# Patient Record
Sex: Male | Born: 1955 | Race: White | Hispanic: No | Marital: Single | Smoking: Former smoker
Health system: Southern US, Community
[De-identification: ages and names within clinical notes are randomized; demographics above are authoritative.]

## PROBLEM LIST (undated history)

## (undated) DIAGNOSIS — I2510 Atherosclerotic heart disease of native coronary artery without angina pectoris: Secondary | ICD-10-CM

## (undated) DIAGNOSIS — F32A Depression, unspecified: Secondary | ICD-10-CM

## (undated) DIAGNOSIS — I208 Other forms of angina pectoris: Secondary | ICD-10-CM

## (undated) DIAGNOSIS — I219 Acute myocardial infarction, unspecified: Secondary | ICD-10-CM

## (undated) DIAGNOSIS — R519 Headache, unspecified: Secondary | ICD-10-CM

## (undated) DIAGNOSIS — J189 Pneumonia, unspecified organism: Secondary | ICD-10-CM

## (undated) DIAGNOSIS — IMO0002 Reserved for concepts with insufficient information to code with codable children: Secondary | ICD-10-CM

## (undated) DIAGNOSIS — J439 Emphysema, unspecified: Secondary | ICD-10-CM

## (undated) DIAGNOSIS — H269 Unspecified cataract: Secondary | ICD-10-CM

## (undated) DIAGNOSIS — M1990 Unspecified osteoarthritis, unspecified site: Secondary | ICD-10-CM

## (undated) DIAGNOSIS — E119 Type 2 diabetes mellitus without complications: Secondary | ICD-10-CM

## (undated) DIAGNOSIS — K219 Gastro-esophageal reflux disease without esophagitis: Secondary | ICD-10-CM

## (undated) DIAGNOSIS — I10 Essential (primary) hypertension: Secondary | ICD-10-CM

## (undated) DIAGNOSIS — T7840XA Allergy, unspecified, initial encounter: Secondary | ICD-10-CM

## (undated) DIAGNOSIS — F329 Major depressive disorder, single episode, unspecified: Secondary | ICD-10-CM

## (undated) DIAGNOSIS — R51 Headache: Secondary | ICD-10-CM

## (undated) DIAGNOSIS — G629 Polyneuropathy, unspecified: Secondary | ICD-10-CM

## (undated) DIAGNOSIS — Z9289 Personal history of other medical treatment: Secondary | ICD-10-CM

## (undated) DIAGNOSIS — N200 Calculus of kidney: Secondary | ICD-10-CM

## (undated) DIAGNOSIS — Z8719 Personal history of other diseases of the digestive system: Secondary | ICD-10-CM

## (undated) DIAGNOSIS — M79673 Pain in unspecified foot: Secondary | ICD-10-CM

## (undated) DIAGNOSIS — J45909 Unspecified asthma, uncomplicated: Secondary | ICD-10-CM

## (undated) DIAGNOSIS — M069 Rheumatoid arthritis, unspecified: Secondary | ICD-10-CM

## (undated) DIAGNOSIS — I2089 Other forms of angina pectoris: Secondary | ICD-10-CM

## (undated) DIAGNOSIS — B20 Human immunodeficiency virus [HIV] disease: Secondary | ICD-10-CM

## (undated) DIAGNOSIS — F419 Anxiety disorder, unspecified: Secondary | ICD-10-CM

## (undated) DIAGNOSIS — E785 Hyperlipidemia, unspecified: Secondary | ICD-10-CM

## (undated) DIAGNOSIS — Z8739 Personal history of other diseases of the musculoskeletal system and connective tissue: Secondary | ICD-10-CM

## (undated) DIAGNOSIS — J449 Chronic obstructive pulmonary disease, unspecified: Secondary | ICD-10-CM

## (undated) DIAGNOSIS — G4730 Sleep apnea, unspecified: Secondary | ICD-10-CM

## (undated) HISTORY — DX: Major depressive disorder, single episode, unspecified: F32.9

## (undated) HISTORY — DX: Rheumatoid arthritis, unspecified: M06.9

## (undated) HISTORY — PX: HERNIA REPAIR: SHX51

## (undated) HISTORY — DX: Unspecified cataract: H26.9

## (undated) HISTORY — DX: Type 2 diabetes mellitus without complications: E11.9

## (undated) HISTORY — DX: Pain in unspecified foot: M79.673

## (undated) HISTORY — DX: Other forms of angina pectoris: I20.89

## (undated) HISTORY — DX: Emphysema, unspecified: J43.9

## (undated) HISTORY — DX: Human immunodeficiency virus (HIV) disease: B20

## (undated) HISTORY — DX: Allergy, unspecified, initial encounter: T78.40XA

## (undated) HISTORY — DX: Polyneuropathy, unspecified: G62.9

## (undated) HISTORY — PX: APPENDECTOMY: SHX54

## (undated) HISTORY — DX: Essential (primary) hypertension: I10

## (undated) HISTORY — DX: Depression, unspecified: F32.A

## (undated) HISTORY — DX: Other forms of angina pectoris: I20.8

## (undated) HISTORY — DX: Hyperlipidemia, unspecified: E78.5

## (undated) HISTORY — DX: Reserved for concepts with insufficient information to code with codable children: IMO0002

## (undated) HISTORY — DX: Personal history of other medical treatment: Z92.89

## (undated) HISTORY — DX: Acute myocardial infarction, unspecified: I21.9

---

## 1979-05-30 HISTORY — PX: UMBILICAL HERNIA REPAIR: SHX196

## 1989-05-29 HISTORY — PX: CARPAL TUNNEL RELEASE: SHX101

## 1989-05-29 HISTORY — PX: TONSILLECTOMY AND ADENOIDECTOMY: SUR1326

## 1989-05-29 HISTORY — PX: TRIGGER FINGER RELEASE: SHX641

## 1989-05-29 HISTORY — PX: LAPAROSCOPIC INCISIONAL / UMBILICAL / VENTRAL HERNIA REPAIR: SUR789

## 1989-05-29 HISTORY — PX: UVULOPALATOPHARYNGOPLASTY (UPPP)/TONSILLECTOMY/SEPTOPLASTY: SHX6164

## 1993-09-28 DIAGNOSIS — Z21 Asymptomatic human immunodeficiency virus [HIV] infection status: Secondary | ICD-10-CM

## 1993-09-28 DIAGNOSIS — B20 Human immunodeficiency virus [HIV] disease: Secondary | ICD-10-CM

## 1993-09-28 HISTORY — DX: Human immunodeficiency virus (HIV) disease: B20

## 1993-09-28 HISTORY — DX: Asymptomatic human immunodeficiency virus (hiv) infection status: Z21

## 1994-10-23 ENCOUNTER — Encounter (INDEPENDENT_AMBULATORY_CARE_PROVIDER_SITE_OTHER): Payer: Self-pay | Admitting: *Deleted

## 1994-10-23 ENCOUNTER — Encounter: Payer: Self-pay | Admitting: Infectious Diseases

## 1994-10-23 LAB — CONVERTED CEMR LAB
CD4 Count: 21 microliters
CD4 T Cell Abs: 21

## 1998-11-07 ENCOUNTER — Encounter: Admission: RE | Admit: 1998-11-07 | Discharge: 1998-11-07 | Payer: Self-pay | Admitting: Internal Medicine

## 1998-11-07 ENCOUNTER — Ambulatory Visit (HOSPITAL_COMMUNITY): Admission: RE | Admit: 1998-11-07 | Discharge: 1998-11-07 | Payer: Self-pay | Admitting: Internal Medicine

## 1998-12-16 ENCOUNTER — Encounter: Admission: RE | Admit: 1998-12-16 | Discharge: 1998-12-16 | Payer: Self-pay | Admitting: Internal Medicine

## 1999-04-03 ENCOUNTER — Ambulatory Visit (HOSPITAL_COMMUNITY): Admission: RE | Admit: 1999-04-03 | Discharge: 1999-04-03 | Payer: Self-pay | Admitting: Internal Medicine

## 1999-04-03 ENCOUNTER — Encounter: Admission: RE | Admit: 1999-04-03 | Discharge: 1999-04-03 | Payer: Self-pay | Admitting: Internal Medicine

## 1999-07-10 ENCOUNTER — Encounter: Admission: RE | Admit: 1999-07-10 | Discharge: 1999-07-10 | Payer: Self-pay | Admitting: Hematology and Oncology

## 1999-07-10 ENCOUNTER — Ambulatory Visit (HOSPITAL_COMMUNITY): Admission: RE | Admit: 1999-07-10 | Discharge: 1999-07-10 | Payer: Self-pay | Admitting: Internal Medicine

## 1999-08-07 ENCOUNTER — Encounter: Admission: RE | Admit: 1999-08-07 | Discharge: 1999-08-07 | Payer: Self-pay | Admitting: Internal Medicine

## 1999-08-11 ENCOUNTER — Ambulatory Visit (HOSPITAL_COMMUNITY): Admission: RE | Admit: 1999-08-11 | Discharge: 1999-08-11 | Payer: Self-pay | Admitting: *Deleted

## 1999-08-11 ENCOUNTER — Encounter: Payer: Self-pay | Admitting: *Deleted

## 1999-10-02 ENCOUNTER — Encounter: Admission: RE | Admit: 1999-10-02 | Discharge: 1999-10-02 | Payer: Self-pay | Admitting: Hematology and Oncology

## 1999-10-02 ENCOUNTER — Ambulatory Visit (HOSPITAL_COMMUNITY): Admission: RE | Admit: 1999-10-02 | Discharge: 1999-10-02 | Payer: Self-pay | Admitting: Hematology and Oncology

## 1999-11-13 ENCOUNTER — Encounter: Admission: RE | Admit: 1999-11-13 | Discharge: 1999-11-13 | Payer: Self-pay | Admitting: Internal Medicine

## 2000-01-23 ENCOUNTER — Encounter: Admission: RE | Admit: 2000-01-23 | Discharge: 2000-01-23 | Payer: Self-pay | Admitting: Internal Medicine

## 2000-01-23 ENCOUNTER — Ambulatory Visit (HOSPITAL_COMMUNITY): Admission: RE | Admit: 2000-01-23 | Discharge: 2000-01-23 | Payer: Self-pay | Admitting: Internal Medicine

## 2000-03-29 ENCOUNTER — Encounter: Admission: RE | Admit: 2000-03-29 | Discharge: 2000-03-29 | Payer: Self-pay

## 2000-05-10 ENCOUNTER — Ambulatory Visit (HOSPITAL_COMMUNITY): Admission: RE | Admit: 2000-05-10 | Discharge: 2000-05-10 | Payer: Self-pay | Admitting: Internal Medicine

## 2000-05-10 ENCOUNTER — Encounter: Admission: RE | Admit: 2000-05-10 | Discharge: 2000-05-10 | Payer: Self-pay | Admitting: Internal Medicine

## 2000-09-02 ENCOUNTER — Ambulatory Visit (HOSPITAL_COMMUNITY): Admission: RE | Admit: 2000-09-02 | Discharge: 2000-09-02 | Payer: Self-pay | Admitting: Hematology and Oncology

## 2000-09-02 ENCOUNTER — Encounter: Admission: RE | Admit: 2000-09-02 | Discharge: 2000-09-02 | Payer: Self-pay | Admitting: Hematology and Oncology

## 2001-01-03 ENCOUNTER — Encounter: Admission: RE | Admit: 2001-01-03 | Discharge: 2001-01-03 | Payer: Self-pay | Admitting: Internal Medicine

## 2001-01-03 ENCOUNTER — Ambulatory Visit (HOSPITAL_COMMUNITY): Admission: RE | Admit: 2001-01-03 | Discharge: 2001-01-03 | Payer: Self-pay | Admitting: Internal Medicine

## 2001-05-11 ENCOUNTER — Encounter: Admission: RE | Admit: 2001-05-11 | Discharge: 2001-05-11 | Payer: Self-pay | Admitting: Internal Medicine

## 2001-05-11 ENCOUNTER — Ambulatory Visit (HOSPITAL_COMMUNITY): Admission: RE | Admit: 2001-05-11 | Discharge: 2001-05-11 | Payer: Self-pay | Admitting: Internal Medicine

## 2001-06-10 ENCOUNTER — Encounter: Admission: RE | Admit: 2001-06-10 | Discharge: 2001-06-10 | Payer: Self-pay | Admitting: Internal Medicine

## 2001-09-05 ENCOUNTER — Encounter: Admission: RE | Admit: 2001-09-05 | Discharge: 2001-09-05 | Payer: Self-pay | Admitting: Internal Medicine

## 2001-11-03 ENCOUNTER — Ambulatory Visit (HOSPITAL_COMMUNITY): Admission: RE | Admit: 2001-11-03 | Discharge: 2001-11-03 | Payer: Self-pay | Admitting: Internal Medicine

## 2001-11-03 ENCOUNTER — Encounter: Admission: RE | Admit: 2001-11-03 | Discharge: 2001-11-03 | Payer: Self-pay | Admitting: Internal Medicine

## 2001-11-10 ENCOUNTER — Ambulatory Visit (HOSPITAL_COMMUNITY): Admission: RE | Admit: 2001-11-10 | Discharge: 2001-11-10 | Payer: Self-pay | Admitting: Cardiovascular Disease

## 2001-11-10 ENCOUNTER — Encounter: Payer: Self-pay | Admitting: Cardiovascular Disease

## 2001-12-23 ENCOUNTER — Encounter: Payer: Self-pay | Admitting: Surgery

## 2001-12-23 ENCOUNTER — Ambulatory Visit (HOSPITAL_COMMUNITY): Admission: RE | Admit: 2001-12-23 | Discharge: 2001-12-23 | Payer: Self-pay | Admitting: Surgery

## 2001-12-29 ENCOUNTER — Encounter: Admission: RE | Admit: 2001-12-29 | Discharge: 2001-12-29 | Payer: Self-pay

## 2002-01-25 ENCOUNTER — Encounter: Payer: Self-pay | Admitting: Surgery

## 2002-01-30 ENCOUNTER — Ambulatory Visit (HOSPITAL_COMMUNITY): Admission: RE | Admit: 2002-01-30 | Discharge: 2002-01-30 | Payer: Self-pay | Admitting: Surgery

## 2002-05-16 ENCOUNTER — Encounter: Payer: Self-pay | Admitting: Internal Medicine

## 2002-05-16 ENCOUNTER — Encounter: Admission: RE | Admit: 2002-05-16 | Discharge: 2002-05-16 | Payer: Self-pay | Admitting: Internal Medicine

## 2002-05-16 ENCOUNTER — Ambulatory Visit (HOSPITAL_COMMUNITY): Admission: RE | Admit: 2002-05-16 | Discharge: 2002-05-16 | Payer: Self-pay | Admitting: Internal Medicine

## 2002-09-12 ENCOUNTER — Encounter: Admission: RE | Admit: 2002-09-12 | Discharge: 2002-09-12 | Payer: Self-pay | Admitting: Internal Medicine

## 2002-09-12 ENCOUNTER — Ambulatory Visit (HOSPITAL_COMMUNITY): Admission: RE | Admit: 2002-09-12 | Discharge: 2002-09-12 | Payer: Self-pay | Admitting: Internal Medicine

## 2002-12-11 ENCOUNTER — Encounter: Admission: RE | Admit: 2002-12-11 | Discharge: 2002-12-11 | Payer: Self-pay | Admitting: Internal Medicine

## 2002-12-25 ENCOUNTER — Encounter: Admission: RE | Admit: 2002-12-25 | Discharge: 2002-12-25 | Payer: Self-pay | Admitting: Infectious Diseases

## 2003-02-12 ENCOUNTER — Encounter: Admission: RE | Admit: 2003-02-12 | Discharge: 2003-02-12 | Payer: Self-pay | Admitting: Infectious Diseases

## 2003-06-06 ENCOUNTER — Encounter (INDEPENDENT_AMBULATORY_CARE_PROVIDER_SITE_OTHER): Payer: Self-pay | Admitting: Infectious Diseases

## 2003-06-06 ENCOUNTER — Ambulatory Visit (HOSPITAL_COMMUNITY): Admission: RE | Admit: 2003-06-06 | Discharge: 2003-06-06 | Payer: Self-pay | Admitting: Infectious Diseases

## 2003-06-06 ENCOUNTER — Encounter: Admission: RE | Admit: 2003-06-06 | Discharge: 2003-06-06 | Payer: Self-pay | Admitting: Infectious Diseases

## 2003-06-27 ENCOUNTER — Encounter (INDEPENDENT_AMBULATORY_CARE_PROVIDER_SITE_OTHER): Payer: Self-pay | Admitting: Infectious Diseases

## 2003-06-27 ENCOUNTER — Encounter: Admission: RE | Admit: 2003-06-27 | Discharge: 2003-06-27 | Payer: Self-pay | Admitting: Infectious Diseases

## 2003-06-27 ENCOUNTER — Ambulatory Visit (HOSPITAL_COMMUNITY): Admission: RE | Admit: 2003-06-27 | Discharge: 2003-06-27 | Payer: Self-pay | Admitting: Infectious Diseases

## 2003-07-17 ENCOUNTER — Encounter: Admission: RE | Admit: 2003-07-17 | Discharge: 2003-07-17 | Payer: Self-pay | Admitting: Infectious Diseases

## 2003-08-15 ENCOUNTER — Encounter: Admission: RE | Admit: 2003-08-15 | Discharge: 2003-08-15 | Payer: Self-pay | Admitting: Internal Medicine

## 2003-08-15 ENCOUNTER — Ambulatory Visit (HOSPITAL_COMMUNITY): Admission: RE | Admit: 2003-08-15 | Discharge: 2003-08-15 | Payer: Self-pay | Admitting: Internal Medicine

## 2003-09-07 ENCOUNTER — Encounter (INDEPENDENT_AMBULATORY_CARE_PROVIDER_SITE_OTHER): Payer: Self-pay | Admitting: Infectious Diseases

## 2003-09-07 ENCOUNTER — Ambulatory Visit (HOSPITAL_COMMUNITY): Admission: RE | Admit: 2003-09-07 | Discharge: 2003-09-07 | Payer: Self-pay | Admitting: Infectious Diseases

## 2003-09-07 ENCOUNTER — Encounter: Admission: RE | Admit: 2003-09-07 | Discharge: 2003-09-07 | Payer: Self-pay | Admitting: Infectious Diseases

## 2003-09-19 ENCOUNTER — Encounter: Admission: RE | Admit: 2003-09-19 | Discharge: 2003-09-19 | Payer: Self-pay | Admitting: Infectious Diseases

## 2003-11-27 ENCOUNTER — Ambulatory Visit (HOSPITAL_COMMUNITY): Admission: RE | Admit: 2003-11-27 | Discharge: 2003-11-27 | Payer: Self-pay | Admitting: Infectious Diseases

## 2003-11-27 ENCOUNTER — Encounter: Admission: RE | Admit: 2003-11-27 | Discharge: 2003-11-27 | Payer: Self-pay | Admitting: Infectious Diseases

## 2003-12-11 ENCOUNTER — Encounter: Admission: RE | Admit: 2003-12-11 | Discharge: 2003-12-11 | Payer: Self-pay | Admitting: Infectious Diseases

## 2004-03-10 ENCOUNTER — Encounter: Admission: RE | Admit: 2004-03-10 | Discharge: 2004-03-10 | Payer: Self-pay | Admitting: Infectious Diseases

## 2004-03-10 ENCOUNTER — Ambulatory Visit (HOSPITAL_COMMUNITY): Admission: RE | Admit: 2004-03-10 | Discharge: 2004-03-10 | Payer: Self-pay | Admitting: Infectious Diseases

## 2004-03-18 ENCOUNTER — Encounter: Admission: RE | Admit: 2004-03-18 | Discharge: 2004-06-16 | Payer: Self-pay | Admitting: Infectious Diseases

## 2004-04-02 ENCOUNTER — Encounter: Admission: RE | Admit: 2004-04-02 | Discharge: 2004-04-02 | Payer: Self-pay | Admitting: Infectious Diseases

## 2004-04-23 ENCOUNTER — Encounter: Admission: RE | Admit: 2004-04-23 | Discharge: 2004-04-23 | Payer: Self-pay | Admitting: Infectious Diseases

## 2004-04-23 ENCOUNTER — Ambulatory Visit (HOSPITAL_COMMUNITY): Admission: RE | Admit: 2004-04-23 | Discharge: 2004-04-23 | Payer: Self-pay | Admitting: Infectious Diseases

## 2004-05-05 ENCOUNTER — Encounter: Admission: RE | Admit: 2004-05-05 | Discharge: 2004-05-05 | Payer: Self-pay | Admitting: Internal Medicine

## 2004-05-12 ENCOUNTER — Encounter: Admission: RE | Admit: 2004-05-12 | Discharge: 2004-05-12 | Payer: Self-pay | Admitting: Internal Medicine

## 2004-05-13 ENCOUNTER — Encounter: Admission: RE | Admit: 2004-05-13 | Discharge: 2004-05-13 | Payer: Self-pay | Admitting: Internal Medicine

## 2004-06-23 ENCOUNTER — Ambulatory Visit (HOSPITAL_COMMUNITY): Admission: RE | Admit: 2004-06-23 | Discharge: 2004-06-23 | Payer: Self-pay | Admitting: Infectious Diseases

## 2004-06-23 ENCOUNTER — Ambulatory Visit: Payer: Self-pay | Admitting: Infectious Diseases

## 2004-07-21 ENCOUNTER — Ambulatory Visit: Payer: Self-pay | Admitting: Infectious Diseases

## 2004-09-26 ENCOUNTER — Emergency Department (HOSPITAL_COMMUNITY): Admission: EM | Admit: 2004-09-26 | Discharge: 2004-09-26 | Payer: Self-pay | Admitting: Family Medicine

## 2004-09-30 ENCOUNTER — Ambulatory Visit: Payer: Self-pay | Admitting: Infectious Diseases

## 2004-09-30 ENCOUNTER — Ambulatory Visit (HOSPITAL_COMMUNITY): Admission: RE | Admit: 2004-09-30 | Discharge: 2004-09-30 | Payer: Self-pay | Admitting: Infectious Diseases

## 2004-10-13 ENCOUNTER — Ambulatory Visit: Payer: Self-pay | Admitting: Infectious Diseases

## 2005-01-05 ENCOUNTER — Ambulatory Visit (HOSPITAL_COMMUNITY): Admission: RE | Admit: 2005-01-05 | Discharge: 2005-01-05 | Payer: Self-pay | Admitting: Infectious Diseases

## 2005-01-05 ENCOUNTER — Ambulatory Visit: Payer: Self-pay | Admitting: Infectious Diseases

## 2005-02-09 ENCOUNTER — Ambulatory Visit: Payer: Self-pay | Admitting: Infectious Diseases

## 2005-03-11 ENCOUNTER — Ambulatory Visit: Payer: Self-pay | Admitting: Internal Medicine

## 2005-04-13 ENCOUNTER — Ambulatory Visit: Payer: Self-pay | Admitting: Internal Medicine

## 2005-04-13 ENCOUNTER — Ambulatory Visit (HOSPITAL_COMMUNITY): Admission: RE | Admit: 2005-04-13 | Discharge: 2005-04-13 | Payer: Self-pay | Admitting: Internal Medicine

## 2005-04-20 ENCOUNTER — Ambulatory Visit: Payer: Self-pay | Admitting: Internal Medicine

## 2005-05-25 ENCOUNTER — Ambulatory Visit: Payer: Self-pay | Admitting: Infectious Diseases

## 2005-05-25 ENCOUNTER — Ambulatory Visit (HOSPITAL_COMMUNITY): Admission: RE | Admit: 2005-05-25 | Discharge: 2005-05-25 | Payer: Self-pay | Admitting: Infectious Diseases

## 2005-06-15 ENCOUNTER — Ambulatory Visit: Payer: Self-pay | Admitting: Infectious Diseases

## 2005-08-06 ENCOUNTER — Ambulatory Visit: Payer: Self-pay | Admitting: Internal Medicine

## 2005-08-06 ENCOUNTER — Ambulatory Visit (HOSPITAL_COMMUNITY): Admission: RE | Admit: 2005-08-06 | Discharge: 2005-08-06 | Payer: Self-pay | Admitting: Internal Medicine

## 2005-08-26 ENCOUNTER — Ambulatory Visit: Payer: Self-pay | Admitting: Internal Medicine

## 2005-09-09 ENCOUNTER — Ambulatory Visit (HOSPITAL_COMMUNITY): Admission: RE | Admit: 2005-09-09 | Discharge: 2005-09-09 | Payer: Self-pay | Admitting: Internal Medicine

## 2005-09-09 ENCOUNTER — Ambulatory Visit: Payer: Self-pay | Admitting: Internal Medicine

## 2005-10-01 ENCOUNTER — Ambulatory Visit: Payer: Self-pay | Admitting: Infectious Diseases

## 2005-10-19 ENCOUNTER — Ambulatory Visit: Payer: Self-pay | Admitting: Infectious Diseases

## 2005-11-24 ENCOUNTER — Encounter (INDEPENDENT_AMBULATORY_CARE_PROVIDER_SITE_OTHER): Payer: Self-pay | Admitting: *Deleted

## 2005-11-24 ENCOUNTER — Ambulatory Visit (HOSPITAL_BASED_OUTPATIENT_CLINIC_OR_DEPARTMENT_OTHER): Admission: RE | Admit: 2005-11-24 | Discharge: 2005-11-24 | Payer: Self-pay | Admitting: Orthopedic Surgery

## 2005-12-11 ENCOUNTER — Ambulatory Visit: Payer: Self-pay | Admitting: Internal Medicine

## 2005-12-18 ENCOUNTER — Ambulatory Visit: Payer: Self-pay | Admitting: Internal Medicine

## 2005-12-25 ENCOUNTER — Ambulatory Visit: Payer: Self-pay | Admitting: Internal Medicine

## 2006-01-05 ENCOUNTER — Ambulatory Visit: Payer: Self-pay | Admitting: Infectious Diseases

## 2006-01-05 ENCOUNTER — Encounter: Admission: RE | Admit: 2006-01-05 | Discharge: 2006-01-05 | Payer: Self-pay | Admitting: Infectious Diseases

## 2006-01-05 ENCOUNTER — Encounter (INDEPENDENT_AMBULATORY_CARE_PROVIDER_SITE_OTHER): Payer: Self-pay | Admitting: *Deleted

## 2006-01-05 LAB — CONVERTED CEMR LAB: CD4 Count: 450 microliters

## 2006-01-18 ENCOUNTER — Ambulatory Visit: Payer: Self-pay | Admitting: Infectious Diseases

## 2006-03-02 ENCOUNTER — Ambulatory Visit: Payer: Self-pay | Admitting: Internal Medicine

## 2006-03-30 ENCOUNTER — Ambulatory Visit: Payer: Self-pay | Admitting: Internal Medicine

## 2006-05-18 ENCOUNTER — Ambulatory Visit: Payer: Self-pay | Admitting: Infectious Diseases

## 2006-05-18 ENCOUNTER — Encounter (INDEPENDENT_AMBULATORY_CARE_PROVIDER_SITE_OTHER): Payer: Self-pay | Admitting: *Deleted

## 2006-05-18 ENCOUNTER — Encounter: Admission: RE | Admit: 2006-05-18 | Discharge: 2006-05-18 | Payer: Self-pay | Admitting: Infectious Diseases

## 2006-05-18 LAB — CONVERTED CEMR LAB
CD4 Count: 590 microliters
HIV 1 RNA Quant: 681 copies/mL

## 2006-06-07 ENCOUNTER — Ambulatory Visit: Payer: Self-pay | Admitting: Infectious Diseases

## 2006-06-07 ENCOUNTER — Encounter (INDEPENDENT_AMBULATORY_CARE_PROVIDER_SITE_OTHER): Payer: Self-pay | Admitting: *Deleted

## 2006-06-07 LAB — CONVERTED CEMR LAB: HIV 1 RNA Quant: 129 copies/mL

## 2006-06-24 ENCOUNTER — Ambulatory Visit: Payer: Self-pay | Admitting: Infectious Diseases

## 2006-07-08 DIAGNOSIS — I10 Essential (primary) hypertension: Secondary | ICD-10-CM | POA: Insufficient documentation

## 2006-07-08 DIAGNOSIS — F329 Major depressive disorder, single episode, unspecified: Secondary | ICD-10-CM

## 2006-07-08 DIAGNOSIS — F32A Depression, unspecified: Secondary | ICD-10-CM | POA: Insufficient documentation

## 2006-07-08 DIAGNOSIS — B20 Human immunodeficiency virus [HIV] disease: Secondary | ICD-10-CM | POA: Insufficient documentation

## 2006-07-08 DIAGNOSIS — E1169 Type 2 diabetes mellitus with other specified complication: Secondary | ICD-10-CM

## 2006-07-08 DIAGNOSIS — G5600 Carpal tunnel syndrome, unspecified upper limb: Secondary | ICD-10-CM | POA: Insufficient documentation

## 2006-07-08 DIAGNOSIS — E119 Type 2 diabetes mellitus without complications: Secondary | ICD-10-CM | POA: Insufficient documentation

## 2006-07-08 DIAGNOSIS — E1142 Type 2 diabetes mellitus with diabetic polyneuropathy: Secondary | ICD-10-CM | POA: Insufficient documentation

## 2006-07-12 ENCOUNTER — Ambulatory Visit: Payer: Self-pay | Admitting: Infectious Diseases

## 2006-08-05 ENCOUNTER — Ambulatory Visit: Payer: Self-pay | Admitting: Infectious Diseases

## 2006-08-05 LAB — CONVERTED CEMR LAB
ALT: 85 units/L — ABNORMAL HIGH (ref 0–53)
AST: 37 units/L (ref 0–37)
Albumin: 4.4 g/dL (ref 3.5–5.2)
Alkaline Phosphatase: 95 units/L (ref 39–117)
BUN: 15 mg/dL (ref 6–23)
CO2: 28 meq/L (ref 19–32)
Calcium: 9.6 mg/dL (ref 8.4–10.5)
Chloride: 99 meq/L (ref 96–112)
Creatinine, Ser: 0.9 mg/dL (ref 0.40–1.50)
Glucose, Bld: 136 mg/dL — ABNORMAL HIGH (ref 70–99)
HCT: 43.1 % (ref 41.0–49.0)
Hemoglobin: 14.8 g/dL (ref 13.9–16.8)
Leukocyte count, blood: 6.8 10*9/L (ref 3.7–10.0)
MCHC: 34.3 g/dL (ref 33.1–35.4)
MCV: 96.2 fL (ref 78.8–100.0)
Platelets: 302 10*3/uL (ref 152–374)
Potassium: 4.3 meq/L (ref 3.5–5.3)
RBC: 4.48 M/uL (ref 4.20–5.50)
RDW: 13.9 % (ref 11.5–15.3)
Sodium: 139 meq/L (ref 135–145)
Total Bilirubin: 0.5 mg/dL (ref 0.3–1.2)
Total Protein: 6.9 g/dL (ref 6.0–8.3)

## 2006-08-11 ENCOUNTER — Ambulatory Visit: Payer: Self-pay | Admitting: Hospitalist

## 2006-09-24 ENCOUNTER — Ambulatory Visit: Payer: Self-pay | Admitting: Internal Medicine

## 2006-09-30 ENCOUNTER — Encounter (INDEPENDENT_AMBULATORY_CARE_PROVIDER_SITE_OTHER): Payer: Self-pay | Admitting: Infectious Diseases

## 2006-10-11 DIAGNOSIS — E7800 Pure hypercholesterolemia, unspecified: Secondary | ICD-10-CM | POA: Insufficient documentation

## 2006-10-11 DIAGNOSIS — M069 Rheumatoid arthritis, unspecified: Secondary | ICD-10-CM | POA: Insufficient documentation

## 2006-10-12 ENCOUNTER — Encounter: Admission: RE | Admit: 2006-10-12 | Discharge: 2006-10-12 | Payer: Self-pay | Admitting: Infectious Diseases

## 2006-10-12 ENCOUNTER — Encounter (INDEPENDENT_AMBULATORY_CARE_PROVIDER_SITE_OTHER): Payer: Self-pay | Admitting: *Deleted

## 2006-10-12 ENCOUNTER — Ambulatory Visit: Payer: Self-pay | Admitting: Infectious Diseases

## 2006-10-12 ENCOUNTER — Ambulatory Visit: Payer: Self-pay | Admitting: Internal Medicine

## 2006-10-12 DIAGNOSIS — M546 Pain in thoracic spine: Secondary | ICD-10-CM | POA: Insufficient documentation

## 2006-10-12 DIAGNOSIS — F17200 Nicotine dependence, unspecified, uncomplicated: Secondary | ICD-10-CM | POA: Insufficient documentation

## 2006-10-12 LAB — CONVERTED CEMR LAB
ALT: 54 units/L — ABNORMAL HIGH (ref 0–53)
AST: 30 units/L (ref 0–37)
Albumin: 4.3 g/dL (ref 3.5–5.2)
Alkaline Phosphatase: 72 units/L (ref 39–117)
BUN: 13 mg/dL (ref 6–23)
Basophils Absolute: 0 10*3/uL (ref 0.0–0.1)
Basophils Relative: 1 % (ref 0–1)
CD4 Count: 550 microliters
CO2: 24 meq/L (ref 19–32)
Calcium: 9.3 mg/dL (ref 8.4–10.5)
Chloride: 107 meq/L (ref 96–112)
Cholesterol: 180 mg/dL (ref 0–200)
Creatinine, Ser: 0.79 mg/dL (ref 0.40–1.50)
Creatinine, Urine: 193.2 mg/dL
Eosinophils Relative: 3 % (ref 0–5)
Glucose, Bld: 89 mg/dL
Glucose, Bld: 89 mg/dL (ref 70–99)
HCT: 44.3 % (ref 39.0–52.0)
HDL: 46 mg/dL (ref >39)
HIV 1 RNA Quant: 273 copies/mL
HIV 1 RNA Quant: 273 copies/mL — ABNORMAL HIGH (ref <50)
HIV-1 RNA Quant, Log: 2.44 — ABNORMAL HIGH (ref <1.70)
Hemoglobin, Urine: NEGATIVE
Hemoglobin: 14.6 g/dL (ref 13.0–17.0)
Hgb A1c MFr Bld: 5.7 %
LDL Cholesterol: 111 mg/dL — ABNORMAL HIGH (ref 0–99)
Leukocytes, UA: NEGATIVE
Lymphocytes Relative: 25 % (ref 12–46)
Lymphs Abs: 1.6 10*3/uL (ref 0.7–3.3)
MCHC: 33 g/dL (ref 30.0–36.0)
MCV: 98.2 fL (ref 78.0–100.0)
Microalb Creat Ratio: 4.4 mg/g (ref 0.0–30.0)
Microalb, Ur: 0.85 mg/dL (ref 0.00–1.89)
Monocytes Absolute: 0.5 10*3/uL (ref 0.2–0.7)
Monocytes Relative: 8 % (ref 3–11)
Neutro Abs: 4.1 10*3/uL (ref 1.7–7.7)
Neutrophils Relative %: 64 % (ref 43–77)
Nitrite: NEGATIVE
PSA: 0.5 ng/mL (ref 0.10–4.00)
Platelets: 298 10*3/uL (ref 150–400)
Potassium: 4 meq/L (ref 3.5–5.3)
Protein, ur: NEGATIVE mg/dL
RBC: 4.51 M/uL (ref 4.22–5.81)
RDW: 13.7 % (ref 11.5–14.0)
Sodium: 143 meq/L (ref 135–145)
Specific Gravity, Urine: 1.027 (ref 1.005–1.03)
TSH: 0.564 microintl units/mL (ref 0.350–5.50)
Testosterone: 344.85 ng/dL — ABNORMAL LOW (ref 350–890)
Total Bilirubin: 0.6 mg/dL (ref 0.3–1.2)
Total CHOL/HDL Ratio: 3.9
Total Protein: 6.6 g/dL (ref 6.0–8.3)
Triglycerides: 115 mg/dL (ref <150)
Urine Glucose: NEGATIVE mg/dL
Urobilinogen, UA: 1 (ref 0.0–1.0)
VLDL: 23 mg/dL (ref 0–40)
WBC: 6.4 10*3/uL (ref 4.0–10.5)
pH: 6.5 (ref 5.0–8.0)

## 2006-10-25 ENCOUNTER — Ambulatory Visit: Payer: Self-pay | Admitting: Infectious Diseases

## 2006-11-22 ENCOUNTER — Encounter (INDEPENDENT_AMBULATORY_CARE_PROVIDER_SITE_OTHER): Payer: Self-pay | Admitting: *Deleted

## 2006-11-22 LAB — CONVERTED CEMR LAB

## 2006-11-30 ENCOUNTER — Encounter (INDEPENDENT_AMBULATORY_CARE_PROVIDER_SITE_OTHER): Payer: Self-pay | Admitting: *Deleted

## 2006-12-05 ENCOUNTER — Encounter (INDEPENDENT_AMBULATORY_CARE_PROVIDER_SITE_OTHER): Payer: Self-pay | Admitting: *Deleted

## 2006-12-28 ENCOUNTER — Telehealth (INDEPENDENT_AMBULATORY_CARE_PROVIDER_SITE_OTHER): Payer: Self-pay | Admitting: Infectious Diseases

## 2007-01-04 ENCOUNTER — Telehealth (INDEPENDENT_AMBULATORY_CARE_PROVIDER_SITE_OTHER): Payer: Self-pay | Admitting: *Deleted

## 2007-01-19 ENCOUNTER — Ambulatory Visit: Payer: Self-pay | Admitting: Hospitalist

## 2007-01-19 DIAGNOSIS — T148 Other injury of unspecified body region: Secondary | ICD-10-CM | POA: Insufficient documentation

## 2007-01-19 DIAGNOSIS — W57XXXA Bitten or stung by nonvenomous insect and other nonvenomous arthropods, initial encounter: Secondary | ICD-10-CM

## 2007-01-19 LAB — CONVERTED CEMR LAB
Blood Glucose, Fingerstick: 100
Hgb A1c MFr Bld: 5.7 %

## 2007-01-27 ENCOUNTER — Telehealth (INDEPENDENT_AMBULATORY_CARE_PROVIDER_SITE_OTHER): Payer: Self-pay | Admitting: Infectious Diseases

## 2007-02-11 ENCOUNTER — Telehealth (INDEPENDENT_AMBULATORY_CARE_PROVIDER_SITE_OTHER): Payer: Self-pay | Admitting: Infectious Diseases

## 2007-02-16 ENCOUNTER — Encounter: Admission: RE | Admit: 2007-02-16 | Discharge: 2007-02-16 | Payer: Self-pay | Admitting: Infectious Diseases

## 2007-02-16 ENCOUNTER — Ambulatory Visit: Payer: Self-pay | Admitting: Infectious Diseases

## 2007-02-28 ENCOUNTER — Telehealth (INDEPENDENT_AMBULATORY_CARE_PROVIDER_SITE_OTHER): Payer: Self-pay | Admitting: Infectious Diseases

## 2007-03-02 ENCOUNTER — Ambulatory Visit: Payer: Self-pay | Admitting: Infectious Diseases

## 2007-03-08 ENCOUNTER — Telehealth (INDEPENDENT_AMBULATORY_CARE_PROVIDER_SITE_OTHER): Payer: Self-pay | Admitting: Infectious Diseases

## 2007-03-24 ENCOUNTER — Telehealth (INDEPENDENT_AMBULATORY_CARE_PROVIDER_SITE_OTHER): Payer: Self-pay | Admitting: *Deleted

## 2007-04-11 ENCOUNTER — Telehealth: Payer: Self-pay | Admitting: Internal Medicine

## 2007-04-22 ENCOUNTER — Telehealth: Payer: Self-pay | Admitting: Internal Medicine

## 2007-05-10 ENCOUNTER — Telehealth: Payer: Self-pay | Admitting: Internal Medicine

## 2007-06-10 ENCOUNTER — Telehealth: Payer: Self-pay | Admitting: Internal Medicine

## 2007-06-24 ENCOUNTER — Telehealth: Payer: Self-pay | Admitting: Internal Medicine

## 2007-07-05 ENCOUNTER — Encounter (INDEPENDENT_AMBULATORY_CARE_PROVIDER_SITE_OTHER): Payer: Self-pay | Admitting: *Deleted

## 2007-07-05 ENCOUNTER — Encounter: Admission: RE | Admit: 2007-07-05 | Discharge: 2007-07-05 | Payer: Self-pay | Admitting: Infectious Diseases

## 2007-07-05 ENCOUNTER — Ambulatory Visit: Payer: Self-pay | Admitting: Infectious Diseases

## 2007-07-05 LAB — CONVERTED CEMR LAB
BUN: 14 mg/dL (ref 6–23)
CO2: 22 meq/L (ref 19–32)
Creatinine, Ser: 0.83 mg/dL (ref 0.40–1.50)
Eosinophils Absolute: 0.3 10*3/uL (ref 0.0–0.7)
Eosinophils Relative: 4 % (ref 0–5)
Glucose, Bld: 134 mg/dL — ABNORMAL HIGH (ref 70–99)
HCT: 45.9 % (ref 39.0–52.0)
HIV 1 RNA Quant: 280 copies/mL — ABNORMAL HIGH (ref <50)
HIV-1 RNA Quant, Log: 2.45 — ABNORMAL HIGH (ref <1.70)
Hemoglobin: 15.3 g/dL (ref 13.0–17.0)
Hgb A1c MFr Bld: 5.4 %
Lymphocytes Relative: 23 % (ref 12–46)
Lymphs Abs: 1.5 10*3/uL (ref 0.7–3.3)
MCV: 95.6 fL (ref 78.0–100.0)
Monocytes Absolute: 0.4 10*3/uL (ref 0.2–0.7)
Monocytes Relative: 7 % (ref 3–11)
Total Bilirubin: 0.6 mg/dL (ref 0.3–1.2)
Total Protein: 6.5 g/dL (ref 6.0–8.3)
WBC: 6.7 10*3/uL (ref 4.0–10.5)

## 2007-07-07 ENCOUNTER — Telehealth: Payer: Self-pay | Admitting: Internal Medicine

## 2007-07-13 ENCOUNTER — Encounter (INDEPENDENT_AMBULATORY_CARE_PROVIDER_SITE_OTHER): Payer: Self-pay | Admitting: *Deleted

## 2007-07-19 ENCOUNTER — Encounter (INDEPENDENT_AMBULATORY_CARE_PROVIDER_SITE_OTHER): Payer: Self-pay | Admitting: *Deleted

## 2007-07-19 ENCOUNTER — Ambulatory Visit: Payer: Self-pay | Admitting: Infectious Diseases

## 2007-07-19 DIAGNOSIS — E291 Testicular hypofunction: Secondary | ICD-10-CM | POA: Insufficient documentation

## 2007-07-19 DIAGNOSIS — L509 Urticaria, unspecified: Secondary | ICD-10-CM | POA: Insufficient documentation

## 2007-07-19 DIAGNOSIS — M19019 Primary osteoarthritis, unspecified shoulder: Secondary | ICD-10-CM | POA: Insufficient documentation

## 2007-08-08 ENCOUNTER — Telehealth: Payer: Self-pay | Admitting: Infectious Diseases

## 2007-08-18 ENCOUNTER — Telehealth: Payer: Self-pay | Admitting: Infectious Diseases

## 2007-09-07 ENCOUNTER — Ambulatory Visit: Payer: Self-pay | Admitting: Internal Medicine

## 2007-09-09 ENCOUNTER — Telehealth: Payer: Self-pay | Admitting: Infectious Diseases

## 2007-09-26 ENCOUNTER — Encounter (INDEPENDENT_AMBULATORY_CARE_PROVIDER_SITE_OTHER): Payer: Self-pay | Admitting: *Deleted

## 2007-09-26 ENCOUNTER — Telehealth (INDEPENDENT_AMBULATORY_CARE_PROVIDER_SITE_OTHER): Payer: Self-pay | Admitting: *Deleted

## 2007-09-27 ENCOUNTER — Encounter (INDEPENDENT_AMBULATORY_CARE_PROVIDER_SITE_OTHER): Payer: Self-pay | Admitting: *Deleted

## 2007-10-05 ENCOUNTER — Telehealth: Payer: Self-pay | Admitting: Infectious Diseases

## 2007-10-14 ENCOUNTER — Telehealth: Payer: Self-pay | Admitting: Infectious Diseases

## 2007-10-17 ENCOUNTER — Telehealth: Payer: Self-pay | Admitting: Infectious Diseases

## 2007-11-03 ENCOUNTER — Encounter (INDEPENDENT_AMBULATORY_CARE_PROVIDER_SITE_OTHER): Payer: Self-pay | Admitting: *Deleted

## 2007-11-03 ENCOUNTER — Ambulatory Visit: Payer: Self-pay | Admitting: Hospitalist

## 2007-11-03 LAB — CONVERTED CEMR LAB
AST: 26 units/L (ref 0–37)
Albumin: 4.5 g/dL (ref 3.5–5.2)
Alkaline Phosphatase: 102 units/L (ref 39–117)
Blood Glucose, Fingerstick: 121
Eosinophils Absolute: 0.2 10*3/uL (ref 0.0–0.7)
Glucose, Bld: 120 mg/dL — ABNORMAL HIGH (ref 70–99)
Hgb A1c MFr Bld: 6.2 %
LDL Cholesterol: 142 mg/dL — ABNORMAL HIGH (ref 0–99)
Lymphs Abs: 1.7 10*3/uL (ref 0.7–4.0)
MCV: 94.8 fL (ref 78.0–100.0)
Neutro Abs: 5.4 10*3/uL (ref 1.7–7.7)
Neutrophils Relative %: 67 % (ref 43–77)
Platelets: 189 10*3/uL (ref 150–400)
Potassium: 4.7 meq/L (ref 3.5–5.3)
Sodium: 138 meq/L (ref 135–145)
TSH: 1.319 microintl units/mL (ref 0.350–5.50)
Total Protein: 7.2 g/dL (ref 6.0–8.3)
Triglycerides: 182 mg/dL — ABNORMAL HIGH (ref <150)
WBC: 8 10*3/uL (ref 4.0–10.5)

## 2007-11-15 ENCOUNTER — Telehealth: Payer: Self-pay | Admitting: Infectious Diseases

## 2007-11-16 ENCOUNTER — Ambulatory Visit: Payer: Self-pay | Admitting: Internal Medicine

## 2007-11-16 ENCOUNTER — Telehealth: Payer: Self-pay | Admitting: Internal Medicine

## 2007-11-16 DIAGNOSIS — J209 Acute bronchitis, unspecified: Secondary | ICD-10-CM | POA: Insufficient documentation

## 2007-12-05 ENCOUNTER — Encounter: Admission: RE | Admit: 2007-12-05 | Discharge: 2007-12-05 | Payer: Self-pay | Admitting: Infectious Diseases

## 2007-12-05 ENCOUNTER — Ambulatory Visit: Payer: Self-pay | Admitting: Infectious Diseases

## 2007-12-05 LAB — CONVERTED CEMR LAB
AST: 23 units/L (ref 0–37)
Albumin: 4.6 g/dL (ref 3.5–5.2)
BUN: 11 mg/dL (ref 6–23)
Calcium: 9.4 mg/dL (ref 8.4–10.5)
Chloride: 108 meq/L (ref 96–112)
Glucose, Bld: 97 mg/dL (ref 70–99)
HDL: 41 mg/dL (ref >39)
Lymphocytes Relative: 23 % (ref 12–46)
Lymphs Abs: 2.6 10*3/uL (ref 0.7–4.0)
Monocytes Relative: 8 % (ref 3–12)
Neutro Abs: 7.6 10*3/uL (ref 1.7–7.7)
Neutrophils Relative %: 66 % (ref 43–77)
Potassium: 4.6 meq/L (ref 3.5–5.3)
RBC: 4.97 M/uL (ref 4.22–5.81)
Triglycerides: 168 mg/dL — ABNORMAL HIGH (ref <150)
WBC: 11.5 10*3/uL — ABNORMAL HIGH (ref 4.0–10.5)

## 2007-12-12 ENCOUNTER — Ambulatory Visit: Payer: Self-pay | Admitting: Infectious Diseases

## 2007-12-22 ENCOUNTER — Encounter (INDEPENDENT_AMBULATORY_CARE_PROVIDER_SITE_OTHER): Payer: Self-pay | Admitting: *Deleted

## 2008-02-16 ENCOUNTER — Ambulatory Visit: Payer: Self-pay | Admitting: Infectious Diseases

## 2008-02-16 DIAGNOSIS — R209 Unspecified disturbances of skin sensation: Secondary | ICD-10-CM | POA: Insufficient documentation

## 2008-02-21 ENCOUNTER — Telehealth: Payer: Self-pay | Admitting: Infectious Diseases

## 2008-02-24 ENCOUNTER — Encounter (INDEPENDENT_AMBULATORY_CARE_PROVIDER_SITE_OTHER): Payer: Self-pay | Admitting: *Deleted

## 2008-02-24 ENCOUNTER — Ambulatory Visit: Payer: Self-pay | Admitting: *Deleted

## 2008-02-24 DIAGNOSIS — R1933 Right lower quadrant abdominal rigidity: Secondary | ICD-10-CM | POA: Insufficient documentation

## 2008-02-24 LAB — CONVERTED CEMR LAB
Blood Glucose, Fingerstick: 197
Hgb A1c MFr Bld: 6.3 %

## 2008-03-23 ENCOUNTER — Encounter
Admission: RE | Admit: 2008-03-23 | Discharge: 2008-03-23 | Payer: Self-pay | Admitting: Physical Medicine & Rehabilitation

## 2008-05-15 ENCOUNTER — Ambulatory Visit: Payer: Self-pay | Admitting: Infectious Diseases

## 2008-05-15 ENCOUNTER — Encounter: Admission: RE | Admit: 2008-05-15 | Discharge: 2008-05-15 | Payer: Self-pay | Admitting: Infectious Diseases

## 2008-05-15 LAB — CONVERTED CEMR LAB
ALT: 81 units/L — ABNORMAL HIGH (ref 0–53)
AST: 45 units/L — ABNORMAL HIGH (ref 0–37)
Albumin: 4.2 g/dL (ref 3.5–5.2)
BUN: 15 mg/dL (ref 6–23)
Basophils Absolute: 0.1 10*3/uL (ref 0.0–0.1)
Calcium: 9.5 mg/dL (ref 8.4–10.5)
Chloride: 101 meq/L (ref 96–112)
Hemoglobin: 15.5 g/dL (ref 13.0–17.0)
Lymphocytes Relative: 23 % (ref 12–46)
Monocytes Absolute: 0.6 10*3/uL (ref 0.1–1.0)
Neutro Abs: 4.2 10*3/uL (ref 1.7–7.7)
Neutrophils Relative %: 62 % (ref 43–77)
Potassium: 4.5 meq/L (ref 3.5–5.3)
RDW: 13.2 % (ref 11.5–15.5)
Sodium: 137 meq/L (ref 135–145)
Total Protein: 6.9 g/dL (ref 6.0–8.3)

## 2008-06-05 ENCOUNTER — Encounter (INDEPENDENT_AMBULATORY_CARE_PROVIDER_SITE_OTHER): Payer: Self-pay | Admitting: *Deleted

## 2008-06-05 ENCOUNTER — Ambulatory Visit: Payer: Self-pay | Admitting: Infectious Diseases

## 2008-06-25 ENCOUNTER — Telehealth (INDEPENDENT_AMBULATORY_CARE_PROVIDER_SITE_OTHER): Payer: Self-pay | Admitting: *Deleted

## 2008-06-27 ENCOUNTER — Ambulatory Visit: Payer: Self-pay | Admitting: Internal Medicine

## 2008-07-02 ENCOUNTER — Telehealth (INDEPENDENT_AMBULATORY_CARE_PROVIDER_SITE_OTHER): Payer: Self-pay | Admitting: *Deleted

## 2008-08-13 ENCOUNTER — Ambulatory Visit: Payer: Self-pay | Admitting: *Deleted

## 2008-08-13 ENCOUNTER — Ambulatory Visit (HOSPITAL_COMMUNITY): Admission: RE | Admit: 2008-08-13 | Discharge: 2008-08-13 | Payer: Self-pay | Admitting: Infectious Diseases

## 2008-08-13 LAB — CONVERTED CEMR LAB
Bilirubin Urine: NEGATIVE
Blood in Urine, dipstick: NEGATIVE
Ketones, urine, test strip: NEGATIVE
Nitrite: NEGATIVE
Protein, U semiquant: NEGATIVE

## 2008-08-21 ENCOUNTER — Telehealth (INDEPENDENT_AMBULATORY_CARE_PROVIDER_SITE_OTHER): Payer: Self-pay | Admitting: Internal Medicine

## 2008-09-24 ENCOUNTER — Emergency Department (HOSPITAL_COMMUNITY): Admission: EM | Admit: 2008-09-24 | Discharge: 2008-09-24 | Payer: Self-pay | Admitting: Family Medicine

## 2008-09-29 ENCOUNTER — Encounter: Payer: Self-pay | Admitting: Internal Medicine

## 2008-10-01 ENCOUNTER — Ambulatory Visit: Payer: Self-pay | Admitting: Internal Medicine

## 2008-10-01 LAB — CONVERTED CEMR LAB: Blood Glucose, Fingerstick: 220

## 2008-10-08 ENCOUNTER — Ambulatory Visit: Payer: Self-pay | Admitting: Internal Medicine

## 2008-10-08 ENCOUNTER — Encounter: Payer: Self-pay | Admitting: Internal Medicine

## 2008-10-08 LAB — CONVERTED CEMR LAB
Blood Glucose, Fingerstick: 188
Microalb, Ur: 1.09 mg/dL (ref 0.00–1.89)

## 2008-10-09 LAB — CONVERTED CEMR LAB
CO2: 21 meq/L (ref 19–32)
Calcium: 9.3 mg/dL (ref 8.4–10.5)
Chloride: 103 meq/L (ref 96–112)
HDL: 40 mg/dL (ref >39)
LDL Cholesterol: 124 mg/dL — ABNORMAL HIGH (ref 0–99)
Sodium: 138 meq/L (ref 135–145)
Total CHOL/HDL Ratio: 6
Triglycerides: 385 mg/dL — ABNORMAL HIGH (ref <150)

## 2008-10-22 ENCOUNTER — Encounter: Payer: Self-pay | Admitting: Internal Medicine

## 2008-10-22 ENCOUNTER — Ambulatory Visit: Payer: Self-pay | Admitting: Internal Medicine

## 2008-10-27 LAB — CONVERTED CEMR LAB
Alkaline Phosphatase: 98 units/L (ref 39–117)
CO2: 22 meq/L (ref 19–32)
Creatinine, Ser: 0.82 mg/dL (ref 0.40–1.50)
Glucose, Bld: 170 mg/dL — ABNORMAL HIGH (ref 70–99)
HCT: 46.6 % (ref 39.0–52.0)
MCHC: 34.3 g/dL (ref 30.0–36.0)
MCV: 93 fL (ref 78.0–100.0)
RBC: 5.01 M/uL (ref 4.22–5.81)
Sodium: 138 meq/L (ref 135–145)
TSH: 1.499 microintl units/mL (ref 0.350–4.50)
Total Bilirubin: 0.5 mg/dL (ref 0.3–1.2)
Total Protein: 7 g/dL (ref 6.0–8.3)
Vitamin B-12: 656 pg/mL (ref 211–911)
WBC: 8.5 10*3/uL (ref 4.0–10.5)

## 2008-10-30 ENCOUNTER — Encounter: Payer: Self-pay | Admitting: Internal Medicine

## 2008-11-26 ENCOUNTER — Ambulatory Visit: Payer: Self-pay | Admitting: *Deleted

## 2008-11-26 ENCOUNTER — Encounter: Payer: Self-pay | Admitting: Infectious Diseases

## 2008-11-26 LAB — CONVERTED CEMR LAB
ALT: 77 units/L — ABNORMAL HIGH (ref 0–53)
Albumin: 4.5 g/dL (ref 3.5–5.2)
Basophils Relative: 1 % (ref 0–1)
CO2: 24 meq/L (ref 19–32)
Chloride: 103 meq/L (ref 96–112)
Glucose, Bld: 150 mg/dL — ABNORMAL HIGH (ref 70–99)
HCT: 46 % (ref 39.0–52.0)
HIV 1 RNA Quant: 60 copies/mL — ABNORMAL HIGH (ref <48)
Hemoglobin: 16.2 g/dL (ref 13.0–17.0)
Lymphocytes Relative: 23 % (ref 12–46)
Lymphs Abs: 2 10*3/uL (ref 0.7–4.0)
Monocytes Absolute: 0.6 10*3/uL (ref 0.1–1.0)
Monocytes Relative: 8 % (ref 3–12)
Neutro Abs: 5.6 10*3/uL (ref 1.7–7.7)
Neutrophils Relative %: 66 % (ref 43–77)
Potassium: 5.1 meq/L (ref 3.5–5.3)
RBC: 4.8 M/uL (ref 4.22–5.81)
Sodium: 138 meq/L (ref 135–145)
Total Bilirubin: 0.5 mg/dL (ref 0.3–1.2)
Total Protein: 7.1 g/dL (ref 6.0–8.3)
WBC: 8.4 10*3/uL (ref 4.0–10.5)

## 2008-12-03 ENCOUNTER — Encounter: Payer: Self-pay | Admitting: Internal Medicine

## 2008-12-17 ENCOUNTER — Ambulatory Visit: Payer: Self-pay | Admitting: Internal Medicine

## 2008-12-18 ENCOUNTER — Ambulatory Visit: Payer: Self-pay | Admitting: Infectious Diseases

## 2008-12-18 DIAGNOSIS — I209 Angina pectoris, unspecified: Secondary | ICD-10-CM | POA: Diagnosis present

## 2008-12-18 HISTORY — DX: Angina pectoris, unspecified: I20.9

## 2008-12-19 ENCOUNTER — Telehealth: Payer: Self-pay | Admitting: Internal Medicine

## 2008-12-19 ENCOUNTER — Encounter: Payer: Self-pay | Admitting: Internal Medicine

## 2008-12-19 ENCOUNTER — Encounter (INDEPENDENT_AMBULATORY_CARE_PROVIDER_SITE_OTHER): Payer: Self-pay | Admitting: *Deleted

## 2009-01-10 ENCOUNTER — Ambulatory Visit: Payer: Self-pay | Admitting: Internal Medicine

## 2009-01-10 DIAGNOSIS — R0609 Other forms of dyspnea: Secondary | ICD-10-CM | POA: Insufficient documentation

## 2009-01-10 DIAGNOSIS — R0989 Other specified symptoms and signs involving the circulatory and respiratory systems: Secondary | ICD-10-CM

## 2009-01-11 ENCOUNTER — Ambulatory Visit (HOSPITAL_COMMUNITY): Admission: RE | Admit: 2009-01-11 | Discharge: 2009-01-11 | Payer: Self-pay | Admitting: Internal Medicine

## 2009-01-14 ENCOUNTER — Encounter: Payer: Self-pay | Admitting: Internal Medicine

## 2009-01-23 ENCOUNTER — Telehealth: Payer: Self-pay | Admitting: Internal Medicine

## 2009-02-04 ENCOUNTER — Encounter: Payer: Self-pay | Admitting: Internal Medicine

## 2009-02-07 ENCOUNTER — Ambulatory Visit: Payer: Self-pay | Admitting: *Deleted

## 2009-02-07 LAB — CONVERTED CEMR LAB: Blood Glucose, Fingerstick: 227

## 2009-02-11 ENCOUNTER — Encounter (INDEPENDENT_AMBULATORY_CARE_PROVIDER_SITE_OTHER): Payer: Self-pay | Admitting: Internal Medicine

## 2009-02-11 ENCOUNTER — Ambulatory Visit: Payer: Self-pay | Admitting: Internal Medicine

## 2009-02-11 LAB — CONVERTED CEMR LAB
ALT: 96 units/L — ABNORMAL HIGH (ref 0–53)
AST: 64 units/L — ABNORMAL HIGH (ref 0–37)
Albumin: 4 g/dL (ref 3.5–5.2)
BUN: 13 mg/dL (ref 6–23)
CO2: 26 meq/L (ref 19–32)
Calcium: 9.6 mg/dL (ref 8.4–10.5)
Chloride: 102 meq/L (ref 96–112)
GFR calc Af Amer: >60 mL/min (ref >60)
Potassium: 4.4 meq/L (ref 3.5–5.3)

## 2009-02-12 ENCOUNTER — Encounter: Payer: Self-pay | Admitting: Internal Medicine

## 2009-02-19 ENCOUNTER — Encounter: Payer: Self-pay | Admitting: Internal Medicine

## 2009-02-19 ENCOUNTER — Ambulatory Visit (HOSPITAL_COMMUNITY): Admission: RE | Admit: 2009-02-19 | Discharge: 2009-02-19 | Payer: Self-pay | Admitting: Internal Medicine

## 2009-03-18 ENCOUNTER — Encounter: Payer: Self-pay | Admitting: Internal Medicine

## 2009-04-02 ENCOUNTER — Encounter: Payer: Self-pay | Admitting: Internal Medicine

## 2009-04-09 ENCOUNTER — Telehealth (INDEPENDENT_AMBULATORY_CARE_PROVIDER_SITE_OTHER): Payer: Self-pay | Admitting: *Deleted

## 2009-04-23 ENCOUNTER — Telehealth (INDEPENDENT_AMBULATORY_CARE_PROVIDER_SITE_OTHER): Payer: Self-pay | Admitting: *Deleted

## 2009-04-23 ENCOUNTER — Ambulatory Visit: Payer: Self-pay | Admitting: Internal Medicine

## 2009-04-23 ENCOUNTER — Encounter: Payer: Self-pay | Admitting: Internal Medicine

## 2009-04-23 LAB — CONVERTED CEMR LAB
Absolute CD4: 862 #/uL (ref 381–1469)
HIV 1 RNA Quant: 1020 copies/mL — ABNORMAL HIGH (ref <48)

## 2009-04-29 ENCOUNTER — Ambulatory Visit: Payer: Self-pay | Admitting: Internal Medicine

## 2009-04-29 ENCOUNTER — Encounter: Payer: Self-pay | Admitting: Internal Medicine

## 2009-04-29 LAB — FECAL OCCULT BLOOD, GUAIAC: Fecal Occult Blood: NEGATIVE

## 2009-04-29 LAB — CONVERTED CEMR LAB
OCCULT 1: NEGATIVE
OCCULT 2: NEGATIVE
OCCULT 3: NEGATIVE

## 2009-05-07 ENCOUNTER — Ambulatory Visit: Payer: Self-pay | Admitting: Infectious Diseases

## 2009-05-07 DIAGNOSIS — R350 Frequency of micturition: Secondary | ICD-10-CM | POA: Insufficient documentation

## 2009-05-07 LAB — CONVERTED CEMR LAB
Bilirubin Urine: NEGATIVE
HIV-1 RNA Quant, Log: 2.72 — ABNORMAL HIGH (ref <1.68)
Ketones, ur: NEGATIVE mg/dL
Specific Gravity, Urine: 1.014 (ref 1.005–1.0)
Urine Glucose: NEGATIVE mg/dL
pH: 6 (ref 5.0–8.0)

## 2009-06-26 ENCOUNTER — Encounter: Payer: Self-pay | Admitting: Internal Medicine

## 2009-06-26 LAB — HM DIABETES EYE EXAM

## 2009-07-23 ENCOUNTER — Telehealth: Payer: Self-pay | Admitting: *Deleted

## 2009-07-24 ENCOUNTER — Encounter: Payer: Self-pay | Admitting: Infectious Diseases

## 2009-07-24 ENCOUNTER — Encounter (INDEPENDENT_AMBULATORY_CARE_PROVIDER_SITE_OTHER): Payer: Self-pay | Admitting: *Deleted

## 2009-07-24 ENCOUNTER — Encounter: Payer: Self-pay | Admitting: Internal Medicine

## 2009-07-24 ENCOUNTER — Ambulatory Visit: Payer: Self-pay | Admitting: Internal Medicine

## 2009-07-24 DIAGNOSIS — Q5569 Other congenital malformation of penis: Secondary | ICD-10-CM | POA: Insufficient documentation

## 2009-07-24 LAB — CONVERTED CEMR LAB
ALT: 83 units/L — ABNORMAL HIGH (ref 0–53)
AST: 64 units/L — ABNORMAL HIGH (ref 0–37)
Albumin: 4.4 g/dL (ref 3.5–5.2)
Alkaline Phosphatase: 98 units/L (ref 39–117)
BUN: 13 mg/dL (ref 6–23)
Basophils Absolute: 0.1 10*3/uL (ref 0.0–0.1)
Basophils Relative: 1 % (ref 0–1)
Blood Glucose, Fingerstick: 227
Hemoglobin: 15.7 g/dL (ref 13.0–17.0)
Hgb A1c MFr Bld: 8.3 %
MCHC: 34.1 g/dL (ref 30.0–36.0)
Monocytes Absolute: 0.5 10*3/uL (ref 0.1–1.0)
Neutro Abs: 4.6 10*3/uL (ref 1.7–7.7)
Potassium: 4.4 meq/L (ref 3.5–5.3)
RDW: 12.9 % (ref 11.5–15.5)
Sodium: 137 meq/L (ref 135–145)
Total Protein: 7.1 g/dL (ref 6.0–8.3)

## 2009-08-13 ENCOUNTER — Ambulatory Visit: Payer: Self-pay | Admitting: Infectious Diseases

## 2009-08-30 ENCOUNTER — Telehealth: Payer: Self-pay

## 2009-09-13 ENCOUNTER — Encounter: Payer: Self-pay | Admitting: Internal Medicine

## 2009-09-13 ENCOUNTER — Encounter: Payer: Self-pay | Admitting: Infectious Diseases

## 2009-10-08 ENCOUNTER — Encounter: Payer: Self-pay | Admitting: Internal Medicine

## 2009-10-25 ENCOUNTER — Telehealth: Payer: Self-pay | Admitting: Internal Medicine

## 2009-11-04 ENCOUNTER — Ambulatory Visit: Payer: Self-pay | Admitting: Internal Medicine

## 2009-11-04 ENCOUNTER — Telehealth (INDEPENDENT_AMBULATORY_CARE_PROVIDER_SITE_OTHER): Payer: Self-pay | Admitting: *Deleted

## 2009-11-04 DIAGNOSIS — R519 Headache, unspecified: Secondary | ICD-10-CM | POA: Insufficient documentation

## 2009-11-04 DIAGNOSIS — R51 Headache: Secondary | ICD-10-CM | POA: Insufficient documentation

## 2009-11-04 LAB — CONVERTED CEMR LAB: Blood Glucose, Fingerstick: 243

## 2009-11-05 ENCOUNTER — Ambulatory Visit: Payer: Self-pay | Admitting: Internal Medicine

## 2009-11-05 LAB — CONVERTED CEMR LAB
Albumin: 4.1 g/dL (ref 3.5–5.2)
BUN: 16 mg/dL (ref 6–23)
Calcium: 9.9 mg/dL (ref 8.4–10.5)
Chloride: 101 meq/L (ref 96–112)
Eosinophils Relative: 3 % (ref 0–5)
Glucose, Bld: 250 mg/dL — ABNORMAL HIGH (ref 70–99)
HCT: 47.2 % (ref 39.0–52.0)
HDL: 37 mg/dL — ABNORMAL LOW (ref >39)
Hemoglobin: 15.8 g/dL (ref 13.0–17.0)
Lymphocytes Relative: 27 % (ref 12–46)
Lymphs Abs: 2.2 10*3/uL (ref 0.7–4.0)
Monocytes Absolute: 0.7 10*3/uL (ref 0.1–1.0)
Monocytes Relative: 9 % (ref 3–12)
Potassium: 4.5 meq/L (ref 3.5–5.3)
Triglycerides: 316 mg/dL — ABNORMAL HIGH (ref <150)
WBC: 8 10*3/uL (ref 4.0–10.5)

## 2009-12-04 ENCOUNTER — Ambulatory Visit (HOSPITAL_COMMUNITY): Admission: RE | Admit: 2009-12-04 | Discharge: 2009-12-04 | Payer: Self-pay | Admitting: Internal Medicine

## 2009-12-04 ENCOUNTER — Encounter: Payer: Self-pay | Admitting: Internal Medicine

## 2009-12-04 ENCOUNTER — Ambulatory Visit: Payer: Self-pay | Admitting: Internal Medicine

## 2009-12-04 DIAGNOSIS — M79609 Pain in unspecified limb: Secondary | ICD-10-CM | POA: Insufficient documentation

## 2009-12-06 ENCOUNTER — Telehealth: Payer: Self-pay | Admitting: Internal Medicine

## 2009-12-18 ENCOUNTER — Ambulatory Visit: Payer: Self-pay | Admitting: Internal Medicine

## 2010-01-24 ENCOUNTER — Encounter (INDEPENDENT_AMBULATORY_CARE_PROVIDER_SITE_OTHER): Payer: Self-pay | Admitting: *Deleted

## 2010-02-10 ENCOUNTER — Encounter: Payer: Self-pay | Admitting: Internal Medicine

## 2010-02-10 ENCOUNTER — Ambulatory Visit: Payer: Self-pay | Admitting: Infectious Diseases

## 2010-02-10 LAB — CONVERTED CEMR LAB
AST: 34 units/L (ref 0–37)
Albumin: 4.4 g/dL (ref 3.5–5.2)
BUN: 15 mg/dL (ref 6–23)
Calcium: 9.8 mg/dL (ref 8.4–10.5)
Chloride: 104 meq/L (ref 96–112)
Creatinine, Ser: 0.91 mg/dL (ref 0.40–1.50)
Eosinophils Absolute: 0.2 10*3/uL (ref 0.0–0.7)
Eosinophils Relative: 3 % (ref 0–5)
Glucose, Bld: 165 mg/dL — ABNORMAL HIGH (ref 70–99)
HCT: 45.6 % (ref 39.0–52.0)
HIV 1 RNA Quant: 48 copies/mL — ABNORMAL HIGH (ref <48)
Hemoglobin: 15.5 g/dL (ref 13.0–17.0)
Lymphs Abs: 2.2 10*3/uL (ref 0.7–4.0)
MCV: 92.9 fL (ref 78.0–100.0)
Monocytes Absolute: 0.6 10*3/uL (ref 0.1–1.0)
Platelets: 197 10*3/uL (ref 150–400)
RDW: 13.5 % (ref 11.5–15.5)
WBC: 7.5 10*3/uL (ref 4.0–10.5)

## 2010-02-20 ENCOUNTER — Telehealth (INDEPENDENT_AMBULATORY_CARE_PROVIDER_SITE_OTHER): Payer: Self-pay | Admitting: *Deleted

## 2010-02-21 ENCOUNTER — Telehealth: Payer: Self-pay | Admitting: Internal Medicine

## 2010-02-25 ENCOUNTER — Ambulatory Visit: Payer: Self-pay | Admitting: Infectious Diseases

## 2010-02-25 ENCOUNTER — Telehealth: Payer: Self-pay | Admitting: Internal Medicine

## 2010-02-25 ENCOUNTER — Encounter: Payer: Self-pay | Admitting: Internal Medicine

## 2010-04-09 ENCOUNTER — Ambulatory Visit: Payer: Self-pay | Admitting: Internal Medicine

## 2010-04-09 DIAGNOSIS — R21 Rash and other nonspecific skin eruption: Secondary | ICD-10-CM | POA: Insufficient documentation

## 2010-04-21 ENCOUNTER — Telehealth (INDEPENDENT_AMBULATORY_CARE_PROVIDER_SITE_OTHER): Payer: Self-pay | Admitting: *Deleted

## 2010-06-25 ENCOUNTER — Telehealth: Payer: Self-pay | Admitting: Internal Medicine

## 2010-06-25 ENCOUNTER — Encounter: Payer: Self-pay | Admitting: Internal Medicine

## 2010-06-26 ENCOUNTER — Ambulatory Visit: Payer: Self-pay | Admitting: Internal Medicine

## 2010-06-26 LAB — CONVERTED CEMR LAB
Blood Glucose, Fingerstick: 142
Hgb A1c MFr Bld: 7.5 %
Hgb A1c MFr Bld: 7.5 %

## 2010-06-26 LAB — HM DIABETES FOOT EXAM

## 2010-07-14 ENCOUNTER — Encounter: Payer: Self-pay | Admitting: *Deleted

## 2010-08-18 ENCOUNTER — Encounter (INDEPENDENT_AMBULATORY_CARE_PROVIDER_SITE_OTHER): Payer: Self-pay | Admitting: *Deleted

## 2010-08-25 ENCOUNTER — Ambulatory Visit: Payer: Self-pay | Admitting: Internal Medicine

## 2010-08-25 ENCOUNTER — Encounter: Payer: Self-pay | Admitting: Internal Medicine

## 2010-08-25 LAB — CONVERTED CEMR LAB
ALT: 40 units/L (ref 0–53)
AST: 26 units/L (ref 0–37)
Albumin: 4.5 g/dL (ref 3.5–5.2)
Basophils Absolute: 0 10*3/uL (ref 0.0–0.1)
Basophils Relative: 1 % (ref 0–1)
CO2: 25 meq/L (ref 19–32)
Calcium: 9.9 mg/dL (ref 8.4–10.5)
Chloride: 105 meq/L (ref 96–112)
Hemoglobin: 15.3 g/dL (ref 13.0–17.0)
Lymphocytes Relative: 27 % (ref 12–46)
MCHC: 34.3 g/dL (ref 30.0–36.0)
Monocytes Absolute: 0.6 10*3/uL (ref 0.1–1.0)
Neutro Abs: 5.2 10*3/uL (ref 1.7–7.7)
Neutrophils Relative %: 63 % (ref 43–77)
Potassium: 4.6 meq/L (ref 3.5–5.3)
RBC: 4.76 M/uL (ref 4.22–5.81)
RDW: 13.3 % (ref 11.5–15.5)
Total Protein: 6.9 g/dL (ref 6.0–8.3)

## 2010-09-08 ENCOUNTER — Ambulatory Visit: Payer: Self-pay | Admitting: Internal Medicine

## 2010-09-08 ENCOUNTER — Encounter (INDEPENDENT_AMBULATORY_CARE_PROVIDER_SITE_OTHER): Payer: Self-pay | Admitting: *Deleted

## 2010-09-15 ENCOUNTER — Encounter: Payer: Self-pay | Admitting: Internal Medicine

## 2010-09-25 ENCOUNTER — Encounter: Payer: Self-pay | Admitting: Internal Medicine

## 2010-09-29 ENCOUNTER — Ambulatory Visit: Admission: RE | Admit: 2010-09-29 | Discharge: 2010-09-29 | Payer: Self-pay | Source: Home / Self Care

## 2010-09-29 ENCOUNTER — Encounter: Payer: Self-pay | Admitting: Internal Medicine

## 2010-10-21 ENCOUNTER — Telehealth: Payer: Self-pay | Admitting: Internal Medicine

## 2010-10-26 LAB — CONVERTED CEMR LAB
ALT: 27 units/L (ref 0–53)
AST: 24 units/L (ref 0–37)
Albumin: 4.2 g/dL (ref 3.5–5.2)
Alkaline Phosphatase: 93 units/L (ref 39–117)
BUN: 15 mg/dL (ref 6–23)
Basophils Absolute: 0 10*3/uL (ref 0.0–0.1)
Basophils Relative: 1 % (ref 0–1)
CD4 Count: 670 microliters
CO2: 24 meq/L (ref 19–32)
Calcium: 9.4 mg/dL (ref 8.4–10.5)
Chloride: 101 meq/L (ref 96–112)
Creatinine, Ser: 0.76 mg/dL (ref 0.40–1.50)
Eosinophils Absolute: 0.2 10*3/uL (ref 0.0–0.7)
Eosinophils Relative: 2 % (ref 0–5)
Glucose, Bld: 117 mg/dL — ABNORMAL HIGH (ref 70–99)
HCT: 45.1 % (ref 39.0–52.0)
HIV 1 RNA Quant: 263 copies/mL — ABNORMAL HIGH (ref <50)
HIV-1 RNA Quant, Log: 2.42 — ABNORMAL HIGH (ref <1.70)
Hemoglobin: 15.7 g/dL (ref 13.0–17.0)
Lymphocytes Relative: 24 % (ref 12–46)
Lymphs Abs: 1.6 10*3/uL (ref 0.7–3.3)
MCHC: 34.8 g/dL (ref 30.0–36.0)
MCV: 92.8 fL (ref 78.0–100.0)
Monocytes Absolute: 0.5 10*3/uL (ref 0.2–0.7)
Monocytes Relative: 8 % (ref 3–11)
Neutro Abs: 4.3 10*3/uL (ref 1.7–7.7)
Neutrophils Relative %: 65 % (ref 43–77)
Platelets: 229 10*3/uL (ref 150–400)
Potassium: 3.9 meq/L (ref 3.5–5.3)
RBC: 4.86 M/uL (ref 4.22–5.81)
RDW: 13.9 % (ref 11.5–14.0)
Sodium: 138 meq/L (ref 135–145)
Total Bilirubin: 0.7 mg/dL (ref 0.3–1.2)
Total Protein: 6.7 g/dL (ref 6.0–8.3)
WBC: 6.6 10*3/uL (ref 4.0–10.5)

## 2010-10-28 NOTE — Progress Notes (Signed)
Summary: Refill/gh  Phone Note Refill Request Message from:  Fax from Pharmacy on Feb 25, 2010 4:01 PM  Refills Requested: Medication #1:  GLUCOTROL 10 MG TABS Take 2 tablets by mouth in the morning and 1 tablet by mouth in the evening.   Last Refilled: 02/21/2010 Prescription was written for #60 pt takes 3 per day needs a 30 day supply of 90.   Method Requested: Electronic Initial call taken by: Angelina Ok RN,  Feb 25, 2010 4:02 PM  Follow-up for Phone Call       Follow-up by: Blondell Reveal MD,  February 26, 2010 12:15 PM    Prescriptions: GLUCOTROL 10 MG TABS (GLIPIZIDE) Take 2 tablets by mouth in the morning and 1 tablet by mouth in the evening.  #90 x 6   Entered and Authorized by:   Blondell Reveal MD   Signed by:   Blondell Reveal MD on 02/26/2010   Method used:   Electronically to        [facility] (retail)       [address]       [place], [place]  [id number]       Ph: [phone]       Fax: [phone]   RxID:   [id number]

## 2010-10-28 NOTE — Miscellaneous (Signed)
Summary: ALLSUP/MEDICAL RECORDS RELEASE  ALLSUP/MEDICAL RECORDS RELEASE   Imported By: Margie Billet 03/11/2010 14:07:29  _____________________________________________________________________  External Attachment:    Type:   Image     Comment:   External Document

## 2010-10-28 NOTE — Progress Notes (Signed)
Summary: med refill/gp  Phone Note Refill Request Message from:  Fax from Pharmacy on June 25, 2010 9:59 AM  Refills Requested: Medication #1:  TERAZOSIN HCL 2 MG CAPS Take 2 capsule by mouth once a day at bedtime   Last Refilled: 04/22/2010 Next appt. 06/26/10.   Method Requested: Electronic Initial call taken by: Chinita Pester RN,  June 25, 2010 9:59 AM  Follow-up for Phone Call       Follow-up by: Blondell Reveal MD,  June 26, 2010 9:53 AM    Prescriptions: TERAZOSIN HCL 2 MG CAPS (TERAZOSIN HCL) Take 2 capsule by mouth once a day at bedtime  #60 x 2   Entered and Authorized by:   Blondell Reveal MD   Signed by:   Blondell Reveal MD on 06/26/2010   Method used:   Electronically to        [facility]  [phone]* (retail)       [address]       [place], [place]  [address]       Ph: [phone] or [phone]       Fax: [phone]   RxID:   [id number]

## 2010-10-28 NOTE — Progress Notes (Signed)
  Phone Note Call from Patient   Caller: Patient Reason for Call: Acute Illness Summary of Call: pt. called c/o worsening rash since prescribed Acyclovir and Lamisil.  Spoke with Dr. Philipp Deputy and suggested pt. be referred to dermatology.  Initial call taken by: Wendall Mola CMA Duncan Dull),  April 21, 2010 2:08 PM

## 2010-10-28 NOTE — Assessment & Plan Note (Signed)
Summary: 2WK F/U/BOGGALA/VS   Vital Signs:  Patient profile:   55 year old male Height:      69 inches (175.26 cm) Weight:      211.7 pounds (96.23 kg) BMI:     31.38 Pulse rate:   66 / minute BP sitting:   127 / 77  (right arm) Cuff size:   large  Vitals Entered By: Krystal Eaton Duncan Dull) (December 18, 2009 8:36 AM) CC: f/u diabetes after starting insulin, trouble sleeping Is Patient Diabetic? Yes Did you bring your meter with you today? Yes Nutritional Status BMI of > 30 = obese  Have you ever been in a relationship where you felt threatened, hurt or afraid?No   Does patient need assistance? Functional Status Self care Ambulation Normal   Primary Care Provider:  Blondell Reveal MD  CC:  f/u diabetes after starting insulin and trouble sleeping.  History of Present Illness: [name] is a 55 year old man who has well controlled HIV, DM II and other med probelms as described in emr.  He comes in today for a follow up appointment for his CBG checks and has brought his meter with him. His BG readings have a trend of being high in the mronings. he also says that he has had couple of episodes of his BG being in 50's when he was working outside too hard and had to take candy.  His foot pain is better.  Still smoking 1/2 a pack and not ready to quit.  No other complaints.  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) 4)  Urinary Frequency  (ICD-788.41) 5)  Preventive Health Care  (ICD-V70.0) 6)  Dyspnea On Exertion  (ICD-786.09) 7)  Angina, Atypical  (ICD-413.9) 8)  Abdominal Rigidity Right Lower Quadrant  (ICD-789.43) 9)  Disability Examination  (ICD-V68.01) 10)  Paresthesia, Hands  (ICD-782.0) 11)  Acute Bronchitis  (ICD-466.0) 12)  Preventive Health Care  (ICD-V70.0) 13)  Arthropathy  Nos, Shoulder  (ICD-716.91) 14)  Urticaria Nos  (ICD-708.9) 15)  Testosterone Deficiency  (ICD-257.2) 16)  Insect Bite  (ICD-919.4) 17)  Tobacco User  (ICD-305.1) 18)  Back Pain, Thoracic Region  (ICD-724.1) 19)  Rheumatoid Arthritis  (ICD-714.0) 20)  Hyperlipidemia  (ICD-272.4) 21)  HIV Disease  (ICD-042) 22)  HIV Infection  (ICD-042) 23)  Carpal Tunnel Syndrome  (ICD-354.0) 24)  Hypertension  (ICD-401.9) 25)  Diabetes Mellitus, Type II  (ICD-250.00) 26)  Depression  (ICD-311)  Medications Prior to Update: 1)  Atripla 600-200-300 Mg Tabs (Efavirenz-Emtricitab-Tenofovir) .... Take 1 Tablet By Mouth Once A Day 2)  Omeprazole 20 Mg Cpdr (Omeprazole) .... Take 1 Tablet By Mouth Once A Day 3)  Metformin Hcl 1000 Mg Tabs (Metformin Hcl) .Marland Kitchen.. 1 Tablet Twice Daily With Food 4)  Ascensia Contour Test   Strp (Glucose Blood) .... Take As Directed 5)  Adult Aspirin Low Strength 81 Mg  Tbdp (Aspirin) 6)  Zoloft 50 Mg  Tabs (Sertraline Hcl) .... Take 1 Tablet By Mouth Once A Day 7)  Ibu 800 Mg  Tabs (Ibuprofen) .... Take 1 Tablet By Mouth Three Times A Day 8)  Nasonex 50 Mcg/act Susp (Mometasone Furoate) .... 2 Sprays Each Nostril Once A Day 9)  Lisinopril 20 Mg Tabs (Lisinopril) .... Take 1 Pill Once Daily. 10)  Pravachol 40 Mg Tabs (Pravastatin Sodium) .Marland KitchenMarland KitchenMarland Kitchen  Take 1 Tablet By Mouth Once A Day 11)  Ventolin Hfa 108 (90 Base) Mcg/act Aers (Albuterol Sulfate) .... Use 1-2 Puffs Every 4-6 Hours As Needed For Shortness of Breath 12)  Amlodipine Besylate 10 Mg Tabs (Amlodipine Besylate) .... Take 1 Tablet By Mouth Once A Day 13)  Terazosin Hcl 2 Mg Caps (Terazosin Hcl) .... Take 2 Capsule By Mouth Once A Day At Bedtime 14)  Glucotrol 10 Mg Tabs (Glipizide) .... Take 2 Tablets By Mouth in The Morning and 1 Tablet By Mouth in The Evening. 15)  Voltaren 1 % Gel (Diclofenac Sodium) .... Apply To Affected Area 3-4 Times A Day. 16)  Omeprazole 20 Mg Cpdr (Omeprazole) .... Take 1 Tablet By Mouth Once A Day 17)   Humulin N 100 Unit/ml Susp (Insulin Isophane Human) .... Please Dispense Relion Humulin N Insulin - Inject 10 Units At Night  Current Medications (verified): 1)  Atripla 600-200-300 Mg Tabs (Efavirenz-Emtricitab-Tenofovir) .... Take 1 Tablet By Mouth Once A Day 2)  Omeprazole 20 Mg Cpdr (Omeprazole) .... Take 1 Tablet By Mouth Once A Day 3)  Metformin Hcl 1000 Mg Tabs (Metformin Hcl) .Marland Kitchen.. 1 Tablet Twice Daily With Food 4)  Ascensia Contour Test   Strp (Glucose Blood) .... Take As Directed 5)  Adult Aspirin Low Strength 81 Mg  Tbdp (Aspirin) 6)  Zoloft 50 Mg  Tabs (Sertraline Hcl) .... Take 1 Tablet By Mouth Once A Day 7)  Nasonex 50 Mcg/act Susp (Mometasone Furoate) .... 2 Sprays Each Nostril Once A Day 8)  Lisinopril 20 Mg Tabs (Lisinopril) .... Take 1 Pill Once Daily. 9)  Pravachol 40 Mg Tabs (Pravastatin Sodium) .... Take 1 Tablet By Mouth Once A Day 10)  Ventolin Hfa 108 (90 Base) Mcg/act Aers (Albuterol Sulfate) .... Use 1-2 Puffs Every 4-6 Hours As Needed For Shortness of Breath 11)  Amlodipine Besylate 10 Mg Tabs (Amlodipine Besylate) .... Take 1 Tablet By Mouth Once A Day 12)  Terazosin Hcl 2 Mg Caps (Terazosin Hcl) .... Take 2 Capsule By Mouth Once A Day At Bedtime 13)  Glucotrol 10 Mg Tabs (Glipizide) .... Take 2 Tablets By Mouth in The Morning and 1 Tablet By Mouth in The Evening. 14)  Humulin N 100 Unit/ml Susp (Insulin Isophane Human) .... Please Dispense Relion Humulin N Insulin - Inject 12 Units At Night  Allergies (verified): No Known Drug Allergies  Past History:  Past Medical History: Last updated: 12/12/2007 Depression stable off meds Diabetes mellitus, type II - well controlled Hypertension - well controlled off meds HIV disease - dxed 1995 after shingles followed by ID Dr. Sampson Goon Hyperlipidemia Rheumatoid arthritis:  followed by Dr. Kellie Simmering, off mtx and prednisione since 2007  Family History: Last updated: 07/19/2007 [place]  Social History: Last updated:  10/22/2008 Occupation: retired [employer] architect Current Smoker cut down to >1/2 pack per day. Alcohol use-yes occassional Drug use-no Regular exercise-yes walks (2-3 times per week) Not sexually active  -  On disability - approved  Risk Factors: Alcohol Use: 0 (12/04/2009) Caffeine Use: coffee,tea (11/04/2009) Exercise: yes (11/04/2009)  Risk Factors: Smoking Status: current (12/04/2009) Packs/Day: 0.5 (12/04/2009) Passive Smoke Exposure: no (12/04/2009)  Review of Systems      See HPI  Physical Exam  Additional Exam:  Gen: AOx3, in no acute distress Eyes: PERRL, EOMI ENT:MMM, No erythema noted in posterior pharynx Neck: No JVD, No LAP Chest: CTAB with  good respiratory effort CVS: regular rhythmic rate, NO M/R/G, S1 S2 normal Abdo: soft,ND, BS+x4, Non tender and  No hepatosplenomegaly EXT: No odema noted Neuro: Non focal, gait is normal Skin: no rashes noted.    Impression & Recommendations:  Problem # 1:  DIABETES MELLITUS, TYPE II (ICD-250.00) Assessment Comment Only We will increase his insulin from 10 units to 12 units at night based on his morning BG readings on meter. Eye exam is due but he is waiting for his medicare/medcaid to kick in. His updated medication list for this problem includes:    Metformin Hcl 1000 Mg Tabs (Metformin hcl) .Marland Kitchen... 1 tablet twice daily with food    Adult Aspirin Low Strength 81 Mg Tbdp (Aspirin)    Lisinopril 20 Mg Tabs (Lisinopril) .Marland Kitchen... Take 1 pill once daily.    Glucotrol 10 Mg Tabs (Glipizide) .Marland Kitchen... Take 2 tablets by mouth in the morning and 1 tablet by mouth in the evening.    Humulin N 100 Unit/ml Susp (Insulin isophane human) .Marland Kitchen... Please dispense relion humulin n insulin - inject 12 units at night  Problem # 2:  FOOT PAIN, RIGHT (ICD-729.5) Assessment: Improved He never refilled the priscription of volterin gel but feels much better today.  Problem # 3:  URINARY FREQUENCY (ICD-788.41) Assessment: Improved Improved  significantly with Terazocin. His updated medication list for this problem includes:    Terazosin Hcl 2 Mg Caps (Terazosin hcl) .Marland Kitchen... Take 2 capsule by mouth once a day at bedtime  Problem # 4:  TOBACCO USER (ICD-305.1) Assessment: Comment Only Not interested in smoking cessation at this time. Reviewed the benefits of smoking cessation with him.  Problem # 5:  HIV DISEASE (ICD-042) Assessment: Comment Only Well maintained with CD4 740 and VL>100 on atripla. Next appointment with Dr Sampson Goon in May.  Problem # 6:  Preventive Health Care (ICD-V70.0) Assessment: Comment Only Due for colonoscopy but will wait till medicare or medicaid kick in.  Complete Medication List: 1)  Atripla 600-200-300 Mg Tabs (Efavirenz-emtricitab-tenofovir) .... Take 1 tablet by mouth once a day 2)  Omeprazole 20 Mg Cpdr (Omeprazole) .... Take 1 tablet by mouth once a day 3)  Metformin Hcl 1000 Mg Tabs (Metformin hcl) .Marland Kitchen.. 1 tablet twice daily with food 4)  Ascensia Contour Test Strp (Glucose blood) .... Take as directed 5)  Adult Aspirin Low Strength 81 Mg Tbdp (Aspirin) 6)  Zoloft 50 Mg Tabs (Sertraline hcl) .... Take 1 tablet by mouth once a day 7)  Nasonex 50 Mcg/act Susp (Mometasone furoate) .... 2 sprays each nostril once a day 8)  Lisinopril 20 Mg Tabs (Lisinopril) .... Take 1 pill once daily. 9)  Pravachol 40 Mg Tabs (Pravastatin sodium) .... Take 1 tablet by mouth once a day 10)  Ventolin Hfa 108 (90 Base) Mcg/act Aers (Albuterol sulfate) .... Use 1-2 puffs every 4-6 hours as needed for shortness of breath 11)  Amlodipine Besylate 10 Mg Tabs (Amlodipine besylate) .... Take 1 tablet by mouth once a day 12)  Terazosin Hcl 2 Mg Caps (Terazosin hcl) .... Take 2 capsule by mouth once a day at bedtime 13)  Glucotrol 10 Mg Tabs (Glipizide) .... Take 2 tablets by mouth in the morning and 1 tablet by mouth in the evening. 14)  Humulin N 100 Unit/ml Susp (Insulin isophane human) .... Please dispense relion  humulin n insulin - inject 12 units at night  Patient Instructions: 1)  Please schedule a follow-up appointment in 3 months. 2)  Please schedule an appointment with your primary docto.: 3)  Tobacco is very bad for your health and your loved ones! You Should  stop smoking!. 4)  Stop Smoking Tips: Choose a Quit date. Cut down before the Quit date. decide what you will do as a substitute when you feel the urge to smoke(gum,toothpick,exercise). 5)  It is important that you exercise regularly at least 20 minutes 5 times a week. If you develop chest pain, have severe difficulty breathing, or feel very tired , stop exercising immediately and seek medical attention. 6)  You need to lose weight. Consider a lower calorie diet and regular exercise.  7)  Take an Aspirin every day. 8)  Check your blood sugars regularly. If your readings are usually above : or below 70 you should contact our office. 9)  Check your Blood Pressure regularly. If it is above: you should make an appointment.  Prevention & Chronic Care Immunizations   Influenza vaccine: Fluvax 3+  (07/24/2009)   Influenza vaccine due: 05/30/2011    Tetanus booster: 12/04/2009: Tdap   Tetanus booster due: 10/29/2017    Pneumococcal vaccine: Historical  (06/07/2006)   Pneumococcal vaccine due: 10/29/2010  Colorectal Screening   Hemoccult: Not documented   Hemoccult action/deferral: Refused  (12/18/2009)   Hemoccult due: 10/29/2010    Colonoscopy: Not documented   Colonoscopy action/deferral: Deferred  (12/18/2009)  Other Screening   PSA: 1.09  (05/07/2009)   Smoking status: current  (12/04/2009)   Smoking cessation counseling: yes  (12/18/2009)  Diabetes Mellitus   HgbA1C: 10.0  (11/04/2009)    Eye exam: No diabetic retinopathy.     (06/26/2009)   Eye exam due: 06/2010    Foot exam: Not documented   High risk foot: Not documented   Foot care education: Not documented    Urine microalbumin/creatinine ratio: 7.6   (10/08/2008)    Diabetes flowsheet reviewed?: Yes   Progress toward A1C goal: Improved  Lipids   Total Cholesterol: 206  (11/05/2009)   Lipid panel action/deferral: Deferred   LDL: 106  (11/05/2009)   LDL Direct: Not documented   HDL: 37  (11/05/2009)   Triglycerides: 316  (11/05/2009)    SGOT (AST): 34  (11/05/2009)   SGPT (ALT): 54  (11/05/2009)   Alkaline phosphatase: 106  (11/05/2009)   Total bilirubin: 0.5  (11/05/2009)    Lipid flowsheet reviewed?: Yes   Progress toward LDL goal: At goal  Hypertension   Last Blood Pressure: 127 / 77  (12/18/2009)   Serum creatinine: 0.87  (11/05/2009)   Serum potassium 4.5  (11/05/2009)    Hypertension flowsheet reviewed?: Yes   Progress toward BP goal: At goal  Self-Management Support :   Personal Goals (by the next clinic visit) :     Personal A1C goal: 7  (11/04/2009)     Personal blood pressure goal: 130/80  (11/04/2009)     Personal LDL goal: 100  (11/04/2009)    Diabetes self-management support: CBG self-monitoring log, Education handout, Pre-printed educational material, Written self-care plan  (12/18/2009)   Diabetes care plan printed   Diabetes education handout printed   Last diabetes self-management training by diabetes educator: 02/07/2009    Hypertension self-management support: Written self-care plan  (12/18/2009)   Hypertension self-care plan printed.    Lipid self-management support: Written self-care plan  (12/18/2009)   Lipid self-care plan printed.

## 2010-10-28 NOTE — Progress Notes (Signed)
Summary: med refill/gp  Phone Note Refill Request Message from:  Fax from Pharmacy on Feb 20, 2010 4:34 PM  Refills Requested: Medication #1:  PRAVACHOL 40 MG TABS Take 1 tablet by mouth once a day   Last Refilled: 12/04/2009 Last appt. 12/18/09.Labs 02/10/10.   Method Requested: Electronic Initial call taken by: Chinita Pester RN,  Feb 20, 2010 4:34 PM  Follow-up for Phone Call        Rx written Follow-up by: Acey Lav MD,  Feb 21, 2010 10:11 AM    Prescriptions: PRAVACHOL 40 MG TABS (PRAVASTATIN SODIUM) Take 1 tablet by mouth once a day  #31 x 11   Entered and Authorized by:   Acey Lav MD   Signed by:   Paulette Blanch Dam MD on 02/21/2010   Method used:   Electronically to        [facility] (retail)       [address]       [place], [place]  [address]       Ph: [phone]       Fax: [phone]   RxID:   [id number]

## 2010-10-28 NOTE — Assessment & Plan Note (Signed)
Summary: rash in groin area/TY   Primary Provider:  Blondell Reveal Jones  CC:  pt. c/o rash groin area and buttocks.  History of Present Illness: Pt c/o rash in groin and in buttock area that is somewhat itchy.  He has tried multiple antifungal creams and fluconazole without relief.  Depression History:      The patient is having a depressed mood most of the day and has a diminished interest in his usual daily activities.        The patient denies that he feels like life is not worth living, denies that he wishes that he were dead, and denies that he has thought about ending his life.        Preventive Screening-Counseling & Management  Alcohol-Tobacco     Alcohol drinks/day: 0     Alcohol type: rarely     Smoking Status: current     Smoking Cessation Counseling: yes     Smoke Cessation Stage: relapse     Packs/Day: 1.0     Year Started: ABOUT THE AGE OF 7     Passive Smoke Exposure: no  Caffeine-Diet-Exercise     Caffeine use/day: coffee,tea     Caffeine Counseling: decrease use of caffeine     Does Patient Exercise: yes     Type of exercise: walking     Exercise (avg: min/session): <30     Times/week: 5  Hep-HIV-STD-Contraception     HIV Risk: no  Safety-Violence-Falls     Seat Belt Use: yes     Seat Belt Counseling: not applicable     Firearms in the Home: firearms in the home     Smoke Detectors: yes      Drug Use:  never.        Blood Transfusions:  no.        Travel History:  no.     Updated Prior Medication List: ATRIPLA 600-200-300 MG TABS (EFAVIRENZ-EMTRICITAB-TENOFOVIR) Take 1 tablet by mouth once a day OMEPRAZOLE 20 MG CPDR (OMEPRAZOLE) Take 1 tablet by mouth once a day METFORMIN HCL 1000 MG TABS (METFORMIN HCL) 1 tablet twice daily with food ASCENSIA CONTOUR TEST   STRP (GLUCOSE BLOOD) Take as directed ADULT ASPIRIN LOW STRENGTH 81 MG  TBDP (ASPIRIN)  ZOLOFT 50 MG  TABS (SERTRALINE HCL) Take 1 tablet by mouth once a day NASONEX 50 MCG/ACT SUSP  (MOMETASONE FUROATE) 2 sprays each nostril once a day LISINOPRIL 20 MG TABS (LISINOPRIL) Take 1 pill once daily. PRAVACHOL 40 MG TABS (PRAVASTATIN SODIUM) Take 1 tablet by mouth once a day VENTOLIN HFA 108 (90 BASE) MCG/ACT AERS (ALBUTEROL SULFATE) Use 1-2 puffs every 4-6 hours as needed for shortness of breath AMLODIPINE BESYLATE 10 MG TABS (AMLODIPINE BESYLATE) Take 1 tablet by mouth once a day TERAZOSIN HCL 2 MG CAPS (TERAZOSIN HCL) Take 2 capsule by mouth once a day at bedtime GLUCOTROL 10 MG TABS (GLIPIZIDE) Take 2 tablets by mouth in the morning and 1 tablet by mouth in the evening. HUMULIN N 100 UNIT/ML SUSP (INSULIN ISOPHANE HUMAN) please dispense ReliOn Humulin N insulin - inject 12 units at night GLUCERNA  LIQD (NUTRITIONAL SUPPLEMENTS) one by mouth two times a day ACYCLOVIR 400 MG TABS (ACYCLOVIR) Take 1 tablet by mouth three times a day LAMISIL 250 MG TABS (TERBINAFINE HCL) Take 1 tablet by mouth once a day  Current Allergies: No known allergies  Past History:  Past Medical History: Last updated: 12/12/2007 Depression stable off meds Diabetes mellitus, type II -  well controlled Hypertension - well controlled off meds HIV disease - dxed 1995 after shingles followed by ID Dr. Sampson Goon Hyperlipidemia Rheumatoid arthritis:  followed by Dr. Kellie Simmering, off mtx and prednisione since 2007  Review of Systems  The patient denies fever, weight loss, abdominal pain, melena, and hematochezia.    Vital Signs:  Patient profile:   55 year old male Height:      69 inches (175.26 cm) Weight:      213.0 pounds (96.82 kg) BMI:     31.57 Temp:     98.2 degrees F (36.78 degrees C) oral Pulse rate:   86 / minute BP sitting:   141 / 74  (right arm)  Vitals Entered By: Wendall Mola CMA ( AAMA) (April 09, 2010 9:22 AM) CC: pt. c/o rash groin area and buttocks Is Patient Diabetic? Yes Did you bring your meter with you today? No Pain Assessment Patient in pain? no       Nutritional Status BMI of > 30 = obese Nutritional Status Detail appetite "not good"  Does patient need assistance? Functional Status Self care Ambulation Normal Comments no missed doses of meds per patient   Physical Exam  General:  alert, well-developed, well-nourished, and well-hydrated.   Head:  normocephalic and atraumatic.   Skin:  erythema in left groin area and in buttock region with small ulcerated lesion in butock area.   Impression & Recommendations:  Problem # 1:  SKIN RASH (ICD-782.1) does look fungal in etiology with possible herpes lesion will treat with lamisil and acyclovir Orders: Est. Patient Level III (10626)  Medications Added to Medication List This Visit: 1)  Acyclovir 400 Mg Tabs (Acyclovir) .... Take 1 tablet by mouth three times a day 2)  Lamisil 250 Mg Tabs (Terbinafine hcl) .... Take 1 tablet by mouth once a day Prescriptions: LAMISIL 250 MG TABS (TERBINAFINE HCL) Take 1 tablet by mouth once a day  #30 x 0   Entered and Authorized by:   Yisroel Ramming Jones   Signed by:   Yisroel Ramming Jones on 04/09/2010   Method used:   Print then Give to Patient   RxID:   [id number] ACYCLOVIR 400 MG TABS (ACYCLOVIR) Take 1 tablet by mouth three times a day  #21 x 0   Entered and Authorized by:   Yisroel Ramming Jones   Signed by:   Yisroel Ramming Jones on 04/09/2010   Method used:   Print then Give to Patient   RxID:   [id number]

## 2010-10-28 NOTE — Assessment & Plan Note (Signed)
Summary: chest pain/gg   see last note   Vital Signs:  Patient profile:   55 year old male Height:      69 inches Weight:      212.6 pounds BMI:     31.51 Temp:     98.5 degrees F oral Pulse rate:   80 / minute BP sitting:   115 / 69  Vitals Entered By: Filomena Jungling NT II (November 04, 2009 1:38 PM) CC: headaches, and neck and shoulder pain, eye fuzzy and blury Is Patient Diabetic? Yes Did you bring your meter with you today? Yes Pain Assessment Patient in pain? yes     Location: nec and head,  Intensity: 4 Type: aching CBG Result 243  Does patient need assistance? Functional Status Self care Ambulation Normal   Primary Care Provider:  Blondell Reveal MD  CC:  headaches, and neck and shoulder pain, and eye fuzzy and blury.  History of Present Illness: 55 y/o man with PMH as listed in EMR comes to the clinic complaining if headaches.   They have been going on since more than a year as per patient. Worse in last 2 months, Episodic. Relieved by themselves with few hours. occasionally takes tylenol. No aura like symptoms preceding the episodes. associated complaints of shoulder pain, chest discomfort, leg pain, nausea, palpations. dizziness, weakness and other non specific complaints  Has been taking his medications regularly.   Depression History:      The patient denies a depressed mood most of the day and a diminished interest in his usual daily activities.         Preventive Screening-Counseling & Management  Alcohol-Tobacco     Alcohol drinks/day: 0     Alcohol type: rarely     Smoking Status: current     Smoking Cessation Counseling: yes     Smoke Cessation Stage: relapse     Packs/Day: 1.0     Year Started: ABOUT THE AGE OF 7     Passive Smoke Exposure: no  Caffeine-Diet-Exercise     Caffeine use/day: coffee,tea     Caffeine Counseling: decrease use of caffeine     Does Patient Exercise: yes     Type of exercise: walking     Exercise (avg: min/session):  <30     Times/week: 5  Current Medications (verified): 1)  Atripla 600-200-300 Mg Tabs (Efavirenz-Emtricitab-Tenofovir) .... Take 1 Tablet By Mouth Once A Day 2)  Omeprazole 20 Mg Cpdr (Omeprazole) .... Take 1 Tablet By Mouth Once A Day 3)  Metformin Hcl 1000 Mg Tabs (Metformin Hcl) .Marland Kitchen.. 1 Tablet Twice Daily With Food 4)  Ascensia Contour Test   Strp (Glucose Blood) .... Take As Directed 5)  Adult Aspirin Low Strength 81 Mg  Tbdp (Aspirin) 6)  Zoloft 50 Mg  Tabs (Sertraline Hcl) .... Take 1/2 Tablet Daily For A Week Then Take One Tablet Daily 7)  Ibu 800 Mg  Tabs (Ibuprofen) .... Take 1 Tablet By Mouth Three Times A Day 8)  Nasonex 50 Mcg/act Susp (Mometasone Furoate) .... 2 Sprays Each Nostril Once A Day 9)  Lisinopril 20 Mg Tabs (Lisinopril) .... Take 1 Pill Once Daily. 10)  Pravachol 40 Mg Tabs (Pravastatin Sodium) .... Take 1 Tablet By Mouth Once A Day 11)  Ventolin Hfa 108 (90 Base) Mcg/act Aers (Albuterol Sulfate) .... Use 1-2 Puffs Every 4-6 Hours As Needed For Shortness of Breath 12)  Amlodipine Besylate 10 Mg Tabs (Amlodipine Besylate) .... Take 1 Tablet By Mouth Once  A Day 13)  Terazosin Hcl 2 Mg Caps (Terazosin Hcl) .... Take 2 Capsule By Mouth Once A Day At Bedtime 14)  Glucotrol 10 Mg Tabs (Glipizide) .... Take 2 Tablets By Mouth in The Morning and 1 Tablet By Mouth in The Evening.  Allergies (verified): No Known Drug Allergies  Physical Exam  General:  Well-developed,well-nourished,in no acute distress; alert,appropriate and cooperative throughout examination Head:  Normocephalic and atraumatic without obvious abnormalities. No apparent alopecia or balding. Eyes:  No corneal or conjunctival inflammation noted. EOMI. Perrla.  Mouth:  Oral mucosa and oropharynx without lesions or exudates.  Neck:  No deformities, masses, or tenderness noted. Lungs:  Normal respiratory effort, chest expands symmetrically. Lungs are clear to auscultation, no crackles or wheezes. Heart:  Normal  rate and regular rhythm. S1 and S2 normal without gallop, murmur, click, rub or other extra sounds. Abdomen:  Bowel sounds positive,abdomen soft and non-tender without masses, organomegaly or hernias noted. Msk:  No deformity or scoliosis noted of thoracic or lumbar spine.   Pulses:  R radial normal.  dorsalis pedis pulses normal bilaterally  Extremities:  No clubbing, cyanosis, edema, or deformity noted with normal full range of motion of all joints.   Neurologic:  alert & oriented X3, strength normal in all extremities, and gait normal.   Psych:  labile affect, tearful, easily distracted, and poor concentration.     Impression & Recommendations:  Problem # 1:  DEPRESSION (ICD-311) PAtient has not been taking zoloft since a year as he thinks he does not need to be on pschoactive medication and that he is afraid he will get addicted to it and also his friends/family will think he is crazy. Explained to him that these meds are not addictive and lot of his problems can be controlled if we try to control this one problem. Agrees to comply with taking celebrex daily for few months. If its not helping can increase the dose. Will also refer to mental health department if he is willing to it. He does not want to go this time and want to try zoloft 1st. Will check compliance to this meds and see response in a month,   His updated medication list for this problem includes:    Zoloft 50 Mg Tabs (Sertraline hcl) .Marland Kitchen... Take 1 tablet by mouth once a day  Future Orders: T-CBC w/Diff ([id number]) ... 11/05/2009  Discussed treatment options, including trial of antidpressant medication. Patient agrees to call if any worsening of symptoms or thoughts of doing harm arise. Verified that the patient has no suicidal ideation at this time.   Problem # 2:  HEADACHE (ICD-784.0) At this time, although his main complaint does not seem to be the primary issue. Its seems like a menifestation of depression. Will not  evaluate furhter at this time. If continues to have headaches will need to look for opportunisitc inf or other organic causes. Tylenol as needed   His updated medication list for this problem includes:    Adult Aspirin Low Strength 81 Mg Tbdp (Aspirin)    Ibu 800 Mg Tabs (Ibuprofen) .Marland Kitchen... Take 1 tablet by mouth three times a day  Problem # 3:  DIABETES MELLITUS, TYPE II (ICD-250.00) Poor control. Seems to be deteriorating in last six months, As per the patient he has been compiant with medication and trying to eat a healthy diet. Looking at his refill, last one was in 2009. He should be out of metformin on 05/2009. He still has almost 1 bottle of  metformin left. When asked further , he says he does not take his pills sometimes as he does not want them to get over soon. He has financial difficulties and has trouble getting meds. Explained to him the importnace of controlling DM. Am refilling his meds today. Will schedule an appointment with Lupita Leash as well. Will chech CMET. Will need better compliance to  control DM.   His updated medication list for this problem includes:    Metformin Hcl 1000 Mg Tabs (Metformin hcl) .Marland Kitchen... 1 tablet twice daily with food    Adult Aspirin Low Strength 81 Mg Tbdp (Aspirin)    Lisinopril 20 Mg Tabs (Lisinopril) .Marland Kitchen... Take 1 pill once daily.    Glucotrol 10 Mg Tabs (Glipizide) .Marland Kitchen... Take 2 tablets by mouth in the morning and 1 tablet by mouth in the evening.  Orders: T- Capillary Blood Glucose (16109) T-Hgb A1C (in-house) ([id number]) Diabetic Clinic Referral (Diabetic)Future Orders: T-Comprehensive Metabolic Panel ([id number]) ... 11/05/2009  Labs Reviewed: Creat: 0.87 (07/24/2009)     Last Eye Exam: No diabetic retinopathy.    (06/26/2009) Reviewed HgBA1c results: 10.0 (11/04/2009)  8.3 (07/24/2009)  Problem # 4:  HYPERTENSION (ICD-401.9) Controlled. Continue current plan.   His updated medication list for this problem includes:    Lisinopril 20 Mg Tabs  (Lisinopril) .Marland Kitchen... Take 1 pill once daily.    Amlodipine Besylate 10 Mg Tabs (Amlodipine besylate) .Marland Kitchen... Take 1 tablet by mouth once a day    Terazosin Hcl 2 Mg Caps (Terazosin hcl) .Marland Kitchen... Take 2 capsule by mouth once a day at bedtime  BP today: 115/69 Prior BP: 133/85 (08/13/2009)  Labs Reviewed: K+: 4.4 (07/24/2009) Creat: : 0.87 (07/24/2009)   Chol: 241 (10/08/2008)   HDL: 40 (10/08/2008)   LDL: 124 (10/08/2008)   TG: 385 (10/08/2008)  Problem # 5:  RHEUMATOID ARTHRITIS (ICD-714.0) Needt rheumatology consult for deciding if he needs to be on DMARD , awaiting Medicaid approval for him. Has morning pain in both wrists. Ibuprofen untill then.   Problem # 6:  HIV DISEASE (ICD-042) Well controlled. Followed by Dr. Sampson Goon. CD4 630 in 11/10. No signs of opportunistic inf.  On Atripla.   Complete Medication List: 1)  Atripla 600-200-300 Mg Tabs (Efavirenz-emtricitab-tenofovir) .... Take 1 tablet by mouth once a day 2)  Omeprazole 20 Mg Cpdr (Omeprazole) .... Take 1 tablet by mouth once a day 3)  Metformin Hcl 1000 Mg Tabs (Metformin hcl) .Marland Kitchen.. 1 tablet twice daily with food 4)  Ascensia Contour Test Strp (Glucose blood) .... Take as directed 5)  Adult Aspirin Low Strength 81 Mg Tbdp (Aspirin) 6)  Zoloft 50 Mg Tabs (Sertraline hcl) .... Take 1 tablet by mouth once a day 7)  Ibu 800 Mg Tabs (Ibuprofen) .... Take 1 tablet by mouth three times a day 8)  Nasonex 50 Mcg/act Susp (Mometasone furoate) .... 2 sprays each nostril once a day 9)  Lisinopril 20 Mg Tabs (Lisinopril) .... Take 1 pill once daily. 10)  Pravachol 40 Mg Tabs (Pravastatin sodium) .... Take 1 tablet by mouth once a day 11)  Ventolin Hfa 108 (90 Base) Mcg/act Aers (Albuterol sulfate) .... Use 1-2 puffs every 4-6 hours as needed for shortness of breath 12)  Amlodipine Besylate 10 Mg Tabs (Amlodipine besylate) .... Take 1 tablet by mouth once a day 13)  Terazosin Hcl 2 Mg Caps (Terazosin hcl) .... Take 2 capsule by mouth once a  day at bedtime 14)  Glucotrol 10 Mg Tabs (Glipizide) .... Take 2 tablets by mouth  in the morning and 1 tablet by mouth in the evening.  Other Orders: Future Orders: T-Lipid Profile ([id number]) ... 11/05/2009  Patient Instructions: 1)  Please schedule a follow-up appointment in 1 month. Schedule an appointment with Jamison Neighbor as well.  2)  Come early within  a week for labs. Come fasting at that time.  3)  Tobacco is very bad for your health and your loved ones! You Should stop smoking!. 4)  Stop Smoking Tips: Choose a Quit date. Cut down before the Quit date. decide what you will do as a substitute when you feel the urge to smoke(gum,toothpick,exercise). 5)  It is important that you exercise regularly at least 20 minutes 5 times a week. If you develop chest pain, have severe difficulty breathing, or feel very tired , stop exercising immediately and seek medical attention. 6)  You need to lose weight. Consider a lower calorie diet and regular exercise.  7)  Check your blood sugars regularly. If your readings are usually above : or below 70 you should contact our office. 8)  Check your feet each night for sore areas, calluses or signs of infection. Prescriptions: METFORMIN HCL 1000 MG TABS (METFORMIN HCL) 1 tablet twice daily with food  #60 x 3   Entered and Authorized by:   Bethel Born MD   Signed by:   Bethel Born MD on 11/04/2009   Method used:   Electronically to        [facility]* (retail)       [address]       [place], [place]  [address]       Ph: [phone]       Fax: [phone]   RxID:   [id number] ZOLOFT 50 MG  TABS (SERTRALINE HCL) take 1/2 tablet daily for a week then take one tablet daily  #30 x 3   Entered and Authorized by:   Bethel Born MD   Signed by:   Bethel Born MD on 11/04/2009   Method used:   Print then Give to Patient   RxID:   [id number] LISINOPRIL 20 MG TABS (LISINOPRIL) Take 1 pill once daily.  #30 x 6    Entered and Authorized by:   Bethel Born MD   Signed by:   Bethel Born MD on 11/04/2009   Method used:   Electronically to        [facility]* (retail)       [address]       [place], [place]  [id number]       Ph: [phone]       Fax: [phone]   RxID:   [id number] AMLODIPINE BESYLATE 10 MG TABS (AMLODIPINE BESYLATE) Take 1 tablet by mouth once a day  #31 x 5   Entered and Authorized by:   Bethel Born MD   Signed by:   Bethel Born MD on 11/04/2009   Method used:   Electronically to        [facility]* (retail)       [address]       [place], [place]  [id number]       Ph: [phone]       Fax: [phone]   RxID:   [id number] GLUCOTROL 10 MG TABS (GLIPIZIDE) Take 2 tablets by mouth in the morning and 1 tablet by mouth in the evening.  #60 x 3   Entered and Authorized by:   Bethel Born MD  Signed by:   Bethel Born MD on 11/04/2009   Method used:   Electronically to        [facility]* (retail)       [address]       [place], [place]  [address]       Ph: [phone]       Fax: [phone]   RxID:   [id number] GLUCOTROL 10 MG TABS (GLIPIZIDE) Take 2 tablets by mouth in the morning and 1 tablet by mouth in the evening.  #60 x 3   Entered and Authorized by:   Bethel Born MD   Signed by:   Bethel Born MD on 11/04/2009   Method used:   Electronically to        [facility]  [phone]* (retail)       [address]       [place], [place]  [address]       Ph: [phone] or [phone]       Fax: [phone]   RxID:   [id number] AMLODIPINE BESYLATE 10 MG TABS (AMLODIPINE BESYLATE) Take 1 tablet by mouth once a day  #31 x 5   Entered and Authorized by:   Bethel Born MD   Signed by:   Bethel Born MD on 11/04/2009   Method used:   Electronically to        [facility]  [phone]* (retail)       [address]       [place], [place]  [address]       Ph: [phone] or [phone]       Fax: [phone]   RxID:   [id number] LISINOPRIL 20 MG TABS (LISINOPRIL) Take 1 pill once daily.  #30 x 6   Entered and Authorized by:   Bethel Born MD   Signed by:   Bethel Born MD on 11/04/2009   Method used:   Electronically to        [facility]  [phone]* (retail)       [address]       [place], [place]  [address]       Ph: [phone] or [phone]       Fax: [phone]   RxID:   [id number]   Process Orders Check Orders Results:     [facility]: ABN not required for this insurance Order queued for requisitioning for [facility]: November 04, 2009 9:16 PM  Tests Sent for requisitioning (November 04, 2009 9:16 PM):     11/05/2009: [facility] -- T-Comprehensive Metabolic Panel [[id number]] (signed)     11/05/2009: [facility] -- T-Lipid Profile [id number] (signed)     11/05/2009: [facility] -- [facility] w/Diff [[id number]] (signed)    Prevention & Chronic Care Immunizations   Influenza vaccine: Fluvax 3+  (07/24/2009)   Influenza vaccine due: 05/30/2011    Tetanus booster: Not documented   Tetanus booster due: 10/29/2017    Pneumococcal vaccine: Historical  (06/07/2006)   Pneumococcal vaccine due: 10/29/2010  Colorectal Screening   Hemoccult: Not documented   Hemoccult due: 10/29/2010    Colonoscopy: Not documented   Colonoscopy action/deferral: Refused  (11/04/2009)  Other Screening   PSA: 1.09  (05/07/2009)   Smoking status: current  (11/04/2009)   Smoking cessation counseling: yes  (11/04/2009)  Diabetes Mellitus  HgbA1C: 10.0  (11/04/2009)    Eye exam: No diabetic retinopathy.     (06/26/2009)   Eye exam due: 06/2010    Foot exam: Not documented   High risk foot: Not documented   Foot care education: Not documented    Urine  microalbumin/creatinine ratio: 7.6  (10/08/2008)    Diabetes flowsheet reviewed?: Yes   Progress toward A1C goal: Deteriorated  Lipids   Total Cholesterol: 241  (10/08/2008)   Lipid panel action/deferral: Deferred   LDL: 124  (10/08/2008)   LDL Direct: Not documented   HDL: 40  (10/08/2008)   Triglycerides: 385  (10/08/2008)    SGOT (AST): 64  (07/24/2009)   SGPT (ALT): 83  (07/24/2009) CMP ordered    Alkaline phosphatase: 98  (07/24/2009)   Total bilirubin: 0.6  (07/24/2009)    Lipid flowsheet reviewed?: Yes   Progress toward LDL goal: Unchanged  Hypertension   Last Blood Pressure: 115 / 69  (11/04/2009)   Serum creatinine: 0.87  (07/24/2009)   Serum potassium 4.4  (07/24/2009) CMP ordered     Hypertension flowsheet reviewed?: Yes   Progress toward BP goal: Improved  Self-Management Support :   Personal Goals (by the next clinic visit) :     Personal A1C goal: 7  (11/04/2009)     Personal blood pressure goal: 130/80  (11/04/2009)     Personal LDL goal: 100  (11/04/2009)    Patient will work on the following items until the next clinic visit to reach self-care goals:     Medications and monitoring: take my medicines every day, check my blood sugar  (11/04/2009)     Eating: use fresh or frozen vegetables, eat foods that are low in salt, eat baked foods instead of fried foods, eat fruit for snacks and desserts  (11/04/2009)    Diabetes self-management support: Copy of home glucose meter record, Written self-care plan  (11/04/2009)   Diabetes care plan printed   Last diabetes self-management training by diabetes educator: 02/07/2009   Referred for diabetes self-mgmt training.    Hypertension self-management support: Written self-care plan  (11/04/2009)   Hypertension self-care plan printed.    Lipid self-management support: Written self-care plan  (11/04/2009)   Lipid self-care plan printed.   Laboratory Results   Blood Tests   Date/Time Received: November 04, 2009 2:14 PM Date/Time Reported: Alric Quan  November 04, 2009 2:14 PM   HGBA1C: 10.0%   (Normal Range: Non-Diabetic - 3-6%   Control Diabetic - 6-8%) CBG Random:: 243mg /dL

## 2010-10-28 NOTE — Progress Notes (Signed)
Summary: refill/ hla  Phone Note Refill Request Message from:  Fax from Pharmacy on Feb 21, 2010 5:28 PM  Refills Requested: Medication #1:  GLUCOTROL 10 MG TABS Take 2 tablets by mouth in the morning and 1 tablet by mouth in the evening.   Last Refilled: 3/28  Medication #2:  METFORMIN HCL 1000 MG TABS 1 tablet twice daily with food   Last Refilled: 3/28 Initial call taken by: Marin Roberts RN,  Feb 21, 2010 5:28 PM  Follow-up for Phone Call       Follow-up by: Blondell Reveal MD,  Feb 24, 2010 3:12 PM    Prescriptions: GLUCOTROL 10 MG TABS (GLIPIZIDE) Take 2 tablets by mouth in the morning and 1 tablet by mouth in the evening.  #60 x 6   Entered and Authorized by:   Blondell Reveal MD   Signed by:   Blondell Reveal MD on 02/24/2010   Method used:   Electronically to        [facility]* (retail)       [address]       [place], [place]  [address]       Ph: [phone]       Fax: [phone]   RxID:   [id number] METFORMIN HCL 1000 MG TABS (METFORMIN HCL) 1 tablet twice daily with food  #60 x 6   Entered and Authorized by:   Blondell Reveal MD   Signed by:   Blondell Reveal MD on 02/24/2010   Method used:   Electronically to        [facility]* (retail)       [address]       [place], [place]  [id number]       Ph: [phone]       Fax: [phone]   RxID:   [id number]

## 2010-10-28 NOTE — Miscellaneous (Signed)
Summary: clinical update/ryan white NCADAP apprv til 12/27/10  Clinical Lists Changes  Observations: Added new observation of AIDSDAP: Yes 2011 (01/24/2010 12:04)

## 2010-10-28 NOTE — Progress Notes (Signed)
Summary: Medication change  Phone Note Refill Request   Refills Requested: Medication #1:  TERAZOSIN HCL 2 MG CAPS 1/2 tablet a day for 3 days then one by mouth once daily for 2 weeks then  2 by mouth once daily until seen in followup Medication does not come in tablet form.  Please readjust the capsules cannot be halfed.   Method Requested: Electronic Initial call taken by: Angelina Ok RN,  October 25, 2009 2:37 PM  Follow-up for Phone Call        Recommend taking one full capsule at bed time. Follow-up by: Blondell Reveal MD,  October 27, 2009 2:47 AM    New/Updated Medications: TERAZOSIN HCL 2 MG CAPS (TERAZOSIN HCL) Take 1 capsule by mouth once a day at bedtime Prescriptions: TERAZOSIN HCL 2 MG CAPS (TERAZOSIN HCL) Take 1 capsule by mouth once a day at bedtime  #30 x 3   Entered and Authorized by:   Blondell Reveal MD   Signed by:   Blondell Reveal MD on 10/27/2009   Method used:   Electronically to        [facility]  [phone]* (retail)       [address]       [place], [place]  [address]       Ph: [phone] or [phone]       Fax: [phone]   RxID:   [id number]

## 2010-10-28 NOTE — Miscellaneous (Signed)
  Clinical Lists Changes  Observations: Added new observation of YEARAIDSPOS: 2004  (08/18/2010 11:12)

## 2010-10-28 NOTE — Assessment & Plan Note (Signed)
Summary: 2WK F/U/VS   Primary Provider:  Blondell Reveal MD  CC:  2 week follow up.  History of Present Illness: Riley Jones is a 55 year old man who has well controlled HIV, DM II and other med probelms as described in emr.  Here for routine HIV follow up.  Doing well and is following with [facility] for his DM and other medical problems.  Preventive Screening-Counseling & Management  Alcohol-Tobacco     Alcohol drinks/day: 0     Smoking Status: current     Smoking Cessation Counseling: yes     Smoke Cessation Stage: relapse     Packs/Day: 1.0     Year Started: ABOUT THE AGE OF 7     Passive Smoke Exposure: no  Caffeine-Diet-Exercise     Caffeine use/day: coffee,tea     Caffeine Counseling: decrease use of caffeine     Does Patient Exercise: yes     Type of exercise: walking     Exercise (avg: min/session): <30     Times/week: 5  Safety-Violence-Falls     Seat Belt Use: yes   Updated Prior Medication List: ATRIPLA 600-200-300 MG TABS (EFAVIRENZ-EMTRICITAB-TENOFOVIR) Take 1 tablet by mouth once a day OMEPRAZOLE 20 MG CPDR (OMEPRAZOLE) Take 1 tablet by mouth once a day METFORMIN HCL 1000 MG TABS (METFORMIN HCL) 1 tablet twice daily with food ASCENSIA CONTOUR TEST   STRP (GLUCOSE BLOOD) Take as directed ADULT ASPIRIN LOW STRENGTH 81 MG  TBDP (ASPIRIN)  ZOLOFT 50 MG  TABS (SERTRALINE HCL) Take 1 tablet by mouth once a day NASONEX 50 MCG/ACT SUSP (MOMETASONE FUROATE) 2 sprays each nostril once a day LISINOPRIL 20 MG TABS (LISINOPRIL) Take 1 pill once daily. PRAVACHOL 40 MG TABS (PRAVASTATIN SODIUM) Take 1 tablet by mouth once a day VENTOLIN HFA 108 (90 BASE) MCG/ACT AERS (ALBUTEROL SULFATE) Use 1-2 puffs every 4-6 hours as needed for shortness of breath AMLODIPINE BESYLATE 10 MG TABS (AMLODIPINE BESYLATE) Take 1 tablet by mouth once a day TERAZOSIN HCL 2 MG CAPS (TERAZOSIN HCL) Take 2 capsule by mouth once a day at bedtime GLUCOTROL 10 MG TABS (GLIPIZIDE) Take 2 tablets by mouth in  the morning and 1 tablet by mouth in the evening. HUMULIN N 100 UNIT/ML SUSP (INSULIN ISOPHANE HUMAN) please dispense ReliOn Humulin N insulin - inject 12 units at night  Current Allergies (reviewed today): No known allergies  Past History:  Past Medical History: Last updated: 12/12/2007 Depression stable off meds Diabetes mellitus, type II - well controlled Hypertension - well controlled off meds HIV disease - dxed 1995 after shingles followed by ID Dr. Sampson Goon Hyperlipidemia Rheumatoid arthritis:  followed by Dr. Kellie Simmering, off mtx and prednisione since 2007  Family History: Last updated: 07/19/2007   Social History: Last updated: 10/22/2008 Occupation: retired [employer] architect Current Smoker cut down to >1/2 pack per day. Alcohol use-yes occassional Drug use-no Regular exercise-yes walks (2-3 times per week) Not sexually active  -  On disability - approved  Risk Factors: Alcohol Use: 0 (02/25/2010) Caffeine Use: coffee,tea (02/25/2010) Exercise: yes (02/25/2010)  Risk Factors: Smoking Status: current (02/25/2010) Packs/Day: 1.0 (02/25/2010) Passive Smoke Exposure: no (02/25/2010)  Review of Systems       11 systems reviewed and negative except per HPI   Vital Signs:  Patient profile:   55 year old male Height:      69 inches (175.26 cm) Weight:      214.12 pounds (97.33 kg) BMI:     31.73 Temp:  97.4 degrees F (36.33 degrees C) oral Pulse rate:   74 / minute BP sitting:   132 / 75  (left arm)  Vitals Entered By: Baxter Hire) (Feb 25, 2010 2:35 PM) CC: 2 week follow up Pain Assessment Patient in pain? no      Nutritional Status BMI of > 30 = obese Nutritional Status Detail appetite is not good per patient  Have you ever been in a relationship where you felt threatened, hurt or afraid?No   Does patient need assistance? Functional Status Self care Ambulation Normal   Physical Exam  General:  alert, well-developed, and well-nourished.    Head:  normocephalic and atraumatic.   Eyes:  vision grossly intact, pupils equal, and pupils round.   Ears:  R ear normal and L ear normal.   Mouth:  fair dentition.   Neck:  supple.   Lungs:  normal respiratory effort, no intercostal retractions, and no accessory muscle use.   Heart:  normal rate, regular rhythm, and no murmur.   Abdomen:  soft and non-tender.   Msk:  normal ROM and no joint tenderness.   Extremities:  no cce Neurologic:  alert & oriented X3 and cranial nerves II-XII intact.          Medication Adherence: 02/25/2010   Adherence to medications reviewed with patient. Counseling to provide adequate adherence provided   Prevention For Positives: 02/25/2010   Safe sex practices discussed with patient. Condoms offered.                             Impression & Recommendations:  Problem # 1:  HIV INFECTION (ICD-042) Donig great.  He says he mises a few doses a month and I have encouraged him to try to be more regular. Follow up 6 weeks.   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 Patient Level IV (99214)Future Orders: T-CD4SP ([facility]) (CD4SP) ... 08/24/2010 T-HIV Viral Load [id number]) ... 08/24/2010 T-CBC w/Diff ([id number]) ... 08/24/2010 T-Comprehensive Metabolic Panel [id number]) ... 08/24/2010 T-RPR (Syphilis) [id number]) ... 08/24/2010  Problem # 2:  PREVENTIVE HEALTH CARE (ICD-V70.0)  Reviewed preventive care protocols, scheduled due services, and updated immunizations.  Problem # 3:  DIABETES MELLITUS, TYPE II (ICD-250.00) Following with [facility] His updated medication list for this problem includes:    Metformin Hcl 1000 Mg Tabs (Metformin hcl) .Marland Kitchen... 1 tablet twice daily with food    Adult Aspirin Low  Strength 81 Mg Tbdp (Aspirin)    Lisinopril 20 Mg Tabs (Lisinopril) .Marland Kitchen... Take 1 pill once daily.    Glucotrol 10 Mg Tabs (Glipizide) .Marland Kitchen... Take 2 tablets by mouth in the morning and 1 tablet by mouth in the evening.    Humulin N 100 Unit/ml Susp (Insulin isophane human) .Marland Kitchen... Please dispense relion humulin n insulin - inject 12 units at night  Labs Reviewed: Creat: 0.91 (02/10/2010)     Last Eye Exam: No diabetic retinopathy.    (06/26/2009) Reviewed HgBA1c results: 10.0 (11/04/2009)  8.3 (07/24/2009)  Patient Instructions: 1)  Please schedule a follow-up appointment in 6 months.  Appended Document: 2WK F/U/VS Also c/o L groin fungal infeciton on exam in his l inguinal crease he has fungal dermatitis. WIll give  fluconazole 2 14 days   Clinical Lists Changes  Medications: Added new medication of DIFLUCAN 100 MG TABS (FLUCONAZOLE) one by mouth once daily for 14 days - Signed Rx of DIFLUCAN 100 MG TABS (FLUCONAZOLE) one by mouth once daily for 14 days;  #14 x 0;  Signed;  Entered by: Clydie Braun MD;  Authorized by: Clydie Braun MD;  Method used: Electronically to [facility]  [phone]*, [address], [place], [place], [place]  [address], Ph: [phone] or [phone], Fax: [phone]    Prescriptions: DIFLUCAN 100 MG TABS (FLUCONAZOLE) one by mouth once daily for 14 days  #14 x 0   Entered and Authorized by:   Clydie Braun MD   Signed by:   Clydie Braun MD on 02/25/2010   Method used:   Electronically to        [facility]  [phone]* (retail)       [address]       [place], [place]  [address]       Ph: [phone] or [phone]       Fax: [phone]   RxID:   [id number]    Appended Document: 2WK F/U/VS    Clinical Lists Changes  Medications: Added new medication of GLUCERNA  LIQD (NUTRITIONAL SUPPLEMENTS) one by mouth two times a day - Signed Rx of GLUCERNA  LIQD (NUTRITIONAL  SUPPLEMENTS) one by mouth two times a day;  #60 x 1;  Signed;  Entered by: Clydie Braun MD;  Authorized by: Clydie Braun MD;  Method used: Print then Give to Patient    Prescriptions: GLUCERNA  LIQD (NUTRITIONAL SUPPLEMENTS) one by mouth two times a day  #60 x 1   Entered and Authorized by:   Clydie Braun MD   Signed by:   Clydie Braun MD on 02/25/2010   Method used:   Print then Give to Patient   RxID:   [id number]

## 2010-10-28 NOTE — Progress Notes (Signed)
Summary: phone/gg  Phone Note Call from Patient   Caller: Patient Summary of Call: Pt c/o hearaches, left eye goes blurry, on and off.  Pain to left arm up and down,   also c/o SOB and feels like someone is poking chest with needle.  THis is not new, has been going on for months. This was discussed at last Office Visit. Will see pt today.  Pt is pain free at this time. Initial call taken by: Merrie Roof RN,  November 04, 2009 10:17 AM  Follow-up for Phone Call        Discussed with RN. Agree. Follow-up by: Zoila Shutter MD,  November 04, 2009 10:37 AM

## 2010-10-28 NOTE — Progress Notes (Signed)
Summary: cannot afford insulin/dmr  Phone Note Call from Patient Call back at Home Phone [phone]   Caller: Patient Summary of Call: cannot afford 68 $ that the insulin doctor prescribed, got Novolog flexpen from niece- can he take this instead. Told pateint that he needs ot be tkaing NPH 10 units at bedtiemn tand [facility] has a relipn Humulin NPH that shoul dabe  ~ 25-30 dollars. he said he can scrape this up. He said we can sent this Rx to [facility] on [address]- will ask triage nurse to obtain corrected insulin order 10 units at beditme- reliOn Humulin N insulin and call patient. He has syringes and understand ds ot chekc mornign blood sugar to know how insulin is working. Initial call taken by: Jamison Neighbor RD,CDE,  December 06, 2009 3:19 PM  Follow-up for Phone Call        I will put in the refill for 10 units relion n humulin as suggested by Lupita Leash. Follow-up by: Lars Mage MD,  December 06, 2009 3:53 PM    New/Updated Medications: HUMULIN N 100 UNIT/ML SUSP (INSULIN ISOPHANE HUMAN) please dispense ReliOn Humulin N insulin - inject 10 units at night Prescriptions: HUMULIN N 100 UNIT/ML SUSP (INSULIN ISOPHANE HUMAN) please dispense ReliOn Humulin N insulin - inject 10 units at night  #1 mth supply x 2   Entered and Authorized by:   Lars Mage MD   Signed by:   Lars Mage MD on 12/06/2009   Method used:   Electronically to        [facility]  [phone]* (retail)       [address]       [place], [place]  [address]       Ph: [phone] or [phone]       Fax: [phone]   RxID:   [id number]

## 2010-10-28 NOTE — Assessment & Plan Note (Signed)
Summary: 52month f/u/boggalal/vs   Vital Signs:  Patient profile:   55 year old male Height:      69 inches (175.26 cm) Weight:      213.5 pounds (97.05 kg) BMI:     31.64 Temp:     97.2 degrees F (36.22 degrees C) oral Pulse rate:   64 / minute BP sitting:   124 / 70  (right arm) Cuff size:   large  Vitals Entered By: Krystal Eaton Duncan Dull) (December 04, 2009 8:34 AM) CC: f/u diabetes, c/o back pain and right , "sore on belly" x , left side abd pain, f/u depression after restarting zoloft Is Patient Diabetic? Yes Did you bring your meter with you today? Yes Pain Assessment Patient in pain? yes     Location: right foot/back Intensity: 3 Type: sharp Onset of pain  Intermittent for a while, but now pain worse Nutritional Status BMI of > 30 = obese  Have you ever been in a relationship where you felt threatened, hurt or afraid?No   Does patient need assistance? Functional Status Self care Ambulation Normal   Primary Care Provider:  Blondell Reveal MD  CC:  f/u diabetes, c/o back pain and right , "sore on belly" x , left side abd pain, and f/u depression after restarting zoloft.  History of Present Illness: 55 yo man with pmh as described in EMR is here today for a routine check up.   Patient thinks that his mood is definitely improving after starting with zoloft. He complains of abdominal pain which is described more like a soreness in the left upper quadrant, no radiation and present all the time for last 2 months with no aggravating or relieving factors, comes by it self. Not a/w food.  Also complains of right fot pain which is described as sharp aching pain aggravated by movement and relieved with rest. Still smoking half a pack a day and not interested in quitting. No other complaints.     Depression History:      The patient denies a depressed mood most of the day and a diminished interest in his usual daily activities.         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) 3)  Urinary Frequency  (ICD-788.41) 4)  Preventive Health Care  (ICD-V70.0) 5)  Dyspnea On Exertion  (ICD-786.09) 6)  Angina, Atypical  (ICD-413.9) 7)  Acute Sinusitis, Unspecified  (ICD-461.9) 8)  Abdominal Rigidity Right Lower Quadrant  (ICD-789.43) 9)  Disability Examination  (ICD-V68.01) 10)  Paresthesia, Hands  (ICD-782.0) 11)  Diarrhea  (ICD-787.91) 12)  Acute Bronchitis  (ICD-466.0) 13)  Preventive Health Care  (ICD-V70.0) 14)  Arthropathy Nos, Shoulder  (ICD-716.91) 15)  Urticaria Nos  (ICD-708.9) 16)  Testosterone Deficiency  (ICD-257.2) 17)  Insect Bite  (ICD-919.4) 18)  Tobacco User  (ICD-305.1) 19)  Back Pain, Thoracic Region  (ICD-724.1) 20)  Rheumatoid Arthritis  (ICD-714.0) 21)  Hyperlipidemia  (ICD-272.4) 22)  HIV Disease  (ICD-042) 23)  HIV Infection  (ICD-042) 24)  Carpal Tunnel Syndrome  (  ICD-354.0) 25)  Arthritis, Rheumatoid  (ICD-714.0) 26)  Hypertension  (ICD-401.9) 27)  Diabetes Mellitus, Type II  (ICD-250.00) 28)  Depression  (ICD-311)  Medications Prior to Update: 1)  Atripla 600-200-300 Mg Tabs (Efavirenz-Emtricitab-Tenofovir) .... Take 1 Tablet By Mouth Once A Day 2)  Omeprazole 20 Mg Cpdr (Omeprazole) .... Take 1 Tablet By Mouth Once A Day 3)  Metformin Hcl 1000 Mg Tabs (Metformin Hcl) .Marland Kitchen.. 1 Tablet Twice Daily With Food 4)  Ascensia Contour Test   Strp (Glucose Blood) .... Take As Directed 5)  Adult Aspirin Low Strength 81 Mg  Tbdp (Aspirin) 6)  Zoloft 50 Mg  Tabs (Sertraline Hcl) .... Take 1 Tablet By Mouth Once A Day 7)  Ibu 800 Mg  Tabs (Ibuprofen) .... Take 1  Tablet By Mouth Three Times A Day 8)  Nasonex 50 Mcg/act Susp (Mometasone Furoate) .... 2 Sprays Each Nostril Once A Day 9)  Lisinopril 20 Mg Tabs (Lisinopril) .... Take 1 Pill Once Daily. 10)  Pravachol 40 Mg Tabs (Pravastatin Sodium) .... Take 1 Tablet By Mouth Once A Day 11)  Ventolin Hfa 108 (90 Base) Mcg/act Aers (Albuterol Sulfate) .... Use 1-2 Puffs Every 4-6 Hours As Needed For Shortness of Breath 12)  Amlodipine Besylate 10 Mg Tabs (Amlodipine Besylate) .... Take 1 Tablet By Mouth Once A Day 13)  Terazosin Hcl 2 Mg Caps (Terazosin Hcl) .... Take 2 Capsule By Mouth Once A Day At Bedtime 14)  Glucotrol 10 Mg Tabs (Glipizide) .... Take 2 Tablets By Mouth in The Morning and 1 Tablet By Mouth in The Evening.  Current Medications (verified): 1)  Atripla 600-200-300 Mg Tabs (Efavirenz-Emtricitab-Tenofovir) .... Take 1 Tablet By Mouth Once A Day 2)  Omeprazole 20 Mg Cpdr (Omeprazole) .... Take 1 Tablet By Mouth Once A Day 3)  Metformin Hcl 1000 Mg Tabs (Metformin Hcl) .Marland Kitchen.. 1 Tablet Twice Daily With Food 4)  Ascensia Contour Test   Strp (Glucose Blood) .... Take As Directed 5)  Adult Aspirin Low Strength 81 Mg  Tbdp (Aspirin) 6)  Zoloft 50 Mg  Tabs (Sertraline Hcl) .... Take 1 Tablet By Mouth Once A Day 7)  Ibu 800 Mg  Tabs (Ibuprofen) .... Take 1 Tablet By Mouth Three Times A Day 8)  Nasonex 50 Mcg/act Susp (Mometasone Furoate) .... 2 Sprays Each Nostril Once A Day 9)  Lisinopril 20 Mg Tabs (Lisinopril) .... Take 1 Pill Once Daily. 10)  Pravachol 40 Mg Tabs (Pravastatin Sodium) .... Take 1 Tablet By Mouth Once A Day 11)  Ventolin Hfa 108 (90 Base) Mcg/act Aers (Albuterol Sulfate) .... Use 1-2 Puffs Every 4-6 Hours As Needed For Shortness of Breath 12)  Amlodipine Besylate 10 Mg Tabs (Amlodipine Besylate) .... Take 1 Tablet By Mouth Once A Day 13)  Terazosin Hcl 2 Mg Caps (Terazosin Hcl) .... Take 2 Capsule By Mouth Once A Day At Bedtime 14)  Glucotrol 10 Mg Tabs (Glipizide) .... Take 2  Tablets By Mouth in The Morning and 1 Tablet By Mouth in The Evening. 15)  Novolin 70/30 70-30 % Susp (Insulin Isophane & Regular) .... Inject 10 Units At Night 16)  Voltaren 1 % Gel (Diclofenac Sodium) .... Apply To Affected Area 3-4 Times A Day. 17)  Omeprazole 20 Mg Cpdr (Omeprazole) .... Take 1 Tablet By Mouth Once A Day  Allergies (verified): No Known Drug Allergies  Past History:  Past Medical History: Last updated: 12/12/2007 Depression stable off meds Diabetes mellitus, type II - well controlled Hypertension - well controlled  off meds HIV disease - dxed 1995 after shingles followed by ID Dr. Sampson Goon Hyperlipidemia Rheumatoid arthritis:  followed by Dr. Kellie Simmering, off mtx and prednisione since 2007  Family History: Last updated: 07/19/2007 [place]  Social History: Last updated: 10/22/2008 Occupation: retired [employer] architect Current Smoker cut down to >1/2 pack per day. Alcohol use-yes occassional Drug use-no Regular exercise-yes walks (2-3 times per week) Not sexually active  -  On disability - approved  Risk Factors: Alcohol Use: 0 (12/04/2009) Caffeine Use: coffee,tea (11/04/2009) Exercise: yes (11/04/2009)  Risk Factors: Smoking Status: current (12/04/2009) Packs/Day: 0.5 (12/04/2009) Passive Smoke Exposure: no (12/04/2009)  Social History: Packs/Day:  0.5  Review of Systems      See HPI  Physical Exam  Additional Exam:  Gen: AOx3, in no acute distress Eyes: PERRL, EOMI ENT:MMM, No erythema noted in posterior pharynx Neck: No JVD, No LAP Chest: CTAB with  good respiratory effort CVS: regular rhythmic rate, NO M/R/G, S1 S2 normal Abdo: soft,distended due to fat, BS+x4, Non tender and No hepatosplenomegaly EXT: No odema noted, tenderness positive in right forefoot. no redness or swelling noted Neuro: Non focal, gait is normal Skin: no rashes noted.    Impression & Recommendations:  Problem # 1:  FOOT PAIN, RIGHT (ICD-729.5) Assessment New Patient  has rheumatoid artritis. Though it is very unlikely to involve a small joint in foot but we will check an Xray to make sure that there is no bony destruction apparent on Xray. I discussed conservative management like local application of voltarin gel along with warm compreses to help relieve the symptoms. The differential could be any where from rheumatoid Artritis, planter fascitis, injury or diabetic neuropathy. Orders: Radiology other (Radiology Other)  Problem # 2:  TOBACCO USER (ICD-305.1) Assessment: Unchanged  Encouraged smoking cessation and discussed different methods for smoking cessation. Denies trying to quit at this time.  Problem # 3:  HYPERTENSION (ICD-401.9) Assessment: Unchanged BP is well controlled today. No change in meds required. His updated medication list for this problem includes:    Lisinopril 20 Mg Tabs (Lisinopril) .Marland Kitchen... Take 1 pill once daily.    Amlodipine Besylate 10 Mg Tabs (Amlodipine besylate) .Marland Kitchen... Take 1 tablet by mouth once a day    Terazosin Hcl 2 Mg Caps (Terazosin hcl) .Marland Kitchen... Take 2 capsule by mouth once a day at bedtime  BP today: 124/70 Prior BP: 115/69 (11/04/2009)  Labs Reviewed: K+: 4.5 (11/05/2009) Creat: : 0.87 (11/05/2009)   Chol: 206 (11/05/2009)   HDL: 37 (11/05/2009)   LDL: 106 (11/05/2009)   TG: 316 (11/05/2009)  Problem # 4:  DIABETES MELLITUS, TYPE II (ICD-250.00) Assessment: Deteriorated Reviewed his glucometer and all the readings were above 200 with the exceptuon of 2 reading in 100's. Riley Jones was already in the room when I was about to start my visist and had already discussed his HBa1c and need for insulin. We agreed to start him on 10 units of 70/30 at night as his morning sugars are the worst. We will follow up after 2 weeks and have asked him to keep a log of CBG's for next 2 weeks. Last Hba1c is 10. His updated medication list for this problem includes:    Metformin Hcl 1000 Mg Tabs (Metformin hcl) .Marland Kitchen... 1 tablet twice  daily with food    Adult Aspirin Low Strength 81 Mg Tbdp (Aspirin)    Lisinopril 20 Mg Tabs (Lisinopril) .Marland Kitchen... Take 1 pill once daily.    Glucotrol 10 Mg Tabs (Glipizide) .Marland Kitchen... Take 2  tablets by mouth in the morning and 1 tablet by mouth in the evening.    Novolin 70/30 70-30 % Susp (Insulin isophane & regular) ..... Inject 10 units at night  Labs Reviewed: Creat: 0.87 (11/05/2009)     Last Eye Exam: No diabetic retinopathy.    (06/26/2009) Reviewed HgBA1c results: 10.0 (11/04/2009)  8.3 (07/24/2009)  Problem # 5:  DEPRESSION (ICD-311) No SI or HI at thsi time and feels that medicine has started working. His updated medication list for this problem includes:    Zoloft 50 Mg Tabs (Sertraline hcl) .Marland Kitchen... Take 1 tablet by mouth once a day  Discussed treatment options, including trial of antidpressant medication. Will refer to behavioral health. Follow-up call in in 24-48 hours and recheck in 2 weeks, sooner as needed. Patient agrees to call if any worsening of symptoms or thoughts of doing harm arise. Verified that the patient has no suicidal ideation at this time.   Problem # 6:  Preventive Health Care (ICD-V70.0) Tetanus shot given. Not intereste din other preventive acre at this time as he is waiting for his Medicare to kick in. Reviewed preventive care protocols, scheduled due services, and updated immunizations.  Complete Medication List: 1)  Atripla 600-200-300 Mg Tabs (Efavirenz-emtricitab-tenofovir) .... Take 1 tablet by mouth once a day 2)  Omeprazole 20 Mg Cpdr (Omeprazole) .... Take 1 tablet by mouth once a day 3)  Metformin Hcl 1000 Mg Tabs (Metformin hcl) .Marland Kitchen.. 1 tablet twice daily with food 4)  Ascensia Contour Test Strp (Glucose blood) .... Take as directed 5)  Adult Aspirin Low Strength 81 Mg Tbdp (Aspirin) 6)  Zoloft 50 Mg Tabs (Sertraline hcl) .... Take 1 tablet by mouth once a day 7)  Ibu 800 Mg Tabs (Ibuprofen) .... Take 1 tablet by mouth three times a day 8)  Nasonex  50 Mcg/act Susp (Mometasone furoate) .... 2 sprays each nostril once a day 9)  Lisinopril 20 Mg Tabs (Lisinopril) .... Take 1 pill once daily. 10)  Pravachol 40 Mg Tabs (Pravastatin sodium) .... Take 1 tablet by mouth once a day 11)  Ventolin Hfa 108 (90 Base) Mcg/act Aers (Albuterol sulfate) .... Use 1-2 puffs every 4-6 hours as needed for shortness of breath 12)  Amlodipine Besylate 10 Mg Tabs (Amlodipine besylate) .... Take 1 tablet by mouth once a day 13)  Terazosin Hcl 2 Mg Caps (Terazosin hcl) .... Take 2 capsule by mouth once a day at bedtime 14)  Glucotrol 10 Mg Tabs (Glipizide) .... Take 2 tablets by mouth in the morning and 1 tablet by mouth in the evening. 15)  Novolin 70/30 70-30 % Susp (Insulin isophane & regular) .... Inject 10 units at night 16)  Voltaren 1 % Gel (Diclofenac sodium) .... Apply to affected area 3-4 times a day. 17)  Omeprazole 20 Mg Cpdr (Omeprazole) .... Take 1 tablet by mouth once a day  Other Orders: Tdap => 73yrs IM ([address]) Admin 1st Vaccine (60454)  Patient Instructions: 1)  Please schedule a follow-up appointment in 2 weeks. 2)  Tobacco is very bad for your health and your loved ones! You Should stop smoking!. 3)  Stop Smoking Tips: Choose a Quit date. Cut down before the Quit date. decide what you will do as a substitute when you feel the urge to smoke(gum,toothpick,exercise). 4)  It is important that you exercise regularly at least 20 minutes 5 times a week. If you develop chest pain, have severe difficulty breathing, or feel very tired , stop exercising immediately  and seek medical attention. 5)  You need to lose weight. Consider a lower calorie diet and regular exercise.  6)  Schedule a colonoscopy/sigmoidoscopy to help detect colon cancer. 7)  Complete your Hemocult cards and return them soon. 8)  Check your blood sugars regularly. If your readings are usually above : 200 or below 70 you should contact our office. 9)  It is important that your Diabetic  A1c level is checked every 3 months. 10)  See your eye doctor yearly to check for diabetic eye damage. 11)  Check your feet each night for sore areas, calluses or signs of infection. 12)  Check your Blood Pressure regularly. If it is above:130/80  you should make an appointment. Prescriptions: OMEPRAZOLE 20 MG CPDR (OMEPRAZOLE) Take 1 tablet by mouth once a day  #30 x 0   Entered and Authorized by:   Lars Mage MD   Signed by:   Lars Mage MD on 12/04/2009   Method used:   Electronically to        [facility]  [phone]* (retail)       [address]       [place], [place]  [address]       Ph: [phone] or [phone]       Fax: [phone]   RxID:   [id number] VOLTAREN 1 % GEL (DICLOFENAC SODIUM) apply to affected area 3-4 times a day.  #1 x 1   Entered and Authorized by:   Lars Mage MD   Signed by:   Lars Mage MD on 12/04/2009   Method used:   Electronically to        [facility]  [phone]* (retail)       [address]       [place], [place]  [address]       Ph: [phone] or [phone]       Fax: [phone]   RxID:   [id number] NOVOLIN 70/30 70-30 % SUSP (INSULIN ISOPHANE & REGULAR) inject 10 units at night  #85month suppl x 2   Entered and Authorized by:   Lars Mage MD   Signed by:   Lars Mage MD on 12/04/2009   Method used:   Electronically to        [facility]  [phone]* (retail)       [address]       [place], [place]  [address]       Ph: [phone] or [phone]       Fax: [phone]   RxID:   [id number]    Prevention & Chronic Care Immunizations   Influenza vaccine: Fluvax 3+  (07/24/2009)   Influenza vaccine due: 05/30/2011    Tetanus booster: 12/04/2009: Tdap   Tetanus booster due: 10/29/2017    Pneumococcal vaccine: Historical  (06/07/2006)   Pneumococcal vaccine due: 10/29/2010  Colorectal Screening   Hemoccult: Not  documented   Hemoccult action/deferral: Deferred  (12/04/2009)   Hemoccult due: 10/29/2010    Colonoscopy: Not documented   Colonoscopy action/deferral: Refused  (12/04/2009)  Other Screening   PSA: 1.09  (05/07/2009)   Smoking status: current  (12/04/2009)   Smoking cessation counseling: yes  (12/04/2009)  Diabetes Mellitus   HgbA1C: 10.0  (11/04/2009)    Eye exam: No diabetic retinopathy.     (06/26/2009)   Eye exam due: 06/2010  Foot exam: Not documented   High risk foot: Not documented   Foot care education: Not documented    Urine microalbumin/creatinine ratio: 7.6  (10/08/2008)    Diabetes flowsheet reviewed?: Yes   Progress toward A1C goal: Deteriorated  Lipids   Total Cholesterol: 206  (11/05/2009)   Lipid panel action/deferral: Deferred   LDL: 106  (11/05/2009)   LDL Direct: Not documented   HDL: 37  (11/05/2009)   Triglycerides: 316  (11/05/2009)    SGOT (AST): 34  (11/05/2009)   SGPT (ALT): 54  (11/05/2009)   Alkaline phosphatase: 106  (11/05/2009)   Total bilirubin: 0.5  (11/05/2009)    Lipid flowsheet reviewed?: Yes   Progress toward LDL goal: Unchanged  Hypertension   Last Blood Pressure: 124 / 70  (12/04/2009)   Serum creatinine: 0.87  (11/05/2009)   Serum potassium 4.5  (11/05/2009)    Hypertension flowsheet reviewed?: Yes   Progress toward BP goal: At goal  Self-Management Support :   Personal Goals (by the next clinic visit) :     Personal A1C goal: 7  (11/04/2009)     Personal blood pressure goal: 130/80  (11/04/2009)     Personal LDL goal: 100  (11/04/2009)    Patient will work on the following items until the next clinic visit to reach self-care goals:     Medications and monitoring: take my medicines every day  (12/04/2009)     Eating: drink diet soda or water instead of juice or soda, eat foods that are low in salt, eat baked foods instead of fried foods  (12/04/2009)    Diabetes self-management support: Copy of home glucose  meter record, CBG self-monitoring log, Written self-care plan, Education handout, Pre-printed educational material, Resources for patients handout  (12/04/2009)   Diabetes care plan printed   Diabetes education handout printed   Last diabetes self-management training by diabetes educator: 02/07/2009    Hypertension self-management support: Written self-care plan, Education handout, Pre-printed educational material, Resources for patients handout  (12/04/2009)   Hypertension self-care plan printed.   Hypertension education handout printed    Lipid self-management support: Pre-printed educational material, Resources for patients handout, Education handout  (12/04/2009)     Lipid education handout printed      Resource handout printed.   Nursing Instructions: Give tetanus booster today     Immunizations Administered:  Tetanus Vaccine:    Vaccine Type: Tdap    Site: left deltoid    Mfr: [employer]    Dose: 0.5 ml    Route: IM    Given by: Krystal Eaton (AAMA)    Exp. Date: 12/21/2011    Lot #: [id number]    VIS given: 08/16/07 version given December 04, 2009.

## 2010-10-28 NOTE — Assessment & Plan Note (Signed)
Summary: EST-CK/FU/MEDS/CFB   Vital Signs:  Patient profile:   55 year old male Height:      69 inches Weight:      213.3 pounds BMI:     31.61 Temp:     97.4 degrees F oral Pulse rate:   65 / minute BP sitting:   129 / 84  (right arm)  Vitals Entered By: Filomena Jungling NT II (June 26, 2010 11:22 AM) CC: CHECKUP Is Patient Diabetic? Yes Did you bring your meter with you today? Yes Pain Assessment Patient in pain? yes     Location: HIPS Intensity: 8 Type: aching Onset of pain  Chronic CBG Result 142 CBG Device ID [email or web address]  Does patient need assistance? Functional Status Self care Ambulation Normal   Diabetic Foot Exam Foot Inspection Is there a history of a foot ulcer?              No Is there a foot ulcer now?              No Can the patient see the bottom of their feet?          Yes Are the shoes appropriate in style and fit?          Yes Is there swelling or an abnormal foot shape?          No Are the toenails long?                No Are the toenails thick?                No Are the toenails ingrown?              No Is there heavy callous build-up?              No Is there pain in the calf muscle (Intermittent claudication) when walking?    NoIs there a claw toe deformity?              No Is there elevated skin temperature?            No Is there limited ankle dorsiflexion?            No Is there foot or ankle muscle weakness?            No  Diabetic Foot Care Education    10-g (5.07) Semmes-Weinstein Monofilament Test Performed by: Filomena Jungling NT II          Right Foot          Left Foot Site 1         normal         normal Site 2         normal         normal Site 3         normal         normal Site 4         normal         normal Site 5         normal         normal Site 6         normal         normal Site 7         normal         normal Site 8         normal         normal Site  9         normal         normal  Impression      normal          normal   Primary Care Provider:  Blondell Reveal MD  CC:  CHECKUP.  History of Present Illness: 56 yr old caucasian male with PMH  as mentioned in the EMR comes to the office for a regular office visit.  1. He reports that he is feeling "pretty good"  2. He reports that he is feeling "bugs under his skin" in the right legs for the last couple of months.He also  reports that he occasionally has some pain. He reports that he wanted to mention it but not really bothering.  3. He reports that he is still waiting on the medicaid approval.   4. He reports that he checks blood sugars "couple of times" every day and reports that his blood sugars pre-prandial sugars are ranging in 150's range most of the times.  He denies any other complaints.   Preventive Screening-Counseling & Management  Alcohol-Tobacco     Alcohol drinks/day: 0     Alcohol type: rarely     Smoking Status: current     Smoking Cessation Counseling: yes     Smoke Cessation Stage: relapse     Packs/Day: 1.0     Year Started: ABOUT THE AGE OF 7     Passive Smoke Exposure: no  Caffeine-Diet-Exercise     Caffeine use/day: coffee,tea     Caffeine Counseling: decrease use of caffeine     Does Patient Exercise: yes     Type of exercise: walking     Exercise (avg: min/session): <30     Times/week: 5  Problems Prior to Update: 1)  Skin Rash  (ICD-782.1) 2)  Foot Pain, Right  (ICD-729.5) 3)  Headache  (ICD-784.0) 4)  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) 5)  Urinary Frequency  (ICD-788.41) 6)  Preventive Health Care  (ICD-V70.0) 7)  Dyspnea On Exertion  (ICD-786.09) 8)  Angina, Atypical  (ICD-413.9) 9)  Abdominal Rigidity Right Lower Quadrant  (ICD-789.43) 10)  Disability Examination  (ICD-V68.01) 11)  Paresthesia, Hands  (ICD-782.0) 12)  Acute Bronchitis  (ICD-466.0) 13)  Preventive Health Care  (ICD-V70.0) 14)  Arthropathy Nos, 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 Take 1 Tablet By Mouth Once A Day 2)  Omeprazole 20 Mg Cpdr (Omeprazole) .... Take 1 Tablet By Mouth Once A Day 3)  Metformin Hcl 1000 Mg Tabs (Metformin Hcl) .Marland Kitchen.. 1 Tablet Twice Daily With Food 4)  Ascensia Contour Test   Strp (Glucose Blood) .... Take As Directed 5)  Adult Aspirin Low Strength 81 Mg  Tbdp (Aspirin) 6)  Zoloft 50 Mg  Tabs (Sertraline Hcl) .... Take 1 Tablet By Mouth Once A Day 7)  Nasonex 50 Mcg/act Susp (Mometasone Furoate) .... 2 Sprays Each Nostril Once A Day 8)  Lisinopril 20 Mg Tabs (Lisinopril) .... Take 1  Pill Once Daily. 9)  Pravachol 40 Mg Tabs (Pravastatin Sodium) .... Take 1 Tablet By Mouth Once A Day 10)  Ventolin Hfa 108 (90 Base) Mcg/act Aers (Albuterol Sulfate) .... Use 1-2 Puffs Every 4-6 Hours As Needed For Shortness of Breath 11)  Amlodipine Besylate 10 Mg Tabs (Amlodipine Besylate) .... Take 1 Tablet By Mouth Once A Day 12)  Terazosin Hcl 2 Mg Caps (Terazosin Hcl) .... Take 2 Capsule By Mouth Once A Day At Bedtime 13)  Glucotrol 10 Mg Tabs (Glipizide) .... Take 2 Tablets By Mouth in The Morning and 1 Tablet By Mouth in The Evening. 14)  Humulin N 100 Unit/ml Susp (Insulin Isophane Human) .... Please Dispense Relion Humulin N Insulin - Inject 12 Units At Night 15)  Glucerna  Liqd (Nutritional Supplements) .... One By Mouth Two Times A Day  Current Medications (verified): 1)  Atripla 600-200-300 Mg Tabs (Efavirenz-Emtricitab-Tenofovir) .... Take 1 Tablet By Mouth Once  A Day 2)  Omeprazole 20 Mg Cpdr (Omeprazole) .... Take 1 Tablet By Mouth Once A Day 3)  Metformin Hcl 1000 Mg Tabs (Metformin Hcl) .Marland Kitchen.. 1 Tablet Twice Daily With Food 4)  Ascensia Contour Test   Strp (Glucose Blood) .... Take As Directed 5)  Adult Aspirin Low Strength 81 Mg  Tbdp (Aspirin) 6)  Nasonex 50 Mcg/act Susp (Mometasone Furoate) .... 2 Sprays Each Nostril Once A Day 7)  Lisinopril 20 Mg Tabs (Lisinopril) .... Take 1 Pill Once Daily. 8)  Pravachol 40 Mg Tabs (Pravastatin Sodium) .... Take 1 Tablet By Mouth Once A Day 9)  Ventolin Hfa 108 (90 Base) Mcg/act Aers (Albuterol Sulfate) .... Use 1-2 Puffs Every 4-6 Hours As Needed For Shortness of Breath 10)  Amlodipine Besylate 10 Mg Tabs (Amlodipine Besylate) .... Take 1 Tablet By Mouth Once A Day 11)  Terazosin Hcl 2 Mg Caps (Terazosin Hcl) .... Take 2 Capsule By Mouth Once A Day At Bedtime 12)  Glucotrol 10 Mg Tabs (Glipizide) .... Take 2 Tablets By Mouth in The Morning and 1 Tablet By Mouth in The Evening. 13)  Humulin N 100 Unit/ml Susp (Insulin Isophane Human) .... Please Dispense Relion Humulin N Insulin - Inject 12 Units At Night 14)  Glucerna  Liqd (Nutritional Supplements) .... One By Mouth Two Times A Day  Allergies (verified): No Known Drug Allergies  Review of Systems      See HPI  Physical Exam  General:  alert, well-developed, and well-nourished.   Lungs:  normal respiratory effort, no intercostal retractions, no accessory muscle use, and normal breath sounds.   Heart:  normal rate, regular rhythm, no murmur, and no gallop.   Abdomen:  soft, non-tender, and normal bowel sounds.   Pulses:  R radial normal.   Extremities:  No pedal edema. Neurologic:  alert & oriented X3, cranial nerves II-XII intact, and strength normal in all extremities.    Diabetes Management Exam:    Foot Exam (with socks and/or shoes not present):       Sensory-Monofilament:          Left foot: normal          Right foot: normal   Impression  & Recommendations:  Problem # 1:  HYPERTENSION (ICD-401.9) Well controlled. Cont current regimen.  His updated medication list for this problem includes:    Lisinopril 20 Mg Tabs (Lisinopril) .Marland Kitchen... Take 1 pill once daily.    Amlodipine Besylate 10 Mg Tabs (Amlodipine besylate) .Marland Kitchen... Take 1 tablet by mouth once a day  Terazosin Hcl 2 Mg Caps (Terazosin hcl) .Marland Kitchen... Take 2 capsule by mouth once a day at bedtime  BP today: 129/84 Prior BP: 141/74 (04/09/2010)  Labs Reviewed: K+: 4.4 (02/10/2010) Creat: : 0.91 (02/10/2010)   Chol: 206 (11/05/2009)   HDL: 37 (11/05/2009)   LDL: 106 (11/05/2009)   TG: 316 (11/05/2009)  Problem # 2:  DIABETES MELLITUS, TYPE II (ICD-250.00) HbA1Cimproving. Discussed with Lupita Leash, will increase insulin to 13 units. Will follow up in 3 months.  His updated medication list for this problem includes:    Metformin Hcl 1000 Mg Tabs (Metformin hcl) .Marland Kitchen... 1 tablet twice daily with food    Adult Aspirin Low Strength 81 Mg Tbdp (Aspirin)    Lisinopril 20 Mg Tabs (Lisinopril) .Marland Kitchen... Take 1 pill once daily.    Glucotrol 10 Mg Tabs (Glipizide) .Marland Kitchen... Take 2 tablets by mouth in the morning and 1 tablet by mouth in the evening.    Humulin N 100 Unit/ml Susp (Insulin isophane human) .Marland Kitchen... Please dispense relion humulin n insulin - inject 13 units at night  Orders: T- Capillary Blood Glucose (01027)  Problem # 3:  HIV DISEASE (ICD-042) Has been following Dr. Philipp Deputy. Excellent control. Patient has fungal dermatitis of the left groin.  Problem # 4:  TOBACCO USER (ICD-305.1) Counselled him. Reports that he is goingg to quit in a few days.  Complete Medication List: 1)  Atripla 600-200-300 Mg Tabs (Efavirenz-emtricitab-tenofovir) .... Take 1 tablet by mouth once a day 2)  Omeprazole 20 Mg Cpdr (Omeprazole) .... Take 1 tablet by mouth once a day 3)  Metformin Hcl 1000 Mg Tabs (Metformin hcl) .Marland Kitchen.. 1 tablet twice daily with food 4)  Ascensia Contour Test Strp (Glucose blood)  .... Take as directed 5)  Adult Aspirin Low Strength 81 Mg Tbdp (Aspirin) 6)  Nasonex 50 Mcg/act Susp (Mometasone furoate) .... 2 sprays each nostril once a day 7)  Lisinopril 20 Mg Tabs (Lisinopril) .... Take 1 pill once daily. 8)  Pravachol 40 Mg Tabs (Pravastatin sodium) .... Take 1 tablet by mouth once a day 9)  Ventolin Hfa 108 (90 Base) Mcg/act Aers (Albuterol sulfate) .... Use 1-2 puffs every 4-6 hours as needed for shortness of breath 10)  Amlodipine Besylate 10 Mg Tabs (Amlodipine besylate) .... Take 1 tablet by mouth once a day 11)  Terazosin Hcl 2 Mg Caps (Terazosin hcl) .... Take 2 capsule by mouth once a day at bedtime 12)  Glucotrol 10 Mg Tabs (Glipizide) .... Take 2 tablets by mouth in the morning and 1 tablet by mouth in the evening. 13)  Humulin N 100 Unit/ml Susp (Insulin isophane human) .... Please dispense relion humulin n insulin - inject 13 units at night 14)  Glucerna Liqd (Nutritional supplements) .... One by mouth two times a day 15)  Clotrimazole-betamethasone 1-0.05 % Crea (Clotrimazole-betamethasone) .... Use twice daily over the affected area  Patient Instructions: 1)  Please schedule a follow-up appointment in 2 months. 2)  Use the medications as advised below. Prescriptions: OMEPRAZOLE 20 MG CPDR (OMEPRAZOLE) Take 1 tablet by mouth once a day  #30 x 11   Entered and Authorized by:   Blondell Reveal MD   Signed by:   Blondell Reveal MD on 06/26/2010   Method used:   Electronically to        [facility]  [phone]* (retail)       [address]       [place], [place]  [address]       Ph: [phone] or [phone]       Fax: [phone]   RxID:   [id number] METFORMIN HCL 1000 MG TABS (METFORMIN HCL) 1 tablet twice daily with food  #60 x 11   Entered and Authorized by:   Blondell Reveal MD   Signed by:   Blondell Reveal MD on 06/26/2010   Method used:   Electronically to        [facility]  [phone]* (retail)        [address]       [place], [place]  [address]       Ph: [phone] or [phone]       Fax: [phone]   RxID:   [id number] LISINOPRIL 20 MG TABS (LISINOPRIL) Take 1 pill once daily.  #30 Tablet x 11   Entered and Authorized by:   Blondell Reveal MD   Signed by:   Blondell Reveal MD on 06/26/2010   Method used:   Electronically to        [facility]  [phone]* (retail)       [address]       [place], [place]  [address]       Ph: [phone] or [phone]       Fax: [phone]   RxID:   [id number] AMLODIPINE BESYLATE 10 MG TABS (AMLODIPINE BESYLATE) Take 1 tablet by mouth once a day  #31 Tablet x 11   Entered and Authorized by:   Blondell Reveal MD   Signed by:   Blondell Reveal MD on 06/26/2010   Method used:   Electronically to        [facility]  [phone]* (retail)       [address]       [place], [place]  [address]       Ph: [phone] or [phone]       Fax: [phone]   RxID:   [id number] TERAZOSIN HCL 2 MG CAPS (TERAZOSIN HCL) Take 2 capsule by mouth once a day at bedtime  #60 x 5   Entered and Authorized by:   Blondell Reveal MD   Signed by:   Blondell Reveal MD on 06/26/2010   Method used:   Electronically to        [facility]  [phone]* (retail)       [address]       [place], [place]  [address]       Ph: [phone] or [phone]       Fax: [phone]   RxID:   [id number] CLOTRIMAZOLE-BETAMETHASONE 1-0.05 % CREA (CLOTRIMAZOLE-BETAMETHASONE) Use twice daily over the affected area  #1 x 3   Entered and Authorized by:   Blondell Reveal MD   Signed by:   Blondell Reveal MD on 06/26/2010   Method used:   Electronically to        [facility]  [phone]* (retail)       [address]       [place], [place]  [address]       Ph: [phone] or [phone]       Fax: [phone]    RxID:   [id number]    Prevention & Chronic Care Immunizations  Influenza vaccine: Fluvax 3+  (07/24/2009)   Influenza vaccine due: 05/30/2011    Tetanus booster: 12/04/2009: Tdap   Tetanus booster due: 10/29/2017    Pneumococcal vaccine: Historical  (06/07/2006)   Pneumococcal vaccine due: 10/29/2010  Colorectal Screening   Hemoccult: Not documented   Hemoccult action/deferral: Refused  (12/18/2009)   Hemoccult due: 10/29/2010    Colonoscopy: Not documented   Colonoscopy action/deferral: Deferred  (12/18/2009)  Other Screening   PSA: 1.09  (05/07/2009)   Smoking status: current  (06/26/2010)   Smoking cessation counseling: yes  (06/26/2010)  Diabetes Mellitus   HgbA1C: 7.5  (06/26/2010)   HgbA1C action/deferral: Ordered  (06/26/2010)    Eye exam: No diabetic retinopathy.     (06/26/2009)   Eye exam due: 06/2010    Foot exam: yes  (06/26/2010)   Foot exam action/deferral: Do today   High risk foot: Not documented   Foot care education: Not documented    Urine microalbumin/creatinine ratio: 7.6  (10/08/2008)  Lipids   Total Cholesterol: 206  (11/05/2009)   Lipid panel action/deferral: Deferred   LDL: 106  (11/05/2009)   LDL Direct: Not documented   HDL: 37  (11/05/2009)   Triglycerides: 316  (11/05/2009)    SGOT (AST): 34  (02/10/2010)   SGPT (ALT): 48  (02/10/2010)   Alkaline phosphatase: 90  (02/10/2010)   Total bilirubin: 0.7  (02/10/2010)  Hypertension   Last Blood Pressure: 129 / 84  (06/26/2010)   Serum creatinine: 0.91  (02/10/2010)   Serum potassium 4.4  (02/10/2010)  Self-Management Support :   Personal Goals (by the next clinic visit) :     Personal A1C goal: 7  (11/04/2009)     Personal blood pressure goal: 130/80  (11/04/2009)     Personal LDL goal: 100  (11/04/2009)    Patient will work on the following items until the next clinic visit to reach self-care goals:     Medications and monitoring: take my medicines every day, check  my blood sugar  (06/26/2010)     Eating: drink diet soda or water instead of juice or soda, eat more vegetables, use fresh or frozen vegetables, eat foods that are low in salt, eat fruit for snacks and desserts  (06/26/2010)    Diabetes self-management support: Education handout, Resources for patients handout  (06/26/2010)   Diabetes education handout printed   Last diabetes self-management training by diabetes educator: 02/07/2009    Hypertension self-management support: Education handout, Resources for patients handout  (06/26/2010)   Hypertension education handout printed    Lipid self-management support: Pre-printed educational material, Resources for patients handout  (06/26/2010)        Resource handout printed.   Nursing Instructions: HgbA1C today (see order) Diabetic foot exam today    Laboratory Results   Blood Tests   Date/Time Received: June 26, 2010 10:52 AM  Date/Time Reported: Burke Keels  June 26, 2010 10:52 AM   HGBA1C: 7.5%   (Normal Range: Non-Diabetic - 3-6%   Control Diabetic - 6-8%) CBG Random:: 142mg /dL     Appended Document: EST-CK/FU/MEDS/CFB   Influenza Vaccine    Vaccine Type: Fluvax Non-MCR    Site: right deltoid    Mfr: GlaxoSmithKline    Dose: 0.5 ml    Route: IM    Given by: Chinita Pester RN    Exp. Date: 03/28/2011    Lot #: [id number]    VIS given: 04/22/10 version given June 26, 2010.  Flu Vaccine Consent Questions  Do you have a history of severe allergic reactions to this vaccine? no    Any prior history of allergic reactions to egg and/or gelatin? no    Do you have a sensitivity to the preservative Thimersol? no    Do you have a past history of Guillan-Barre Syndrome? no    Do you currently have an acute febrile illness? no    Have you ever had a severe reaction to latex? no    Vaccine information given and explained to patient? yes   Appended Document: EST-CK/FU/MEDS/CFB     Allergies: No  Known Drug Allergies  Diabetes Management Exam:    Foot Exam (with socks and/or shoes not present):       Sensory-Monofilament:          Left foot: normal          Right foot: normal   Complete Medication List: 1)  Atripla 600-200-300 Mg Tabs (Efavirenz-emtricitab-tenofovir) .... Take 1 tablet by mouth once a day 2)  Omeprazole 20 Mg Cpdr (Omeprazole) .... Take 1 tablet by mouth once a day 3)  Metformin Hcl 1000 Mg Tabs (Metformin hcl) .Marland Kitchen.. 1 tablet twice daily with food 4)  Ascensia Contour Test Strp (Glucose blood) .... Take as directed 5)  Adult Aspirin Low Strength 81 Mg Tbdp (Aspirin) 6)  Nasonex 50 Mcg/act Susp (Mometasone furoate) .... 2 sprays each nostril once a day 7)  Lisinopril 20 Mg Tabs (Lisinopril) .... Take 1 pill once daily. 8)  Pravachol 40 Mg Tabs (Pravastatin sodium) .... Take 1 tablet by mouth once a day 9)  Ventolin Hfa 108 (90 Base) Mcg/act Aers (Albuterol sulfate) .... Use 1-2 puffs every 4-6 hours as needed for shortness of breath 10)  Amlodipine Besylate 10 Mg Tabs (Amlodipine besylate) .... Take 1 tablet by mouth once a day 11)  Terazosin Hcl 2 Mg Caps (Terazosin hcl) .... Take 2 capsule by mouth once a day at bedtime 12)  Glucotrol 10 Mg Tabs (Glipizide) .... Take 2 tablets by mouth in the morning and 1 tablet by mouth in the evening. 13)  Humulin N 100 Unit/ml Susp (Insulin isophane human) .... Please dispense relion humulin n insulin - inject 13 units at night 14)  Glucerna Liqd (Nutritional supplements) .... One by mouth two times a day 15)  Clotrimazole-betamethasone 1-0.05 % Crea (Clotrimazole-betamethasone) .... Use twice daily over the affected area  Prevention & Chronic Care Immunizations   Influenza vaccine: Fluvax Non-MCR  (06/26/2010)   Influenza vaccine due: 05/30/2011    Tetanus booster: 12/04/2009: Tdap   Tetanus booster due: 10/29/2017    Pneumococcal vaccine: Historical  (06/07/2006)   Pneumococcal vaccine due: 10/29/2010  Colorectal  Screening   Hemoccult: Not documented   Hemoccult action/deferral: Refused  (12/18/2009)   Hemoccult due: 10/29/2010    Colonoscopy: Not documented   Colonoscopy action/deferral: Deferred  (12/18/2009)  Other Screening   PSA: 1.09  (05/07/2009)   Smoking status: current  (06/26/2010)   Smoking cessation counseling: yes  (06/26/2010)  Diabetes Mellitus   HgbA1C: 7.5  (06/26/2010)   HgbA1C action/deferral: Ordered  (06/26/2010)    Eye exam: No diabetic retinopathy.     (06/26/2009)   Eye exam due: 06/2010    Foot exam: yes  (06/26/2010)   Foot exam action/deferral: Do today   High risk foot: Not documented   Foot care education: Not documented    Urine microalbumin/creatinine ratio: 7.6  (10/08/2008)  Lipids   Total Cholesterol: 206  (11/05/2009)   Lipid panel  action/deferral: Deferred   LDL: 106  (11/05/2009)   LDL Direct: Not documented   HDL: 37  (11/05/2009)   Triglycerides: 316  (11/05/2009)    SGOT (AST): 34  (02/10/2010)   SGPT (ALT): 48  (02/10/2010)   Alkaline phosphatase: 90  (02/10/2010)   Total bilirubin: 0.7  (02/10/2010)  Hypertension   Last Blood Pressure: 129 / 84  (06/26/2010)   Serum creatinine: 0.91  (02/10/2010)   Serum potassium 4.4  (02/10/2010)  Self-Management Support :   Personal Goals (by the next clinic visit) :     Personal A1C goal: 7  (11/04/2009)     Personal blood pressure goal: 130/80  (11/04/2009)     Personal LDL goal: 100  (11/04/2009)    Diabetes self-management support: Education handout, Resources for patients handout  (06/26/2010)   Last diabetes self-management training by diabetes educator: 06/26/2010    Hypertension self-management support: Education handout, Resources for patients handout  (06/26/2010)    Lipid self-management support: Pre-printed educational material, Resources for patients handout  (06/26/2010)    Nursing Instructions: Diabetic foot exam today    Diabetic Foot Exam Foot Inspection Is  there a history of a foot ulcer?              No Is there a foot ulcer now?              No Can the patient see the bottom of their feet?          Yes Are the shoes appropriate in style and fit?          Yes Is there swelling or an abnormal foot shape?          No Are the toenails long?                No Are the toenails thick?                No Are the toenails ingrown?              No Is there heavy callous build-up?              No Is there pain in the calf muscle (Intermittent claudication) when walking?    NoIs there a claw toe deformity?              No Is there elevated skin temperature?            No Is there limited ankle dorsiflexion?            No Is there foot or ankle muscle weakness?            No  Diabetic Foot Care Education    10-g (5.07) Semmes-Weinstein Monofilament Test Performed by: Filomena Jungling NT II          Right Foot          Left Foot Site 1         normal         normal Site 2         normal         normal Site 3         normal         normal Site 4         normal         normal Site 5         normal         normal Site  6         normal         normal Site 7         normal         normal Site 8         normal         normal Site 9         normal         normal  Impression      normal         normal

## 2010-10-28 NOTE — Miscellaneous (Signed)
Summary: copy of disability ketter  Clinical Lists Changes letter dated 03/21/09 shows patment od 1400 dollars per month to pt from his lont term disabilty.Golden Circle RN  July 14, 2010 7:52 AM

## 2010-10-28 NOTE — Assessment & Plan Note (Signed)
Summary: diabetes teaching/cfb   Vital Signs:  Patient profile:   55 year old male Weight:      213.3 pounds BMI:     31.61 Is Patient Diabetic? Yes Comments fasting   Allergies: No Known Drug Allergies   Complete Medication List: 1)  Atripla 600-200-300 Mg Tabs (Efavirenz-emtricitab-tenofovir) .... Take 1 tablet by mouth once a day 2)  Omeprazole 20 Mg Cpdr (Omeprazole) .... Take 1 tablet by mouth once a day 3)  Metformin Hcl 1000 Mg Tabs (Metformin hcl) .Marland Kitchen.. 1 tablet twice daily with food 4)  Ascensia Contour Test Strp (Glucose blood) .... Take as directed 5)  Adult Aspirin Low Strength 81 Mg Tbdp (Aspirin) 6)  Zoloft 50 Mg Tabs (Sertraline hcl) .... Take 1 tablet by mouth once a day 7)  Nasonex 50 Mcg/act Susp (Mometasone furoate) .... 2 sprays each nostril once a day 8)  Lisinopril 20 Mg Tabs (Lisinopril) .... Take 1 pill once daily. 9)  Pravachol 40 Mg Tabs (Pravastatin sodium) .... Take 1 tablet by mouth once a day 10)  Ventolin Hfa 108 (90 Base) Mcg/act Aers (Albuterol sulfate) .... Use 1-2 puffs every 4-6 hours as needed for shortness of breath 11)  Amlodipine Besylate 10 Mg Tabs (Amlodipine besylate) .... Take 1 tablet by mouth once a day 12)  Terazosin Hcl 2 Mg Caps (Terazosin hcl) .... Take 2 capsule by mouth once a day at bedtime 13)  Glucotrol 10 Mg Tabs (Glipizide) .... Take 2 tablets by mouth in the morning and 1 tablet by mouth in the evening. 14)  Humulin N 100 Unit/ml Susp (Insulin isophane human) .... Please dispense relion humulin n insulin - inject 12 units at night 15)  Glucerna Liqd (Nutritional supplements) .... One by mouth two times a day  Other Orders: T-Hgb A1C (in-house) (16109UE) DSMT(Medicare) Individual, 30 Minutes (G0108)  Diabetes Self Management Training  PCP: Blondell Reveal MD Date diagnosed with diabetes: 09/29/2003 Diabetes Type: Type 2 non-insulin treated Other persons present: no Current smoking Status: current  Vital Signs Todays  Weight: 213.3lb  in BMI 31.61in-lbs      Diabetes Medications:  Comments: Has been taking on 10 mg Glipizde twice a day, skipping the second Glipizide in the am because of cost? 150-200 fasting afternoons bwer than am- 150s, yesterday with a lot of activity it got down to 89. No signs or symptoms of low blood sugar. Glucose tablets in car. suggested they be carried onhim at all times Consider increasing insulin 1 unit to brign fastings under 130. Suggested also less caffiene and saturated and trans fats to help with insulin resistance. Patient has set a quit smoking date as next Monday- so told him that this should be priority. Also discussed storage of insulin as patient has not been discarding his vial after 1 month, but continues to use it until gone.  Mixed/Intermediate   Insulin Type:Humulin N  Bedtime Dose:12 units    Monitoring Self monitoring blood glucose 1 time a day Name of Meter  Freedom Lite  Other Assessment Had an amputation due to diabetes? No Ever had a foot ulcer or infection?           No Performs daily self-foot exams: Yes   Carrys Food for Low Blood sugar Yes Can you tell if your blood sugar is low? Yes   Estimated /Usual Carb Intake Breakfast # of Carbs/Grams coffee- 12 cups a day gone before noon Midmorning # of Carbs/Grams eggs & wheat bread, water or   ~11am  Lunch # of Carbs/Grams maybe a penautbutter sandwich- 1x a day, penutbutter mayo and cheese Dinner # of Carbs/Grams boxed mac and cheese, or low sodium soup & toasted cheese sandwich, fried liver & onioins Bedtime # of Carbs/Grams eats at least a dozen eggs a week. Fat: Inappropriate intake of food fats  Nutrition assessment Desired Weight: 200-205 ETOH : No How many meals per week do you eat away from home?   1-2 If you eat away from home , what type of restaurant do you choose?  chinese buffet Who does the food shopping? You Who does the cooking?  You Do you add salt to food? Yes What  beverages do you drink?  1 pot regular coffee per day, black tea, green tea and water Do you read food labels?                                                                          can but doesn't make a habit of it Biggest challenge to eating healthy: Imbalance Fats  Activity Limitations  Appropriate physical activity Would you  say you are physically active: Yes  Type of physical activity  Walk Length of time: # of times per week: 6 Diabetes Disease Process  Discussed today  Medications Demonstrates/verbalizes site selection and rotation for injections Demonstrates competency Nutritional Management State importance of spacing and not omitting meals and snacks: Needs review/assistance   State changes planned for home meals/snacks: Needs review/assistance    Monitoring State purpose and frequency of monitoring BG-ketones-HgbA1C  : Demonstrates competencyPerform glucose monitoring/ketone testing and record results correctly: Demonstrates competencyState target blood glucose and HgbA1C goals: Demonstrates competency Complications State the causes-signs and symptoms and prevention of Hyperglycemia: Needs review/assistanceExplain proper treatment of hyperglycemia: Needs review/assistanceState the causes- signs and symptoms and prevention of hypoglycemia: Needs review/assistance   State the principles of skin-dental and foot care: Demonstrates competencyDescribe symptoms of skin and foot problems and describe foot exam: Demonstrates competencyState when to seek medical advice and treatment: Demonstrates competency Exercise Verbalizes safety measures for exercise related to diabetes: Demonstrates competency   Diabetes Management Education Done: 06/26/2010        wants help with  losing weight, decreasing blood sugar.   Diabetes Self Management : counselor, friends and family Follow-up: 3 months    Laboratory Results   Blood Tests   Date/Time Received: June 26, 2010 10:53 AM  Date/Time Reported: Burke Keels  June 26, 2010 10:53 AM   HGBA1C: 7.5%   (Normal Range: Non-Diabetic - 3-6%   Control Diabetic - 6-8%)

## 2010-10-28 NOTE — Letter (Signed)
Summary: MeterDownload  MeterDownload   Imported By: Florinda Marker 12/04/2009 15:23:56  _____________________________________________________________________  External Attachment:    Type:   Image     Comment:   External Document

## 2010-10-28 NOTE — Letter (Signed)
Summary: DIABETIC LOG BOOK 08/31-09/28/2011  DIABETIC LOG BOOK 08/31-09/28/2011   Imported By: Shon Hough 06/30/2010 16:18:21  _____________________________________________________________________  External Attachment:    Type:   Image     Comment:   External Document

## 2010-10-28 NOTE — Letter (Signed)
Summary: [facility]: Physical Capacities Evaluations  [facility]: Physical Capacities Evaluations   Imported By: Florinda Marker 10/09/2009 16:18:13  _____________________________________________________________________  External Attachment:    Type:   Image     Comment:   External Document

## 2010-10-30 NOTE — Miscellaneous (Signed)
Summary: [facility] Financial Update  Clinical Lists Changes  Observations: Added new observation of PCTFPL: 155.12  (09/08/2010 12:40) Added new observation of FINASSESSDT: 09/08/2010  (09/08/2010 12:40) Added new observation of YEARLYEXPEN: 977  (09/08/2010 12:40)

## 2010-10-30 NOTE — Letter (Signed)
Summary: TWO WEEK GLUCOSE  TWO WEEK GLUCOSE   Imported By: Margie Billet 10/03/2010 14:54:18  _____________________________________________________________________  External Attachment:    Type:   Image     Comment:   External Document

## 2010-10-30 NOTE — Miscellaneous (Signed)
Summary: [facility]   [facility]   Imported By: Florinda Marker 10/06/2010 16:06:57  _____________________________________________________________________  External Attachment:    Type:   Image     Comment:   External Document

## 2010-10-30 NOTE — Assessment & Plan Note (Signed)
Summary: FOOT/SB.   Vital Signs:  Patient profile:   55 year old male Height:      69 inches (175.26 cm) Weight:      214.3 pounds (97.41 kg) BMI:     31.76 Temp:     97.4 degrees F (36.33 degrees C) oral Pulse rate:   69 / minute BP sitting:   127 / 79  (left arm) Cuff size:   large  Vitals Entered By: Cynda Familia Duncan Dull) (September 29, 2010 10:06 AM) CC: left foot pain-not hurting today Is Patient Diabetic? Yes Did you bring your meter with you today? Yes Pain Assessment Patient in pain? no      Nutritional Status BMI of > 30 = obese CBG Result 217  Have you ever been in a relationship where you felt threatened, hurt or afraid?No   Does patient need assistance? Functional Status Self care Ambulation Normal   Primary Care Provider:  Blondell Reveal MD  CC:  left foot pain-not hurting today.  History of Present Illness: 55 yr old man with pmhx as described below comes to the clinic complaining of left foot pain. Symptoms have now resolved but patient reports that pain was located plantar aspect of left foot most pain was felt early morning. Denies trauma. Aggrevated by walking bare foot. Alleviated since using comfortable shoes.   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) 5)  Urinary Frequency  (ICD-788.41) 6)  Preventive Health Care  (ICD-V70.0) 7)  Dyspnea On Exertion  (ICD-786.09) 8)  Angina, Atypical  (ICD-413.9) 9)  Abdominal Rigidity Right Lower Quadrant  (ICD-789.43) 10)  Disability Examination  (ICD-V68.01) 11)  Paresthesia, Hands   (ICD-782.0) 12)  Acute Bronchitis  (ICD-466.0) 13)  Preventive Health Care  (ICD-V70.0) 14)  Arthropathy Nos, Shoulder  (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 Take 1 Tablet By Mouth Once A Day 2)  Omeprazole 20 Mg Cpdr (Omeprazole) .... Take 1 Tablet By Mouth Once A Day 3)  Metformin Hcl 1000 Mg Tabs (Metformin Hcl) .Marland Kitchen.. 1 Tablet Twice Daily With Food 4)  Ascensia Contour Test   Strp (Glucose Blood) .... Take As Directed 5)  Nasonex 50 Mcg/act Susp (Mometasone Furoate) .... 2 Sprays Each Nostril Once A Day 6)  Lisinopril 20 Mg Tabs (Lisinopril) .... Take 1 Pill Once Daily. 7)  Pravachol 40 Mg Tabs (Pravastatin Sodium) .... Take 1 Tablet By Mouth Once A Day 8)  Ventolin Hfa 108 (90  Base) Mcg/act Aers (Albuterol Sulfate) .... Use 1-2 Puffs Every 4-6 Hours As Needed For Shortness of Breath 9)  Amlodipine Besylate 10 Mg Tabs (Amlodipine Besylate) .... Take 1 Tablet By Mouth Once A Day 10)  Terazosin Hcl 2 Mg Caps (Terazosin Hcl) .... Take 2 Capsule By Mouth Once A Day At Bedtime 11)  Glucotrol 10 Mg Tabs (Glipizide) .... Take 2 Tablets By Mouth in The Morning and 1 Tablet By Mouth in The Evening. 12)  Humulin N 100 Unit/ml Susp (Insulin Isophane Human) .... Please Dispense Relion Humulin N Insulin - Inject 13 Units At Night 13)  Glucerna  Liqd (Nutritional Supplements) .... One By Mouth Two Times A Day 14)  Clotrimazole-Betamethasone 1-0.05 % Crea  (Clotrimazole-Betamethasone) .... Use Twice Daily Over The Affected Area  Current Medications (verified): 1)  Atripla 600-200-300 Mg Tabs (Efavirenz-Emtricitab-Tenofovir) .... Take 1 Tablet By Mouth Once A Day 2)  Omeprazole 20 Mg Cpdr (Omeprazole) .... Take 1 Tablet By Mouth Once A Day 3)  Metformin Hcl 1000 Mg Tabs (Metformin Hcl) .Marland Kitchen.. 1 Tablet Twice Daily With Food 4)  Ascensia Contour Test   Strp (Glucose Blood) .... Take As Directed 5)  Nasonex 50 Mcg/act Susp (Mometasone Furoate) .... 2 Sprays Each Nostril Once A Day 6)  Lisinopril 20 Mg Tabs (Lisinopril) .... Take 1 Pill Once Daily. 7)  Pravachol 40 Mg Tabs (Pravastatin Sodium) .... Take 1 Tablet By Mouth Once A Day 8)  Ventolin Hfa 108 (90 Base) Mcg/act Aers (Albuterol Sulfate) .... Use 1-2 Puffs Every 4-6 Hours As Needed For Shortness of Breath 9)  Amlodipine Besylate 10 Mg Tabs (Amlodipine Besylate) .... Take 1 Tablet By Mouth Once A Day 10)  Terazosin Hcl 2 Mg Caps (Terazosin Hcl) .... Take 2 Capsule By Mouth Once A Day At Bedtime 11)  Glucotrol 10 Mg Tabs (Glipizide) .... Take 2 Tablets By Mouth in The Morning and 1 Tablet By Mouth in The Evening. 12)  Humulin N 100 Unit/ml Susp (Insulin Isophane Human) .... Please Dispense Relion Humulin N Insulin - Inject 13 Units At Night 13)  Glucerna  Liqd (Nutritional Supplements) .... One By Mouth Two Times A Day 14)  Clotrimazole-Betamethasone 1-0.05 % Crea (Clotrimazole-Betamethasone) .... Use Twice Daily Over The Affected Area  Allergies: No Known Drug Allergies  Past History:  Past Medical History: Last updated: 12/12/2007 Depression stable off meds Diabetes mellitus, type II - well controlled Hypertension - well controlled off meds HIV disease - dxed 1995 after shingles followed by ID Dr. Sampson Goon Hyperlipidemia Rheumatoid arthritis:  followed by Dr. Kellie Simmering, off mtx and prednisione since 2007  Family History: Last updated: 07/19/2007 [place]  Social History: Last updated:  10/22/2008 Occupation: retired [employer] architect Current Smoker cut down to >1/2 pack per day. Alcohol use-yes occassional Drug use-no Regular exercise-yes walks (2-3 times per week) Not sexually active  -  On disability - approved  Risk Factors: Alcohol Use: 0 (09/29/2010) Caffeine Use: coffee,tea (09/08/2010) Exercise: yes (09/08/2010)  Risk Factors: Smoking Status: quit < 6 months (09/29/2010) Packs/Day: 1.0 (09/29/2010) Passive Smoke Exposure: no (09/29/2010)  Social History: Reviewed history from 10/22/2008 and no changes required. Occupation: retired [employer] architect Current Smoker cut down to >1/2 pack per day. Alcohol use-yes occassional Drug use-no Regular exercise-yes walks (2-3 times per week) Not sexually active  -  On disability - approved  Review of Systems  The patient denies fever, chest pain, syncope, dyspnea on exertion, peripheral edema, headaches, hemoptysis, abdominal pain, melena, hematochezia, severe indigestion/heartburn, hematuria, incontinence, muscle weakness,  difficulty walking, depression, and unusual weight change.    Physical Exam  General:  alert, well-developed, and well-nourished.   Mouth:  Oral mucosa and oropharynx without lesions or exudates.  Neck:  No deformities, masses, or tenderness noted. Lungs:  normal respiratory effort, no intercostal retractions, no accessory muscle use, and normal breath sounds.   Heart:  normal rate, regular rhythm, no murmur, and no gallop.   Abdomen:  soft, non-tender, and normal bowel sounds.   Msk:  normal ROM, no joint tenderness, no joint swelling, no joint warmth, no redness over joints, no joint deformities, and no joint instability.   Extremities:  No pedal edema. Neurologic:  alert & oriented X3, cranial nerves II-XII intact, and strength normal in all extremities.     Impression & Recommendations:  Problem # 1:  FOOT PAIN, LEFT (ICD-729.5) Symptoms resolved. Symptoms suggestive of Plantar Fasciitis.  Educated patient strenghting exercises, and the usage of shoes with good supportive even when at home.  Problem # 2:  HYPERTENSION (ICD-401.9) At goal.  His updated medication list for this problem includes:    Lisinopril 20 Mg Tabs (Lisinopril) .Marland Kitchen... Take 1 pill once daily.    Amlodipine Besylate 10 Mg Tabs (Amlodipine besylate) .Marland Kitchen... Take 1 tablet by mouth once a day    Terazosin Hcl 2 Mg Caps (Terazosin hcl) .Marland Kitchen... Take 2 capsule by mouth once a day at bedtime  BP today: 127/79 Prior BP: 151/76 (09/08/2010)  Labs Reviewed: K+: 4.6 (08/25/2010) Creat: : 0.89 (08/25/2010)   Chol: 206 (11/05/2009)   HDL: 37 (11/05/2009)   LDL: 106 (11/05/2009)   TG: 316 (11/05/2009)  Problem # 3:  TOBACCO USER (ICD-305.1) Encouraged smoking cessation.  Problem # 4:  DIABETES MELLITUS, TYPE II (ICD-250.00) Continue current regimen. Instructed to bring meter on follow up.  His updated medication list for this problem includes:    Metformin Hcl 1000 Mg Tabs (Metformin hcl) .Marland Kitchen... 1 tablet twice daily with food    Lisinopril 20 Mg Tabs (Lisinopril) .Marland Kitchen... Take 1 pill once daily.    Glucotrol 10 Mg Tabs (Glipizide) .Marland Kitchen... Take 2 tablets by mouth in the morning and 1 tablet by mouth in the evening.    Humulin N 100 Unit/ml Susp (Insulin isophane human) .Marland Kitchen... Please dispense relion humulin n insulin - inject 13 units at night  Orders: T- Capillary Blood Glucose (46962) T-Hgb A1C (in-house) (95284XL)  Labs Reviewed: Creat: 0.89 (08/25/2010)     Last Eye Exam: No diabetic retinopathy.    (06/26/2009) Reviewed HgBA1c results: 7.7 (09/29/2010)  7.5 (06/26/2010)  Complete Medication List: 1)  Atripla 600-200-300 Mg Tabs (Efavirenz-emtricitab-tenofovir) .... Take 1 tablet by mouth once a day 2)  Omeprazole 20 Mg Cpdr (Omeprazole) .... Take 1 tablet by mouth once a day 3)  Metformin Hcl 1000 Mg Tabs (Metformin hcl) .Marland Kitchen.. 1 tablet twice daily with food 4)  Ascensia Contour Test Strp (Glucose blood) .... Take  as directed 5)  Nasonex 50 Mcg/act Susp (Mometasone furoate) .... 2 sprays each nostril once a day 6)  Lisinopril 20 Mg Tabs (Lisinopril) .... Take 1 pill once daily. 7)  Pravachol 40 Mg Tabs (Pravastatin sodium) .... Take 1 tablet by mouth once a day 8)  Ventolin Hfa 108 (90 Base) Mcg/act Aers (Albuterol sulfate) .... Use 1-2 puffs every 4-6 hours as needed for shortness of breath 9)  Amlodipine Besylate 10 Mg Tabs (Amlodipine besylate) .... Take 1 tablet by mouth once a day 10)  Terazosin Hcl 2 Mg Caps (Terazosin hcl) .Marland KitchenMarland KitchenMarland Kitchen  Take 2 capsule by mouth once a day at bedtime 11)  Glucotrol 10 Mg Tabs (Glipizide) .... Take 2 tablets by mouth in the morning and 1 tablet by mouth in the evening. 12)  Humulin N 100 Unit/ml Susp (Insulin isophane human) .... Please dispense relion humulin n insulin - inject 13 units at night 13)  Glucerna Liqd (Nutritional supplements) .... One by mouth two times a day 14)  Clotrimazole-betamethasone 1-0.05 % Crea (Clotrimazole-betamethasone) .... Use twice daily over the affected area  Patient Instructions: 1)  Please return to your follow-up appointment in February. 2)  Take all medications as directed.   Orders Added: 1)  T- Capillary Blood Glucose [82948] 2)  T-Hgb A1C (in-house) [83036QW] 3)  Est. Patient Level III [16109]     Prevention & Chronic Care Immunizations   Influenza vaccine: Fluvax Non-MCR  (06/26/2010)   Influenza vaccine due: 05/30/2011    Tetanus booster: 12/04/2009: Tdap   Tetanus booster due: 10/29/2017    Pneumococcal vaccine: Historical  (06/07/2006)   Pneumococcal vaccine due: 10/29/2010  Colorectal Screening   Hemoccult: Not documented   Hemoccult action/deferral: Refused  (12/18/2009)   Hemoccult due: 10/29/2010    Colonoscopy: Not documented   Colonoscopy action/deferral: Deferred  (12/18/2009)  Other Screening   PSA: 1.09  (05/07/2009)   Smoking status: quit < 6 months  (09/29/2010)  Diabetes Mellitus   HgbA1C:  7.7  (09/29/2010)   HgbA1C action/deferral: Ordered  (06/26/2010)    Eye exam: No diabetic retinopathy.     (06/26/2009)   Eye exam due: 06/2010    Foot exam: yes  (06/26/2010)   Foot exam action/deferral: Do today   High risk foot: Not documented   Foot care education: Not documented    Urine microalbumin/creatinine ratio: 7.6  (10/08/2008)    Diabetes flowsheet reviewed?: Yes   Progress toward A1C goal: Deteriorated  Lipids   Total Cholesterol: 206  (11/05/2009)   Lipid panel action/deferral: Deferred   LDL: 106  (11/05/2009)   LDL Direct: Not documented   HDL: 37  (11/05/2009)   Triglycerides: 316  (11/05/2009)    SGOT (AST): 26  (08/25/2010)   SGPT (ALT): 40  (08/25/2010)   Alkaline phosphatase: 100  (08/25/2010)   Total bilirubin: 0.4  (08/25/2010)    Lipid flowsheet reviewed?: Yes   Progress toward LDL goal: Unchanged  Hypertension   Last Blood Pressure: 127 / 79  (09/29/2010)   Serum creatinine: 0.89  (08/25/2010)   Serum potassium 4.6  (08/25/2010)    Hypertension flowsheet reviewed?: Yes   Progress toward BP goal: At goal  Self-Management Support :   Personal Goals (by the next clinic visit) :     Personal A1C goal: 7  (11/04/2009)     Personal blood pressure goal: 130/80  (11/04/2009)     Personal LDL goal: 100  (11/04/2009)    Patient will work on the following items until the next clinic visit to reach self-care goals:     Medications and monitoring: check my blood sugar, bring all of my medications to every visit  (09/29/2010)     Eating: eat more vegetables, eat foods that are low in salt, eat baked foods instead of fried foods, eat fruit for snacks and desserts  (09/29/2010)    Diabetes self-management support: Written self-care plan  (09/29/2010)   Diabetes care plan printed   Last diabetes self-management training by diabetes educator: 06/26/2010    Hypertension self-management support: Written self-care plan  (09/29/2010)   Hypertension  self-care plan printed.    Lipid self-management support: Written self-care plan  (09/29/2010)   Lipid self-care plan printed.   Laboratory Results   Blood Tests   Date/Time Received: September 29, 2010 10:37 AM Date/Time Reported: Alric Quan  September 29, 2010 10:37 AM   HGBA1C: 7.7%   (Normal Range: Non-Diabetic - 3-6%   Control Diabetic - 6-8%) CBG Random:: 217mg /dL       Patient Instructions: 1)  Please return to your follow-up appointment in February. 2)  Take all medications as directed.

## 2010-10-30 NOTE — Assessment & Plan Note (Signed)
Summary: 2WK F/U/VS   Primary Provider:  Blondell Reveal MD  CC:  follow-up visit, lab results, c/o left foot pain, and requests Insulin refill.  History of Present Illness: patient complains of pain on the side of his left foot.  He denies known injury.  No ulcerations. no swelling or erythema. He has been taking his HIV medications every day. patient is probably fact that he quit smoking 15 days ago.  He did this on his own without any medication or any patches.  Preventive Screening-Counseling & Management  Alcohol-Tobacco     Alcohol drinks/day: 0     Alcohol type: rarely     Smoking Status: quit < 6 months     Smoking Cessation Counseling: yes     Smoke Cessation Stage: relapse     Packs/Day: 1.0     Year Started: ABOUT THE AGE OF 7     Passive Smoke Exposure: no  Caffeine-Diet-Exercise     Caffeine use/day: coffee,tea     Caffeine Counseling: decrease use of caffeine     Does Patient Exercise: yes     Type of exercise: walking     Exercise (avg: min/session): <30     Times/week: 5  Hep-HIV-STD-Contraception     HIV Risk: no  Safety-Violence-Falls     Seat Belt Use: yes     Seat Belt Counseling: not applicable     Firearms in the Home: firearms in the home     Smoke Detectors: yes      Drug Use:  never.        Blood Transfusions:  no.        Travel History:  no.    Comments: pt. declined condoms   Updated Prior Medication List: ATRIPLA 600-200-300 MG TABS (EFAVIRENZ-EMTRICITAB-TENOFOVIR) Take 1 tablet by mouth once a day OMEPRAZOLE 20 MG CPDR (OMEPRAZOLE) Take 1 tablet by mouth once a day METFORMIN HCL 1000 MG TABS (METFORMIN HCL) 1 tablet twice daily with food ASCENSIA CONTOUR TEST   STRP (GLUCOSE BLOOD) Take as directed NASONEX 50 MCG/ACT SUSP (MOMETASONE FUROATE) 2 sprays each nostril once a day LISINOPRIL 20 MG TABS (LISINOPRIL) Take 1 pill once daily. PRAVACHOL 40 MG TABS (PRAVASTATIN SODIUM) Take 1 tablet by mouth once a day VENTOLIN HFA 108 (90 BASE)  MCG/ACT AERS (ALBUTEROL SULFATE) Use 1-2 puffs every 4-6 hours as needed for shortness of breath AMLODIPINE BESYLATE 10 MG TABS (AMLODIPINE BESYLATE) Take 1 tablet by mouth once a day TERAZOSIN HCL 2 MG CAPS (TERAZOSIN HCL) Take 2 capsule by mouth once a day at bedtime GLUCOTROL 10 MG TABS (GLIPIZIDE) Take 2 tablets by mouth in the morning and 1 tablet by mouth in the evening. HUMULIN N 100 UNIT/ML SUSP (INSULIN ISOPHANE HUMAN) please dispense ReliOn Humulin N insulin - inject 13 units at night GLUCERNA  LIQD (NUTRITIONAL SUPPLEMENTS) one by mouth two times a day CLOTRIMAZOLE-BETAMETHASONE 1-0.05 % CREA (CLOTRIMAZOLE-BETAMETHASONE) Use twice daily over the affected area  Current Allergies (reviewed today): No known allergies  Past History:  Past Medical History: Last updated: 12/12/2007 Depression stable off meds Diabetes mellitus, type II - well controlled Hypertension - well controlled off meds HIV disease - dxed 1995 after shingles followed by ID Dr. Sampson Goon Hyperlipidemia Rheumatoid arthritis:  followed by Dr. Kellie Simmering, off mtx and prednisione since 2007  Review of Systems  The patient denies anorexia, fever, and weight loss.    Vital Signs:  Patient profile:   55 year old male Height:      54  inches (175.26 cm) Weight:      214.4 pounds (97.45 kg) BMI:     31.78 Temp:     98.3 degrees F (36.83 degrees C) oral Pulse rate:   57 / minute BP sitting:   151 / 76  (right arm)  Vitals Entered By: Wendall Mola CMA Duncan Dull) (September 08, 2010 9:48 AM) CC: follow-up visit, lab results, c/o left foot pain, requests Insulin refill Is Patient Diabetic? Yes Did you bring your meter with you today? Yes Pain Assessment Patient in pain? yes     Location: left foot Intensity: 3 Type: aching Onset of pain  Constant Nutritional Status BMI of > 30 = obese Nutritional Status Detail appetite "up and down"  Have you ever been in a relationship where you felt threatened, hurt  or afraid?No   Does patient need assistance? Functional Status Self care Ambulation Normal Comments no missed doses of Atripla per pt.   Physical Exam  General:  alert, well-developed, well-nourished, and well-hydrated.   Head:  normocephalic and atraumatic.   Mouth:  pharynx pink and moist.   Lungs:  normal breath sounds.     Impression & Recommendations:  Problem # 1:  HIV DISEASE (ICD-042) Pt.s most recent CD4ct was  and VL ,20 .  Pt instructed to continue the current antiretroviral regimen.  Pt encouraged to take medication regularly and not miss doses.  Pt will f/u840 in 3 months for repeat blood work and will see me 2 weeks later.  Diagnostics Reviewed:  HIV: CDC-defined AIDS (10/23/1994)   CD4: 840 (08/26/2010)   WBC: 8.3 (08/25/2010)   PMN (bands): 0 (11/26/2008)   Hgb: 15.3 (08/25/2010)   HCT: 44.6 (08/25/2010)   Platelets: 205 (08/25/2010) HIV-1 RNA: <20 copies/mL (08/25/2010)   HBSAg: NO (11/22/2006)  Problem # 2:  FOOT PAIN, RIGHT (ICD-729.5) if persists will need referal to Podiatry  Problem # 3:  DIABETES MELLITUS, TYPE II (ICD-250.00) f/u with PCP His updated medication list for this problem includes:    Metformin Hcl 1000 Mg Tabs (Metformin hcl) .Marland Kitchen... 1 tablet twice daily with food    Lisinopril 20 Mg Tabs (Lisinopril) .Marland Kitchen... Take 1 pill once daily.    Glucotrol 10 Mg Tabs (Glipizide) .Marland Kitchen... Take 2 tablets by mouth in the morning and 1 tablet by mouth in the evening.    Humulin N 100 Unit/ml Susp (Insulin isophane human) .Marland Kitchen... Please dispense relion humulin n insulin - inject 13 units at night  Problem # 4:  TOBACCO USER (ICD-305.1) pt has stopped smoking for last 15 days  Other Orders: Est. Patient Level III (44818) Future Orders: T-CD4SP ([facility]) (CD4SP) ... 12/07/2010 T-HIV Viral Load [phone]) ... 12/07/2010 T-Comprehensive Metabolic Panel [phone]) ... 12/07/2010 T-CBC w/Diff ([id number]) ... 12/07/2010  Patient Instructions: 1)  Please  schedule a follow-up appointment in 3 months, 2 weeks after labs.

## 2010-10-30 NOTE — Progress Notes (Signed)
Summary: refill/ hla  Phone Note Refill Request Message from:  Patient on October 21, 2010 12:24 PM  Refills Requested: Medication #1:  GLUCERNA  LIQD one by mouth two times a day   Dosage confirmed as above?Dosage Confirmed Initial call taken by: Marin Roberts RN,  October 21, 2010 12:24 PM  Follow-up for Phone Call       Follow-up by: Blondell Reveal MD,  October 22, 2010 7:08 AM    Prescriptions: Rico Sheehan (NUTRITIONAL SUPPLEMENTS) one by mouth two times a day  #60 x 11   Entered and Authorized by:   Blondell Reveal MD   Signed by:   Blondell Reveal MD on 10/22/2010   Method used:   Electronically to        [facility] (retail)       [address]       [place], [place]  [id number]       Ph: [phone]       Fax: [phone]   RxID:   [id number]

## 2010-10-30 NOTE — Miscellaneous (Signed)
Summary: ALLSUP  ALLSUP   Imported By: Margie Billet 09/17/2010 11:29:59  _____________________________________________________________________  External Attachment:    Type:   Image     Comment:   External Document

## 2010-11-10 ENCOUNTER — Encounter (INDEPENDENT_AMBULATORY_CARE_PROVIDER_SITE_OTHER): Payer: Self-pay | Admitting: *Deleted

## 2010-11-11 ENCOUNTER — Ambulatory Visit: Payer: Self-pay | Admitting: Internal Medicine

## 2010-11-12 ENCOUNTER — Encounter (INDEPENDENT_AMBULATORY_CARE_PROVIDER_SITE_OTHER): Payer: Self-pay | Admitting: *Deleted

## 2010-11-19 NOTE — Miscellaneous (Signed)
  Clinical Lists Changes  Observations: Added new observation of AIDSDAP: Pending 2012 (11/12/2010 15:55) Added new observation of INCOMESOURCE: DISABILITY UNEMPLOYED (11/12/2010 15:55) Added new observation of HOUSEINCOME: 7400  (11/12/2010 15:55)

## 2010-11-19 NOTE — Miscellaneous (Signed)
Summary: ADAP app sent - need approval for 2012  Clinical Lists Changes  Observations: Added new observation of AIDSDAP: Pending-approval for 2012 (11/10/2010 9:22)

## 2010-11-20 ENCOUNTER — Ambulatory Visit: Payer: Self-pay | Admitting: Internal Medicine

## 2010-11-21 ENCOUNTER — Encounter (INDEPENDENT_AMBULATORY_CARE_PROVIDER_SITE_OTHER): Payer: Self-pay | Admitting: *Deleted

## 2010-11-25 NOTE — Miscellaneous (Signed)
Summary: ADAP approved til 06/28/11  Clinical Lists Changes  Observations: Added new observation of PCTFPL: 68.33  (11/21/2010 17:37) Added new observation of YEARLYEXPEN: 0  (11/21/2010 17:37) Added new observation of FINASSESSDT: 11/05/2010  (11/21/2010 17:37) Added new observation of AIDSDAP: Yes 2012  (11/21/2010 17:37)

## 2010-11-26 ENCOUNTER — Encounter: Payer: Self-pay | Admitting: Internal Medicine

## 2010-11-26 ENCOUNTER — Ambulatory Visit (INDEPENDENT_AMBULATORY_CARE_PROVIDER_SITE_OTHER): Payer: Self-pay | Admitting: Internal Medicine

## 2010-11-26 DIAGNOSIS — F3289 Other specified depressive episodes: Secondary | ICD-10-CM

## 2010-11-26 DIAGNOSIS — I10 Essential (primary) hypertension: Secondary | ICD-10-CM

## 2010-11-26 DIAGNOSIS — E785 Hyperlipidemia, unspecified: Secondary | ICD-10-CM

## 2010-11-26 DIAGNOSIS — F17200 Nicotine dependence, unspecified, uncomplicated: Secondary | ICD-10-CM

## 2010-11-26 DIAGNOSIS — F329 Major depressive disorder, single episode, unspecified: Secondary | ICD-10-CM

## 2010-11-26 DIAGNOSIS — E119 Type 2 diabetes mellitus without complications: Secondary | ICD-10-CM

## 2010-11-26 MED ORDER — TERAZOSIN HCL 2 MG PO CAPS: 4.0000 mg | ORAL_CAPSULE | Freq: Every day | ORAL | Status: DC

## 2010-11-26 MED ORDER — CITALOPRAM HYDROBROMIDE 40 MG PO TABS: 40.0000 mg | ORAL_TABLET | Freq: Every day | ORAL | Status: DC

## 2010-11-26 NOTE — Patient Instructions (Signed)
CALL us OR COME TO CLINIC RIGHT AWAY IF YOUR DEPRESSION SYMPTOMS GET WORSE AFTER YOU START THE MEDICINE CITALOPRAM  COME BACK TO SEE ME-DR TALBOT-IN 10 DAYS  REMEMBER THAT THE MEDICINES OFTEN TAKE TIME-SOMETIMES 4 TO 6 WEEKS-TO TAKE EFFECT SO DO NOT BE DISCOURAGED IF NO EFFECTS RIGHT AWAY

## 2010-11-26 NOTE — Progress Notes (Signed)
  Subjective:    Patient ID: Riley Jones, male    DOB: 1956-03-12, 55 y.o.   MRN: [id number]  HPI Here for primary symptom of depession. Tearful, having frequent "better off dead" thoughts without suicidal plans or thoughts. Has significant situational stress, moderate social isolation-does have a few friends "they have as many problems as I do". On specific questioning about suicadal thoughts he says he would not pursue that because of family, and he is very attached to his dog, who "keeps me going" No intercurrent alcohol use or other substances. Smokes 1/2 to 1 ppd.  No guns in the home  Terarful during interview.   HIV care reviewed, and has been stable.  Review of Systems Diffuse, poorly localized jt pains, poor sleep, fair to good appetite Not inivolved in a relationship at this point.    Objective:   Physical Exam  Constitutional: He appears distressed.  Psychiatric:       Sad, tearful affect. Mood not severely constricted           Assessment & Plan:  Depression, Major, not on medications  Plan Citalopram: 20 mg qam for 2 weeks then 40 mg qam. Counseled extensively on potential side effects, including flare in sx's and risk suicidal ideation  He contracts with me to come into clinic or ER immediately for suicidal ideation or significant worsening in mood.  FU in 10 dayds to re-evaluate.

## 2010-12-08 ENCOUNTER — Ambulatory Visit (INDEPENDENT_AMBULATORY_CARE_PROVIDER_SITE_OTHER): Payer: Self-pay | Admitting: Internal Medicine

## 2010-12-08 ENCOUNTER — Encounter: Payer: Self-pay | Admitting: Ophthalmology

## 2010-12-08 ENCOUNTER — Encounter: Payer: Self-pay | Admitting: Internal Medicine

## 2010-12-08 VITALS — BP 147/80 | HR 55 | Temp 97.0°F | Wt 211.9 lb

## 2010-12-08 DIAGNOSIS — F339 Major depressive disorder, recurrent, unspecified: Secondary | ICD-10-CM

## 2010-12-08 LAB — GLUCOSE, CAPILLARY: Glucose-Capillary: 217 mg/dL — ABNORMAL HIGH (ref 70–99)

## 2010-12-08 NOTE — Progress Notes (Signed)
  Subjective:    Patient ID: Riley Jones, male    DOB: [birth year], 55 y.o.   MRN: [id number]  HPI Here on follow up for depression after starting citalopram, 20mg  with a prompt increase to 40 mg a day.  No flare in his mood, and he notices he is not tearful anymoer. Sleep is unchanged, but not worse. Mild HA's and loose stool after dose increase to 40mg , but he is tolerating well.    Review of Systems     Objective:   Physical Exam    Smiling a bit more, looks less depressed, brighter affect.    Assessment & Plan:   Major Depression, recurrent.  Improving on medication-no suicidal ideation.  Advised again on time course of medications, effects and potential side effects including flare in mood. RTC promtly or call immediately for any worsening of depression.

## 2010-12-08 NOTE — Assessment & Plan Note (Signed)
Her

## 2010-12-10 LAB — T-HELPER CELL (CD4) - (RCID CLINIC ONLY)
CD4 % Helper T Cell: 42 % (ref 33–55)
CD4 T Cell Abs: 840 uL (ref 400–2700)

## 2010-12-15 LAB — T-HELPER CELL (CD4) - (RCID CLINIC ONLY): CD4 % Helper T Cell: 39 % (ref 33–55)

## 2010-12-19 LAB — GLUCOSE, CAPILLARY: Glucose-Capillary: 243 mg/dL — ABNORMAL HIGH (ref 70–99)

## 2010-12-29 ENCOUNTER — Encounter: Payer: Self-pay | Admitting: Internal Medicine

## 2010-12-29 ENCOUNTER — Ambulatory Visit (INDEPENDENT_AMBULATORY_CARE_PROVIDER_SITE_OTHER): Payer: Self-pay | Admitting: Internal Medicine

## 2010-12-29 VITALS — BP 129/73 | HR 83 | Temp 98.1°F | Wt 211.5 lb

## 2010-12-29 DIAGNOSIS — E785 Hyperlipidemia, unspecified: Secondary | ICD-10-CM

## 2010-12-29 DIAGNOSIS — B20 Human immunodeficiency virus [HIV] disease: Secondary | ICD-10-CM

## 2010-12-29 DIAGNOSIS — Z1211 Encounter for screening for malignant neoplasm of colon: Secondary | ICD-10-CM

## 2010-12-29 DIAGNOSIS — F329 Major depressive disorder, single episode, unspecified: Secondary | ICD-10-CM

## 2010-12-29 DIAGNOSIS — E119 Type 2 diabetes mellitus without complications: Secondary | ICD-10-CM

## 2010-12-29 DIAGNOSIS — F3289 Other specified depressive episodes: Secondary | ICD-10-CM

## 2010-12-29 DIAGNOSIS — I10 Essential (primary) hypertension: Secondary | ICD-10-CM

## 2010-12-29 DIAGNOSIS — F17200 Nicotine dependence, unspecified, uncomplicated: Secondary | ICD-10-CM

## 2010-12-29 LAB — GLUCOSE, CAPILLARY: Glucose-Capillary: 234 mg/dL — ABNORMAL HIGH (ref 70–99)

## 2010-12-29 MED ORDER — CITALOPRAM HYDROBROMIDE 40 MG PO TABS: 40.0000 mg | ORAL_TABLET | Freq: Every evening | ORAL | Status: DC

## 2010-12-29 MED ORDER — METFORMIN HCL 1000 MG PO TABS: 1000.0000 mg | ORAL_TABLET | Freq: Two times a day (BID) | ORAL | Status: DC

## 2010-12-29 NOTE — Assessment & Plan Note (Signed)
Patient's blood pressure is well controlled. I would not check make any changes to his meds today I would check patient's CMET.

## 2010-12-29 NOTE — Assessment & Plan Note (Signed)
Patient is due for lipid profile patient will be coming back on Wednesday to get his blood work done.

## 2010-12-29 NOTE — Assessment & Plan Note (Signed)
Patient wants to quit cold [place]. Discussed in detail about the benefits of smoking cessation patient is not agreeable to see a counselor at this point of time I provided the patient with the quitline number the face that he would consider giving them a call.

## 2010-12-29 NOTE — Assessment & Plan Note (Addendum)
Patient's HbA1c is 7.7 today which is about what he had 3 months ago. No changes to medications today. Patient does say that he hasn't been compliant with his insulin. The benefits and side effects of noncompliance were discussed and he agreed to be more complaint in future.

## 2010-12-29 NOTE — Assessment & Plan Note (Signed)
His HIV is well controlled with viral load undetectable patient is currently taking atripla. He used to see Dr. Philipp Deputy and is waiting for a new HIV doctor [facility].

## 2010-12-29 NOTE — Patient Instructions (Signed)
Diabetes and Exercise Regular exercise is important and can help:   Control blood glucose (sugar).   Decrease blood pressure.   Control blood lipids (cholesterol and triglycerides).   Improve overall health.  BENEFITS FROM EXERCISE:  Improved fitness.   Improved flexibility.   Improved endurance.   Increased bone density.   Weight control.   Increased muscle strength.   Decreased body fat.   Improvement of the body's use of a hormone called insulin.   Increased insulin sensitivity.   Reduction of insulin needs.   Helps you feel better.   Reduces stress and tension.  People with diabetes who add exercise to their lifestyle gain additional benefits.   Weight loss.   Reduces appetite.   Improves body's use of blood glucose (sugar).   Decreases risk factors for heart disease:   Lowering of cholesterol and triglycerides.   Raising the level of good cholesterol (high-density lipoproteins [HDL]).   Lowering blood sugar.   Decreases blood pressure.  TYPE 1 DIABETES AND EXERCISE  Exercise will usually lower your blood glucose.   If blood glucose is greater than 240 mg/dl, check urine ketones. If ketones are present, do not exercise.   Location of the insulin injection sites may need to be adjusted with exercise. Avoid injecting insulin into areas of the body that will be exercised. For example, avoid injecting insulin into:   The arms when playing tennis.   The legs when jogging. For more information, discuss this with your caregiver.   Keep a record of:   Food intake.   Type and amount of exercise.   Expected peak times of insulin action.   Blood glucose (sugar) levels.  Do this before, during and after exercise. Review your records with your caregiver(s). This will help you to develop guidelines for adjusting food intake and/or insulin amounts.  TYPE 2 DIABETES AND EXERCISE  Regular physical activity can help control blood glucose.   Exercise is  important because it may:   Increase the body's sensitivity to insulin.   Improve blood glucose control.   Exercise reduces the risk of heart disease. It decreases serum cholesterol and triglycerides. It also lowers blood pressure.   Those who take insulin or oral hypoglycemic agents should watch for signs of hypoglycemia. These signs include dizziness, shaking, sweating, chills and confusion.   Body water is lost during exercise. It must be replaced. This will help to avoid loss of body fluids (dehydration) and/or heat stroke.  Be sure to talk to your caregiver before starting an exercise program to make sure it is safe for you. Remember, any activity is better than none.  Document Released: 12/05/2003 Document Re-Released: 07/12/2009 [employer] Patient Information 2011 [employer]. 

## 2010-12-29 NOTE — Progress Notes (Signed)
  Subjective:    Patient ID: Riley Jones, male    DOB: 17-Aug-1956, 55 y.o.   MRN: [id number]  HPI is recovering 55 year old man hospital history of HIV diabetes type 2 and other medical problems as mentioned in EPIC.   Patient was seen by a Dr. Reche Dixon for depression and was started on Celexa. He mentions that his mood is much improved now but he cut back on the dose to 20 mg because he thought that 40 mg was just too much for him and he was making him feel more sleepy.  Patient's HIV is well controlled with a viral load undetectable at this time.  The patient is currently smoking about 1 pack a day and is not interested in quitting.  Patient's blood pressure is well-controlled today at 129/73.  Patient has dyspnea on exertion and has mild COPD understands that this is most likely secondary to his smoking. Patient states that he's not using his albuterol inhaler at all.  No other complaints at this.      Review of Systems  Constitutional: Negative for fever, activity change and appetite change.  HENT: Negative for sore throat.   Respiratory: Negative for cough and shortness of breath.   Cardiovascular: Negative for chest pain and leg swelling.  Gastrointestinal: Negative for nausea, abdominal pain, diarrhea, constipation and abdominal distention.  Genitourinary: Negative for frequency, hematuria and difficulty urinating.  Neurological: Negative for dizziness and headaches.  Psychiatric/Behavioral: Negative for suicidal ideas and behavioral problems.       Objective:   Physical Exam  Constitutional: He is oriented to person, place, and time. He appears well-developed and well-nourished.  HENT:  Head: Normocephalic and atraumatic.  Eyes: Conjunctivae and EOM are normal. Pupils are equal, round, and reactive to light. No scleral icterus.  Neck: Normal range of motion. Neck supple. No JVD present. No thyromegaly present.  Cardiovascular: Normal rate, regular rhythm, normal heart  sounds and intact distal pulses.  Exam reveals no gallop and no friction rub.   No murmur heard. Pulmonary/Chest: Effort normal and breath sounds normal. No respiratory distress. He has no wheezes. He has no rales.  Abdominal: Soft. Bowel sounds are normal. He exhibits no distension and no mass. There is no tenderness. There is no rebound and no guarding.  Musculoskeletal: Normal range of motion. He exhibits no edema and no tenderness.  Lymphadenopathy:    He has no cervical adenopathy.  Neurological: He is alert and oriented to person, place, and time.  Psychiatric: He has a normal mood and affect. His behavior is normal.          Assessment & Plan:  Please refer to problem oriented assessment plan

## 2010-12-29 NOTE — Assessment & Plan Note (Addendum)
Patient says that his mood is much better now. He cut back on his medication by himself as he thought that he was feeling "dead from inside". He still complaints of inability to sleep at night. The patient mentioned that he woke up at 4:30 this morning. I advised him to take his medication at nighttime and also keep the dose at 40 since that is the more effective dose. I will have him come back in 3-4 months or set up an appointment if he feels a need to discuss anything in particular.

## 2010-12-30 LAB — MICROALBUMIN / CREATININE URINE RATIO: Microalb, Ur: 0.5 mg/dL (ref 0.00–1.89)

## 2010-12-31 ENCOUNTER — Other Ambulatory Visit: Payer: Self-pay

## 2010-12-31 DIAGNOSIS — E119 Type 2 diabetes mellitus without complications: Secondary | ICD-10-CM

## 2010-12-31 DIAGNOSIS — F3289 Other specified depressive episodes: Secondary | ICD-10-CM

## 2010-12-31 DIAGNOSIS — F329 Major depressive disorder, single episode, unspecified: Secondary | ICD-10-CM

## 2010-12-31 LAB — COMPREHENSIVE METABOLIC PANEL
ALT: 55 U/L — ABNORMAL HIGH (ref 0–53)
AST: 31 U/L (ref 0–37)
Albumin: 4.3 g/dL (ref 3.5–5.2)
Alkaline Phosphatase: 93 U/L (ref 39–117)
Glucose, Bld: 155 mg/dL — ABNORMAL HIGH (ref 70–99)
Potassium: 4.4 mEq/L (ref 3.5–5.3)
Sodium: 136 mEq/L (ref 135–145)
Total Protein: 6.6 g/dL (ref 6.0–8.3)

## 2010-12-31 LAB — CBC WITH DIFFERENTIAL/PLATELET
Basophils Absolute: 0 10*3/uL (ref 0.0–0.1)
Basophils Relative: 1 % (ref 0–1)
Hemoglobin: 15.1 g/dL (ref 13.0–17.0)
MCHC: 35.1 g/dL (ref 30.0–36.0)
Monocytes Relative: 8 % (ref 3–12)
Neutro Abs: 4.7 10*3/uL (ref 1.7–7.7)
Neutrophils Relative %: 60 % (ref 43–77)
RBC: 4.6 MIL/uL (ref 4.22–5.81)

## 2010-12-31 LAB — LIPID PANEL
LDL Cholesterol: 90 mg/dL (ref 0–99)
Triglycerides: 230 mg/dL — ABNORMAL HIGH (ref <150)
VLDL: 46 mg/dL — ABNORMAL HIGH (ref 0–40)

## 2011-01-01 ENCOUNTER — Telehealth: Payer: Self-pay | Admitting: *Deleted

## 2011-01-01 ENCOUNTER — Encounter: Payer: Self-pay | Admitting: Internal Medicine

## 2011-01-01 ENCOUNTER — Ambulatory Visit (INDEPENDENT_AMBULATORY_CARE_PROVIDER_SITE_OTHER): Payer: Medicare Other | Admitting: Internal Medicine

## 2011-01-01 DIAGNOSIS — K047 Periapical abscess without sinus: Secondary | ICD-10-CM | POA: Insufficient documentation

## 2011-01-01 LAB — POC HEMOCCULT BLD/STL (HOME/3-CARD/SCREEN)
Card #2 Fecal Occult Blod, POC: NEGATIVE
Card #3 Fecal Occult Blood, POC: NEGATIVE
Fecal Occult Blood, POC: NEGATIVE

## 2011-01-01 MED ORDER — AMOXICILLIN 500 MG PO CAPS: 500.0000 mg | ORAL_CAPSULE | Freq: Two times a day (BID) | ORAL | Status: AC

## 2011-01-01 MED ORDER — MELOXICAM 15 MG PO TABS: 15.0000 mg | ORAL_TABLET | Freq: Every day | ORAL | Status: DC

## 2011-01-01 NOTE — Telephone Encounter (Signed)
Pt called stating he had a root canal last year. The tooth beside that tooth, bottom of jaw. Onset for 2 days, today painful and face swollen.  He can't afford to return to [facility]. Hx DM, HIV  Will see today.

## 2011-01-01 NOTE — Progress Notes (Signed)
  Subjective:    Patient ID: Riley Jones, male    DOB: 1956/02/18, 55 y.o.   MRN: [id number]  HPI Riley Jones is a 55 year old man who came in today for a bad tooth ache. Patient does not have a dentist anymore as he lost his insurance. Patient states that he had a filling on his right lower tooth come out the other day and he's been having pain in his right lower tooth ever since. The pain is 10 out of 10, sharp and he's unable to eat food because of the pain. Patient also has associated swelling in his right side of her lower face. Denies fever chills nausea or vomiting. No other complaints at this time. Patient has been using warm saline gargles and prescription strength ibuprofen for pain relief. The above medications does not seem to help.    Review of Systems  Constitutional: Negative for fever, activity change and appetite change.  HENT: Positive for facial swelling and dental problem. Negative for sore throat.   Respiratory: Negative for cough and shortness of breath.   Cardiovascular: Negative for chest pain and leg swelling.  Gastrointestinal: Negative for nausea, abdominal pain, diarrhea, constipation and abdominal distention.  Genitourinary: Negative for frequency, hematuria and difficulty urinating.  Neurological: Negative for dizziness and headaches.  Psychiatric/Behavioral: Negative for suicidal ideas and behavioral problems.       Objective:   Physical Exam  Constitutional: He is oriented to person, place, and time. He appears well-developed and well-nourished.  HENT:  Head: Normocephalic and atraumatic.  Mouth/Throat: Abnormal dentition. Dental abscesses and dental caries present.    Eyes: Conjunctivae and EOM are normal. Pupils are equal, round, and reactive to light. No scleral icterus.  Neck: Normal range of motion. Neck supple. No JVD present. No thyromegaly present.  Cardiovascular: Normal rate, regular rhythm, normal heart sounds and intact distal pulses.  Exam  reveals no gallop and no friction rub.   No murmur heard. Pulmonary/Chest: Effort normal and breath sounds normal. No respiratory distress. He has no wheezes. He has no rales.  Abdominal: Soft. Bowel sounds are normal. He exhibits no distension and no mass. There is no tenderness. There is no rebound and no guarding.  Musculoskeletal: Normal range of motion. He exhibits no edema and no tenderness.  Lymphadenopathy:    He has no cervical adenopathy.  Neurological: He is alert and oriented to person, place, and time.  Psychiatric: He has a normal mood and affect. His behavior is normal.          Assessment & Plan:

## 2011-01-01 NOTE — Patient Instructions (Signed)
Abscessed Tooth An abscessed tooth is an infection around your tooth. It may be caused by holes or damage to the tooth (cavity) or a dental disease. An abscessed tooth causes mild to very bad pain in and around the tooth. See your dentist right away for care if you or your child has tooth or gum pain.  HOME CARE  Take all medicine as told by your doctor or dentist.   Do not drive after taking pain medicine.   Rinse the mouth (gargle) often with salt water (1/4 teaspoon salt in 8 ounces of warm water).   Do not apply heat to the outside of the face.  GET HELP RIGHT AWAY IF:  You or your child has a temperature by mouth above 101F, not controlled by medicine.   Chills and a very bad headache develop.   You or your child has problems breathing or swallowing.   The mouth will not open.   Puffiness (swelling) into the neck or around the eye develops.   There is pain that is not helped by medicine.   The pain is getting worse instead of better.  MAKE SURE YOU:   Understand these instructions.   Will watch this condition.   Will get help right away if you or your child is not doing well or gets worse.  Document Released: 03/02/2008 Document Re-Released: 12/09/2009 [facility] Patient Information 2011 [place], [place].   Make an appointment as necessary. Call clinic if your pain is not adequately controlled.

## 2011-01-06 ENCOUNTER — Other Ambulatory Visit (INDEPENDENT_AMBULATORY_CARE_PROVIDER_SITE_OTHER): Payer: Medicare Other

## 2011-01-06 DIAGNOSIS — B20 Human immunodeficiency virus [HIV] disease: Secondary | ICD-10-CM

## 2011-01-06 LAB — COMPLETE METABOLIC PANEL WITH GFR
ALT: 55 U/L — ABNORMAL HIGH (ref 0–53)
AST: 40 U/L — ABNORMAL HIGH (ref 0–37)
CO2: 23 mEq/L (ref 19–32)
Calcium: 9.9 mg/dL (ref 8.4–10.5)
Chloride: 102 mEq/L (ref 96–112)
GFR, Est African American: >60 mL/min (ref >60)
Potassium: 4.5 mEq/L (ref 3.5–5.3)
Sodium: 136 mEq/L (ref 135–145)
Total Protein: 6.9 g/dL (ref 6.0–8.3)

## 2011-01-06 LAB — CBC WITH DIFFERENTIAL/PLATELET
Basophils Relative: 1 % (ref 0–1)
Eosinophils Absolute: 0.2 10*3/uL (ref 0.0–0.7)
Eosinophils Relative: 2 % (ref 0–5)
MCH: 32.7 pg (ref 26.0–34.0)
MCHC: 34.9 g/dL (ref 30.0–36.0)
MCV: 93.6 fL (ref 78.0–100.0)
Monocytes Relative: 8 % (ref 3–12)
Neutrophils Relative %: 66 % (ref 43–77)
Platelets: 205 10*3/uL (ref 150–400)

## 2011-01-07 LAB — HIV-1 RNA QUANT-NO REFLEX-BLD: HIV-1 RNA Quant, Log: <1.3 {Log} (ref <1.30)

## 2011-01-07 LAB — T-HELPER CELL (CD4) - (RCID CLINIC ONLY): CD4 % Helper T Cell: 39 % (ref 33–55)

## 2011-01-08 LAB — GLUCOSE, CAPILLARY: Glucose-Capillary: 178 mg/dL — ABNORMAL HIGH (ref 70–99)

## 2011-01-12 LAB — GLUCOSE, CAPILLARY
Glucose-Capillary: 188 mg/dL — ABNORMAL HIGH (ref 70–99)
Glucose-Capillary: 220 mg/dL — ABNORMAL HIGH (ref 70–99)

## 2011-01-22 ENCOUNTER — Ambulatory Visit: Payer: Self-pay | Admitting: Adult Health

## 2011-01-30 ENCOUNTER — Other Ambulatory Visit (INDEPENDENT_AMBULATORY_CARE_PROVIDER_SITE_OTHER): Payer: Medicare Other | Admitting: *Deleted

## 2011-01-30 DIAGNOSIS — E785 Hyperlipidemia, unspecified: Secondary | ICD-10-CM

## 2011-01-30 MED ORDER — PRAVASTATIN SODIUM 40 MG PO TABS: 40.0000 mg | ORAL_TABLET | Freq: Every day | ORAL | Status: DC

## 2011-02-03 ENCOUNTER — Encounter: Payer: Self-pay | Admitting: Adult Health

## 2011-02-03 ENCOUNTER — Ambulatory Visit (INDEPENDENT_AMBULATORY_CARE_PROVIDER_SITE_OTHER): Payer: Medicare Other | Admitting: Adult Health

## 2011-02-03 ENCOUNTER — Other Ambulatory Visit (INDEPENDENT_AMBULATORY_CARE_PROVIDER_SITE_OTHER): Payer: Medicare Other | Admitting: Internal Medicine

## 2011-02-03 DIAGNOSIS — E119 Type 2 diabetes mellitus without complications: Secondary | ICD-10-CM

## 2011-02-03 DIAGNOSIS — B20 Human immunodeficiency virus [HIV] disease: Secondary | ICD-10-CM

## 2011-02-03 DIAGNOSIS — M069 Rheumatoid arthritis, unspecified: Secondary | ICD-10-CM

## 2011-02-03 NOTE — Progress Notes (Signed)
Subjective:    Patient ID: Riley Jones, male    DOB: 1955-11-14, 55 y.o.   MRN: [id number]  HPI presents to clinic for followup. He remains adherent to his Atripla with good tolerance. Still having some issues with glycemic control. States his hemoglobin A1c was 8.8, which is 1.1%, higher than last measurement according to his records. He is followed by internal medicine for this and does not currently have a followup appointment with them. Additionally, his RA is flaring up, and he has an appointment with his rheumatologist within the next few days. At present, he is not certain what therapy.they will give him, but he has been on methotrexate and steroids in the past.     Review of Systems  Constitutional: Positive for fatigue. Negative for fever, chills, diaphoresis, activity change, appetite change and unexpected weight change.  HENT: Negative.   Eyes: Positive for redness and itching. Negative for photophobia, pain, discharge and visual disturbance.  Respiratory: Negative for apnea, cough, choking, chest tightness, shortness of breath, wheezing and stridor.   Cardiovascular: Negative for chest pain, palpitations and leg swelling.  Gastrointestinal: Negative.   Genitourinary: Negative.   Musculoskeletal: Positive for back pain, joint swelling, arthralgias and gait problem.  Skin: Negative.   Neurological: Negative.   Hematological: Negative.   Psychiatric/Behavioral: Negative.        Objective:   Physical Exam  Constitutional: He is oriented to person, place, and time. He appears well-developed.       Obese  HENT:  Head: Normocephalic and atraumatic.  Mouth/Throat: Oropharynx is clear and moist.  Eyes: Conjunctivae and EOM are normal. Pupils are equal, round, and reactive to light.  Neck: Normal range of motion. Neck supple. No tracheal deviation present.  Cardiovascular: Normal rate, regular rhythm and normal heart sounds.   Pulmonary/Chest: Effort normal and breath sounds  normal.  Abdominal: Soft. Bowel sounds are normal.  Musculoskeletal: He exhibits tenderness.       Digits of both hands, with phalangeal joint swelling, point tenderness and limited flexion of the digits on both hands. No erythema to the digits noted, nor noticeable nodules.  Lymphadenopathy:    He has cervical adenopathy.  Neurological: He is alert and oriented to person, place, and time. No cranial nerve deficit. He exhibits normal muscle tone. Coordination normal.  Skin: Skin is warm and dry.  Psychiatric: He has a normal mood and affect. His behavior is normal. Judgment and thought content normal.          Assessment & Plan:   1. HIV. Labs from 01/06/2011 show a CD4 count of 740 at 39% with an HIV viral load of less than 20 copies per mL. Continues to demonstrate clinical stability on current regimen. Recommend continuing present management with followup in 4 months and repeat labs 2 weeks before his next appointment. We should consider doing a bone density scan considering his history of being on tenofovir, as well as his history of RA, previous therapy with steroids and methotrexate, and the possibility that these therapies may be resumed with his followup with a rheumatologist.  2. RA. To see the rheumatologist in the next few days. He is to contact the clinic to inform us of what therapy. The rheumatologist might order for him. He verbally acknowledged this information and agreed with plan of care.  3. Diabetes Type 2. Glycemic control is not improved. Would recommend he contact, internal medicine for further evaluation and possibly modification of his current therapy. A referral from internal  medicine to diabetic education. Might also be helpful for him. He verbally acknowledged this and also agreed to contact internal medicine.

## 2011-02-04 ENCOUNTER — Other Ambulatory Visit (INDEPENDENT_AMBULATORY_CARE_PROVIDER_SITE_OTHER): Payer: Medicare Other | Admitting: *Deleted

## 2011-02-04 DIAGNOSIS — E119 Type 2 diabetes mellitus without complications: Secondary | ICD-10-CM

## 2011-02-05 MED ORDER — INSULIN NPH (HUMAN) (ISOPHANE) 100 UNIT/ML ~~LOC~~ SUSP: 13.0000 [IU] | Freq: Every day | SUBCUTANEOUS | Status: DC

## 2011-02-13 NOTE — Op Note (Signed)
Riley Jones, Riley Jones NO.:  [id number]   MEDICAL RECORD NO.:  [id number]          PATIENT TYPE:  AMB   LOCATION:  DSC                          FACILITY:  [facility]   PHYSICIAN:  Katy Fitch. Sypher, M.D. DATE OF BIRTH:  May 19, 1956   DATE OF PROCEDURE:  11/24/2005  DATE OF DISCHARGE:                                 OPERATIVE REPORT   PREOP DIAGNOSIS:  1.  Severe left carpal tunnel syndrome.  2.  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  3.  Chronic stenosing tenosynovitis of left small finger due to      tenosynovitis and A1 pulley contracture.   POSTOP DIAGNOSIS:  1.  Severe left carpal tunnel syndrome.  2.  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  3.  Chronic stenosing tenosynovitis of left small finger due to      tenosynovitis and A1 pulley contracture.   OPERATION:  1.  Release of left transverse carpal ligament.  2.  Through separate incision, release of left index A1 pulley.  3.  Release of left small finger A1 pulley with synovectomy of superficialis      and profundus tendons four for crystal examination and histopathology to      rule out granulomatous tenosynovitis.   OPERATING SURGEON:  Molly Maduro Sypher   ASSISTANT:  Molly Maduro Dasnoit PA-C.   ANESTHESIA:  General by LMA.  Supervising anesthesiologist is Dr. Gelene Mink.   INDICATIONS:  Riley Jones is a 55 year old gentleman referred by Dr.  Burnice Logan for evaluation and management of hand numbness and stenosing  tenosynovitis symptoms.   Riley Jones has multiple medical problems including type 2 diabetes, gout,  hypertension and HIV disease.   His CD-4 counts have been acceptable.   He has no history of infection in his hand but has developed profound  numbness and triggering of multiple fingers. At times he has had triggering  of all of his left fingers, but  the present time he has locking of his index  and small fingers.   His initial examination in our office one month prior revealed triggering of  the index, long and ring fingers. The long and ring fingers have improved in  the interim from the past exam.   During our interview in the preop holding area, Riley Jones requested that we  release his left index finger A1 pulley and his left small finger A1 pulley  as they are causing him maximum discomfort at this time.   After informed consent he is brought to the operating room for the  aforementioned procedures.   PROCEDURE:  Riley Jones was brought to the operating room and placed in  supine position on the operating table.   Following the induction of general anesthesia by LMA technique. The left arm  was prepped with Betadine soap solution, sterilely draped.   A pneumatic tourniquet was applied proximal brachium.   Following exsanguination of the left arm with  an Esmarch bandage, the  arterial tourniquet was inflated on the proximal brachium to 250 mmHg.   The procedure commenced with a short incision in the line of the ring finger  in the proximal palm. The subcutaneous tissues were carefully divided  revealing the palmar fascia. This was split with scissors to reveal the  common sensory branches of the median nerve.   These were followed back to the transverse carpal ligament which was then  gently isolated from the median nerve and flexor tendons using a Child psychotherapist.   The ligament was released of scissors extending into the distal forearm.   This widely opened carpal canal.   A very edematous tenosynovium was noted without obvious gouty crystals.  There was erythema and definite hyperemia.   The wound was then repaired with intradermal 3-0 Prolene suture.   Attention was then directed to the index finger A1 pulley region.   A palpable thickening at the A1 pulley and proximal tenosynovial fold was   noted.   An oblique incision was fashioned followed by gentle dissection, releasing  the palmar fascia. Blunt retractors were placed and the A1 pulley was found  be very edematous, grayish pink in color and extremely swollen.   The tenosynovial swelling was gently dissected with a Penfield 4 elevator  followed by release of the A1 pulley with a scalpel and scissors. The flexor  tendons were delivered and found be invested with a very edematous  tenosynovium.   Free range of motion of the index finger was recovered.   Attention then directed to the small finger where a separate oblique  incision was fashioned exposing a tenosynovial mass obliterating the normal  anatomy of the pulley.   This was cleaned with a rongeur and sample sent for crystal examination in  absolute alcohol and synovium sent for histopathologic evaluation looking  for granulomas change.   There was abundant tenosynovium on the superficialis profundus tendons of  the small finger that was sent part in absolute alcohol and part in  formalin.   The wound was then inspected for bleeding points and repaired with mattress  sutures of 5-0 nylon.   There were no apparent complications.   Riley Jones tolerated surgery and anesthesia well. His wounds were dressed  with Xeroflo, sterile gauze and a volar plaster splint maintaining the wrist  in 5 degrees of dorsiflexion.   There no apparent complications.      Katy Fitch Sypher, M.D.  Electronically Signed     RVS/MEDQ  D:  11/24/2005  T:  11/24/2005  Job:  [id number]   cc:   Rockey Situ. Flavia Shipper., M.D.  Fax: [phone]

## 2011-02-18 ENCOUNTER — Other Ambulatory Visit (INDEPENDENT_AMBULATORY_CARE_PROVIDER_SITE_OTHER): Payer: Medicare Other | Admitting: *Deleted

## 2011-02-18 DIAGNOSIS — E119 Type 2 diabetes mellitus without complications: Secondary | ICD-10-CM

## 2011-02-18 MED ORDER — GLUCOSE BLOOD VI STRP: ORAL_STRIP | Status: DC

## 2011-02-20 ENCOUNTER — Other Ambulatory Visit (INDEPENDENT_AMBULATORY_CARE_PROVIDER_SITE_OTHER): Payer: Medicare Other | Admitting: *Deleted

## 2011-02-20 DIAGNOSIS — E119 Type 2 diabetes mellitus without complications: Secondary | ICD-10-CM

## 2011-02-20 MED ORDER — GLUCOSE BLOOD VI STRP: ORAL_STRIP | Status: DC

## 2011-03-30 ENCOUNTER — Ambulatory Visit (INDEPENDENT_AMBULATORY_CARE_PROVIDER_SITE_OTHER): Payer: Medicare Other | Admitting: Internal Medicine

## 2011-03-30 ENCOUNTER — Encounter: Payer: Self-pay | Admitting: Internal Medicine

## 2011-03-30 ENCOUNTER — Other Ambulatory Visit: Payer: Self-pay | Admitting: Internal Medicine

## 2011-03-30 DIAGNOSIS — M79673 Pain in unspecified foot: Secondary | ICD-10-CM

## 2011-03-30 DIAGNOSIS — M79609 Pain in unspecified limb: Secondary | ICD-10-CM

## 2011-03-30 DIAGNOSIS — E785 Hyperlipidemia, unspecified: Secondary | ICD-10-CM

## 2011-03-30 DIAGNOSIS — F17200 Nicotine dependence, unspecified, uncomplicated: Secondary | ICD-10-CM

## 2011-03-30 DIAGNOSIS — M79642 Pain in left hand: Secondary | ICD-10-CM

## 2011-03-30 DIAGNOSIS — M069 Rheumatoid arthritis, unspecified: Secondary | ICD-10-CM

## 2011-03-30 DIAGNOSIS — E119 Type 2 diabetes mellitus without complications: Secondary | ICD-10-CM

## 2011-03-30 HISTORY — DX: Pain in unspecified foot: M79.673

## 2011-03-30 LAB — POCT GLYCOSYLATED HEMOGLOBIN (HGB A1C): Hemoglobin A1C: 8.1

## 2011-03-30 MED ORDER — NAPROXEN SODIUM 220 MG PO TABS: ORAL_TABLET | ORAL | Status: DC

## 2011-03-30 MED ORDER — "INSULIN SYRINGE 30G X 1/2"" 0.5 ML MISC": Status: DC

## 2011-03-30 MED ORDER — PRAVASTATIN SODIUM 40 MG PO TABS: 40.0000 mg | ORAL_TABLET | Freq: Every day | ORAL | Status: DC

## 2011-03-30 NOTE — Assessment & Plan Note (Signed)
Control has slightly worsened, as HbA1c up to 8.1% today, increased from 7.7% in April.  Has not been using his nighttime insulin regularly.    Plan:  -Emphasized importance of regular use of nighttime NPH insulin 13 units           -Schedule follow-up for diabetic eye exam (Dr. Roger Kill in [place], [place])           -Follow-up to in 2-3 months

## 2011-03-30 NOTE — Assessment & Plan Note (Signed)
Suspicious for plantar fasciitis.  Due to intact sensation on foot exam, less likely to be diabetic neuropathy.  Due to lack of inflammatory signs on exam, foot pain is less likely to be rheumatologic.  Plan: -Aleve over-the-counter 220mg  one to two tablets twice per day by mouth to be taken regularly.          -Referral to sports medicine clinic

## 2011-03-30 NOTE — Assessment & Plan Note (Addendum)
Saw an outside rheumatologist Dr. Kellie Simmering.  We have a copy of most recent note.  XRays found no evidence of erosions or osteopenia in either hand.  L foot XRay show questionable periarticular osteopenia and cystic changes in several MTP heads, which were unchanged since 10/01/05.  Dr. Kellie Simmering concluded that RA was an incorrect diagnosis despite positive RF and CCP.  He diagnosed trigger finger in Riley Jones's Left 3rd digit and have an injection of Depo Medrol and Lidocaine.

## 2011-03-30 NOTE — Assessment & Plan Note (Signed)
Discussed smoking cessation today.  Riley Jones is not ready to quit at this time.

## 2011-03-30 NOTE — Progress Notes (Addendum)
   HPI:  Riley Jones is a 55 y.o. male with a history of well-controlled HIV, Type 2 DM, and possible Rheumatoid Arthritis who presents with bilateral foot pain and bilateral hand pain. For his foot pain, onset of the symptoms was about a month ago. The pain has been constant and worse in the L foot (8/10) than the right foot (4/10). The pain has both a burning and achy quality "like the worst toothache you can imagine". The pain is worst when walking, and he has difficulty with pain in bed as touching his feet together is very painful. He does report a history of plantar fasciitis and suspects this could be the cause. He has tried ibuprofen for the pain a few times but has not taken it regularly. He reports no decrease in sensation, no tingling, and no coldness in his feet.  His hand pain has been present for years. Pain is worst in the left hand, and has more of a tingling component than his feet. He gives a history of carpal tunnel release in L wrist and trigger finger releases in 2nd and 4th/5th digits of left hand. He also previously was diagnosed with carpal tunnel in R wrist but did not undergo surgery. He complains of swelling in proximal phalanx of Left 3rd digit and restriction of flexion of Left 3rd digit.  He went to see his rheumatologist, who felt that Mr. Mcquown may not have Rheumatoid Arthritis as previously diagnosed.   ROS:  Constitutional: Positive for fatigue.  Negative for weight loss, fever, chills. Rest: Per HPI  PHYSICAL EXAM:  General: alert, well-developed, and cooperative to examination.  Head: normocephalic and atraumatic.  Mouth: pharynx pink and moist, no erythema, and no exudates.  Neck: supple, full ROM, no thyromegaly, and no JVD.  Lungs: normal respiratory effort, no accessory muscle use, normal breath sounds, no crackles, and no wheezes. Heart: normal rate, regular rhythm, no murmur, no gallop, and no rub.  Abdomen: soft, non-tender, normal bowel sounds, no  distention.  Msk: Hands: Right hand shows no significant swelling or erythema in the fingers.  Very little tenderness to palpation                      Left hand shows notable swelling of the proximal phalanx of 3rd digit with moderate associated tenderness to palpation.  ROM of 3rd digit restricted in flexion.  Other digits have full ROM.                      Hand strength 5/5 bilaterally.         Feet:  No erythema or swelling in either foot.  No evidence of skin breakdown.  5/5 strength bilaterally.  Full ROM.  Mild tenderness to palpation on heel and under arch bilaterally.    Extremities: No cyanosis, clubbing, edema.  Sensation to monofilament test normal in plantar surfaces of both feet and all toes. Neurologic: alert & oriented X3, cranial nerves II-XII intact, strength normal in all extremities, sensation intact to light touch, and gait normal.  Skin: turgor normal and no rashes.     Patient seen and examined. I agree with assessment and plan of Dr. Larey Seat.

## 2011-03-30 NOTE — Progress Notes (Deleted)
  Subjective:    Patient ID: Riley Jones, male    DOB: [birth year], 55 y.o.   MRN: [id number]  HPI Subjective:    Riley Jones is a 55 y.o. male with a history of well-controlled HIV, Type 2 DM, and possible Rheumatoid Arthritis who presents with bilateral foot pain and bilateral hand pain. For his foot pain, onset of the symptoms was about a month ago.  The pain has been constant and worse in the L foot (8/10) than the right foot (4/10).  The pain has both a burning and achy quality "like the worst toothache you can imagine".  The pain is worst when walking, and he has difficulty with pain in bed as touching his feet together is very painful. He does report a history of plantar fasciitis and suspects this could be the cause.  He has tried ibuprofen for the pain a few times but has not taken it regularly.  He reports no decrease in sensation, no tingling, and no coldness in his feet.  His hand pain has been present for years.  Pain is worst in the left hand, and has more of a tingling component than his feet.  He gives a history of carpal tunnel release in L wrist and trigger finger releases in 2nd and 4th/5th digits of left hand.  He also previously was diagnosed with carpal tunnel in R wrist but did not undergo surgery.  He complains of swelling in proximal phalanx of Left 3rd digit and restriction of flexion of Left 3rd digit.   He went to see his rheumatologist, who explained that Mr. Cottier may not have Rheumatoid Arthritis.      Review of Systems Constitutional: positive for fatigue    Objective:    BP 123/77  Pulse 57  Temp(Src) 96.5 F (35.8 C) (Oral)  Ht 5\' 9"  (1.753 m)  Wt 209 lb 11.2 oz (95.119 kg)  BMI 30.97 kg/m2 Right foot:  {exam; foot:5774}  Left foot:  {exam; root:5774}   Imaging: X-ray of {right/left foot:16097}: {findings:5769::"no fracture, dislocation, swelling or degenerative changes noted"}    Assessment:    {Foot pain Dx:18408}    Plan:    {Plan; foot  pain:18409}    Review of Systems  Constitutional: Positive for fatigue. Negative for fever, chills, diaphoresis, activity change, appetite change and unexpected weight change.  HENT: Positive for rhinorrhea and ear discharge. Negative for hearing loss, ear pain, congestion, neck pain and neck stiffness.   Eyes: Positive for visual disturbance.  Respiratory: Positive for cough. Negative for chest tightness and shortness of breath.   Cardiovascular: Positive for chest pain.  Gastrointestinal: Negative for abdominal distention.  Musculoskeletal: Positive for joint swelling and arthralgias. Negative for myalgias, back pain and gait problem.  Neurological: Positive for weakness. Negative for tremors, numbness and headaches.       Objective:   Physical Exam        Assessment & Plan:

## 2011-03-30 NOTE — Assessment & Plan Note (Signed)
Under good control per lipid panel in April.  Plan: Continue Pravastatin 40mg  per day.  Renewed today.

## 2011-03-30 NOTE — Patient Instructions (Addendum)
We have referred you to the sports medicine clinic for the first available appointment.    As we discussed, in the meanwhile we recommend you take over the counter Aleve 220mg  1-2 tablets twice per day by mouth with food.    Follow-up here in internal medicine clinic in 2-3 months.

## 2011-03-30 NOTE — Assessment & Plan Note (Signed)
Exam does not show significant evidence of inflammation.  Suspicious for trigger finger due to restricted ROM of 3rd digit and history.  Plan: -Referral to sports medicine clinic

## 2011-04-06 ENCOUNTER — Other Ambulatory Visit: Payer: Self-pay | Admitting: Internal Medicine

## 2011-04-10 ENCOUNTER — Encounter: Payer: Self-pay | Admitting: Family Medicine

## 2011-04-10 ENCOUNTER — Ambulatory Visit (INDEPENDENT_AMBULATORY_CARE_PROVIDER_SITE_OTHER): Payer: Medicare Other | Admitting: Family Medicine

## 2011-04-10 VITALS — BP 136/82 | HR 81 | Ht 69.5 in | Wt 209.0 lb

## 2011-04-10 DIAGNOSIS — M79673 Pain in unspecified foot: Secondary | ICD-10-CM

## 2011-04-10 DIAGNOSIS — M79609 Pain in unspecified limb: Secondary | ICD-10-CM

## 2011-04-10 NOTE — Progress Notes (Signed)
  Subjective:    Patient ID: Riley Jones, male    DOB: 1956-06-19, 55 y.o.   MRN: [id number]  HPI  Complaining of long-standing bilateral foot pain. This has gotten worse in the last few months. He's unclear why as there's been no specific injury. Pain is mostly on the plantar portion of the foot. Sometimes it's an 8 or 9/10. It gets worse the more he stands on his feet. There is no swelling. He seen no rash. He's had no foot injury that he is aware of.   PERTINENT  PMH / PSH: Diabetes mellitus, HIV with undetectable viral load per patient. Review of Systems    denies weight gain or loss, no fever. No new medications. 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 There's some excessive callus on the medial heel of both feet and the metatarsal head of the first ray. There is no rash or erythema or lesions on his feet. He has intact sensation to soft touch. Marland Kitchen GAIT: Fairly short stride length with a little bit of a shuffle but no specific plan.   Patient was fitted for a : standard, cushioned, semi-rigid orthotic. The orthotic was heated, placed on the orthotic stand. The patient was positioned in subtalar neutral position and 10 degrees of ankle dorsiflexion in a weight bearing stance on the heated orthotic blank After completion of molding, a stable base was applied to the orthotic blank. The blank was ground to a stable position for weight bearing. Blank: pink diabetic foot blank Base:blue foam  Posting:Small MT pads  Size:9  Assessment & Plan:  1. Bilateral foot pain I think related to cavus foot. Today we may have some custom molded orthotics. We'll see if this works for him he can return when necessary. FACE TO FACE time 45 minutes

## 2011-04-13 ENCOUNTER — Encounter: Payer: Self-pay | Admitting: *Deleted

## 2011-04-13 NOTE — Progress Notes (Unsigned)
Pt presents asking for diabetic shoes, will need a script, he will probably use [facility].

## 2011-04-13 NOTE — Progress Notes (Signed)
Filled out prescription form to be faxed to pharmacy. Thanks!

## 2011-04-20 ENCOUNTER — Other Ambulatory Visit: Payer: Self-pay | Admitting: Licensed Clinical Social Worker

## 2011-04-20 DIAGNOSIS — B20 Human immunodeficiency virus [HIV] disease: Secondary | ICD-10-CM

## 2011-04-20 DIAGNOSIS — K219 Gastro-esophageal reflux disease without esophagitis: Secondary | ICD-10-CM

## 2011-04-20 MED ORDER — OMEPRAZOLE 20 MG PO CPDR: 20.0000 mg | DELAYED_RELEASE_CAPSULE | Freq: Every day | ORAL | Status: DC

## 2011-04-20 MED ORDER — EFAVIRENZ-EMTRICITAB-TENOFOVIR 600-200-300 MG PO TABS: 1.0000 | ORAL_TABLET | Freq: Every day | ORAL | Status: DC

## 2011-05-25 ENCOUNTER — Other Ambulatory Visit: Payer: Medicare Other

## 2011-05-25 ENCOUNTER — Other Ambulatory Visit: Payer: Self-pay | Admitting: Infectious Diseases

## 2011-05-25 DIAGNOSIS — B20 Human immunodeficiency virus [HIV] disease: Secondary | ICD-10-CM

## 2011-05-25 LAB — CBC WITH DIFFERENTIAL/PLATELET
Basophils Absolute: 0 K/uL (ref 0.0–0.1)
Basophils Relative: 0 % (ref 0–1)
Eosinophils Absolute: 0.3 K/uL (ref 0.0–0.7)
Eosinophils Relative: 3 % (ref 0–5)
HCT: 43 % (ref 39.0–52.0)
Hemoglobin: 15.3 g/dL (ref 13.0–17.0)
Lymphocytes Relative: 24 % (ref 12–46)
Lymphs Abs: 2.2 K/uL (ref 0.7–4.0)
MCH: 33 pg (ref 26.0–34.0)
MCHC: 35.6 g/dL (ref 30.0–36.0)
MCV: 92.9 fL (ref 78.0–100.0)
Monocytes Absolute: 0.6 K/uL (ref 0.1–1.0)
Monocytes Relative: 6 % (ref 3–12)
Neutro Abs: 6.2 K/uL (ref 1.7–7.7)
Neutrophils Relative %: 67 % (ref 43–77)
Platelets: 112 K/uL — ABNORMAL LOW (ref 150–400)
RBC: 4.63 MIL/uL (ref 4.22–5.81)
RDW: 13.2 % (ref 11.5–15.5)
WBC: 9.2 K/uL (ref 4.0–10.5)

## 2011-05-25 LAB — COMPREHENSIVE METABOLIC PANEL WITH GFR
ALT: 44 U/L (ref 0–53)
AST: 31 U/L (ref 0–37)
Albumin: 4.3 g/dL (ref 3.5–5.2)
Alkaline Phosphatase: 89 U/L (ref 39–117)
BUN: 16 mg/dL (ref 6–23)
CO2: 24 meq/L (ref 19–32)
Calcium: 9.8 mg/dL (ref 8.4–10.5)
Chloride: 105 meq/L (ref 96–112)
Creat: 0.84 mg/dL (ref 0.50–1.35)
Glucose, Bld: 150 mg/dL — ABNORMAL HIGH (ref 70–99)
Potassium: 4.2 meq/L (ref 3.5–5.3)
Sodium: 138 meq/L (ref 135–145)
Total Bilirubin: 0.4 mg/dL (ref 0.3–1.2)
Total Protein: 6.8 g/dL (ref 6.0–8.3)

## 2011-05-26 ENCOUNTER — Other Ambulatory Visit: Payer: Medicare Other

## 2011-05-26 LAB — T-HELPER CELL (CD4) - (RCID CLINIC ONLY)
CD4 % Helper T Cell: 45 % (ref 33–55)
CD4 T Cell Abs: 960 uL (ref 400–2700)

## 2011-05-29 ENCOUNTER — Ambulatory Visit (INDEPENDENT_AMBULATORY_CARE_PROVIDER_SITE_OTHER): Payer: Medicare Other | Admitting: Internal Medicine

## 2011-05-29 ENCOUNTER — Encounter: Payer: Self-pay | Admitting: Internal Medicine

## 2011-05-29 DIAGNOSIS — E119 Type 2 diabetes mellitus without complications: Secondary | ICD-10-CM

## 2011-05-29 DIAGNOSIS — I10 Essential (primary) hypertension: Secondary | ICD-10-CM

## 2011-05-29 DIAGNOSIS — E785 Hyperlipidemia, unspecified: Secondary | ICD-10-CM

## 2011-05-29 NOTE — Progress Notes (Signed)
  Subjective:    Patient ID: Riley Jones, male    DOB: December 28, 1955, 55 y.o.   MRN: [id number]  HPI  Patient is 55 year old male with past medical history outlined below who presents to clinic for followup on diabetes, hypertension and cholesterol. He tells me he is not taking his insulin and would like to be taken off of it. He also tells me that he has started a new exercise and diet regimen and is planning on quitting smoking. She would like to try diet and exercise and if that does not work then continue insulin. He reports compliance with other medications. He denies recent sicknesses or hospitalizations, no episodes of chest pain shortness of breath, no abdominal or urinary concerns  Review of Systems Constitutional: Denies fever, chills, diaphoresis, appetite change and fatigue.  HEENT: Denies photophobia, eye pain, redness, hearing loss, ear pain, congestion, sore throat, rhinorrhea, sneezing, mouth sores, trouble swallowing, neck pain, neck stiffness and tinnitus.   Respiratory: Denies SOB, DOE, cough, chest tightness,  and wheezing.   Cardiovascular: Denies chest pain, palpitations and leg swelling.  Gastrointestinal: Denies nausea, vomiting, abdominal pain, diarrhea, constipation, blood in stool and abdominal distention.  Genitourinary: Denies dysuria, urgency, frequency, hematuria, flank pain and difficulty urinating.  Musculoskeletal: Denies myalgias, back pain, joint swelling, arthralgias and gait problem.  Skin: Denies pallor, rash and wound.  Neurological: Denies dizziness, seizures, syncope, weakness, light-headedness, numbness and headaches.  Hematological: Denies adenopathy. Easy bruising, personal or family bleeding history  Psychiatric/Behavioral: Denies suicidal ideation, mood changes, confusion, nervousness, sleep disturbance and agitation      Objective:   Physical Exam  Constitutional: Vital signs reviewed.  Patient is a well-developed and well-nourished  in no acute  distress and cooperative with exam. Alert and oriented x3.  Neck: Supple, Trachea midline normal ROM, No JVD, mass, thyromegaly, or carotid bruit present.  Cardiovascular: RRR, S1 normal, S2 normal, no MRG, pulses symmetric and intact bilaterally Pulmonary/Chest: CTAB, no wheezes, rales, or rhonchi Neurological: A&O x3, Strenght is normal and symmetric bilaterally, cranial nerve II-XII are grossly intact, no focal motor deficit, sensory intact to light touch bilaterally.          Assessment & Plan:

## 2011-05-29 NOTE — Assessment & Plan Note (Signed)
I have discussed importance of compliance with diabetes medications and patient has agreed to continue taking metformin and glipizide. I have also explained to him that he needs to come back in one month for A1c check. He also needs to bring in his monitor today we can evaluate glucose control and readjust medication regimen if indicated. He may need to be on insulin depending on the level of glucose control.

## 2011-05-29 NOTE — Assessment & Plan Note (Signed)
We have discussed goal LDL of less than 70. Patient reports initiating diet and exercise regimen. We'll continue to follow up. For now continue the same medication regimen.

## 2011-05-29 NOTE — Assessment & Plan Note (Signed)
Well-controlled on current medication regimen. He lives with her recently checked and were in normal limits. We'll continue the same.

## 2011-06-09 ENCOUNTER — Other Ambulatory Visit: Payer: Self-pay | Admitting: *Deleted

## 2011-06-09 ENCOUNTER — Ambulatory Visit (INDEPENDENT_AMBULATORY_CARE_PROVIDER_SITE_OTHER): Payer: Medicare Other | Admitting: Adult Health

## 2011-06-09 ENCOUNTER — Encounter: Payer: Self-pay | Admitting: Adult Health

## 2011-06-09 VITALS — BP 151/75 | HR 68 | Temp 97.6°F | Wt 206.1 lb

## 2011-06-09 DIAGNOSIS — B20 Human immunodeficiency virus [HIV] disease: Secondary | ICD-10-CM

## 2011-06-09 DIAGNOSIS — Z0000 Encounter for general adult medical examination without abnormal findings: Secondary | ICD-10-CM

## 2011-06-09 DIAGNOSIS — Z79899 Other long term (current) drug therapy: Secondary | ICD-10-CM

## 2011-06-09 DIAGNOSIS — Z23 Encounter for immunization: Secondary | ICD-10-CM

## 2011-06-09 DIAGNOSIS — E291 Testicular hypofunction: Secondary | ICD-10-CM

## 2011-06-09 DIAGNOSIS — Z113 Encounter for screening for infections with a predominantly sexual mode of transmission: Secondary | ICD-10-CM

## 2011-06-09 MED ORDER — GLUCERNA PO LIQD: 1.0000 | Freq: Two times a day (BID) | ORAL | Status: DC

## 2011-06-09 NOTE — Telephone Encounter (Signed)
This is for Riley Jones, they supply Glucerna monthly to pt. He usually gets 2 cases a month. Fax Rx to [phone]  This was sent to ID to complete

## 2011-06-09 NOTE — Progress Notes (Signed)
  Subjective:    Patient ID: Riley Jones, male    DOB: 01/24/1956, 55 y.o.   MRN: [id number]  HPI Presents to clinic for routine scheduled followup. Endorses adherence to his antiretrovirals with good tolerance and no complications. States he has no new diet plan, which is giving him better glucose control, and his glipizide was discontinued, as well as a few other medications by his internist. Remains with complaints of low libido, and chronic, non-exertional fatigue.   Review of Systems  Constitutional: Positive for fatigue. Negative for unexpected weight change.  HENT: Negative.   Eyes: Negative.   Respiratory: Negative.   Cardiovascular: Negative.   Genitourinary: Negative.   Musculoskeletal: Positive for back pain.  Skin: Negative.   Neurological: Positive for numbness.  Hematological: Negative.   Psychiatric/Behavioral: Negative.        Objective:   Physical Exam  Constitutional: He is oriented to person, place, and time. He appears well-developed. No distress.       Overweight appearing  HENT:  Head: Normocephalic and atraumatic.  Eyes: Conjunctivae and EOM are normal. Pupils are equal, round, and reactive to light.  Neck: Normal range of motion. Neck supple.  Cardiovascular: Normal rate and regular rhythm.   Pulmonary/Chest: Effort normal and breath sounds normal.  Abdominal: Soft. Bowel sounds are normal.  Musculoskeletal: Normal range of motion.  Lymphadenopathy:    He has no cervical adenopathy.  Neurological: He is alert and oriented to person, place, and time.  Skin: Skin is warm and dry.  Psychiatric: He has a normal mood and affect. His behavior is normal. Judgment and thought content normal.          Assessment & Plan:  1. HIV. Labs obtained 05/25/2011. Show a CD4 count of 960 at 45% with a viral load of 40 copies per mL. Mild virologic blip but otherwise, clinically stable. Recommend continuing present management and return for followup in 4 months  with repeat fasting labs 2 weeks before next appointment.  2. Low Libido with Chronic, Nonexertional, Fatigue. Has a history of hypogonadism for which previously. He was treated with Androderm, but has not been on this for some time and has not had any recent evaluation of his serum testosterone. We will obtain those values today and review them and if necessary, restart testosterone replacement therapy.  He verbally acknowledged all information was related to him and agreed with plan of care.

## 2011-06-10 LAB — TESTOSTERONE, FREE, TOTAL, SHBG
Testosterone, Free: 35.3 pg/mL — ABNORMAL LOW (ref 47.0–244.0)
Testosterone-% Free: 2 % (ref 1.6–2.9)
Testosterone: 180.13 ng/dL — ABNORMAL LOW (ref 250–890)

## 2011-06-15 ENCOUNTER — Other Ambulatory Visit: Payer: Self-pay | Admitting: *Deleted

## 2011-06-15 ENCOUNTER — Ambulatory Visit: Payer: Medicare Other

## 2011-06-15 DIAGNOSIS — B20 Human immunodeficiency virus [HIV] disease: Secondary | ICD-10-CM

## 2011-06-15 MED ORDER — EFAVIRENZ-EMTRICITAB-TENOFOVIR 600-200-300 MG PO TABS: 1.0000 | ORAL_TABLET | Freq: Every day | ORAL | Status: DC

## 2011-06-17 ENCOUNTER — Other Ambulatory Visit: Payer: Self-pay | Admitting: *Deleted

## 2011-06-17 DIAGNOSIS — B20 Human immunodeficiency virus [HIV] disease: Secondary | ICD-10-CM

## 2011-06-17 MED ORDER — EFAVIRENZ-EMTRICITAB-TENOFOVIR 600-200-300 MG PO TABS: 1.0000 | ORAL_TABLET | Freq: Every day | ORAL | Status: DC

## 2011-06-18 ENCOUNTER — Other Ambulatory Visit: Payer: Self-pay | Admitting: *Deleted

## 2011-06-18 DIAGNOSIS — K219 Gastro-esophageal reflux disease without esophagitis: Secondary | ICD-10-CM

## 2011-06-18 DIAGNOSIS — B20 Human immunodeficiency virus [HIV] disease: Secondary | ICD-10-CM

## 2011-06-18 MED ORDER — OMEPRAZOLE 20 MG PO CPDR: 20.0000 mg | DELAYED_RELEASE_CAPSULE | Freq: Every day | ORAL | Status: DC

## 2011-06-18 MED ORDER — EFAVIRENZ-EMTRICITAB-TENOFOVIR 600-200-300 MG PO TABS: 1.0000 | ORAL_TABLET | Freq: Every day | ORAL | Status: DC

## 2011-06-22 LAB — T-HELPER CELL (CD4) - (RCID CLINIC ONLY): CD4 % Helper T Cell: 38

## 2011-06-24 ENCOUNTER — Telehealth: Payer: Self-pay | Admitting: *Deleted

## 2011-06-24 NOTE — Telephone Encounter (Signed)
Pharmacy would like to change this to Novolin.  This will be cheaper for the patient.  Pt uses [facility] on [address].

## 2011-06-25 MED ORDER — INSULIN NPH (HUMAN) (ISOPHANE) 100 UNIT/ML ~~LOC~~ SUSP: 13.0000 [IU] | Freq: Every day | SUBCUTANEOUS | Status: DC

## 2011-06-29 ENCOUNTER — Ambulatory Visit (INDEPENDENT_AMBULATORY_CARE_PROVIDER_SITE_OTHER): Payer: Medicare Other | Admitting: Internal Medicine

## 2011-06-29 ENCOUNTER — Encounter: Payer: Self-pay | Admitting: Internal Medicine

## 2011-06-29 VITALS — BP 137/78 | HR 63 | Temp 97.3°F | Wt 210.5 lb

## 2011-06-29 DIAGNOSIS — F17200 Nicotine dependence, unspecified, uncomplicated: Secondary | ICD-10-CM

## 2011-06-29 DIAGNOSIS — M546 Pain in thoracic spine: Secondary | ICD-10-CM

## 2011-06-29 DIAGNOSIS — E119 Type 2 diabetes mellitus without complications: Secondary | ICD-10-CM

## 2011-06-29 DIAGNOSIS — B20 Human immunodeficiency virus [HIV] disease: Secondary | ICD-10-CM

## 2011-06-29 DIAGNOSIS — Z23 Encounter for immunization: Secondary | ICD-10-CM

## 2011-06-29 DIAGNOSIS — I10 Essential (primary) hypertension: Secondary | ICD-10-CM

## 2011-06-29 LAB — POCT GLYCOSYLATED HEMOGLOBIN (HGB A1C): Hemoglobin A1C: 7

## 2011-06-29 LAB — GLUCOSE, CAPILLARY: Glucose-Capillary: 172 mg/dL — ABNORMAL HIGH (ref 70–99)

## 2011-06-29 MED ORDER — CYCLOBENZAPRINE HCL 5 MG PO TABS
5.0000 mg | ORAL_TABLET | Freq: Three times a day (TID) | ORAL | Status: DC | PRN
Start: 2011-06-29 — End: 2012-03-28

## 2011-06-29 MED ORDER — INSULIN NPH (HUMAN) (ISOPHANE) 100 UNIT/ML ~~LOC~~ SUSP: 12.0000 [IU] | Freq: Every day | SUBCUTANEOUS | Status: DC

## 2011-06-29 NOTE — Assessment & Plan Note (Signed)
Well-controlled  at this time 

## 2011-06-29 NOTE — Progress Notes (Signed)
  Subjective:    Patient ID: Riley Jones, male    DOB: [birth year], 55 y.o.   MRN: [id number]  HPI  Patient is a 55 year old man with past medical history of HIV is well-controlled, diabetes, hypertension, hyperlipidemia and ongoing tobacco abuse and his main problems.   Patient comes in today with a problem of back pain, and mentions that he's been having pain in the middle of his upper back since last 1 month. He denies picking up any heavy objects, denies any changes in rigidity of bowel habits, he denies any radiation of the pain, he mentions that pain is 10 out of 10 at its worse and comes in  Spasms.   The patient says that he is well-controlled with the last blood alertness and 48 CD4 count more than 500.  Patient's diabetes is uncontrolled today with HbA1c coming down from 8.2 >>>7.0 today.  Foot exam was done today.  The patient's lipid profile was checked recently in Feb 2012.  I spent about 10 minutes on smoking counseling. He said that he is not ready to quit at this time. She was given that his options including nicotine patches gums and bupropion. Patient denied and wants to quit cold [place].  Patient is due for a Pneumovax today.  Blood pressure is well-controlled today.    Review of Systems  Constitutional: Negative for fever, activity change and appetite change.  HENT: Negative for sore throat.   Respiratory: Negative for cough and shortness of breath.   Cardiovascular: Negative for chest pain and leg swelling.  Gastrointestinal: Negative for nausea, abdominal pain, diarrhea, constipation and abdominal distention.  Genitourinary: Negative for frequency, hematuria and difficulty urinating.  Musculoskeletal: Positive for back pain and arthralgias.  Neurological: Negative for dizziness and headaches.  Psychiatric/Behavioral: Negative for suicidal ideas and behavioral problems.       Objective:   Physical Exam  Constitutional: He is oriented to person, place, and  time. He appears well-developed and well-nourished.  HENT:  Head: Normocephalic and atraumatic.  Eyes: Conjunctivae and EOM are normal. Pupils are equal, round, and reactive to light. No scleral icterus.  Neck: Normal range of motion. Neck supple. No JVD present. No thyromegaly present.  Cardiovascular: Normal rate, regular rhythm, normal heart sounds and intact distal pulses.  Exam reveals no gallop and no friction rub.   No murmur heard. Pulmonary/Chest: Effort normal and breath sounds normal. No respiratory distress. He has no wheezes. He has no rales.  Abdominal: Soft. Bowel sounds are normal. He exhibits no distension and no mass. There is no tenderness. There is no rebound and no guarding.  Musculoskeletal: Normal range of motion. He exhibits tenderness. He exhibits no edema.       Thoracic back: He exhibits spasm.  Lymphadenopathy:    He has no cervical adenopathy.  Neurological: He is alert and oriented to person, place, and time.  Psychiatric: He has a normal mood and affect. His behavior is normal.          Assessment & Plan:

## 2011-06-29 NOTE — Assessment & Plan Note (Signed)
Counseled extensively but not ready to quit at this time.

## 2011-06-29 NOTE — Assessment & Plan Note (Signed)
Well controlled 

## 2011-06-29 NOTE — Assessment & Plan Note (Signed)
It seems like this has been a recurrent problem since last few years. Patient is appropriately taking 7.5 mg Vicodin but I did not see Vicodin as active medication on the med list. Dr. Sharman Crate was prescribing physician. I did not refill Vicodin at this time. Patient's back pain is associated with muscle spasm. There are no red flags at this time. I started the patient on flexeril to relieve muscle spasm. Patient was a little unhappy by the fact that I could not fill the Vicodin. I gave patient option of physical therapy which he refused. Counseled extensively about the need to stop smoking as it has been shown to decrease back pain. She was asked to come back in one month and see his primary care physician or sooner if the pain doesn't get better. He was also advised to use warm compresses and was given instructions to manage back pain conservatively.

## 2011-06-29 NOTE — Patient Instructions (Signed)
Back Pain (Lumbosacral Strain) Back pain is one of the most common causes of pain. There are many causes of back pain. Most are not serious conditions.  CAUSES Your backbone (spinal column) is made up of 24 main vertebral bodies, the sacrum, and the coccyx. These are held together by muscles and tough, fibrous tissue (ligaments). Nerve roots pass through the openings between the vertebrae. A sudden move or injury to the back may cause injury to, or pressure on, these nerves. This may result in localized back pain or pain movement (radiation) into the buttocks, down the leg, and into the foot. Sharp, shooting pain from the buttock down the back of the leg (sciatica) is frequently associated with a ruptured (herniated) disc. Pain may be caused by muscle spasm alone. Your caregiver can often find the cause of your pain by the details of your symptoms and an exam. In some cases, you may need tests (such as X-rays). Your caregiver will work with you to decide if any tests are needed based on your specific exam. HOME CARE INSTRUCTIONS  Avoid an underactive lifestyle. Active exercise, as directed by your caregiver, is your greatest weapon against back pain.   Avoid hard physical activities (tennis, racquetball, water-skiing) if you are not in proper physical condition for it. This may aggravate and/or create problems.   If you have a back problem, avoid sports requiring sudden body movements. Swimming and walking are generally safer activities.   Maintain good posture.   Avoid becoming overweight (obese).   Use bed rest for only the most extreme, sudden (acute) episode. Your caregiver will help you determine how much bed rest is necessary.   For acute conditions, you may put ice on the injured area.   Put ice in a plastic bag.   Place a towel between your skin and the bag.   Leave the ice on for 15 minutes at a time, every 2 hours, or as needed.   After you are improved and more active, it may  help to apply heat for 30 minutes before activities.  See your caregiver if you are having pain that lasts longer than expected. Your caregiver can advise appropriate exercises and/or therapy if needed. With conditioning, most back problems can be avoided. SEEK IMMEDIATE MEDICAL CARE IF:  You have numbness, tingling, weakness, or problems with the use of your arms or legs.   You experience severe back pain not relieved with medicines.   There is a change in bowel or bladder control.   You have increasing pain in any area of the body, including your belly (abdomen).   You notice shortness of breath, dizziness, or feel faint.   You feel sick to your stomach (nauseous), are throwing up (vomiting), or become sweaty.   You notice discoloration of your toes or legs, or your feet get very cold.   Your back pain is getting worse.   You have an oral temperature above 101, not controlled by medicine.  MAKE SURE YOU:   Understand these instructions.   Will watch your condition.   Will get help right away if you are not doing well or get worse.  Document Released: 06/24/2005 Document Re-Released: 12/09/2009 [employer] Patient Information 2011 [employer]. 

## 2011-06-29 NOTE — Assessment & Plan Note (Signed)
Control at this time but HbA1c of 7.0. Pneumovax given today. Foot exam done today. Plan continue on current medications.

## 2011-07-03 LAB — POCT I-STAT, CHEM 8
BUN: 13 mg/dL (ref 6–23)
Calcium, Ion: 1.18 mmol/L (ref 1.12–1.32)
Creatinine, Ser: 0.8 mg/dL (ref 0.4–1.5)
Hemoglobin: 17 g/dL (ref 13.0–17.0)
Sodium: 138 mEq/L (ref 135–145)
TCO2: 26 mmol/L (ref 0–100)

## 2011-07-09 LAB — T-HELPER CELL (CD4) - (RCID CLINIC ONLY): CD4 % Helper T Cell: 42

## 2011-07-10 ENCOUNTER — Encounter: Payer: Self-pay | Admitting: Dietician

## 2011-07-22 ENCOUNTER — Other Ambulatory Visit: Payer: Self-pay | Admitting: *Deleted

## 2011-07-22 MED ORDER — AMLODIPINE BESYLATE 10 MG PO TABS: 10.0000 mg | ORAL_TABLET | Freq: Every day | ORAL | Status: DC

## 2011-08-03 ENCOUNTER — Ambulatory Visit (INDEPENDENT_AMBULATORY_CARE_PROVIDER_SITE_OTHER): Payer: Medicare Other | Admitting: Internal Medicine

## 2011-08-03 ENCOUNTER — Encounter: Payer: Self-pay | Admitting: Internal Medicine

## 2011-08-03 ENCOUNTER — Other Ambulatory Visit: Payer: Self-pay | Admitting: Internal Medicine

## 2011-08-03 ENCOUNTER — Ambulatory Visit (HOSPITAL_COMMUNITY)
Admission: RE | Admit: 2011-08-03 | Discharge: 2011-08-03 | Disposition: A | Discharge status: Home / Self Care | Payer: Medicare Other | Source: Ambulatory Visit | Attending: Internal Medicine | Admitting: Internal Medicine

## 2011-08-03 VITALS — BP 128/78 | HR 75 | Temp 96.7°F | Ht 70.0 in | Wt 211.1 lb

## 2011-08-03 DIAGNOSIS — E119 Type 2 diabetes mellitus without complications: Secondary | ICD-10-CM

## 2011-08-03 DIAGNOSIS — M79673 Pain in unspecified foot: Secondary | ICD-10-CM

## 2011-08-03 DIAGNOSIS — M79609 Pain in unspecified limb: Secondary | ICD-10-CM

## 2011-08-03 DIAGNOSIS — F17200 Nicotine dependence, unspecified, uncomplicated: Secondary | ICD-10-CM

## 2011-08-03 DIAGNOSIS — R0609 Other forms of dyspnea: Secondary | ICD-10-CM

## 2011-08-03 DIAGNOSIS — R0989 Other specified symptoms and signs involving the circulatory and respiratory systems: Secondary | ICD-10-CM

## 2011-08-03 DIAGNOSIS — M549 Dorsalgia, unspecified: Secondary | ICD-10-CM

## 2011-08-03 DIAGNOSIS — I10 Essential (primary) hypertension: Secondary | ICD-10-CM

## 2011-08-03 DIAGNOSIS — G4700 Insomnia, unspecified: Secondary | ICD-10-CM

## 2011-08-03 DIAGNOSIS — E785 Hyperlipidemia, unspecified: Secondary | ICD-10-CM

## 2011-08-03 DIAGNOSIS — R0602 Shortness of breath: Secondary | ICD-10-CM

## 2011-08-03 MED ORDER — HYDROCODONE-ACETAMINOPHEN 7.5-500 MG PO TABS: 1.0000 | ORAL_TABLET | Freq: Four times a day (QID) | ORAL | Status: DC | PRN

## 2011-08-03 MED ORDER — LORAZEPAM 1 MG PO TABS: 1.0000 mg | ORAL_TABLET | Freq: Every evening | ORAL | Status: AC | PRN

## 2011-08-03 NOTE — Progress Notes (Signed)
Subjective:   Patient ID: Riley Jones male   DOB: [birth year] 55 y.o.   MRN: [id number]  HPI: Riley Jones is a 55 y.o. man with history of HIV, HTN, Depression, and Type II DM who presents with two months of back pain.  He reports that the pain is located always at the same point over this middle thoracic spine.  It is worse when bending over or when he is very active, and sometimes he tweeks it and has to sit down for a while.  It has mildly responded to Vicodin.  Flexeril has not helped a lot.    He also reports difficulty sleeping.  His main problem is racing thoughts keeping him awake, and he does not report any problems with early waking.  He has been taking Xanax at night with it helping some.    No CP, abd pain, constipation, nausea, vomiting, fevers or chills.  Numbness and tingling in legs is at baseline.    Past Medical History  Diagnosis Date  . Depression     Stable, off meds.  . Type II diabetes mellitus     Well controlled   . Hypertension     Well Controlled off meds  . 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 Sampson Goon ([place])  . Hyperlipidemia   . Atypical angina     Myoview 2003, Normal EF 64%  . Foot pain 03/30/2011   Current Outpatient Prescriptions  Medication Sig Dispense Refill  . albuterol (VENTOLIN HFA) 108 (90 BASE) MCG/ACT inhaler Inhale 1-2 puffs into the lungs every 4 (four) hours as needed. for shortness of breath. Or may use every 6 hours as needed.       Marland Kitchen amLODipine (NORVASC) 10 MG tablet Take 1 tablet (10 mg total) by mouth daily.  31 tablet  11  . aspirin 81 MG EC tablet Take 81 mg by mouth daily.        . cyclobenzaprine (FLEXERIL) 5 MG tablet Take 1 tablet (5 mg total) by mouth 3 (three) times daily as needed for muscle spasms.  60 tablet  1  . efavirenz-emtrictabine-tenofovir (ATRIPLA) 600-200-300 MG per tablet Take 1 tablet by mouth daily.  30 tablet  prn  . FREESTYLE LITE test strip TEST 4  TIMES DAILY AS DIRECTED  100 each  3  . glipiZIDE (GLUCOTROL) 10 MG tablet TAKE TWO TABLETS BY MOUTH IN THE MORNING AND ONE TABLET IN THE EVENING.  93 tablet  6  . Glucerna (GLUCERNA) LIQD Take 1 Can by mouth 2 (two) times daily.  1500 mL  prn  . HYDROcodone-acetaminophen (LORTAB) 7.5-500 MG per tablet Take 1 tablet by mouth every 6 (six) hours as needed. Prescribed by Dr Aldine Contes handwritten.  30 tablet  0  . Insulin Syringe-Needle U-100 (INSULIN SYRINGE .5CC/30GX1/2") 30G X 1/2" 0.5 ML MISC Use to inject insulin once daily  100 each  2  . lisinopril (PRINIVIL,ZESTRIL) 20 MG tablet Take 20 mg by mouth daily.        . metFORMIN (GLUCOPHAGE) 1000 MG tablet Take 1 tablet (1,000 mg total) by mouth 2 (two) times daily with a meal.  60 tablet  11  . mometasone (NASONEX) 50 MCG/ACT nasal spray 2 sprays daily as needed. In each nostril      . omeprazole (PRILOSEC) 20 MG capsule Take 1 capsule (20 mg total) by mouth daily.  30 capsule  prn  . pravastatin (PRAVACHOL) 40 MG tablet Take  1 tablet (40 mg total) by mouth at bedtime.  90 tablet  4  . terazosin (HYTRIN) 2 MG capsule Take 2 capsules (4 mg total) by mouth at bedtime.  60 capsule  11  . insulin NPH (NOVOLIN N) 100 UNIT/ML injection Inject 12 Units into the skin at bedtime.  10 mL  3  . LORazepam (ATIVAN) 1 MG tablet Take 1 tablet (1 mg total) by mouth at bedtime as needed for anxiety. Take as needed for sleep  30 tablet  0    History   Social History  . Marital Status: Single    Spouse Name: N/A    Number of Children: N/A  . Years of Education: N/A   Occupational History  . Retired [employer] architect    Social History Main Topics  . Smoking status: Current Everyday Smoker -- 1.0 packs/day    Types: Cigarettes  . Smokeless tobacco: Never Used   Comment: ready to quit, has Medicare now  . Alcohol Use: No  . Drug Use: None  . Sexually Active: No     declined condoms   Other Topics Concern  . None   Social History Narrative   [employer]  approved beginning 12/11/2009 - 3/31/2012Ryan White benefits approved; patient eligible for 100% discount for out patient labs and office visits. Patient eligible for 70% discount for other services per Kandice Robinsons 12/12/2011Patient is on disability, and walks 2-3 times per week.    Review of Systems: Constitutional: Denies fever, chills, diaphoresis. Respiratory: Denies chest tightness and wheezing.   Cardiovascular: Denies chest pain, palpitations. Gastrointestinal: Denies nausea, vomiting, abdominal pain, diarrhea, constipation. Genitourinary: Denies dysuria, urgency, frequency, hematuria   Objective:  Physical Exam: Filed Vitals:   08/03/11 1400  BP: 128/78  Pulse: 75  Temp: 96.7 F (35.9 C)  TempSrc: Oral  Height: 5\' 10"  (1.778 m)  Weight: 211 lb 1.6 oz (95.754 kg)   Constitutional: Vital signs reviewed.  Patient is a well-developed and well-nourished in no acute distress and cooperative with exam. Alert and oriented x3.  Head: Normocephalic and atraumatic Cardiovascular: RRR, S1 normal, S2 normal, no MRG, pulses symmetric and intact bilaterally Pulmonary/Chest: CTAB, no wheezes, rales, or rhonchi  Musculoskeletal: Tenderness to palpation over spine centrally in middle thoracic spine.  Pain with active ROM of back in flexion, extension, left tilt, and rotation in either direction of back.  No pain with neck ROM.      Assessment & Plan:

## 2011-08-03 NOTE — Assessment & Plan Note (Signed)
LDL at goal in April 2012.  Continue Pravastatin

## 2011-08-03 NOTE — Assessment & Plan Note (Signed)
HbA1c 7.0% 06/29/11.  On Metformin, Glipizide, and NPH insulin at night.  Will continue current regimen.  Up to date on all diabetic health maintenance.

## 2011-08-03 NOTE — Assessment & Plan Note (Signed)
BP at goal today.

## 2011-08-03 NOTE — Assessment & Plan Note (Signed)
Difficulty falling asleep, no problem staying asleep.  Xanax has helped some.  Will try Ativan instead to see if it works better.  RTC in one month to evaluate efficacy and to re-evaluate back pain.

## 2011-08-03 NOTE — Assessment & Plan Note (Deleted)
Patient is using albuterol inhaler daily at home for SOB.  Reports distant diagnosis of Asthma and long smoking history.  Will order spirometry for evaluation of possible asthma vs. Copd.  If COPD, should start Spiriva.  If Asthma, should start Flovent.

## 2011-08-03 NOTE — Assessment & Plan Note (Signed)
Patient is using albuterol inhaler daily at home for SOB.  Reports distant diagnosis of Asthma and long smoking history.  Will order spirometry for evaluation of possible asthma vs. Copd.  If COPD, should start Spiriva.  If Asthma, should start Flovent.   

## 2011-08-03 NOTE — Assessment & Plan Note (Addendum)
Two months of focal back pain in thoracic pain.  No definite associated trauma.  Fell down cellar stairs about the same time but this did not correlate with the exact start of the pain.  Will order XRay to evaluate for fracture.  No signs of symptoms of cord compression (no radiculopathy, no urinary incontinence, no new numbness or tingling).  Re-ordered one month supply of Vicodin today, will RTC 1 month for re-eval of back pain and insomnia

## 2011-08-03 NOTE — Patient Instructions (Signed)
Please return to clinic in 1 month

## 2011-08-03 NOTE — Assessment & Plan Note (Signed)
Discussed quitting with the patient.  He does not think he is currently ready to commit to quitting.  Should be addressed at next visit.

## 2011-08-03 NOTE — Assessment & Plan Note (Signed)
Patient reports that this is much improved with his diabetic shoes and orthotics for his sneakers that he got from Sports Medicine doc.

## 2011-08-04 NOTE — Progress Notes (Signed)
I discussed Mr [facility] care of plan with Dr Yaakov Guthrie and agree with his note and A/P.

## 2011-08-31 ENCOUNTER — Other Ambulatory Visit: Payer: Self-pay | Admitting: Internal Medicine

## 2011-08-31 ENCOUNTER — Other Ambulatory Visit: Payer: Self-pay | Admitting: Family Medicine

## 2011-08-31 DIAGNOSIS — M549 Dorsalgia, unspecified: Secondary | ICD-10-CM

## 2011-09-04 NOTE — Telephone Encounter (Signed)
Refill for Vicodin called to pt's pharmacy.  Angelina Ok, RN 09/04/2011 3:39 PM.

## 2011-09-14 ENCOUNTER — Ambulatory Visit (INDEPENDENT_AMBULATORY_CARE_PROVIDER_SITE_OTHER): Payer: Medicare Other | Admitting: Internal Medicine

## 2011-09-14 ENCOUNTER — Other Ambulatory Visit: Payer: Self-pay | Admitting: Internal Medicine

## 2011-09-14 ENCOUNTER — Encounter: Payer: Self-pay | Admitting: Internal Medicine

## 2011-09-14 DIAGNOSIS — I10 Essential (primary) hypertension: Secondary | ICD-10-CM

## 2011-09-14 DIAGNOSIS — E119 Type 2 diabetes mellitus without complications: Secondary | ICD-10-CM

## 2011-09-14 DIAGNOSIS — F17200 Nicotine dependence, unspecified, uncomplicated: Secondary | ICD-10-CM

## 2011-09-14 DIAGNOSIS — E785 Hyperlipidemia, unspecified: Secondary | ICD-10-CM

## 2011-09-14 DIAGNOSIS — R0602 Shortness of breath: Secondary | ICD-10-CM

## 2011-09-14 DIAGNOSIS — G4700 Insomnia, unspecified: Secondary | ICD-10-CM

## 2011-09-14 DIAGNOSIS — M549 Dorsalgia, unspecified: Secondary | ICD-10-CM

## 2011-09-14 MED ORDER — GLIPIZIDE ER 10 MG PO TB24: 10.0000 mg | ORAL_TABLET | Freq: Every day | ORAL | Status: DC

## 2011-09-14 MED ORDER — METFORMIN HCL 1000 MG PO TABS: 1000.0000 mg | ORAL_TABLET | Freq: Two times a day (BID) | ORAL | Status: DC

## 2011-09-14 MED ORDER — MELOXICAM 15 MG PO TABS: 15.0000 mg | ORAL_TABLET | Freq: Every day | ORAL | Status: DC

## 2011-09-14 MED ORDER — LORAZEPAM 1 MG PO TABS: 1.0000 mg | ORAL_TABLET | Freq: Every evening | ORAL | Status: AC | PRN

## 2011-09-14 MED ORDER — LORAZEPAM 1 MG PO TABS: 1.0000 mg | ORAL_TABLET | Freq: Every evening | ORAL | Status: DC | PRN

## 2011-09-14 MED ORDER — TERAZOSIN HCL 2 MG PO CAPS: 4.0000 mg | ORAL_CAPSULE | Freq: Every day | ORAL | Status: DC

## 2011-09-14 NOTE — Patient Instructions (Signed)
Return to clinic in 2 months

## 2011-09-21 ENCOUNTER — Ambulatory Visit (HOSPITAL_COMMUNITY)
Admission: RE | Admit: 2011-09-21 | Discharge: 2011-09-21 | Disposition: A | Discharge status: Home / Self Care | Payer: Medicare Other | Source: Ambulatory Visit | Attending: Internal Medicine | Admitting: Internal Medicine

## 2011-09-21 DIAGNOSIS — R0602 Shortness of breath: Secondary | ICD-10-CM | POA: Insufficient documentation

## 2011-09-21 MED ORDER — ALBUTEROL SULFATE (5 MG/ML) 0.5% IN NEBU
2.5000 mg | INHALATION_SOLUTION | Freq: Once | RESPIRATORY_TRACT | Status: AC
  Administered 2011-09-21: 2.5 mg via RESPIRATORY_TRACT

## 2011-09-28 ENCOUNTER — Encounter: Payer: Self-pay | Admitting: Internal Medicine

## 2011-09-28 NOTE — Assessment & Plan Note (Signed)
Pressure ok today, but higher than optimal for a diabetic.  Will recheck at follow-up.

## 2011-09-28 NOTE — Assessment & Plan Note (Signed)
Will change glipizide to glipizide XL 10mg  PO daily.  Continue metformin BID.  Last HbA1c was good two months ago.  Since he has not been using insulin since last visit, will discontinue insulin for now and re-assess HbA1c at follow-up in 2 months.

## 2011-09-28 NOTE — Assessment & Plan Note (Signed)
Ativan refilled.

## 2011-09-28 NOTE — Progress Notes (Signed)
Subjective:   Patient ID: Riley Jones male   DOB: December 27, 1955 55 y.o.   MRN: [id number]  HPI: [name] is a 55 y.o. who presents for follow-up.  He reports that his insomnia is much improved with ativan, so long as he tried to go to bed right after he takes the medicine.  He reports that his back pain continues to be a considerable problem.  He had XRay last time showing no evidence of fracture.  He says the Vicodin is helping.      Past Medical History  Diagnosis Date  . Depression     Stable, off meds.  . Type II diabetes mellitus     Well controlled   . Hypertension     Well Controlled off meds  . 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 Sampson Goon ([place])  . Hyperlipidemia   . Rheumatoid arthritis     Followed by Dr. Kellie Simmering, off MTX and prdnisone since 2007  . Atypical angina     Myoview 2003, Normal EF 64%  . Foot pain 03/30/2011   Current Outpatient Prescriptions  Medication Sig Dispense Refill  . albuterol (VENTOLIN HFA) 108 (90 BASE) MCG/ACT inhaler Inhale 1-2 puffs into the lungs every 4 (four) hours as needed. for shortness of breath. Or may use every 6 hours as needed.       Marland Kitchen amLODipine (NORVASC) 10 MG tablet Take 1 tablet (10 mg total) by mouth daily.  31 tablet  11  . aspirin 81 MG EC tablet Take 81 mg by mouth daily.        . cyclobenzaprine (FLEXERIL) 5 MG tablet Take 1 tablet (5 mg total) by mouth 3 (three) times daily as needed for muscle spasms.  60 tablet  1  . efavirenz-emtrictabine-tenofovir (ATRIPLA) 600-200-300 MG per tablet Take 1 tablet by mouth daily.  30 tablet  prn  . FREESTYLE LITE test strip TEST 4 TIMES DAILY AS DIRECTED  100 each  3  . glipiZIDE (GLIPIZIDE XL) 10 MG 24 hr tablet Take 1 tablet (10 mg total) by mouth daily.  30 tablet  3  . Glucerna (GLUCERNA) LIQD Take 1 Can by mouth 2 (two) times daily.  1500 mL  prn  . HYDROcodone-acetaminophen (LORTAB) 7.5-500 MG per tablet Take 1 tablet by  mouth every 6 (six) hours as needed. Prescribed by Dr Aldine Contes handwritten.  30 tablet  0  . HYDROcodone-acetaminophen (VICODIN ES) 7.5-750 MG per tablet TAKE ONE TABLET BY MOUTH EVERY DAY IF NEEDED FOR PAIN.  30 tablet  0  . lisinopril (PRINIVIL,ZESTRIL) 20 MG tablet Take 20 mg by mouth daily.        . meloxicam (MOBIC) 15 MG tablet Take 1 tablet (15 mg total) by mouth daily.  14 tablet  0  . metFORMIN (GLUCOPHAGE) 1000 MG tablet Take 1 tablet (1,000 mg total) by mouth 2 (two) times daily with a meal.  60 tablet  11  . mometasone (NASONEX) 50 MCG/ACT nasal spray 2 sprays daily as needed. In each nostril      . omeprazole (PRILOSEC) 20 MG capsule Take 1 capsule (20 mg total) by mouth daily.  30 capsule  prn  . pravastatin (PRAVACHOL) 40 MG tablet Take 1 tablet (40 mg total) by mouth at bedtime.  90 tablet  4  . terazosin (HYTRIN) 2 MG capsule Take 2 capsules (4 mg total) by mouth at bedtime.  60 capsule  5   No  family history on file. History   Social History  . Marital Status: Single    Spouse Name: N/A    Number of Children: N/A  . Years of Education: N/A   Occupational History  . Retired Naval architect    Social History Main Topics  . Smoking status: Current Everyday Smoker -- 1.0 packs/day    Types: Cigarettes  . Smokeless tobacco: Never Used   Comment: ready to quit, has Medicare now  . Alcohol Use: No  . Drug Use: None  . Sexually Active: No     declined condoms   Other Topics Concern  . None   Social History Narrative   [employer] approved beginning 12/11/2009 - 3/31/2012[employer] White benefits approved; patient eligible for 100% discount for out patient labs and office visits. Patient eligible for 70% discount for other services per Kandice Robinsons 12/12/2011Patient is on disability, and walks 2-3 times per week.    Review of Systems: Constitutional: Denies fever, chills, diaphoresis, appetite change and fatigue.  Respiratory: DOE, cough, chest tightness,  and wheezing. He used  albuterol nightly for SOB.  Cardiovascular: Denies chest pain, palpitations and leg swelling.  Gastrointestinal: Denies nausea, vomiting, abdominal pain, diarrhea, constipation, blood in stool and abdominal distention.  Genitourinary: Denies dysuria, urgency, frequency, hematuria, flank pain and difficulty urinating.  Musculoskeletal: see HPI   Objective:  Physical Exam: Filed Vitals:   09/14/11 1515  BP: 138/73  Pulse: 68  Temp: 97.4 F (36.3 C)  TempSrc: Oral  Height: 5\' 11"  (1.803 m)  Weight: 210 lb 1.6 oz (95.301 kg)   Constitutional: Vital signs reviewed.  Patient is a well-developed and well-nourished male in no acute distress and cooperative with exam. Alert and oriented x3.  Head: Normocephalic and atraumatic Mouth: no erythema or exudates, MMM Eyes: PERRL, EOMI, conjunctivae normal, No scleral icterus.  Neck:  No JVD or carotid bruit present.  Cardiovascular: RRR, S1 normal, S2 normal, no MRG, pulses symmetric and intact bilaterally Pulmonary/Chest: CTAB, no wheezes, rales, or rhonchi  Musculoskeletal: No joint deformities, erythema, or stiffness,  Tenderness to palpation over central thoracic back is unchanged.   Neurological: A&O x3, Strength is normal and symmetric bilaterally, cranial nerve II-XII are grossly intact   Assessment & Plan:

## 2011-09-28 NOTE — Assessment & Plan Note (Signed)
Vicodin was refilled earlier this month and seems to be helping.  Per my attending's assessment/exam, etiology seems to be muscle tension, and patient would likely benefit from physical therapy.  Will refer to physical therapy.  Will also prescribe mobic to see if this helps his pain and can allow him to discontinue narcotic use.  Also recommended that patient find a Land in town.

## 2011-09-28 NOTE — Assessment & Plan Note (Signed)
Does not wish to quit smoking at this time.  Explained that we can help assist if he ever wants to attempt to quit.

## 2011-09-28 NOTE — Assessment & Plan Note (Signed)
Uses albuterol daily.  Will get PFTs

## 2011-09-29 NOTE — Progress Notes (Signed)
I have reviewed and discussed the care of this patient in detail with the resident physician including pertinent patient records, physical exam findings and data. I agree with details of this encounter.   

## 2011-10-05 ENCOUNTER — Encounter: Payer: Self-pay | Admitting: Internal Medicine

## 2011-10-05 ENCOUNTER — Telehealth: Payer: Self-pay | Admitting: *Deleted

## 2011-10-05 ENCOUNTER — Ambulatory Visit (INDEPENDENT_AMBULATORY_CARE_PROVIDER_SITE_OTHER): Payer: Medicare Other | Admitting: Internal Medicine

## 2011-10-05 VITALS — BP 118/73 | HR 79 | Temp 97.3°F | Wt 207.4 lb

## 2011-10-05 DIAGNOSIS — X58XXXA Exposure to other specified factors, initial encounter: Secondary | ICD-10-CM

## 2011-10-05 DIAGNOSIS — R58 Hemorrhage, not elsewhere classified: Secondary | ICD-10-CM

## 2011-10-05 DIAGNOSIS — S30812A Abrasion of penis, initial encounter: Secondary | ICD-10-CM | POA: Insufficient documentation

## 2011-10-05 DIAGNOSIS — IMO0002 Reserved for concepts with insufficient information to code with codable children: Secondary | ICD-10-CM

## 2011-10-05 DIAGNOSIS — E119 Type 2 diabetes mellitus without complications: Secondary | ICD-10-CM

## 2011-10-05 DIAGNOSIS — D699 Hemorrhagic condition, unspecified: Secondary | ICD-10-CM

## 2011-10-05 DIAGNOSIS — N501 Vascular disorders of male genital organs: Secondary | ICD-10-CM

## 2011-10-05 LAB — PROTIME-INR
INR: 0.86 (ref <1.50)
Prothrombin Time: 11.9 seconds (ref 11.6–15.2)

## 2011-10-05 LAB — APTT: aPTT: 28 seconds (ref 24–37)

## 2011-10-05 MED ORDER — GLIPIZIDE ER 10 MG PO TB24: 10.0000 mg | ORAL_TABLET | Freq: Every day | ORAL | Status: DC

## 2011-10-05 NOTE — Assessment & Plan Note (Signed)
Patient has an arc shaped scar across top of head/glans of penis proximally.  Per patient this has been present since at least he was a small child.  Suspects due to aberrent circumcision or goose bite injury.  Has had bleeding over scar in past, including in 2010 when he saw Dr. Comer Locket at this clinic.  Bleeding has resolved itself in past.    Plan: -Check PT, pTT since I cannot find these checked previously and pt gives history of lots of bleeding after tonsil operation.  Check CBC as well. Doubt it will be below baseline based on amount of bleeding so far, but will be good for baseline in case bleeding gets significantly worse. -Porous dressing given.  Change at each shower.  Patient may choose these vs. Bandaids.   -Hold aspirin for two weeks -Urology referral for advice how to prevent future bleeding episodes, especially given possible prospective partner -Call clinic in 3 days if bleeding has not stopped.  Call clinic immediately if bleeding worsens. -Return to clinic in 1 month otherwise

## 2011-10-05 NOTE — Patient Instructions (Signed)
Return to clinic in 1 month

## 2011-10-05 NOTE — Telephone Encounter (Signed)
As discussed

## 2011-10-05 NOTE — Telephone Encounter (Signed)
Pt calls and states he is having a problem that has happened at least once before, his penis is bleeding at the circumcision site, he states there is a scar where it opens and bleeds, has been bleeding for appr 2 days. Pt stated he could be here by 0945, will await his arrival, spoke w/ drs woodyear and wainright

## 2011-10-05 NOTE — Progress Notes (Signed)
I agree with Dr. Wainwright's assessment and plan. 

## 2011-10-05 NOTE — Progress Notes (Signed)
Subjective:   Patient ID: Riley Jones male   DOB: 10/23/1955 56 y.o.   MRN: [id number]  HPI: RileyAviel JAMONI Jones is a 56 y.o. with history of well-controlled HIV, HTN, Type 2 DM, HLD who presents with bleeding on glans of penis over a lifelong scar he has.  Bleeding started last week then stopped.  Now restarted today while toweling himself off gently after shower.  Per patient bleeding was strong enough that it squirted with his heartbeat.  Like a pinhole.  Has had repeated episodes like this over the years.  No pain except when removing band aid.  He has a tranverse scar over head of penis on the proximal top of the glans.  This is lifelong scar.    Past Medical History  Diagnosis Date  . Depression     Stable, off meds.  . Type II diabetes mellitus     Well controlled   . Hypertension     Well Controlled off meds  . 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 Sampson Goon ([facility])  . Hyperlipidemia   . Rheumatoid arthritis     Followed by Dr. Kellie Simmering, off MTX and prdnisone since 2007  . Atypical angina     Myoview 2003, Normal EF 64%  . Foot pain 03/30/2011   Current Outpatient Prescriptions  Medication Sig Dispense Refill  . albuterol (VENTOLIN HFA) 108 (90 BASE) MCG/ACT inhaler Inhale 1-2 puffs into the lungs every 4 (four) hours as needed. for shortness of breath. Or may use every 6 hours as needed.       Marland Kitchen amLODipine (NORVASC) 10 MG tablet Take 1 tablet (10 mg total) by mouth daily.  31 tablet  11  . aspirin 81 MG EC tablet Take 81 mg by mouth daily.        . cyclobenzaprine (FLEXERIL) 5 MG tablet Take 1 tablet (5 mg total) by mouth 3 (three) times daily as needed for muscle spasms.  60 tablet  1  . efavirenz-emtrictabine-tenofovir (ATRIPLA) 600-200-300 MG per tablet Take 1 tablet by mouth daily.  30 tablet  prn  . FREESTYLE LITE test strip TEST 4 TIMES DAILY AS DIRECTED  100 each  3  . glipiZIDE (GLIPIZIDE XL) 10 MG 24 hr tablet Take 1  tablet (10 mg total) by mouth daily.  30 tablet  3  . Glucerna (GLUCERNA) LIQD Take 1 Can by mouth 2 (two) times daily.  1500 mL  prn  . HYDROcodone-acetaminophen (LORTAB) 7.5-500 MG per tablet Take 1 tablet by mouth every 6 (six) hours as needed. Prescribed by Dr Aldine Contes handwritten.  30 tablet  0  . HYDROcodone-acetaminophen (VICODIN ES) 7.5-750 MG per tablet TAKE ONE TABLET BY MOUTH EVERY DAY IF NEEDED FOR PAIN.  30 tablet  0  . lisinopril (PRINIVIL,ZESTRIL) 20 MG tablet Take 20 mg by mouth daily.        . meloxicam (MOBIC) 15 MG tablet Take 1 tablet (15 mg total) by mouth daily.  14 tablet  0  . metFORMIN (GLUCOPHAGE) 1000 MG tablet Take 1 tablet (1,000 mg total) by mouth 2 (two) times daily with a meal.  60 tablet  11  . mometasone (NASONEX) 50 MCG/ACT nasal spray 2 sprays daily as needed. In each nostril      . omeprazole (PRILOSEC) 20 MG capsule Take 1 capsule (20 mg total) by mouth daily.  30 capsule  prn  . pravastatin (PRAVACHOL) 40 MG tablet Take 1 tablet (40  mg total) by mouth at bedtime.  90 tablet  4  . terazosin (HYTRIN) 2 MG capsule Take 2 capsules (4 mg total) by mouth at bedtime.  60 capsule  5   No family history on file. History   Social History  . Marital Status: Single    Spouse Name: N/A    Number of Children: N/A  . Years of Education: N/A   Occupational History  . Retired Naval architect    Social History Main Topics  . Smoking status: Current Everyday Smoker -- 1.0 packs/day    Types: Cigarettes  . Smokeless tobacco: Never Used   Comment: ready to quit, has Medicare now  . Alcohol Use: No  . Drug Use: None  . Sexually Active: No     declined condoms   Other Topics Concern  . None   Social History Narrative   [employer] approved beginning 12/11/2009 - 3/31/2012Ryan White benefits approved; patient eligible for 100% discount for out patient labs and office visits. Patient eligible for 70% discount for other services per Kandice Robinsons 12/12/2011Patient is on  disability, and walks 2-3 times per week.    Review of Systems: Constitutional: Denies fever, chills, diaphoresis, appetite change and fatigue. He is a little lightheaded. Respiratory: Denies SOB, DOE, cough, chest tightness,  and wheezing.   Cardiovascular: Denies chest pain, palpitations and leg swelling.   Objective:  Physical Exam: Filed Vitals:   10/05/11 1337  BP: 118/73  Pulse: 79  Temp: 97.3 F (36.3 C)  TempSrc: Oral  Weight: 207 lb 6.4 oz (94.076 kg)   Constitutional: Vital signs reviewed.  Patient is a well-developed and well-nourished male in no acute distress and cooperative with exam. Alert and oriented x3.   GU: He has a tranverse slice scar over head of penis on the proximal top of the glans. Bleeding spot is very small pinhole like spot on left edge of scar where internal edge transitions to surface of glans.  Small amount of blood on bandaid.  No definite active bleeding.  Rest of glans appears well perfused.   Assessment & Plan:

## 2011-10-06 ENCOUNTER — Ambulatory Visit: Payer: Medicare Other | Admitting: Internal Medicine

## 2011-10-07 ENCOUNTER — Other Ambulatory Visit (INDEPENDENT_AMBULATORY_CARE_PROVIDER_SITE_OTHER): Payer: Medicare Other

## 2011-10-07 ENCOUNTER — Other Ambulatory Visit: Payer: Self-pay | Admitting: Infectious Diseases

## 2011-10-07 ENCOUNTER — Ambulatory Visit: Payer: Medicare Other

## 2011-10-07 DIAGNOSIS — Z79899 Other long term (current) drug therapy: Secondary | ICD-10-CM

## 2011-10-07 DIAGNOSIS — Z113 Encounter for screening for infections with a predominantly sexual mode of transmission: Secondary | ICD-10-CM

## 2011-10-07 DIAGNOSIS — B20 Human immunodeficiency virus [HIV] disease: Secondary | ICD-10-CM

## 2011-10-08 LAB — GC/CHLAMYDIA PROBE AMP, URINE: Chlamydia, Swab/Urine, PCR: NEGATIVE

## 2011-10-08 LAB — CBC WITH DIFFERENTIAL/PLATELET
Eosinophils Absolute: 0.2 10*3/uL (ref 0.0–0.7)
Hemoglobin: 16 g/dL (ref 13.0–17.0)
Lymphocytes Relative: 26 % (ref 12–46)
Lymphs Abs: 2.3 10*3/uL (ref 0.7–4.0)
MCH: 32.1 pg (ref 26.0–34.0)
Monocytes Relative: 8 % (ref 3–12)
Neutro Abs: 5.7 10*3/uL (ref 1.7–7.7)
Neutrophils Relative %: 64 % (ref 43–77)
Platelets: 171 10*3/uL (ref 150–400)
RBC: 4.99 MIL/uL (ref 4.22–5.81)
WBC: 9 10*3/uL (ref 4.0–10.5)

## 2011-10-08 LAB — LIPID PANEL
Cholesterol: 189 mg/dL (ref 0–200)
LDL Cholesterol: 104 mg/dL — ABNORMAL HIGH (ref 0–99)
Total CHOL/HDL Ratio: 4.8 Ratio
Triglycerides: 230 mg/dL — ABNORMAL HIGH (ref <150)
VLDL: 46 mg/dL — ABNORMAL HIGH (ref 0–40)

## 2011-10-08 LAB — COMPLETE METABOLIC PANEL WITH GFR
ALT: 55 U/L — ABNORMAL HIGH (ref 0–53)
Albumin: 4.6 g/dL (ref 3.5–5.2)
CO2: 23 mEq/L (ref 19–32)
GFR, Est African American: >89 mL/min
GFR, Est Non African American: >89 mL/min
Glucose, Bld: 202 mg/dL — ABNORMAL HIGH (ref 70–99)
Potassium: 4.5 mEq/L (ref 3.5–5.3)
Sodium: 136 mEq/L (ref 135–145)
Total Bilirubin: 0.5 mg/dL (ref 0.3–1.2)
Total Protein: 7.3 g/dL (ref 6.0–8.3)

## 2011-10-08 LAB — URINALYSIS, ROUTINE W REFLEX MICROSCOPIC
Nitrite: NEGATIVE
Specific Gravity, Urine: 1.025 (ref 1.005–1.030)
Urobilinogen, UA: 1 mg/dL (ref 0.0–1.0)
pH: 6.5 (ref 5.0–8.0)

## 2011-10-08 LAB — MICROALBUMIN / CREATININE URINE RATIO
Creatinine, Urine: 144.3 mg/dL
Microalb Creat Ratio: 12.8 mg/g (ref 0.0–30.0)

## 2011-10-08 LAB — T-HELPER CELL (CD4) - (RCID CLINIC ONLY)
CD4 % Helper T Cell: 41 % (ref 33–55)
CD4 T Cell Abs: 900 uL (ref 400–2700)

## 2011-10-08 LAB — RPR

## 2011-10-12 ENCOUNTER — Other Ambulatory Visit: Payer: Self-pay | Admitting: *Deleted

## 2011-10-12 MED ORDER — GLUCOSE BLOOD VI STRP: ORAL_STRIP | Status: DC

## 2011-10-13 ENCOUNTER — Other Ambulatory Visit: Payer: Self-pay | Admitting: *Deleted

## 2011-10-13 MED ORDER — GLUCOSE BLOOD VI STRP: 1.0000 | ORAL_STRIP | Freq: Two times a day (BID) | Status: DC

## 2011-10-13 NOTE — Telephone Encounter (Signed)
Need new rx to include dx code  And specific # of times per day for test strips, in order to be covered by pt's insurance.

## 2011-10-21 ENCOUNTER — Ambulatory Visit (INDEPENDENT_AMBULATORY_CARE_PROVIDER_SITE_OTHER): Payer: Medicare Other | Admitting: Infectious Diseases

## 2011-10-21 ENCOUNTER — Encounter: Payer: Self-pay | Admitting: Infectious Diseases

## 2011-10-21 VITALS — BP 142/83 | HR 63 | Temp 97.6°F | Ht 70.0 in | Wt 206.1 lb

## 2011-10-21 DIAGNOSIS — B20 Human immunodeficiency virus [HIV] disease: Secondary | ICD-10-CM

## 2011-10-21 DIAGNOSIS — G4700 Insomnia, unspecified: Secondary | ICD-10-CM

## 2011-10-21 DIAGNOSIS — E119 Type 2 diabetes mellitus without complications: Secondary | ICD-10-CM

## 2011-10-21 DIAGNOSIS — N529 Male erectile dysfunction, unspecified: Secondary | ICD-10-CM

## 2011-10-21 DIAGNOSIS — E785 Hyperlipidemia, unspecified: Secondary | ICD-10-CM

## 2011-10-21 DIAGNOSIS — F17200 Nicotine dependence, unspecified, uncomplicated: Secondary | ICD-10-CM

## 2011-10-21 DIAGNOSIS — E291 Testicular hypofunction: Secondary | ICD-10-CM

## 2011-10-21 MED ORDER — TESTOSTERONE 4 MG/24HR TD PT24: 1.0000 | MEDICATED_PATCH | Freq: Every day | TRANSDERMAL | Status: DC

## 2011-10-21 MED ORDER — SILDENAFIL CITRATE 25 MG PO TABS: 25.0000 mg | ORAL_TABLET | Freq: Every day | ORAL | Status: DC | PRN

## 2011-10-21 MED ORDER — ALPRAZOLAM 0.5 MG PO TABS: 0.5000 mg | ORAL_TABLET | Freq: Every evening | ORAL | Status: DC | PRN

## 2011-10-21 NOTE — Progress Notes (Signed)
  Subjective:    Patient ID: Riley Jones, male    DOB: 01/05/1956, 56 y.o.   MRN: [id number]  HPI 56 yo M with hx of HIV+ (~36yrs), DM2 (>58yrs) and RA. He has been maintained on atripla (has been on multiple previous meds). His last CD4 was 900 and VL <20, GLc 202 (10-07-11). C/o pain from his neuropathy and his arthritis.     Review of Systems  Constitutional: Positive for appetite change. Negative for unexpected weight change.       Poor apetite  Eyes: Negative for visual disturbance.  Respiratory: Negative for shortness of breath.   Cardiovascular: Negative for chest pain.  Gastrointestinal: Negative for diarrhea and constipation.  Genitourinary: Negative for dysuria.  Musculoskeletal: Positive for arthralgias.  Neurological: Positive for numbness.   Has penile scar and has had some bleeding. Seeing urology for this. Saw ophtho last summer. Has easy bleeding (due to baby ASA?)    Objective:   Physical Exam  Constitutional: He appears well-developed and well-nourished.  HENT:  Head: Normocephalic.  Mouth/Throat: No oropharyngeal exudate.  Eyes: EOM are normal. Pupils are equal, round, and reactive to light.  Neck: Normal range of motion.  Cardiovascular: Normal rate, regular rhythm and normal heart sounds.   Pulmonary/Chest: Effort normal and breath sounds normal. No respiratory distress. He has no wheezes. He has no rales.  Abdominal: Soft. Bowel sounds are normal. There is no tenderness. There is no rebound.  Musculoskeletal: He exhibits edema.  Lymphadenopathy:    He has no cervical adenopathy.  Neurological: No sensory deficit.  Skin: Skin is warm and dry. No lesion noted.             Assessment & Plan:

## 2011-10-21 NOTE — Assessment & Plan Note (Signed)
His last testosterone level was low. States that the patch previously gave him local skin irritation. Will try again, if he has problems will try either injections or different topical formulation.

## 2011-10-21 NOTE — Assessment & Plan Note (Signed)
Will refill his benzo

## 2011-10-21 NOTE — Assessment & Plan Note (Addendum)
He is doing very well. He is given condoms. vax uptodate. See him back in 4-6 months.

## 2011-10-21 NOTE — Assessment & Plan Note (Signed)
Encouraged to stop smoking

## 2011-10-21 NOTE — Assessment & Plan Note (Addendum)
Will recheck his lipids at f/u visit. His testosterone supplement may worsen this.

## 2011-10-21 NOTE — Assessment & Plan Note (Signed)
He is doing ok. My great appreciation to IM for partnering with [place].

## 2011-10-21 NOTE — Assessment & Plan Note (Signed)
Will refill his prev rx for viagra. He denies CP or SOB. he is given condoms.

## 2011-10-28 ENCOUNTER — Other Ambulatory Visit: Payer: Self-pay | Admitting: Internal Medicine

## 2011-10-28 NOTE — Telephone Encounter (Signed)
When was this last filled?  Can the refill wait for Dr. Yaakov Guthrie?  Why is it listed twice on his med list?  Did he keep app't with PT?

## 2011-10-28 NOTE — Telephone Encounter (Signed)
Last filled : 09/30/2011 PT appt is not until 11/03/2011 It was sent to dr Yaakov Guthrie as well as the attending

## 2011-10-29 NOTE — Telephone Encounter (Signed)
Refill for Vicodin was called to the [facility].  Angelina Ok, RN 10/29/2011 1:36 PM.

## 2011-10-29 NOTE — Telephone Encounter (Signed)
Please call in.  Next refill must be by his PCP and a FYI is needed.

## 2011-10-30 ENCOUNTER — Other Ambulatory Visit: Payer: Self-pay | Admitting: *Deleted

## 2011-10-30 MED ORDER — LISINOPRIL 20 MG PO TABS: 20.0000 mg | ORAL_TABLET | Freq: Every day | ORAL | Status: DC

## 2011-11-03 ENCOUNTER — Ambulatory Visit: Payer: Medicare Other | Attending: Internal Medicine

## 2011-11-03 DIAGNOSIS — M62838 Other muscle spasm: Secondary | ICD-10-CM | POA: Insufficient documentation

## 2011-11-03 DIAGNOSIS — M546 Pain in thoracic spine: Secondary | ICD-10-CM | POA: Insufficient documentation

## 2011-11-03 DIAGNOSIS — IMO0001 Reserved for inherently not codable concepts without codable children: Secondary | ICD-10-CM | POA: Insufficient documentation

## 2011-11-05 ENCOUNTER — Encounter: Payer: Self-pay | Admitting: Internal Medicine

## 2011-11-05 ENCOUNTER — Ambulatory Visit (INDEPENDENT_AMBULATORY_CARE_PROVIDER_SITE_OTHER): Payer: Medicare Other | Admitting: Internal Medicine

## 2011-11-05 VITALS — BP 160/89 | HR 71 | Temp 96.9°F | Wt 208.5 lb

## 2011-11-05 DIAGNOSIS — G8929 Other chronic pain: Secondary | ICD-10-CM

## 2011-11-05 DIAGNOSIS — M25519 Pain in unspecified shoulder: Secondary | ICD-10-CM

## 2011-11-05 DIAGNOSIS — R222 Localized swelling, mass and lump, trunk: Secondary | ICD-10-CM

## 2011-11-05 DIAGNOSIS — M549 Dorsalgia, unspecified: Secondary | ICD-10-CM

## 2011-11-05 DIAGNOSIS — B20 Human immunodeficiency virus [HIV] disease: Secondary | ICD-10-CM

## 2011-11-05 DIAGNOSIS — I10 Essential (primary) hypertension: Secondary | ICD-10-CM

## 2011-11-05 DIAGNOSIS — E119 Type 2 diabetes mellitus without complications: Secondary | ICD-10-CM

## 2011-11-05 LAB — POCT GLYCOSYLATED HEMOGLOBIN (HGB A1C): Hemoglobin A1C: 7.9

## 2011-11-05 LAB — GLUCOSE, CAPILLARY: Glucose-Capillary: 245 mg/dL — ABNORMAL HIGH (ref 70–99)

## 2011-11-05 MED ORDER — INSULIN GLARGINE 100 UNIT/ML ~~LOC~~ SOLN: 5.0000 [IU] | Freq: Every day | SUBCUTANEOUS | Status: DC

## 2011-11-05 MED ORDER — INSULIN PEN NEEDLE 31G X 5 MM MISC: Status: DC

## 2011-11-05 MED ORDER — HYDROCHLOROTHIAZIDE 12.5 MG PO CAPS: 12.5000 mg | ORAL_CAPSULE | Freq: Every day | ORAL | Status: DC

## 2011-11-05 NOTE — Patient Instructions (Signed)
1. Please measure your blood glucose level and bring in your record in your next visit 2. Please take all medications as prescribed.  3. If you have worsening of your symptoms or new symptoms arise, please call the clinic ([phone]), or go to the ER immediately if symptoms are severe.

## 2011-11-05 NOTE — Progress Notes (Addendum)
Subjective:   Patient ID: Riley Jones male   DOB: 06-30-1956 56 y.o.   MRN: [id number]  HPI:  [name] is a 56 y.o. with history of HIV, HTN, Type 2 DM, HLD, who presents for a regular follow up visit. Patient wants to discuss several issues today.   First is about his chronic back pain and right should pain. He started doing PT and did once yesterday. He was proscribed with Mobic in his last visit. But he did not take it because that he thinks it is only pain and he did not know it also has anti-inflammatory effect. He still have similar level of pain and is hoping PT will eventually help him.  Second, he reports that he had singles in the past. Last week he had rashes again in his right waist. After rashes resolved, he has pain in the same area in a band-like pattern. The pain is moderate now and tolerable. He asks whether he need to have anti-viral treatment.  Third, his blood pressure is still high and wants to know what needs to be done for his HTN.  Fourth, he wants to know how is his DM-II controlled. In fact, today his A1c is 7.9 which is worse than last visit on 06/28/12 (A1c was 7.0), he reports that he had several episodes of hypoglycemia in last 1 or 2 months. So Glipizide dose was decreased from  10 mg twice daily to once daily. He has polyuria, but no dysuria.  Fifth, he noticed small mass in his left chest above his breast.   Denies fever, chills, fatigue, headaches,  cough, chest pain, SOB,  abdominal pain,diarrhea, constipation, dysuria, urgency, frequency, or leg swelling.   Past Medical History  Diagnosis Date  . Depression     Stable, off meds.  . Type II diabetes mellitus     Well controlled   . Hypertension     Well Controlled off meds  . 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 Sampson Jones ([facility])  . Hyperlipidemia   . Rheumatoid arthritis     Followed by Dr. Kellie Jones, off MTX and prdnisone since 2007  .  Atypical angina     Myoview 2003, Normal EF 64%  . Foot pain 03/30/2011   Current Outpatient Prescriptions  Medication Sig Dispense Refill  . albuterol (VENTOLIN HFA) 108 (90 BASE) MCG/ACT inhaler Inhale 1-2 puffs into the lungs every 4 (four) hours as needed. for shortness of breath. Or may use every 6 hours as needed.       . ALPRAZolam (XANAX) 0.5 MG tablet Take 1 tablet (0.5 mg total) by mouth at bedtime as needed. Per Dr. Aldine Jones  30 tablet  1  . amLODipine (NORVASC) 10 MG tablet Take 1 tablet (10 mg total) by mouth daily.  31 tablet  11  . cyclobenzaprine (FLEXERIL) 5 MG tablet Take 1 tablet (5 mg total) by mouth 3 (three) times daily as needed for muscle spasms.  60 tablet  1  . efavirenz-emtrictabine-tenofovir (ATRIPLA) 600-200-300 MG per tablet Take 1 tablet by mouth daily.  30 tablet  prn  . glipiZIDE (GLIPIZIDE XL) 10 MG 24 hr tablet Take 1 tablet (10 mg total) by mouth daily.  30 tablet  3  . Glucerna (GLUCERNA) LIQD Take 1 Can by mouth 2 (two) times daily.  1500 mL  prn  . glucose blood (FREESTYLE LITE) test strip 1 each by Other route 2 (two) times daily. Dx  code 250.00  100 each  5  . HYDROcodone-acetaminophen (VICODIN ES) 7.5-750 MG per tablet TAKE ONE TABLET BY MOUTH EVERY DAY IF NEEDED FOR PAIN  30 tablet  0  . lisinopril (PRINIVIL,ZESTRIL) 20 MG tablet Take 1 tablet (20 mg total) by mouth daily.  31 tablet  4  . meloxicam (MOBIC) 15 MG tablet Take 1 tablet (15 mg total) by mouth daily.  14 tablet  0  . metFORMIN (GLUCOPHAGE) 1000 MG tablet Take 1 tablet (1,000 mg total) by mouth 2 (two) times daily with a meal.  60 tablet  11  . mometasone (NASONEX) 50 MCG/ACT nasal spray 2 sprays daily as needed. In each nostril      . omeprazole (PRILOSEC) 20 MG capsule Take 1 capsule (20 mg total) by mouth daily.  30 capsule  prn  . pravastatin (PRAVACHOL) 40 MG tablet Take 1 tablet (40 mg total) by mouth at bedtime.  90 tablet  4  . sildenafil (VIAGRA) 25 MG tablet Take 1 tablet (25 mg total)  by mouth daily as needed for erectile dysfunction (please call MD if erection lasts >8 hours. do not use more than 1 every 48 hours. ).  10 tablet  0  . terazosin (HYTRIN) 2 MG capsule Take 2 capsules (4 mg total) by mouth at bedtime.  60 capsule  5  . testosterone (ANDRODERM) 4 MG/24HR PT24 patch Place 1 patch onto the skin daily.  30 patch  3  . aspirin 81 MG EC tablet Take 81 mg by mouth daily.        . hydrochlorothiazide (MICROZIDE) 12.5 MG capsule Take 1 capsule (12.5 mg total) by mouth daily.  30 capsule  11  . insulin glargine (LANTUS SOLOSTAR) 100 UNIT/ML injection Inject 5 Units into the skin at bedtime.  10 mL  6  . Insulin Pen Needle 31G X 5 MM MISC Use it for insulin injection  100 each  11   Family History  Problem Relation Age of Onset  . Osteoporosis Mother   . Heart disease Father    History   Social History  . Marital Status: Single    Spouse Name: N/A    Number of Children: N/A  . Years of Education: N/A   Occupational History  . Retired Naval architect    Social History Main Topics  . Smoking status: Current Everyday Smoker -- 1.0 packs/day    Types: Cigarettes  . Smokeless tobacco: Never Used   Comment: ready to quit, has Medicare now  . Alcohol Use: 0.6 oz/week    1 Glasses of wine per week     rare wine  . Drug Use: No  . Sexually Active: No     accepted condoms   Other Topics Concern  . Not on file   Social History Narrative   [employer] approved beginning 12/11/2009 - 3/31/2012Ryan White benefits approved; patient eligible for 100% discount for out patient labs and office visits. Patient eligible for 70% discount for other services per Riley Jones 12/12/2011Patient is on disability, and walks 2-3 times per week.    Review of Systems:  General: no fevers, chills, no changes in body weight, no changes in appetite Skin: no rash on his waist. HEENT: no blurry vision, hearing changes or sore throat Pulm: no dyspnea, coughing, wheezing CV: no chest pain,  palpitations, shortness of breath Abd: no nausea/vomiting, abdominal pain, diarrhea/constipation GU: no dysuria, hematuria, has polyuria Ext: pain in his right shoulder and back. Neuro: no weakness, numbness,  or tingling   Objective:  Physical Exam: Filed Vitals:   11/05/11 0902  BP: 160/89  Pulse: 71  Temp: 96.9 F (36.1 C)  TempSrc: Oral  Weight: 208 lb 8 oz (94.575 kg)    General: not in acute distress HEENT: PERRL, EOMI, no scleral icterus Cardiac: S1/S2, RRR, No murmurs, gallops or rubs Pulm: Good air movement bilaterally, Clear to auscultation bilaterally, No rales, wheezing, rhonchi or rubs. Chest wall: there is a small (0.5 cm) mass on the left chest wall. Hard, but very movable. Clear margin.  Abd: Soft,  nondistended, nontender, no rebound pain, no organomegaly, BS present Ext: No rashes or edema, 2+DP/PT pulse bilaterally. Has right shoulder pain. Neuro: alert and oriented X3, cranial nerves II-XII grossly intact, muscle strength 5/5 in all extremeties,  sensation to light touch intact.    Assessment & Plan:   #. Chronic back pain and shoulder pain: X-ray done on 08/03/11 showed Chronic mild lower thoracic disc space narrowing and endplate spurring. Patient is doing PT. Will continue Mobic and follow up his response to PT.  # pain in waist after singles: now his pain is tolerable. 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 Will discuss with Dr. Ninetta Lights whether he needs to be treated with anti-viral medications.  # HTN: his bp is not well controlled. bp is 160/89. He is on amlodipine and lisinopril now. Will add on HCTZ and follow up.  #. DM-II: not well controlled. A1C is 7.9 today. Due to his past episodes of hypoglycemia, it is not appropriate to increase glipizide dose. Will start Lautus 5 U daily. Will re-evaluate in one month and make an adjustment of lantus dose.  #  Mass on left chest wall: it is small (0.5 cm in diameter). Although it is hard, yet very movable.  Margin is very clear. Will observe for 6 month and re-evaluate.  # HIV: well controlled. His CD4 was 900 and VL was <20 on 10/07/11. He is followed up regularly by Dr. Court Joy, Brien Few      Addendum:   pain in waist after singles: His pain is tolerable. 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 I discussed with ID attending, Dr. Daiva Eves,  Who thinks here is no need to treat patient will anti-viral medications currently. I called patient and explained the this decision to him.  Lorretta Harp

## 2011-11-06 NOTE — Progress Notes (Signed)
I saw and discussed patient with resident Dr. Clyde Lundborg, and I agree with the plans as outlined in his note, with the following additional comments.  The small nodule on the left chest wall may be a small subcutaneous cyst; would reassess at next visit; if it persists or enlarges, then referral for excision would be appropriate.

## 2011-11-13 ENCOUNTER — Ambulatory Visit: Payer: Medicare Other

## 2011-11-17 ENCOUNTER — Ambulatory Visit: Payer: Medicare Other

## 2011-11-19 ENCOUNTER — Ambulatory Visit: Payer: Medicare Other

## 2011-11-29 ENCOUNTER — Other Ambulatory Visit: Payer: Self-pay | Admitting: Internal Medicine

## 2011-12-03 NOTE — Telephone Encounter (Signed)
Phone to pharmacy  

## 2011-12-04 ENCOUNTER — Other Ambulatory Visit: Payer: Self-pay | Admitting: Internal Medicine

## 2011-12-04 NOTE — Telephone Encounter (Signed)
Please refuse med - called into same pharmacy 12/03/11  On same med. Pharmacy to again due to error.

## 2011-12-04 NOTE — Telephone Encounter (Signed)
Error on pharmacy - filled 12/03/11.

## 2011-12-14 ENCOUNTER — Encounter: Payer: Self-pay | Admitting: Internal Medicine

## 2011-12-14 ENCOUNTER — Ambulatory Visit (INDEPENDENT_AMBULATORY_CARE_PROVIDER_SITE_OTHER): Payer: Medicare Other | Admitting: Internal Medicine

## 2011-12-14 VITALS — BP 126/76 | HR 70 | Temp 96.9°F | Ht 69.0 in | Wt 208.9 lb

## 2011-12-14 DIAGNOSIS — F17200 Nicotine dependence, unspecified, uncomplicated: Secondary | ICD-10-CM

## 2011-12-14 DIAGNOSIS — M549 Dorsalgia, unspecified: Secondary | ICD-10-CM

## 2011-12-14 DIAGNOSIS — H60399 Other infective otitis externa, unspecified ear: Secondary | ICD-10-CM

## 2011-12-14 DIAGNOSIS — F329 Major depressive disorder, single episode, unspecified: Secondary | ICD-10-CM

## 2011-12-14 DIAGNOSIS — E119 Type 2 diabetes mellitus without complications: Secondary | ICD-10-CM

## 2011-12-14 DIAGNOSIS — I10 Essential (primary) hypertension: Secondary | ICD-10-CM

## 2011-12-14 DIAGNOSIS — F3289 Other specified depressive episodes: Secondary | ICD-10-CM

## 2011-12-14 DIAGNOSIS — H6090 Unspecified otitis externa, unspecified ear: Secondary | ICD-10-CM

## 2011-12-14 MED ORDER — INSULIN GLARGINE 100 UNIT/ML ~~LOC~~ SOLN: 10.0000 [IU] | Freq: Every day | SUBCUTANEOUS | Status: DC

## 2011-12-14 MED ORDER — SERTRALINE HCL 50 MG PO TABS: 50.0000 mg | ORAL_TABLET | Freq: Every day | ORAL | Status: DC

## 2011-12-14 MED ORDER — NEOMYCIN-POLYMYXIN-HC 3.5-10000-1 OT SOLN: 4.0000 [drp] | Freq: Three times a day (TID) | OTIC | Status: AC

## 2011-12-14 NOTE — Patient Instructions (Signed)
Please return to clinic in 3-4 weeks.  Take your blood sugars twice per day until then, once before a meal and once 2 hrs after a meal. Please restart aspirin Please increase lantus dose to 10 units every night Please start taking Zoloft (sertraline) 50mg  by mouth daily Please use ear drops 4 drops three times per day in L ear for 7 days

## 2011-12-15 LAB — BASIC METABOLIC PANEL WITH GFR
Calcium: 10 mg/dL (ref 8.4–10.5)
GFR, Est African American: >89 mL/min
GFR, Est Non African American: >89 mL/min
Glucose, Bld: 303 mg/dL — ABNORMAL HIGH (ref 70–99)
Sodium: 138 mEq/L (ref 135–145)

## 2011-12-15 LAB — TSH: TSH: 1.242 u[IU]/mL (ref 0.350–4.500)

## 2011-12-28 ENCOUNTER — Other Ambulatory Visit: Payer: Self-pay | Admitting: Family Medicine

## 2011-12-28 ENCOUNTER — Telehealth: Payer: Self-pay | Admitting: *Deleted

## 2011-12-28 NOTE — Telephone Encounter (Signed)
Patient came by the clinic today to see if he could have a Glucerna Rx refilled. Advised him that since his BMI is 29 will have to ask the provider permission.

## 2011-12-31 NOTE — Assessment & Plan Note (Signed)
Will prescribe Cortisporin ear drops x 7 days.

## 2011-12-31 NOTE — Assessment & Plan Note (Signed)
Will increase Lantus to 10 units nightly.  Will discontinue Glipizide.  Continue metformin.

## 2011-12-31 NOTE — Assessment & Plan Note (Signed)
My pressure for him 126/76 today

## 2011-12-31 NOTE — Assessment & Plan Note (Signed)
Zoloft has worked before for him.  Will restart this

## 2011-12-31 NOTE — Assessment & Plan Note (Signed)
Continue Vicodin, he does not use it very frequently just a few times per week

## 2011-12-31 NOTE — Assessment & Plan Note (Signed)
Not ready to quit at this time.

## 2011-12-31 NOTE — Progress Notes (Signed)
Mr. Ruderman is a 56 yo with well controlled HIV, DM, HTN, back pain here for follow-up.  He's been taking his BP meds.  For diabetes he's been taking Lantus 5 units nightly, Metformin 1000 mg BID, and glipizideXL 10mg  daily.  Mostly every sugar he has checked has been high, most over 200.  He checks 1-2x per day.  Sugar log from meter download concurs, most 200-260.   He has new left ear pain for a couple weeks..  It is burning and constant.  No fevers or chills.  No light sensitivity.  For his thoracic back pain, he went to PT.  The exercises they gave did not help much.  He takes vicodin about two times per week.    He also reports depressed mood.  He enjoys many activities just feels tired a lot.  He last saw a psychiatrist over a year ago.     REVIEW OF SYSTEMS:. ROS: General: no fevers, chills, changes in weight, changes in appetite Skin: no rash HEENT: no blurry vision, hearing changes, sore throat Pulm: no dyspnea, coughing, wheezing CV: no chest pain, palpitations, shortness of breath Abd: no abdominal pain, nausea/vomiting, diarrhea/constipation GU: no dysuria, hematuria, polyuria Ext: no arthralgias, myalgias Neuro: no weakness, numbness, or tingling  PHYSICAL EXAM: Filed Vitals:   12/14/11 1329  BP: 126/76  Pulse: 70  Temp: 96.9 F (36.1 C)    General: alert, well-developed, and cooperative to examination.  Head: normocephalic and atraumatic.  Eyes: vision grossly intact, pupils equal, pupils round, pupils reactive to light, no injection and anicteric.  Mouth: pharynx pink and moist, no erythema, and no exudates.  Neck: no JVD and no carotid bruits.  Lungs: normal respiratory effort, no accessory muscle use, normal breath sounds, no crackles, and no wheezes. Heart: normal rate, regular rhythm, no murmur, no gallop, and no rub.  Abdomen: soft, non-tender, normal bowel sounds, no distention, no guarding, no rebound tenderness, no hepatomegaly, and no splenomegaly.      Pulses: 2+ DP/PT pulses bilaterally Extremities: No cyanosis, clubbing, edema Neurologic: alert & oriented X3, cranial nerves II-XII intact, strength normal in all extremities, sensation intact to light touch Skin: turgor normal and no rashes.

## 2012-01-18 ENCOUNTER — Encounter: Payer: Self-pay | Admitting: Internal Medicine

## 2012-01-18 ENCOUNTER — Ambulatory Visit (INDEPENDENT_AMBULATORY_CARE_PROVIDER_SITE_OTHER): Payer: Medicare Other | Admitting: Internal Medicine

## 2012-01-18 VITALS — BP 121/82 | HR 76 | Temp 97.5°F | Ht 70.0 in | Wt 206.2 lb

## 2012-01-18 DIAGNOSIS — E119 Type 2 diabetes mellitus without complications: Secondary | ICD-10-CM

## 2012-01-18 MED ORDER — LISINOPRIL 20 MG PO TABS: 20.0000 mg | ORAL_TABLET | Freq: Every day | ORAL | Status: DC

## 2012-01-18 MED ORDER — INSULIN NPH (HUMAN) (ISOPHANE) 100 UNIT/ML ~~LOC~~ SUSP: 12.0000 [IU] | Freq: Every day | SUBCUTANEOUS | Status: DC

## 2012-01-18 MED ORDER — ALPRAZOLAM 0.5 MG PO TABS: 0.5000 mg | ORAL_TABLET | Freq: Every evening | ORAL | Status: AC | PRN

## 2012-01-18 NOTE — Patient Instructions (Signed)
Please meet with our diabetes educator later this week if possible.  For two days prior to meeting with her check your sugars 4x per day, otherwise check sugars 2x per day.  Remember to bring your meter to your visit with her. Please return to clinic for diabetes check in 3 weeks.

## 2012-01-25 ENCOUNTER — Encounter: Payer: Self-pay | Admitting: Dietician

## 2012-01-25 ENCOUNTER — Ambulatory Visit (INDEPENDENT_AMBULATORY_CARE_PROVIDER_SITE_OTHER): Payer: Medicare Other | Admitting: Dietician

## 2012-01-25 VITALS — Ht 70.0 in | Wt 205.2 lb

## 2012-01-25 DIAGNOSIS — E119 Type 2 diabetes mellitus without complications: Secondary | ICD-10-CM

## 2012-01-25 NOTE — Progress Notes (Signed)
Medical Nutrition Therapy:  Appt start time: 1130 end time:  1215.  Assessment:  Primary concerns today: Blood sugar control and Annual Review. Patient her after re-initiation of NPH inulin at bedtime. A1C 7.9% recently, Lipids in January show slightly elevated LDL with low HDL and high triglycerides consistent with metabolic syndrome/insulin resistance. Intends to try to quit smoking again in the near future- to call [place] quitline this week. Usual eating pattern includes 3 meals and 3 snacks per day, intake is fairly healthy consisting of a variety of foods, trying to increase fiber, decrease fat and eat moderate carbs. Sometimes delays meals or eats lighter which has caused hypoglycemia in past.  Everyday foods include pot of caffeinated coffee per day.  Seldom foods include sweets and soda.   Usual physical activity includes works on yard and house 5 days a week. Other days is sedentary. Meter download: Since stopping glipizide and starting back on 12 units of N at bedtime: Fasting ave 214 Lunch 129 Dinner: ave 125 bedtime137 Progress Towards Goal(s):  In progress.   Nutritional Diagnosis:  Stephens-2.2 Altered nutrition-related laboratory As related to metabolic syndrome.  As evidenced by high triglycerides,  and blood sugar with decreased HDL.    Intervention:  1- Nutrition education about healthier fats, importance of consistent carbs . 2- Coordination of care- ask physician if patient may self titrate insulin and call back with dose that keeps fasting CBGs ~ 130 mg/dl.  3- Social support for efforts at self care: weight control, improving diet and regular exercise and quitting smoking 4- Goal setting- assisted patient in knowing how to set small achievable goals  Monitoring/Evaluation:  Dietary intake, exercise, blood sugars, and body weight in 5 month(s).

## 2012-01-25 NOTE — Patient Instructions (Signed)
I commend your great activity and eating habits and wanting to quit smoking..  Call the [place] Quitline ASAP!!!  You Your BMI today is 29.2 and your blood sugar 174.  You said you'd like to get down to 190#.  I suggest increasing your NPH insulin by 1 unit about every 3 days until your blood sugar is about 130 in the morning. I will talk to Dr. Yaakov Guthrie and call you  With whether he wants you to go ahead and do this.

## 2012-01-26 ENCOUNTER — Other Ambulatory Visit: Payer: Self-pay | Admitting: Internal Medicine

## 2012-01-28 NOTE — Assessment & Plan Note (Signed)
Bad reaction to Lantus, will stop that.  Will continue Metformin and start NPH insulin nightly 12 units (which he tolerated well).  Per diabetic educator meeting several days after this office visit, patient will increase NPH dose by 1 unit at a time until AM fasting glucose <130.  He may need Glipizide added in future if cannot reach HbA1c goal on NPH and metformin alone.

## 2012-01-28 NOTE — Progress Notes (Signed)
Subjective:   Patient ID: Riley Jones male   DOB: [birth year] 56 y.o.   MRN: [id number]  HPI: [name] is a 56 y.o. with depression, type 2 DM, HTN, well controlled HIV who presents for follow-up.  He reports that his diet has been so so.  His Blood sugars per meter have been high, 150-350.  He reports significant itching around lantus injection site, so much that he has had to stop using it three days ago.    He also had a bad reaction to Zoloft that I prescribed him previously.  He reports that he laid around for may days with depressed thoughts, including thoughts of hurting himself.  He stopped the medicine at a friend's advice and has since been doing much better. No thoughts of hurting himself since stopping Zoloft weeks ago.  Reports that his mood has been pretty good since stopping.    He takes his Vicodin about once per week.    Past Medical History  Diagnosis Date  . Depression     Stable, off meds.  . Type II diabetes mellitus     Well controlled   . Hypertension     Well Controlled off meds  . 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 Sampson Jones (ID)  . Hyperlipidemia   . Rheumatoid arthritis     Followed by Dr. Kellie Jones, off MTX and prdnisone since 2007  . Atypical angina     Myoview 2003, Normal EF 64%  . Foot pain 03/30/2011   Current Outpatient Prescriptions  Medication Sig Dispense Refill  . albuterol (VENTOLIN HFA) 108 (90 BASE) MCG/ACT inhaler Inhale 1-2 puffs into the lungs every 4 (four) hours as needed. for shortness of breath. Or may use every 6 hours as needed.       . ALPRAZolam (XANAX) 0.5 MG tablet Take 1 tablet (0.5 mg total) by mouth at bedtime as needed for sleep.  31 tablet  0  . amLODipine (NORVASC) 10 MG tablet Take 1 tablet (10 mg total) by mouth daily.  31 tablet  11  . aspirin 81 MG EC tablet Take 81 mg by mouth daily.        . cyclobenzaprine (FLEXERIL) 5 MG tablet Take 1 tablet (5 mg total)  by mouth 3 (three) times daily as needed for muscle spasms.  60 tablet  1  . efavirenz-emtrictabine-tenofovir (ATRIPLA) 600-200-300 MG per tablet Take 1 tablet by mouth daily.  30 tablet  prn  . Glucerna (GLUCERNA) LIQD Take 1 Can by mouth 2 (two) times daily.  1500 mL  prn  . glucose blood (FREESTYLE LITE) test strip 1 each by Other route 2 (two) times daily. Dx code 250.00  100 each  5  . hydrochlorothiazide (MICROZIDE) 12.5 MG capsule Take 1 capsule (12.5 mg total) by mouth daily.  30 capsule  11  . HYDROcodone-acetaminophen (VICODIN ES) 7.5-750 MG per tablet TAKE ONE TABLET BY MOUTH EVERY DAY AS NEEDED FOR PAIN  30 tablet  0  . insulin NPH (HUMULIN N) 100 UNIT/ML injection Inject 12 Units into the skin at bedtime.  1 vial  3  . Insulin Pen Needle 31G X 5 MM MISC Use it for insulin injection  100 each  11  . lisinopril (PRINIVIL,ZESTRIL) 20 MG tablet Take 1 tablet (20 mg total) by mouth daily.  31 tablet  4  . meloxicam (MOBIC) 15 MG tablet Take 1 tablet (15 mg total) by mouth daily.  14 tablet  0  . metFORMIN (GLUCOPHAGE) 1000 MG tablet Take 1 tablet (1,000 mg total) by mouth 2 (two) times daily with a meal.  60 tablet  11  . mometasone (NASONEX) 50 MCG/ACT nasal spray 2 sprays daily as needed. In each nostril      . omeprazole (PRILOSEC) 20 MG capsule Take 1 capsule (20 mg total) by mouth daily.  30 capsule  prn  . pravastatin (PRAVACHOL) 40 MG tablet Take 1 tablet (40 mg total) by mouth daily.  30 tablet  0  . pravastatin (PRAVACHOL) 40 MG tablet Take 1 tablet (40 mg total) by mouth at bedtime.  90 tablet  4  . terazosin (HYTRIN) 2 MG capsule Take 2 capsules (4 mg total) by mouth at bedtime.  60 capsule  5  . testosterone (ANDRODERM) 4 MG/24HR PT24 patch Place 1 patch onto the skin daily.  30 patch  3   Family History  Problem Relation Age of Onset  . Osteoporosis Mother   . Heart disease Father    History   Social History  . Marital Status: Single    Spouse Name: N/A    Number of  Children: N/A  . Years of Education: N/A   Occupational History  . Retired Naval architect    Social History Main Topics  . Smoking status: Current Everyday Smoker -- 1.0 packs/day    Types: Cigarettes  . Smokeless tobacco: Never Used   Comment: ready to quit, has Medicare now  . Alcohol Use: 0.6 oz/week    1 Glasses of wine per week     rare wine  . Drug Use: No  . Sexually Active: No     accepted condoms   Other Topics Concern  . None   Social History Narrative   [employer] approved beginning 12/11/2009 - 3/31/2012Ryan Jones benefits approved; patient eligible for 100% discount for out patient labs and office visits. Patient eligible for 70% discount for other services per Riley Jones 12/12/2011Patient is on disability, and walks 2-3 times per week.    Review of Systems: Constitutional: Denies fever, chills, diaphoresis, appetite change and fatigue.  Respiratory: Denies SOB, DOE, cough, chest tightness,  and wheezing.   Cardiovascular: Denies chest pain, palpitations and leg swelling.  Gastrointestinal: Denies nausea, vomiting, abdominal pain, diarrhea, constipation, blood in stool and abdominal distention.  Genitourinary: Denies dysuria, urgency, frequency, hematuria, flank pain and difficulty urinating.  Skin: Denies pallor, rash and wound.    Objective:  Physical Exam: Filed Vitals:   01/18/12 1425  BP: 121/82  Pulse: 76  Temp: 97.5 F (36.4 C)  TempSrc: Oral  Height: 5\' 10"  (1.778 m)  Weight: 206 lb 3.2 oz (93.532 kg)   Constitutional: Vital signs reviewed.  Patient is a well-developed and well-nourished man in no acute distress and cooperative with exam. Alert and oriented x3.  Head: Normocephalic and atraumatic Mouth: no erythema or exudates, MMM Eyes: PERRL, EOMI, conjunctivae normal, No scleral icterus.  Neck: No JVD Cardiovascular: RRR, S1 normal, S2 normal, no MRG, pulses symmetric and intact bilaterally Pulmonary/Chest: CTAB, no wheezes, rales, or  rhonchi Abdominal: Soft. Non-tender, non-distended, bowel sounds are normal  Neurological: A&O x3, Strength is normal and symmetric bilaterally, cranial nerve II-XII are grossly intact, no focal motor deficit, sensory intact to light touch bilaterally.  Skin: Warm, dry and intact. No rash, cyanosis, or clubbing.   Assessment & Plan:

## 2012-01-28 NOTE — Telephone Encounter (Signed)
Hydrocodone 7.5/750mg  rx called to [facility] pharmacy.

## 2012-02-23 ENCOUNTER — Other Ambulatory Visit: Payer: Self-pay | Admitting: *Deleted

## 2012-02-25 MED ORDER — HYDROCODONE-ACETAMINOPHEN 7.5-750 MG PO TABS: 1.0000 | ORAL_TABLET | Freq: Four times a day (QID) | ORAL | Status: DC | PRN

## 2012-02-25 NOTE — Telephone Encounter (Signed)
Rx called in to pharmacy. 

## 2012-02-29 ENCOUNTER — Encounter: Payer: Self-pay | Admitting: Internal Medicine

## 2012-02-29 ENCOUNTER — Ambulatory Visit (INDEPENDENT_AMBULATORY_CARE_PROVIDER_SITE_OTHER): Payer: Medicare Other | Admitting: Internal Medicine

## 2012-02-29 VITALS — BP 121/73 | HR 66 | Temp 97.1°F | Ht 70.0 in | Wt 206.1 lb

## 2012-02-29 DIAGNOSIS — I10 Essential (primary) hypertension: Secondary | ICD-10-CM

## 2012-02-29 DIAGNOSIS — E119 Type 2 diabetes mellitus without complications: Secondary | ICD-10-CM

## 2012-02-29 DIAGNOSIS — M549 Dorsalgia, unspecified: Secondary | ICD-10-CM

## 2012-02-29 DIAGNOSIS — E785 Hyperlipidemia, unspecified: Secondary | ICD-10-CM

## 2012-02-29 DIAGNOSIS — F329 Major depressive disorder, single episode, unspecified: Secondary | ICD-10-CM

## 2012-02-29 DIAGNOSIS — F3289 Other specified depressive episodes: Secondary | ICD-10-CM

## 2012-02-29 LAB — POCT GLYCOSYLATED HEMOGLOBIN (HGB A1C): Hemoglobin A1C: 8

## 2012-02-29 LAB — GLUCOSE, CAPILLARY: Glucose-Capillary: 281 mg/dL — ABNORMAL HIGH (ref 70–99)

## 2012-02-29 MED ORDER — MELOXICAM 15 MG PO TABS: 15.0000 mg | ORAL_TABLET | Freq: Every day | ORAL | Status: DC

## 2012-02-29 MED ORDER — "INSULIN SYRINGE 30G X 1/2"" 0.5 ML MISC": 1.0000 | Freq: Every evening | Status: DC

## 2012-02-29 MED ORDER — TERAZOSIN HCL 2 MG PO CAPS: 4.0000 mg | ORAL_CAPSULE | Freq: Every day | ORAL | Status: DC

## 2012-02-29 NOTE — Assessment & Plan Note (Signed)
He continues to have thoracic back pain.  Now spreading up his back, has some R shoulder pain too.  He does not want sports medicine referral for possible injection at this time. Vicodin are helping less than they used to, but he wants to avoid stronger pain medicine.  At future visits offer sports med referral if it seems indicated.  Continue Mobic and Vicodin.  Mobic refilled today

## 2012-02-29 NOTE — Assessment & Plan Note (Signed)
No SI/HI.  Doing ok.  No medication for this due to his adverse rxn to Zoloft

## 2012-02-29 NOTE — Progress Notes (Signed)
Subjective:   Patient ID: Riley Jones male   DOB: [birth year] 56 y.o.   MRN: [id number]  HPI: [name] is a 56 y.o. with well controlled HIV, HTN, DM, HLD here for follow-up.  No new complaints besides continued back pain since last fall.  Good adherence to HIV meds    Past Medical History  Diagnosis Date  . Depression     Stable, off meds.  . Type II diabetes mellitus     Well controlled   . Hypertension     Well Controlled off meds  . 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 Sampson Goon (ID)  . Hyperlipidemia   . Rheumatoid arthritis     Followed by Dr. Kellie Simmering, off MTX and prdnisone since 2007  . Atypical angina     Myoview 2003, Normal EF 64%  . Foot pain 03/30/2011   Current Outpatient Prescriptions  Medication Sig Dispense Refill  . albuterol (VENTOLIN HFA) 108 (90 BASE) MCG/ACT inhaler Inhale 1-2 puffs into the lungs every 4 (four) hours as needed. for shortness of breath. Or may use every 6 hours as needed.       . ALPRAZolam (XANAX) 0.5 MG tablet       . amLODipine (NORVASC) 10 MG tablet Take 1 tablet (10 mg total) by mouth daily.  31 tablet  11  . aspirin 81 MG EC tablet Take 81 mg by mouth daily.        . cyclobenzaprine (FLEXERIL) 5 MG tablet Take 1 tablet (5 mg total) by mouth 3 (three) times daily as needed for muscle spasms.  60 tablet  1  . efavirenz-emtrictabine-tenofovir (ATRIPLA) 600-200-300 MG per tablet Take 1 tablet by mouth daily.  30 tablet  prn  . Glucerna (GLUCERNA) LIQD Take 1 Can by mouth 2 (two) times daily.  1500 mL  prn  . glucose blood (FREESTYLE LITE) test strip 1 each by Other route 2 (two) times daily. Dx code 250.00  100 each  5  . hydrochlorothiazide (MICROZIDE) 12.5 MG capsule Take 1 capsule (12.5 mg total) by mouth daily.  30 capsule  11  . HYDROcodone-acetaminophen (VICODIN ES) 7.5-750 MG per tablet Take 1 tablet by mouth every 6 (six) hours as needed for pain.  30 tablet  0  . insulin  NPH (HUMULIN N) 100 UNIT/ML injection Inject 12 Units into the skin at bedtime.  1 vial  3  . Insulin Pen Needle 31G X 5 MM MISC Use it for insulin injection  100 each  11  . Insulin Syringe-Needle U-100 (INSULIN SYRINGE .5CC/30GX1/2") 30G X 1/2" 0.5 ML MISC 1 Syringe by Does not apply route Nightly.  100 each  3  . lisinopril (PRINIVIL,ZESTRIL) 20 MG tablet Take 1 tablet (20 mg total) by mouth daily.  31 tablet  4  . meloxicam (MOBIC) 15 MG tablet Take 1 tablet (15 mg total) by mouth daily.  30 tablet  3  . metFORMIN (GLUCOPHAGE) 1000 MG tablet Take 1 tablet (1,000 mg total) by mouth 2 (two) times daily with a meal.  60 tablet  11  . mometasone (NASONEX) 50 MCG/ACT nasal spray 2 sprays daily as needed. In each nostril      . omeprazole (PRILOSEC) 20 MG capsule Take 1 capsule (20 mg total) by mouth daily.  30 capsule  prn  . pravastatin (PRAVACHOL) 40 MG tablet Take 1 tablet (40 mg total) by mouth daily.  30 tablet  0  .  pravastatin (PRAVACHOL) 40 MG tablet Take 1 tablet (40 mg total) by mouth at bedtime.  90 tablet  4  . terazosin (HYTRIN) 2 MG capsule Take 2 capsules (4 mg total) by mouth at bedtime.  60 capsule  5  . testosterone (ANDRODERM) 4 MG/24HR PT24 patch Place 1 patch onto the skin daily.  30 patch  3  . DISCONTD: terazosin (HYTRIN) 2 MG capsule Take 2 capsules (4 mg total) by mouth at bedtime.  60 capsule  5   Family History  Problem Relation Age of Onset  . Osteoporosis Mother   . Heart disease Father    History   Social History  . Marital Status: Single    Spouse Name: N/A    Number of Children: N/A  . Years of Education: N/A   Occupational History  . Retired Naval architect    Social History Main Topics  . Smoking status: Current Everyday Smoker -- 1.0 packs/day    Types: Cigarettes  . Smokeless tobacco: Never Used   Comment: ready to quit, has Medicare now  . Alcohol Use: 0.6 oz/week    1 Glasses of wine per week     rare wine  . Drug Use: No  . Sexually Active: No      accepted condoms   Other Topics Concern  . None   Social History Narrative   [employer] approved beginning 12/11/2009 - 3/31/2012Ryan White benefits approved; patient eligible for 100% discount for out patient labs and office visits. Patient eligible for 70% discount for other services per Kandice Robinsons 12/12/2011Patient is on disability, and walks 2-3 times per week.    Review of Systems: Constitutional: Denies fever, chills, diaphoresis, appetite change and fatigue.  Respiratory: Denies SOB, DOE, cough, chest tightness,  and wheezing.   Cardiovascular: Denies chest pain, palpitations and leg swelling.  Gastrointestinal: Denies nausea, vomiting, abdominal pain, diarrhea, constipation, blood in stool and abdominal distention.  Genitourinary: Denies dysuria, urgency, frequency, hematuria, flank pain and difficulty urinating.  Skin: Denies pallor, rash and wound.  Neurological: Denies dizziness, seizures, syncope, weakness, light-headedness, numbness and headaches.  Hematological: Denies adenopathy. Easy bruising, personal or family bleeding history   Objective:  Physical Exam: Filed Vitals:   02/29/12 1352  BP: 121/73  Pulse: 66  Temp: 97.1 F (36.2 C)  TempSrc: Oral  Height: 5\' 10"  (1.778 m)  Weight: 206 lb 1.6 oz (93.486 kg)   Constitutional: Vital signs reviewed.  Patient is a well-developed and well-nourished man in no acute distress and cooperative with exam. Alert and oriented x3.  Head: Normocephalic and atraumatic Mouth: no erythema or exudates, MMM Eyes: PERRL, EOMI, conjunctivae normal, No scleral icterus.  Neck: No JVD  Cardiovascular: RRR, S1 normal, S2 normal, no MRG, pulses symmetric and intact bilaterally Pulmonary/Chest: CTAB, no wheezes, rales, or rhonchi  Musculoskeletal: Back is tender centrally in thoracic area, but this is mild.  Similar to previous.  R shoulder had full active and passive ROM with only some mild pain at the very top of abduction.  Flexion  causes no pain.  Neurological: A&O x3, Strength is normal and symmetric bilaterally, cranial nerve II-XII are grossly intact, no focal motor deficit, sensory intact to light touch bilaterally.  Skin: Warm, dry and intact. No rash, cyanosis, or clubbing.    Assessment & Plan:

## 2012-02-29 NOTE — Assessment & Plan Note (Signed)
HbA1c 8.0%, was 7.9% in February.  Since sugars were notably high, often in 300s, prior to when I saw him in April, I suspect since that time they have been doing pretty well to make HbA1c 8% (~represents an avg over past 3 months).  His sugar log supports this, 113-218.  Plan is for him to keep increasing NPH, increase by 1 unit every three days, until AM fasting glucose average is 130 or less.  He is keep increasing NPH nightly dose until avg glucose < 130 or he reaches 30 units NPH.  He is to call clinic when his sugars reach average fasting sugar of 130 and tell us the NPH dose required.  He is to call if he has a sugar < 80.  Return to clinic in 3 months for HbA1c recheck.  Things are going well for his diabetes.  Continue metformin

## 2012-02-29 NOTE — Patient Instructions (Signed)
Please increase your NPH insulin dose by 1 unit every three days until your morning fasting blood sugar averages 130 or lower.  Do not try to get the morning sugar below 130. When your morning sugar averages 130, please call the clinic to let us know what NPH insulin dose works for you.  Please call the clinic if you have a blood glucose reading <80.    Return to clinic in 3 months for diabetes check

## 2012-03-04 ENCOUNTER — Telehealth: Payer: Self-pay | Admitting: *Deleted

## 2012-03-04 NOTE — Telephone Encounter (Signed)
Info faxed over to ID - Dr Ninetta Lights ( along with Dr Donnelly Stager note ).

## 2012-03-04 NOTE — Telephone Encounter (Signed)
Pulled [place] narc database and Dr Ninetta Lights has been Rxing Xanax. He has no contract with [facility] and little documentation to justify xanax. Therefore, defer to Dr Ninetta Lights.

## 2012-03-04 NOTE — Telephone Encounter (Signed)
Needs refill on Lorazepam 1mg  # 30 [facility] [address] - last refill 09/14/11 and was written 09/14/11.

## 2012-03-07 ENCOUNTER — Other Ambulatory Visit: Payer: Medicare Other

## 2012-03-09 ENCOUNTER — Other Ambulatory Visit: Payer: Self-pay | Admitting: *Deleted

## 2012-03-09 ENCOUNTER — Telehealth: Payer: Self-pay | Admitting: *Deleted

## 2012-03-09 ENCOUNTER — Other Ambulatory Visit: Payer: Self-pay | Admitting: Infectious Diseases

## 2012-03-09 NOTE — Telephone Encounter (Signed)
Notified patient that Dr. Ninetta Lights will discuss with him the prescription for Lorazepam at his next office visit 03/28/12.  He was ok with waiting until the upcoming appointment. Riley Jones CMA

## 2012-03-14 ENCOUNTER — Other Ambulatory Visit: Payer: Medicare Other

## 2012-03-14 DIAGNOSIS — B20 Human immunodeficiency virus [HIV] disease: Secondary | ICD-10-CM

## 2012-03-14 DIAGNOSIS — E785 Hyperlipidemia, unspecified: Secondary | ICD-10-CM

## 2012-03-14 LAB — COMPLETE METABOLIC PANEL WITH GFR
ALT: 55 U/L — ABNORMAL HIGH (ref 0–53)
AST: 32 U/L (ref 0–37)
Albumin: 4.3 g/dL (ref 3.5–5.2)
CO2: 27 mEq/L (ref 19–32)
Calcium: 9.8 mg/dL (ref 8.4–10.5)
Chloride: 104 mEq/L (ref 96–112)
Creat: 0.82 mg/dL (ref 0.50–1.35)
GFR, Est African American: >89 mL/min
Potassium: 4.6 mEq/L (ref 3.5–5.3)
Sodium: 138 mEq/L (ref 135–145)
Total Protein: 6.6 g/dL (ref 6.0–8.3)

## 2012-03-14 LAB — LIPID PANEL
HDL: 39 mg/dL — ABNORMAL LOW (ref >39)
LDL Cholesterol: 107 mg/dL — ABNORMAL HIGH (ref 0–99)
Total CHOL/HDL Ratio: 4.5 Ratio
Triglycerides: 143 mg/dL (ref <150)
VLDL: 29 mg/dL (ref 0–40)

## 2012-03-14 LAB — CBC
Platelets: 216 10*3/uL (ref 150–400)
RBC: 4.78 MIL/uL (ref 4.22–5.81)
RDW: 12.8 % (ref 11.5–15.5)
WBC: 7.1 10*3/uL (ref 4.0–10.5)

## 2012-03-16 LAB — HIV-1 RNA QUANT-NO REFLEX-BLD
HIV 1 RNA Quant: <20 copies/mL (ref <20)
HIV-1 RNA Quant, Log: <1.3 {Log} (ref <1.30)

## 2012-03-21 ENCOUNTER — Ambulatory Visit: Payer: Medicare Other | Admitting: Infectious Diseases

## 2012-03-24 ENCOUNTER — Other Ambulatory Visit: Payer: Self-pay | Admitting: *Deleted

## 2012-03-24 MED ORDER — HYDROCODONE-ACETAMINOPHEN 7.5-750 MG PO TABS: 1.0000 | ORAL_TABLET | Freq: Four times a day (QID) | ORAL | Status: DC | PRN

## 2012-03-24 NOTE — Telephone Encounter (Signed)
Rx called into pharmacy and appt to be made 05/2012 per Dr Rogelia Boga.

## 2012-03-24 NOTE — Telephone Encounter (Signed)
Pls ask him to make appt sometime in Sep

## 2012-03-28 ENCOUNTER — Encounter: Payer: Self-pay | Admitting: Infectious Diseases

## 2012-03-28 ENCOUNTER — Ambulatory Visit (INDEPENDENT_AMBULATORY_CARE_PROVIDER_SITE_OTHER): Payer: Medicare Other | Admitting: Infectious Diseases

## 2012-03-28 ENCOUNTER — Other Ambulatory Visit: Payer: Self-pay | Admitting: *Deleted

## 2012-03-28 VITALS — BP 131/79 | HR 67 | Temp 97.5°F | Wt 207.0 lb

## 2012-03-28 DIAGNOSIS — B20 Human immunodeficiency virus [HIV] disease: Secondary | ICD-10-CM

## 2012-03-28 DIAGNOSIS — G4700 Insomnia, unspecified: Secondary | ICD-10-CM

## 2012-03-28 DIAGNOSIS — E119 Type 2 diabetes mellitus without complications: Secondary | ICD-10-CM

## 2012-03-28 DIAGNOSIS — F17200 Nicotine dependence, unspecified, uncomplicated: Secondary | ICD-10-CM

## 2012-03-28 MED ORDER — PRAVASTATIN SODIUM 40 MG PO TABS: 40.0000 mg | ORAL_TABLET | Freq: Every day | ORAL | Status: DC

## 2012-03-28 MED ORDER — ALPRAZOLAM 0.5 MG PO TABS: 0.5000 mg | ORAL_TABLET | Freq: Every evening | ORAL | Status: DC | PRN

## 2012-03-28 MED ORDER — ALPRAZOLAM 0.5 MG PO TABS
0.5000 mg | ORAL_TABLET | Freq: Every evening | ORAL | Status: DC | PRN
Start: 2012-03-28 — End: 2012-07-18

## 2012-03-28 NOTE — Assessment & Plan Note (Signed)
He is doing very well. Will cont his current rx. Due to abn dreams could try to change to complera but he is on ppi. Some concern about using stribild and his DM (and his risk of renal dz). Pt does not want to switch. Offered condoms (this makes him laugh as he is not dating). Will rtc in 6 months. vax up to date.

## 2012-03-28 NOTE — Telephone Encounter (Signed)
Rx phoned in.   

## 2012-03-28 NOTE — Assessment & Plan Note (Signed)
Will cont to f/u with IM. States his sugars have been getting slowly better. Has not had ophtho this year.

## 2012-03-28 NOTE — Telephone Encounter (Signed)
Needs Sep appt for routine DM care

## 2012-03-28 NOTE — Assessment & Plan Note (Signed)
Offered to change him to Lunesta/ambien/restoril. He wishes to stay on xanax. Feels tired all the time.

## 2012-03-28 NOTE — Assessment & Plan Note (Signed)
Encouraged to quit. 

## 2012-03-28 NOTE — Progress Notes (Signed)
  Subjective:    Patient ID: Riley Jones, male    DOB: Dec 07, 1955, 56 y.o.   MRN: [id number]  HPI 56 yo M with hx of HIV+ (~47yrs), DM2 (>45yrs) and RA. He has been maintained on atripla (has been on multiple previous meds).  Cont to have arthritic pain (shoulders, back). Sleeping poorly. Having abnormal dreams.   HIV 1 RNA Quant (copies/mL)  Date Value  03/14/2012 <20   10/07/2011 <20   05/25/2011 48*     CD4 T Cell Abs (cmm)  Date Value  03/14/2012 880   10/07/2011 900   05/25/2011 960      Review of Systems  Constitutional: Negative for fever, chills and unexpected weight change.  Gastrointestinal: Negative for diarrhea and constipation.  Genitourinary: Negative for dysuria.  Musculoskeletal: Positive for arthralgias.       Objective:   Physical Exam  Constitutional: He appears well-developed and well-nourished.  Eyes: EOM are normal. Pupils are equal, round, and reactive to light.  Neck: Neck supple.  Cardiovascular: Normal rate, regular rhythm and normal heart sounds.   Pulmonary/Chest: Effort normal and breath sounds normal.  Abdominal: Soft. Bowel sounds are normal. He exhibits distension. There is no tenderness.          Assessment & Plan:

## 2012-03-28 NOTE — Telephone Encounter (Signed)
Flag sent to front desk pool about appt per Dr Rogelia Boga.

## 2012-04-25 ENCOUNTER — Other Ambulatory Visit: Payer: Self-pay | Admitting: Internal Medicine

## 2012-04-26 NOTE — Telephone Encounter (Signed)
Rx called in to pharmacy. 

## 2012-05-24 ENCOUNTER — Other Ambulatory Visit: Payer: Self-pay | Admitting: Internal Medicine

## 2012-05-25 NOTE — Telephone Encounter (Signed)
Based on Dr. Thane Edu FYI documentation  "We have been prescribing vicodin for Riley Jones's back pain.  He was having thoracic back pain for a few months.  At last evaluation we thought it was likely due to muscle tension and referred to physical therapy.  At next visit with [place] ask if he went to physical therapy/if it helped.  We did not intend the vicodin to be chronic, just to help him through the acute exacerbation and to get him to PT.  Ask if the mobic we prescribed helped him at all.  If he no showed to PT then he cannot get more vicodin from [place] until he goes."  I will not prescribe more narcotic pain medication to Riley Jones. He needs to be seen and evaluated in the clinic if he wish to be on a chronic narcotic med.

## 2012-05-27 ENCOUNTER — Telehealth: Payer: Self-pay | Admitting: *Deleted

## 2012-05-27 NOTE — Telephone Encounter (Signed)
Return pt's call. Pt requesting refill on pain med. Informed pt to keep his appt 06/09/12 with Dr Kem Kays to discuss this per Dr Eben Burow 's note 05/24/12; pt agreed.

## 2012-05-27 NOTE — Telephone Encounter (Signed)
Thanks, we will discuss this on his next visit.

## 2012-06-09 ENCOUNTER — Encounter: Payer: Self-pay | Admitting: Internal Medicine

## 2012-06-09 ENCOUNTER — Ambulatory Visit (INDEPENDENT_AMBULATORY_CARE_PROVIDER_SITE_OTHER): Payer: Medicare Other | Admitting: Internal Medicine

## 2012-06-09 ENCOUNTER — Ambulatory Visit (HOSPITAL_COMMUNITY)
Admission: RE | Admit: 2012-06-09 | Discharge: 2012-06-09 | Disposition: A | Discharge status: Home / Self Care | Payer: Medicare Other | Source: Ambulatory Visit | Attending: Cardiology | Admitting: Cardiology

## 2012-06-09 VITALS — BP 142/84 | HR 75 | Temp 97.1°F | Ht 69.0 in | Wt 212.2 lb

## 2012-06-09 DIAGNOSIS — I10 Essential (primary) hypertension: Secondary | ICD-10-CM

## 2012-06-09 DIAGNOSIS — K921 Melena: Secondary | ICD-10-CM

## 2012-06-09 DIAGNOSIS — E119 Type 2 diabetes mellitus without complications: Secondary | ICD-10-CM

## 2012-06-09 DIAGNOSIS — B20 Human immunodeficiency virus [HIV] disease: Secondary | ICD-10-CM

## 2012-06-09 DIAGNOSIS — Z23 Encounter for immunization: Secondary | ICD-10-CM

## 2012-06-09 DIAGNOSIS — R079 Chest pain, unspecified: Secondary | ICD-10-CM | POA: Insufficient documentation

## 2012-06-09 DIAGNOSIS — M069 Rheumatoid arthritis, unspecified: Secondary | ICD-10-CM

## 2012-06-09 LAB — CBC WITH DIFFERENTIAL/PLATELET
Basophils Absolute: 0.1 10*3/uL (ref 0.0–0.1)
Eosinophils Absolute: 0.2 10*3/uL (ref 0.0–0.7)
Eosinophils Relative: 3 % (ref 0–5)
HCT: 40.5 % (ref 39.0–52.0)
MCH: 32.7 pg (ref 26.0–34.0)
MCV: 89.6 fL (ref 78.0–100.0)
Monocytes Absolute: 0.6 10*3/uL (ref 0.1–1.0)
Platelets: 174 10*3/uL (ref 150–400)
RDW: 12.8 % (ref 11.5–15.5)

## 2012-06-09 LAB — GLUCOSE, CAPILLARY: Glucose-Capillary: 140 mg/dL — ABNORMAL HIGH (ref 70–99)

## 2012-06-09 LAB — POCT GLYCOSYLATED HEMOGLOBIN (HGB A1C): Hemoglobin A1C: 7.2

## 2012-06-09 LAB — IRON AND TIBC: %SAT: 17 % — ABNORMAL LOW (ref 20–55)

## 2012-06-09 MED ORDER — INSULIN NPH (HUMAN) (ISOPHANE) 100 UNIT/ML ~~LOC~~ SUSP: 30.0000 [IU] | Freq: Every day | SUBCUTANEOUS | Status: DC

## 2012-06-09 MED ORDER — HYDROCODONE-ACETAMINOPHEN 7.5-750 MG PO TABS: 1.0000 | ORAL_TABLET | Freq: Four times a day (QID) | ORAL | Status: DC | PRN

## 2012-06-09 MED ORDER — AMLODIPINE BESYLATE 10 MG PO TABS: 10.0000 mg | ORAL_TABLET | Freq: Every day | ORAL | Status: DC

## 2012-06-09 NOTE — Patient Instructions (Signed)
Please schedule your appointments for: Rheumatology, gastroenterology, cardiology, and opthalmology. If you have CP, call 911. If you have dizziness, CP, and recurrent black stools, call clinic immediately or 911. Obtain blood tests today. Follow up 1 month.

## 2012-06-09 NOTE — Addendum Note (Signed)
Addended by: Angelina Ok F on: 06/09/2012 01:49 PM   Modules accepted: Orders

## 2012-06-09 NOTE — Progress Notes (Signed)
Subjective:    Patient ID: KYREL LEIGHTON, male    DOB: 1956/01/11, 56 y.o.   MRN: [id number]  HPI 56 yr. Old WM presents for follow up.  Overall, feels well. He states he has been increased pain in right hand and wrists. He states he's had joint injections in his hands, for "trigger finger".  He states he used to see rheumatology and was on methotrexate, but was taken off for unknown reasons.  He admits he probably just stopped going.  He states the pain in both hands is nonstop, throbbing in nature. Denies any significant dizziness, HA's, admits to chronic lower back pain (no hx of injuries). He admits he has been having abdominal pain on the right lower quadrant that radiates to the left.  The abdominal pain at times lasts for two weeks in a row.  He states he has been having "black stools" for months.  Denies N/V/C. Occasional diarrhea. He admits he has occasional chest discomfort, especially with overexertion. He admits when he mows his lawn he has significant CP associated with SOB that causes him to have to stop and rest.  He admits this has been going off and on for years.  At times, this is associated with Nausea and emesis.  The chest discomfort also is associated with tingling of his LUE. Denies any CP today. He states he has to test his blood sugar about three times a day, especially when he is active. He reports one episode of hypoglycemia when he mowed his lawn, BG 57, due to not eating prior to taking his insulin. He is still smoking cigarettes, about a 1 pack per day x 49 yrs.  Review of Systems The complete 12 point review of systems is otherwise negative except for that stated in the HPI.    Objective:   Physical Exam GEN: AAOx3, NAD. HEENT: EOMI, PERRLA, no icterus, no adenopathy, moist mucosa, no thrush. CV: S1S2, no m/r/g, RRR. PULM: CTA bilat. GI/ABD: Soft, NT, +BS, no guarding, very small umbilical hernia that is reducible and non tender. No palpable masses. LE/UE: 2/4  pulses. No c/c/e. His foot exam reveals no ulcerations or lesions and he has intact sensation to monofilament. NEURO: CN II-XII intact, no focal deficits.     Assessment & Plan:  56 yr. Old WM w/ pmhx significant for depression, Type 2 IDDM, HTN, HIV followed by ID on HAART, HL, RA, tobacco abuse, presents for follow up, complains of melena and exertional CP. 1) Exertional CP: EKG today. Refer to cardiology. Advised if reoccurs, to call 911. EKG with some lateral T wave inversion, no previous for comparison. 2) HTN: States he checks his BP at home, but he thinks his machine is broken. Needs to bring in a BP log, slightly elevated today. Will send in Rx for cuff. 3) Melena: Check CBC, iron panel today. Refer to GI. Will hold ASA if H/H seen to be trending down. Abdominal pain may be secondary to ulcer. 3) Type 2 IDDM: HbA1c is coming down. He needs to eat prior to using insulin. Report any further hypoglycemia. Cont current dose. 4) HIV: Followed by ID. On HAART. 5) HL: LDL is 107. On statin therapy, re-evaluate in 3 months. 6) RA: refer to rheum again, he needs re-evaluation to consider DMARD therapy. 7) Depression: Off medication, doing well. Denies any suicidal ideation. 8) Health Maintenance: Refer to GI for colo and possible EGD. He has had pneumovax. Flu vaccine today. Refer to optho. Foot exam done today.  Counseled on tobacco abuse and quitting and given quit line number, he admits he has quit "[place]" in past with 5 years success. He wants to try again. Return in 1 month.  Jonah Blue

## 2012-06-13 ENCOUNTER — Encounter: Payer: Self-pay | Admitting: Cardiovascular Disease

## 2012-06-13 ENCOUNTER — Ambulatory Visit (INDEPENDENT_AMBULATORY_CARE_PROVIDER_SITE_OTHER): Payer: Medicare Other | Admitting: Cardiovascular Disease

## 2012-06-13 VITALS — BP 137/79 | HR 65 | Ht 69.0 in | Wt 210.0 lb

## 2012-06-13 DIAGNOSIS — R079 Chest pain, unspecified: Secondary | ICD-10-CM

## 2012-06-13 DIAGNOSIS — E119 Type 2 diabetes mellitus without complications: Secondary | ICD-10-CM

## 2012-06-13 DIAGNOSIS — I10 Essential (primary) hypertension: Secondary | ICD-10-CM

## 2012-06-13 DIAGNOSIS — E785 Hyperlipidemia, unspecified: Secondary | ICD-10-CM

## 2012-06-13 DIAGNOSIS — F17200 Nicotine dependence, unspecified, uncomplicated: Secondary | ICD-10-CM

## 2012-06-13 NOTE — Assessment & Plan Note (Signed)
Discussed low carb diet.  Target hemoglobin A1c is 6.5 or less.  Continue current medications.  

## 2012-06-13 NOTE — Progress Notes (Signed)
Patient ID: Riley Jones, male   DOB: 1956-02-03, 56 y.o.   MRN: [id number] 56 yo referred by Dr Kem Kays chest pain.  Family history , smoker DM and elevated chol.  Pain for years.  Atypical sharp stabbing pain.  Non exertional.  ECG has lateral T wave changes.  Had myovue many years ago that was normal.  Coundseled for less than 10 minutes on smoking cessation  Last quit about 6 years ago cold [place].  Doesn't like welbutrin feels lethargic and out of control.  No dyspnea.  Sees [facility] for HIV.  Viral load has been undetectable for years.  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  Pain has not worsened but is persistant.  No pleuritic component.     ROS: Denies fever, malais, weight loss, blurry vision, decreased visual acuity, cough, sputum, SOB, hemoptysis, pleuritic pain, palpitaitons, heartburn, abdominal pain, melena, lower extremity edema, claudication, or rash.  All other systems reviewed and negative   General: Affect appropriate Healthy:  appears stated age HEENT: normal Neck supple with no adenopathy JVP normal no bruits no thyromegaly Lungs clear with no wheezing and good diaphragmatic motion Heart:  S1/S2 no murmur,rub, gallop or click PMI normal Abdomen: benighn, BS positve, no tenderness, no AAA no bruit.  No HSM or HJR Distal pulses intact with no bruits No edema Neuro non-focal Skin warm and dry No muscular weakness  Medications Current Outpatient Prescriptions  Medication Sig Dispense Refill  . albuterol (VENTOLIN HFA) 108 (90 BASE) MCG/ACT inhaler Inhale 1-2 puffs into the lungs every 4 (four) hours as needed. for shortness of breath. Or may use every 6 hours as needed.       . ALPRAZolam (XANAX) 0.5 MG tablet Take 1 tablet (0.5 mg total) by mouth at bedtime as needed for sleep.  30 tablet  3  . amLODipine (NORVASC) 10 MG tablet Take 1 tablet (10 mg total) by mouth daily.  31 tablet  5  . aspirin 81 MG EC  tablet Take 81 mg by mouth daily.        Marland Kitchen efavirenz-emtrictabine-tenofovir (ATRIPLA) 600-200-300 MG per tablet Take 1 tablet by mouth daily.  30 tablet  prn  . Glucerna (GLUCERNA) LIQD Take 1 Can by mouth as needed.      Marland Kitchen glucose blood (FREESTYLE LITE) test strip 1 each by Other route 2 (two) times daily. Dx code 250.00  100 each  5  . hydrochlorothiazide (MICROZIDE) 12.5 MG capsule Take 1 capsule (12.5 mg total) by mouth daily.  30 capsule  11  . HYDROcodone-acetaminophen (VICODIN ES) 7.5-750 MG per tablet Take 1 tablet by mouth every 6 (six) hours as needed for pain.  45 tablet  0  . insulin NPH (HUMULIN N,NOVOLIN N) 100 UNIT/ML injection Inject 30 Units into the skin at bedtime.  1 vial  5  . Insulin Pen Needle 31G X 5 MM MISC Use it for insulin injection  100 each  11  . Insulin Syringe-Needle U-100 (INSULIN SYRINGE .5CC/30GX1/2") 30G X 1/2" 0.5 ML MISC 1 Syringe by Does not apply route Nightly.  100 each  3  . lisinopril (PRINIVIL,ZESTRIL) 20 MG tablet Take 1 tablet (20 mg total) by mouth daily.  31 tablet  4  . meloxicam (MOBIC) 15 MG tablet Take 1 tablet (15 mg total) by mouth daily.  30 tablet  3  . metFORMIN (GLUCOPHAGE) 1000 MG tablet Take 1 tablet (1,000 mg total) by mouth  2 (two) times daily with a meal.  60 tablet  11  . mometasone (NASONEX) 50 MCG/ACT nasal spray 2 sprays daily as needed. In each nostril      . omeprazole (PRILOSEC) 20 MG capsule Take 1 capsule (20 mg total) by mouth daily.  30 capsule  prn  . pravastatin (PRAVACHOL) 40 MG tablet Take 1 tablet (40 mg total) by mouth at bedtime.  90 tablet  3  . terazosin (HYTRIN) 2 MG capsule Take 2 capsules (4 mg total) by mouth at bedtime.  60 capsule  5    Allergies Zoloft  Family History: Family History  Problem Relation Age of Onset  . Osteoporosis Mother   . Heart disease Father     Social History: History   Social History  . Marital Status: Single    Spouse Name: N/A    Number of Children: N/A  . Years of  Education: N/A   Occupational History  . Retired [employer] architect    Social History Main Topics  . Smoking status: Current Every Day Smoker -- 1.0 packs/day    Types: Cigarettes  . Smokeless tobacco: Never Used   Comment: ready to quit, has Medicare now  . Alcohol Use: 0.6 oz/week    1 Glasses of wine per week     rare wine  . Drug Use: No  . Sexually Active: No     accepted condoms   Other Topics Concern  . Not on file   Social History Narrative   [employer] approved beginning 12/11/2009 - 3/31/2012Ryan White benefits approved; patient eligible for 100% discount for out patient labs and office visits. Patient eligible for 70% discount for other services per Kandice Robinsons 12/12/2011Patient is on disability, and walks 2-3 times per week.     Electrocardiogram:  06/09/12  SR rate 60  Lateral T wave changes  Assessment and Plan

## 2012-06-13 NOTE — Assessment & Plan Note (Signed)
Well controlled.  Continue current medications and low sodium Dash type diet.    

## 2012-06-13 NOTE — Assessment & Plan Note (Signed)
Cholesterol is at goal.  Continue current dose of statin and diet Rx.  No myalgias or side effects.  F/U  LFT's in 6 months. Lab Results  Component Value Date   LDLCALC 107* 03/14/2012

## 2012-06-13 NOTE — Assessment & Plan Note (Signed)
Atypical but abnormal ECG and multiple risk factors especially smoking and DM  F/U stress myovue

## 2012-06-13 NOTE — Patient Instructions (Addendum)
Your physician recommends that you schedule a follow-up appointment in: AS NEEDED  Your physician recommends that you continue on your current medications as directed. Please refer to the Current Medication list given to you today.  Your physician has requested that you have en exercise stress myoview. For further information please visit [email or web address]. Please follow instruction sheet, as given.  DX CHEST PAIN   

## 2012-06-13 NOTE — Assessment & Plan Note (Signed)
Counseled Currently not motivated to quit and does not want welbutrin or chantix

## 2012-06-20 ENCOUNTER — Ambulatory Visit (HOSPITAL_COMMUNITY): Payer: Medicare Other | Attending: Internal Medicine | Admitting: Radiology

## 2012-06-20 VITALS — BP 120/71 | HR 62 | Ht 69.0 in | Wt 206.0 lb

## 2012-06-20 DIAGNOSIS — R42 Dizziness and giddiness: Secondary | ICD-10-CM | POA: Insufficient documentation

## 2012-06-20 DIAGNOSIS — R0989 Other specified symptoms and signs involving the circulatory and respiratory systems: Secondary | ICD-10-CM | POA: Insufficient documentation

## 2012-06-20 DIAGNOSIS — R61 Generalized hyperhidrosis: Secondary | ICD-10-CM | POA: Insufficient documentation

## 2012-06-20 DIAGNOSIS — F17200 Nicotine dependence, unspecified, uncomplicated: Secondary | ICD-10-CM | POA: Insufficient documentation

## 2012-06-20 DIAGNOSIS — E119 Type 2 diabetes mellitus without complications: Secondary | ICD-10-CM | POA: Insufficient documentation

## 2012-06-20 DIAGNOSIS — Z8249 Family history of ischemic heart disease and other diseases of the circulatory system: Secondary | ICD-10-CM | POA: Insufficient documentation

## 2012-06-20 DIAGNOSIS — R079 Chest pain, unspecified: Secondary | ICD-10-CM | POA: Insufficient documentation

## 2012-06-20 DIAGNOSIS — R0602 Shortness of breath: Secondary | ICD-10-CM

## 2012-06-20 DIAGNOSIS — I10 Essential (primary) hypertension: Secondary | ICD-10-CM | POA: Insufficient documentation

## 2012-06-20 DIAGNOSIS — E785 Hyperlipidemia, unspecified: Secondary | ICD-10-CM

## 2012-06-20 DIAGNOSIS — R0609 Other forms of dyspnea: Secondary | ICD-10-CM | POA: Insufficient documentation

## 2012-06-20 DIAGNOSIS — R9431 Abnormal electrocardiogram [ECG] [EKG]: Secondary | ICD-10-CM

## 2012-06-20 MED ORDER — TECHNETIUM TC 99M SESTAMIBI GENERIC - CARDIOLITE
30.0000 | Freq: Once | INTRAVENOUS | Status: AC | PRN
  Administered 2012-06-20: 30 via INTRAVENOUS

## 2012-06-20 MED ORDER — TECHNETIUM TC 99M SESTAMIBI GENERIC - CARDIOLITE
10.0000 | Freq: Once | INTRAVENOUS | Status: AC | PRN
  Administered 2012-06-20: 10 via INTRAVENOUS

## 2012-06-20 NOTE — Progress Notes (Signed)
[facility] SITE 3 NUCLEAR MED [address] [place] [address] [phone]  Cardiology Nuclear Med Study  Riley Jones is a 56 y.o. male     MRN : [id number]     DOB: 02/17/1956  Procedure Date: 06/20/2012  Nuclear Med Background Indication for Stress Test:  Evaluation for Ischemia and Abnormal EKG History:  '03 KGM:WNUUVO, EF=64% Cardiac Risk Factors: Family History - CAD, Hypertension, IDDM Type 2, Lipids and Smoker  Symptoms:  Chest Pain/Tightness with and without Exertion (last episode of chest discomfort is now, 1-2/10), Diaphoresis, Dizziness, DOE/SOB, Fatigue with Exertion, Nausea and Rapid HR    Nuclear Pre-Procedure Caffeine/Decaff Intake:  None > 12 hrs NPO After: 6:30pm   Lungs:  Clear. O2 Sat: 97% on room air. IV 0.9% NS with Angio Cath:  20g  IV Site: R Wrist x 1, tolerated well IV Started by:  Irean Hong, RN  Chest Size (in):  46 Cup Size: n/a  Height: 5\' 9"  (1.753 m)  Weight:  206 lb (93.441 kg)  BMI:  Body mass index is 30.42 kg/(m^2). Tech Comments:  FBS was 144 at 3:00 am per patient, no insulin x 36 hrs    Nuclear Med Study 1 or 2 day study: 1 day  Stress Test Type:  Stress  Reading MD: Cassell Clement, MD  Order Authorizing Provider:  Charlton Haws, MD  Resting Radionuclide: Technetium 75m Sestamibi  Resting Radionuclide Dose: 11.0 mCi   Stress Radionuclide:  Technetium 55m Sestamibi  Stress Radionuclide Dose: 33.0 mCi           Stress Protocol Rest HR: 62 Stress HR: 148  Rest BP: 120/71 Stress BP: 188/74  Exercise Time (min): 09:01 METS: 10.1   Predicted Max HR: 162 bpm % Max HR: 91.36 bpm Rate Pressure Product: 53664   Dose of Adenosine (mg):  n/a Dose of Lexiscan: n/a mg  Dose of Atropine (mg): n/a Dose of Dobutamine: n/a   Stress Test Technologist: Smiley Houseman, CMA-N  Nuclear Technologist:  Domenic Polite, CNMT     Rest Procedure:  Myocardial perfusion imaging was performed at rest 45 minutes following the  intravenous administration of Technetium 31m Sestamibi.  Rest ECG: Nonspecific T-wave changes.  Stress Procedure:  The patient exercised on the treadmill utilizing the Bruce  Protocol for 9:01 minutes. He then stopped due to fatigue.  He had chest pain prior to exercise 2/10, that only increased to 3/10 with exercise..  There were no diagnostic ST-T wave changes, occasional PAC's were noted.  Technetium 72m Sestamibi was injected at peak exercise and myocardial perfusion imaging was performed after a brief delay.  Stress ECG: No significant change from baseline ECG  QPS Raw Data Images:  Normal; no motion artifact; normal heart/lung ratio. Stress Images:  Normal homogeneous uptake in all areas of the myocardium. Rest Images:  Normal homogeneous uptake in all areas of the myocardium. Subtraction (SDS):  No evidence of ischemia. Transient Ischemic Dilatation (Normal <1.22):  1.08 Lung/Heart Ratio (Normal <0.45):  0.45  Quantitative Gated Spect Images QGS EDV:  110 ml QGS ESV:  39 ml  Impression Exercise Capacity:  Good exercise capacity. BP Response:  Normal blood pressure response. Clinical Symptoms:  Mild chest pain/dyspnea. ECG Impression:  No significant ST segment change suggestive of ischemia. Comparison with Prior Nuclear Study: No images to compare  Overall Impression:  Normal stress nuclear study.  LV Ejection Fraction: 64%.  LV Wall Motion:  NL LV Function; NL Wall Motion  Riley Jones  Riley Jones

## 2012-06-28 ENCOUNTER — Other Ambulatory Visit: Payer: Self-pay | Admitting: *Deleted

## 2012-06-28 DIAGNOSIS — B20 Human immunodeficiency virus [HIV] disease: Secondary | ICD-10-CM

## 2012-06-28 MED ORDER — EFAVIRENZ-EMTRICITAB-TENOFOVIR 600-200-300 MG PO TABS: 1.0000 | ORAL_TABLET | Freq: Every day | ORAL | Status: DC

## 2012-06-28 NOTE — Telephone Encounter (Signed)
Called patient to verify that this pharmacy in [place] is where he wants his medications sent to and he advised yes.

## 2012-06-29 ENCOUNTER — Other Ambulatory Visit: Payer: Self-pay | Admitting: *Deleted

## 2012-06-29 DIAGNOSIS — K219 Gastro-esophageal reflux disease without esophagitis: Secondary | ICD-10-CM

## 2012-06-29 MED ORDER — OMEPRAZOLE 20 MG PO CPDR: 20.0000 mg | DELAYED_RELEASE_CAPSULE | Freq: Every day | ORAL | Status: DC

## 2012-07-04 ENCOUNTER — Other Ambulatory Visit: Payer: Self-pay | Admitting: Internal Medicine

## 2012-07-04 ENCOUNTER — Other Ambulatory Visit: Payer: Self-pay | Admitting: *Deleted

## 2012-07-04 DIAGNOSIS — M549 Dorsalgia, unspecified: Secondary | ICD-10-CM

## 2012-07-04 NOTE — Telephone Encounter (Signed)
Can be phoned in. 

## 2012-07-05 ENCOUNTER — Other Ambulatory Visit: Payer: Self-pay | Admitting: Internal Medicine

## 2012-07-05 MED ORDER — MELOXICAM 15 MG PO TABS: 15.0000 mg | ORAL_TABLET | Freq: Every day | ORAL | Status: DC

## 2012-07-05 NOTE — Telephone Encounter (Signed)
Was this called in yesterday? I believe I already wrote for this yesterday. Thanks.

## 2012-07-06 ENCOUNTER — Other Ambulatory Visit: Payer: Self-pay | Admitting: Gastroenterology

## 2012-07-06 DIAGNOSIS — R1084 Generalized abdominal pain: Secondary | ICD-10-CM

## 2012-07-06 DIAGNOSIS — R7989 Other specified abnormal findings of blood chemistry: Secondary | ICD-10-CM

## 2012-07-07 NOTE — Telephone Encounter (Signed)
Yes, this was called in.

## 2012-07-12 ENCOUNTER — Ambulatory Visit
Admission: RE | Admit: 2012-07-12 | Discharge: 2012-07-12 | Disposition: A | Discharge status: Home / Self Care | Payer: Medicare Other | Source: Ambulatory Visit | Attending: Gastroenterology | Admitting: Gastroenterology

## 2012-07-12 DIAGNOSIS — R1084 Generalized abdominal pain: Secondary | ICD-10-CM

## 2012-07-12 DIAGNOSIS — R7989 Other specified abnormal findings of blood chemistry: Secondary | ICD-10-CM

## 2012-07-14 ENCOUNTER — Ambulatory Visit (INDEPENDENT_AMBULATORY_CARE_PROVIDER_SITE_OTHER): Payer: Medicare Other | Admitting: Internal Medicine

## 2012-07-14 ENCOUNTER — Encounter: Payer: Self-pay | Admitting: Internal Medicine

## 2012-07-14 VITALS — BP 136/79 | HR 72 | Temp 97.5°F | Ht 69.0 in | Wt 211.0 lb

## 2012-07-14 DIAGNOSIS — I10 Essential (primary) hypertension: Secondary | ICD-10-CM

## 2012-07-14 DIAGNOSIS — M069 Rheumatoid arthritis, unspecified: Secondary | ICD-10-CM

## 2012-07-14 DIAGNOSIS — D509 Iron deficiency anemia, unspecified: Secondary | ICD-10-CM

## 2012-07-14 DIAGNOSIS — E119 Type 2 diabetes mellitus without complications: Secondary | ICD-10-CM

## 2012-07-14 DIAGNOSIS — E785 Hyperlipidemia, unspecified: Secondary | ICD-10-CM

## 2012-07-14 DIAGNOSIS — E611 Iron deficiency: Secondary | ICD-10-CM

## 2012-07-14 LAB — GLUCOSE, CAPILLARY: Glucose-Capillary: 121 mg/dL — ABNORMAL HIGH (ref 70–99)

## 2012-07-14 MED ORDER — HYDROCHLOROTHIAZIDE 12.5 MG PO CAPS: 12.5000 mg | ORAL_CAPSULE | Freq: Every day | ORAL | Status: DC

## 2012-07-14 MED ORDER — AMLODIPINE BESYLATE 10 MG PO TABS: 10.0000 mg | ORAL_TABLET | Freq: Every day | ORAL | Status: DC

## 2012-07-14 MED ORDER — FERROUS SULFATE 325 (65 FE) MG PO TBEC: 325.0000 mg | DELAYED_RELEASE_TABLET | Freq: Two times a day (BID) | ORAL | Status: DC

## 2012-07-14 MED ORDER — HYDROCODONE-ACETAMINOPHEN 7.5-750 MG PO TABS: 1.0000 | ORAL_TABLET | Freq: Four times a day (QID) | ORAL | Status: DC | PRN

## 2012-07-14 MED ORDER — TERAZOSIN HCL 2 MG PO CAPS: 4.0000 mg | ORAL_CAPSULE | Freq: Every day | ORAL | Status: DC

## 2012-07-14 NOTE — Progress Notes (Signed)
  Subjective:    Patient ID: Riley Jones, male    DOB: 03/06/1956, 56 y.o.   MRN: [id number]  HPI Recently had nuclear stress test with no wall motion abnormalities and normal EF.  He was able to see Dr. Elnoria Howard a few weeks ago. He is scheduled for his colonoscopy on Nov 5th.  He was unable to go see Rheumatology due to schedule issues. He is attempting to get an appointment to see optho, but is waiting until he can afford his copay. He states Dr. Elnoria Howard did some "blood work" with some abnormalities found, subsequently had an U/S of his abdomen performed. He is following up with Dr. Elnoria Howard for this. He has evidence of cholelithiasis and thickened GB wall. He denies N/V, abdominal pain. No difficulty tolerating diet. He has occasional diarrhea. He has cut down to 2 cigarettes a shift. He states his highest BG has been 150's. Denies any hypoglycemia.  Review of his BG low shows values 94-234.  Denies any further melena. Denies hematochezia. Denies further CP.  Review of Systems Complete 12 point review of systems otherwise negative except for that stated in the HPI.    Objective:   Physical Exam Filed Vitals:   07/14/12 0837  BP: 136/79  Pulse: 72  Temp: 97.5 F (36.4 C)  Body mass index is 31.16 kg/(m^2).  GEN: AAOx3, NAD. HEENT: EOMI, PERRLA, moist mucosa, no adenopathy. TM gray with good light reflex bilat, no lesions.No oral lesions. CV: S1S2, no m/r/g, RRR. PULM: CTA bilat. ABD/GI: Soft, NT, +BS, no guarding, no murphy's sign, no distention. LE/UE: 2/4 pulses, no c/c/e. No lesions.     Assessment & Plan:  56 yr. Old WM w/ pmhx significant for depression, Type 2 IDDM, HTN, HIV followed by ID on HAART, HL, RA, tobacco abuse, presents for follow up. 1) HTN: Well controlled. 2) Type 2 DM: No reported hypoglycemia. Check A1c next visit. Last HbA1c was 7.2%. Cont current therapy. 3) HIV: On HAART. On followed by ID. 4) RA: Pending referral to Rheumatology, discussed with patient. Needs  consideration for DMARD therapy. On chronic vicodin for now. Hold mobic pending GI workup. 5) HL: LDL 107. On statin therapy. 6) Recent hx melena: Due for colonoscopy, pending next month. No further reports of melena. I advised him to hold mobic for now pending GI workup. 7) Depression: Off medication, doing well. Denies any suicidal ideation.  8) Iron deficiency: Start Iron 325 mg bid. Pending GI workup. 9) Obesity: Body mass index is 31.16 kg/(m^2). Educated on diet. 10) Health Maintenance: Pending Colonoscopy. He has had pneumovax. Flu vaccine last visit. Optho still pending. He is slowly quitting tobacco use. Return in 2 months.  Jonah Blue

## 2012-07-14 NOTE — Patient Instructions (Addendum)
-  Return in two months. -Follow up with Rheumatology, Eye doctor, and GI doctor for colonoscopy. -Stop Mobic.

## 2012-07-18 ENCOUNTER — Other Ambulatory Visit: Payer: Self-pay | Admitting: Infectious Diseases

## 2012-07-18 DIAGNOSIS — F419 Anxiety disorder, unspecified: Secondary | ICD-10-CM

## 2012-09-06 ENCOUNTER — Other Ambulatory Visit: Payer: Self-pay | Admitting: *Deleted

## 2012-09-06 DIAGNOSIS — E119 Type 2 diabetes mellitus without complications: Secondary | ICD-10-CM

## 2012-09-07 MED ORDER — LISINOPRIL 20 MG PO TABS: 20.0000 mg | ORAL_TABLET | Freq: Every day | ORAL | Status: DC

## 2012-09-12 ENCOUNTER — Other Ambulatory Visit (INDEPENDENT_AMBULATORY_CARE_PROVIDER_SITE_OTHER): Payer: Medicare Other

## 2012-09-12 DIAGNOSIS — B20 Human immunodeficiency virus [HIV] disease: Secondary | ICD-10-CM

## 2012-09-12 LAB — COMPREHENSIVE METABOLIC PANEL
Albumin: 4.6 g/dL (ref 3.5–5.2)
Alkaline Phosphatase: 95 U/L (ref 39–117)
BUN: 11 mg/dL (ref 6–23)
CO2: 24 mEq/L (ref 19–32)
Calcium: 10 mg/dL (ref 8.4–10.5)
Chloride: 100 mEq/L (ref 96–112)
Glucose, Bld: 143 mg/dL — ABNORMAL HIGH (ref 70–99)
Potassium: 4.4 mEq/L (ref 3.5–5.3)

## 2012-09-12 LAB — CBC
HCT: 43.4 % (ref 39.0–52.0)
Hemoglobin: 15.6 g/dL (ref 13.0–17.0)
MCHC: 35.9 g/dL (ref 30.0–36.0)
RBC: 4.73 MIL/uL (ref 4.22–5.81)

## 2012-09-13 LAB — HIV-1 RNA QUANT-NO REFLEX-BLD: HIV-1 RNA Quant, Log: <1.3 {Log} (ref <1.30)

## 2012-09-13 LAB — T-HELPER CELL (CD4) - (RCID CLINIC ONLY): CD4 T Cell Abs: 920 uL (ref 400–2700)

## 2012-09-26 ENCOUNTER — Other Ambulatory Visit: Payer: Self-pay | Admitting: *Deleted

## 2012-09-26 ENCOUNTER — Ambulatory Visit: Payer: Medicare Other | Admitting: Infectious Diseases

## 2012-09-26 DIAGNOSIS — E611 Iron deficiency: Secondary | ICD-10-CM

## 2012-09-26 DIAGNOSIS — I10 Essential (primary) hypertension: Secondary | ICD-10-CM

## 2012-09-26 DIAGNOSIS — E119 Type 2 diabetes mellitus without complications: Secondary | ICD-10-CM

## 2012-09-26 DIAGNOSIS — E785 Hyperlipidemia, unspecified: Secondary | ICD-10-CM

## 2012-09-26 DIAGNOSIS — M069 Rheumatoid arthritis, unspecified: Secondary | ICD-10-CM

## 2012-09-26 NOTE — Telephone Encounter (Signed)
review 

## 2012-09-29 MED ORDER — HYDROCODONE-ACETAMINOPHEN 7.5-750 MG PO TABS: 1.0000 | ORAL_TABLET | Freq: Four times a day (QID) | ORAL | Status: DC | PRN

## 2012-09-29 NOTE — Telephone Encounter (Signed)
Rx called in to pharmacy per Dr Kem Kays generic Vicodin ES 7.5/300mg  per Dr Kem Kays.

## 2012-10-05 ENCOUNTER — Other Ambulatory Visit: Payer: Self-pay | Admitting: *Deleted

## 2012-10-05 ENCOUNTER — Ambulatory Visit (INDEPENDENT_AMBULATORY_CARE_PROVIDER_SITE_OTHER): Payer: Medicare HMO | Admitting: Infectious Diseases

## 2012-10-05 ENCOUNTER — Encounter: Payer: Self-pay | Admitting: Infectious Diseases

## 2012-10-05 VITALS — BP 139/86 | HR 61 | Temp 97.1°F | Ht 69.0 in | Wt 198.0 lb

## 2012-10-05 DIAGNOSIS — Z79899 Other long term (current) drug therapy: Secondary | ICD-10-CM

## 2012-10-05 DIAGNOSIS — B20 Human immunodeficiency virus [HIV] disease: Secondary | ICD-10-CM

## 2012-10-05 DIAGNOSIS — F419 Anxiety disorder, unspecified: Secondary | ICD-10-CM

## 2012-10-05 DIAGNOSIS — G4700 Insomnia, unspecified: Secondary | ICD-10-CM

## 2012-10-05 DIAGNOSIS — F17200 Nicotine dependence, unspecified, uncomplicated: Secondary | ICD-10-CM

## 2012-10-05 DIAGNOSIS — E119 Type 2 diabetes mellitus without complications: Secondary | ICD-10-CM

## 2012-10-05 DIAGNOSIS — Z113 Encounter for screening for infections with a predominantly sexual mode of transmission: Secondary | ICD-10-CM

## 2012-10-05 MED ORDER — EFAVIRENZ-EMTRICITAB-TENOFOVIR 600-200-300 MG PO TABS: 1.0000 | ORAL_TABLET | Freq: Every day | ORAL | Status: DC

## 2012-10-05 MED ORDER — ALPRAZOLAM 0.5 MG PO TABS: 0.5000 mg | ORAL_TABLET | Freq: Every evening | ORAL | Status: DC | PRN

## 2012-10-05 NOTE — Progress Notes (Signed)
  Subjective:    Patient ID: Riley Jones, male    DOB: 1955/11/27, 57 y.o.   MRN: [id number]  HPI 57 yo M with hx of HIV+ (~87yrs), DM2 (>55yrs) and RA. He has been maintained on atripla (has been on multiple previous meds). Since last visit has had eval for atypical CP (had normal stress).  Ha had f/u in IM for his DM. Has been back to work, has lost weight, eating better/healthier. Has cut back on his smoking. Still having trouble with insomnia. FSG have been good (~130 in AM), been off insulin for 2 months.   HIV 1 RNA Quant (copies/mL)  Date Value  09/12/2012 <20   03/14/2012 <20   10/07/2011 <20      CD4 T Cell Abs (cmm)  Date Value  09/12/2012 920   03/14/2012 880   10/07/2011 900       Review of Systems  Constitutional: Negative for appetite change and unexpected weight change.  Gastrointestinal: Negative for diarrhea and constipation.  Genitourinary: Negative for difficulty urinating.  Skin: Positive for rash.       Faint erythema on UE, abd. Dry skin and cracking on his hands. No diabetic foot lesions       Objective:   Physical Exam  Constitutional: He appears well-developed and well-nourished.  HENT:  Mouth/Throat: No oropharyngeal exudate.  Eyes: EOM are normal. Pupils are equal, round, and reactive to light.  Neck: Neck supple.  Cardiovascular: Normal rate, regular rhythm and normal heart sounds.   Pulmonary/Chest: Effort normal and breath sounds normal.  Abdominal: Soft. Bowel sounds are normal. There is no tenderness.  Lymphadenopathy:    He has no cervical adenopathy.          Assessment & Plan:

## 2012-10-05 NOTE — Assessment & Plan Note (Signed)
Will refill his xanax. Encouraged him to learn deep breathing/relaxation exercises to help with sleep onset.

## 2012-10-05 NOTE — Assessment & Plan Note (Signed)
Encouraged him to quit 

## 2012-10-05 NOTE — Assessment & Plan Note (Signed)
Greatly appreciate IM f/u. He is planning on getting ophtho this year.

## 2012-10-05 NOTE — Assessment & Plan Note (Signed)
Doing very well. Will refill his rxs. Offered/refuses condoms.  See him back in 6 months. His vaccines are up to date.

## 2012-10-06 MED ORDER — GLUCOSE BLOOD VI STRP: 1.0000 | ORAL_STRIP | Freq: Two times a day (BID) | Status: DC

## 2012-10-07 ENCOUNTER — Other Ambulatory Visit: Payer: Self-pay | Admitting: Infectious Diseases

## 2012-10-18 ENCOUNTER — Ambulatory Visit (INDEPENDENT_AMBULATORY_CARE_PROVIDER_SITE_OTHER): Payer: Medicare HMO | Admitting: Radiation Oncology

## 2012-10-18 ENCOUNTER — Other Ambulatory Visit: Payer: Self-pay | Admitting: Infectious Diseases

## 2012-10-18 ENCOUNTER — Telehealth: Payer: Self-pay | Admitting: *Deleted

## 2012-10-18 ENCOUNTER — Encounter: Payer: Self-pay | Admitting: Radiation Oncology

## 2012-10-18 VITALS — BP 122/71 | HR 70 | Temp 96.8°F | Ht 69.0 in | Wt 196.6 lb

## 2012-10-18 DIAGNOSIS — I10 Essential (primary) hypertension: Secondary | ICD-10-CM

## 2012-10-18 DIAGNOSIS — Z79899 Other long term (current) drug therapy: Secondary | ICD-10-CM

## 2012-10-18 DIAGNOSIS — L299 Pruritus, unspecified: Secondary | ICD-10-CM | POA: Insufficient documentation

## 2012-10-18 DIAGNOSIS — E119 Type 2 diabetes mellitus without complications: Secondary | ICD-10-CM

## 2012-10-18 MED ORDER — DOXEPIN HCL (ANTIPRURITIC) 5 % EX CREA: TOPICAL_CREAM | Freq: Two times a day (BID) | CUTANEOUS | Status: DC | PRN

## 2012-10-18 MED ORDER — TRIAMCINOLONE ACETONIDE 0.025 % EX OINT: TOPICAL_OINTMENT | Freq: Two times a day (BID) | CUTANEOUS | Status: DC | PRN

## 2012-10-18 MED ORDER — DIPHENHYDRAMINE HCL 50 MG PO TABS: 50.0000 mg | ORAL_TABLET | Freq: Every evening | ORAL | Status: DC | PRN

## 2012-10-18 NOTE — Assessment & Plan Note (Signed)
Unclear etiology. No actual skin lesions. No chemical exposures. No bed bugs or scabies exposures. No new medications. Most likely secondary to dry skin associated with colder and less humid weather. Patient has a humidifier in his room, which he believes may help his symptoms some. He was prescribed a topical corticosteroid, a topical anti-histamine, and an oral antihistamine for nighttime itching. Will avoid oral antihistamines during the day, and pt was cautioned that doxepin may cause drowsiness even as a topical treatment, and to first use at night.  - kenalog 0.025% ointment PRN - doxepin 5% cream PRN - benadryl 50mg  PRN qhs

## 2012-10-18 NOTE — Assessment & Plan Note (Signed)
BP Readings from Last 3 Encounters:  10/18/12 122/71  10/05/12 139/86  07/14/12 136/79    Lab Results  Component Value Date   NA 137 09/12/2012   K 4.4 09/12/2012   CREATININE 0.84 09/12/2012    Assessment:  Blood pressure control: controlled  Progress toward BP goal:  at goal  Comments:   Plan:  Medications:  continue current medications  Educational resources provided:    Self management tools provided:    Other plans:

## 2012-10-18 NOTE — Progress Notes (Signed)
Subjective:    Patient ID: Riley Jones, male    DOB: 1955/12/23, 57 y.o.   MRN: [id number]  HPI Patient is a 57 yo man with PMH significant for HIV with CD4 = 920, anxiety, DM (for comprehensive PMH, see "Problem List") who presents with complaints of itching of bilateral arms and thighs for the last few weeks. The patient states that he typically has some symptoms of itching every year with cold weather, but believes that his symptoms presently are slightly worse than usual. He states that he uses a soap and detergent for sensitive skin, and has been using lotions and an OTC topical corticosteroid cream with modest of  improvement of his symptoms. He states that he has 2 dogs, however he has never had any allergic skin reactions to them. He denies any recent exposure to new chemicals. He denies any history of bed bugs or scabies, and states that he keeps his house very clean. He denies any itching around the genitals. He denies any actual rash. Denies fever, chills or recent illness.   Review of Systems  All other systems reviewed and are negative.     Current Outpatient Medications: Current Outpatient Prescriptions  Medication Sig Dispense Refill  . albuterol (VENTOLIN HFA) 108 (90 BASE) MCG/ACT inhaler Inhale 1-2 puffs into the lungs every 4 (four) hours as needed. for shortness of breath. Or may use every 6 hours as needed.       . ALPRAZolam (XANAX) 0.5 MG tablet Take 1 tablet (0.5 mg total) by mouth at bedtime as needed for sleep.  40 tablet  2  . amLODipine (NORVASC) 10 MG tablet Take 1 tablet (10 mg total) by mouth daily.  31 tablet  5  . aspirin 81 MG EC tablet Take 81 mg by mouth daily.        . diphenhydrAMINE (BENADRYL) 50 MG tablet Take 1 tablet (50 mg total) by mouth at bedtime as needed for itching.  30 tablet  0  . doxepin (ZONALON) 5 % cream Apply topically 2 (two) times daily as needed for itching.  30 g  0  . efavirenz-emtricitabine-tenofovir (ATRIPLA) 600-200-300 MG per  tablet Take 1 tablet by mouth daily.  90 tablet  5  . ferrous sulfate 325 (65 FE) MG EC tablet Take 1 tablet (325 mg total) by mouth 2 (two) times daily.  60 tablet  3  . glucose blood (FREESTYLE LITE) test strip 1 each by Other route 2 (two) times daily. Dx code 250.00  100 each  5  . hydrochlorothiazide (MICROZIDE) 12.5 MG capsule Take 1 capsule (12.5 mg total) by mouth daily.  30 capsule  4  . HYDROcodone-acetaminophen (VICODIN ES) 7.5-750 MG per tablet Take 1 tablet by mouth every 6 (six) hours as needed for pain.  45 tablet  2  . insulin NPH (HUMULIN N,NOVOLIN N) 100 UNIT/ML injection Inject 30 Units into the skin at bedtime.  1 vial  5  . Insulin Pen Needle 31G X 5 MM MISC Use it for insulin injection  100 each  11  . Insulin Syringe-Needle U-100 (INSULIN SYRINGE .5CC/30GX1/2") 30G X 1/2" 0.5 ML MISC 1 Syringe by Does not apply route Nightly.  100 each  3  . lisinopril (PRINIVIL,ZESTRIL) 20 MG tablet Take 1 tablet (20 mg total) by mouth daily.  30 tablet  2  . metFORMIN (GLUCOPHAGE) 1000 MG tablet Take 1 tablet (1,000 mg total) by mouth 2 (two) times daily with a meal.  60 tablet  11  . mometasone (NASONEX) 50 MCG/ACT nasal spray 2 sprays daily as needed. In each nostril      . omeprazole (PRILOSEC) 20 MG capsule Take 1 capsule (20 mg total) by mouth daily.  30 capsule  prn  . pravastatin (PRAVACHOL) 40 MG tablet Take 1 tablet (40 mg total) by mouth at bedtime.  90 tablet  3  . terazosin (HYTRIN) 2 MG capsule Take 2 capsules (4 mg total) by mouth at bedtime.  60 capsule  5  . triamcinolone (KENALOG) 0.025 % ointment Apply topically 2 (two) times daily as needed.  30 g  0    Allergies: Allergies  Allergen Reactions  . Zoloft (Sertraline Hcl)     Patient reported Psychomotor slowing / worsened depression     Past Medical History: Past Medical History  Diagnosis Date  . Depression     Stable, off meds.  . Type II diabetes mellitus     Well controlled   . Hypertension     Well  Controlled off meds  . 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 Sampson Goon (ID)  . Hyperlipidemia   . Rheumatoid arthritis     Followed by Dr. Kellie Simmering, off MTX and prdnisone since 2007  . Atypical angina     Myoview 2003, Normal EF 64%  . Foot pain 03/30/2011    Past Surgical History: No past surgical history on file.  Family History: Family History  Problem Relation Age of Onset  . Osteoporosis Mother   . Heart disease Father     Social History: History   Social History  . Marital Status: Single    Spouse Name: N/A    Number of Children: N/A  . Years of Education: N/A   Occupational History  . Retired Naval architect    Social History Main Topics  . Smoking status: Current Every Day Smoker -- 1.0 packs/day    Types: Cigarettes  . Smokeless tobacco: Never Used     Comment: ready to quit, has Medicare now  . Alcohol Use: 0.6 oz/week    1 Glasses of wine per week     Comment: rare wine  . Drug Use: No  . Sexually Active: No     Comment: accepted condoms   Other Topics Concern  . Not on file   Social History Narrative   [employer] approved beginning 12/11/2009 - 3/31/2012Ryan White benefits approved; patient eligible for 100% discount for out patient labs and office visits. Patient eligible for 70% discount for other services per Kandice Robinsons 12/12/2011Patient is on disability, and walks 2-3 times per week.      Vital Signs: Blood pressure 122/71, pulse 70, temperature 96.8 F (36 C), temperature source Oral, height 5\' 9"  (1.753 m), weight 196 lb 9.6 oz (89.177 kg), SpO2 98.00%.      Objective:   Physical Exam  Constitutional: He is oriented to person, place, and time. He appears well-developed and well-nourished. No distress.  HENT:  Head: Normocephalic and atraumatic.  Eyes: Conjunctivae normal are normal. Pupils are equal, round, and reactive to light. No scleral icterus.  Neck: Normal range of motion. Neck  supple. No tracheal deviation present.  Cardiovascular: Normal rate and regular rhythm.   No murmur heard. Pulmonary/Chest: Effort normal. He has no wheezes. He has no rales.  Abdominal: Soft. Bowel sounds are normal. He exhibits no distension. There is no tenderness.  Musculoskeletal: Normal range of motion. He exhibits no edema.  Neurological:  He is alert and oriented to person, place, and time. No cranial nerve deficit.  Skin: Skin is warm and dry. No rash noted.       Mild excoriations over dorsal surfaces of bilateral hands, as well as on the medial R thigh. No other lesions appreciated.   Psychiatric: He has a normal mood and affect. His behavior is normal.          Assessment & Plan:

## 2012-10-18 NOTE — Patient Instructions (Signed)
Itching Itching is a symptom that can be caused by many things. These include skin problems (including infections) as well as some internal diseases.  If the itching is affecting just one area of the body, it is most likely due to a common skin problem, such as:  Poison oak and poison ivy.  Contact dermatitis (skin irritation from a plant, chemicals, fiberglass, detergents, new cosmetic, new jewelry, or other substance).  Fungus (such as athlete's foot, jock itch, or ringworm).  Head lice  Dandruff  Insect bite  Infection (such as Shingles or other virus infections). If the itching is all over (widespread), the possible causes are many. These include:   Dry skin or eczema  Heat rash  Hives  Liver disorders  Kidney disorders TREATMENT  Localized itching   Lubrication of the skin. Use an ointment or cream or other unperfumed moisturizers if the skin is dry. Apply frequently, especially after bathing.  Anti-itch medicines. These medications may help control the urge to scratch. Scratching always makes itching worse and increases the chance of getting an infection.  Cortisone creams and ointments. These help reduce the inflammation.  Antibiotics. Skin infections can cause itching. Topical or oral antibiotics may be needed for 10 to 20 days to get rid of an infection. If you can identify what caused the itching, avoid this substance in the future.  Widespread itching  The following measures may help to relieve itching regardless of the cause:   Wash the skin once with soap to remove irritants.  Bathe in tepid water with baking soda, cornstarch, or oatmeal.  Use calamine lotion (nonprescription) or a baking soda solution (1 teaspoon in 4 ounces of water on the skin).  Apply 1% hydrocortisone cream (no prescription needed). Do not use this if there might be a skin infection.  Avoid scratching.  Avoid itchy or tight-fitting clothes.  Avoid excessive heat, sweating,  scented soaps, and swimming pools.  The lubricants, anti-itch medicines, etc. noted above may be helpful for controlling symptoms. SEEK MEDICAL CARE IF:   The itching becomes severe.  Your itch is not better after 1 week of treatment. Contact your caregiver to schedule further evaluation. Document Released: 09/14/2005 Document Revised: 12/07/2011 Document Reviewed: 03/04/2007 [employer] Patient Information 2013 [employer].  

## 2012-10-18 NOTE — Telephone Encounter (Signed)
Pt called asking for appointment, c/o rash to thighs and arms and back.  It hurts and itches.  Red. Onset  2 weeks but getting worse.  He has tried OTC meds without relief.  Will see at 2:15 today

## 2012-10-18 NOTE — Assessment & Plan Note (Signed)
Lab Results  Component Value Date   HGBA1C 6.9 10/18/2012   HGBA1C 7.2 06/09/2012   HGBA1C 8.0 02/29/2012     Assessment:  Diabetes control: good control (HgbA1C at goal)  Progress toward A1C goal:  at goal  Comments:   Plan:  Medications:  continue current medications  Home glucose monitoring:   Frequency:     Timing:    Instruction/counseling given:   Educational resources provided:    Self management tools provided:    Other plans:

## 2012-10-21 ENCOUNTER — Other Ambulatory Visit: Payer: Self-pay | Admitting: *Deleted

## 2012-10-21 DIAGNOSIS — E119 Type 2 diabetes mellitus without complications: Secondary | ICD-10-CM

## 2012-10-21 MED ORDER — METFORMIN HCL 1000 MG PO TABS: 1000.0000 mg | ORAL_TABLET | Freq: Two times a day (BID) | ORAL | Status: DC

## 2012-10-22 ENCOUNTER — Other Ambulatory Visit: Payer: Self-pay | Admitting: Infectious Diseases

## 2012-10-31 ENCOUNTER — Other Ambulatory Visit: Payer: Self-pay | Admitting: Licensed Clinical Social Worker

## 2012-10-31 DIAGNOSIS — F419 Anxiety disorder, unspecified: Secondary | ICD-10-CM

## 2012-10-31 MED ORDER — ALPRAZOLAM 0.5 MG PO TABS: 0.5000 mg | ORAL_TABLET | Freq: Every evening | ORAL | Status: DC | PRN

## 2012-11-24 ENCOUNTER — Other Ambulatory Visit: Payer: Self-pay | Admitting: Radiation Oncology

## 2012-12-02 ENCOUNTER — Ambulatory Visit: Payer: Medicare HMO

## 2012-12-03 ENCOUNTER — Inpatient Hospital Stay (HOSPITAL_COMMUNITY)
Admission: EM | Admit: 2012-12-03 | Discharge: 2012-12-04 | DRG: 312 | Disposition: A | Discharge status: Home / Self Care | Payer: Medicare HMO | Attending: Internal Medicine | Admitting: Internal Medicine

## 2012-12-03 ENCOUNTER — Emergency Department (HOSPITAL_COMMUNITY): Payer: Medicare HMO

## 2012-12-03 ENCOUNTER — Encounter (HOSPITAL_COMMUNITY): Payer: Self-pay | Admitting: Emergency Medicine

## 2012-12-03 DIAGNOSIS — R42 Dizziness and giddiness: Secondary | ICD-10-CM | POA: Diagnosis present

## 2012-12-03 DIAGNOSIS — B20 Human immunodeficiency virus [HIV] disease: Secondary | ICD-10-CM | POA: Diagnosis present

## 2012-12-03 DIAGNOSIS — E119 Type 2 diabetes mellitus without complications: Secondary | ICD-10-CM | POA: Diagnosis present

## 2012-12-03 DIAGNOSIS — I10 Essential (primary) hypertension: Secondary | ICD-10-CM | POA: Diagnosis present

## 2012-12-03 DIAGNOSIS — R079 Chest pain, unspecified: Secondary | ICD-10-CM | POA: Diagnosis present

## 2012-12-03 DIAGNOSIS — Z79899 Other long term (current) drug therapy: Secondary | ICD-10-CM

## 2012-12-03 DIAGNOSIS — J4489 Other specified chronic obstructive pulmonary disease: Secondary | ICD-10-CM | POA: Diagnosis present

## 2012-12-03 DIAGNOSIS — R55 Syncope and collapse: Principal | ICD-10-CM | POA: Diagnosis present

## 2012-12-03 DIAGNOSIS — Z7982 Long term (current) use of aspirin: Secondary | ICD-10-CM

## 2012-12-03 DIAGNOSIS — F17200 Nicotine dependence, unspecified, uncomplicated: Secondary | ICD-10-CM | POA: Diagnosis present

## 2012-12-03 DIAGNOSIS — R4701 Aphasia: Secondary | ICD-10-CM | POA: Diagnosis present

## 2012-12-03 DIAGNOSIS — E785 Hyperlipidemia, unspecified: Secondary | ICD-10-CM | POA: Diagnosis present

## 2012-12-03 DIAGNOSIS — F329 Major depressive disorder, single episode, unspecified: Secondary | ICD-10-CM | POA: Diagnosis present

## 2012-12-03 DIAGNOSIS — F411 Generalized anxiety disorder: Secondary | ICD-10-CM

## 2012-12-03 DIAGNOSIS — J449 Chronic obstructive pulmonary disease, unspecified: Secondary | ICD-10-CM | POA: Diagnosis not present

## 2012-12-03 DIAGNOSIS — F3289 Other specified depressive episodes: Secondary | ICD-10-CM | POA: Diagnosis present

## 2012-12-03 DIAGNOSIS — E1142 Type 2 diabetes mellitus with diabetic polyneuropathy: Secondary | ICD-10-CM | POA: Diagnosis present

## 2012-12-03 DIAGNOSIS — Z794 Long term (current) use of insulin: Secondary | ICD-10-CM

## 2012-12-03 DIAGNOSIS — M069 Rheumatoid arthritis, unspecified: Secondary | ICD-10-CM | POA: Diagnosis present

## 2012-12-03 LAB — BASIC METABOLIC PANEL
BUN: 17 mg/dL (ref 6–23)
Calcium: 9.8 mg/dL (ref 8.4–10.5)
Creatinine, Ser: 0.79 mg/dL (ref 0.50–1.35)
GFR calc Af Amer: >90 mL/min (ref >90)
GFR calc non Af Amer: >90 mL/min (ref >90)
Glucose, Bld: 216 mg/dL — ABNORMAL HIGH (ref 70–99)
Potassium: 3.7 mEq/L (ref 3.5–5.1)

## 2012-12-03 LAB — CBC
HCT: 43 % (ref 39.0–52.0)
Hemoglobin: 15.8 g/dL (ref 13.0–17.0)
MCH: 32.8 pg (ref 26.0–34.0)
MCHC: 36.7 g/dL — ABNORMAL HIGH (ref 30.0–36.0)
MCV: 89.4 fL (ref 78.0–100.0)
RDW: 12.9 % (ref 11.5–15.5)

## 2012-12-03 NOTE — H&P (Signed)
Hospital Admission Note Date: 12/03/2012  Patient name: Riley Jones Medical record number: [id number] Date of birth: 1955-10-30 Age: 57 y.o. Gender: male PCP: Riley Blue, DO  Medical Service: Internal Medicine Teaching Service--[facility]  Attending physician:  Dr. Eben Jones   1st Contact: Dr. Shirlee Jones   [id number] 2nd Contact: Dr. Everardo Jones   [id number] After 5 pm or weekends: 1st Contact:      Pager: [phone] 2nd Contact:      Pager: [phone]  Chief Complaint: Dizziness  History of Present Illness: Riley Jones is a 57 year old white male with HIV well controlled (CD4 count 920 08/2012), chronic tobacco abuse, and well controlled HTN and DM2 presenting to the ED today for complaints of sudden onset dizziness, tinnitus, chest pressure this evening.  He explains that he was standing up and washing his dishes this evening ~7pm after eating dinner when he suddenly heard loud "cricket" like noises in his ears, felt buzzing in his head, started profusely sweating, feeling shaky, became dizzy, and felt like he was going to faint.  He also had the urge to have a BM and stumbled to the bathroom where he had a large solid BM.  Riley Jones also endorses feeling like his tongue was swollen and difficulty making out his words and thoughts, however, his friend who is present in the room right now denies any slurred speech or confusion.  He claims the episode lasted approximately 5 minutes but was still unsteady when he first go to the ED.  He does not recall any vision disturbances and denies any numbness and tingling other than the occasional numbness he has in his RUE and reports being able to move his extremities at all times under his control.  He denies outright chest pain, but claims he feels substernal chest pressure that he has never felt before.  He denied any vomiting, headache, LOC, shortness of breath, abdominal pain, fever, chills, or any urinary complaints.  He denies any similar episodes in the past.   He initially thought he was having a hypoglycemic episode, however his CBG at that time was ~130s.    While in the ED, he still has tinnitus but decreased in volume and feels like most of his symptoms have resolved at this time.  CT head was negative for any hemorrhage, infarction, or mass.    Of note, he had not taken his Lisinopril for approximately all of February and just recently restarted it.  He denies any change in his diet or medications.  He does complain of right ear pain for several weeks and chronic body itching with unknown etiology.  He continues to smoke <1ppd and occasional alcohol but denies illicit drug use.    Meds: Current Outpatient Rx  Name  Route  Sig  Dispense  Refill  . albuterol (VENTOLIN HFA) 108 (90 BASE) MCG/ACT inhaler   Inhalation   Inhale 1-2 puffs into the lungs every 4 (four) hours as needed. for shortness of breath. Or may use every 6 hours as needed.          . ALPRAZolam (XANAX) 0.5 MG tablet   Oral   Take 1 tablet (0.5 mg total) by mouth at bedtime as needed for sleep.   40 tablet   2   . amLODipine (NORVASC) 10 MG tablet   Oral   Take 1 tablet (10 mg total) by mouth daily.   31 tablet   5   . aspirin 81 MG EC tablet   Oral  Take 81 mg by mouth daily.           . diphenhydrAMINE (BENADRYL) 50 MG capsule      TAKE ONE CAPSULE BY MOUTH AT BEDTIME AS NEEDED FOR  ITCHING   30 capsule   0   . doxepin (ZONALON) 5 % cream   Topical   Apply topically 2 (two) times daily as needed for itching.   30 g   0   . efavirenz-emtricitabine-tenofovir (ATRIPLA) 600-200-300 MG per tablet   Oral   Take 1 tablet by mouth daily.   90 tablet   5   . hydrochlorothiazide (MICROZIDE) 12.5 MG capsule   Oral   Take 1 capsule (12.5 mg total) by mouth daily.   30 capsule   4   . HYDROcodone-acetaminophen (VICODIN ES) 7.5-750 MG per tablet   Oral   Take 1 tablet by mouth every 6 (six) hours as needed for pain.   45 tablet   2     Please dispense  Vicodin ES 7.5/300 mg tablets (thi ...   . lisinopril (PRINIVIL,ZESTRIL) 20 MG tablet   Oral   Take 1 tablet (20 mg total) by mouth daily.   30 tablet   2   . metFORMIN (GLUCOPHAGE) 1000 MG tablet   Oral   Take 1 tablet (1,000 mg total) by mouth 2 (two) times daily with a meal.   60 tablet   3   . Multiple Vitamin (MULTIVITAMIN WITH MINERALS) TABS   Oral   Take 1 tablet by mouth daily.         Marland Kitchen omeprazole (PRILOSEC) 20 MG capsule   Oral   Take 1 capsule (20 mg total) by mouth daily.   30 capsule   prn   . pravastatin (PRAVACHOL) 40 MG tablet   Oral   Take 1 tablet (40 mg total) by mouth at bedtime.   90 tablet   3   . terazosin (HYTRIN) 2 MG capsule   Oral   Take 2 capsules (4 mg total) by mouth at bedtime.   60 capsule   5   . triamcinolone (KENALOG) 0.025 % ointment   Topical   Apply 1 application topically 2 (two) times daily as needed (rash).         Marland Kitchen glucose blood (FREESTYLE LITE) test strip   Other   1 each by Other route 2 (two) times daily. Dx code 250.00   100 each   5   . Insulin Pen Needle 31G X 5 MM MISC      Use it for insulin injection   100 each   11   . Insulin Syringe-Needle U-100 (INSULIN SYRINGE .5CC/30GX1/2") 30G X 1/2" 0.5 ML MISC   Does not apply   1 Syringe by Does not apply route Nightly.   100 each   3    Allergies: Allergies as of 12/03/2012 - Review Complete 12/03/2012  Allergen Reaction Noted  . Zoloft (sertraline hcl)  01/28/2012   Past Medical History  Diagnosis Date  . Depression     Stable, off meds.  . Type II diabetes mellitus     Well controlled   . Hypertension     Well Controlled off meds  . HIV (human immunodeficiency virus infection) 1995    DX after shingles followed by Riley Jones (ID)  . Hyperlipidemia   . Rheumatoid arthritis     Followed by Riley Jones, off MTX and prdnisone since 2007  . Atypical angina  Myoview 2003, Normal EF 64%  . Foot pain 03/30/2011   History reviewed. No  pertinent past surgical history. Family History  Problem Relation Age of Onset  . Osteoporosis Mother   . Heart disease Father    History   Social History  . Marital Status: Single    Spouse Name: N/A    Number of Children: N/A  . Years of Education: N/A   Occupational History  . Retired Naval architect    Social History Main Topics  . Smoking status: Current Every Day Smoker -- 1.00 packs/day    Types: Cigarettes  . Smokeless tobacco: Never Used     Comment: ready to quit, has Medicare now  . Alcohol Use: 0.6 oz/week    1 Glasses of wine per week     Comment: rare wine  . Drug Use: No  . Sexually Active: No     Comment: accepted condoms   Other Topics Concern  . Not on file   Social History Narrative   [employer] approved beginning 12/11/2009 - 12/27/2010   Juanell Fairly benefits approved; patient eligible for 100% discount for out patient labs and office visits. Patient eligible for 70% discount for other services per Kandice Robinsons 09/08/2010      Patient is on disability, and walks 2-3 times per week.    Review of Systems: Pertinent items are noted in HPI.  Physical Exam: Blood pressure 126/61, pulse 73, temperature 97.8 F (36.6 C), temperature source Oral, resp. rate 15, height 5\' 9"  (1.753 m), weight 185 lb (83.915 kg), SpO2 95.00%. Vitals reviewed. General: resting in bed, NAD HEENT: PERRL, EOMI, no scleral icterus.  Erythema in b/l ears.  -bulging of TM membrane. +tinnitus.    Neck: erythema of neck above clavicles Cardiac: RRR, no rubs, murmurs or gallops.  Non-tender to touch.   Pulm: clear to auscultation bilaterally, no wheezes, rales, or rhonchi Abd: soft, nontender, nondistended, BS present Ext: warm and well perfused, no pedal edema, +2dp b/l, tenderness to palpation of upper dorsal aspect of right foot, excoriations of RLE Neuro: alert and oriented X3, cranial nerves II-XII grossly intact, strength and sensation to light touch equal in bilateral upper and  lower extremities.  Steady gait and when standing.  Cerebellar testing: delayed fingers to thumb and heel to toe walking (unsteady).  Romberg's negative.  No pronator drift.    Lab results: Basic Metabolic Panel:  Recent Labs  86/57/84 2024  NA 135  K 3.7  CL 97  CO2 24  GLUCOSE 216*  BUN 17  CREATININE 0.79  CALCIUM 9.8   CBC:  Recent Labs  12/03/12 2024  WBC 8.6  HGB 15.8  HCT 43.0  MCV 89.4  PLT 164   Imaging results:  Dg Chest 2 View  12/03/2012  *RADIOLOGY REPORT*  Clinical Data: Left-sided chest pain; left arm numbness, dizziness and head ringing.  Nausea.  History of smoking and asthma.  CHEST - 2 VIEW  Comparison: Chest radiograph performed 01/11/2009  Findings: The lungs are mildly hyperexpanded, with flattening of the hemidiaphragms, raising concern for COPD.  Chronic peribronchial thickening is noted.  There is no evidence of focal opacification, pleural effusion or pneumothorax.  The heart is borderline normal in size; the mediastinal contour is within normal limits.  No acute osseous abnormalities are seen.  IMPRESSION: Chronic peribronchial thickening noted, with underlying findings of COPD; no acute focal airspace consolidation seen.   Original Report Authenticated By: Tonia Ghent, M.D.    Ct Head Wo  Contrast  12/03/2012  *RADIOLOGY REPORT*  Clinical Data:  Dizziness  CT HEAD WITHOUT CONTRAST  Technique:  Contiguous axial images were obtained from the base of the skull through the vertex without contrast.  Comparison: None  Findings: No acute intracranial hemorrhage, acute infarction, mass lesion, mass effect, midline shift or hydrocephalus.  Gray-white differentiation is preserved throughout.  Globes and orbits are unremarkable.  No focal soft tissue or calvarial abnormality. Normal aeration of the mastoid air cells and paranasal sinuses.  IMPRESSION: Negative exam   Original Report Authenticated By: Malachy Moan, M.D.    Other results: EKG: 88NSR  Assessment  & Plan by Problem: Mr. Schara is a 57 year old male with well controlled HIV, HTN, and DM2 admitted for dizziness/light-headed and chest pain.      Pre-syncope--possibly secondary to TIA vs. CVA vs. ACS?  Sudden onset of dizziness, light-headed, loud tinnitus, and unsteady gait this evening.  No prior similar episodes.  CT head negative.  Delayed fingers to thumb cerebellar testing and unstable heel to toe gait on physical exam.  Mild improvement in tinnitus.  In regards to ACS, multiple risk factors include DM2, HTN, and active smoker.  Hx of atypical chest pain but claims this felt different and more pressure like.  No ST-T wave changes on EKG.  Poc troponin negative.  Other differentials also include orthostatic hypotension, however blood pressure seems well controlled, was standing for a while before symptoms, and does not recall any prior similar episodes.  Possible allergic reaction is also considered given recent re-start of ACEi, complaints of tongue swelling and neck erythema.  Hypoglycemia unlikely given cbg in 130s during incident.   -admit to tele -AM EKG -cycle CE -MRI/MRA brain -carotid dopplers -risk stratification: lipid panel, HbA1C -AM labs: cmet, cbc -NPO until swallow screen -UDS -neurochecks q4h -echo -asa -statin -holding all home HTN meds in setting of possible CVA--permissive HTN -orthostatic vital signs    Chest pain--hx of atypical chest pain with description more of substernal non-radiating pressure.  No hx of prior MI.  Followed by Dr. Eden Emms.  Recent nuclear med study 06/20/2012: EF 64%, normal LV function and wall motion.  Multiple risk factors including DM2, HTN, and active smoker.  poc troponin x1 negative.  No ischemic changes on EKG, however ACS cannot be ruled out at this time.  Please see above discussion.   -cycle CE -AM EKG -tele monitoring -ASA -statin -nitrostat prn -benadryl prn    DIABETES MELLITUS, TYPE II--well controlled.  HbA1C 6.9 09/2012.  On  metformin 1000mg  BID at home.   -CBG monitoring -SSI sensitive -hold metformin -f/u HbA1C    HYPERTENSION--well controlled.  On Lisinopril 20mg  qd, Norvasc 10mg  qd, terazosin 4mg  qhs, and HCTZ 12.5mg  QD at home.  Did not take it for all of February due to medication mix-up, resumed early March.   -permissive HTN in setting of possible CVA -hold home meds    Human immunodeficiency virus (HIV) disease--well controlled.  Follows with Dr. Ninetta Lights.  Last CD4 Count 920 December 2013.  On Atripla at home.   -continue Atripla    TOBACCO USER--continues to currently smoke approximately <1ppd which is less than before he claims.   -counseled extensively on smoking cessation, does not seem to have much motivation to quit but claims he is trying -consider nicotine patch   COPD--stable at time of admission.  In setting of chronic ongoing tobacco use.  Smokes <1ppd currently.  PFTs from 09/21/2011: FEV1/FVC 67% actual, FEV1 75% predicted.  GOLD 2, moderate per PFTs.  Moderate airflow limitation and no significant response to bronchodilator.  Hyperinflation of lung volumes with normal diffusion capacity.  On albuterol inhaler at home.  CXR on admission: chronic peribronchial thickening noted with underlying COPD findings.  No acute focal consolidation seen.  -albuterol prn  Diet: NPO until swallow screen DVT Ppx: Lovenox Dispo: Disposition is deferred at this time, awaiting improvement of current medical problems. Anticipated discharge in approximately 1-2 day(s).   The patient does have a current PCP (PAYA, ALEJANDRO, DO), therefore will be requiring OPC follow-up after discharge.   The patient does not have transportation limitations that hinder transportation to clinic appointments.  SignedDarden Palmer 12/03/2012, 11:57 PM

## 2012-12-03 NOTE — ED Provider Notes (Signed)
History     CSN: [id number]  Arrival date & time 12/03/12  2018   First MD Initiated Contact with Patient 12/03/12 2219      Chief Complaint  Patient presents with  . Chest Pain    (Consider location/radiation/quality/duration/timing/severity/associated sxs/prior treatment) HPI Comments: Patient presents with an episode of dizziness that happened about 45 minutes prior to arrival. He states he was washing dishes and had a sudden onset of a spinning sensation and feeling like these were buzzing in his head. He denies any headache. He felt unstable and had difficulty walking. He also states that he had some difficulty getting his words out and some heavy feeling to his tongue. He did have some associated chest pain but he has ongoing chest pain and this is unchanged from his baseline. He did get diaphoretic during this incident.  He denies any numbness or weakness in his extremities. He states he is not currently having more of the dizziness but he did still have some unsteady gait when he was walking into the emergency room. He has not had any further episode of speech difficulties. He denies any past history of strokes. Denies any past history of vertigo. He denies any recent sinus congestion or cold type symptoms. He has a history of HIV disease as his counts are normal.  Patient is a 57 y.o. male presenting with chest pain.  Chest Pain Associated symptoms: dizziness and fatigue   Associated symptoms: no abdominal pain, no back pain, no cough, no diaphoresis, no fever, no headache, no nausea, no numbness, no shortness of breath, not vomiting and no weakness     Past Medical History  Diagnosis Date  . Depression     Stable, off meds.  . Type II diabetes mellitus     Well controlled   . Hypertension     Well Controlled off meds  . 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 Sampson Goon ([place])  . Hyperlipidemia   . Rheumatoid arthritis      Followed by Dr. Kellie Simmering, off MTX and prdnisone since 2007  . Atypical angina     Myoview 2003, Normal EF 64%  . Foot pain 03/30/2011    History reviewed. No pertinent past surgical history.  Family History  Problem Relation Age of Onset  . Osteoporosis Mother   . Heart disease Father     History  Substance Use Topics  . Smoking status: Current Every Day Smoker -- 1.00 packs/day    Types: Cigarettes  . Smokeless tobacco: Never Used     Comment: ready to quit, has Medicare now  . Alcohol Use: 0.6 oz/week    1 Glasses of wine per week     Comment: rare wine      Review of Systems  Constitutional: Positive for fatigue. Negative for fever, chills and diaphoresis.  HENT: Negative for congestion, rhinorrhea and sneezing.   Eyes: Negative.   Respiratory: Negative for cough, chest tightness and shortness of breath.   Cardiovascular: Positive for chest pain. Negative for leg swelling.  Gastrointestinal: Negative for nausea, vomiting, abdominal pain, diarrhea and blood in stool.  Genitourinary: Negative for frequency, hematuria, flank pain and difficulty urinating.  Musculoskeletal: Negative for back pain and arthralgias.  Skin: Negative for rash.  Neurological: Positive for dizziness and speech difficulty. Negative for weakness, numbness and headaches.    Allergies  Zoloft  Home Medications   Current Outpatient Rx  Name  Route  Sig  Dispense  Refill  . albuterol (VENTOLIN HFA) 108 (90 BASE) MCG/ACT inhaler   Inhalation   Inhale 1-2 puffs into the lungs every 4 (four) hours as needed. for shortness of breath. Or may use every 6 hours as needed.          . ALPRAZolam (XANAX) 0.5 MG tablet   Oral   Take 1 tablet (0.5 mg total) by mouth at bedtime as needed for sleep.   40 tablet   2   . amLODipine (NORVASC) 10 MG tablet   Oral   Take 1 tablet (10 mg total) by mouth daily.   31 tablet   5   . aspirin 81 MG EC tablet   Oral   Take 81 mg by mouth daily.            . diphenhydrAMINE (BENADRYL) 50 MG capsule      TAKE ONE CAPSULE BY MOUTH AT BEDTIME AS NEEDED FOR  ITCHING   30 capsule   0   . doxepin (ZONALON) 5 % cream   Topical   Apply topically 2 (two) times daily as needed for itching.   30 g   0   . efavirenz-emtricitabine-tenofovir (ATRIPLA) 600-200-300 MG per tablet   Oral   Take 1 tablet by mouth daily.   90 tablet   5   . hydrochlorothiazide (MICROZIDE) 12.5 MG capsule   Oral   Take 1 capsule (12.5 mg total) by mouth daily.   30 capsule   4   . HYDROcodone-acetaminophen (VICODIN ES) 7.5-750 MG per tablet   Oral   Take 1 tablet by mouth every 6 (six) hours as needed for pain.   45 tablet   2     Please dispense Vicodin ES 7.5/300 mg tablets (thi ...   . lisinopril (PRINIVIL,ZESTRIL) 20 MG tablet   Oral   Take 1 tablet (20 mg total) by mouth daily.   30 tablet   2   . metFORMIN (GLUCOPHAGE) 1000 MG tablet   Oral   Take 1 tablet (1,000 mg total) by mouth 2 (two) times daily with a meal.   60 tablet   3   . Multiple Vitamin (MULTIVITAMIN WITH MINERALS) TABS   Oral   Take 1 tablet by mouth daily.         Marland Kitchen omeprazole (PRILOSEC) 20 MG capsule   Oral   Take 1 capsule (20 mg total) by mouth daily.   30 capsule   prn   . pravastatin (PRAVACHOL) 40 MG tablet   Oral   Take 1 tablet (40 mg total) by mouth at bedtime.   90 tablet   3   . terazosin (HYTRIN) 2 MG capsule   Oral   Take 2 capsules (4 mg total) by mouth at bedtime.   60 capsule   5   . triamcinolone (KENALOG) 0.025 % ointment   Topical   Apply 1 application topically 2 (two) times daily as needed (rash).         Marland Kitchen glucose blood (FREESTYLE LITE) test strip   Other   1 each by Other route 2 (two) times daily. Dx code 250.00   100 each   5   . Insulin Pen Needle 31G X 5 MM MISC      Use it for insulin injection   100 each   11   . Insulin Syringe-Needle U-100 (INSULIN SYRINGE .5CC/30GX1/2") 30G X 1/2" 0.5 ML MISC   Does not apply   1  Syringe by Does not apply route Nightly.   100 each   3     BP 126/61  Pulse 73  Temp(Src) 97.8 F (36.6 C) (Oral)  Resp 15  Ht 5\' 9"  (1.753 m)  Wt 185 lb (83.915 kg)  BMI 27.31 kg/m2  SpO2 95%  Physical Exam  Constitutional: He is oriented to person, place, and time. He appears well-developed and well-nourished.  HENT:  Head: Normocephalic and atraumatic.  Mouth/Throat: Oropharynx is clear and moist.  Eyes: Pupils are equal, round, and reactive to light.  Neck: Normal range of motion. Neck supple.  Cardiovascular: Normal rate, regular rhythm and normal heart sounds.   Pulmonary/Chest: Effort normal and breath sounds normal. No respiratory distress. He has no wheezes. He has no rales. He exhibits no tenderness.  Abdominal: Soft. Bowel sounds are normal. There is no tenderness. There is no rebound and no guarding.  Musculoskeletal: Normal range of motion. He exhibits no edema.  Lymphadenopathy:    He has no cervical adenopathy.  Neurological: He is alert and oriented to person, place, and time. He has normal strength. No cranial nerve deficit or sensory deficit. GCS eye subscore is 4. GCS verbal subscore is 5. GCS motor subscore is 6.  Skin: Skin is warm and dry. No rash noted.  Psychiatric: He has a normal mood and affect.    ED Course  Procedures (including critical care time)  Results for orders placed during the hospital encounter of 12/03/12  CBC      Result Value Range   WBC 8.6  4.0 - 10.5 K/uL   RBC 4.81  4.22 - 5.81 MIL/uL   Hemoglobin 15.8  13.0 - 17.0 g/dL   HCT 16.1  09.6 - 04.5 %   MCV 89.4  78.0 - 100.0 fL   MCH 32.8  26.0 - 34.0 pg   MCHC 36.7 (*) 30.0 - 36.0 g/dL   RDW 40.9  81.1 - 91.4 %   Platelets 164  150 - 400 K/uL  BASIC METABOLIC PANEL      Result Value Range   Sodium 135  135 - 145 mEq/L   Potassium 3.7  3.5 - 5.1 mEq/L   Chloride 97  96 - 112 mEq/L   CO2 24  19 - 32 mEq/L   Glucose, Bld 216 (*) 70 - 99 mg/dL   BUN 17  6 - 23 mg/dL    Creatinine, Ser 7.82  0.50 - 1.35 mg/dL   Calcium 9.8  8.4 - 95.6 mg/dL   GFR calc non Af Amer >90  >90 mL/min   GFR calc Af Amer >90  >90 mL/min  POCT I-STAT TROPONIN I      Result Value Range   Troponin i, poc 0.00  0.00 - 0.08 ng/mL   Comment 3            Dg Chest 2 View  12/03/2012  *RADIOLOGY REPORT*  Clinical Data: Left-sided chest pain; left arm numbness, dizziness and head ringing.  Nausea.  History of smoking and asthma.  CHEST - 2 VIEW  Comparison: Chest radiograph performed 01/11/2009  Findings: The lungs are mildly hyperexpanded, with flattening of the hemidiaphragms, raising concern for COPD.  Chronic peribronchial thickening is noted.  There is no evidence of focal opacification, pleural effusion or pneumothorax.  The heart is borderline normal in size; the mediastinal contour is within normal limits.  No acute osseous abnormalities are seen.  IMPRESSION: Chronic peribronchial thickening noted, with underlying findings of COPD; no  acute focal airspace consolidation seen.   Original Report Authenticated By: Tonia Ghent, M.D.    Ct Head Wo Contrast  12/03/2012  *RADIOLOGY REPORT*  Clinical Data:  Dizziness  CT HEAD WITHOUT CONTRAST  Technique:  Contiguous axial images were obtained from the base of the skull through the vertex without contrast.  Comparison: None  Findings: No acute intracranial hemorrhage, acute infarction, mass lesion, mass effect, midline shift or hydrocephalus.  Gray-white differentiation is preserved throughout.  Globes and orbits are unremarkable.  No focal soft tissue or calvarial abnormality. Normal aeration of the mastoid air cells and paranasal sinuses.  IMPRESSION: Negative exam   Original Report Authenticated By: Malachy Moan, M.D.       Date: 12/03/2012  Rate: 88  Rhythm: normal sinus rhythm  QRS Axis: normal  Intervals: normal  ST/T Wave abnormalities: nonspecific ST/T changes  Conduction Disutrbances:none  Narrative Interpretation:   Old EKG  Reviewed: unchanged     1. Dizziness   2. Aphasia       MDM   Patient presents with dizziness associated with aphasia and gait disturbance. I feel that he needs to be admitted for TIA evaluation. He's asymptomatic now other than having some numbness in his tongue. I discussed the case with the internal medicine teaching service who is coming to evaluate the patient for admission.        Rolan Bucco, MD 12/04/12 [phone]

## 2012-12-03 NOTE — ED Notes (Signed)
Patient states that he was having a funny feeling in his chest ago while washing dishes.  Patient is flush, states that he is feeling dizzy and felt like was going to pass out.

## 2012-12-03 NOTE — ED Notes (Signed)
Patient transported to CT 

## 2012-12-04 DIAGNOSIS — J449 Chronic obstructive pulmonary disease, unspecified: Secondary | ICD-10-CM | POA: Diagnosis not present

## 2012-12-04 DIAGNOSIS — R42 Dizziness and giddiness: Secondary | ICD-10-CM | POA: Diagnosis present

## 2012-12-04 DIAGNOSIS — R55 Syncope and collapse: Secondary | ICD-10-CM | POA: Diagnosis present

## 2012-12-04 LAB — GLUCOSE, CAPILLARY

## 2012-12-04 LAB — RAPID URINE DRUG SCREEN, HOSP PERFORMED
Amphetamines: NOT DETECTED
Benzodiazepines: NOT DETECTED
Cocaine: NOT DETECTED
Opiates: NOT DETECTED
Tetrahydrocannabinol: NOT DETECTED

## 2012-12-04 MED ORDER — TRIAMCINOLONE ACETONIDE 0.1 % EX OINT: 1.0000 "application " | TOPICAL_OINTMENT | Freq: Two times a day (BID) | CUTANEOUS | Status: DC | PRN

## 2012-12-04 MED ORDER — INSULIN ASPART 100 UNIT/ML ~~LOC~~ SOLN
0.0000 [IU] | Freq: Three times a day (TID) | SUBCUTANEOUS | Status: DC
  Administered 2012-12-04: 2 [IU] via SUBCUTANEOUS

## 2012-12-04 MED ORDER — ALPRAZOLAM 0.5 MG PO TABS: 0.5000 mg | ORAL_TABLET | Freq: Every evening | ORAL | Status: DC | PRN

## 2012-12-04 MED ORDER — EFAVIRENZ-EMTRICITAB-TENOFOVIR 600-200-300 MG PO TABS
1.0000 | ORAL_TABLET | Freq: Every day | ORAL | Status: DC
  Filled 2012-12-04: qty 1

## 2012-12-04 MED ORDER — PANTOPRAZOLE SODIUM 40 MG PO TBEC: 40.0000 mg | DELAYED_RELEASE_TABLET | Freq: Every day | ORAL | Status: DC

## 2012-12-04 MED ORDER — ASPIRIN EC 81 MG PO TBEC
81.0000 mg | DELAYED_RELEASE_TABLET | Freq: Every day | ORAL | Status: DC
  Filled 2012-12-04: qty 1

## 2012-12-04 MED ORDER — SIMVASTATIN 20 MG PO TABS
20.0000 mg | ORAL_TABLET | Freq: Every day | ORAL | Status: DC
  Filled 2012-12-04: qty 1

## 2012-12-04 MED ORDER — ACETAMINOPHEN 325 MG PO TABS: 650.0000 mg | ORAL_TABLET | ORAL | Status: DC | PRN

## 2012-12-04 MED ORDER — ENOXAPARIN SODIUM 40 MG/0.4ML ~~LOC~~ SOLN
40.0000 mg | Freq: Every day | SUBCUTANEOUS | Status: DC
  Filled 2012-12-04: qty 0.4

## 2012-12-04 MED ORDER — ALBUTEROL SULFATE HFA 108 (90 BASE) MCG/ACT IN AERS: 1.0000 | INHALATION_SPRAY | RESPIRATORY_TRACT | Status: DC | PRN

## 2012-12-04 MED ORDER — NITROGLYCERIN 0.4 MG SL SUBL: 0.4000 mg | SUBLINGUAL_TABLET | SUBLINGUAL | Status: DC | PRN

## 2012-12-04 NOTE — ED Notes (Signed)
Report received. Pt awaiting  Bed placement

## 2012-12-04 NOTE — H&P (Signed)
Internal Medicine Attending Admission Note Date: 12/04/2012  Patient name: Riley Jones Medical record number: [id number] Date of birth: [birth year] Age: 58 y.o. Gender: male  I saw and evaluated the patient. I reviewed the resident's note and I agree with the resident's findings and plan as documented in the resident's note.  Chief Complaint(s): Dizziness  History - key components related to admission: Riley Jones is a 57 year old male who is well known to me from clinic and has PMH most consistent with HIV well controlled (CD4 count 920 08/2012), chronic tobacco abuse, and well controlled HTN and DM2 presenting to the ED today for complaints of sudden onset dizziness and chest pressure which started on the evening prior to admission. He explains that he was standing up and washing his dishes this evening about 7pm after eating dinner when he suddenly became dizzy, and felt like he was going to faint.  While in the ED CT head was negative for any hemorrhage, infarction, or mass.   Currently patient denied any dizziness and says that it has resolved now.  15 point review of system was positive for tinnitus, presyncope. Rest of the ROS was negative except what is noted above.  PMH, PSH, Medications, social history and medications as per residents note.  Physical Exam - key components related to admission:  Filed Vitals:   12/04/12 0538 12/04/12 1007 12/04/12 1008 12/04/12 1009  BP: 116/62 110/64 116/65 125/61  Pulse: 57 62 66 100  Temp: 97.6 F (36.4 C)     TempSrc: Oral     Resp: 18     Height: 5\' 9"  (1.753 m)     Weight: 195 lb (88.451 kg)     SpO2: 99%     Physical Exam: General: Vital signs reviewed and noted. Well-developed, well-nourished, in no acute distress; alert, appropriate and cooperative throughout examination.  Head: Normocephalic, atraumatic.  Eyes: PERRL, EOMI, No signs of anemia or jaundince.  Nose: Mucous membranes moist, not inflammed, nonerythematous.  Throat:  Oropharynx nonerythematous, no exudate appreciated.   Neck: No deformities, masses, or tenderness noted.Supple, No carotid Bruits, no JVD.  Lungs:  Normal respiratory effort. Clear to auscultation BL without crackles or wheezes.  Heart: RRR. S1 and S2 normal without gallop, murmur, or rubs.  Abdomen:  BS normoactive. Soft, Nondistended, non-tender.  No masses or organomegaly.  Extremities: No pretibial edema.  Neurologic: A&O X3, CN II - XII are grossly intact. Motor strength is 5/5 in the all 4 extremities, Sensations intact to light touch, Cerebellar signs negative. Patient's gait was normal  Skin: No visible rashes, scars.    Lab results:  Basic Metabolic Panel:  Recent Labs  40/98/11 2024  NA 135  K 3.7  CL 97  CO2 24  GLUCOSE 216*  BUN 17  CREATININE 0.79  CALCIUM 9.8   CBC:  Recent Labs  12/03/12 2024  WBC 8.6  HGB 15.8  HCT 43.0  MCV 89.4  PLT 164   Cardiac Enzymes:  Recent Labs  12/04/12 0430  TROPONINI <0.30   BNP: No components found with this basename: POCBNP,  D-Dimer: No results found for this basename: DDIMER,  in the last 72 hours CBG:  Recent Labs  12/04/12 0804  GLUCAP 155*   Imaging results:  Dg Chest 2 View  12/03/2012  *RADIOLOGY REPORT*  Clinical Data: Left-sided chest pain; left arm numbness, dizziness and head ringing.  Nausea.  History of smoking and asthma.  CHEST - 2 VIEW  Comparison: Chest radiograph  performed 01/11/2009  Findings: The lungs are mildly hyperexpanded, with flattening of the hemidiaphragms, raising concern for COPD.  Chronic peribronchial thickening is noted.  There is no evidence of focal opacification, pleural effusion or pneumothorax.  The heart is borderline normal in size; the mediastinal contour is within normal limits.  No acute osseous abnormalities are seen.  IMPRESSION: Chronic peribronchial thickening noted, with underlying findings of COPD; no acute focal airspace consolidation seen.   Original Report  Authenticated By: Tonia Ghent, M.D.    Ct Head Wo Contrast  12/03/2012  *RADIOLOGY REPORT*  Clinical Data:  Dizziness  CT HEAD WITHOUT CONTRAST  Technique:  Contiguous axial images were obtained from the base of the skull through the vertex without contrast.  Comparison: None  Findings: No acute intracranial hemorrhage, acute infarction, mass lesion, mass effect, midline shift or hydrocephalus.  Gray-white differentiation is preserved throughout.  Globes and orbits are unremarkable.  No focal soft tissue or calvarial abnormality. Normal aeration of the mastoid air cells and paranasal sinuses.  IMPRESSION: Negative exam   Original Report Authenticated By: Malachy Moan, M.D.     Other results: EKG: 88 bpm, normal axis, no st or t wave changes noted  Assessment & Plan by Problem:  Principal Problem:   Near syncope Active Problems:   Human immunodeficiency virus (HIV) disease   DIABETES MELLITUS, TYPE II   TOBACCO USER   HYPERTENSION   Chest pain   Dizziness   COPD (chronic obstructive pulmonary disease)  Riley Jones is a 57 year old male with well controlled HIV, HTN, and DM2 admitted for dizziness/light-headed. The differentials at this time includes cardiac causes like arrhythmia, ACS, posterior circulation stroke or TIA. -negative troponins x 3 since admission rules out ACS -telemetry has not shown any arrhythmias so far -Patient's dizziness is completely resolved at this time as evidenced by his gait and negative romberg which we elicited at the time of history taking which rules out stroke in posterior circulation -orthostatic vital were checked -Patient to control his LDL, on aspirin, bp control, not a smoker and lifestyle change -I believe that patient can be safely discharged home at this time with outpatient follow up    Lars Mage MD Faculty-Internal Medicine Residency Program Pager [phone]

## 2012-12-04 NOTE — Discharge Summary (Signed)
Patient Name:  Riley Jones MRN: [id number]  PCP: Jonah Blue, DO DOB:  12-23-1955       Date of Admission:  12/03/2012  Date of Discharge:  12/04/2012      Attending Physician: Dr. Lars Mage, MD         DISCHARGE DIAGNOSES: Pre-syncope Chest pain Anxiety/depression Type 2 diabetes mellitus Hypertension Human immunodeficiency virus (HIV) disease Tobacco abuse COPD    DISPOSITION AND FOLLOW-UP: MICAL KICKLIGHTER is to follow-up with the listed providers as detailed below, at which time, the following should be addressed:   1. F/u on patient's complaints of dizziness/lightheadedness and assess for resolution. 2. Refer patient to Dr. Katha Hamming for management of anxiety/depression. 3. Labs / imaging needed: N/A 4. Pending labs/ test needing follow-up: N/A      Follow-up Information   Schedule an appointment as soon as possible for a visit with PAYA, ALEJANDRO, DO.   Contact information:   [address] [place] [address] [phone]        DISCHARGE MEDICATIONS:   Medication List    STOP taking these medications       lisinopril 20 MG tablet  Commonly known as:  PRINIVIL,ZESTRIL      TAKE these medications       ALPRAZolam 0.5 MG tablet  Commonly known as:  XANAX  Take 1 tablet (0.5 mg total) by mouth at bedtime as needed for sleep.     amLODipine 10 MG tablet  Commonly known as:  NORVASC  Take 1 tablet (10 mg total) by mouth daily.     aspirin 81 MG EC tablet  Take 81 mg by mouth daily.     diphenhydrAMINE 50 MG capsule  Commonly known as:  BENADRYL  TAKE ONE CAPSULE BY MOUTH AT BEDTIME AS NEEDED FOR  ITCHING     doxepin 5 % cream  Commonly known as:  ZONALON  Apply topically 2 (two) times daily as needed for itching.     efavirenz-emtricitabine-tenofovir 600-200-300 MG per tablet  Commonly known as:  ATRIPLA  Take 1 tablet by mouth daily.     glucose blood test strip  Commonly known as:  FREESTYLE LITE  1 each by Other route 2 (two)  times daily. Dx code 250.00     hydrochlorothiazide 12.5 MG capsule  Commonly known as:  MICROZIDE  Take 1 capsule (12.5 mg total) by mouth daily.     HYDROcodone-acetaminophen 7.5-750 MG per tablet  Commonly known as:  VICODIN ES  Take 1 tablet by mouth every 6 (six) hours as needed for pain.     Insulin Pen Needle 31G X 5 MM Misc  Use it for insulin injection     INSULIN SYRINGE .5CC/30GX1/2" 30G X 1/2" 0.5 ML Misc  1 Syringe by Does not apply route Nightly.     metFORMIN 1000 MG tablet  Commonly known as:  GLUCOPHAGE  Take 1 tablet (1,000 mg total) by mouth 2 (two) times daily with a meal.     multivitamin with minerals Tabs  Take 1 tablet by mouth daily.     omeprazole 20 MG capsule  Commonly known as:  PRILOSEC  Take 1 capsule (20 mg total) by mouth daily.     pravastatin 40 MG tablet  Commonly known as:  PRAVACHOL  Take 1 tablet (40 mg total) by mouth at bedtime.     terazosin 2 MG capsule  Commonly known as:  HYTRIN  Take 2 capsules (4 mg  total) by mouth at bedtime.     triamcinolone 0.025 % ointment  Commonly known as:  KENALOG  Apply 1 application topically 2 (two) times daily as needed (rash).     VENTOLIN HFA 108 (90 BASE) MCG/ACT inhaler  Generic drug:  albuterol  Inhale 1-2 puffs into the lungs every 4 (four) hours as needed. for shortness of breath. Or may use every 6 hours as needed.         CONSULTS:       PROCEDURES PERFORMED:  Dg Chest 2 View  12/03/2012  *RADIOLOGY REPORT*  Clinical Data: Left-sided chest pain; left arm numbness, dizziness and head ringing.  Nausea.  History of smoking and asthma.  CHEST - 2 VIEW  Comparison: Chest radiograph performed 01/11/2009  Findings: The lungs are mildly hyperexpanded, with flattening of the hemidiaphragms, raising concern for COPD.  Chronic peribronchial thickening is noted.  There is no evidence of focal opacification, pleural effusion or pneumothorax.  The heart is borderline normal in size; the  mediastinal contour is within normal limits.  No acute osseous abnormalities are seen.  IMPRESSION: Chronic peribronchial thickening noted, with underlying findings of COPD; no acute focal airspace consolidation seen.   Original Report Authenticated By: Tonia Ghent, M.D.    Ct Head Wo Contrast  12/03/2012  *RADIOLOGY REPORT*  Clinical Data:  Dizziness  CT HEAD WITHOUT CONTRAST  Technique:  Contiguous axial images were obtained from the base of the skull through the vertex without contrast.  Comparison: None  Findings: No acute intracranial hemorrhage, acute infarction, mass lesion, mass effect, midline shift or hydrocephalus.  Gray-white differentiation is preserved throughout.  Globes and orbits are unremarkable.  No focal soft tissue or calvarial abnormality. Normal aeration of the mastoid air cells and paranasal sinuses.  IMPRESSION: Negative exam   Original Report Authenticated By: Malachy Moan, M.D.        ADMISSION DATA: H&P: Riley Jones is a 57 year old white male with HIV well controlled (CD4 count 920 08/2012), chronic tobacco abuse, and well controlled HTN and DM2 presenting to the ED today for complaints of sudden onset dizziness, tinnitus, chest pressure this evening. He explains that he was standing up and washing his dishes this evening ~7pm after eating dinner when he suddenly heard loud "cricket" like noises in his ears, felt buzzing in his head, started profusely sweating, feeling shaky, became dizzy, and felt like he was going to faint. He also had the urge to have a BM and stumbled to the bathroom where he had a large solid BM. Riley Jones also endorses feeling like his tongue was swollen and difficulty making out his words and thoughts, however, his friend who is present in the room right now denies any slurred speech or confusion. He claims the episode lasted approximately 5 minutes but was still unsteady when he first go to the ED. He does not recall any vision disturbances and  denies any numbness and tingling other than the occasional numbness he has in his RUE and reports being able to move his extremities at all times under his control. He denies outright chest pain, but claims he feels substernal chest pressure that he has never felt before. He denied any vomiting, headache, LOC, shortness of breath, abdominal pain, fever, chills, or any urinary complaints. He denies any similar episodes in the past. He initially thought he was having a hypoglycemic episode, however his CBG at that time was ~130s.  While in the ED, he still has tinnitus but  decreased in volume and feels like most of his symptoms have resolved at this time. CT head was negative for any hemorrhage, infarction, or mass.  Of note, he had not taken his Lisinopril for approximately all of February and just recently restarted it. He denies any change in his diet or medications. He does complain of right ear pain for several weeks and chronic body itching with unknown etiology. He continues to smoke <1ppd and occasional alcohol but denies illicit drug use.   Physical Exam: Blood pressure 126/61, pulse 73, temperature 97.8 F (36.6 C), temperature source Oral, resp. rate 15, height 5\' 9"  (1.753 m), weight 185 lb (83.915 kg), SpO2 95.00%.  Vitals reviewed.  General: resting in bed, NAD  HEENT: PERRL, EOMI, no scleral icterus. Erythema in b/l ears. -bulging of TM membrane. +tinnitus.  Neck: erythema of neck above clavicles  Cardiac: RRR, no rubs, murmurs or gallops. Non-tender to touch.  Pulm: clear to auscultation bilaterally, no wheezes, rales, or rhonchi  Abd: soft, nontender, nondistended, BS present  Ext: warm and well perfused, no pedal edema, +2dp b/l, tenderness to palpation of upper dorsal aspect of right foot, excoriations of RLE  Neuro: alert and oriented X3, cranial nerves II-XII grossly intact, strength and sensation to light touch equal in bilateral upper and lower extremities. Steady gait and when  standing. Cerebellar testing: delayed fingers to thumb and heel to toe walking (unsteady). Romberg's negative. No pronator drift.    Labs: Basic Metabolic Panel:   Recent Labs   12/03/12 2024   NA  135   K  3.7   CL  97   CO2  24   GLUCOSE  216*   BUN  17   CREATININE  0.79   CALCIUM  9.8    CBC:   Recent Labs   12/03/12 2024   WBC  8.6   HGB  15.8   HCT  43.0   MCV  89.4   PLT  164      HOSPITAL COURSE: Dizziness--Sudden onset of dizziness and tinnitus which all resolved after admission. No prior similar episodes. CT head negative. Most likely secondary to restarting lisinopril, as this medication is notorious for causing dizziness and/or hypotension.  TIA unlikely, and patient is already on statin and ASA therapy. Patient states he was very nervous about these symptoms and has a history of significant anxiety (for which he takes xanax), thus anxiety may have contributed to this issue. Symptoms resolved soon after admission. Patient was instructed to f/u with the Internal medicine OPC.  Chest pain--hx of atypical chest pain with description more of substernal non-radiating pressure. No hx of prior MI. Followed by Dr. Eden Emms. Recent nuclear med study 06/20/2012: EF 64%, normal LV function and wall motion. No ischemic changes on EKG. CP resolved after admission, and troponin was negative x 2.  Anxiety/depression - high on differential regarding etiology of aforementioned complaints. The day of admission was apparently the anniversary of his sister's death, and he has been very anxious recently. He would benefit from referral to Dr. Katha Hamming at time of hospital follow-up for further management of his anxiety and depression (pt has tried many other psychiatrists previously, but wants to speak with someone new who might focus more on psychotherapy/discussion).   DIABETES MELLITUS, TYPE II--well controlled. HbA1C 6.9 09/2012. On metformin 1000mg  BID at home, which was resumed at  discharge.   HYPERTENSION--well controlled. On Lisinopril 20mg  qd, Norvasc 10mg  qd, terazosin 4mg  qhs, and HCTZ 12.5mg  QD at home.  Did not take it for all of February due to medication mix-up, resumed early March. Lisinopril held at discharge, secondary to concerns for causing dizziness, per above. AntiHTN meds otherwise resumed.   Human immunodeficiency virus (HIV) disease--well controlled. Follows with Dr. Ninetta Lights. Last CD4 Count 920 December 2013. Stable on atripla.   TOBACCO USER--continues to currently smoke approximately <1ppd which is less than before he claims. Counseled extensively on smoking cessation, does not seem to have much motivation to quit but claims he is trying  -consider nicotine patch   COPD--stable.    DISCHARGE DATA: Vital Signs: BP 116/62  Pulse 57  Temp(Src) 97.6 F (36.4 C) (Oral)  Resp 18  Ht 5\' 9"  (1.753 m)  Wt 195 lb (88.451 kg)  BMI 28.78 kg/m2  SpO2 99%  Labs: Results for orders placed during the hospital encounter of 12/03/12 (from the past 24 hour(s))  CBC     Status: Abnormal   Collection Time    12/03/12  8:24 PM      Result Value Range   WBC 8.6  4.0 - 10.5 K/uL   RBC 4.81  4.22 - 5.81 MIL/uL   Hemoglobin 15.8  13.0 - 17.0 g/dL   HCT 47.8  29.5 - 62.1 %   MCV 89.4  78.0 - 100.0 fL   MCH 32.8  26.0 - 34.0 pg   MCHC 36.7 (*) 30.0 - 36.0 g/dL   RDW 30.8  65.7 - 84.6 %   Platelets 164  150 - 400 K/uL  BASIC METABOLIC PANEL     Status: Abnormal   Collection Time    12/03/12  8:24 PM      Result Value Range   Sodium 135  135 - 145 mEq/L   Potassium 3.7  3.5 - 5.1 mEq/L   Chloride 97  96 - 112 mEq/L   CO2 24  19 - 32 mEq/L   Glucose, Bld 216 (*) 70 - 99 mg/dL   BUN 17  6 - 23 mg/dL   Creatinine, Ser 9.62  0.50 - 1.35 mg/dL   Calcium 9.8  8.4 - 95.2 mg/dL   GFR calc non Af Amer >90  >90 mL/min   GFR calc Af Amer >90  >90 mL/min  POCT I-STAT TROPONIN I     Status: None   Collection Time    12/03/12  8:45 PM      Result Value Range     Troponin i, poc 0.00  0.00 - 0.08 ng/mL   Comment 3           TROPONIN I     Status: None   Collection Time    12/04/12  4:30 AM      Result Value Range   Troponin I <0.30  <0.30 ng/mL  GLUCOSE, CAPILLARY     Status: Abnormal   Collection Time    12/04/12  8:04 AM      Result Value Range   Glucose-Capillary 155 (*) 70 - 99 mg/dL   Comment 1 Notify RN     Comment 2 Documented in Chart       Time spent on discharge: 25 minutes  Services Ordered on Discharge: Y = Yes; Blank = No PT:   OT:   RN:   Equipment:   Other:     Signed: Elfredia Nevins, MD   PGY 1, Internal Medicine Resident 12/04/2012, 10:02 AM

## 2012-12-05 LAB — HEMOGLOBIN A1C
Hgb A1c MFr Bld: 7.1 % — ABNORMAL HIGH (ref <5.7)
Mean Plasma Glucose: 157 mg/dL — ABNORMAL HIGH (ref <117)

## 2012-12-06 ENCOUNTER — Ambulatory Visit: Payer: Medicare HMO

## 2012-12-13 ENCOUNTER — Ambulatory Visit: Payer: Medicare HMO | Admitting: Internal Medicine

## 2012-12-15 ENCOUNTER — Ambulatory Visit (INDEPENDENT_AMBULATORY_CARE_PROVIDER_SITE_OTHER): Payer: Medicare HMO | Admitting: Internal Medicine

## 2012-12-15 ENCOUNTER — Encounter: Payer: Self-pay | Admitting: Internal Medicine

## 2012-12-15 VITALS — BP 132/79 | HR 70 | Temp 97.4°F | Ht 69.0 in | Wt 195.7 lb

## 2012-12-15 DIAGNOSIS — E119 Type 2 diabetes mellitus without complications: Secondary | ICD-10-CM

## 2012-12-15 DIAGNOSIS — I10 Essential (primary) hypertension: Secondary | ICD-10-CM

## 2012-12-15 DIAGNOSIS — R42 Dizziness and giddiness: Secondary | ICD-10-CM | POA: Insufficient documentation

## 2012-12-15 DIAGNOSIS — I871 Compression of vein: Secondary | ICD-10-CM

## 2012-12-15 DIAGNOSIS — Z0000 Encounter for general adult medical examination without abnormal findings: Secondary | ICD-10-CM | POA: Insufficient documentation

## 2012-12-15 DIAGNOSIS — F419 Anxiety disorder, unspecified: Secondary | ICD-10-CM | POA: Insufficient documentation

## 2012-12-15 DIAGNOSIS — F411 Generalized anxiety disorder: Secondary | ICD-10-CM

## 2012-12-15 DIAGNOSIS — F17200 Nicotine dependence, unspecified, uncomplicated: Secondary | ICD-10-CM

## 2012-12-15 DIAGNOSIS — L539 Erythematous condition, unspecified: Secondary | ICD-10-CM | POA: Insufficient documentation

## 2012-12-15 LAB — CBC WITH DIFFERENTIAL/PLATELET
Eosinophils Absolute: 0.2 10*3/uL (ref 0.0–0.7)
Eosinophils Relative: 3 % (ref 0–5)
HCT: 42.8 % (ref 39.0–52.0)
Hemoglobin: 15.5 g/dL (ref 13.0–17.0)
Lymphs Abs: 2.5 10*3/uL (ref 0.7–4.0)
MCH: 32.2 pg (ref 26.0–34.0)
MCHC: 36.2 g/dL — ABNORMAL HIGH (ref 30.0–36.0)
MCV: 89 fL (ref 78.0–100.0)
Monocytes Absolute: 0.6 10*3/uL (ref 0.1–1.0)
Monocytes Relative: 7 % (ref 3–12)
RBC: 4.81 MIL/uL (ref 4.22–5.81)

## 2012-12-15 LAB — GLUCOSE, CAPILLARY: Glucose-Capillary: 131 mg/dL — ABNORMAL HIGH (ref 70–99)

## 2012-12-15 NOTE — Assessment & Plan Note (Signed)
Chronic smoker.  Marked facial redness extending to neck and upper chest.  Non-blanchable.  Is not out in the sun, usually indoors.  Denies itching.    ?SVC syndrome -repeat cbc with diff -follow up CT chest with contrast -smoking cessation strongly advised

## 2012-12-15 NOTE — Assessment & Plan Note (Signed)
Much improved since hospital admission.  Possibly secondary to anxiety as well.  CT head negative 12/03/12.  Thought to be secondary to restarting lisinopril at the end of February, but now discontinued since hospital discharge.    -continue to monitor

## 2012-12-15 NOTE — Assessment & Plan Note (Signed)
BP Readings from Last 3 Encounters:  12/15/12 132/79  12/04/12 125/61  10/18/12 122/71   Lab Results  Component Value Date   NA 135 12/03/2012   K 3.7 12/03/2012   CREATININE 0.79 12/03/2012    Assessment:  Blood pressure control: controlled  Progress toward BP goal:  at goal  Plan:  Medications:  continue current medications on norvasc 10mg  and microzide 12.5mg  qd.  Stopped lisinopril in hospital for complaints of light-headedness  Educational resources provided: brochure  Self management tools provided: home blood pressure logbook  Other plans: continue to monitor

## 2012-12-15 NOTE — Assessment & Plan Note (Signed)
Claims to have gotten a colonoscopy by Dr. Elnoria Howard end of November or December 2013 with one polyp removed and to return in 5 years.  We needs records of this on file.    He see's an outside ophthalmologist and will make an appointment soon to get his eyes checked  We collected urine for microalbumin:cr ration today

## 2012-12-15 NOTE — Assessment & Plan Note (Addendum)
On xanax prn.  Gets anxious at times and easily cries.  Denies any suicidal or homicidal ideation.  Would like to see psychiatrist but needs to check which one his insurance will cover.  Inpatient service recommended Dr. Donell Beers.    -gave information for Dr. Caprice Renshaw office and he will call to see if he can be seen there -also told him about [facility], he thinks he has been there and did not like it -has good support system with his friends

## 2012-12-15 NOTE — Assessment & Plan Note (Signed)
Strongly encouraged to stop smoking. Is down to <1/2pack per day.  Claims to have had allergic reaction with skin rash to patches.  Does not like gum.  Will try support classes. Not completely ready to quit at this time.    -smoking cessation counseling strongly encouraged and advised today and during hospital course -[phone]  Assessment:  Progress toward smoking cessation:  smoking less  Barriers to progress toward smoking cessation:  lack of motivation to quit Plan:  Instruction/counseling given:  I counseled patient on the dangers of tobacco use and advised patient to stop smoking.  Educational resources provided:  [employer]line[employer]) brochure  Self management tools provided:  smoking cessation plan (STAR Quit Plan)  Medications to assist with smoking cessation:  none Patient agreed to the following self-care plans for smoking cessation:      Other: continue to discuss cessation

## 2012-12-15 NOTE — Patient Instructions (Addendum)
  General Instructions:  Please get your chest CT scheduled  Please follow up with your ophthalmologist for your annual eye exam  Please STOP SMOKING--call [phone] for more assistance  Treatment Goals:  Goals (1 Years of Data) as of 12/15/12         As of Today 12/04/12 12/04/12 12/04/12 12/04/12     Blood Pressure    . Blood Pressure < 140/90  132/79 125/61 116/65 110/64 116/62     Diet    . Reduce fat intake by changing to 1% milk           Result Component    . HEMOGLOBIN A1C < 7.0   7.1       . LDL CALC < 100           Progress Toward Treatment Goals:  Treatment Goal 12/15/2012  Hemoglobin A1C deteriorated  Blood pressure at goal  Stop smoking smoking less   Self Care Goals & Plans:  Self Care Goal 12/15/2012  Manage my medications bring my medications to every visit; take my medicines as prescribed  Monitor my health bring my glucose meter and log to each visit  Eat healthy foods -  Be physically active -    Home Blood Glucose Monitoring 12/15/2012  Check my blood sugar once a day   Care Management & Community Referrals: [facility]: Archer Asa MD   [address] [place], [place], [place] [address] [phone]  [email or web address]  Please call Dr. Caprice Renshaw office and let us know if your insurance will cover it    You can also try going to [facility] for psychiatry services

## 2012-12-15 NOTE — Progress Notes (Signed)
Subjective:   Patient ID: Riley Jones male   DOB: Feb 17, 1956 57 y.o.   MRN: [id number]  HPI: [name] is a 56 y.o. male with PMH of well controlled HIV, DM2, and HTN and anxiety presenting to clinic today for hospital follow up visit.  [name] was recently admitted to [facility] 12/03/12 and discharged on 12/04/12 for pre-syncope and sudden onset substernal chest pain.   During admission: CT head was negative and his dizziness was thought possibly secondary to restarting lisinopril which was held on discharge.  ACS was essentially ruled out in regards to his chest pain and may be more anxiety related as well as day of admission was anniversary of his sister's death.    Otherwise, [name] claims to be feeling much better since discharge, much improved light-headedness and mild substernal localized chest pain that is non-radiating.  He continue to take xanax prn and smoke <1/2 ppd but is not ready to quit at this time.  He is interested to see a psychiatrist but needs to clear it with his insurance first.      Past Medical History  Diagnosis Date  . Depression     Stable, off meds.  . Type II diabetes mellitus     Well controlled   . Hypertension     Well Controlled off meds  . 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 Sampson Goon ([place])  . Hyperlipidemia   . Rheumatoid arthritis     Followed by Dr. Kellie Simmering, off MTX and prdnisone since 2007  . Atypical angina     Myoview 2003, Normal EF 64%  . Foot pain 03/30/2011   Current Outpatient Prescriptions  Medication Sig Dispense Refill  . albuterol (VENTOLIN HFA) 108 (90 BASE) MCG/ACT inhaler Inhale 1-2 puffs into the lungs every 4 (four) hours as needed. for shortness of breath. Or may use every 6 hours as needed.       . ALPRAZolam (XANAX) 0.5 MG tablet Take 1 tablet (0.5 mg total) by mouth at bedtime as needed for sleep.  40 tablet  2  . amLODipine (NORVASC) 10 MG tablet Take 1 tablet (10 mg  total) by mouth daily.  31 tablet  5  . aspirin 81 MG EC tablet Take 81 mg by mouth daily.        . diphenhydrAMINE (BENADRYL) 50 MG capsule TAKE ONE CAPSULE BY MOUTH AT BEDTIME AS NEEDED FOR  ITCHING  30 capsule  0  . doxepin (ZONALON) 5 % cream Apply topically 2 (two) times daily as needed for itching.  30 g  0  . efavirenz-emtricitabine-tenofovir (ATRIPLA) 600-200-300 MG per tablet Take 1 tablet by mouth daily.  90 tablet  5  . glucose blood (FREESTYLE LITE) test strip 1 each by Other route 2 (two) times daily. Dx code 250.00  100 each  5  . hydrochlorothiazide (MICROZIDE) 12.5 MG capsule Take 1 capsule (12.5 mg total) by mouth daily.  30 capsule  4  . HYDROcodone-acetaminophen (VICODIN ES) 7.5-750 MG per tablet Take 1 tablet by mouth every 6 (six) hours as needed for pain.  45 tablet  2  . Insulin Pen Needle 31G X 5 MM MISC Use it for insulin injection  100 each  11  . Insulin Syringe-Needle U-100 (INSULIN SYRINGE .5CC/30GX1/2") 30G X 1/2" 0.5 ML MISC 1 Syringe by Does not apply route Nightly.  100 each  3  . metFORMIN (GLUCOPHAGE) 1000 MG tablet Take 1  tablet (1,000 mg total) by mouth 2 (two) times daily with a meal.  60 tablet  3  . Multiple Vitamin (MULTIVITAMIN WITH MINERALS) TABS Take 1 tablet by mouth daily.      Marland Kitchen omeprazole (PRILOSEC) 20 MG capsule Take 1 capsule (20 mg total) by mouth daily.  30 capsule  prn  . pravastatin (PRAVACHOL) 40 MG tablet Take 1 tablet (40 mg total) by mouth at bedtime.  90 tablet  3  . terazosin (HYTRIN) 2 MG capsule Take 2 capsules (4 mg total) by mouth at bedtime.  60 capsule  5  . triamcinolone (KENALOG) 0.025 % ointment Apply 1 application topically 2 (two) times daily as needed (rash).       No current facility-administered medications for this visit.   Family History  Problem Relation Age of Onset  . Osteoporosis Mother   . Heart disease Father    History   Social History  . Marital Status: Single    Spouse Name: N/A    Number of Children:  N/A  . Years of Education: N/A   Occupational History  . Retired Naval architect    Social History Main Topics  . Smoking status: Current Every Day Smoker -- 1.00 packs/day    Types: Cigarettes  . Smokeless tobacco: Never Used     Comment: ready to quit, has Medicare now  . Alcohol Use: 0.6 oz/week    1 Glasses of wine per week     Comment: occasional wine  . Drug Use: No  . Sexually Active: Not on file   Other Topics Concern  . Not on file   Social History Narrative   [employer] approved beginning 12/11/2009 - 12/27/2010   Juanell Fairly benefits approved; patient eligible for 100% discount for out patient labs and office visits. Patient eligible for 70% discount for other services per Kandice Robinsons 09/08/2010      Patient is on disability, and walks 2-3 times per week.    Review of Systems: Constitutional: Denies fever, chills, diaphoresis, appetite change and fatigue.  HEENT: Denies photophobia, eye pain, redness, hearing loss, ear pain, congestion, sore throat, rhinorrhea, sneezing, mouth sores, trouble swallowing, neck pain, neck stiffness and tinnitus.   Respiratory: Denies SOB, DOE, cough, chest tightness,  and wheezing.   Cardiovascular: Denies chest pain, palpitations and leg swelling.  Gastrointestinal: Denies nausea, vomiting, abdominal pain, diarrhea, constipation, blood in stool and abdominal distention.  Genitourinary: Denies dysuria, urgency, frequency, hematuria, flank pain and difficulty urinating.  Musculoskeletal: Denies myalgias, back pain, joint swelling, arthralgias and gait problem.  Skin: facial and neck redness extending to top of chest.  Denies pallor, rash and wound.  Neurological:  light-headedness.  Denies dizziness, seizures, syncope, weakness, numbness and headaches.  Hematological: Denies adenopathy. Easy bruising, personal or family bleeding history  Psychiatric/Behavioral: Anxiety.  Denies suicidal ideation, mood changes, confusion, nervousness, sleep  disturbance and agitation  Objective:  Physical Exam: Filed Vitals:   12/15/12 1537  BP: 132/79  Pulse: 70  Temp: 97.4 F (36.3 C)  TempSrc: Oral  Height: 5\' 9"  (1.753 m)  Weight: 195 lb 11.2 oz (88.769 kg)  SpO2: 97%   Constitutional: Vital signs reviewed.  Patient is a well-developed and well-nourished male in no acute distress and cooperative with exam. Alert and oriented x3.  Head: Normocephalic and atraumatic Ear: TM normal bilaterally Mouth: no erythema or exudates, MMM Eyes: PERRL, EOMI, conjunctivae normal, No scleral icterus.  Neck: Supple, Trachea midline normal ROM, No JVD Cardiovascular: RRR, S1 normal,  S2 normal, no MRG, pulses symmetric and intact bilaterally Pulmonary/Chest: CTAB, no wheezes, rales, or rhonchi Abdominal: Soft. Non-tender, distended, bowel sounds are normal, or guarding present.  GU: no CVA tenderness Musculoskeletal: No joint deformities, erythema, or stiffness, ROM full and no nontender Neurological: A&O x3, Strength is normal and symmetric bilaterally, cranial nerve II-XII are grossly intact, no focal motor deficit, sensory intact to light touch bilaterally.  Skin: Warm, dry and intact. Facial redness extending down neck to upper chest.  Non blanchable.   No rash, cyanosis, or clubbing.  Psychiatric: Normal mood and affect. speech and behavior is normal. Judgment and thought content normal. Cognition and memory are normal.   Assessment & Plan:  Discussed with Dr. Kem Kays  Facial redness--?svc syndrome in smoker -f/u CT chest and cbc

## 2012-12-15 NOTE — Assessment & Plan Note (Signed)
Lab Results  Component Value Date   HGBA1C 7.1* 12/04/2012   HGBA1C 6.9 10/18/2012   HGBA1C 7.2 06/09/2012    Assessment:  Diabetes control: good control (HgbA1C at goal)  Progress toward A1C goal:  deteriorated  Comments: hba1c 7.1, slightly elevated.  Can improve with diet modifications.  Adherent with medication  Plan:  Medications:  continue current medications on metformin 1000mg  po bid  Home glucose monitoring:   Frequency: once a day   Timing:    Instruction/counseling given: reminded to get eye exam, reminded to bring blood glucose meter & log to each visit, reminded to bring medications to each visit, discussed foot care, discussed the need for weight loss and discussed diet  Educational resources provided: brochure  Self management tools provided: copy of home glucose meter download

## 2012-12-22 ENCOUNTER — Other Ambulatory Visit: Payer: Self-pay | Admitting: Internal Medicine

## 2012-12-22 ENCOUNTER — Telehealth: Payer: Self-pay | Admitting: *Deleted

## 2012-12-22 DIAGNOSIS — I10 Essential (primary) hypertension: Secondary | ICD-10-CM

## 2012-12-22 DIAGNOSIS — E611 Iron deficiency: Secondary | ICD-10-CM

## 2012-12-22 DIAGNOSIS — E785 Hyperlipidemia, unspecified: Secondary | ICD-10-CM

## 2012-12-22 DIAGNOSIS — M069 Rheumatoid arthritis, unspecified: Secondary | ICD-10-CM

## 2012-12-22 DIAGNOSIS — E119 Type 2 diabetes mellitus without complications: Secondary | ICD-10-CM

## 2012-12-22 MED ORDER — HYDROCODONE-ACETAMINOPHEN 7.5-750 MG PO TABS: 1.0000 | ORAL_TABLET | Freq: Four times a day (QID) | ORAL | Status: DC | PRN

## 2012-12-22 NOTE — Telephone Encounter (Signed)
Hydrocodone/acet 7.5/300mg  rx called to [facility].

## 2012-12-22 NOTE — Telephone Encounter (Signed)
Fax from [facility] - requesting refill on Hydrocodone/ACET 7.5/300mg  tablets - Take 1 tab by mouth q6hrs PRN  Qty# 45. Thanks

## 2012-12-22 NOTE — Telephone Encounter (Signed)
Needs to be called in. I placed the refill in his medication list.

## 2012-12-23 ENCOUNTER — Encounter (HOSPITAL_COMMUNITY): Payer: Self-pay

## 2012-12-23 ENCOUNTER — Ambulatory Visit (HOSPITAL_COMMUNITY)
Admission: RE | Admit: 2012-12-23 | Discharge: 2012-12-23 | Disposition: A | Discharge status: Home / Self Care | Payer: Medicare HMO | Source: Ambulatory Visit | Attending: Internal Medicine | Admitting: Internal Medicine

## 2012-12-23 DIAGNOSIS — R072 Precordial pain: Secondary | ICD-10-CM | POA: Insufficient documentation

## 2012-12-23 DIAGNOSIS — R634 Abnormal weight loss: Secondary | ICD-10-CM | POA: Insufficient documentation

## 2012-12-23 DIAGNOSIS — R059 Cough, unspecified: Secondary | ICD-10-CM | POA: Insufficient documentation

## 2012-12-23 DIAGNOSIS — R05 Cough: Secondary | ICD-10-CM | POA: Insufficient documentation

## 2012-12-23 DIAGNOSIS — J438 Other emphysema: Secondary | ICD-10-CM | POA: Insufficient documentation

## 2012-12-23 DIAGNOSIS — I871 Compression of vein: Secondary | ICD-10-CM

## 2012-12-23 DIAGNOSIS — R0602 Shortness of breath: Secondary | ICD-10-CM | POA: Insufficient documentation

## 2012-12-23 MED ORDER — IOHEXOL 300 MG/ML  SOLN
100.0000 mL | Freq: Once | INTRAMUSCULAR | Status: AC | PRN
  Administered 2012-12-23: 100 mL via INTRAVENOUS

## 2013-02-03 ENCOUNTER — Ambulatory Visit (HOSPITAL_COMMUNITY)
Admission: RE | Admit: 2013-02-03 | Discharge: 2013-02-03 | Disposition: A | Discharge status: Home / Self Care | Payer: Medicare HMO | Source: Ambulatory Visit | Attending: Internal Medicine | Admitting: Internal Medicine

## 2013-02-03 ENCOUNTER — Ambulatory Visit (INDEPENDENT_AMBULATORY_CARE_PROVIDER_SITE_OTHER): Payer: Medicare HMO | Admitting: Internal Medicine

## 2013-02-03 ENCOUNTER — Other Ambulatory Visit: Payer: Self-pay | Admitting: Licensed Clinical Social Worker

## 2013-02-03 ENCOUNTER — Other Ambulatory Visit: Payer: Self-pay | Admitting: Internal Medicine

## 2013-02-03 ENCOUNTER — Encounter: Payer: Self-pay | Admitting: Internal Medicine

## 2013-02-03 VITALS — BP 137/79 | HR 60 | Temp 97.1°F | Ht 69.0 in | Wt 197.7 lb

## 2013-02-03 DIAGNOSIS — N529 Male erectile dysfunction, unspecified: Secondary | ICD-10-CM

## 2013-02-03 DIAGNOSIS — M25519 Pain in unspecified shoulder: Secondary | ICD-10-CM | POA: Insufficient documentation

## 2013-02-03 DIAGNOSIS — L539 Erythematous condition, unspecified: Secondary | ICD-10-CM

## 2013-02-03 DIAGNOSIS — R52 Pain, unspecified: Secondary | ICD-10-CM

## 2013-02-03 DIAGNOSIS — M19019 Primary osteoarthritis, unspecified shoulder: Secondary | ICD-10-CM

## 2013-02-03 DIAGNOSIS — E119 Type 2 diabetes mellitus without complications: Secondary | ICD-10-CM

## 2013-02-03 DIAGNOSIS — B20 Human immunodeficiency virus [HIV] disease: Secondary | ICD-10-CM

## 2013-02-03 DIAGNOSIS — I10 Essential (primary) hypertension: Secondary | ICD-10-CM

## 2013-02-03 DIAGNOSIS — IMO0002 Reserved for concepts with insufficient information to code with codable children: Secondary | ICD-10-CM

## 2013-02-03 DIAGNOSIS — T148XXS Other injury of unspecified body region, sequela: Secondary | ICD-10-CM

## 2013-02-03 DIAGNOSIS — S30812S Abrasion of penis, sequela: Secondary | ICD-10-CM

## 2013-02-03 LAB — GLUCOSE, CAPILLARY: Glucose-Capillary: 131 mg/dL — ABNORMAL HIGH (ref 70–99)

## 2013-02-03 MED ORDER — EFAVIRENZ-EMTRICITAB-TENOFOVIR 600-200-300 MG PO TABS: 1.0000 | ORAL_TABLET | Freq: Every day | ORAL | Status: DC

## 2013-02-03 MED ORDER — TADALAFIL 5 MG PO TABS: 5.0000 mg | ORAL_TABLET | Freq: Every day | ORAL | Status: DC | PRN

## 2013-02-03 NOTE — Assessment & Plan Note (Signed)
Repeat A1c not due yet. Although well controlled DM 2 with last A1c 7.1.

## 2013-02-03 NOTE — Progress Notes (Signed)
I discussed this patient's care plan with Dr. Allena Katz soon after he saw the patient. I agree with his management of this case and I agree with the Urology referral for the patient's ongoing penile bleeding problem. When the patient comes back next week, a thorough genital exam would be documented by Dr. Allena Katz.

## 2013-02-03 NOTE — Assessment & Plan Note (Signed)
Patient with history of shoulder arthropathy with acute worsening of bilateral shoulder pain about 2 weeks. Unclear etiology of flareup. No diagnosis of rheumatoid arthritis or other systemic joint pathology.  - No new blood testing needed. - Will get bilateral shoulder x-rays. - continue hydrocodone which he is taking and at ibuprofen as needed until after the clinic visit in 5-7 days.

## 2013-02-03 NOTE — Assessment & Plan Note (Addendum)
Cialis 10 mg as needed for erectile dysfunction. He had one month for a trial coupon with him.  Advised him to avoid taking it within 4 hours of taking Terazosin.

## 2013-02-03 NOTE — Assessment & Plan Note (Signed)
BP Readings from Last 3 Encounters:  02/03/13 137/79  12/15/12 132/79  12/04/12 125/61    Lab Results  Component Value Date   NA 135 12/03/2012   K 3.7 12/03/2012   CREATININE 0.79 12/03/2012    Assessment: Blood pressure control:   at goal  Progress toward BP goal:    stable Comments:   Plan: Medications:  continue current medications, Continue amlodipine, HCTZ,  Educational resources provided:   Self management tools provided:   Other plans: No new changes.

## 2013-02-03 NOTE — Progress Notes (Signed)
  Subjective:    Patient ID: Riley Jones, male    DOB: 03-04-1956, 57 y.o.   MRN: [id number]  HPI patient is a pleasant 57 year old man with DM 2, hypertension, well-controlled HIV, erectile dysfunction and other problems as per problem list comes to the clinic with acute bilateral shoulder pain for last 2 weeks.  He has history of shoulder arthropathy for many years, but was practically pain-free for about 15 years. She suddenly developed bilateral shoulder pain right more than left about 2 weeks before. Pain is constant with radiation in the arm. Gets worse with shoulder movements. No tingling or numbness or any weakness in bilateral upper extremities. No fever, chills, rash, other joint pains, joint swellings. Denies any neck pain, headache, vision changes, palpitations, chest pain, short of breath.  Denies any new diarrhea or urinary abnormalities. had recent sinus infection for which he got amoxicillin.  Also wants prescription for Cialis as he has free sample trial for one month.  She also has rebleeding from his penis scar- which is stopped with bandage but keeps bleeding intermittently. She has a new sexual partner and does not want to break off due to this issue.  Review of Systems    As per history of present illness Objective:   Physical Exam  General: NAD HEENT: PERRL, EOMI, no scleral icterus Cardiac: S1, S2, RRR, no rubs, murmurs or gallops Pulm: clear to auscultation bilaterally, moving normal volumes of air Abd: soft, nontender, nondistended, BS present Ext: warm and well perfused, no pedal edema. Bilateral shoulder tenderness to palpation anteriorly and posteriorly- right more than left. No redness or swelling noted. Possible overhead range of motion of both shoulder joint but with pain. No weakness or sensory changes in bilateral upper extremities. Neuro: alert and oriented X3, cranial nerves II-XII grossly intact       Assessment & Plan:

## 2013-02-03 NOTE — Assessment & Plan Note (Signed)
No SVC syndrome per recent CT scan. - Probably just farmer's tan.

## 2013-02-03 NOTE — Patient Instructions (Signed)
Please make a followup appointment 5-7 days.  Get x-rays of shoulder done today.  Followup with urology.  Start taking ibuprofen 200 mg- 2-4 tablets every 8 hours as needed along with hydrocodone for pain.  Call earlier if symptoms get worse.

## 2013-02-03 NOTE — Assessment & Plan Note (Signed)
Unclear etiology of old scar. Bleeding tendencies ruled out in January 2013. Intermittent bleeding- stopped with bandaging.  - Urology referral for advice and help management if there is any procedures needed to be done.

## 2013-02-22 ENCOUNTER — Other Ambulatory Visit: Payer: Self-pay | Admitting: Infectious Diseases

## 2013-02-23 ENCOUNTER — Telehealth: Payer: Self-pay | Admitting: *Deleted

## 2013-02-23 DIAGNOSIS — F419 Anxiety disorder, unspecified: Secondary | ICD-10-CM

## 2013-02-23 MED ORDER — ALPRAZOLAM 0.5 MG PO TABS: 0.5000 mg | ORAL_TABLET | Freq: Every evening | ORAL | Status: DC | PRN

## 2013-02-23 NOTE — Telephone Encounter (Signed)
Refill request from pharmacy for Xanax #40, directions are 1 tablet at night for sleep. Previously filled #30 until April when increased. Is this an error or should it be 40? Wendall Mola

## 2013-02-23 NOTE — Telephone Encounter (Signed)
Per Dr. Ninetta Lights refill Xanax # 30. Wendall Mola

## 2013-02-24 ENCOUNTER — Other Ambulatory Visit: Payer: Self-pay | Admitting: *Deleted

## 2013-02-24 DIAGNOSIS — E119 Type 2 diabetes mellitus without complications: Secondary | ICD-10-CM

## 2013-02-24 MED ORDER — METFORMIN HCL 1000 MG PO TABS: 1000.0000 mg | ORAL_TABLET | Freq: Two times a day (BID) | ORAL | Status: DC

## 2013-03-09 ENCOUNTER — Encounter: Payer: Self-pay | Admitting: Internal Medicine

## 2013-03-09 ENCOUNTER — Ambulatory Visit (INDEPENDENT_AMBULATORY_CARE_PROVIDER_SITE_OTHER): Payer: Medicare HMO | Admitting: Internal Medicine

## 2013-03-09 VITALS — BP 136/85 | HR 63 | Temp 96.3°F | Ht 70.0 in | Wt 200.1 lb

## 2013-03-09 DIAGNOSIS — D509 Iron deficiency anemia, unspecified: Secondary | ICD-10-CM

## 2013-03-09 DIAGNOSIS — E119 Type 2 diabetes mellitus without complications: Secondary | ICD-10-CM

## 2013-03-09 DIAGNOSIS — M069 Rheumatoid arthritis, unspecified: Secondary | ICD-10-CM

## 2013-03-09 DIAGNOSIS — E611 Iron deficiency: Secondary | ICD-10-CM

## 2013-03-09 DIAGNOSIS — I10 Essential (primary) hypertension: Secondary | ICD-10-CM

## 2013-03-09 DIAGNOSIS — E785 Hyperlipidemia, unspecified: Secondary | ICD-10-CM

## 2013-03-09 LAB — FERRITIN: Ferritin: 51 ng/mL (ref 22–322)

## 2013-03-09 LAB — IBC PANEL
TIBC: 410 ug/dL (ref 215–435)
UIBC: 333 ug/dL (ref 125–400)

## 2013-03-09 MED ORDER — PRAVASTATIN SODIUM 40 MG PO TABS: 40.0000 mg | ORAL_TABLET | Freq: Every day | ORAL | Status: DC

## 2013-03-09 MED ORDER — INSULIN GLARGINE 100 UNIT/ML ~~LOC~~ SOLN: 10.0000 [IU] | Freq: Every day | SUBCUTANEOUS | Status: DC

## 2013-03-09 MED ORDER — TERAZOSIN HCL 2 MG PO CAPS: 4.0000 mg | ORAL_CAPSULE | Freq: Every day | ORAL | Status: DC

## 2013-03-09 MED ORDER — METFORMIN HCL 1000 MG PO TABS: 1000.0000 mg | ORAL_TABLET | Freq: Two times a day (BID) | ORAL | Status: DC

## 2013-03-09 NOTE — Progress Notes (Signed)
  Subjective:    Patient ID: Riley Jones, male    DOB: 1955/11/10, 57 y.o.   MRN: [id number]  HPI Feels well. States he's taking his medication.  Denies hematuria, melena, hematochezia.  Denies any further complaints.  Review of Systems Complete 12 point ROS otherwise negative except for that stated in the HPI.    Objective:   Physical Exam Filed Vitals:   03/09/13 0900  BP: 136/85  Pulse: 63  Temp: 96.3 F (35.7 C)   GEN: AAOx3, NAD HEENT: EOMI, PERRL, no icterus, no adenopathy. CV: S1S2, no m/r/g, RRR PULM: CTA bilat ABD/GI: Soft, NT, +BS, no guarding, no distention. LE/UE: 2/4 pulses, no c/c/e. NEURO: CN II-XII intact, no focal deficits. GU: No hernias, no lesions noted.     Assessment & Plan:  56 yr. Old WM w/ pmhx significant for depression, Type 2 IDDM, HTN, HIV followed by ID on HAART, HL, RA, tobacco abuse, presents for follow up.  1) HTN: Well controlled.  2) Type 2 DM: No reported hypoglycemia.HbA1c is 7.4%. Add Lantus 10 units QHS. Educated on hypoglycemia. 3) HIV: On HAART. On followed by ID.  4) RA: Refer to Rheumatology again, discussed with patient once again. Needs consideration for DMARD therapy. On chronic vicodin for now.   5) HL: LDL 107. On statin therapy.  Lipid panel scheduled for repeat next month. 6) Recent hx melena: Need colonoscopy report, I received EGD but not colonoscopy report. No further occurrence. 7) Depression: Off medication, doing well. Denies any suicidal ideation.  8) Iron deficiency: Likely from recent UGI source. Repeat Iron panel, no need to recheck CBC, they have been normal in the past few times. 9) Obesity: Body mass index is 31.16 kg/(m^2). Educated on diet.  10) Health Maintenance: EGD done 2013 with antral erosions, needs colonoscopy report. He has had pneumovax. Optho still pending, he did not go, I advised him once again. He is slowly quitting tobacco use.   Return in 3 months.

## 2013-03-09 NOTE — Patient Instructions (Signed)
Blood work today. I referred you to rheumatology, please remember to make appointment. Remember to see your ophthalmologist. Blood work today. Start Lantus 10 units at night (insulin), stop your old insulin. Return in 3 months.

## 2013-03-13 ENCOUNTER — Encounter: Payer: Self-pay | Admitting: Dietician

## 2013-03-24 ENCOUNTER — Other Ambulatory Visit: Payer: Self-pay | Admitting: *Deleted

## 2013-03-24 DIAGNOSIS — E611 Iron deficiency: Secondary | ICD-10-CM

## 2013-03-24 DIAGNOSIS — I10 Essential (primary) hypertension: Secondary | ICD-10-CM

## 2013-03-24 DIAGNOSIS — E119 Type 2 diabetes mellitus without complications: Secondary | ICD-10-CM

## 2013-03-24 DIAGNOSIS — M069 Rheumatoid arthritis, unspecified: Secondary | ICD-10-CM

## 2013-03-24 DIAGNOSIS — E785 Hyperlipidemia, unspecified: Secondary | ICD-10-CM

## 2013-03-24 NOTE — Telephone Encounter (Signed)
Last filled 5/28

## 2013-03-29 MED ORDER — HYDROCODONE-ACETAMINOPHEN 7.5-750 MG PO TABS: 1.0000 | ORAL_TABLET | Freq: Four times a day (QID) | ORAL | Status: DC | PRN

## 2013-03-29 NOTE — Telephone Encounter (Signed)
Rx called in 

## 2013-04-06 ENCOUNTER — Other Ambulatory Visit: Payer: Self-pay

## 2013-04-20 NOTE — Addendum Note (Signed)
Addended by: Bufford Spikes on: 04/20/2013 02:11 PM   Modules accepted: Orders

## 2013-04-20 NOTE — Addendum Note (Signed)
Addended by: Bufford Spikes on: 04/20/2013 02:09 PM   Modules accepted: Orders

## 2013-05-03 ENCOUNTER — Other Ambulatory Visit: Payer: Self-pay

## 2013-05-03 ENCOUNTER — Other Ambulatory Visit: Payer: Self-pay | Admitting: *Deleted

## 2013-05-03 DIAGNOSIS — I10 Essential (primary) hypertension: Secondary | ICD-10-CM

## 2013-05-03 DIAGNOSIS — E785 Hyperlipidemia, unspecified: Secondary | ICD-10-CM

## 2013-05-03 DIAGNOSIS — E119 Type 2 diabetes mellitus without complications: Secondary | ICD-10-CM

## 2013-05-03 DIAGNOSIS — M069 Rheumatoid arthritis, unspecified: Secondary | ICD-10-CM

## 2013-05-03 DIAGNOSIS — E611 Iron deficiency: Secondary | ICD-10-CM

## 2013-05-04 MED ORDER — HYDROCHLOROTHIAZIDE 12.5 MG PO CAPS: 12.5000 mg | ORAL_CAPSULE | Freq: Every day | ORAL | Status: DC

## 2013-05-08 ENCOUNTER — Other Ambulatory Visit: Payer: Medicare HMO

## 2013-05-08 ENCOUNTER — Ambulatory Visit: Payer: Medicare HMO

## 2013-05-08 DIAGNOSIS — Z113 Encounter for screening for infections with a predominantly sexual mode of transmission: Secondary | ICD-10-CM

## 2013-05-08 DIAGNOSIS — E119 Type 2 diabetes mellitus without complications: Secondary | ICD-10-CM

## 2013-05-08 DIAGNOSIS — Z79899 Other long term (current) drug therapy: Secondary | ICD-10-CM

## 2013-05-08 DIAGNOSIS — B20 Human immunodeficiency virus [HIV] disease: Secondary | ICD-10-CM

## 2013-05-08 LAB — COMPREHENSIVE METABOLIC PANEL
ALT: 33 U/L (ref 0–53)
Albumin: 4.3 g/dL (ref 3.5–5.2)
CO2: 28 mEq/L (ref 19–32)
Glucose, Bld: 150 mg/dL — ABNORMAL HIGH (ref 70–99)
Potassium: 4.6 mEq/L (ref 3.5–5.3)
Sodium: 138 mEq/L (ref 135–145)
Total Protein: 6.9 g/dL (ref 6.0–8.3)

## 2013-05-08 LAB — CBC
Hemoglobin: 15.9 g/dL (ref 13.0–17.0)
Platelets: 183 10*3/uL (ref 150–400)
RBC: 4.82 MIL/uL (ref 4.22–5.81)
WBC: 8.7 10*3/uL (ref 4.0–10.5)

## 2013-05-08 LAB — LIPID PANEL
Cholesterol: 173 mg/dL (ref 0–200)
LDL Cholesterol: 101 mg/dL — ABNORMAL HIGH (ref 0–99)
Triglycerides: 149 mg/dL (ref <150)

## 2013-05-09 LAB — HIV-1 RNA QUANT-NO REFLEX-BLD
HIV 1 RNA Quant: <20 copies/mL (ref <20)
HIV-1 RNA Quant, Log: <1.3 {Log} (ref <1.30)

## 2013-05-09 LAB — T-HELPER CELL (CD4) - (RCID CLINIC ONLY): CD4 % Helper T Cell: 41 % (ref 33–55)

## 2013-05-22 ENCOUNTER — Ambulatory Visit (INDEPENDENT_AMBULATORY_CARE_PROVIDER_SITE_OTHER): Payer: Medicare HMO | Admitting: Infectious Diseases

## 2013-05-22 ENCOUNTER — Encounter: Payer: Self-pay | Admitting: Infectious Diseases

## 2013-05-22 VITALS — BP 156/66 | HR 66 | Temp 97.8°F | Ht 69.0 in | Wt 202.0 lb

## 2013-05-22 DIAGNOSIS — E119 Type 2 diabetes mellitus without complications: Secondary | ICD-10-CM

## 2013-05-22 DIAGNOSIS — F419 Anxiety disorder, unspecified: Secondary | ICD-10-CM

## 2013-05-22 DIAGNOSIS — Z113 Encounter for screening for infections with a predominantly sexual mode of transmission: Secondary | ICD-10-CM

## 2013-05-22 DIAGNOSIS — I209 Angina pectoris, unspecified: Secondary | ICD-10-CM

## 2013-05-22 DIAGNOSIS — Z23 Encounter for immunization: Secondary | ICD-10-CM

## 2013-05-22 DIAGNOSIS — F17200 Nicotine dependence, unspecified, uncomplicated: Secondary | ICD-10-CM

## 2013-05-22 DIAGNOSIS — F411 Generalized anxiety disorder: Secondary | ICD-10-CM

## 2013-05-22 DIAGNOSIS — B20 Human immunodeficiency virus [HIV] disease: Secondary | ICD-10-CM

## 2013-05-22 MED ORDER — ALPRAZOLAM 0.5 MG PO TABS: 0.5000 mg | ORAL_TABLET | Freq: Every evening | ORAL | Status: DC | PRN

## 2013-05-22 NOTE — Assessment & Plan Note (Signed)
Appears to be doing well. Greatly appreicate Dr Dion Body partnering with [place].

## 2013-05-22 NOTE — Progress Notes (Signed)
  Subjective:    Patient ID: Riley Jones, male    DOB: [birth year], 57 y.o.   MRN: [id number]  HPI 57 yo M with hx of HIV+ (~41yrs), DM2 (>71yrs) and RA. He has been maintained on atripla (has been on multiple previous meds). Has been having difficulty with sinus infections. Is on prednisone for arthritis. Has caused him to gain some wt back. Still working, busy. Still at 1ppd, has rx for chantix has not started yet (initial dose gave him weird dreams).  Some problems with sleep. Does not attribute to [place].   HIV 1 RNA Quant (copies/mL)  Date Value  05/08/2013 <20   09/12/2012 <20   03/14/2012 <20      CD4 T Cell Abs (cmm)  Date Value  05/08/2013 730   09/12/2012 920   03/14/2012 880    Had colonoscopy-  Polyp removed. Repeat in 5 years. Going to ophtho. Has been to rheum.   Review of Systems  Constitutional: Positive for unexpected weight change. Negative for appetite change.  HENT: Positive for postnasal drip.   Eyes: Negative for visual disturbance.  Respiratory: Negative for shortness of breath.   Cardiovascular: Negative for chest pain.  Gastrointestinal: Negative for diarrhea and constipation.  Genitourinary: Negative for difficulty urinating.       Objective:   Physical Exam  Constitutional: He appears well-developed and well-nourished.  HENT:  Mouth/Throat: No oropharyngeal exudate.  Eyes: EOM are normal. Pupils are equal, round, and reactive to light.  Neck: Neck supple.  Cardiovascular: Normal rate, regular rhythm and normal heart sounds.   Pulmonary/Chest: Effort normal and breath sounds normal.  Abdominal: Soft. Bowel sounds are normal. There is no tenderness.  Musculoskeletal: He exhibits no edema.  Lymphadenopathy:    He has no cervical adenopathy.  Skin: Skin is warm and dry.          Assessment & Plan:

## 2013-05-22 NOTE — Assessment & Plan Note (Addendum)
Thinking about dating. Given condoms. Gets flu shot. Hep b up to date. Doing very well. Will see him back in 6 months.

## 2013-05-22 NOTE — Assessment & Plan Note (Signed)
Xanax is refilled, takes mostly for sleep.

## 2013-05-22 NOTE — Assessment & Plan Note (Signed)
Encouraged him to quit 

## 2013-05-22 NOTE — Assessment & Plan Note (Signed)
Pt attributes to GERD.

## 2013-05-24 ENCOUNTER — Telehealth: Payer: Self-pay | Admitting: *Deleted

## 2013-05-24 NOTE — Telephone Encounter (Signed)
Pt called to get a referral for the [facility] phone # [phone] for appt 04/24/13 amd 11/2012. Has [employer] Medicare and for insurance to pay for visits - needs referral. Pt works PT at [facility]. Both times problems with sinus. Stanton Kidney Jermal Dismuke RN 05/24/13 3PM

## 2013-05-26 NOTE — Telephone Encounter (Signed)
Riley Jones, can we refer this patient as he requested?

## 2013-05-26 NOTE — Telephone Encounter (Signed)
Printed copy of this note given to [place].

## 2013-06-12 ENCOUNTER — Other Ambulatory Visit: Payer: Self-pay

## 2013-06-12 DIAGNOSIS — F419 Anxiety disorder, unspecified: Secondary | ICD-10-CM

## 2013-06-12 MED ORDER — ALPRAZOLAM 0.5 MG PO TABS: 0.5000 mg | ORAL_TABLET | Freq: Every evening | ORAL | Status: DC | PRN

## 2013-06-22 ENCOUNTER — Other Ambulatory Visit: Payer: Self-pay | Admitting: Internal Medicine

## 2013-06-22 NOTE — Telephone Encounter (Signed)
Hydrocodone rx faxed to [facility].

## 2013-06-23 ENCOUNTER — Other Ambulatory Visit: Payer: Self-pay | Admitting: Internal Medicine

## 2013-06-26 ENCOUNTER — Other Ambulatory Visit: Payer: Self-pay | Admitting: Internal Medicine

## 2013-06-27 NOTE — Telephone Encounter (Signed)
Please phone in

## 2013-06-28 ENCOUNTER — Other Ambulatory Visit: Payer: Self-pay | Admitting: Internal Medicine

## 2013-06-29 ENCOUNTER — Other Ambulatory Visit: Payer: Self-pay | Admitting: *Deleted

## 2013-06-29 DIAGNOSIS — K219 Gastro-esophageal reflux disease without esophagitis: Secondary | ICD-10-CM

## 2013-06-29 MED ORDER — OMEPRAZOLE 20 MG PO CPDR: 20.0000 mg | DELAYED_RELEASE_CAPSULE | Freq: Every day | ORAL | Status: DC

## 2013-07-19 ENCOUNTER — Other Ambulatory Visit: Payer: Self-pay | Admitting: Internal Medicine

## 2013-07-26 ENCOUNTER — Other Ambulatory Visit: Payer: Self-pay | Admitting: *Deleted

## 2013-07-27 ENCOUNTER — Other Ambulatory Visit: Payer: Self-pay | Admitting: Internal Medicine

## 2013-07-27 MED ORDER — HYDROCODONE-ACETAMINOPHEN 7.5-300 MG PO TABS: ORAL_TABLET | ORAL | Status: DC

## 2013-07-31 ENCOUNTER — Other Ambulatory Visit: Payer: Self-pay | Admitting: *Deleted

## 2013-07-31 DIAGNOSIS — B20 Human immunodeficiency virus [HIV] disease: Secondary | ICD-10-CM

## 2013-07-31 MED ORDER — EFAVIRENZ-EMTRICITAB-TENOFOVIR 600-200-300 MG PO TABS: 1.0000 | ORAL_TABLET | Freq: Every day | ORAL | Status: DC

## 2013-08-01 ENCOUNTER — Other Ambulatory Visit: Payer: Self-pay | Admitting: Licensed Clinical Social Worker

## 2013-08-01 DIAGNOSIS — B20 Human immunodeficiency virus [HIV] disease: Secondary | ICD-10-CM

## 2013-08-01 MED ORDER — EFAVIRENZ-EMTRICITAB-TENOFOVIR 600-200-300 MG PO TABS: 1.0000 | ORAL_TABLET | Freq: Every day | ORAL | Status: DC

## 2013-08-03 ENCOUNTER — Other Ambulatory Visit: Payer: Self-pay

## 2013-08-13 ENCOUNTER — Other Ambulatory Visit: Payer: Self-pay | Admitting: Internal Medicine

## 2013-08-25 ENCOUNTER — Other Ambulatory Visit: Payer: Self-pay | Admitting: Internal Medicine

## 2013-08-30 LAB — HM DIABETES EYE EXAM

## 2013-08-31 ENCOUNTER — Other Ambulatory Visit: Payer: Self-pay | Admitting: *Deleted

## 2013-08-31 ENCOUNTER — Ambulatory Visit: Payer: Medicare HMO | Admitting: Internal Medicine

## 2013-08-31 MED ORDER — HYDROCODONE-ACETAMINOPHEN 7.5-300 MG PO TABS: ORAL_TABLET | ORAL | Status: DC

## 2013-08-31 NOTE — Telephone Encounter (Signed)
Rx given to pt. 

## 2013-08-31 NOTE — Telephone Encounter (Signed)
Pt is here at the clinic; states his appt w/Dr Kem Kays was canceled for today.

## 2013-09-08 ENCOUNTER — Ambulatory Visit (INDEPENDENT_AMBULATORY_CARE_PROVIDER_SITE_OTHER): Payer: Medicare HMO | Admitting: Internal Medicine

## 2013-09-08 ENCOUNTER — Encounter: Payer: Self-pay | Admitting: Internal Medicine

## 2013-09-08 VITALS — BP 142/79 | HR 64 | Temp 96.7°F | Ht 68.0 in | Wt 203.4 lb

## 2013-09-08 DIAGNOSIS — E119 Type 2 diabetes mellitus without complications: Secondary | ICD-10-CM

## 2013-09-08 DIAGNOSIS — I10 Essential (primary) hypertension: Secondary | ICD-10-CM

## 2013-09-08 DIAGNOSIS — E785 Hyperlipidemia, unspecified: Secondary | ICD-10-CM

## 2013-09-08 DIAGNOSIS — M069 Rheumatoid arthritis, unspecified: Secondary | ICD-10-CM

## 2013-09-08 LAB — GLUCOSE, CAPILLARY: Glucose-Capillary: 198 mg/dL — ABNORMAL HIGH (ref 70–99)

## 2013-09-08 MED ORDER — HYDROCHLOROTHIAZIDE 25 MG PO TABS: 25.0000 mg | ORAL_TABLET | Freq: Every day | ORAL | Status: DC

## 2013-09-08 MED ORDER — FOLIC ACID 1 MG PO TABS: 1.0000 mg | ORAL_TABLET | Freq: Every day | ORAL | Status: AC

## 2013-09-08 NOTE — Progress Notes (Signed)
Subjective:    Patient ID: Riley Jones, male    DOB: 10-14-1955, 57 y.o.   MRN: [id number]  HPI He has been seeing Rheumatology. He is currently on Methotrexate therapy.  Denies hypoglycemia. Taking all medications as prescribed.   Review of Systems  Constitutional: Negative for fever, chills and unexpected weight change.  HENT: Negative for trouble swallowing.   Respiratory: Negative for shortness of breath.   Cardiovascular: Negative for chest pain.  Gastrointestinal: Negative for nausea, vomiting and blood in stool.  Genitourinary: Negative for dysuria and difficulty urinating.  Musculoskeletal: Positive for arthralgias. Negative for joint swelling.  Neurological: Negative for weakness.       Objective:   Physical Exam  Constitutional: He is oriented to person, place, and time. He appears well-developed and well-nourished. No distress.  HENT:  Head: Normocephalic and atraumatic.  Eyes: Conjunctivae are normal. Pupils are equal, round, and reactive to light. Right eye exhibits no discharge. Left eye exhibits no discharge.  Neck: Normal range of motion. Neck supple. No JVD present. No tracheal deviation present. No thyromegaly present.  Cardiovascular: Normal rate, regular rhythm and normal heart sounds.  Exam reveals no gallop and no friction rub.   No murmur heard. Pulmonary/Chest: Effort normal and breath sounds normal. No respiratory distress. He has no wheezes.  Abdominal: Soft. Bowel sounds are normal. He exhibits no distension and no mass. There is no tenderness. There is no guarding.  Musculoskeletal: Normal range of motion. He exhibits no edema and no tenderness.  Lymphadenopathy:    He has no cervical adenopathy.  Neurological: He is alert and oriented to person, place, and time. He has normal strength and normal reflexes. He displays normal reflexes. No cranial nerve deficit or sensory deficit. Coordination normal.  Reflex Scores:      Tricep reflexes are 2+ on  the right side and 2+ on the left side.      Bicep reflexes are 2+ on the right side and 2+ on the left side.      Brachioradialis reflexes are 2+ on the right side and 2+ on the left side.      Patellar reflexes are 2+ on the right side and 2+ on the left side.      Achilles reflexes are 2+ on the right side and 2+ on the left side. Skin: Skin is warm. He is not diaphoretic.  Psychiatric: He has a normal mood and affect. His behavior is normal.   Filed Vitals:   09/08/13 1003  BP: 142/79  Pulse: 64  Temp: 96.7 F (35.9 C)       Assessment & Plan:  56 yr. Old WM w/ pmhx significant for depression, Type 2 IDDM, HTN, HIV followed by ID on HAART, HL, RA, tobacco abuse, presents for follow up.  1) HTN: Increase HCTZ to 25 mg daily.  2) Type 2 IDDM: No reported hypoglycemia.HbA1c is 6.9%. Lantus 15 units QHS. Educated on hypoglycemia. Check urine prot.   3) HIV: On HAART. On followed by ID.  4) RA: Following with rheumatology. On DMARD therapy.  5) HL: LDL 101. On statin therapy.  6) Depression: Off medication, doing well. Denies any suicidal ideation.  7) Obesity: Body mass index is 30.93 kg/(m^2). Educated on diet.  8 Health Maintenance: EGD done 2013 with antral erosions, need colonoscopy report, this has not been sent to me. Retinal scan performed, no diabetic retinopathy. He is slowly quitting tobacco use.   Return in 3 months.  Jonah Blue, DO,  [facility] 09/08/2013, 11:02 AM

## 2013-09-08 NOTE — Patient Instructions (Signed)
-  Urine test today. -Increase HCTZ to 25 mg a day (blood pressure). -Continue to attempt quitting smoking. -Diabetes is well controlled (HgbA1C is 6.9%). -Return in 3 months.

## 2013-09-09 LAB — PROTEIN / CREATININE RATIO, URINE
Protein Creatinine Ratio: 0.1 (ref <0.15)
Total Protein, Urine: 8 mg/dL

## 2013-09-15 ENCOUNTER — Other Ambulatory Visit: Payer: Self-pay

## 2013-09-15 DIAGNOSIS — F419 Anxiety disorder, unspecified: Secondary | ICD-10-CM

## 2013-09-15 MED ORDER — ALPRAZOLAM 0.5 MG PO TABS: 0.5000 mg | ORAL_TABLET | Freq: Every evening | ORAL | Status: DC | PRN

## 2013-09-29 ENCOUNTER — Other Ambulatory Visit: Payer: Self-pay | Admitting: *Deleted

## 2013-10-02 MED ORDER — HYDROCODONE-ACETAMINOPHEN 7.5-300 MG PO TABS: ORAL_TABLET | ORAL | Status: DC

## 2013-10-03 ENCOUNTER — Other Ambulatory Visit: Payer: Self-pay | Admitting: Internal Medicine

## 2013-10-09 ENCOUNTER — Telehealth: Payer: Self-pay | Admitting: Internal Medicine

## 2013-10-09 NOTE — Telephone Encounter (Signed)
[facility] and confirmed patient was sch for 02/10/2013 @ 10:35am.   Patient cancelled his appointment and never resch his appointment with their office.

## 2013-10-18 ENCOUNTER — Other Ambulatory Visit: Payer: Self-pay | Admitting: Internal Medicine

## 2013-10-24 ENCOUNTER — Telehealth: Payer: Self-pay | Admitting: *Deleted

## 2013-10-24 MED ORDER — HYDROCODONE-ACETAMINOPHEN 7.5-325 MG PO TABS: 1.0000 | ORAL_TABLET | Freq: Four times a day (QID) | ORAL | Status: DC | PRN

## 2013-10-24 NOTE — Telephone Encounter (Signed)
I have changed this  

## 2013-10-24 NOTE — Telephone Encounter (Addendum)
Received PA request from pt's pharmacy for his vicodin 7.5-300 one every 6hrs prn pain #60.  Insurance does not cover this dose, but will pay for  Hydrocodone/acetaminophen 7.5-325mg  dose #60 (covered as a tier 3 medication).  Will forward info to pcp for review, please advise.Despina Hidden Cassady1/27/20152:25 PM        Member ID [id number]  (LENGTH OF CALL 35 min.)

## 2013-11-13 ENCOUNTER — Other Ambulatory Visit: Payer: Self-pay | Admitting: *Deleted

## 2013-11-13 MED ORDER — HYDROCODONE-ACETAMINOPHEN 7.5-325 MG PO TABS: 1.0000 | ORAL_TABLET | Freq: Four times a day (QID) | ORAL | Status: DC | PRN

## 2013-11-13 NOTE — Telephone Encounter (Signed)
Called pt, l/m .

## 2013-11-16 NOTE — Telephone Encounter (Signed)
Returned pt's call - informed pt rx ready to be picked up.

## 2013-11-20 ENCOUNTER — Other Ambulatory Visit: Payer: Self-pay | Admitting: *Deleted

## 2013-11-20 ENCOUNTER — Other Ambulatory Visit: Payer: Self-pay | Admitting: Internal Medicine

## 2013-11-20 ENCOUNTER — Other Ambulatory Visit (INDEPENDENT_AMBULATORY_CARE_PROVIDER_SITE_OTHER): Payer: Medicare HMO

## 2013-11-20 ENCOUNTER — Encounter: Payer: Self-pay | Admitting: *Deleted

## 2013-11-20 ENCOUNTER — Other Ambulatory Visit: Payer: Self-pay | Admitting: Licensed Clinical Social Worker

## 2013-11-20 ENCOUNTER — Telehealth: Payer: Self-pay | Admitting: *Deleted

## 2013-11-20 DIAGNOSIS — E785 Hyperlipidemia, unspecified: Secondary | ICD-10-CM

## 2013-11-20 DIAGNOSIS — Z113 Encounter for screening for infections with a predominantly sexual mode of transmission: Secondary | ICD-10-CM

## 2013-11-20 DIAGNOSIS — B20 Human immunodeficiency virus [HIV] disease: Secondary | ICD-10-CM

## 2013-11-20 DIAGNOSIS — E119 Type 2 diabetes mellitus without complications: Secondary | ICD-10-CM

## 2013-11-20 LAB — COMPLETE METABOLIC PANEL WITH GFR
ALT: 51 U/L (ref 0–53)
AST: 31 U/L (ref 0–37)
Albumin: 4.5 g/dL (ref 3.5–5.2)
Alkaline Phosphatase: 83 U/L (ref 39–117)
BILIRUBIN TOTAL: 0.5 mg/dL (ref 0.2–1.2)
BUN: 14 mg/dL (ref 6–23)
CO2: 28 meq/L (ref 19–32)
CREATININE: 0.86 mg/dL (ref 0.50–1.35)
Calcium: 10 mg/dL (ref 8.4–10.5)
Chloride: 98 mEq/L (ref 96–112)
GFR, Est Non African American: >89 mL/min
Glucose, Bld: 191 mg/dL — ABNORMAL HIGH (ref 70–99)
Potassium: 3.9 mEq/L (ref 3.5–5.3)
Sodium: 139 mEq/L (ref 135–145)
Total Protein: 7.1 g/dL (ref 6.0–8.3)

## 2013-11-20 LAB — CBC WITH DIFFERENTIAL/PLATELET
Basophils Absolute: 0.1 10*3/uL (ref 0.0–0.1)
Basophils Relative: 1 % (ref 0–1)
EOS ABS: 0.2 10*3/uL (ref 0.0–0.7)
Eosinophils Relative: 3 % (ref 0–5)
HCT: 43.1 % (ref 39.0–52.0)
HEMOGLOBIN: 15.7 g/dL (ref 13.0–17.0)
LYMPHS ABS: 2 10*3/uL (ref 0.7–4.0)
Lymphocytes Relative: 31 % (ref 12–46)
MCH: 34.2 pg — AB (ref 26.0–34.0)
MCHC: 36.4 g/dL — ABNORMAL HIGH (ref 30.0–36.0)
MCV: 93.9 fL (ref 78.0–100.0)
MONOS PCT: 10 % (ref 3–12)
Monocytes Absolute: 0.7 10*3/uL (ref 0.1–1.0)
NEUTROS PCT: 55 % (ref 43–77)
Neutro Abs: 3.6 10*3/uL (ref 1.7–7.7)
PLATELETS: 175 10*3/uL (ref 150–400)
RBC: 4.59 MIL/uL (ref 4.22–5.81)
RDW: 13.9 % (ref 11.5–15.5)
WBC: 6.5 10*3/uL (ref 4.0–10.5)

## 2013-11-20 LAB — RPR

## 2013-11-20 LAB — LIPID PANEL
CHOL/HDL RATIO: 5 ratio
Cholesterol: 179 mg/dL (ref 0–200)
HDL: 36 mg/dL — AB (ref >39)
LDL Cholesterol: 66 mg/dL (ref 0–99)
Triglycerides: 387 mg/dL — ABNORMAL HIGH (ref <150)
VLDL: 77 mg/dL — AB (ref 0–40)

## 2013-11-20 NOTE — Telephone Encounter (Signed)
Pt came by to get norco Rx and I noted it was only for # 30.  He was getting # 32 for the past several months. The dose of tylenol was changed and I am not sure if EPIC diverted back to 30 ??

## 2013-11-20 NOTE — Telephone Encounter (Signed)
Pt informed

## 2013-11-20 NOTE — Telephone Encounter (Signed)
For now, this is ok. His pain is better controlled now that he is on DMARD therapy. If he call and needs additional medication, this can be addressed again.

## 2013-11-21 LAB — HIV-1 RNA QUANT-NO REFLEX-BLD: HIV-1 RNA Quant, Log: <1.3 {Log} (ref <1.30)

## 2013-11-21 LAB — T-HELPER CELL (CD4) - (RCID CLINIC ONLY)
CD4 % Helper T Cell: 39 % (ref 33–55)
CD4 T CELL ABS: 840 /uL (ref 400–2700)

## 2013-11-28 ENCOUNTER — Other Ambulatory Visit: Payer: Self-pay | Admitting: *Deleted

## 2013-11-28 DIAGNOSIS — B20 Human immunodeficiency virus [HIV] disease: Secondary | ICD-10-CM

## 2013-11-28 MED ORDER — EFAVIRENZ-EMTRICITAB-TENOFOVIR 600-200-300 MG PO TABS: 1.0000 | ORAL_TABLET | Freq: Every day | ORAL | Status: DC

## 2013-11-28 NOTE — Progress Notes (Signed)
ADAP application 

## 2013-12-04 ENCOUNTER — Encounter: Payer: Self-pay | Admitting: Infectious Diseases

## 2013-12-04 ENCOUNTER — Ambulatory Visit (INDEPENDENT_AMBULATORY_CARE_PROVIDER_SITE_OTHER): Payer: Commercial Managed Care - HMO | Admitting: Infectious Diseases

## 2013-12-04 VITALS — BP 167/83 | HR 62 | Temp 97.5°F | Ht 69.0 in | Wt 200.0 lb

## 2013-12-04 DIAGNOSIS — I209 Angina pectoris, unspecified: Secondary | ICD-10-CM

## 2013-12-04 DIAGNOSIS — B20 Human immunodeficiency virus [HIV] disease: Secondary | ICD-10-CM

## 2013-12-04 DIAGNOSIS — E119 Type 2 diabetes mellitus without complications: Secondary | ICD-10-CM

## 2013-12-04 DIAGNOSIS — E785 Hyperlipidemia, unspecified: Secondary | ICD-10-CM

## 2013-12-04 DIAGNOSIS — J329 Chronic sinusitis, unspecified: Secondary | ICD-10-CM | POA: Insufficient documentation

## 2013-12-04 DIAGNOSIS — M069 Rheumatoid arthritis, unspecified: Secondary | ICD-10-CM

## 2013-12-04 DIAGNOSIS — J309 Allergic rhinitis, unspecified: Secondary | ICD-10-CM

## 2013-12-04 NOTE — Assessment & Plan Note (Signed)
Greatly appreciate PCP f/u, ? If he needs fibrate.

## 2013-12-04 NOTE — Assessment & Plan Note (Signed)
Will use OTC allergic rx's.

## 2013-12-04 NOTE — Assessment & Plan Note (Signed)
Some difficulty with increased FSG since on prednisone. Hopefully will improve after he comes off prednisone. Greatly apprciate PCP f/u.

## 2013-12-04 NOTE — Progress Notes (Signed)
   Subjective:    Patient ID: Riley Jones, male    DOB: 1955-12-05, 58 y.o.   MRN: [id number]  HPI 58 yo M with hx of HIV+ (~64yrs), DM2 (>85yrs, on insulin) and RA (on MTX, and prednisone for past month). He has been maintained on atripla (has been on multiple previous meds). Also hx of smoking-  Multiple areas of pain, tenderness in muscles, joints. Having chest pain as well, feels like a bruise. Still not sleeping well. Has had CV eval had stress test (nuclear). Only problem has been tachycardia, may get pacer.  Spent 3 weeks in [place] blowing snow ~ 3-5 h /day. Dad was ill and in ICU.   HIV 1 RNA Quant (copies/mL)  Date Value  11/20/2013 <20   05/08/2013 <20   09/12/2012 <20      CD4 T Cell Abs (/uL)  Date Value  11/20/2013 840   05/08/2013 730   09/12/2012 920    Lab Results  Component Value Date   CHOL 179 11/20/2013   HDL 36* 11/20/2013   LDLCALC 66 11/20/2013   TRIG 387* 11/20/2013   CHOLHDL 5.0 11/20/2013     Review of Systems  Constitutional: Negative for appetite change and unexpected weight change.  Gastrointestinal: Positive for diarrhea. Negative for constipation.  Genitourinary: Negative for difficulty urinating.  Musculoskeletal: Positive for arthralgias and myalgias.  Neurological: Positive for numbness and headaches.  Hematological: Bruises/bleeds easily.  FSG has been up as well. Has seen ophtho this year (clean).      Objective:   Physical Exam  Constitutional: He appears well-developed and well-nourished.  HENT:  Mouth/Throat: No oropharyngeal exudate.  Eyes: EOM are normal. Pupils are equal, round, and reactive to light.  Neck: Neck supple.  Cardiovascular: Normal rate, regular rhythm and normal heart sounds.   Pulmonary/Chest: Effort normal and breath sounds normal.  Abdominal: Soft. Bowel sounds are normal. There is no tenderness. There is no rebound.  Lymphadenopathy:    He has no cervical adenopathy.          Assessment & Plan:

## 2013-12-04 NOTE — Assessment & Plan Note (Signed)
He has rheum f/u. May need DMARD. His CD4 seems to be tolerating MTX ok.

## 2013-12-04 NOTE — Assessment & Plan Note (Addendum)
He's doing very well. He is Hep B S Ab+. His flu and pnvx are up to date. He is offered condoms. Will see him back in 6 months.

## 2013-12-11 ENCOUNTER — Telehealth: Payer: Self-pay | Admitting: *Deleted

## 2013-12-11 NOTE — Telephone Encounter (Signed)
MD please advise about refilling Xanax.  Originally started by [facility] MD, Dr. Murlean Caller.

## 2013-12-12 NOTE — Telephone Encounter (Signed)
Should get [facility] refill then.  thanks

## 2013-12-14 ENCOUNTER — Telehealth: Payer: Self-pay | Admitting: *Deleted

## 2013-12-14 DIAGNOSIS — E119 Type 2 diabetes mellitus without complications: Secondary | ICD-10-CM

## 2013-12-14 MED ORDER — INSULIN NPH (HUMAN) (ISOPHANE) 100 UNIT/ML ~~LOC~~ SUSP: 7.0000 [IU] | Freq: Two times a day (BID) | SUBCUTANEOUS | Status: DC

## 2013-12-14 MED ORDER — HYDROCODONE-ACETAMINOPHEN 7.5-325 MG PO TABS: 1.0000 | ORAL_TABLET | Freq: Four times a day (QID) | ORAL | Status: DC | PRN

## 2013-12-14 NOTE — Telephone Encounter (Signed)
Pt's insurance will not cover Lantus so pt is asking to go back to Humulin N vials, that worked well for him in the past.   Also I have one Rx for Norco in the clinic but it's for # 30.  He is asking if you can increase back to # 45 a month.  The meds he takes for RA does not help much.

## 2013-12-15 ENCOUNTER — Other Ambulatory Visit: Payer: Self-pay | Admitting: *Deleted

## 2013-12-15 DIAGNOSIS — F419 Anxiety disorder, unspecified: Secondary | ICD-10-CM

## 2013-12-15 MED ORDER — ALPRAZOLAM 0.5 MG PO TABS: 0.5000 mg | ORAL_TABLET | Freq: Every evening | ORAL | Status: DC | PRN

## 2013-12-15 NOTE — Telephone Encounter (Signed)
Rx called in 

## 2013-12-25 ENCOUNTER — Other Ambulatory Visit: Payer: Self-pay | Admitting: Internal Medicine

## 2013-12-25 DIAGNOSIS — E119 Type 2 diabetes mellitus without complications: Secondary | ICD-10-CM

## 2013-12-25 DIAGNOSIS — M069 Rheumatoid arthritis, unspecified: Secondary | ICD-10-CM

## 2013-12-25 DIAGNOSIS — I10 Essential (primary) hypertension: Secondary | ICD-10-CM

## 2013-12-25 DIAGNOSIS — E785 Hyperlipidemia, unspecified: Secondary | ICD-10-CM

## 2013-12-25 MED ORDER — HYDROCHLOROTHIAZIDE 25 MG PO TABS: 25.0000 mg | ORAL_TABLET | Freq: Every day | ORAL | Status: DC

## 2013-12-28 ENCOUNTER — Ambulatory Visit (HOSPITAL_COMMUNITY)
Admission: RE | Admit: 2013-12-28 | Discharge: 2013-12-28 | Disposition: A | Discharge status: Home / Self Care | Payer: Medicare HMO | Source: Ambulatory Visit | Attending: Internal Medicine | Admitting: Internal Medicine

## 2013-12-28 ENCOUNTER — Ambulatory Visit (INDEPENDENT_AMBULATORY_CARE_PROVIDER_SITE_OTHER): Payer: Medicare HMO | Admitting: Internal Medicine

## 2013-12-28 ENCOUNTER — Other Ambulatory Visit: Payer: Self-pay | Admitting: *Deleted

## 2013-12-28 ENCOUNTER — Encounter: Payer: Self-pay | Admitting: Internal Medicine

## 2013-12-28 VITALS — BP 143/77 | HR 60 | Temp 96.6°F | Ht 69.0 in | Wt 195.9 lb

## 2013-12-28 DIAGNOSIS — J42 Unspecified chronic bronchitis: Secondary | ICD-10-CM | POA: Insufficient documentation

## 2013-12-28 DIAGNOSIS — R0602 Shortness of breath: Secondary | ICD-10-CM | POA: Insufficient documentation

## 2013-12-28 DIAGNOSIS — I10 Essential (primary) hypertension: Secondary | ICD-10-CM

## 2013-12-28 DIAGNOSIS — R05 Cough: Secondary | ICD-10-CM

## 2013-12-28 DIAGNOSIS — F17200 Nicotine dependence, unspecified, uncomplicated: Secondary | ICD-10-CM

## 2013-12-28 DIAGNOSIS — R059 Cough, unspecified: Secondary | ICD-10-CM

## 2013-12-28 DIAGNOSIS — R0600 Dyspnea, unspecified: Secondary | ICD-10-CM

## 2013-12-28 DIAGNOSIS — E119 Type 2 diabetes mellitus without complications: Secondary | ICD-10-CM

## 2013-12-28 LAB — PRO B NATRIURETIC PEPTIDE: Pro B Natriuretic peptide (BNP): 26.86 pg/mL (ref <126)

## 2013-12-28 LAB — POCT GLYCOSYLATED HEMOGLOBIN (HGB A1C): Hemoglobin A1C: 9.2

## 2013-12-28 LAB — GLUCOSE, CAPILLARY: Glucose-Capillary: 194 mg/dL — ABNORMAL HIGH (ref 70–99)

## 2013-12-28 MED ORDER — CLOTRIMAZOLE-BETAMETHASONE 1-0.05 % EX CREA: TOPICAL_CREAM | CUTANEOUS | Status: DC

## 2013-12-28 MED ORDER — AMLODIPINE BESYLATE 10 MG PO TABS: ORAL_TABLET | ORAL | Status: DC

## 2013-12-28 MED ORDER — TERAZOSIN HCL 2 MG PO CAPS: ORAL_CAPSULE | ORAL | Status: DC

## 2013-12-28 NOTE — Progress Notes (Signed)
Subjective:    Patient ID: Riley Jones, male    DOB: 01/05/1956, 58 y.o.   MRN: [id number]  HPI States he had a recent episode of bronchitis, but this has resolved. Continues to smoke and is trying to cut down. He didn't bring all of his pills today, he is missing amlodipine and states this is being refilled. This is why his pressure is elevated. Review of BG log shows that his BG has been elevated mostly fasting, but also at bedtime. No hypoglycemia noted. He has recently been a slow course of steroids, this may have contributed, but I would not expect an A1C change this significant with a short course.     Review of Systems  Constitutional: Positive for appetite change. Negative for fever, chills and fatigue.  HENT: Negative for trouble swallowing and voice change.   Eyes: Negative for visual disturbance.  Respiratory: Positive for cough and shortness of breath. Negative for wheezing.   Cardiovascular: Negative for chest pain, palpitations and leg swelling.  Gastrointestinal: Positive for diarrhea. Negative for vomiting and abdominal pain.  Endocrine: Negative for polydipsia, polyphagia and polyuria.  Genitourinary: Negative for dysuria, urgency, penile swelling and scrotal swelling.  Musculoskeletal: Positive for arthralgias.  Neurological: Negative for dizziness and syncope.  Hematological: Does not bruise/bleed easily.  Psychiatric/Behavioral: Negative for behavioral problems and confusion.       Objective:   Physical Exam  Constitutional: He is oriented to person, place, and time. He appears well-developed and well-nourished. No distress.  HENT:  Head: Normocephalic and atraumatic.  Eyes: Conjunctivae and EOM are normal. Pupils are equal, round, and reactive to light. Right eye exhibits no discharge.  Neck: Normal range of motion. Neck supple. No JVD present. No tracheal deviation present. No thyromegaly present.  Cardiovascular: Normal rate, regular rhythm, normal heart  sounds and intact distal pulses.  Exam reveals no gallop and no friction rub.   No murmur heard. Pulmonary/Chest: Breath sounds normal. No respiratory distress. He has no wheezes.  Abdominal: Soft. Bowel sounds are normal. He exhibits no distension and no mass. There is no tenderness. There is no rebound and no guarding.  Musculoskeletal: Normal range of motion. He exhibits no edema.  Lymphadenopathy:    He has no cervical adenopathy.  Neurological: He is alert and oriented to person, place, and time. He has normal reflexes. No cranial nerve deficit.  Skin: Skin is warm. He is not diaphoretic.  Psychiatric: He has a normal mood and affect. His behavior is normal.   Filed Vitals:   12/28/13 1106  BP: 143/77  Pulse: 60  Temp: 96.6 F (35.9 C)        Assessment & Plan:  58 yr. Old WM w/ pmhx significant for depression, Type 2 IDDM, HTN, HIV followed by ID on HAART, HL, RA, tobacco abuse, presents for follow up.   1) HTN: States he has not taken amlodipine but he has refilled it. This may be why his BP is higher today. Reminded him to take this.  2) Type 2 IDDM: No reported hypoglycemia.HbA1c is 9.2%. Recently changed to Humulin N because he could not afford lantus and prefered this. Increase dose to 10 units twice a day. He has no proteinuria. Increase BG checks to three times a day: morning fasting, before dinner, and before bed. I have asked him to call me if his BG are persistently over 250, as we can slowly increase dose.  3) HIV: On HAART. On followed by ID.   4)  SOB/cough: Likely due to Tobacco use, but given hx, will obtain the following: CXR PA/LAT, pro-BNP, and PFTs.  5) RA: Following with rheumatology. On DMARD therapy. Uses Norco for breakthrough pain.  6) HL: LDL 101. On statin therapy.   7) Depression: Off medication, doing well. Denies any suicidal ideation.   8) Obesity: Body mass index is 28.92 kg/(m^2).Marland Kitchen Educated on diet.   9) Health Maintenance: EGD done 2013  with antral erosions, need colonoscopy report, this has not been sent to me still. I will once again request this. He states the colonoscopy stated he needed repeat in 5 years. Retinal scan performed, no diabetic retinopathy. He is slowly quitting tobacco use.   -return in 1 month.   Dominic Pea, DO, [facility] 12/28/2013, 1:58 PM

## 2013-12-28 NOTE — Patient Instructions (Signed)
-  Increase blood sugar checks to: morning fasting, before dinner, and before bed. -Blood work today. -chest xray today. -I will send you to get pulmonary function tests. -Remember to take your amlodipine. -Return in one month.

## 2014-01-03 ENCOUNTER — Ambulatory Visit (HOSPITAL_COMMUNITY)
Admission: RE | Admit: 2014-01-03 | Discharge: 2014-01-03 | Disposition: A | Discharge status: Home / Self Care | Payer: Medicare HMO | Source: Ambulatory Visit | Attending: Internal Medicine | Admitting: Internal Medicine

## 2014-01-03 DIAGNOSIS — I10 Essential (primary) hypertension: Secondary | ICD-10-CM | POA: Insufficient documentation

## 2014-01-03 DIAGNOSIS — R0600 Dyspnea, unspecified: Secondary | ICD-10-CM

## 2014-01-03 DIAGNOSIS — R0989 Other specified symptoms and signs involving the circulatory and respiratory systems: Principal | ICD-10-CM | POA: Insufficient documentation

## 2014-01-03 DIAGNOSIS — R0609 Other forms of dyspnea: Secondary | ICD-10-CM | POA: Insufficient documentation

## 2014-01-03 DIAGNOSIS — E119 Type 2 diabetes mellitus without complications: Secondary | ICD-10-CM | POA: Insufficient documentation

## 2014-01-03 LAB — PULMONARY FUNCTION TEST
DL/VA % pred: 109 %
DL/VA: 5.08 ml/min/mmHg/L
DLCO unc % pred: 101 %
DLCO unc: 32.83 ml/min/mmHg
FEF 25-75 Post: 1.83 L/sec
FEF 25-75 Pre: 1.63 L/sec
FEF2575-%CHANGE-POST: 12 %
FEF2575-%PRED-POST: 61 %
FEF2575-%Pred-Pre: 54 %
FEV1-%Change-Post: 3 %
FEV1-%Pred-Post: 76 %
FEV1-%Pred-Pre: 73 %
FEV1-PRE: 2.68 L
FEV1-Post: 2.77 L
FEV1FVC-%Change-Post: 0 %
FEV1FVC-%PRED-PRE: 90 %
FEV6-%Change-Post: 2 %
FEV6-%Pred-Post: 86 %
FEV6-%Pred-Pre: 84 %
FEV6-POST: 3.97 L
FEV6-Pre: 3.86 L
FEV6FVC-%CHANGE-POST: 0 %
FEV6FVC-%Pred-Post: 102 %
FEV6FVC-%Pred-Pre: 103 %
FVC-%Change-Post: 3 %
FVC-%Pred-Post: 84 %
FVC-%Pred-Pre: 81 %
FVC-Post: 4.04 L
FVC-Pre: 3.9 L
POST FEV1/FVC RATIO: 68 %
PRE FEV6/FVC RATIO: 99 %
Post FEV6/FVC ratio: 98 %
Pre FEV1/FVC ratio: 69 %
RV % PRED: 123 %
RV: 2.76 L
TLC % pred: 102 %
TLC: 7.14 L

## 2014-01-03 MED ORDER — ALBUTEROL SULFATE (2.5 MG/3ML) 0.083% IN NEBU
2.5000 mg | INHALATION_SOLUTION | Freq: Once | RESPIRATORY_TRACT | Status: AC
  Administered 2014-01-03: 2.5 mg via RESPIRATORY_TRACT

## 2014-01-04 ENCOUNTER — Encounter: Payer: Self-pay | Admitting: Internal Medicine

## 2014-01-04 NOTE — Progress Notes (Signed)
Patient ID: Riley Jones, male   DOB: 05/17/1956, 58 y.o.   MRN: [id number]  Colonoscopy report received performed 08/02/2012. He had a sessile polyp, diverticulosis, and large hemorrhoids. Pathology stated a tubular adenoma. Will need repeat colonoscopy on 2018 (Dr. Carol Ada).  Dominic Pea, DO, [facility] 01/04/2014, 11:47 AM

## 2014-01-09 ENCOUNTER — Ambulatory Visit (INDEPENDENT_AMBULATORY_CARE_PROVIDER_SITE_OTHER): Payer: Medicare HMO | Admitting: Internal Medicine

## 2014-01-09 ENCOUNTER — Encounter: Payer: Self-pay | Admitting: Internal Medicine

## 2014-01-09 VITALS — BP 125/79 | HR 65 | Temp 97.1°F | Ht 69.0 in | Wt 201.2 lb

## 2014-01-09 DIAGNOSIS — J329 Chronic sinusitis, unspecified: Secondary | ICD-10-CM

## 2014-01-09 DIAGNOSIS — J309 Allergic rhinitis, unspecified: Secondary | ICD-10-CM

## 2014-01-09 DIAGNOSIS — J320 Chronic maxillary sinusitis: Secondary | ICD-10-CM

## 2014-01-09 MED ORDER — LORATADINE 10 MG PO TABS: 10.0000 mg | ORAL_TABLET | Freq: Every day | ORAL | Status: DC

## 2014-01-09 NOTE — Assessment & Plan Note (Signed)
Signs and symptoms consistent with chronic sinusitis. Suspect allergic etiology. Given his symptoms on the right side and the swollen inferior nasal turbinates on the right side, suspect his recurrent episodes could be triggered by his nasal pathology.   Plans: Encouraged him to continue nasal saline irrigation 1-2 times a day. Start Claritin once a day If the symptoms persist or worsen or recur more frequently, patient will benefit from ENT referral as surgical treatment could remove the nasal pathology. Discussed with the patient.

## 2014-01-09 NOTE — Patient Instructions (Signed)
Take Claritin as recommended. Use normal Saline water irrigation in both nostrils 1-2 times daily. If your symptoms do not improve or worsen, please give [place] a call. Take all the other medications as advised.

## 2014-01-09 NOTE — Progress Notes (Signed)
Subjective:   Patient ID: Riley Jones male   DOB: 02-02-1956 58 y.o.   MRN: [id number]  HPI: [name] is a 58 y.o. with PMH significant for HTN, DM-II, HIV disease comes to the office with CC of right ear pain for the last several months.  Patient reports that he has had right ear pain for several months now and persisted during the winter. He reports his symptoms are intermittent, with each episode lasting few days and then recur after a week or so. He reports associated right frontal headache and right maxillary pain. He denies any nasal congestion, runny nose, fever, chills, body pains. He denies any discharge from ear and denies any symptoms on the left side.   He denies any other complaints.    Past Medical History  Diagnosis Date  . Depression     Stable, off meds.  . Type II diabetes mellitus     Well controlled   . Hypertension     Well Controlled off meds  . 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 Ola Spurr (ID)  . Hyperlipidemia   . Rheumatoid arthritis(714.0)     Followed by Dr. Charlestine Night, off MTX and prdnisone since 2007  . Atypical angina     Myoview 2003, Normal EF 64%  . Foot pain 03/30/2011   Current Outpatient Prescriptions  Medication Sig Dispense Refill  . albuterol (VENTOLIN HFA) 108 (90 BASE) MCG/ACT inhaler Inhale 1-2 puffs into the lungs every 4 (four) hours as needed. for shortness of breath. Or may use every 6 hours as needed.       . ALPRAZolam (XANAX) 0.5 MG tablet Take 1 tablet (0.5 mg total) by mouth at bedtime as needed for sleep.  30 tablet  1  . amLODipine (NORVASC) 10 MG tablet TAKE ONE TABLET BY MOUTH ONCE DAILY  31 tablet  3  . aspirin 81 MG EC tablet Take 81 mg by mouth daily.        . B-D INS SYRINGE 0.5CC/30GX1/2" 30G X 1/2" 0.5 ML MISC USE 1 SYRINGE NIGHTLY  100 each  2  . clotrimazole-betamethasone (LOTRISONE) cream Apply to affected area 2 times daily  15 g  1  . diphenhydrAMINE  (BENADRYL) 50 MG capsule TAKE ONE CAPSULE BY MOUTH AT BEDTIME AS NEEDED FOR  ITCHING  30 capsule  0  . efavirenz-emtricitabine-tenofovir (ATRIPLA) 600-200-300 MG per tablet Take 1 tablet by mouth daily.  30 tablet  11  . fluticasone (FLONASE) 50 MCG/ACT nasal spray       . folic acid (FOLVITE) 1 MG tablet Take 1 tablet (1 mg total) by mouth daily.  30 tablet  3  . FREESTYLE LITE test strip USE ONE STRIP TO CHECK GLUCOSE TWICE DAILY  100 each  3  . hydrochlorothiazide (HYDRODIURIL) 25 MG tablet Take 1 tablet (25 mg total) by mouth daily.  30 tablet  3  . HYDROcodone-acetaminophen (NORCO) 7.5-325 MG per tablet Take 1 tablet by mouth every 6 (six) hours as needed for moderate pain.  45 tablet  0  . insulin NPH Human (HUMULIN N) 100 UNIT/ML injection Inject 7 Units into the skin 2 (two) times daily before a meal.  10 mL  3  . Insulin Pen Needle 31G X 5 MM MISC Use it for insulin injection  100 each  11  . metFORMIN (GLUCOPHAGE) 1000 MG tablet Take 1 tablet (1,000 mg total) by mouth 2 (two) times daily with  a meal.  180 tablet  1  . methotrexate (RHEUMATREX) 2.5 MG tablet Take 15 mg by mouth once a week. Caution:Chemotherapy. Protect from light.      . Multiple Vitamin (MULTIVITAMIN WITH MINERALS) TABS Take 1 tablet by mouth daily.      Marland Kitchen omeprazole (PRILOSEC) 20 MG capsule Take 1 capsule (20 mg total) by mouth daily.  30 capsule  prn  . pravastatin (PRAVACHOL) 40 MG tablet Take 1 tablet (40 mg total) by mouth at bedtime.  90 tablet  3  . terazosin (HYTRIN) 2 MG capsule TAKE TWO CAPSULES BY MOUTH AT BEDTIME  60 capsule  2   No current facility-administered medications for this visit.   Family History  Problem Relation Age of Onset  . Osteoporosis Mother   . Heart disease Father    History   Social History  . Marital Status: Single    Spouse Name: N/A    Number of Children: N/A  . Years of Education: 13   Occupational History  . Retired Administrator    Social History Main Topics  .  Smoking status: Current Every Day Smoker -- 1.00 packs/day for 42 years    Types: Cigarettes  . Smokeless tobacco: Never Used     Comment: ready to quit. Smoking a little less.  . Alcohol Use: 0.6 oz/week    1 Glasses of wine per week     Comment: occasional wine  . Drug Use: No  . Sexual Activity: No     Comment: given condoms   Other Topics Concern  . None   Social History Narrative   [employer] approved beginning 12/11/2009 - 12/27/2010   [employer] benefits approved; patient eligible for 100% discount for out patient labs and office visits. Patient eligible for 70% discount for other services per Irish Elders 09/08/2010      Patient is on disability, and walks 2-3 times per week.    Review of Systems: Pertinent items are noted in HPI. Objective:  Physical Exam: Filed Vitals:   01/09/14 1120  BP: 125/79  Pulse: 65  Temp: 97.1 F (36.2 C)  TempSrc: Oral  Height: 5\' 9"  (1.753 m)  Weight: 201 lb 3.2 oz (91.264 kg)  SpO2: 98%   Constitutional: Vital signs reviewed.   Patient is a well-developed and well-nourished and in no acute distress and cooperative with exam. Alert and oriented x3.  Head: Normocephalic and atraumatic Ear: TM normal bilaterally Nose: Mild to moderate erythema noted in both nostrils. The inferior turbinates in the right nostril are swollen and almost blocking the nostril. Could not visualize other turbinates on the right nostril. The inferior turbinates are very mildly swollen on the left side. There is no purulent drainage noted in both nostrils.  Mouth: no erythema or exudates, MMM Sinuses: Mild tenderness to palpation over the right frontal, right ethmoidal and right maxillary sinus areas. Cardiovascular: RRR, S1 normal, S2 normal, no MRG, pulses symmetric and intact bilaterally Pulmonary/Chest: normal respiratory effort, CTAB, no wheezes, rales, or rhonchi.  Assessment & Plan:

## 2014-01-11 NOTE — Progress Notes (Signed)
Case discussed with Dr. Boggala at the time of the visit.  We reviewed the resident's history and exam and pertinent patient test results.  I agree with the assessment, diagnosis, and plan of care documented in the resident's note. 

## 2014-01-15 ENCOUNTER — Other Ambulatory Visit: Payer: Self-pay | Admitting: *Deleted

## 2014-01-16 MED ORDER — HYDROCODONE-ACETAMINOPHEN 7.5-325 MG PO TABS: 1.0000 | ORAL_TABLET | Freq: Four times a day (QID) | ORAL | Status: DC | PRN

## 2014-01-16 NOTE — Telephone Encounter (Signed)
Pt informed Rx is ready 

## 2014-01-22 ENCOUNTER — Other Ambulatory Visit: Payer: Self-pay | Admitting: Infectious Diseases

## 2014-01-22 DIAGNOSIS — B20 Human immunodeficiency virus [HIV] disease: Secondary | ICD-10-CM

## 2014-02-12 ENCOUNTER — Other Ambulatory Visit: Payer: Self-pay | Admitting: *Deleted

## 2014-02-12 ENCOUNTER — Other Ambulatory Visit: Payer: Self-pay | Admitting: Internal Medicine

## 2014-02-12 MED ORDER — HYDROCODONE-ACETAMINOPHEN 7.5-325 MG PO TABS: 1.0000 | ORAL_TABLET | Freq: Four times a day (QID) | ORAL | Status: DC | PRN

## 2014-02-12 NOTE — Telephone Encounter (Signed)
Last refill 4/21

## 2014-02-12 NOTE — Telephone Encounter (Signed)
Pt informed Rx is ready for pick up on Wednesday. He will make an appointment with PCP to sign pain contract with in 30 days.

## 2014-02-26 ENCOUNTER — Other Ambulatory Visit: Payer: Self-pay | Admitting: *Deleted

## 2014-02-26 NOTE — Telephone Encounter (Signed)
Pt states he uses syringes twice daily instead of only nightly. Thanks

## 2014-02-27 MED ORDER — "INSULIN SYRINGE-NEEDLE U-100 30G X 1/2"" 0.5 ML MISC": Status: DC

## 2014-03-09 ENCOUNTER — Other Ambulatory Visit: Payer: Self-pay | Admitting: Internal Medicine

## 2014-03-09 ENCOUNTER — Ambulatory Visit (INDEPENDENT_AMBULATORY_CARE_PROVIDER_SITE_OTHER): Payer: Commercial Managed Care - HMO | Admitting: Internal Medicine

## 2014-03-09 ENCOUNTER — Encounter: Payer: Self-pay | Admitting: Internal Medicine

## 2014-03-09 ENCOUNTER — Ambulatory Visit: Payer: Commercial Managed Care - HMO | Admitting: Dietician

## 2014-03-09 VITALS — BP 133/74 | HR 68 | Temp 96.7°F | Resp 20 | Ht 70.0 in | Wt 201.7 lb

## 2014-03-09 DIAGNOSIS — F17200 Nicotine dependence, unspecified, uncomplicated: Secondary | ICD-10-CM

## 2014-03-09 DIAGNOSIS — I10 Essential (primary) hypertension: Secondary | ICD-10-CM

## 2014-03-09 DIAGNOSIS — J329 Chronic sinusitis, unspecified: Secondary | ICD-10-CM

## 2014-03-09 DIAGNOSIS — R197 Diarrhea, unspecified: Secondary | ICD-10-CM

## 2014-03-09 DIAGNOSIS — H6692 Otitis media, unspecified, left ear: Secondary | ICD-10-CM | POA: Insufficient documentation

## 2014-03-09 DIAGNOSIS — H6690 Otitis media, unspecified, unspecified ear: Secondary | ICD-10-CM

## 2014-03-09 DIAGNOSIS — Z0000 Encounter for general adult medical examination without abnormal findings: Secondary | ICD-10-CM

## 2014-03-09 DIAGNOSIS — E119 Type 2 diabetes mellitus without complications: Secondary | ICD-10-CM

## 2014-03-09 LAB — GLUCOSE, CAPILLARY: Glucose-Capillary: 165 mg/dL — ABNORMAL HIGH (ref 70–99)

## 2014-03-09 MED ORDER — DOXYCYCLINE HYCLATE 100 MG PO TABS: 100.0000 mg | ORAL_TABLET | Freq: Two times a day (BID) | ORAL | Status: DC

## 2014-03-09 MED ORDER — LORATADINE 10 MG PO TABS: 10.0000 mg | ORAL_TABLET | Freq: Every day | ORAL | Status: DC

## 2014-03-09 NOTE — Assessment & Plan Note (Signed)
  Assessment: Progress toward smoking cessation:  smoking the same amount (1pack per day) Barriers to progress toward smoking cessation:    Comments: 1 ppd, no motivation to quit at this time but says has cut down  Plan: Instruction/counseling given:  I counseled patient on the dangers of tobacco use, advised patient to stop smoking, and reviewed strategies to maximize success. Educational resources provided:  [employer] account manager) brochure Self management tools provided:  smoking cessation plan (STAR Quit Plan) Medications to assist with smoking cessation:  None Patient agreed to the following self-care plans for smoking cessation:    Other plans: allergy noted to chantix

## 2014-03-09 NOTE — Assessment & Plan Note (Signed)
x1 month, almost daily especially after food. Reports it to be watery, denies blood in stool. Unclear etiology. Possible medication induced (on metformin) vs. Bacterial etiology (no fever or chills however, but has been on doxy in the past) vs. Viral (has HIV but well controlled, on atripla). Temp improvement with immodium.   -hold metformin for a couple of days to see if any improvement -return stool sample for stool studies--culture, fat, lactoferrin -follow up with ID as well

## 2014-03-09 NOTE — Assessment & Plan Note (Addendum)
Multiple episodes, currently severe congestion, pain in left ear radiating to jaw and teeth and sinus pressure. Likely has allergic component given watery eyes, rhinorrhea at times, and sneezing and improvement with claritin in the past.  However, concern for further work up needed given uncontrolled DM2 and HIV with hx of nasal septum deviation surgery, tonsillectomy, uvulectomy, and enlarged cervical lymphadenopathy along with inflamed nasal turbinates.   -will refer to ENT -start doxycycline 100mg  po bid for 7 days initially but may need to extend to 10 days if no improvement by day 5, patient is asked to call in if no improvement -continue flonase as tolerated -restart daily claritin  -advised to return to clinic sooner or ER if symptoms worsen

## 2014-03-09 NOTE — Patient Instructions (Signed)
General Instructions:  Please start doxycycline 139m by mouth twice a day for 7 days, if symptoms still present by day 5 call [place] we may extend the antibiotics for total 10 days. Make sure to stay upright for at least 30 minutes after taking the medication  Please restart daily claritin  The clinic will call you when the ENT referral is made  Try holding your metformin for the next couple of days to see if your diarrhea resolves. In the meantime, please bring back stool sample to the lab for culture analysis  Please schedule an earlier appointment with Dr. HJohnnye Simagiven the persistent diarrhea  Please stop smoking  If you start having fever, chills, chest pain, worsening of your infection, call [place] away and if severe go to the emergency room  Please send back your stool cards as well  Thank you for bringing your medicines today. This helps u[place] you safe from mistakes.   Progress Toward Treatment Goals:  Treatment Goal 03/09/2014  Hemoglobin A1C deteriorated  Blood pressure at goal  Stop smoking smoking the same amount    Self Care Goals & Plans:  Self Care Goal 03/09/2014  Manage my medications take my medicines as prescribed; bring my medications to every visit  Monitor my health bring my glucose meter and log to each visit  Eat healthy foods -  Be physically active -  Stop smoking -  Prevent falls -    Home Blood Glucose Monitoring 03/09/2014  Check my blood sugar 3 times a day  When to check my blood sugar before meals    Care Management & Community Referrals:  No flowsheet data found.    Doxycycline tablets or capsules What is this medicine? DOXYCYCLINE (dox i SYE kleen) is a tetracycline antibiotic. It kills certain bacteria or stops their growth. It is used to treat many kinds of infections, like dental, skin, respiratory, and urinary tract infections. It also treats acne, Lyme disease, malaria, and certain sexually transmitted infections. This  medicine may be used for other purposes; ask your health care provider or pharmacist if you have questions. COMMON BRAND NAME(S): Adoxa CK, Adoxa Pak, Adoxa TT, Adoxa, Alodox, Avidoxy, Doxal, Monodox, Morgidox 1x Kit, Morgidox 1x, Morgidox 2x , Morgidox 2x Kit, Ocudox , Vibra-Tabs, Vibramycin What should I tell my health care provider before I take this medicine? They need to know if you have any of these conditions: -liver disease -long exposure to sunlight like working outdoors -stomach problems like colitis -an unusual or allergic reaction to doxycycline, tetracycline antibiotics, other medicines, foods, dyes, or preservatives -pregnant or trying to get pregnant -breast-feeding How should I use this medicine? Take this medicine by mouth with a full glass of water. Follow the directions on the prescription label. It is best to take this medicine without food, but if it upsets your stomach take it with food. Take your medicine at regular intervals. Do not take your medicine more often than directed. Take all of your medicine as directed even if you think you are better. Do not skip doses or stop your medicine early. Talk to your pediatrician regarding the use of this medicine in children. Special care may be needed. While this drug may be prescribed for children as young as 845years old for selected conditions, precautions do apply. Overdosage: If you think you have taken too much of this medicine contact a poison control center or emergency room at once. NOTE: This medicine is only for you. Do  not share this medicine with others. What if I miss a dose? If you miss a dose, take it as soon as you can. If it is almost time for your next dose, take only that dose. Do not take double or extra doses. What may interact with this medicine? -antacids -barbiturates -birth control pills -bismuth subsalicylate -carbamazepine -methoxyflurane -other antibiotics -phenytoin -vitamins that contain  iron -warfarin This list may not describe all possible interactions. Give your health care provider a list of all the medicines, herbs, non-prescription drugs, or dietary supplements you use. Also tell them if you smoke, drink alcohol, or use illegal drugs. Some items may interact with your medicine. What should I watch for while using this medicine? Tell your doctor or health care professional if your symptoms do not improve. Do not treat diarrhea with over the counter products. Contact your doctor if you have diarrhea that lasts more than 2 days or if it is severe and watery. Do not take this medicine just before going to bed. It may not dissolve properly when you lay down and can cause pain in your throat. Drink plenty of fluids while taking this medicine to also help reduce irritation in your throat. This medicine can make you more sensitive to the sun. Keep out of the sun. If you cannot avoid being in the sun, wear protective clothing and use sunscreen. Do not use sun lamps or tanning beds/booths. Birth control pills may not work properly while you are taking this medicine. Talk to your doctor about using an extra method of birth control. If you are being treated for a sexually transmitted infection, avoid sexual contact until you have finished your treatment. Your sexual partner may also need treatment. Avoid antacids, aluminum, calcium, magnesium, and iron products for 4 hours before and 2 hours after taking a dose of this medicine. If you are using this medicine to prevent malaria, you should still protect yourself from contact with mosquitos. Stay in screened-in areas, use mosquito nets, keep your body covered, and use an insect repellent. What side effects may I notice from receiving this medicine? Side effects that you should report to your doctor or health care professional as soon as possible: -allergic reactions like skin rash, itching or hives, swelling of the face, lips, or  tongue -difficulty breathing -fever -itching in the rectal or genital area -pain on swallowing -redness, blistering, peeling or loosening of the skin, including inside the mouth -severe stomach pain or cramps -unusual bleeding or bruising -unusually weak or tired -yellowing of the eyes or skin Side effects that usually do not require medical attention (report to your doctor or health care professional if they continue or are bothersome): -diarrhea -loss of appetite -nausea, vomiting This list may not describe all possible side effects. Call your doctor for medical advice about side effects. You may report side effects to FDA at 1-800-FDA-1088. Where should I keep my medicine? Keep out of the reach of children. Store at room temperature, below 30 degrees C (86 degrees F). Protect from light. Keep container tightly closed. Throw away any unused medicine after the expiration date. Taking this medicine after the expiration date can make you seriously ill. NOTE: This sheet is a summary. It may not cover all possible information. If you have questions about this medicine, talk to your doctor, pharmacist, or health care provider.  2014, Elsevier/Gold Standard. (2008-01-03 16:53:02)  Sinusitis Sinusitis is redness, soreness, and puffiness (inflammation) of the air pockets in the bones of your face (  sinuses). The redness, soreness, and puffiness can cause air and mucus to get trapped in your sinuses. This can allow germs to grow and cause an infection.  HOME CARE   Drink enough fluids to keep your pee (urine) clear or pale yellow.  Use a humidifier in your home.  Run a hot shower to create steam in the bathroom. Sit in the bathroom with the door closed. Breathe in the steam 3 4 times a day.  Put a warm, moist washcloth on your face 3 4 times a day, or as told by your doctor.  Use salt water sprays (saline sprays) to wet the thick fluid in your nose. This can help the sinuses drain.  Only  take medicine as told by your doctor. GET HELP RIGHT AWAY IF:   Your pain gets worse.  You have very bad headaches.  You are sick to your stomach (nauseous).  You throw up (vomit).  You are very sleepy (drowsy) all the time.  Your face is puffy (swollen).  Your vision changes.  You have a stiff neck.  You have trouble breathing. MAKE SURE YOU:   Understand these instructions.  Will watch your condition.  Will get help right away if you are not doing well or get worse. Document Released: 03/02/2008 Document Revised: 06/08/2012 Document Reviewed: 04/19/2012 [facility] Patient Information 2014 [place].

## 2014-03-09 NOTE — Assessment & Plan Note (Signed)
Stool cards given and urine microalbumin checked today

## 2014-03-09 NOTE — Assessment & Plan Note (Signed)
Lab Results  Component Value Date   HGBA1C 9.2 12/28/2013   HGBA1C 6.9 09/08/2013   HGBA1C 7.4 03/09/2013    Assessment: Diabetes control: poor control (HgbA1C >9%) Progress toward A1C goal:  deteriorated Comments: recheck a1c July 2015  Plan: Medications:  continue current medications metformin 1000mg  bid (will hold for a couple of days given diarrhea), nph 7 units bid Home glucose monitoring: Frequency: 3 times a day Timing: before meals Instruction/counseling given: reminded to get eye exam, reminded to bring blood glucose meter & log to each visit and reminded to bring medications to each visit Educational resources provided: brochure Self management tools provided: copy of home glucose meter download Other plans: brought meter today, cbgs 150-220s

## 2014-03-09 NOTE — Addendum Note (Signed)
Addended by: Orson Gear on: 03/09/2014 01:52 PM   Modules accepted: Orders

## 2014-03-09 NOTE — Progress Notes (Signed)
Subjective:   Patient ID: Riley Jones male   DOB: 09/16/1956 58 y.o.   MRN: [id number]  HPI: [name] is a 58 y.o. male with DM2, HIV, HTN, RA, and depression presenting to opc today for an acute visit with complaints of recurrent sinusitis. He was last seen for presumed allergic sinusitis on 12/2013 by Dr. Eyvonne Jones and started on claritin with nasal saline irrigation. He reports improvement of symptoms after starting claritin and discontinued use after symptoms resolved. Endorses duration >1 week, multiple similar occurrences, pain in left ear, radiating to jaw and teeth, "feels like face is on fire", puffy eyes yesterday, congestion, watery eyes, sneezing, pressure on face, constant. Denies fever, chills, nausea, or vomiting. Has been treated successfully in the past with doxy given PCN allergy.  Diarrhea--x1 month, always after eating. Improves with immodium briefly then returns after a few days. On metformin.   L ear pain--associated with sinus pressure, feels like there is water in the ear and painful. Denies trauma to area.   Smoking--down to 1 pack per day, counseled on cessation, not ready to quit at this time  HIV--well controlled, follows with Dr. Johnnye Jones. Last count 10/2013 840. On Atripla.    Health maintenance--last colo apparently 09/2011 and says he was told to return in 5 years. Due for fobt stool cards, will try to give today.   DM2--last A1C 9.2 12/2013, CBGs ranging from 150-200. Compliant with insulin.   Past Medical History  Diagnosis Date  . Depression     Stable, off meds.  . Type II diabetes mellitus     Well controlled   . Hypertension     Well Controlled off meds  . 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 Ola Jones (ID)  . Hyperlipidemia   . Rheumatoid arthritis(714.0)     Followed by Dr. Charlestine Jones, off MTX and prdnisone since 2007  . Atypical angina     Myoview 2003, Normal EF 64%  . Foot pain 03/30/2011    Current Outpatient Prescriptions  Medication Sig Dispense Refill  . ALPRAZolam (XANAX) 0.5 MG tablet Take 1 tablet (0.5 mg total) by mouth at bedtime as needed for sleep.  30 tablet  1  . amLODipine (NORVASC) 10 MG tablet TAKE ONE TABLET BY MOUTH ONCE DAILY  31 tablet  3  . aspirin 81 MG EC tablet Take 81 mg by mouth daily.        . ATRIPLA 600-200-300 MG per tablet TAKE 1 TABLET BY MOUTH DAILY  30 tablet  5  . clotrimazole-betamethasone (LOTRISONE) cream Apply to affected area 2 times daily  15 g  1  . fluticasone (FLONASE) 50 MCG/ACT nasal spray       . folic acid (FOLVITE) 1 MG tablet Take 1 tablet (1 mg total) by mouth daily.  30 tablet  3  . FREESTYLE LITE test strip USE ONE STRIP TO CHECK GLUCOSE TWICE DAILY  100 each  3  . hydrochlorothiazide (HYDRODIURIL) 25 MG tablet Take 1 tablet (25 mg total) by mouth daily.  30 tablet  3  . HYDROcodone-acetaminophen (NORCO) 7.5-325 MG per tablet Take 1 tablet by mouth every 6 (six) hours as needed for moderate pain.  60 tablet  0  . insulin NPH Human (HUMULIN N) 100 UNIT/ML injection Inject 7 Units into the skin 2 (two) times daily before a meal.  10 mL  3  . Insulin Pen Needle 31G X 5 MM MISC Use  it for insulin injection  100 each  11  . Insulin Syringe-Needle U-100 (B-D INS SYRINGE 0.5CC/30GX1/2") 30G X 1/2" 0.5 ML MISC USE 1 SYRINGE IN AM AND I IN THE EVENING. DX CODE: 250.00.  100 each  2  . metFORMIN (GLUCOPHAGE) 1000 MG tablet TAKE ONE TABLET BY MOUTH TWICE DAILY WITH A MEAL  180 tablet  0  . methotrexate (RHEUMATREX) 2.5 MG tablet Take 15 mg by mouth once a week. Caution:Chemotherapy. Protect from light.      . Multiple Vitamin (MULTIVITAMIN WITH MINERALS) TABS Take 1 tablet by mouth daily.      Marland Kitchen omeprazole (PRILOSEC) 20 MG capsule Take 1 capsule (20 mg total) by mouth daily.  30 capsule  prn  . pravastatin (PRAVACHOL) 40 MG tablet Take 1 tablet (40 mg total) by mouth at bedtime.  90 tablet  3  . terazosin (HYTRIN) 2 MG capsule TAKE TWO  CAPSULES BY MOUTH AT BEDTIME  60 capsule  2  . albuterol (VENTOLIN HFA) 108 (90 BASE) MCG/ACT inhaler Inhale 1-2 puffs into the lungs every 4 (four) hours as needed. for shortness of breath. Or may use every 6 hours as needed.       . diphenhydrAMINE (BENADRYL) 50 MG capsule TAKE ONE CAPSULE BY MOUTH AT BEDTIME AS NEEDED FOR  ITCHING  30 capsule  0  . doxycycline (VIBRA-TABS) 100 MG tablet Take 1 tablet (100 mg total) by mouth 2 (two) times daily.  14 tablet  0  . loratadine (CLARITIN) 10 MG tablet Take 1 tablet (10 mg total) by mouth daily.  30 tablet  1   No current facility-administered medications for this visit.   Family History  Problem Relation Age of Onset  . Osteoporosis Mother   . Heart disease Father    History   Social History  . Marital Status: Single    Spouse Name: N/A    Number of Children: N/A  . Years of Education: 13   Occupational History  . Retired Administrator    Social History Main Topics  . Smoking status: Current Every Day Smoker -- 1.00 packs/day for 42 years    Types: Cigarettes  . Smokeless tobacco: Never Used     Comment: ready to quit. Smoking a little less.  . Alcohol Use: 0.6 oz/week    1 Glasses of wine per week     Comment: occasional wine  . Drug Use: No  . Sexual Activity: No     Comment: given condoms   Other Topics Concern  . None   Social History Narrative   [employer] approved beginning 12/11/2009 - 12/27/2010   Riley Jones benefits approved; patient eligible for 100% discount for out patient labs and office visits. Patient eligible for 70% discount for other services per Riley Jones 09/08/2010      Patient is on disability, and walks 2-3 times per week.    Review of Systems:  Constitutional:  Denies fever, chills  HEENT:  Chronic sinusitis, L ear pain  Respiratory:  Denies SOB, DO. Chronic cough  Cardiovascular:  Denies chest pain and leg swelling.   Gastrointestinal:  Denies nausea, vomiting. Occasional abdominal pain and daily  diarrhea  Genitourinary:  Denies dysuria   Musculoskeletal:  Hx of RA per patient, diffuse body pain  Skin:  Denies pallor, rash and wound.   Neurological:  Denies syncope. Intermittent headaches   Objective:  Physical Exam: Filed Vitals:   03/09/14 1010  BP: 133/74  Pulse: 68  Temp: 96.7  F (35.9 C)  TempSrc: Oral  Resp: 20  Height: 5\' 10"  (1.778 m)  Weight: 201 lb 11.2 oz (91.491 kg)  SpO2: 97%   Vitals reviewed. General: sitting in chair, NAD HEENT: EOMI, slight inflammation around b/l eyes, tenderness to palpation b/l maxillary sinuses and near b/l ears, mild frontal sinus pressure, nasal congestion, worse on left, inflamed inferior nasal turbinates, L worse than right with significant erythema. R ear clear with no visible erythema or pus, L ear with tenderness on examination, external and inner erythema, wet appearance, mild tenderness with palpation of mastoid, no tenderness with palpation of tragus or movement of helix Neck: b/l tender large cervical lymphadenopathy Cardiac: RRR, no rubs, murmurs or gallops Pulm: clear to auscultation bilaterally, no wheezes, rales, or rhonchi Abd: soft, nondistended, BS present Ext: warm and well perfused, no pedal edema, chronic shoulder pain L>R Neuro: alert and oriented X3, strength and sensation to light touch equal in bilateral upper and lower extremities  Assessment & Plan:  Discussed with Dr. Daryll Drown Recurrent chronic sinusitis and LAOM--allergic component but concern given poorly controlled DM and HIV. Referred to ENT today, restarted claritin, 7 day course of doxy (PCN allergy, also likely AOM), may need to extend to 10 days Diarrhea--will return for stool sample, stool culture and studies ordered as future encounter

## 2014-03-09 NOTE — Assessment & Plan Note (Signed)
BP Readings from Last 3 Encounters:  03/09/14 133/74  03/09/14 133/74  01/09/14 125/79   Lab Results  Component Value Date   NA 139 11/20/2013   K 3.9 11/20/2013   CREATININE 0.86 11/20/2013   Assessment: Blood pressure control: controlled Progress toward BP goal:  at goal  Plan: Medications:  continue current medications hctz 25mg  and norvasc 10mg  and terazosin 2mg 

## 2014-03-09 NOTE — Assessment & Plan Note (Signed)
In conjunction with recurrent chronic sinusitis. Allergic to PCN. Success with doxy in the past.  -start doxycycline 100mg  po bid x7 days, may need to extend to 10 days if no improvement by day 5 -ENT referral, given recurrent symptoms, HIV, DM2

## 2014-03-10 LAB — MICROALBUMIN / CREATININE URINE RATIO
Creatinine, Urine: 49.2 mg/dL
Microalb Creat Ratio: 10.2 mg/g (ref 0.0–30.0)
Microalb, Ur: 0.5 mg/dL (ref 0.00–1.89)

## 2014-03-12 ENCOUNTER — Other Ambulatory Visit (INDEPENDENT_AMBULATORY_CARE_PROVIDER_SITE_OTHER): Payer: Commercial Managed Care - HMO

## 2014-03-12 DIAGNOSIS — Z1211 Encounter for screening for malignant neoplasm of colon: Secondary | ICD-10-CM

## 2014-03-12 DIAGNOSIS — R197 Diarrhea, unspecified: Secondary | ICD-10-CM

## 2014-03-12 LAB — POC HEMOCCULT BLD/STL (HOME/3-CARD/SCREEN)
Card #2 Fecal Occult Blod, POC: NEGATIVE
FECAL OCCULT BLD: NEGATIVE
Fecal Occult Blood, POC: NEGATIVE

## 2014-03-12 NOTE — Progress Notes (Signed)
Case discussed with Dr. Qureshi at the time of the visit.  We reviewed the resident's history and exam and pertinent patient test results.  I agree with the assessment, diagnosis, and plan of care documented in the resident's note. 

## 2014-03-12 NOTE — Progress Notes (Signed)
Patient did not need an eye exam.

## 2014-03-13 LAB — FECAL FAT QUALITATIVE
Free Fatty Acids: NORMAL
NEUTRAL FAT: NORMAL

## 2014-03-13 LAB — OVA AND PARASITE EXAMINATION: OP: NONE SEEN

## 2014-03-13 LAB — FECAL LACTOFERRIN, QUANT: LACTOFERRIN: NEGATIVE

## 2014-03-16 ENCOUNTER — Other Ambulatory Visit: Payer: Self-pay | Admitting: Internal Medicine

## 2014-03-16 LAB — STOOL CULTURE

## 2014-03-19 MED ORDER — HYDROCODONE-ACETAMINOPHEN 7.5-325 MG PO TABS: 1.0000 | ORAL_TABLET | Freq: Four times a day (QID) | ORAL | Status: DC | PRN

## 2014-03-19 NOTE — Telephone Encounter (Signed)
I have refilled his prescription for now but I can not refill another prescription till we see him in clinic. He needs a medication contract if he is receiving pain meds and we have to assess and see if they are appropriate to continue

## 2014-03-26 ENCOUNTER — Other Ambulatory Visit: Payer: Self-pay | Admitting: Internal Medicine

## 2014-03-29 ENCOUNTER — Other Ambulatory Visit: Payer: Self-pay | Admitting: Internal Medicine

## 2014-03-29 NOTE — Telephone Encounter (Signed)
Rx called into [facility]

## 2014-04-02 ENCOUNTER — Ambulatory Visit (INDEPENDENT_AMBULATORY_CARE_PROVIDER_SITE_OTHER): Payer: Commercial Managed Care - HMO | Admitting: Internal Medicine

## 2014-04-02 ENCOUNTER — Ambulatory Visit (HOSPITAL_COMMUNITY)
Admission: RE | Admit: 2014-04-02 | Discharge: 2014-04-02 | Disposition: A | Discharge status: Home / Self Care | Payer: Medicare HMO | Source: Ambulatory Visit | Attending: Internal Medicine | Admitting: Internal Medicine

## 2014-04-02 ENCOUNTER — Encounter: Payer: Self-pay | Admitting: Internal Medicine

## 2014-04-02 VITALS — BP 118/78 | HR 77 | Temp 97.1°F | Ht 70.0 in | Wt 202.6 lb

## 2014-04-02 DIAGNOSIS — R0789 Other chest pain: Secondary | ICD-10-CM | POA: Insufficient documentation

## 2014-04-02 DIAGNOSIS — I209 Angina pectoris, unspecified: Secondary | ICD-10-CM

## 2014-04-02 DIAGNOSIS — E119 Type 2 diabetes mellitus without complications: Secondary | ICD-10-CM

## 2014-04-02 DIAGNOSIS — J329 Chronic sinusitis, unspecified: Secondary | ICD-10-CM

## 2014-04-02 DIAGNOSIS — F17200 Nicotine dependence, unspecified, uncomplicated: Secondary | ICD-10-CM

## 2014-04-02 LAB — GLUCOSE, CAPILLARY: Glucose-Capillary: 200 mg/dL — ABNORMAL HIGH (ref 70–99)

## 2014-04-02 LAB — POCT GLYCOSYLATED HEMOGLOBIN (HGB A1C): Hemoglobin A1C: 8.2

## 2014-04-02 MED ORDER — BUPROPION HCL ER (SR) 150 MG PO TB12: 150.0000 mg | ORAL_TABLET | Freq: Every day | ORAL | Status: DC

## 2014-04-02 MED ORDER — BUPROPION HCL ER (SR) 150 MG PO TB12: 150.0000 mg | ORAL_TABLET | Freq: Two times a day (BID) | ORAL | Status: DC

## 2014-04-02 NOTE — Progress Notes (Signed)
   Subjective:    Patient ID: Riley Jones, male    DOB: [birth year], 58 y.o.   MRN: [id number]  HPI Comments: Ms. Tirado is a 58 yo woman with a PMH HTN, HIV (CD4 840, VL < 20), DM type 2 (HgbA1c 9.28 December 2013), COPD, RA and tobacco use disorder.  He is here for c/o sinusitis.  Was prescribed claritin and flonase in April but has not taken them both consistently.  Continued to have symptoms and given his hx of DM, HIV he was prescribed a course of doxycycline 3 weeks ago and referred to ENT.  The ENT appointment is still being arranged (issues with insurance pre-auth).  He completed antibiotic but his symptoms persist.  He denies fevers but says he sometimes feels cold at home.  He had a sore throat yesterday which has resolved today.  He continues to have postnasal drip, rhinorrhea (clear, non-purulent), sinus pressure and cough.  He has not had change in vision.   He has smoked 1 PPD for about 50 years.  Quit for five years with Wellbutrin.  Had ADRs with Chantix.    He resumed smoking after the death of a family member.   He would like to try Wellbutrin for tobacco cessation because this has helped in the past.          Review of Systems  Constitutional: Positive for chills. Negative for fever.  HENT: Positive for postnasal drip, rhinorrhea, sinus pressure and sore throat. Negative for sneezing.   Eyes: Negative for visual disturbance.  Respiratory: Positive for cough.   Cardiovascular: Negative for chest pain, palpitations and leg swelling.  Gastrointestinal: Positive for diarrhea. Negative for abdominal pain, constipation and blood in stool.       Chronic diarrhea x several months.  Genitourinary: Negative for dysuria, frequency and hematuria.  Neurological: Negative for syncope.       Objective:   Physical Exam  Vitals reviewed. Constitutional: He is oriented to person, place, and time. He appears well-developed. No distress.  HENT:  Mouth/Throat: Oropharynx is clear and  moist. No oropharyngeal exudate.  Nasal erythema and slight edema (R>L), no necrosis. No periorbital edema or proptosis.  Eyes: EOM are normal. Pupils are equal, round, and reactive to light.  Neck: Normal range of motion. Neck supple.  Cardiovascular: Normal rate, regular rhythm and normal heart sounds.  Exam reveals no gallop and no friction rub.   No murmur heard. Pulmonary/Chest: Effort normal and breath sounds normal. No respiratory distress. He has no wheezes. He has no rales.  Abdominal: Soft. Bowel sounds are normal. He exhibits no distension. There is no tenderness.  Musculoskeletal: Normal range of motion.  Neurological: He is alert and oriented to person, place, and time. No cranial nerve deficit.  Normal sensation B/L feet.  Intact toe proprioception and ankle jerk.  Skin: Skin is warm. He is not diaphoretic.  No ulcers or wounds on feet.  Psychiatric: He has a normal mood and affect. His behavior is normal.          Assessment & Plan:  Please see problem based assessment and plan.

## 2014-04-02 NOTE — Patient Instructions (Signed)
1. Please take Claritin and Flonase everyday.  If you develop fever, eye pain or vision problems or worsening nasal congestion or purulent discharge return to clinic or seek emergency treatment.    2. Schedule an appointment with Cardiology.   3. Please take all medications as prescribed.    4. If you have worsening of your symptoms or new symptoms arise, please call the clinic ([phone]), or go to the ER immediately if symptoms are severe.   Please return in 1 month for follow-up.

## 2014-04-02 NOTE — Assessment & Plan Note (Addendum)
Continues to have congestion and facial pain, though no sinus tenderness on my exam today.  No necrosis, fever, purulent discharge, vision abnormalities or rapid progression to suggest mucor.  His symptoms seem consistent with allergic sinusitis.  - begin taking Flonase and Claritin daily - return to clinic in 1 month or sooner if symptoms worsen - can consider imaging if no improvement with allergy rx - go to ER with severe symptoms including fever, vision abnormalities, eye pain or purulent nasal discharge

## 2014-04-02 NOTE — Assessment & Plan Note (Addendum)
On ROS he reports occasional sharp chest pain lasting seconds that occurs both at rest and with exertion.  No associated symptoms.  It resolves spontaneously and with rest.  He does not report indigestion or heartburn.  He had a normal stress test in 2013.  No chest pain at present. - EKG this morning is similar to prior with t-wave inversions in lateral leads - chest pain is atypical but will refer back to cardiology given risk factors (HTN, HLD, DM, smoking) - patient advised to go to ED for worsening symptoms (if chest pain worsens, persists or if accompanied by N/V, diaphoresis, dyspnea).

## 2014-04-02 NOTE — Assessment & Plan Note (Addendum)
Patient is requesting Wellbutrin and says this has worked for him in the past. - Wellbutrin 150mg  daily x 3 days, then 150mg  BID - return to clinic in 1 month for follow-up

## 2014-04-03 NOTE — Progress Notes (Signed)
INTERNAL MEDICINE TEACHING ATTENDING ADDENDUM - Neeti Knudtson, MD: I reviewed and discussed at the time of visit with the resident Dr. Wilson, the patient's medical history, physical examination, diagnosis and results of pertinent tests and treatment and I agree with the patient's care as documented.  

## 2014-04-05 ENCOUNTER — Other Ambulatory Visit: Payer: Self-pay | Admitting: Internal Medicine

## 2014-04-06 ENCOUNTER — Ambulatory Visit (INDEPENDENT_AMBULATORY_CARE_PROVIDER_SITE_OTHER): Payer: Commercial Managed Care - HMO | Admitting: Internal Medicine

## 2014-04-06 ENCOUNTER — Other Ambulatory Visit: Payer: Self-pay | Admitting: *Deleted

## 2014-04-06 ENCOUNTER — Encounter: Payer: Self-pay | Admitting: Internal Medicine

## 2014-04-06 VITALS — BP 117/70 | HR 82 | Temp 98.0°F | Ht 70.0 in | Wt 202.3 lb

## 2014-04-06 DIAGNOSIS — J329 Chronic sinusitis, unspecified: Secondary | ICD-10-CM

## 2014-04-06 MED ORDER — LEVOFLOXACIN 500 MG PO TABS: 500.0000 mg | ORAL_TABLET | Freq: Every day | ORAL | Status: DC

## 2014-04-06 NOTE — Telephone Encounter (Signed)
He finished a course and I don't think that additional antibiotics are going to help. Please ask him to make a nother appointment with the clinic. He will likely need imaging of his sinuses

## 2014-04-06 NOTE — Patient Instructions (Signed)
1. You have hospital follow up appointments as follows:   2. Please take all medications as prescribed.  3. If you have worsening of your symptoms or new symptoms arise, please call the clinic ([phone]), or go to the ER immediately if symptoms are severe.

## 2014-04-06 NOTE — Progress Notes (Signed)
   Subjective:    Patient ID: Riley Jones, male    DOB: [birth year], 58 y.o.   MRN: [id number]  HPI Comments: Ms. Splawn is a 58 yo woman with a PMH HTN, HIV (CD4 840, VL < 20), DM type 2 (HgbA1c 9.28 December 2013), COPD, RA and tobacco use disorder.  Here with continued sinus congestion, dry cough, clear rhinorrhea (no purulrence), left ear and jaw pain.  His symptoms have been present for several months.  No sore throat.  Positive dyspnea in recent days.  No fever but breaks out in sweats for past few weeks.  He was last seen in clinic four days ago.  No help with claritin, flonase or mucinex. Called out to work twice this week because he feels so ill.  No sick contacts prior to onset of symptoms.  He was put on Doxycycline last month but feels it did not really help.  He has been referred to ENT but is awaiting prior-authorization.       Review of Systems  Constitutional: Negative for fever, chills and appetite change.  Eyes: Negative for visual disturbance.  Respiratory: Positive for cough. Negative for shortness of breath.   Cardiovascular: Negative for chest pain, palpitations and leg swelling.  Gastrointestinal: Positive for diarrhea. Negative for nausea, vomiting, abdominal pain and constipation.  Genitourinary: Negative for dysuria.  Neurological: Positive for light-headedness. Negative for syncope.       Lightheaded after coughing.       Objective:   Physical Exam  Vitals reviewed. Constitutional: He is oriented to person, place, and time. He appears well-developed. No distress.  HENT:  Head: Normocephalic and atraumatic.  Mouth/Throat: Oropharynx is clear and moist. No oropharyngeal exudate.  Left maxillary sinus TTP; TMs intact, difficult to fully visualize but good cone of light, no effusions apparent; no mastoid tenderness B/L  Eyes: EOM are normal. Pupils are equal, round, and reactive to light.  Neck: Normal range of motion. Neck supple.  Cardiovascular: Normal rate,  regular rhythm and normal heart sounds.  Exam reveals no gallop and no friction rub.   No murmur heard. Pulmonary/Chest: Effort normal. No respiratory distress. He has wheezes. He has no rales.  Few scattered wheezes  Abdominal: Soft. Bowel sounds are normal. He exhibits no distension. There is no tenderness.  Musculoskeletal: Normal range of motion. He exhibits no edema and no tenderness.  Lymphadenopathy:    He has no cervical adenopathy.  Neurological: He is alert and oriented to person, place, and time. No cranial nerve deficit.  Skin: Skin is warm. He is not diaphoretic.  Psychiatric: He has a normal mood and affect. His behavior is normal.          Assessment & Plan:  Please see problem based assessment and plan.

## 2014-04-06 NOTE — Telephone Encounter (Signed)
Pt will be seen in clinic today

## 2014-04-06 NOTE — Assessment & Plan Note (Addendum)
Continues to have congestion, cough, facial pain and now has left sinus tenderness on exam.  Also with left ear and jaw pain.  No purulent drainage, vision abnormalities, fever or necrosis to suggest mucor.  Allergy treatment has not alleviated symptoms.  He has already been prescribed doxycycline last month for the same symptoms. - trial of second line antibiotic in case this is acute bacterial sinusitis - Levaquin 500mg  daily x 7 days; he has been instructed to return to clinic if no improvement after 3-5 days of treatment - continue allergy medications - ordered maxillofacial CT - will require prior authorization - he has already been referred to ENT but waiting on prior-authorization from insurance company - he is advised to go to ED if severe symptoms including fever, vision abnormalities, dyspnea, etc

## 2014-04-06 NOTE — Telephone Encounter (Signed)
Talked with pt - has good and bad days. Denied temp and has nonproductive cough. Appt made for today 04/06/14 2:45PM Dr Redmond Pulling.

## 2014-04-06 NOTE — Telephone Encounter (Signed)
Pt sent e-mail asking for more antibiotics.   He states he is not better,  pressure to face continues.    Cough continue and remains non productive. Nasal drainage is clear.  This has been going on for several weeks.  No energy and he is not able to work.  Pt seen in clinic on 6/12 and 7/6 for chronic sinusitis. Eula Fried & Redmond Pulling )

## 2014-04-13 NOTE — Progress Notes (Signed)
Case discussed with Dr. Wilson at the time of the visit.  We reviewed the resident's history and exam and pertinent patient test results.  I agree with the assessment, diagnosis, and plan of care documented in the resident's note. 

## 2014-04-16 ENCOUNTER — Other Ambulatory Visit: Payer: Self-pay | Admitting: Internal Medicine

## 2014-04-16 MED ORDER — HYDROCODONE-ACETAMINOPHEN 7.5-325 MG PO TABS: 1.0000 | ORAL_TABLET | Freq: Four times a day (QID) | ORAL | Status: DC | PRN

## 2014-04-16 NOTE — Telephone Encounter (Signed)
Pt informed

## 2014-05-08 ENCOUNTER — Other Ambulatory Visit: Payer: Self-pay | Admitting: Internal Medicine

## 2014-05-09 ENCOUNTER — Other Ambulatory Visit: Payer: Self-pay | Admitting: Internal Medicine

## 2014-05-09 ENCOUNTER — Ambulatory Visit (HOSPITAL_COMMUNITY)
Admission: RE | Admit: 2014-05-09 | Discharge: 2014-05-09 | Disposition: A | Discharge status: Home / Self Care | Payer: Medicare HMO | Source: Ambulatory Visit | Attending: Internal Medicine | Admitting: Internal Medicine

## 2014-05-09 DIAGNOSIS — J329 Chronic sinusitis, unspecified: Secondary | ICD-10-CM | POA: Diagnosis not present

## 2014-05-09 NOTE — Telephone Encounter (Signed)
CALLED TO PHARM.

## 2014-05-11 ENCOUNTER — Other Ambulatory Visit: Payer: Self-pay | Admitting: Internal Medicine

## 2014-05-11 MED ORDER — PRAVASTATIN SODIUM 40 MG PO TABS: ORAL_TABLET | ORAL | Status: DC

## 2014-05-11 MED ORDER — GLUCOSE BLOOD VI STRP: ORAL_STRIP | Status: DC

## 2014-05-15 ENCOUNTER — Other Ambulatory Visit: Payer: Self-pay | Admitting: Internal Medicine

## 2014-05-16 ENCOUNTER — Other Ambulatory Visit: Payer: Self-pay | Admitting: *Deleted

## 2014-05-16 MED ORDER — HYDROCODONE-ACETAMINOPHEN 7.5-325 MG PO TABS: 1.0000 | ORAL_TABLET | Freq: Four times a day (QID) | ORAL | Status: DC | PRN

## 2014-05-18 ENCOUNTER — Other Ambulatory Visit: Payer: Self-pay | Admitting: Internal Medicine

## 2014-05-24 ENCOUNTER — Ambulatory Visit: Payer: Commercial Managed Care - HMO

## 2014-05-24 ENCOUNTER — Ambulatory Visit: Payer: Commercial Managed Care - HMO | Admitting: Internal Medicine

## 2014-05-30 ENCOUNTER — Other Ambulatory Visit: Payer: Medicare HMO

## 2014-05-30 ENCOUNTER — Other Ambulatory Visit: Payer: Self-pay | Admitting: Internal Medicine

## 2014-05-30 DIAGNOSIS — B20 Human immunodeficiency virus [HIV] disease: Secondary | ICD-10-CM

## 2014-05-30 DIAGNOSIS — E785 Hyperlipidemia, unspecified: Secondary | ICD-10-CM

## 2014-05-30 LAB — COMPREHENSIVE METABOLIC PANEL
ALBUMIN: 4.2 g/dL (ref 3.5–5.2)
ALK PHOS: 81 U/L (ref 39–117)
ALT: 54 U/L — AB (ref 0–53)
AST: 33 U/L (ref 0–37)
BUN: 14 mg/dL (ref 6–23)
CALCIUM: 9.5 mg/dL (ref 8.4–10.5)
CHLORIDE: 99 meq/L (ref 96–112)
CO2: 26 mEq/L (ref 19–32)
Creat: 0.86 mg/dL (ref 0.50–1.35)
Glucose, Bld: 219 mg/dL — ABNORMAL HIGH (ref 70–99)
POTASSIUM: 3.8 meq/L (ref 3.5–5.3)
SODIUM: 136 meq/L (ref 135–145)
TOTAL PROTEIN: 6.8 g/dL (ref 6.0–8.3)
Total Bilirubin: 0.6 mg/dL (ref 0.2–1.2)

## 2014-05-30 LAB — CBC
HCT: 45.8 % (ref 39.0–52.0)
Hemoglobin: 16.2 g/dL (ref 13.0–17.0)
MCH: 32.9 pg (ref 26.0–34.0)
MCHC: 35.4 g/dL (ref 30.0–36.0)
MCV: 93.1 fL (ref 78.0–100.0)
PLATELETS: 180 10*3/uL (ref 150–400)
RBC: 4.92 MIL/uL (ref 4.22–5.81)
RDW: 13.4 % (ref 11.5–15.5)
WBC: 6.5 10*3/uL (ref 4.0–10.5)

## 2014-05-30 LAB — LIPID PANEL
CHOLESTEROL: 157 mg/dL (ref 0–200)
HDL: 43 mg/dL (ref >39)
LDL Cholesterol: 82 mg/dL (ref 0–99)
Total CHOL/HDL Ratio: 3.7 Ratio
Triglycerides: 159 mg/dL — ABNORMAL HIGH (ref <150)
VLDL: 32 mg/dL (ref 0–40)

## 2014-05-31 LAB — T-HELPER CELL (CD4) - (RCID CLINIC ONLY)
CD4 % Helper T Cell: 41 % (ref 33–55)
CD4 T Cell Abs: 760 /uL (ref 400–2700)

## 2014-06-01 ENCOUNTER — Other Ambulatory Visit: Payer: Self-pay | Admitting: Internal Medicine

## 2014-06-01 LAB — HIV-1 RNA QUANT-NO REFLEX-BLD

## 2014-06-08 ENCOUNTER — Other Ambulatory Visit: Payer: Self-pay | Admitting: Internal Medicine

## 2014-06-13 ENCOUNTER — Encounter: Payer: Self-pay | Admitting: Infectious Diseases

## 2014-06-13 ENCOUNTER — Ambulatory Visit (INDEPENDENT_AMBULATORY_CARE_PROVIDER_SITE_OTHER): Payer: Commercial Managed Care - HMO | Admitting: Infectious Diseases

## 2014-06-13 VITALS — BP 151/85 | HR 61 | Temp 97.6°F | Ht 70.0 in | Wt 197.0 lb

## 2014-06-13 DIAGNOSIS — M069 Rheumatoid arthritis, unspecified: Secondary | ICD-10-CM

## 2014-06-13 DIAGNOSIS — Z0000 Encounter for general adult medical examination without abnormal findings: Secondary | ICD-10-CM

## 2014-06-13 DIAGNOSIS — Z113 Encounter for screening for infections with a predominantly sexual mode of transmission: Secondary | ICD-10-CM

## 2014-06-13 DIAGNOSIS — Z79899 Other long term (current) drug therapy: Secondary | ICD-10-CM

## 2014-06-13 DIAGNOSIS — B20 Human immunodeficiency virus [HIV] disease: Secondary | ICD-10-CM

## 2014-06-13 DIAGNOSIS — E119 Type 2 diabetes mellitus without complications: Secondary | ICD-10-CM

## 2014-06-13 DIAGNOSIS — F17200 Nicotine dependence, unspecified, uncomplicated: Secondary | ICD-10-CM

## 2014-06-13 DIAGNOSIS — R079 Chest pain, unspecified: Secondary | ICD-10-CM

## 2014-06-13 NOTE — Assessment & Plan Note (Signed)
Will f/u with Rheum when his insurance is corrected.

## 2014-06-13 NOTE — Assessment & Plan Note (Signed)
He is doing very well. He is offered/refuses condoms. He is not sexually active. Will see him back in 6 months.

## 2014-06-13 NOTE — Assessment & Plan Note (Signed)
He is being eval by CV.

## 2014-06-13 NOTE — Assessment & Plan Note (Signed)
He will f/u in IM in October. He is aware that this needs improvement.

## 2014-06-13 NOTE — Assessment & Plan Note (Signed)
Had colonoscopy, return in 5 years.

## 2014-06-13 NOTE — Progress Notes (Signed)
   Subjective:    Patient ID: Riley Jones, male    DOB: Dec 16, 1955, 58 y.o.   MRN: [id number]  HPI 58 yo M with hx of HIV+ (~60yrs), DM2 (>70yrs, on insulin) and RA (took MTX without relief, off prednisone). He has been maintained on atripla (has been on multiple previous meds).  Also hx of smoking. Has not been able to cut back. Took Welbutrin briefly.  Only problem has been tachycardia, may get pacer. Has CV appt October.  Continued aches.  "Still depressed". Has taken multiple rx's for this without help. Has issues with family health, work issues. Has lost housing.  HIV 1 RNA Quant (copies/mL)  Date Value  05/30/2014 <20   11/20/2013 <20   05/08/2013 <20      CD4 T Cell Abs (/uL)  Date Value  05/30/2014 760   11/20/2013 840   05/08/2013 730    Has been off insulin. Has had FSG in 200s.  Has seen ophtho this year.   Review of Systems     Objective:   Physical Exam  Constitutional: He appears well-developed and well-nourished.  HENT:  Mouth/Throat: No oropharyngeal exudate.  Eyes: EOM are normal. Pupils are equal, round, and reactive to light.  Neck: Neck supple.  Cardiovascular: Normal rate, regular rhythm and normal heart sounds.   Pulmonary/Chest: Effort normal and breath sounds normal.  Abdominal: Soft. Bowel sounds are normal. He exhibits no distension. There is no tenderness.  Musculoskeletal:  Normal feet. No lesions. Normal light touch, dorsalis pedis pulses.   Lymphadenopathy:    He has no cervical adenopathy.          Assessment & Plan:

## 2014-06-13 NOTE — Assessment & Plan Note (Signed)
Encouraged to quit. 

## 2014-06-14 ENCOUNTER — Other Ambulatory Visit: Payer: Self-pay | Admitting: Licensed Clinical Social Worker

## 2014-06-14 DIAGNOSIS — B20 Human immunodeficiency virus [HIV] disease: Secondary | ICD-10-CM

## 2014-06-14 MED ORDER — EFAVIRENZ-EMTRICITAB-TENOFOVIR 600-200-300 MG PO TABS: ORAL_TABLET | ORAL | Status: DC

## 2014-06-15 ENCOUNTER — Other Ambulatory Visit: Payer: Self-pay | Admitting: Internal Medicine

## 2014-06-20 ENCOUNTER — Other Ambulatory Visit: Payer: Self-pay | Admitting: Internal Medicine

## 2014-06-21 ENCOUNTER — Other Ambulatory Visit: Payer: Self-pay | Admitting: *Deleted

## 2014-06-21 MED ORDER — HYDROCODONE-ACETAMINOPHEN 7.5-325 MG PO TABS: 1.0000 | ORAL_TABLET | Freq: Four times a day (QID) | ORAL | Status: DC | PRN

## 2014-06-21 NOTE — Telephone Encounter (Signed)
Possibly 3?

## 2014-06-22 ENCOUNTER — Other Ambulatory Visit: Payer: Self-pay | Admitting: Internal Medicine

## 2014-06-22 NOTE — Telephone Encounter (Signed)
Rx called in to pharmacy. 

## 2014-07-03 ENCOUNTER — Encounter: Payer: Self-pay | Admitting: Cardiovascular Disease

## 2014-07-03 ENCOUNTER — Encounter: Payer: Self-pay | Admitting: *Deleted

## 2014-07-03 ENCOUNTER — Ambulatory Visit (INDEPENDENT_AMBULATORY_CARE_PROVIDER_SITE_OTHER): Payer: Commercial Managed Care - HMO | Admitting: Cardiovascular Disease

## 2014-07-03 VITALS — BP 140/74 | HR 75 | Ht 70.0 in | Wt 195.0 lb

## 2014-07-03 DIAGNOSIS — I209 Angina pectoris, unspecified: Secondary | ICD-10-CM

## 2014-07-03 DIAGNOSIS — Z720 Tobacco use: Secondary | ICD-10-CM

## 2014-07-03 DIAGNOSIS — Z01818 Encounter for other preprocedural examination: Secondary | ICD-10-CM

## 2014-07-03 DIAGNOSIS — J438 Other emphysema: Secondary | ICD-10-CM

## 2014-07-03 DIAGNOSIS — I10 Essential (primary) hypertension: Secondary | ICD-10-CM

## 2014-07-03 DIAGNOSIS — F17200 Nicotine dependence, unspecified, uncomplicated: Secondary | ICD-10-CM

## 2014-07-03 DIAGNOSIS — E7800 Pure hypercholesterolemia, unspecified: Secondary | ICD-10-CM

## 2014-07-03 DIAGNOSIS — E1021 Type 1 diabetes mellitus with diabetic nephropathy: Secondary | ICD-10-CM

## 2014-07-03 MED ORDER — NITROGLYCERIN 0.4 MG SL SUBL: 0.4000 mg | SUBLINGUAL_TABLET | SUBLINGUAL | Status: DC | PRN

## 2014-07-03 NOTE — Assessment & Plan Note (Signed)
Discussed low carb diet.  Target hemoglobin A1c is 6.5 or less.  Continue current medications. F/U [facility]  Consider ACE post cath

## 2014-07-03 NOTE — Assessment & Plan Note (Signed)
May be responsible for dyspnea Started welbutrin CXR  Check filling pressures at cath

## 2014-07-03 NOTE — Assessment & Plan Note (Signed)
Well controlled.  Continue current medications and low sodium Dash type diet.   Consider changing to ACE/ARB post cath considering DM

## 2014-07-03 NOTE — Assessment & Plan Note (Signed)
Cholesterol is at goal.  Continue current dose of statin and diet Rx.  No myalgias or side effects.  F/U  LFT's in 6 months. Lab Results  Component Value Date   LDLCALC 82 05/30/2014

## 2014-07-03 NOTE — Progress Notes (Signed)
Patient ID: ROC STREETT, male   DOB: [birth year], 58 y.o.   MRN: [id number]    58 yo referred by [facility] for chest pain.  He has HIV since 80's likely from " poor choice in partners"  IDDM for over 10 years Poorly controlled.  A1c over 12 earlier in year and still over 8.  Still smoking Recenlty started on Welbutrin.  SSCP with exertion.  Occasionally at rest but mostly after fixed amount of exertion like walking.  Mild exertional dyspnea. No palpitations or presyncope has no known diagnosis of CAD but has not had stress testing before.  Denies diabetic complications other than neuropathic pain in feet.  Comliant with statin and BP meds.  Not on ACE for renal protection.  Some component of COPD from smoking No fever cough or sputum No opportunistic infections  Last CD 4 count 760     ROS: Denies fever, malais, weight loss, blurry vision, decreased visual acuity, cough, sputum, SOB, hemoptysis, pleuritic pain, palpitaitons, heartburn, abdominal pain, melena, lower extremity edema, claudication, or rash.  All other systems reviewed and negative   General: Affect appropriate Healthy:  appears stated age 18: normal Neck supple with no adenopathy JVP normal no bruits no thyromegaly Lungs clear with no wheezing and good diaphragmatic motion Heart:  S1/S2 no murmur,rub, gallop or click PMI normal Abdomen: benighn, BS positve, no tenderness, no AAA no bruit.  No HSM or HJR Distal pulses intact with no bruits No edema Neuro non-focal Skin warm and dry No muscular weakness  Medications Current Outpatient Prescriptions  Medication Sig Dispense Refill  . albuterol (VENTOLIN HFA) 108 (90 BASE) MCG/ACT inhaler Inhale 1-2 puffs into the lungs every 4 (four) hours as needed. for shortness of breath. Or may use every 6 hours as needed.       . ALPRAZolam (XANAX) 0.5 MG tablet TAKE ONE TABLET BY MOUTH AT BEDTIME AS NEEDED  30 tablet  0  . amLODipine (NORVASC) 10 MG tablet TAKE ONE TABLET  BY MOUTH ONCE DAILY  31 tablet  3  . aspirin 81 MG EC tablet Take 81 mg by mouth daily.        Marland Kitchen buPROPion (WELLBUTRIN SR) 150 MG 12 hr tablet Take 1 tablet (150 mg total) by mouth 2 (two) times daily.  60 tablet  2  . clotrimazole-betamethasone (LOTRISONE) cream Apply to affected area 2 times daily  15 g  1  . diphenhydrAMINE (BENADRYL) 50 MG capsule TAKE ONE CAPSULE BY MOUTH AT BEDTIME AS NEEDED FOR  ITCHING  30 capsule  0  . efavirenz-emtricitabine-tenofovir (ATRIPLA) 600-200-300 MG per tablet TAKE 1 TABLET BY MOUTH DAILY  30 tablet  0  . fluticasone (FLONASE) 50 MCG/ACT nasal spray       . folic acid (FOLVITE) 1 MG tablet Take 1 tablet (1 mg total) by mouth daily.  30 tablet  3  . glucose blood (FREESTYLE LITE) test strip USE ONE STRIP TO CHECK GLUCOSE TWICE DAILY  100 each  3  . hydrochlorothiazide (HYDRODIURIL) 25 MG tablet TAKE ONE TABLET BY MOUTH ONCE DAILY  30 tablet  3  . HYDROcodone-acetaminophen (NORCO) 7.5-325 MG per tablet Take 1 tablet by mouth every 6 (six) hours as needed for moderate pain.  60 tablet  0  . insulin NPH Human (HUMULIN N) 100 UNIT/ML injection Inject 7 Units into the skin 2 (two) times daily before a meal.  10 mL  3  . Insulin Pen Needle 31G X 5 MM MISC Use  it for insulin injection  100 each  11  . Insulin Syringe-Needle U-100 (B-D INS SYRINGE 0.5CC/30GX1/2") 30G X 1/2" 0.5 ML MISC USE 1 SYRINGE IN AM AND I IN THE EVENING. DX CODE: 250.00.  100 each  2  . loratadine (CLARITIN) 10 MG tablet Take 1 tablet (10 mg total) by mouth daily.  30 tablet  1  . metFORMIN (GLUCOPHAGE) 1000 MG tablet TAKE ONE TABLET BY MOUTH TWICE DAILY. TAKE WITH A MEAL.  180 tablet  0  . Multiple Vitamin (MULTIVITAMIN WITH MINERALS) TABS Take 1 tablet by mouth daily.      Marland Kitchen omeprazole (PRILOSEC) 20 MG capsule Take 1 capsule (20 mg total) by mouth daily.  30 capsule  prn  . pravastatin (PRAVACHOL) 40 MG tablet TAKE ONE TABLET BY MOUTH AT BEDTIME  90 tablet  0  . terazosin (HYTRIN) 2 MG capsule  TAKE TWO CAPSULES BY MOUTH AT BEDTIME  60 capsule  0   No current facility-administered medications for this visit.    Allergies Penicillins; Chantix; and Zoloft  Family History: Family History  Problem Relation Age of Onset  . Osteoporosis Mother   . Heart disease Father     Social History: History   Social History  . Marital Status: Single    Spouse Name: N/A    Number of Children: N/A  . Years of Education: 13   Occupational History  . Retired Administrator    Social History Main Topics  . Smoking status: Current Every Day Smoker -- 1.00 packs/day for 42 years    Types: Cigarettes  . Smokeless tobacco: Never Used     Comment: Smoking a little less. 1 ppd. will start welbutrin  . Alcohol Use: 0.6 oz/week    1 Glasses of wine per week     Comment: occasional wine  . Drug Use: No  . Sexual Activity: No     Comment: given condoms   Other Topics Concern  . Not on file   Social History Narrative   [employer] approved beginning 12/11/2009 - 12/27/2010   [employer] benefits approved; patient eligible for 100% discount for out patient labs and office visits. Patient eligible for 70% discount for other services per [employer] 09/08/2010      Patient is on disability, and walks 2-3 times per week.     Electrocardiogram:  SR rate 75  Lateral T wave changes   Assessment and Plan

## 2014-07-03 NOTE — Assessment & Plan Note (Signed)
Counseled for less than 10 minutes on cessation Link to COPD and vascular disease discussed Continue welbutrin f/u primary

## 2014-07-03 NOTE — Patient Instructions (Addendum)
Your physician has requested that you have a cardiac catheterization. Cardiac catheterization is used to diagnose and/or treat various heart conditions. Doctors may recommend this procedure for a number of different reasons. The most common reason is to evaluate chest pain. Chest pain can be a symptom of coronary artery disease (CAD), and cardiac catheterization can show whether plaque is narrowing or blocking your heart's arteries. This procedure is also used to evaluate the valves, as well as measure the blood flow and oxygen levels in different parts of your heart. For further information please visit [email or web address]. Please follow instruction sheet, as given. Your physician recommends that you continue on your current medications as directed. Please refer to the Current Medication list given to you today.  Your physician recommends that you return for lab work in: BMET  CBC  INR  Nitroglycerin sublingual tablets What is this medicine? NITROGLYCERIN (nye troe GLI ser in) is a type of vasodilator. It relaxes blood vessels, increasing the blood and oxygen supply to your heart. This medicine is used to relieve chest pain caused by angina. It is also used to prevent chest pain before activities like climbing stairs, going outdoors in cold weather, or sexual activity. This medicine may be used for other purposes; ask your health care provider or pharmacist if you have questions. COMMON BRAND NAME(S): Nitroquick, Nitrostat, Nitrotab What should I tell my health care provider before I take this medicine? They need to know if you have any of these conditions: -anemia -head injury, recent stroke, or bleeding in the brain -liver disease -previous heart attack -an unusual or allergic reaction to nitroglycerin, other medicines, foods, dyes, or preservatives -pregnant or trying to get pregnant -breast-feeding How should I use this medicine? Take this medicine by mouth as needed. At the first sign of an  angina attack (chest pain or tightness) place one tablet under your tongue. You can also take this medicine 5 to 10 minutes before an event likely to produce chest pain. Follow the directions on the prescription label. Let the tablet dissolve under the tongue. Do not swallow whole. Replace the dose if you accidentally swallow it. It will help if your mouth is not dry. Saliva around the tablet will help it to dissolve more quickly. Do not eat or drink, smoke or chew tobacco while a tablet is dissolving. If you are not better within 5 minutes after taking ONE dose of nitroglycerin, call 9-1-1 immediately to seek emergency medical care. Do not take more than 3 nitroglycerin tablets over 15 minutes. If you take this medicine often to relieve symptoms of angina, your doctor or health care professional may provide you with different instructions to manage your symptoms. If symptoms do not go away after following these instructions, it is important to call 9-1-1 immediately. Do not take more than 3 nitroglycerin tablets over 15 minutes. Talk to your pediatrician regarding the use of this medicine in children. Special care may be needed. Overdosage: If you think you have taken too much of this medicine contact a poison control center or emergency room at once. NOTE: This medicine is only for you. Do not share this medicine with others. What if I miss a dose? This does not apply. This medicine is only used as needed. What may interact with this medicine? Do not take this medicine with any of the following medications: -certain migraine medicines like ergotamine and dihydroergotamine (DHE) -medicines used to treat erectile dysfunction like sildenafil, tadalafil, and vardenafil -riociguat This medicine may also interact  with the following medications: -alteplase -aspirin -heparin -medicines for high blood pressure -medicines for mental depression -other medicines used to treat angina -phenothiazines like  chlorpromazine, mesoridazine, prochlorperazine, thioridazine This list may not describe all possible interactions. Give your health care provider a list of all the medicines, herbs, non-prescription drugs, or dietary supplements you use. Also tell them if you smoke, drink alcohol, or use illegal drugs. Some items may interact with your medicine. What should I watch for while using this medicine? Tell your doctor or health care professional if you feel your medicine is no longer working. Keep this medicine with you at all times. Sit or lie down when you take your medicine to prevent falling if you feel dizzy or faint after using it. Try to remain calm. This will help you to feel better faster. If you feel dizzy, take several deep breaths and lie down with your feet propped up, or bend forward with your head resting between your knees. You may get drowsy or dizzy. Do not drive, use machinery, or do anything that needs mental alertness until you know how this drug affects you. Do not stand or sit up quickly, especially if you are an older patient. This reduces the risk of dizzy or fainting spells. Alcohol can make you more drowsy and dizzy. Avoid alcoholic drinks. Do not treat yourself for coughs, colds, or pain while you are taking this medicine without asking your doctor or health care professional for advice. Some ingredients may increase your blood pressure. What side effects may I notice from receiving this medicine? Side effects that you should report to your doctor or health care professional as soon as possible: -blurred vision -dry mouth -skin rash -sweating -the feeling of extreme pressure in the head -unusually weak or tired Side effects that usually do not require medical attention (report to your doctor or health care professional if they continue or are bothersome): -flushing of the face or neck -headache -irregular heartbeat, palpitations -nausea, vomiting This list may not describe  all possible side effects. Call your doctor for medical advice about side effects. You may report side effects to FDA at 1-800-FDA-1088. Where should I keep my medicine? Keep out of the reach of children. Store at room temperature between 20 and 25 degrees C (68 and 77 degrees F). Store in [employer]. Protect from light and moisture. Keep tightly closed. Throw away any unused medicine after the expiration date. NOTE: This sheet is a summary. It may not cover all possible information. If you have questions about this medicine, talk to your doctor, pharmacist, or health care provider.  2015, Elsevier/Gold Standard. (2013-07-13 17:57:36)

## 2014-07-03 NOTE — Assessment & Plan Note (Signed)
IDDM poorly controlled, Smoker with exertional chest pain and abnormal ECG lateral T wave changes. Discussed options Favor diagnostic cath Risks including stroke discussed Willing to proceed Orders written lab called Labs next Monday

## 2014-07-05 ENCOUNTER — Ambulatory Visit (INDEPENDENT_AMBULATORY_CARE_PROVIDER_SITE_OTHER): Payer: Commercial Managed Care - HMO | Admitting: Internal Medicine

## 2014-07-05 ENCOUNTER — Encounter: Payer: Self-pay | Admitting: Internal Medicine

## 2014-07-05 VITALS — BP 145/76 | HR 63 | Temp 97.5°F | Ht 70.0 in | Wt 197.6 lb

## 2014-07-05 DIAGNOSIS — M069 Rheumatoid arthritis, unspecified: Secondary | ICD-10-CM

## 2014-07-05 DIAGNOSIS — I209 Angina pectoris, unspecified: Secondary | ICD-10-CM | POA: Diagnosis not present

## 2014-07-05 DIAGNOSIS — I10 Essential (primary) hypertension: Secondary | ICD-10-CM | POA: Diagnosis not present

## 2014-07-05 DIAGNOSIS — E119 Type 2 diabetes mellitus without complications: Secondary | ICD-10-CM | POA: Diagnosis not present

## 2014-07-05 DIAGNOSIS — F17200 Nicotine dependence, unspecified, uncomplicated: Secondary | ICD-10-CM

## 2014-07-05 DIAGNOSIS — Z720 Tobacco use: Secondary | ICD-10-CM | POA: Insufficient documentation

## 2014-07-05 DIAGNOSIS — Z0000 Encounter for general adult medical examination without abnormal findings: Secondary | ICD-10-CM

## 2014-07-05 LAB — GLUCOSE, CAPILLARY: Glucose-Capillary: 241 mg/dL — ABNORMAL HIGH (ref 70–99)

## 2014-07-05 LAB — POCT GLYCOSYLATED HEMOGLOBIN (HGB A1C): Hemoglobin A1C: 10.1

## 2014-07-05 MED ORDER — INSULIN NPH (HUMAN) (ISOPHANE) 100 UNIT/ML ~~LOC~~ SUSP: 12.0000 [IU] | Freq: Two times a day (BID) | SUBCUTANEOUS | Status: DC

## 2014-07-05 MED ORDER — ZOSTER VACCINE LIVE 19400 UNT/0.65ML ~~LOC~~ SOLR: 0.6500 mL | Freq: Once | SUBCUTANEOUS | Status: DC

## 2014-07-05 NOTE — Assessment & Plan Note (Addendum)
-   Pt with intermittent chest pain still - Pt is scheduled to have a cath on 10/20 with Dr. Johnsie Cancel - Will follow up on the results - Consider ACE-I/ARB post cath

## 2014-07-05 NOTE — Assessment & Plan Note (Signed)
-   Pt already received a flu vaccine at [facility] - Prescription given for zoster vaccine

## 2014-07-05 NOTE — Progress Notes (Signed)
   Subjective:    Patient ID: Riley Jones, male    DOB: December 15, 1955, 58 y.o.   MRN: [id number]  HPI 57 y/o male with PMH of HIV, DM type 2, RA, HTN, HLD, anxiety who presents for routine follow up.  Pt states that he has not been following his diabetic diet and has been eating a lot of sugar with his oatmeal and he occasionally eats sugary cereals as well.  He is on his feet all day but does not exercise otherwise He has been trying to cut back on his smoking but still smokes 1 PPD. He was prescribed bupropion for smoking cessation but has not started it yet. He saw his cardiologist recently for recurrent chest pain- mid sternal, non radiating, pressure like. He was recommended to go for cath on 10/20. He does complain of pain from RA but did not follow up with his rheumatologist and has stopped taking the methotrexate   Review of Systems  Constitutional: Negative.   HENT: Negative.   Eyes: Negative.   Respiratory: Negative.   Cardiovascular: Positive for chest pain.  Gastrointestinal: Negative.   Endocrine: Negative.   Genitourinary: Negative.   Musculoskeletal: Positive for arthralgias.  Skin: Negative.   Neurological: Negative.   Psychiatric/Behavioral: Negative.        Objective:   Physical Exam  Constitutional: He is oriented to person, place, and time. He appears well-developed and well-nourished.  HENT:  Head: Normocephalic and atraumatic.  Eyes: Conjunctivae are normal.  Neck: Normal range of motion.  Cardiovascular: Normal rate and regular rhythm.   Pulmonary/Chest: Effort normal and breath sounds normal. He has no wheezes. He has no rales.  Abdominal: Soft. Bowel sounds are normal. There is no tenderness. There is no rebound.  Musculoskeletal: Normal range of motion. He exhibits no edema.  Neurological: He is alert and oriented to person, place, and time.  Skin: Skin is warm and dry.  Psychiatric: He has a normal mood and affect. His behavior is normal.           Assessment & Plan:  Please see problem based charting for assessment and plan:

## 2014-07-05 NOTE — Assessment & Plan Note (Signed)
Lab Results  Component Value Date   HGBA1C 10.1 07/05/2014   HGBA1C 8.2 04/02/2014   HGBA1C 9.2 12/28/2013     Assessment: Diabetes control:  poorly controlled Progress toward A1C goal:   deteriorated Comments: Pt is non compliant with diet  Plan: Medications:  continue current medications, Will increase humulin N to 12 units twice a day Home glucose monitoring: Frequency:   Timing:   Instruction/counseling given: reminded to bring blood glucose meter & log to each visit Educational resources provided: brochure Self management tools provided:   Other plans: Pt counseled about importance of diet and exercise

## 2014-07-05 NOTE — Patient Instructions (Addendum)
-   It was a pleasure meeting you today - Thank you for bringing your medications - Your A1C is 10.1 today. Please watch your diet and take your medications - We will follow up in 3 months - I will give you a prescription for the zoster vaccine. Please get the vaccine at your pharmacy - I will give you a prescription for diabetic shoes - Increase your insulin to 12 units twice a day from 10 units twice a day

## 2014-07-05 NOTE — Assessment & Plan Note (Signed)
-   Pt has stopped taking his methotrexate and has not followed up with his rheumatologist - Still has pain in multiple joints - Will need referral back to rheumatology but pt wants to defer till after his cardiac cath - c/w pain control prn for now

## 2014-07-05 NOTE — Assessment & Plan Note (Signed)
-   Pt started on bupropion but has not taken it yet - Advised to set a quit date and start the med 1 week prior - Pt wants to defer till after his cardiac cath

## 2014-07-05 NOTE — Assessment & Plan Note (Signed)
BP Readings from Last 3 Encounters:  07/05/14 145/76  07/03/14 140/74  06/13/14 151/85    Lab Results  Component Value Date   NA 136 05/30/2014   K 3.8 05/30/2014   CREATININE 0.86 05/30/2014    Assessment: Blood pressure control:  fair control Progress toward BP goal:   near goal Comments: Will consider starting ACE- I/ ARB after cath  Plan: Medications:  continue current medications Educational resources provided: brochure Self management tools provided:   Other plans: none

## 2014-07-09 ENCOUNTER — Encounter (HOSPITAL_COMMUNITY): Payer: Self-pay | Admitting: Pharmacy Technician

## 2014-07-10 ENCOUNTER — Other Ambulatory Visit (INDEPENDENT_AMBULATORY_CARE_PROVIDER_SITE_OTHER): Payer: Commercial Managed Care - HMO | Admitting: *Deleted

## 2014-07-10 DIAGNOSIS — Z01818 Encounter for other preprocedural examination: Secondary | ICD-10-CM

## 2014-07-10 LAB — CBC WITH DIFFERENTIAL/PLATELET
BASOS ABS: 0.1 10*3/uL (ref 0.0–0.1)
Basophils Relative: 0.6 % (ref 0.0–3.0)
Eosinophils Absolute: 0.3 10*3/uL (ref 0.0–0.7)
Eosinophils Relative: 3.1 % (ref 0.0–5.0)
HEMATOCRIT: 48.5 % (ref 39.0–52.0)
HEMOGLOBIN: 16.2 g/dL (ref 13.0–17.0)
LYMPHS ABS: 2.3 10*3/uL (ref 0.7–4.0)
Lymphocytes Relative: 24.8 % (ref 12.0–46.0)
MCHC: 33.4 g/dL (ref 30.0–36.0)
MCV: 97.3 fl (ref 78.0–100.0)
MONO ABS: 0.7 10*3/uL (ref 0.1–1.0)
MONOS PCT: 7.4 % (ref 3.0–12.0)
NEUTROS ABS: 5.9 10*3/uL (ref 1.4–7.7)
Neutrophils Relative %: 64.1 % (ref 43.0–77.0)
Platelets: 192 10*3/uL (ref 150.0–400.0)
RBC: 4.99 Mil/uL (ref 4.22–5.81)
RDW: 12.6 % (ref 11.5–15.5)
WBC: 9.3 10*3/uL (ref 4.0–10.5)

## 2014-07-10 LAB — BASIC METABOLIC PANEL
BUN: 14 mg/dL (ref 6–23)
CO2: 29 meq/L (ref 19–32)
Calcium: 9.5 mg/dL (ref 8.4–10.5)
Chloride: 98 mEq/L (ref 96–112)
Creatinine, Ser: 0.9 mg/dL (ref 0.4–1.5)
GFR: 93.86 mL/min (ref >60.00)
GLUCOSE: 224 mg/dL — AB (ref 70–99)
POTASSIUM: 3.7 meq/L (ref 3.5–5.1)
Sodium: 133 mEq/L — ABNORMAL LOW (ref 135–145)

## 2014-07-10 LAB — PROTIME-INR
INR: 0.9 ratio (ref 0.8–1.0)
Prothrombin Time: 10.3 s (ref 9.6–13.1)

## 2014-07-13 ENCOUNTER — Telehealth: Payer: Self-pay | Admitting: Cardiovascular Disease

## 2014-07-13 ENCOUNTER — Other Ambulatory Visit: Payer: Self-pay | Admitting: Internal Medicine

## 2014-07-13 NOTE — Telephone Encounter (Signed)
LMTCB ./CY 

## 2014-07-13 NOTE — Telephone Encounter (Signed)
PT AWARE OF LAB RESULTS./CY 

## 2014-07-13 NOTE — Telephone Encounter (Signed)
New message  ° ° °Returning call back to nurse.  °

## 2014-07-17 ENCOUNTER — Encounter (HOSPITAL_COMMUNITY)
Admission: RE | Disposition: A | Discharge status: Home / Self Care | Payer: Commercial Managed Care - HMO | Source: Ambulatory Visit | Attending: Cardiovascular Disease

## 2014-07-17 ENCOUNTER — Encounter (HOSPITAL_COMMUNITY): Payer: Self-pay | Admitting: General Practice

## 2014-07-17 ENCOUNTER — Other Ambulatory Visit: Payer: Self-pay | Admitting: Cardiovascular Disease

## 2014-07-17 ENCOUNTER — Ambulatory Visit (HOSPITAL_COMMUNITY)
Admission: RE | Admit: 2014-07-17 | Discharge: 2014-07-18 | Disposition: A | Discharge status: Home / Self Care | Payer: Medicare HMO | Source: Ambulatory Visit | Attending: Cardiovascular Disease | Admitting: Cardiovascular Disease

## 2014-07-17 DIAGNOSIS — Z79899 Other long term (current) drug therapy: Secondary | ICD-10-CM | POA: Insufficient documentation

## 2014-07-17 DIAGNOSIS — Z794 Long term (current) use of insulin: Secondary | ICD-10-CM | POA: Insufficient documentation

## 2014-07-17 DIAGNOSIS — J449 Chronic obstructive pulmonary disease, unspecified: Secondary | ICD-10-CM | POA: Insufficient documentation

## 2014-07-17 DIAGNOSIS — I209 Angina pectoris, unspecified: Secondary | ICD-10-CM

## 2014-07-17 DIAGNOSIS — B20 Human immunodeficiency virus [HIV] disease: Secondary | ICD-10-CM | POA: Diagnosis not present

## 2014-07-17 DIAGNOSIS — J410 Simple chronic bronchitis: Secondary | ICD-10-CM

## 2014-07-17 DIAGNOSIS — Z7982 Long term (current) use of aspirin: Secondary | ICD-10-CM | POA: Diagnosis not present

## 2014-07-17 DIAGNOSIS — I200 Unstable angina: Secondary | ICD-10-CM

## 2014-07-17 DIAGNOSIS — E785 Hyperlipidemia, unspecified: Secondary | ICD-10-CM | POA: Diagnosis not present

## 2014-07-17 DIAGNOSIS — I25118 Atherosclerotic heart disease of native coronary artery with other forms of angina pectoris: Secondary | ICD-10-CM

## 2014-07-17 DIAGNOSIS — I10 Essential (primary) hypertension: Secondary | ICD-10-CM | POA: Diagnosis not present

## 2014-07-17 DIAGNOSIS — F17210 Nicotine dependence, cigarettes, uncomplicated: Secondary | ICD-10-CM | POA: Insufficient documentation

## 2014-07-17 DIAGNOSIS — Z955 Presence of coronary angioplasty implant and graft: Secondary | ICD-10-CM

## 2014-07-17 DIAGNOSIS — I25110 Atherosclerotic heart disease of native coronary artery with unstable angina pectoris: Secondary | ICD-10-CM | POA: Diagnosis not present

## 2014-07-17 DIAGNOSIS — F17200 Nicotine dependence, unspecified, uncomplicated: Secondary | ICD-10-CM | POA: Diagnosis present

## 2014-07-17 DIAGNOSIS — Z720 Tobacco use: Secondary | ICD-10-CM | POA: Diagnosis present

## 2014-07-17 HISTORY — PX: PERCUTANEOUS STENT INTERVENTION: SHX5500

## 2014-07-17 HISTORY — DX: Gastro-esophageal reflux disease without esophagitis: K21.9

## 2014-07-17 HISTORY — DX: Unspecified osteoarthritis, unspecified site: M19.90

## 2014-07-17 HISTORY — DX: Unspecified asthma, uncomplicated: J45.909

## 2014-07-17 HISTORY — DX: Calculus of kidney: N20.0

## 2014-07-17 HISTORY — DX: Sleep apnea, unspecified: G47.30

## 2014-07-17 HISTORY — PX: LEFT HEART CATHETERIZATION WITH CORONARY ANGIOGRAM: SHX5451

## 2014-07-17 HISTORY — PX: CORONARY ANGIOPLASTY WITH STENT PLACEMENT: SHX49

## 2014-07-17 HISTORY — DX: Anxiety disorder, unspecified: F41.9

## 2014-07-17 HISTORY — DX: Atherosclerotic heart disease of native coronary artery without angina pectoris: I25.10

## 2014-07-17 HISTORY — DX: Personal history of other diseases of the digestive system: Z87.19

## 2014-07-17 HISTORY — DX: Headache, unspecified: R51.9

## 2014-07-17 HISTORY — DX: Personal history of other diseases of the musculoskeletal system and connective tissue: Z87.39

## 2014-07-17 HISTORY — DX: Headache: R51

## 2014-07-17 HISTORY — DX: Pneumonia, unspecified organism: J18.9

## 2014-07-17 HISTORY — DX: Chronic obstructive pulmonary disease, unspecified: J44.9

## 2014-07-17 LAB — GLUCOSE, CAPILLARY
Glucose-Capillary: 238 mg/dL — ABNORMAL HIGH (ref 70–99)
Glucose-Capillary: 177 mg/dL — ABNORMAL HIGH (ref 70–99)
Glucose-Capillary: 274 mg/dL — ABNORMAL HIGH (ref 70–99)
Glucose-Capillary: 262 mg/dL — ABNORMAL HIGH (ref 70–99)

## 2014-07-17 LAB — POCT ACTIVATED CLOTTING TIME: ACTIVATED CLOTTING TIME: 422 s

## 2014-07-17 SURGERY — LEFT HEART CATHETERIZATION WITH CORONARY ANGIOGRAM
Anesthesia: LOCAL

## 2014-07-17 MED ORDER — FOLIC ACID 1 MG PO TABS
1.0000 mg | ORAL_TABLET | Freq: Every day | ORAL | Status: DC
  Administered 2014-07-18: 11:00:00 1 mg via ORAL
  Filled 2014-07-17: qty 1

## 2014-07-17 MED ORDER — LORATADINE 10 MG PO TABS
10.0000 mg | ORAL_TABLET | Freq: Every day | ORAL | Status: DC
  Administered 2014-07-18: 11:00:00 10 mg via ORAL
  Filled 2014-07-17 (×2): qty 1

## 2014-07-17 MED ORDER — ASPIRIN EC 81 MG PO TBEC
81.0000 mg | DELAYED_RELEASE_TABLET | Freq: Every day | ORAL | Status: DC
  Administered 2014-07-18: 11:00:00 81 mg via ORAL
  Filled 2014-07-17: qty 1

## 2014-07-17 MED ORDER — HYDROCHLOROTHIAZIDE 25 MG PO TABS
25.0000 mg | ORAL_TABLET | Freq: Every day | ORAL | Status: DC
  Administered 2014-07-18: 25 mg via ORAL
  Filled 2014-07-17: qty 1

## 2014-07-17 MED ORDER — IBUPROFEN 400 MG PO TABS
400.0000 mg | ORAL_TABLET | Freq: Four times a day (QID) | ORAL | Status: DC | PRN
  Filled 2014-07-17: qty 1

## 2014-07-17 MED ORDER — ACETAMINOPHEN 325 MG PO TABS
650.0000 mg | ORAL_TABLET | ORAL | Status: DC | PRN
  Administered 2014-07-17: 650 mg via ORAL
  Filled 2014-07-17: qty 2

## 2014-07-17 MED ORDER — INSULIN ASPART 100 UNIT/ML ~~LOC~~ SOLN
0.0000 [IU] | Freq: Three times a day (TID) | SUBCUTANEOUS | Status: DC
  Administered 2014-07-17: 19:00:00 5 [IU] via SUBCUTANEOUS
  Administered 2014-07-18: 3 [IU] via SUBCUTANEOUS

## 2014-07-17 MED ORDER — PRASUGREL HCL 10 MG PO TABS
ORAL_TABLET | ORAL | Status: AC
  Filled 2014-07-17: qty 1

## 2014-07-17 MED ORDER — ALPRAZOLAM 0.5 MG PO TABS: 0.5000 mg | ORAL_TABLET | Freq: Every evening | ORAL | Status: DC | PRN

## 2014-07-17 MED ORDER — ADULT MULTIVITAMIN W/MINERALS CH
1.0000 | ORAL_TABLET | Freq: Every day | ORAL | Status: DC
  Administered 2014-07-18: 11:00:00 1 via ORAL
  Filled 2014-07-17: qty 1

## 2014-07-17 MED ORDER — MIDAZOLAM HCL 2 MG/2ML IJ SOLN
INTRAMUSCULAR | Status: AC
Start: 2014-07-17 — End: 2014-07-17
  Filled 2014-07-17: qty 2

## 2014-07-17 MED ORDER — NITROGLYCERIN 0.4 MG SL SUBL: 0.4000 mg | SUBLINGUAL_TABLET | SUBLINGUAL | Status: DC | PRN

## 2014-07-17 MED ORDER — BIVALIRUDIN 250 MG IV SOLR
INTRAVENOUS | Status: AC
  Filled 2014-07-17: qty 250

## 2014-07-17 MED ORDER — PRAVASTATIN SODIUM 40 MG PO TABS
40.0000 mg | ORAL_TABLET | Freq: Every day | ORAL | Status: DC
  Administered 2014-07-17 – 2014-07-18 (×2): 40 mg via ORAL
  Filled 2014-07-17 (×2): qty 1

## 2014-07-17 MED ORDER — FENTANYL CITRATE 0.05 MG/ML IJ SOLN
INTRAMUSCULAR | Status: AC
  Filled 2014-07-17: qty 2

## 2014-07-17 MED ORDER — PANTOPRAZOLE SODIUM 40 MG PO TBEC
40.0000 mg | DELAYED_RELEASE_TABLET | Freq: Every day | ORAL | Status: DC
  Administered 2014-07-18: 11:00:00 40 mg via ORAL
  Filled 2014-07-17: qty 1

## 2014-07-17 MED ORDER — TERAZOSIN HCL 2 MG PO CAPS
2.0000 mg | ORAL_CAPSULE | Freq: Every day | ORAL | Status: DC
  Administered 2014-07-17: 22:00:00 2 mg via ORAL
  Filled 2014-07-17 (×2): qty 1

## 2014-07-17 MED ORDER — INSULIN NPH (HUMAN) (ISOPHANE) 100 UNIT/ML ~~LOC~~ SUSP
12.0000 [IU] | Freq: Two times a day (BID) | SUBCUTANEOUS | Status: DC
  Administered 2014-07-17 – 2014-07-18 (×2): 12 [IU] via SUBCUTANEOUS
  Filled 2014-07-17: qty 10

## 2014-07-17 MED ORDER — PRASUGREL HCL 10 MG PO TABS
10.0000 mg | ORAL_TABLET | Freq: Every day | ORAL | Status: DC
  Administered 2014-07-18: 10 mg via ORAL
  Filled 2014-07-17 (×2): qty 1

## 2014-07-17 MED ORDER — SODIUM CHLORIDE 0.9 % IJ SOLN: 3.0000 mL | Freq: Two times a day (BID) | INTRAMUSCULAR | Status: DC

## 2014-07-17 MED ORDER — EFAVIRENZ-EMTRICITAB-TENOFOVIR 600-200-300 MG PO TABS
1.0000 | ORAL_TABLET | Freq: Every day | ORAL | Status: DC
  Administered 2014-07-17 – 2014-07-18 (×2): 1 via ORAL
  Filled 2014-07-17 (×3): qty 1

## 2014-07-17 MED ORDER — AMLODIPINE BESYLATE 10 MG PO TABS
10.0000 mg | ORAL_TABLET | Freq: Every day | ORAL | Status: DC
  Administered 2014-07-18: 11:00:00 10 mg via ORAL
  Filled 2014-07-17: qty 1

## 2014-07-17 MED ORDER — VERAPAMIL HCL 2.5 MG/ML IV SOLN
INTRAVENOUS | Status: AC
  Filled 2014-07-17: qty 2

## 2014-07-17 MED ORDER — ALBUTEROL SULFATE (2.5 MG/3ML) 0.083% IN NEBU: 3.0000 mL | INHALATION_SOLUTION | RESPIRATORY_TRACT | Status: DC | PRN

## 2014-07-17 MED ORDER — ONDANSETRON HCL 4 MG/2ML IJ SOLN: 4.0000 mg | Freq: Four times a day (QID) | INTRAMUSCULAR | Status: DC | PRN

## 2014-07-17 MED ORDER — MIDAZOLAM HCL 2 MG/2ML IJ SOLN
INTRAMUSCULAR | Status: AC
  Filled 2014-07-17: qty 2

## 2014-07-17 MED ORDER — ASPIRIN 81 MG PO CHEW
CHEWABLE_TABLET | ORAL | Status: AC
  Filled 2014-07-17: qty 1

## 2014-07-17 MED ORDER — NITROGLYCERIN 1 MG/10 ML FOR IR/CATH LAB
INTRA_ARTERIAL | Status: AC
  Filled 2014-07-17: qty 10

## 2014-07-17 MED ORDER — SODIUM CHLORIDE 0.9 % IV SOLN
1.0000 mL/kg/h | INTRAVENOUS | Status: AC
  Administered 2014-07-17: 1 mL/kg/h via INTRAVENOUS

## 2014-07-17 MED ORDER — HEPARIN (PORCINE) IN NACL 2-0.9 UNIT/ML-% IJ SOLN
INTRAMUSCULAR | Status: AC
  Filled 2014-07-17: qty 1000

## 2014-07-17 MED ORDER — ASPIRIN 81 MG PO CHEW
81.0000 mg | CHEWABLE_TABLET | ORAL | Status: AC
  Administered 2014-07-17: 81 mg via ORAL

## 2014-07-17 MED ORDER — HYDROCODONE-ACETAMINOPHEN 7.5-325 MG PO TABS: 1.0000 | ORAL_TABLET | Freq: Four times a day (QID) | ORAL | Status: DC | PRN

## 2014-07-17 MED ORDER — SODIUM CHLORIDE 0.9 % IJ SOLN: 3.0000 mL | INTRAMUSCULAR | Status: DC | PRN

## 2014-07-17 MED ORDER — LIDOCAINE HCL (PF) 1 % IJ SOLN
INTRAMUSCULAR | Status: AC
  Filled 2014-07-17: qty 30

## 2014-07-17 MED ORDER — SODIUM CHLORIDE 0.9 % IV SOLN
250.0000 mL | INTRAVENOUS | Status: DC | PRN
  Administered 2014-07-17: 10:00:00 via INTRAVENOUS

## 2014-07-17 NOTE — CV Procedure (Signed)
    Cardiac Catheterization Procedure Note  Name: Riley Jones MRN: [id number] DOB: 03/27/1956  Procedure: Left Heart Cath, Selective Coronary Angiography, LV angiography  Indication:  Angina   Procedural Details: The right wrist was prepped, draped, and anesthetized with 1% lidocaine. Using the modified Seldinger technique, a 5/6 French Slender sheath was introduced into the right radial artery. 3 mg of verapamil was administered through the sheath, weight-based unfractionated heparin was administered intravenously. Standard Judkins catheters were used for selective coronary angiography and left ventriculography. Catheter exchanges were performed over an exchange length guidewire. There were no immediate procedural complications. A TR band was used for radial hemostasis at the completion of the procedure.  The patient was transferred to the post catheterization recovery area for further monitoring.  Procedural Findings: Hemodynamics: AO  118 70  LV  123 12  Coronary angiography: Coronary dominance: right  Left mainstem:  Normal  Left anterior descending (LAD):  40-50% proximal   80% mid at D2 take off shelf like lesion   D1: normal  D2: normal  D3: normal  Left circumflex (LCx):  normal   OM1: normal Right coronary artery (RCA):  Normal   PDA: 30% proximal  PLA: normal  Left ventriculography: Left ventricular systolic function is normal, LVEF is estimated at 55-65%, there is no significant mitral regurgitation     Recommendations:  Reviewed with Dr Martinique PCI/Stent mid LAD  Jenkins Rouge MD, [facility] 07/17/2014, 1:05 PM

## 2014-07-17 NOTE — CV Procedure (Signed)
CARDIAC CATH NOTE  Name: Riley Jones MRN: [id number] DOB: [birth year]  Procedure: PTCA and stenting of the mid LAD  Indication: 58 yo WM with progressive angina. Diagnostic procedure demonstrated an 80-90% stenosis in the mid LAD.  Procedural Details: The right wrist 6 Fr. Sheath was used.  Weight-based bivalirudin was given for anticoagulation. Effient 60 mg was given orally.  Once a therapeutic ACT was achieved, a 6 [place] XBLAD 3.5 guide catheter was inserted.  A prowater coronary guidewire was used to cross the lesion.  The lesion was predilated with a 2.5 mm balloon.  The lesion was then stented with a 3.0 x 15 mm Xience alpine stent.  The stent was postdilated with a 3.25 mm noncompliant balloon.  Following PCI, there was 0% residual stenosis and TIMI-3 flow. There was some shift of plaque into the first diagonal but it continued to have excellent flow.  Final angiography confirmed an excellent result. The patient tolerated the procedure well. There were no immediate procedural complications. A TR band was used for radial hemostasis. The patient was transferred to the post catheterization recovery area for further monitoring.  Lesion Data: Vessel: LAD-mid Percent stenosis (pre): 80-90% TIMI-flow (pre):  3 Stent:  3.0 x 15 mm Xience alpine Percent stenosis (post): 0% TIMI-flow (post): 3  Conclusions: Successful stenting of the mid LAD with DES.  Recommendations: Continue DAPT for one year. Anticipate DC in am.  Vasily Fedewa [place], MDFACC 07/17/2014, 1:43 PM

## 2014-07-17 NOTE — Progress Notes (Signed)
TR BAND REMOVAL  LOCATION:  right radial  DEFLATED PER PROTOCOL:  Yes.    TIME BAND OFF / DRESSING APPLIED:   1800   SITE UPON ARRIVAL:   Level 0  SITE AFTER BAND REMOVAL:  Level 0  REVERSE ALLEN'S TEST:    positive  CIRCULATION SENSATION AND MOVEMENT:  Within Normal Limits  Yes.    COMMENTS:

## 2014-07-17 NOTE — H&P (View-Only) (Signed)
Patient ID: Riley Jones, male   DOB: 06/17/1956, 58 y.o.   MRN: [id number]    59 yo referred by [facility] for chest pain.  He has HIV since 80's likely from " poor choice in partners"  IDDM for over 10 years Poorly controlled.  A1c over 12 earlier in year and still over 8.  Still smoking Recenlty started on Welbutrin.  SSCP with exertion.  Occasionally at rest but mostly after fixed amount of exertion like walking.  Mild exertional dyspnea. No palpitations or presyncope has no known diagnosis of CAD but has not had stress testing before.  Denies diabetic complications other than neuropathic pain in feet.  Comliant with statin and BP meds.  Not on ACE for renal protection.  Some component of COPD from smoking No fever cough or sputum No opportunistic infections  Last CD 4 count 760     ROS: Denies fever, malais, weight loss, blurry vision, decreased visual acuity, cough, sputum, SOB, hemoptysis, pleuritic pain, palpitaitons, heartburn, abdominal pain, melena, lower extremity edema, claudication, or rash.  All other systems reviewed and negative   General: Affect appropriate Healthy:  appears stated age 27: normal Neck supple with no adenopathy JVP normal no bruits no thyromegaly Lungs clear with no wheezing and good diaphragmatic motion Heart:  S1/S2 no murmur,rub, gallop or click PMI normal Abdomen: benighn, BS positve, no tenderness, no AAA no bruit.  No HSM or HJR Distal pulses intact with no bruits No edema Neuro non-focal Skin warm and dry No muscular weakness  Medications Current Outpatient Prescriptions  Medication Sig Dispense Refill  . albuterol (VENTOLIN HFA) 108 (90 BASE) MCG/ACT inhaler Inhale 1-2 puffs into the lungs every 4 (four) hours as needed. for shortness of breath. Or may use every 6 hours as needed.       . ALPRAZolam (XANAX) 0.5 MG tablet TAKE ONE TABLET BY MOUTH AT BEDTIME AS NEEDED  30 tablet  0  . amLODipine (NORVASC) 10 MG tablet TAKE ONE TABLET  BY MOUTH ONCE DAILY  31 tablet  3  . aspirin 81 MG EC tablet Take 81 mg by mouth daily.        Marland Kitchen buPROPion (WELLBUTRIN SR) 150 MG 12 hr tablet Take 1 tablet (150 mg total) by mouth 2 (two) times daily.  60 tablet  2  . clotrimazole-betamethasone (LOTRISONE) cream Apply to affected area 2 times daily  15 g  1  . diphenhydrAMINE (BENADRYL) 50 MG capsule TAKE ONE CAPSULE BY MOUTH AT BEDTIME AS NEEDED FOR  ITCHING  30 capsule  0  . efavirenz-emtricitabine-tenofovir (ATRIPLA) 600-200-300 MG per tablet TAKE 1 TABLET BY MOUTH DAILY  30 tablet  0  . fluticasone (FLONASE) 50 MCG/ACT nasal spray       . folic acid (FOLVITE) 1 MG tablet Take 1 tablet (1 mg total) by mouth daily.  30 tablet  3  . glucose blood (FREESTYLE LITE) test strip USE ONE STRIP TO CHECK GLUCOSE TWICE DAILY  100 each  3  . hydrochlorothiazide (HYDRODIURIL) 25 MG tablet TAKE ONE TABLET BY MOUTH ONCE DAILY  30 tablet  3  . HYDROcodone-acetaminophen (NORCO) 7.5-325 MG per tablet Take 1 tablet by mouth every 6 (six) hours as needed for moderate pain.  60 tablet  0  . insulin NPH Human (HUMULIN N) 100 UNIT/ML injection Inject 7 Units into the skin 2 (two) times daily before a meal.  10 mL  3  . Insulin Pen Needle 31G X 5 MM MISC Use  it for insulin injection  100 each  11  . Insulin Syringe-Needle U-100 (B-D INS SYRINGE 0.5CC/30GX1/2") 30G X 1/2" 0.5 ML MISC USE 1 SYRINGE IN AM AND I IN THE EVENING. DX CODE: 250.00.  100 each  2  . loratadine (CLARITIN) 10 MG tablet Take 1 tablet (10 mg total) by mouth daily.  30 tablet  1  . metFORMIN (GLUCOPHAGE) 1000 MG tablet TAKE ONE TABLET BY MOUTH TWICE DAILY. TAKE WITH A MEAL.  180 tablet  0  . Multiple Vitamin (MULTIVITAMIN WITH MINERALS) TABS Take 1 tablet by mouth daily.      Marland Kitchen omeprazole (PRILOSEC) 20 MG capsule Take 1 capsule (20 mg total) by mouth daily.  30 capsule  prn  . pravastatin (PRAVACHOL) 40 MG tablet TAKE ONE TABLET BY MOUTH AT BEDTIME  90 tablet  0  . terazosin (HYTRIN) 2 MG capsule  TAKE TWO CAPSULES BY MOUTH AT BEDTIME  60 capsule  0   No current facility-administered medications for this visit.    Allergies Penicillins; Chantix; and Zoloft  Family History: Family History  Problem Relation Age of Onset  . Osteoporosis Mother   . Heart disease Father     Social History: History   Social History  . Marital Status: Single    Spouse Name: N/A    Number of Children: N/A  . Years of Education: 13   Occupational History  . Retired Administrator    Social History Main Topics  . Smoking status: Current Every Day Smoker -- 1.00 packs/day for 42 years    Types: Cigarettes  . Smokeless tobacco: Never Used     Comment: Smoking a little less. 1 ppd. will start welbutrin  . Alcohol Use: 0.6 oz/week    1 Glasses of wine per week     Comment: occasional wine  . Drug Use: No  . Sexual Activity: No     Comment: given condoms   Other Topics Concern  . Not on file   Social History Narrative   [employer] approved beginning 12/11/2009 - 12/27/2010   [employer] benefits approved; patient eligible for 100% discount for out patient labs and office visits. Patient eligible for 70% discount for other services per Irish Elders 09/08/2010      Patient is on disability, and walks 2-3 times per week.     Electrocardiogram:  SR rate 75  Lateral T wave changes   Assessment and Plan

## 2014-07-17 NOTE — Care Management Note (Addendum)
  Page 1 of 1   07/17/2014     3:41:43 PM CARE MANAGEMENT NOTE 07/17/2014  Patient:  Riley Jones, Riley Jones   Account Number:  [id number]  Date Initiated:  07/17/2014  Documentation initiated by:  Makailah Slavick  Subjective/Objective Assessment:   Unstable angina     Action/Plan:   CM to follow for disposition needs   Anticipated DC Date:  07/18/2014   Anticipated DC Plan:           Choice offered to / List presented to:             Status of service:  Completed, signed off Medicare Important Message given?   (If response is "NO", the following Medicare IM given date fields will be blank) Date Medicare IM given:   Medicare IM given by:   Date Additional Medicare IM given:   Additional Medicare IM given by:    Discharge Disposition:  HOME/SELF CARE  Per UR Regulation:    If discussed at Long Length of Stay Meetings, dates discussed:    Comments:  Flint Hakeem RN, BSN, MSHL, CCM  Nurse - Case Manager,  (Unit [phone]  07/17/2014 Procedure: Left Heart Cath, Selective Coronary Angiography, LV angiography on 07/17/14 Med Review:  prasugrel (EFFIENT) tablet 10 mg - Benefits check in progress __________ Dispo Plan:  Home self care.

## 2014-07-17 NOTE — Interval H&P Note (Signed)
History and Physical Interval Note:  07/17/2014 11:22 AM  Riley Jones  has presented today for surgery, with the diagnosis of cp  The various methods of treatment have been discussed with the patient and family. After consideration of risks, benefits and other options for treatment, the patient has consented to  Procedure(s): LEFT HEART CATHETERIZATION WITH CORONARY ANGIOGRAM (N/A) as a surgical intervention .  The patient's history has been reviewed, patient examined, no change in status, stable for surgery.  I have reviewed the patient's chart and labs.  Questions were answered to the patient's satisfaction.     Jenkins Rouge

## 2014-07-18 ENCOUNTER — Encounter (HOSPITAL_COMMUNITY): Payer: Self-pay | Admitting: Physician Assistant

## 2014-07-18 DIAGNOSIS — I200 Unstable angina: Secondary | ICD-10-CM

## 2014-07-18 DIAGNOSIS — B20 Human immunodeficiency virus [HIV] disease: Secondary | ICD-10-CM | POA: Diagnosis not present

## 2014-07-18 DIAGNOSIS — I10 Essential (primary) hypertension: Secondary | ICD-10-CM | POA: Diagnosis not present

## 2014-07-18 DIAGNOSIS — I209 Angina pectoris, unspecified: Secondary | ICD-10-CM

## 2014-07-18 DIAGNOSIS — J410 Simple chronic bronchitis: Secondary | ICD-10-CM

## 2014-07-18 DIAGNOSIS — F17210 Nicotine dependence, cigarettes, uncomplicated: Secondary | ICD-10-CM | POA: Diagnosis not present

## 2014-07-18 DIAGNOSIS — I25110 Atherosclerotic heart disease of native coronary artery with unstable angina pectoris: Secondary | ICD-10-CM | POA: Diagnosis not present

## 2014-07-18 LAB — GLUCOSE, CAPILLARY: GLUCOSE-CAPILLARY: 238 mg/dL — AB (ref 70–99)

## 2014-07-18 LAB — BASIC METABOLIC PANEL
Anion gap: 14 (ref 5–15)
BUN: 12 mg/dL (ref 6–23)
CO2: 25 meq/L (ref 19–32)
Calcium: 9.1 mg/dL (ref 8.4–10.5)
Chloride: 101 mEq/L (ref 96–112)
Creatinine, Ser: 0.79 mg/dL (ref 0.50–1.35)
GFR calc Af Amer: >90 mL/min (ref >90)
GFR calc non Af Amer: >90 mL/min (ref >90)
GLUCOSE: 221 mg/dL — AB (ref 70–99)
Potassium: 3.4 mEq/L — ABNORMAL LOW (ref 3.7–5.3)
Sodium: 140 mEq/L (ref 137–147)

## 2014-07-18 LAB — CBC
HEMATOCRIT: 43.9 % (ref 39.0–52.0)
HEMOGLOBIN: 15.5 g/dL (ref 13.0–17.0)
MCH: 33 pg (ref 26.0–34.0)
MCHC: 35.3 g/dL (ref 30.0–36.0)
MCV: 93.6 fL (ref 78.0–100.0)
Platelets: 216 10*3/uL (ref 150–400)
RBC: 4.69 MIL/uL (ref 4.22–5.81)
RDW: 12.8 % (ref 11.5–15.5)
WBC: 7 10*3/uL (ref 4.0–10.5)

## 2014-07-18 MED ORDER — POTASSIUM CHLORIDE CRYS ER 10 MEQ PO TBCR: 10.0000 meq | EXTENDED_RELEASE_TABLET | Freq: Every day | ORAL | Status: DC

## 2014-07-18 MED ORDER — POTASSIUM CHLORIDE CRYS ER 20 MEQ PO TBCR
40.0000 meq | EXTENDED_RELEASE_TABLET | Freq: Once | ORAL | Status: AC
Start: 2014-07-18 — End: 2014-07-18
  Administered 2014-07-18: 11:00:00 40 meq via ORAL
  Filled 2014-07-18: qty 2

## 2014-07-18 MED ORDER — PRASUGREL HCL 10 MG PO TABS: 10.0000 mg | ORAL_TABLET | Freq: Every day | ORAL | Status: DC

## 2014-07-18 MED FILL — Sodium Chloride IV Soln 0.9%: INTRAVENOUS | Qty: 50 | Status: AC

## 2014-07-18 NOTE — Discharge Instructions (Signed)
Angina Pectoris Angina pectoris is extreme discomfort in your chest, neck, or arm. Your doctor may call it just angina. It is caused by a lack of oxygen to your heart wall. It may feel like tightness or heavy pressure. It may feel like a crushing or squeezing pain. Some people say it feels like gas. It may go down your shoulders, back, and arms. Some people have symptoms other than pain. These include:  Tiredness.  Shortness of breath.  Cold sweats.  Feeling sick to your stomach (nausea). There are four types of angina:  Stable angina. This type often lasts the same amount of time each time it happens. Activity, stress, or excitement can bring it on. It often gets better after taking a medicine called nitroglycerin. This goes under your tongue.  Unstable angina. This type can happen when you are not active or even during sleep. It can suddenly get worse or happen more often. It may not get better after taking the special medicine. It can last up to 30 minutes.  Microvascular angina. This type is more common in women. It may be more severe or last longer than other types.  Prinzmetal angina. This type often happens when you are not active or in the early morning hours. HOME CARE   Only take medicines as told by your doctor.  Stay active or exercise more as told by your doctor.  Limit very hard activity as told by your doctor.  Limit heavy lifting as told by your doctor.  Keep a healthy weight.  Learn about and eat foods that are healthy for your heart.  Do not use any tobacco such as cigarettes, chewing tobacco, or e-cigarettes. GET HELP RIGHT AWAY IF:   You have chest, neck, deep shoulder, or arm pain or discomfort that lasts more than a few minutes.  You have chest, neck, deep shoulder, or arm pain or discomfort that goes away and comes back over and over again.  You have heavy sweating that seems to happen for no reason.  You have shortness of breath or trouble  breathing.  Your angina does not get better after a few minutes of rest.  Your angina does not get better after you take nitroglycerin medicine. These can all be symptoms of a heart attack. Get help right away. Call your local emergency service (911 in [place] Do not  drive yourself to the hospital. Do not  wait to for your symptoms to go away. MAKE SURE YOU:   Understand these instructions.  Will watch your condition.  Will get help right away if you are not doing well or get worse. Document Released: 03/02/2008 Document Revised: 09/19/2013 Document Reviewed: 01/16/2014 [facility] Patient Information 2015 [place], [place]. This information is not intended to replace advice given to you by your health care provider. Make sure you discuss any questions you have with your health care provider.  No driving for 24 hours. No lifting over 5 lbs for 1 week. No sexual activity for 1 week. Keep procedure site clean & dry. If you notice increased pain, swelling, bleeding or pus, call/return!  You may shower, but no soaking baths/hot tubs/pools for 1 week.   Please hold Metformin for 48 hours after cardiac cath due to interaction with contrast dye. You can resume metformin on 07/20/2014

## 2014-07-18 NOTE — Discharge Summary (Signed)
The patient had no recurrence of symptoms post PCI with DES to LAD. He ambulated vigorously with rehab this AM. He has no abnormality on exam and cath site is unremarkable. Renal function is normal.  He will follow-up with primary cardiologist in 2 weeks. DAPT importance discussed and understood by the patient.

## 2014-07-18 NOTE — Progress Notes (Signed)
   Patient Name: Riley Jones Date of Encounter: 07/18/2014     Principal Problem:   Unstable angina Active Problems:   Essential hypertension   COPD (chronic obstructive pulmonary disease)   Smoker    SUBJECTIVE  Denies any CP or SOB, feeling well. Ambulated a little yesterday without discomfort.  CURRENT MEDS . amLODipine  10 mg Oral Daily  . aspirin EC  81 mg Oral Daily  . efavirenz-emtricitabine-tenofovir  1 tablet Oral Daily  . folic acid  1 mg Oral Daily  . hydrochlorothiazide  25 mg Oral Daily  . insulin aspart  0-9 Units Subcutaneous TID WC  . insulin NPH Human  12 Units Subcutaneous BID AC  . loratadine  10 mg Oral Daily  . multivitamin with minerals  1 tablet Oral Daily  . pantoprazole  40 mg Oral Daily  . prasugrel  10 mg Oral Daily  . pravastatin  40 mg Oral Daily  . terazosin  2 mg Oral QHS    OBJECTIVE  Filed Vitals:   07/17/14 2032 07/17/14 2356 07/18/14 0051 07/18/14 0604  BP: 156/75 125/69  139/76  Pulse: 63 66  63  Temp: 98 F (36.7 C) 98 F (36.7 C)  97.5 F (36.4 C)  TempSrc: Oral Oral  Oral  Resp: 16 18  20   Height:      Weight:   195 lb 15.8 oz (88.9 kg)   SpO2: 97% 96%  96%    Intake/Output Summary (Last 24 hours) at 07/18/14 0634 Last data filed at 07/18/14 0052  Gross per 24 hour  Intake 1020.38 ml  Output    350 ml  Net 670.38 ml   Filed Weights   07/17/14 0923 07/18/14 0051  Weight: 195 lb (88.451 kg) 195 lb 15.8 oz (88.9 kg)    PHYSICAL EXAM  General: Pleasant, NAD. Neuro: Alert and oriented X 3. Moves all extremities spontaneously. Psych: Normal affect. HEENT:  Normal  Neck: Supple without bruits or JVD. Lungs:  Resp regular and unlabored, CTA. Heart: RRR no s3, s4, or murmurs. R radial cath site stable without significant hematoma or bleeding Abdomen: Soft, non-tender, non-distended, BS + x 4.  Extremities: No clubbing, cyanosis or edema. DP/PT/Radials 2+ and equal bilaterally.  Accessory Clinical  Findings  CBC  Recent Labs  07/18/14 0250  WBC 7.0  HGB 15.5  HCT 43.9  MCV 93.6  PLT 053   Basic Metabolic Panel  Recent Labs  07/18/14 0250  NA 140  K 3.4*  CL 101  CO2 25  GLUCOSE 221*  BUN 12  CREATININE 0.79  CALCIUM 9.1    TELE NSR with HR 50-70s, occasional HR in high 40s, no significant ventricular ectopy    ECG  Sinus brady with HR 60, nonspecific T wave changes   Radiology/Studies  No results found.  ASSESSMENT AND PLAN  1. Progressive angina  - cath 07/17/2014 EF 55-60%, 30% prox PDA, 40-50% prox LAD, 80% mid LAD stenosis at D2 take off s/p DES  - continue ASA and effient. Not on a BB likely due to bradycardia  - likely discharge today  2. HTN - controlled on amlodipine, HCTZ 3. HLD 4. DM 5. Tobacco abuse: discussed tobacco cessation 6. HIV  Signed, Almyra Deforest PA-C Pager: [phone]

## 2014-07-18 NOTE — Progress Notes (Signed)
Up ambulating and doing well. We need to supplement potassium and discharge for f/u later this AM.

## 2014-07-18 NOTE — Progress Notes (Signed)
D/c instructions reviewed with pt and family, copy of instructions and scripts given to pt. Pt d/c'd via ambulation with family, steady gait. Pt d/c'd with belongings with family.

## 2014-07-18 NOTE — Progress Notes (Signed)
CARDIAC REHAB PHASE I   PRE:  Rate/Rhythm: 69 SR  BP:  Supine: 129/71  Sitting:   Standing:    SaO2:   MODE:  Ambulation: 1000 ft   POST:  Rate/Rhythm: 74 SR  BP:  Supine:   Sitting: 144/72  Standing:    SaO2:  0820-0920 Pt tolerated ambulation well without c/o of cp or SOB. Pt able to walk 1000 feet. VS stable Pt to side of bed after walk with call light in reach. Completed stent discharge education with pt. He voices understanding. Pt agrees to Outpt CRP in [place], will sed referral. We discussed smoking cessation. Pt is committed to quitting and plans to do it "cold [place]." I gave him tips for quitting, coaching contact number and quit smart class information. He voices a desire to making lifestyle changes.  Rodney Langton RN 07/18/2014 10:17 AM

## 2014-07-18 NOTE — Discharge Summary (Signed)
Discharge Summary   Patient ID: Riley Jones,  MRN: [id number], DOB/AGE: 1956-03-14 58 y.o.  Admit date: 07/17/2014 Discharge date: 07/18/2014  Primary Care Provider: Aldine Contes Primary Cardiologist: Dr. Johnsie Cancel  Discharge Diagnoses Principal Problem:   Unstable angina  - cath 07/17/2014 EF 55-60%, 30% prox PDA, 40-50% prox LAD, 80% mid LAD stenosis at D2 take off s/p DES  - on ASA and effient, not on BB with bradycardia  Active Problems:   Essential hypertension   COPD (chronic obstructive pulmonary disease)   Smoker   Allergies Allergies  Allergen Reactions  . Penicillins Nausea Only and Rash  . Zoloft [Sertraline Hcl] Other (See Comments)    Patient reported Psychomotor slowing / worsened depression  . Chantix [Varenicline] Hives    Procedures  Cardiac catheterization 07/17/2014 Lesion Data:  Vessel: LAD-mid  Percent stenosis (pre): 80-90%  TIMI-flow (pre): 3  Stent: 3.0 x 15 mm Xience alpine  Percent stenosis (post): 0%  TIMI-flow (post): 3  Conclusions: Successful stenting of the mid LAD with DES.  Recommendations: Continue DAPT for one year. Anticipate DC in am.     Hospital Course  The patient is a 58 year old Caucasian male with past medical history of hypertension, DM, HIV, COPD and tobacco abuse who had no obvious history of coronary artery disease. He was referred to Dr. Johnsie Cancel by community health for exertional chest discomfort. He was seen by Dr. Johnsie Cancel on 07/03/2014. It was felt his symptom likely represent progressive angina. Different treatment strategy has been discussed with the patient, eventually it was decided for him to undergo a cardiac catheterization.  Patient presented to [facility] 07[facility]17[facility]2014 for scheduled cardiac catheterization which showed EF 55-60%, 30% proximal PDA stenosis, 40-50% proximal LAD stenosis, 80% mid LAD stenosis at D2 takeoff status post drug-eluting stent. He was placed on aspirin and Effient. He is not  currently on beta blocker, however his heart rate is in the 50s to 60s with occasional high 40s.  He was seen the morning of 07/18/2014, at which time he denies any significant chest discomfort or shortness breath. He is deemed stable for discharge from cardiology perspective. He has been seen by cardiac rehab. He ambulated without discomfort. I will set up followup with Dr. Johnsie Cancel in the clinic in 2-4 weeks. I have also given the patient one year of Effient prescription along with thirty-day Effient prescription to use with coupon.   Discharge Vitals Blood pressure 144/72, pulse 73, temperature 97.7 F (36.5 C), temperature source Oral, resp. rate 20, height 5\' 10"  (1.778 m), weight 195 lb 15.8 oz (88.9 kg), SpO2 96.00%.  Filed Weights   07/17/14 0923 07/18/14 0051  Weight: 195 lb (88.451 kg) 195 lb 15.8 oz (88.9 kg)    Labs  CBC  Recent Labs  07/18/14 0250  WBC 7.0  HGB 15.5  HCT 43.9  MCV 93.6  PLT 476   Basic Metabolic Panel  Recent Labs  07/18/14 0250  NA 140  K 3.4*  CL 101  CO2 25  GLUCOSE 221*  BUN 12  CREATININE 0.79  CALCIUM 9.1    Disposition  Pt is being discharged home today in good condition.  Follow-up Plans & Appointments      Follow-up Information   Follow up with Truitt Merle, NP On 08/08/2014. (9:00am)    Specialty:  Nurse Practitioner   Contact information:   [address] [place] [address] [phone]       Discharge Medications  Medication List         ALPRAZolam 0.5 MG tablet  Commonly known as:  XANAX  Take 0.5 mg by mouth at bedtime as needed for anxiety.     amLODipine 10 MG tablet  Commonly known as:  NORVASC  Take 10 mg by mouth daily.     aspirin 81 MG EC tablet  Take 81 mg by mouth daily.     efavirenz-emtricitabine-tenofovir 600-200-300 MG per tablet  Commonly known as:  ATRIPLA  Take 1 tablet by mouth daily.     folic acid 1 MG tablet  Commonly known as:  FOLVITE  Take 1 tablet (1  mg total) by mouth daily.     FREESTYLE LITE test strip  Generic drug:  glucose blood  1 each by Other route See admin instructions. Check blood sugar 2-3 times daily.     hydrochlorothiazide 25 MG tablet  Commonly known as:  HYDRODIURIL  Take 25 mg by mouth daily.     HYDROcodone-acetaminophen 7.5-325 MG per tablet  Commonly known as:  NORCO  Take 1 tablet by mouth every 6 (six) hours as needed for moderate pain.     ibuprofen 200 MG tablet  Commonly known as:  ADVIL,MOTRIN  Take 400 mg by mouth every 6 (six) hours as needed for moderate pain.     insulin NPH Human 100 UNIT/ML injection  Commonly known as:  HUMULIN N  Inject 0.12 mLs (12 Units total) into the skin 2 (two) times daily before a meal.     loratadine 10 MG tablet  Commonly known as:  CLARITIN  Take 1 tablet (10 mg total) by mouth daily.     metFORMIN 1000 MG tablet  Commonly known as:  GLUCOPHAGE  Take 1,000 mg by mouth 2 (two) times daily with a meal.     multivitamin with minerals Tabs tablet  Take 1 tablet by mouth daily.     nitroGLYCERIN 0.4 MG SL tablet  Commonly known as:  NITROSTAT  Place 1 tablet (0.4 mg total) under the tongue every 5 (five) minutes as needed for chest pain.     omeprazole 20 MG capsule  Commonly known as:  PRILOSEC  Take 1 capsule (20 mg total) by mouth daily.     prasugrel 10 MG Tabs tablet  Commonly known as:  EFFIENT  Take 1 tablet (10 mg total) by mouth daily.     pravastatin 40 MG tablet  Commonly known as:  PRAVACHOL  Take 40 mg by mouth daily.     terazosin 2 MG capsule  Commonly known as:  HYTRIN  TAKE TWO CAPSULES BY MOUTH AT BEDTIME.     VENTOLIN HFA 108 (90 BASE) MCG/ACT inhaler  Generic drug:  albuterol  Inhale 1 puff into the lungs every 4 (four) hours as needed. for shortness of breath. Or may use every 6 hours as needed.        Duration of Discharge Encounter   Greater than 30 minutes including physician time.  Hilbert Corrigan PA-C Pager:  [phone] 07/18/2014, 9:34 AM

## 2014-07-19 ENCOUNTER — Other Ambulatory Visit: Payer: Self-pay | Admitting: Internal Medicine

## 2014-07-19 DIAGNOSIS — B20 Human immunodeficiency virus [HIV] disease: Secondary | ICD-10-CM

## 2014-07-23 ENCOUNTER — Other Ambulatory Visit: Payer: Self-pay | Admitting: *Deleted

## 2014-07-23 MED ORDER — HYDROCODONE-ACETAMINOPHEN 7.5-325 MG PO TABS: 1.0000 | ORAL_TABLET | Freq: Four times a day (QID) | ORAL | Status: DC | PRN

## 2014-07-25 ENCOUNTER — Other Ambulatory Visit: Payer: Self-pay | Admitting: *Deleted

## 2014-07-25 DIAGNOSIS — K219 Gastro-esophageal reflux disease without esophagitis: Secondary | ICD-10-CM

## 2014-07-25 MED ORDER — OMEPRAZOLE 20 MG PO CPDR: 20.0000 mg | DELAYED_RELEASE_CAPSULE | Freq: Every day | ORAL | Status: DC

## 2014-08-08 ENCOUNTER — Ambulatory Visit (INDEPENDENT_AMBULATORY_CARE_PROVIDER_SITE_OTHER): Payer: Commercial Managed Care - HMO | Admitting: Nurse Practitioner

## 2014-08-08 ENCOUNTER — Encounter: Payer: Self-pay | Admitting: Nurse Practitioner

## 2014-08-08 ENCOUNTER — Telehealth: Payer: Self-pay | Admitting: *Deleted

## 2014-08-08 VITALS — BP 148/86 | HR 58 | Ht 70.0 in | Wt 198.8 lb

## 2014-08-08 DIAGNOSIS — Z79899 Other long term (current) drug therapy: Secondary | ICD-10-CM

## 2014-08-08 DIAGNOSIS — I10 Essential (primary) hypertension: Secondary | ICD-10-CM

## 2014-08-08 DIAGNOSIS — I259 Chronic ischemic heart disease, unspecified: Secondary | ICD-10-CM

## 2014-08-08 DIAGNOSIS — E7800 Pure hypercholesterolemia, unspecified: Secondary | ICD-10-CM

## 2014-08-08 DIAGNOSIS — Z955 Presence of coronary angioplasty implant and graft: Secondary | ICD-10-CM

## 2014-08-08 DIAGNOSIS — J439 Emphysema, unspecified: Secondary | ICD-10-CM

## 2014-08-08 LAB — BASIC METABOLIC PANEL
BUN: 13 mg/dL (ref 6–23)
CO2: 27 mEq/L (ref 19–32)
Calcium: 9.7 mg/dL (ref 8.4–10.5)
Chloride: 102 mEq/L (ref 96–112)
Creatinine, Ser: 0.8 mg/dL (ref 0.4–1.5)
GFR: 109.47 mL/min (ref >60.00)
Glucose, Bld: 185 mg/dL — ABNORMAL HIGH (ref 70–99)
Potassium: 3.7 mEq/L (ref 3.5–5.1)
Sodium: 138 mEq/L (ref 135–145)

## 2014-08-08 LAB — CBC
HCT: 46.2 % (ref 39.0–52.0)
Hemoglobin: 15.6 g/dL (ref 13.0–17.0)
MCHC: 33.7 g/dL (ref 30.0–36.0)
MCV: 94.2 fl (ref 78.0–100.0)
Platelets: 202 10*3/uL (ref 150.0–400.0)
RBC: 4.9 Mil/uL (ref 4.22–5.81)
RDW: 13.3 % (ref 11.5–15.5)
WBC: 8.5 10*3/uL (ref 4.0–10.5)

## 2014-08-08 MED ORDER — LISINOPRIL 10 MG PO TABS: 10.0000 mg | ORAL_TABLET | Freq: Every day | ORAL | Status: DC

## 2014-08-08 NOTE — Progress Notes (Addendum)
Riley Jones Date of Birth: 06-15-1956 Medical Record #[id number]  History of Present Illness: Riley Jones is seen back today for a post cath visit - seen for Dr. Johnsie Cancel. He is a 58 year old male with CAD, HTN, HIV, COPD and ongoing tobacco use.  Most recently presented to Dr. Johnsie Cancel from the [facility] with exertional chest pain. Referred for cardiac cath - noted to now have CAD and had DES to a mid LAD stenosis at D2 takeoff. He is on aspirin and brilinta. Not on beta blocker due to resting bradycardia. EF was normal.  Comes back today. Here alone.  He says he is feeling better but not as good as he thought he would be. Still has some "picky" chest pain. No NTG use. Getting better every day. Continues to smoke - but less. Back at work. Still with some issues of fatigue. Breathing may be a bit better. He notes that he "can spit further". Taking his medicines as directed. Understands importance of Brilinta. Overall, he feels like he is 75% improved from prior to his cath/PCI.   Current Outpatient Prescriptions  Medication Sig Dispense Refill  . albuterol (VENTOLIN HFA) 108 (90 BASE) MCG/ACT inhaler Inhale 1 puff into the lungs every 4 (four) hours as needed. for shortness of breath. Or may use every 6 hours as needed.    . ALPRAZolam (XANAX) 0.5 MG tablet Take 0.5 mg by mouth at bedtime as needed for anxiety.    Marland Kitchen amLODipine (NORVASC) 10 MG tablet Take 10 mg by mouth daily.    Marland Kitchen aspirin 81 MG EC tablet Take 81 mg by mouth daily.      . ATRIPLA 600-200-300 MG per tablet TAKE 1 TABLET BY MOUTH EVERY DAY 30 tablet 5  . BuPROPion HCl (WELLBUTRIN PO) Take by mouth daily.    . folic acid (FOLVITE) 1 MG tablet Take 1 tablet (1 mg total) by mouth daily. 30 tablet 3  . glucose blood (FREESTYLE LITE) test strip 1 each by Other route See admin instructions. Check blood sugar 2-3 times daily.    . hydrochlorothiazide (HYDRODIURIL) 25 MG tablet Take 25 mg by mouth daily.    Marland Kitchen  HYDROcodone-acetaminophen (NORCO) 7.5-325 MG per tablet Take 1 tablet by mouth every 6 (six) hours as needed for moderate pain. 60 tablet 0  . ibuprofen (ADVIL,MOTRIN) 200 MG tablet Take 400 mg by mouth every 6 (six) hours as needed for moderate pain.    Marland Kitchen insulin NPH Human (HUMULIN N) 100 UNIT/ML injection Inject 0.12 mLs (12 Units total) into the skin 2 (two) times daily before a meal. 10 mL 3  . loratadine (CLARITIN) 10 MG tablet Take 1 tablet (10 mg total) by mouth daily. (Patient taking differently: Take 10 mg by mouth daily as needed. ) 30 tablet 1  . metFORMIN (GLUCOPHAGE) 1000 MG tablet Take 1,000 mg by mouth 2 (two) times daily with a meal.    . Multiple Vitamin (MULTIVITAMIN WITH MINERALS) TABS Take 1 tablet by mouth daily.    . nitroGLYCERIN (NITROSTAT) 0.4 MG SL tablet Place 1 tablet (0.4 mg total) under the tongue every 5 (five) minutes as needed for chest pain. 25 tablet 3  . omeprazole (PRILOSEC) 20 MG capsule Take 1 capsule (20 mg total) by mouth daily. 30 capsule prn  . prasugrel (EFFIENT) 10 MG TABS tablet Take 1 tablet (10 mg total) by mouth daily. 30 tablet 11  . pravastatin (PRAVACHOL) 40 MG tablet Take 40 mg by mouth  daily.    . terazosin (HYTRIN) 2 MG capsule TAKE TWO CAPSULES BY MOUTH AT BEDTIME. 60 capsule 3   No current facility-administered medications for this visit.    Allergies  Allergen Reactions  . Penicillins Nausea Only and Rash  . Zoloft [Sertraline Hcl] Other (See Comments)    Patient reported Psychomotor slowing / worsened depression  . Chantix [Varenicline] Hives    Past Medical History  Diagnosis Date  . Hypertension     Well Controlled off meds  . 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 Ola Spurr (ID)  . Hyperlipidemia   . Atypical angina     Myoview 2003, Normal EF 64%  . Foot pain 03/30/2011  . COPD (chronic obstructive pulmonary disease)     "mild" (07/17/2014)  . Asthma     "mild"  (07/17/2014)  . Pneumonia 4-5 times  . Sleep apnea     does not wear mask (07/17/2014)  . Type II diabetes mellitus   . GERD (gastroesophageal reflux disease)   . H/O hiatal hernia   . Headache     "weekly" (06/2014)  . Rheumatoid arthritis(714.0)     Followed by Dr. Charlestine Night, off MTX and prdnisone since 2007  . Arthritis     "pretty much all over"  . History of gout   . Anxiety   . Depression   . Kidney stones     "passed them all"  . CAD (coronary artery disease)     a. 10/20 DES to 80% mid LAD    Past Surgical History  Procedure Laterality Date  . Uvulopalatopharyngoplasty (uppp)/tonsillectomy/septoplasty  1990's  . Coronary angioplasty with stent placement  07/17/2014    a. DES to 80% mid LAD  . Tonsillectomy and adenoidectomy  1990's  . Appendectomy  ~ 1981  . Carpal tunnel release Left 1990's  . Trigger finger release Left 1990's    2nd & 5th digits  . Hernia repair    . Umbilical hernia repair  1980's    w/mesh  . Laparoscopic incisional / umbilical / ventral hernia repair  1990's    "it was a double; not inguinal"    History  Smoking status  . Current Every Day Smoker -- 1.00 packs/day for 46 years  . Types: Cigarettes  Smokeless tobacco  . Never Used    History  Alcohol Use  . 0.6 oz/week  . 1 Glasses of wine per week    Comment: 07/17/2014 "glass of wine once or twice/month"    Family History  Problem Relation Age of Onset  . Osteoporosis Mother   . Heart disease Father     Review of Systems: The review of systems is per the HPI.  All other systems were reviewed and are negative.  Physical Exam: BP 148/86 mmHg  Pulse 58  Ht 5\' 10"  (1.778 m)  Wt 198 lb 12.8 oz (90.175 kg)  BMI 28.52 kg/m2 Patient is very pleasant and in no acute distress. Smells of tobacco. Skin is warm and dry. Color is normal.  HEENT is unremarkable. Normocephalic/atraumatic. PERRL. Sclera are nonicteric. Neck is supple. No masses. No JVD. Lungs are coarse. Cardiac exam  shows a regular rate and rhythm. Heart tones distant. Abdomen is soft. Extremities are without edema. Gait and ROM are intact. No gross neurologic deficits noted. Right wrist looks fine.   Wt Readings from Last 3 Encounters:  08/08/14 198 lb 12.8 oz (90.175 kg)  07/18/14 195 lb 15.8 oz (  88.9 kg)  07/05/14 197 lb 9.6 oz (89.631 kg)    LABORATORY DATA/PROCEDURES: EKG pending  Lab Results  Component Value Date   WBC 7.0 07/18/2014   HGB 15.5 07/18/2014   HCT 43.9 07/18/2014   PLT 216 07/18/2014   GLUCOSE 221* 07/18/2014   CHOL 157 05/30/2014   TRIG 159* 05/30/2014   HDL 43 05/30/2014   LDLCALC 82 05/30/2014   ALT 54* 05/30/2014   AST 33 05/30/2014   NA 140 07/18/2014   K 3.4* 07/18/2014   CL 101 07/18/2014   CREATININE 0.79 07/18/2014   BUN 12 07/18/2014   CO2 25 07/18/2014   TSH 1.242 12/14/2011   PSA 1.09 05/07/2009   INR 0.9 07/10/2014   HGBA1C 10.1 07/05/2014   MICROALBUR 0.50 03/09/2014    BNP (last 3 results)  Recent Labs  12/28/13 1244  PROBNP 26.86  Cardiac Catheterization Procedure Note  Name: Riley Jones MRN: [id number] DOB: 12-13-1955  Procedure: Left Heart Cath, Selective Coronary Angiography, LV angiography  Indication: Angina  Procedural Details: The right wrist was prepped, draped, and anesthetized with 1% lidocaine. Using the modified Seldinger technique, a 5/6 French Slender sheath was introduced into the right radial artery. 3 mg of verapamil was administered through the sheath, weight-based unfractionated heparin was administered intravenously. Standard Judkins catheters were used for selective coronary angiography and left ventriculography. Catheter exchanges were performed over an exchange length guidewire. There were no immediate procedural complications. A TR band was used for radial hemostasis at the completion of the procedure. The patient was transferred to the post catheterization recovery area for further  monitoring.  Procedural Findings: Hemodynamics: AO 118 70  LV 123 12  Coronary angiography: Coronary dominance: right  Left mainstem: Normal  Left anterior descending (LAD): 40-50% proximal 80% mid at D2 take off shelf like lesion  D1: normal D2: normal D3: normal  Left circumflex (LCx): normal  OM1: normal Right coronary artery (RCA): Normal  PDA: 30% proximal PLA: normal  Left ventriculography: Left ventricular systolic function is normal, LVEF is estimated at 55-65%, there is no significant mitral regurgitation     Recommendations: Reviewed with Dr Martinique PCI/Stent mid LAD  Jenkins Rouge MD, [facility] 07/17/2014, 1:05 PM   CARDIAC CATH NOTE  Name: Riley Jones MRN: [id number] DOB: March 15, 1956  Procedure: PTCA and stenting of the mid LAD  Indication: 58 yo WM with progressive angina. Diagnostic procedure demonstrated an 80-90% stenosis in the mid LAD.  Procedural Details: The right wrist 6 Fr. Sheath was used. Weight-based bivalirudin was given for anticoagulation. Effient 60 mg was given orally. Once a therapeutic ACT was achieved, a 6 [place] XBLAD 3.5 guide catheter was inserted. A prowater coronary guidewire was used to cross the lesion. The lesion was predilated with a 2.5 mm balloon. The lesion was then stented with a 3.0 x 15 mm Xience alpine stent. The stent was postdilated with a 3.25 mm noncompliant balloon. Following PCI, there was 0% residual stenosis and TIMI-3 flow. There was some shift of plaque into the first diagonal but it continued to have excellent flow. Final angiography confirmed an excellent result. The patient tolerated the procedure well. There were no immediate procedural complications. A TR band was used for radial hemostasis. The patient was transferred to the post catheterization recovery area for further monitoring.  Lesion Data: Vessel: LAD-mid Percent  stenosis (pre): 80-90% TIMI-flow (pre): 3 Stent: 3.0 x 15 mm Xience alpine Percent stenosis (post): 0% TIMI-flow (post): 3  Conclusions: Successful stenting  of the mid LAD with DES.  Recommendations: Continue DAPT for one year. Anticipate DC in am.  Peter Martinique, [place] 07/17/2014, 1:43 PM    Assessment / Plan: 1. Unstable angina with recent cath and PCI to the mid LAD with DES - committed to DAPT for one year -  Symptoms have improved - not totally resolved. Check EKG today. Has NTG on hand. Continue with DAPT.  2. HTN -  Low dose ACE started - he has been on higher doses in the past - looks like he had issues with dizziness on higher doses. Check follow up lab today and BMET on return.   3. Tobacco abuse -  Cessation encouraged.   4. COPD  5. HLD -  On statin therapy  Check follow up labs today. See Dr. Johnsie Cancel in 6 weeks. He is to call if does not continue to improve or if has to start using NTG.   Patient is agreeable to this plan and will call if any problems develop in the interim.   Burtis Junes, RN, [place] [address] [place], [place]  [phone]  Addendum:  EKG today shows NSR with lateral T wave changes - more than noted on prior tracing - reviewed with Dr. Acie Fredrickson (DOD) - he agrees with the current plan of care - patient has NTG on hand and needs to let us know if he has change or increased frequency of symptoms.

## 2014-08-08 NOTE — Patient Instructions (Addendum)
We will be checking the following labs today BMET and CBC  If you do not continue to keep improving or if you have to start using NTG - please call us and let us know  Smoking cessation strongly encouraged  Stay on your current medicines but I am adding Lisinopril 10 mg a day -  This has been sent to your drug store  Try to check your blood pressure and keep a diary  BMET in 2 weeks  See Dr. Johnsie Cancel in 6 weeks  Call the [place] office at [phone] if you have any questions, problems or concerns.

## 2014-08-08 NOTE — Telephone Encounter (Signed)
Patient forgot to ask today if it is ok for him to use Ibuprofen 1 to 2 times a week.  Will forward to Tera Helper NP for review

## 2014-08-08 NOTE — Telephone Encounter (Signed)
-----   Message from Burtis Junes, NP sent at 08/08/2014  4:11 PM EST ----- Ok to report. Labs are satisfactory.

## 2014-08-09 NOTE — Telephone Encounter (Signed)
Advised patient, verbalized understanding  

## 2014-08-09 NOTE — Telephone Encounter (Signed)
Would favor using tylenol instead since he is on DAPT for the next year.

## 2014-08-11 ENCOUNTER — Other Ambulatory Visit: Payer: Self-pay | Admitting: Internal Medicine

## 2014-08-13 ENCOUNTER — Other Ambulatory Visit: Payer: Self-pay | Admitting: Internal Medicine

## 2014-08-13 NOTE — Telephone Encounter (Signed)
Rx called in to pharmacy and pt aware. Hilda Blades Jalayiah Bibian RN 08/13/14 4:45PM

## 2014-08-20 ENCOUNTER — Other Ambulatory Visit: Payer: Self-pay | Admitting: *Deleted

## 2014-08-20 ENCOUNTER — Other Ambulatory Visit (INDEPENDENT_AMBULATORY_CARE_PROVIDER_SITE_OTHER): Payer: Commercial Managed Care - HMO | Admitting: *Deleted

## 2014-08-20 ENCOUNTER — Other Ambulatory Visit: Payer: Self-pay | Admitting: Internal Medicine

## 2014-08-20 DIAGNOSIS — Z955 Presence of coronary angioplasty implant and graft: Secondary | ICD-10-CM

## 2014-08-20 DIAGNOSIS — E780 Pure hypercholesterolemia: Secondary | ICD-10-CM

## 2014-08-20 DIAGNOSIS — I259 Chronic ischemic heart disease, unspecified: Secondary | ICD-10-CM

## 2014-08-20 DIAGNOSIS — J439 Emphysema, unspecified: Secondary | ICD-10-CM

## 2014-08-20 DIAGNOSIS — Z79899 Other long term (current) drug therapy: Secondary | ICD-10-CM

## 2014-08-20 DIAGNOSIS — I10 Essential (primary) hypertension: Secondary | ICD-10-CM

## 2014-08-20 LAB — BASIC METABOLIC PANEL
BUN: 14 mg/dL (ref 6–23)
CO2: 25 mEq/L (ref 19–32)
Calcium: 9.4 mg/dL (ref 8.4–10.5)
Chloride: 98 mEq/L (ref 96–112)
Creatinine, Ser: 1 mg/dL (ref 0.4–1.5)
GFR: 82.87 mL/min (ref >60.00)
Glucose, Bld: 195 mg/dL — ABNORMAL HIGH (ref 70–99)
Potassium: 3.7 mEq/L (ref 3.5–5.1)
Sodium: 134 mEq/L — ABNORMAL LOW (ref 135–145)

## 2014-08-20 MED ORDER — HYDROCODONE-ACETAMINOPHEN 7.5-325 MG PO TABS: 1.0000 | ORAL_TABLET | Freq: Four times a day (QID) | ORAL | Status: DC | PRN

## 2014-08-20 NOTE — Telephone Encounter (Signed)
Last refill 10/26

## 2014-08-21 ENCOUNTER — Telehealth: Payer: Self-pay | Admitting: Cardiovascular Disease

## 2014-08-21 ENCOUNTER — Other Ambulatory Visit: Payer: Commercial Managed Care - HMO

## 2014-08-21 NOTE — Telephone Encounter (Signed)
New message    Calling for test results  

## 2014-08-27 NOTE — Telephone Encounter (Signed)
PER PT   DISREGARD MESSAGE  .Adonis Housekeeper

## 2014-09-04 NOTE — Progress Notes (Signed)
Patient ID: Riley Jones, male   DOB: January 22, 1956, 58 y.o.   MRN: [id number] Riley Jones is seen back today for a post cath visit . He is a 58 year old male with CAD, HTN, HIV, COPD and ongoing tobacco use.  Most recently presented to me from the community health services with exertional chest pain. Referred for cardiac cath  10/15 - noted to now have CAD and had DES to a mid LAD stenosis at D2 takeoff. He is on aspirin and brilinta. Not on beta blocker due to resting bradycardia. EF was normal.  Seen by PA last month and feeling better but not as good as he thought he would be. Still has some "picky" chest pain. No NTG use. Getting better every day. Continues to smoke - but less. Back at work. Still with some issues of fatigue. Breathing may be a bit better. He notes that he "can spit further". Taking his medicines as directed. Understands importance of Brilinta. Overall, he feels like he is 75% improved from prior to his cath/PCI.  Some erectile dysfunction told him he couldn't use viagra or similar products No angina Retired from [employer] on smoking cessation for less than 10 minutes   ROS: Denies fever, malais, weight loss, blurry vision, decreased visual acuity, cough, sputum, SOB, hemoptysis, pleuritic pain, palpitaitons, heartburn, abdominal pain, melena, lower extremity edema, claudication, or rash.  All other systems reviewed and negative  General: Affect appropriate Overweight white male  HEENT: normal Neck supple with no adenopathy JVP normal no bruits no thyromegaly Lungs clear with no wheezing and good diaphragmatic motion Heart:  S1/S2 no murmur, no rub, gallop or click PMI normal Abdomen: benighn, BS positve, no tenderness, no AAA no bruit.  No HSM or HJR Distal pulses intact with no bruits No edema Neuro non-focal Skin warm and dry No muscular weakness   Current Outpatient Prescriptions  Medication Sig Dispense Refill  . albuterol (VENTOLIN HFA) 108  (90 BASE) MCG/ACT inhaler Inhale 1 puff into the lungs every 4 (four) hours as needed. for shortness of breath. Or may use every 6 hours as needed.    . ALPRAZolam (XANAX) 0.5 MG tablet TAKE ONE TABLET BY MOUTH AT BEDTIME AS NEEDED 30 tablet 0  . amLODipine (NORVASC) 10 MG tablet Take 10 mg by mouth daily.    Marland Kitchen aspirin 81 MG EC tablet Take 81 mg by mouth daily.      . ATRIPLA 600-200-300 MG per tablet TAKE 1 TABLET BY MOUTH EVERY DAY 30 tablet 5  . B-D INS SYRINGE 0.5CC/30GX1/2" 30G X 1/2" 0.5 ML MISC     . BuPROPion HCl (WELLBUTRIN PO) Take by mouth daily.    . folic acid (FOLVITE) 1 MG tablet Take 1 tablet (1 mg total) by mouth daily. 30 tablet 3  . glucose blood (FREESTYLE LITE) test strip 1 each by Other route See admin instructions. Check blood sugar 2-3 times daily.    . hydrochlorothiazide (HYDRODIURIL) 25 MG tablet Take 25 mg by mouth daily.    Marland Kitchen HYDROcodone-acetaminophen (NORCO) 7.5-325 MG per tablet Take 1 tablet by mouth every 6 (six) hours as needed for moderate pain. 60 tablet 0  . ibuprofen (ADVIL,MOTRIN) 200 MG tablet Take 400 mg by mouth every 6 (six) hours as needed for moderate pain.    Marland Kitchen insulin NPH Human (HUMULIN N) 100 UNIT/ML injection Inject 0.12 mLs (12 Units total) into the skin 2 (two) times daily before a meal. 10 mL 3  .  lisinopril (PRINIVIL,ZESTRIL) 10 MG tablet Take 1 tablet (10 mg total) by mouth daily. 90 tablet 3  . loratadine (CLARITIN) 10 MG tablet Take 1 tablet (10 mg total) by mouth daily. (Patient taking differently: Take 10 mg by mouth daily as needed. ) 30 tablet 1  . metFORMIN (GLUCOPHAGE) 1000 MG tablet Take 1,000 mg by mouth 2 (two) times daily with a meal.    . Multiple Vitamin (MULTIVITAMIN WITH MINERALS) TABS Take 1 tablet by mouth daily.    . nitroGLYCERIN (NITROSTAT) 0.4 MG SL tablet Place 1 tablet (0.4 mg total) under the tongue every 5 (five) minutes as needed for chest pain. 25 tablet 3  . omeprazole (PRILOSEC) 20 MG capsule Take 1 capsule (20 mg  total) by mouth daily. 30 capsule prn  . prasugrel (EFFIENT) 10 MG TABS tablet Take 1 tablet (10 mg total) by mouth daily. 30 tablet 11  . pravastatin (PRAVACHOL) 40 MG tablet Take 40 mg by mouth daily.    Marland Kitchen terazosin (HYTRIN) 2 MG capsule TAKE TWO CAPSULES BY MOUTH AT BEDTIME. 60 capsule 3  . ZOSTAVAX 82707 UNT/0.65ML injection   0   No current facility-administered medications for this visit.    Allergies  Penicillins; Zoloft; and Chantix  Electrocardiogram: SR rate 58 anterolateral T wave changes   Assessment and Plan

## 2014-09-05 ENCOUNTER — Ambulatory Visit (INDEPENDENT_AMBULATORY_CARE_PROVIDER_SITE_OTHER): Payer: Commercial Managed Care - HMO | Admitting: Cardiovascular Disease

## 2014-09-05 ENCOUNTER — Other Ambulatory Visit: Payer: Self-pay | Admitting: Infectious Diseases

## 2014-09-05 ENCOUNTER — Encounter: Payer: Self-pay | Admitting: Cardiovascular Disease

## 2014-09-05 VITALS — BP 130/76 | HR 61 | Ht 70.0 in | Wt 200.0 lb

## 2014-09-05 DIAGNOSIS — N5201 Erectile dysfunction due to arterial insufficiency: Secondary | ICD-10-CM

## 2014-09-05 DIAGNOSIS — E1059 Type 1 diabetes mellitus with other circulatory complications: Secondary | ICD-10-CM

## 2014-09-05 DIAGNOSIS — I2583 Coronary atherosclerosis due to lipid rich plaque: Secondary | ICD-10-CM

## 2014-09-05 DIAGNOSIS — F17200 Nicotine dependence, unspecified, uncomplicated: Secondary | ICD-10-CM

## 2014-09-05 DIAGNOSIS — I2510 Atherosclerotic heart disease of native coronary artery without angina pectoris: Secondary | ICD-10-CM | POA: Insufficient documentation

## 2014-09-05 DIAGNOSIS — I10 Essential (primary) hypertension: Secondary | ICD-10-CM

## 2014-09-05 DIAGNOSIS — Z720 Tobacco use: Secondary | ICD-10-CM

## 2014-09-05 DIAGNOSIS — J410 Simple chronic bronchitis: Secondary | ICD-10-CM

## 2014-09-05 NOTE — Assessment & Plan Note (Signed)
Counseled for less than 10 mintues  Little motivation to quit favor chantix or welbutrin will call in if he requests

## 2014-09-05 NOTE — Assessment & Plan Note (Signed)
A1c has been as high as 10 Improved diet f/u primary Discussed low carb issues

## 2014-09-05 NOTE — Assessment & Plan Note (Signed)
Related to smoking DM, and vascular disease f/u urology no viagra

## 2014-09-05 NOTE — Assessment & Plan Note (Signed)
Related to smoking moderate response to inhalers  Yearly chest Xray benign exam today

## 2014-09-05 NOTE — Assessment & Plan Note (Signed)
Well controlled.  Continue current medications and low sodium Dash type diet.    

## 2014-09-05 NOTE — Assessment & Plan Note (Signed)
Stable with no angina and good activity level.  Continue medical Rx Brillinta until 10/16 but while smoking will likely continue indefinately

## 2014-09-05 NOTE — Patient Instructions (Signed)
Your physician wants you to follow-up in:  6 MONTHS WITH DR NISHAN  You will receive a reminder letter in the mail two months in advance. If you don't receive a letter, please call our office to schedule the follow-up appointment. Your physician recommends that you continue on your current medications as directed. Please refer to the Current Medication list given to you today. 

## 2014-09-06 ENCOUNTER — Encounter (HOSPITAL_COMMUNITY): Payer: Self-pay | Admitting: Cardiovascular Disease

## 2014-09-08 ENCOUNTER — Other Ambulatory Visit: Payer: Self-pay | Admitting: Internal Medicine

## 2014-09-13 ENCOUNTER — Other Ambulatory Visit: Payer: Self-pay | Admitting: Internal Medicine

## 2014-09-14 MED ORDER — ALPRAZOLAM 0.5 MG PO TABS: 0.5000 mg | ORAL_TABLET | Freq: Every evening | ORAL | Status: DC | PRN

## 2014-09-14 NOTE — Telephone Encounter (Signed)
Rx called in 

## 2014-09-17 ENCOUNTER — Other Ambulatory Visit: Payer: Self-pay | Admitting: Internal Medicine

## 2014-09-18 ENCOUNTER — Other Ambulatory Visit: Payer: Self-pay | Admitting: *Deleted

## 2014-09-18 ENCOUNTER — Other Ambulatory Visit: Payer: Self-pay | Admitting: Internal Medicine

## 2014-09-18 MED ORDER — HYDROCODONE-ACETAMINOPHEN 7.5-325 MG PO TABS: 1.0000 | ORAL_TABLET | Freq: Four times a day (QID) | ORAL | Status: DC | PRN

## 2014-09-18 NOTE — Telephone Encounter (Signed)
Pt informed Rx is ready 

## 2014-09-18 NOTE — Telephone Encounter (Signed)
Needs by wed

## 2014-09-22 ENCOUNTER — Other Ambulatory Visit: Payer: Self-pay | Admitting: Internal Medicine

## 2014-09-29 ENCOUNTER — Other Ambulatory Visit: Payer: Self-pay | Admitting: Internal Medicine

## 2014-10-01 NOTE — Telephone Encounter (Signed)
Needs Jan / Feb appt PCP if possible. Otherwise, [facility] res routine F?U inc DM

## 2014-10-13 ENCOUNTER — Other Ambulatory Visit: Payer: Self-pay | Admitting: Internal Medicine

## 2014-10-17 ENCOUNTER — Other Ambulatory Visit: Payer: Self-pay | Admitting: Internal Medicine

## 2014-10-18 ENCOUNTER — Other Ambulatory Visit: Payer: Self-pay | Admitting: Internal Medicine

## 2014-10-18 MED ORDER — HYDROCODONE-ACETAMINOPHEN 7.5-325 MG PO TABS: 1.0000 | ORAL_TABLET | Freq: Four times a day (QID) | ORAL | Status: DC | PRN

## 2014-11-07 ENCOUNTER — Ambulatory Visit: Payer: Medicare HMO

## 2014-11-08 ENCOUNTER — Other Ambulatory Visit: Payer: Self-pay | Admitting: *Deleted

## 2014-11-08 DIAGNOSIS — B20 Human immunodeficiency virus [HIV] disease: Secondary | ICD-10-CM

## 2014-11-08 MED ORDER — EFAVIRENZ-EMTRICITAB-TENOFOVIR 600-200-300 MG PO TABS
1.0000 | ORAL_TABLET | Freq: Every day | ORAL | Status: DC
Start: 2014-11-08 — End: 2015-03-13

## 2014-11-16 ENCOUNTER — Other Ambulatory Visit: Payer: Self-pay | Admitting: Internal Medicine

## 2014-11-19 ENCOUNTER — Other Ambulatory Visit: Payer: Self-pay | Admitting: Internal Medicine

## 2014-11-20 ENCOUNTER — Other Ambulatory Visit: Payer: Self-pay | Admitting: *Deleted

## 2014-11-20 MED ORDER — ALPRAZOLAM 0.5 MG PO TABS: 0.5000 mg | ORAL_TABLET | Freq: Every evening | ORAL | Status: DC | PRN

## 2014-11-20 MED ORDER — HYDROCODONE-ACETAMINOPHEN 7.5-325 MG PO TABS: 1.0000 | ORAL_TABLET | Freq: Four times a day (QID) | ORAL | Status: DC | PRN

## 2014-11-20 MED ORDER — METFORMIN HCL 1000 MG PO TABS: 1000.0000 mg | ORAL_TABLET | Freq: Two times a day (BID) | ORAL | Status: DC

## 2014-11-22 ENCOUNTER — Encounter: Payer: Self-pay | Admitting: Internal Medicine

## 2014-11-22 ENCOUNTER — Ambulatory Visit (INDEPENDENT_AMBULATORY_CARE_PROVIDER_SITE_OTHER): Payer: Commercial Managed Care - HMO | Admitting: Internal Medicine

## 2014-11-22 ENCOUNTER — Ambulatory Visit (HOSPITAL_COMMUNITY)
Admission: RE | Admit: 2014-11-22 | Discharge: 2014-11-22 | Disposition: A | Discharge status: Home / Self Care | Payer: Commercial Managed Care - HMO | Source: Ambulatory Visit | Attending: Internal Medicine | Admitting: Internal Medicine

## 2014-11-22 VITALS — BP 140/92 | HR 51 | Temp 97.5°F | Ht 70.0 in | Wt 199.2 lb

## 2014-11-22 DIAGNOSIS — F17200 Nicotine dependence, unspecified, uncomplicated: Secondary | ICD-10-CM

## 2014-11-22 DIAGNOSIS — E119 Type 2 diabetes mellitus without complications: Secondary | ICD-10-CM | POA: Diagnosis not present

## 2014-11-22 DIAGNOSIS — Z79899 Other long term (current) drug therapy: Secondary | ICD-10-CM | POA: Diagnosis not present

## 2014-11-22 DIAGNOSIS — R079 Chest pain, unspecified: Secondary | ICD-10-CM | POA: Diagnosis not present

## 2014-11-22 DIAGNOSIS — I10 Essential (primary) hypertension: Secondary | ICD-10-CM

## 2014-11-22 DIAGNOSIS — Z79891 Long term (current) use of opiate analgesic: Secondary | ICD-10-CM

## 2014-11-22 DIAGNOSIS — M069 Rheumatoid arthritis, unspecified: Secondary | ICD-10-CM

## 2014-11-22 DIAGNOSIS — R0789 Other chest pain: Secondary | ICD-10-CM

## 2014-11-22 DIAGNOSIS — E118 Type 2 diabetes mellitus with unspecified complications: Secondary | ICD-10-CM

## 2014-11-22 DIAGNOSIS — F17210 Nicotine dependence, cigarettes, uncomplicated: Secondary | ICD-10-CM | POA: Diagnosis not present

## 2014-11-22 LAB — GLUCOSE, CAPILLARY: Glucose-Capillary: 167 mg/dL — ABNORMAL HIGH (ref 70–99)

## 2014-11-22 LAB — BASIC METABOLIC PANEL WITH GFR
BUN: 13 mg/dL (ref 6–23)
CO2: 24 meq/L (ref 19–32)
CREATININE: 0.82 mg/dL (ref 0.50–1.35)
Calcium: 9.7 mg/dL (ref 8.4–10.5)
Chloride: 101 mEq/L (ref 96–112)
GFR, Est Non African American: >89 mL/min
GLUCOSE: 147 mg/dL — AB (ref 70–99)
Potassium: 4.1 mEq/L (ref 3.5–5.3)
Sodium: 136 mEq/L (ref 135–145)

## 2014-11-22 LAB — POCT GLYCOSYLATED HEMOGLOBIN (HGB A1C): Hemoglobin A1C: 7.3

## 2014-11-22 MED ORDER — ACCU-CHEK NANO SMARTVIEW W/DEVICE KIT: PACK | Status: DC

## 2014-11-22 MED ORDER — GLUCOSE BLOOD VI STRP: ORAL_STRIP | Status: DC

## 2014-11-22 MED ORDER — FOLIC ACID 1 MG PO TABS: 1.0000 mg | ORAL_TABLET | Freq: Every day | ORAL | Status: AC

## 2014-11-22 MED ORDER — ACCU-CHEK FASTCLIX LANCETS MISC: Status: DC

## 2014-11-22 NOTE — Progress Notes (Signed)
   Subjective:    Patient ID: Riley Jones, male    DOB: 05-29-1956, 59 y.o.   MRN: [id number]  HPI Pt here for routine follow up.  He does complain of intermittent left sided chest pain which is pressure like, non radiating, 10/10 at its worst and had a bad episode last night. He took NTG which temporarily relieved the pain. No diaphoresis, no n/v, no abd pain. Pt recently had an angiogram with mid LAD occlusion s/p DES placement and has been on effient and asa.  Pt states his blood sugars have been under better control at home and that he needs a new meter as his insurance won't cover the test strips for his old one. Pt also needs an eye exam but he wants to defer it at this time for financial reasons  Pt is still smoking and wants to quit. He states that bupropion did not help him and that chantix gave him weird dreams. He is going to try nicotine patches.  He also complains of persistent joint pain but does not want to follow up with rheumatology at this point secondary to financial reasons.   Review of Systems  Constitutional: Negative.   HENT: Negative.   Respiratory: Negative.   Cardiovascular: Negative.   Gastrointestinal: Negative.   Musculoskeletal: Positive for arthralgias.  Skin: Negative.   Neurological: Negative.   Psychiatric/Behavioral: Negative.        Objective:   Physical Exam  Constitutional: He is oriented to person, place, and time. He appears well-developed and well-nourished.  HENT:  Head: Normocephalic and atraumatic.  Eyes: Conjunctivae are normal.  Neck: Normal range of motion. Neck supple.  Cardiovascular: Normal rate, regular rhythm and normal heart sounds.   Pulmonary/Chest: Effort normal. No respiratory distress. He has no wheezes.  Abdominal: Soft. Bowel sounds are normal. He exhibits no distension. There is no tenderness.  Musculoskeletal: Normal range of motion. He exhibits no edema or tenderness.  Neurological: He is alert and oriented to  person, place, and time.  Skin: Skin is warm and dry.  Psychiatric: He has a normal mood and affect. His behavior is normal.          Assessment & Plan:  Please see problem based charting for assessment and plan:

## 2014-11-22 NOTE — Assessment & Plan Note (Signed)
BP Readings from Last 3 Encounters:  11/22/14 140/92  09/05/14 130/76  08/08/14 148/86    Lab Results  Component Value Date   NA 134* 08/20/2014   K 3.7 08/20/2014   CREATININE 1.0 08/20/2014    Assessment: Blood pressure control:  well controlled Progress toward BP goal:   near goal Comments: pt is compliant with meds  Plan: Medications:  continue current medications Educational resources provided: brochure (denies) Self management tools provided:   Other plans: Will follow up in 3 months

## 2014-11-22 NOTE — Assessment & Plan Note (Signed)
-   will check UDS today - patient will need to sign pain contract on his next visit

## 2014-11-22 NOTE — Assessment & Plan Note (Signed)
  Assessment: Progress toward smoking cessation:   improved Barriers to progress toward smoking cessation:   unable to tolerate chantix and bupropion did not help him Comments: patient is now motivated toq uit  Plan: Instruction/counseling given:  I counseled patient on the dangers of tobacco use, advised patient to stop smoking, and reviewed strategies to maximize success. Educational resources provided:  [employer] account manager) brochure (denies) Self management tools provided:    Medications to assist with smoking cessation:  Nicotine Patch Patient agreed to the following self-care plans for smoking cessation:   [facility] where he can obtain them for free. Other plans:

## 2014-11-22 NOTE — Assessment & Plan Note (Addendum)
Lab Results  Component Value Date   HGBA1C 7.3 11/22/2014   HGBA1C 10.1 07/05/2014   HGBA1C 8.2 04/02/2014     Assessment: Diabetes control:  good Progress toward A1C goal:   improved Comments: pt is compliant with diet and meds  Plan: Medications:  continue current medications Home glucose monitoring: Frequency:   Timing:   Instruction/counseling given: reminded to get eye exam, reminded to bring blood glucose meter & log to each visit, discussed foot care and discussed diet Educational resources provided: brochure (denies) Self management tools provided: copy of home glucose meter download Other plans: will check BMP and lipid profile today. New meter and strips prescribed in compliance with insurance. Check urine microalbumin today

## 2014-11-22 NOTE — Assessment & Plan Note (Signed)
-   patient wishes to defer referral to rheum for now - he will continuie with current meds for pain control - Will check UDS today

## 2014-11-22 NOTE — Patient Instructions (Signed)
- It was a pleasure seeing you today - We will check your blood work today - Your A1C is much improved. Keep up the good work - I have reordered your folic acid - We will check your urine today as well  Smoking Cessation Quitting smoking is important to your health and has many advantages. However, it is not always easy to quit since nicotine is a very addictive drug. Oftentimes, people try 3 times or more before being able to quit. This document explains the best ways for you to prepare to quit smoking. Quitting takes hard work and a lot of effort, but you can do it. ADVANTAGES OF QUITTING SMOKING  You will live longer, feel better, and live better.  Your body will feel the impact of quitting smoking almost immediately.  Within 20 minutes, blood pressure decreases. Your pulse returns to its normal level.  After 8 hours, carbon monoxide levels in the blood return to normal. Your oxygen level increases.  After 24 hours, the chance of having a heart attack starts to decrease. Your breath, hair, and body stop smelling like smoke.  After 48 hours, damaged nerve endings begin to recover. Your sense of taste and smell improve.  After 72 hours, the body is virtually free of nicotine. Your bronchial tubes relax and breathing becomes easier.  After 2 to 12 weeks, lungs can hold more air. Exercise becomes easier and circulation improves.  The risk of having a heart attack, stroke, cancer, or lung disease is greatly reduced.  After 1 year, the risk of coronary heart disease is cut in half.  After 5 years, the risk of stroke falls to the same as a nonsmoker.  After 10 years, the risk of lung cancer is cut in half and the risk of other cancers decreases significantly.  After 15 years, the risk of coronary heart disease drops, usually to the level of a nonsmoker.  If you are pregnant, quitting smoking will improve your chances of having a healthy baby.  The people you live with, especially  any children, will be healthier.  You will have extra money to spend on things other than cigarettes. QUESTIONS TO THINK ABOUT BEFORE ATTEMPTING TO QUIT You may want to talk about your answers with your health care provider.  Why do you want to quit?  If you tried to quit in the past, what helped and what did not?  What will be the most difficult situations for you after you quit? How will you plan to handle them?  Who can help you through the tough times? Your family? Friends? A health care provider?  What pleasures do you get from smoking? What ways can you still get pleasure if you quit? Here are some questions to ask your health care provider:  How can you help me to be successful at quitting?  What medicine do you think would be best for me and how should I take it?  What should I do if I need more help?  What is smoking withdrawal like? How can I get information on withdrawal? GET READY  Set a quit date.  Change your environment by getting rid of all cigarettes, ashtrays, matches, and lighters in your home, car, or work. Do not let people smoke in your home.  Review your past attempts to quit. Think about what worked and what did not. GET SUPPORT AND ENCOURAGEMENT You have a better chance of being successful if you have help. You can get support in many  ways.  Tell your family, friends, and coworkers that you are going to quit and need their support. Ask them not to smoke around you.  Get individual, group, or telephone counseling and support. Programs are available at [employer] and health centers. Call your local health department for information about programs in your area.  Spiritual beliefs and practices may help some smokers quit.  Download a "quit meter" on your computer to keep track of quit statistics, such as how long you have gone without smoking, cigarettes not smoked, and money saved.  Get a self-help book about quitting smoking and staying off  tobacco. Randall yourself from urges to smoke. Talk to someone, go for a walk, or occupy your time with a task.  Change your normal routine. Take a different route to work. Drink tea instead of coffee. Eat breakfast in a different place.  Reduce your stress. Take a hot bath, exercise, or read a book.  Plan something enjoyable to do every day. Reward yourself for not smoking.  Explore interactive web-based programs that specialize in helping you quit. GET MEDICINE AND USE IT CORRECTLY Medicines can help you stop smoking and decrease the urge to smoke. Combining medicine with the above behavioral methods and support can greatly increase your chances of successfully quitting smoking.  Nicotine replacement therapy helps deliver nicotine to your body without the negative effects and risks of smoking. Nicotine replacement therapy includes nicotine gum, lozenges, inhalers, nasal sprays, and skin patches. Some may be available over-the-counter and others require a prescription.  Antidepressant medicine helps people abstain from smoking, but how this works is unknown. This medicine is available by prescription.  Nicotinic receptor partial agonist medicine simulates the effect of nicotine in your brain. This medicine is available by prescription. Ask your health care provider for advice about which medicines to use and how to use them based on your health history. Your health care provider will tell you what side effects to look out for if you choose to be on a medicine or therapy. Carefully read the information on the package. Do not use any other product containing nicotine while using a nicotine replacement product.  RELAPSE OR DIFFICULT SITUATIONS Most relapses occur within the first 3 months after quitting. Do not be discouraged if you start smoking again. Remember, most people try several times before finally quitting. You may have symptoms of withdrawal because  your body is used to nicotine. You may crave cigarettes, be irritable, feel very hungry, cough often, get headaches, or have difficulty concentrating. The withdrawal symptoms are only temporary. They are strongest when you first quit, but they will go away within 10-14 days. To reduce the chances of relapse, try to:  Avoid drinking alcohol. Drinking lowers your chances of successfully quitting.  Reduce the amount of caffeine you consume. Once you quit smoking, the amount of caffeine in your body increases and can give you symptoms, such as a rapid heartbeat, sweating, and anxiety.  Avoid smokers because they can make you want to smoke.  Do not let weight gain distract you. Many smokers will gain weight when they quit, usually less than 10 pounds. Eat a healthy diet and stay active. You can always lose the weight gained after you quit.  Find ways to improve your mood other than smoking. FOR MORE INFORMATION  www.smokefree.gov  Document Released: 09/08/2001 Document Revised: 01/29/2014 Document Reviewed: 12/24/2011 [facility] Patient Information 2015 [place], [place]. This information is not intended to replace  advice given to you by your health care provider. Make sure you discuss any questions you have with your health care provider.  

## 2014-11-22 NOTE — Assessment & Plan Note (Signed)
-   pt with intermittent chest pain of uncertain etiology - repeat EKG with no acute changes (persistent lateral T wave inversions present on old EKG) - c/w asa, effient and NTG prn - Will refer back to cardio (has an appointment on Monday 2/29) - c/w BP control with lisinopril, norvasc, HCTZ - c/w statin

## 2014-11-23 LAB — PRESCRIPTION ABUSE MONITORING 15P, URINE
Amphetamine/Meth: NEGATIVE ng/mL
Barbiturate Screen, Urine: NEGATIVE ng/mL
Benzodiazepine Screen, Urine: NEGATIVE ng/mL
Buprenorphine, Urine: NEGATIVE ng/mL
CANNABINOID SCRN UR: NEGATIVE ng/mL
COCAINE METABOLITES: NEGATIVE ng/mL
CREATININE, URINE: 58.69 mg/dL (ref >20.0)
Carisoprodol, Urine: NEGATIVE ng/mL
Fentanyl, Ur: NEGATIVE ng/mL
METHADONE SCREEN, URINE: NEGATIVE ng/mL
Meperidine, Ur: NEGATIVE ng/mL
OXYCODONE SCRN UR: NEGATIVE ng/mL
Opiate Screen, Urine: NEGATIVE ng/mL
PROPOXYPHENE: NEGATIVE ng/mL
Tramadol Scrn, Ur: NEGATIVE ng/mL
Zolpidem, Urine: NEGATIVE ng/mL

## 2014-11-23 LAB — MICROALBUMIN / CREATININE URINE RATIO
Creatinine, Urine: 56.5 mg/dL
Microalb Creat Ratio: 33.6 mg/g — ABNORMAL HIGH (ref 0.0–30.0)
Microalb, Ur: 1.9 mg/dL (ref <2.0)

## 2014-11-26 ENCOUNTER — Encounter: Payer: Self-pay | Admitting: Physician Assistant

## 2014-11-26 ENCOUNTER — Ambulatory Visit (INDEPENDENT_AMBULATORY_CARE_PROVIDER_SITE_OTHER): Payer: Commercial Managed Care - HMO | Admitting: Physician Assistant

## 2014-11-26 ENCOUNTER — Ambulatory Visit
Admission: RE | Admit: 2014-11-26 | Discharge: 2014-11-26 | Disposition: A | Discharge status: Home / Self Care | Payer: Commercial Managed Care - HMO | Source: Ambulatory Visit | Attending: Physician Assistant | Admitting: Physician Assistant

## 2014-11-26 ENCOUNTER — Other Ambulatory Visit (INDEPENDENT_AMBULATORY_CARE_PROVIDER_SITE_OTHER): Payer: Commercial Managed Care - HMO

## 2014-11-26 VITALS — BP 140/80 | HR 64 | Ht 70.0 in | Wt 197.0 lb

## 2014-11-26 DIAGNOSIS — Z720 Tobacco use: Secondary | ICD-10-CM | POA: Diagnosis not present

## 2014-11-26 DIAGNOSIS — I10 Essential (primary) hypertension: Secondary | ICD-10-CM | POA: Diagnosis not present

## 2014-11-26 DIAGNOSIS — F17200 Nicotine dependence, unspecified, uncomplicated: Secondary | ICD-10-CM | POA: Diagnosis not present

## 2014-11-26 DIAGNOSIS — R079 Chest pain, unspecified: Secondary | ICD-10-CM

## 2014-11-26 DIAGNOSIS — M47814 Spondylosis without myelopathy or radiculopathy, thoracic region: Secondary | ICD-10-CM | POA: Diagnosis not present

## 2014-11-26 DIAGNOSIS — Z79899 Other long term (current) drug therapy: Secondary | ICD-10-CM | POA: Diagnosis not present

## 2014-11-26 DIAGNOSIS — B20 Human immunodeficiency virus [HIV] disease: Secondary | ICD-10-CM | POA: Diagnosis not present

## 2014-11-26 DIAGNOSIS — I2510 Atherosclerotic heart disease of native coronary artery without angina pectoris: Secondary | ICD-10-CM | POA: Diagnosis not present

## 2014-11-26 DIAGNOSIS — Z113 Encounter for screening for infections with a predominantly sexual mode of transmission: Secondary | ICD-10-CM | POA: Diagnosis not present

## 2014-11-26 DIAGNOSIS — E785 Hyperlipidemia, unspecified: Secondary | ICD-10-CM

## 2014-11-26 DIAGNOSIS — J42 Unspecified chronic bronchitis: Secondary | ICD-10-CM

## 2014-11-26 LAB — CBC
HCT: 42.9 % (ref 39.0–52.0)
Hemoglobin: 15.3 g/dL (ref 13.0–17.0)
MCH: 32.9 pg (ref 26.0–34.0)
MCHC: 35.7 g/dL (ref 30.0–36.0)
MCV: 92.3 fL (ref 78.0–100.0)
MPV: 9.7 fL (ref 8.6–12.4)
Platelets: 190 10*3/uL (ref 150–400)
RBC: 4.65 MIL/uL (ref 4.22–5.81)
RDW: 13.8 % (ref 11.5–15.5)
WBC: 8 10*3/uL (ref 4.0–10.5)

## 2014-11-26 NOTE — Patient Instructions (Signed)
A chest x-ray AT [facility] .takes a picture of the organs and structures inside the chest, including the heart, lungs, and blood vessels. This test can show several things, including, whether the heart is enlarges; whether fluid is building up in the lungs; and whether pacemaker / defibrillator leads are still in place.  Your physician recommends that you schedule a follow-up appointment in: [place], [place] THE OFFICE  Your physician has requested that you have a lexiscan myoview. For further information please visit [email or web address]. Please follow instruction sheet, as given.

## 2014-11-26 NOTE — Progress Notes (Signed)
Cardiology Office Note   Date:  11/26/2014   ID:  Riley Jones, DOB [birth year], MRN [id number]  PCP:  Aldine Contes, MD  Cardiologist:  Dr. Jenkins Rouge     Chief Complaint  Patient presents with  . Chest Pain  . Coronary Artery Disease     History of Present Illness: Riley Jones is a 59 y.o. male with a hx of CAD s/p DES to mid LAD in 06/2014, DM2, HL, HIV, COPD, tobacco abuse.  Last seen by Dr. Jenkins Rouge 08/2014.  He saw his PCP last week and c/o intermittent CP.  He is referred back for evaluation.  The patient tells me that he has had left-sided chest pressure for the last several months. Initially he tells me that the chest pressure never resolved after his PCI. However, after further questioning, he notes that he did have a couple of months without chest discomfort. These symptoms seem to have gotten worse recently. For the most part they're constant. He denies any worsening with exertion. He did take nitroglycerin on one occasion without any relief. He also has some right arm symptoms. He notes some discomfort as well as bruising in his forearm. He's also had some weakness and tremors. He has dyspnea with exertion. Overall, this is improved since his PCI. He is NYHA 2-2b. He denies PND or edema. He sleeps on 3 pillows chronically. He denies syncope.   Studies/Reports Reviewed Today:  Cardiac Cath/PCI 10/15 LM: Normal LAD:   40-50% proximal 80% mid at D2 take off shelf like lesion LCx: normal RCA: Normal PDA: 30% proximal EF 55-65%, there is no significant mitral regurgitation  PCI:  3.0 x 15 mm Xience alpine DES to Mid LAD  Myoview 9/13 Normal stress nuclear study.  No ischemia.  LV Ejection Fraction: 64%.   Past Medical History  Diagnosis Date  . Hypertension     Well Controlled off meds  . 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 Ola Spurr (ID)  . Hyperlipidemia   . Atypical angina     Myoview  2003, Normal EF 64%  . Foot pain 03/30/2011  . COPD (chronic obstructive pulmonary disease)     "mild" (07/17/2014)  . Asthma     "mild" (07/17/2014)  . Pneumonia 4-5 times  . Sleep apnea     does not wear mask (07/17/2014)  . Type II diabetes mellitus   . GERD (gastroesophageal reflux disease)   . H/O hiatal hernia   . Headache     "weekly" (06/2014)  . Rheumatoid arthritis(714.0)     Followed by Dr. Charlestine Night, off MTX and prdnisone since 2007  . Arthritis     "pretty much all over"  . History of gout   . Anxiety   . Depression   . Kidney stones     "passed them all"  . CAD (coronary artery disease)     a. 10/20 DES to 80% mid LAD    Past Surgical History  Procedure Laterality Date  . Uvulopalatopharyngoplasty (uppp)/tonsillectomy/septoplasty  1990's  . Coronary angioplasty with stent placement  07/17/2014    a. DES to 80% mid LAD  . Tonsillectomy and adenoidectomy  1990's  . Appendectomy  ~ 1981  . Carpal tunnel release Left 1990's  . Trigger finger release Left 1990's    2nd & 5th digits  . Hernia repair    . Umbilical hernia repair  1980's    w/mesh  . Laparoscopic  incisional / umbilical / ventral hernia repair  1990's    "it was a double; not inguinal"  . Left heart catheterization with coronary angiogram N/A 07/17/2014    Procedure: LEFT HEART CATHETERIZATION WITH CORONARY ANGIOGRAM;  Surgeon: [name], MD;  Location: [facility] CATH LAB;  Service: Cardiovascular;  Laterality: N/A;  . Percutaneous stent intervention  07/17/2014    Procedure: PERCUTANEOUS STENT INTERVENTION;  Surgeon: [name], MD;  Location[facility] CATH LAB;  Service: Cardiovascular;;     Current Outpatient Prescriptions  Medication Sig Dispense Refill  . ACCU-CHEK FASTCLIX LANCETS MISC Check BS 4 times a day 102 each 11  . albuterol (VENTOLIN HFA) 108 (90 BASE) MCG/ACT inhaler Inhale 1 puff into the lungs every 4 (four) hours as needed. for shortness of breath. Or may use every 6 hours as  needed.    . ALPRAZolam (XANAX) 0.5 MG tablet Take 1 tablet (0.5 mg total) by mouth at bedtime as needed. 30 tablet 5  . amLODipine (NORVASC) 10 MG tablet TAKE ONE TABLET BY MOUTH ONCE DAILY. 31 tablet 3  . aspirin 81 MG EC tablet Take 81 mg by mouth daily.      . B-D INS SYRINGE 0.5CC/30GX1/2" 30G X 1/2" 0.5 ML MISC     . Blood Glucose Monitoring Suppl (ACCU-CHEK NANO SMARTVIEW) W/DEVICE KIT Check blood sugars 4 times a day 1 kit 0  . efavirenz-emtricitabine-tenofovir (ATRIPLA) 600-200-300 MG per tablet Take 1 tablet by mouth daily. 30 tablet 5  . folic acid (FOLVITE) 1 MG tablet Take 1 tablet (1 mg total) by mouth daily. 100 tablet 3  . glucose blood (ACCU-CHEK SMARTVIEW) test strip Check BS 4 times a day 150 each 12  . hydrochlorothiazide (HYDRODIURIL) 25 MG tablet TAKE ONE TABLET BY MOUTH ONCE DAILY 30 tablet 2  . HYDROcodone-acetaminophen (NORCO) 7.5-325 MG per tablet Take 1 tablet by mouth every 6 (six) hours as needed for moderate pain. 60 tablet 0  . ibuprofen (ADVIL,MOTRIN) 200 MG tablet Take 400 mg by mouth every 6 (six) hours as needed for moderate pain.    Marland Kitchen insulin NPH Human (HUMULIN N) 100 UNIT/ML injection Inject 0.12 mLs (12 Units total) into the skin 2 (two) times daily before a meal. 10 mL 3  . lisinopril (PRINIVIL,ZESTRIL) 10 MG tablet Take 1 tablet (10 mg total) by mouth daily. 90 tablet 3  . loratadine (CLARITIN) 10 MG tablet Take 1 tablet (10 mg total) by mouth daily. (Patient taking differently: Take 10 mg by mouth daily as needed. ) 30 tablet 1  . metFORMIN (GLUCOPHAGE) 1000 MG tablet Take 1 tablet (1,000 mg total) by mouth 2 (two) times daily with a meal. 180 tablet 3  . Multiple Vitamin (MULTIVITAMIN WITH MINERALS) TABS Take 1 tablet by mouth daily.    . nitroGLYCERIN (NITROSTAT) 0.4 MG SL tablet Place 1 tablet (0.4 mg total) under the tongue every 5 (five) minutes as needed for chest pain. 25 tablet 3  . omeprazole (PRILOSEC) 20 MG capsule Take 1 capsule (20 mg total) by  mouth daily. 30 capsule prn  . prasugrel (EFFIENT) 10 MG TABS tablet Take 1 tablet (10 mg total) by mouth daily. 30 tablet 11  . pravastatin (PRAVACHOL) 40 MG tablet TAKE ONE TABLET BY MOUTH AT BEDTIME 90 tablet 3  . terazosin (HYTRIN) 2 MG capsule TAKE TWO CAPSULES BY MOUTH AT BEDTIME 60 capsule 3   No current facility-administered medications for this visit.    Allergies:   Penicillins; Zoloft; and Chantix  Social History:  The patient  reports that he has been smoking Cigarettes.  He has a 46 pack-year smoking history. He has never used smokeless tobacco. He reports that he drinks about 0.6 oz of alcohol per week. He reports that he does not use illicit drugs.   Family History:  The patient's family history includes Heart attack in his brother and father; Heart disease in his father; Osteoporosis in his mother; Stroke in his maternal grandfather, maternal grandmother, paternal grandfather, and paternal grandmother.    ROS:   Please see the history of present illness.   Review of Systems  Constitution: Positive for chills and malaise/fatigue.  Cardiovascular: Positive for chest pain.  Hematologic/Lymphatic: Positive for bleeding problem. Bruises/bleeds easily.  Musculoskeletal: Positive for back pain, joint pain, joint swelling and myalgias.  Neurological: Positive for loss of balance.  Psychiatric/Behavioral: Positive for depression.  All other systems reviewed and are negative.     PHYSICAL EXAM: VS:  BP 140/80 mmHg  Pulse 64  Ht _0  (1.778 m)  Wt 197 lb (89.359 kg)  BMI 28.27 kg/m2    Wt Readings from Last 3 Encounters:  11/26/14 197 lb (89.359 kg)  11/22/14 199 lb 3.2 oz (90.357 kg)  09/05/14 200 lb (90.719 kg)     GEN: Well nourished, well developed, in no acute distress HEENT: normal Neck: no JVD, no masses Cardiac:  Normal S1/S2, RRR; no murmur, no rubs or gallops, no edema  Chest:  There is some tend to palp over L chest  Respiratory:  clear to  auscultation bilaterally, no wheezing, rhonchi or rales. GI: soft, nontender, nondistended, + BS MS: no deformity or atrophy Skin: warm and dry  Neuro:  CNs II-XII intact, Strength and sensation are intact Psych: Normal affect   EKG:  EKG is ordered today.  It demonstrates:   NSR, HR 64, normal axis, T-wave inversions in V5-V6, no change from prior tracing   Recent Labs: 12/28/2013: Pro B Natriuretic peptide (BNP) 26.86 05/30/2014: ALT 54* 08/08/2014: Hemoglobin 15.6; Platelets 202.0 11/22/2014: BUN 13; Creatinine 0.82; Potassium 4.1; Sodium 136    Lipid Panel    Component Value Date/Time   CHOL 157 05/30/2014 0906   TRIG 159* 05/30/2014 0906   HDL 43 05/30/2014 0906   CHOLHDL 3.7 05/30/2014 0906   VLDL 32 05/30/2014 0906   LDLCALC 82 05/30/2014 0906      ASSESSMENT AND PLAN:  1.  Chest pain: Symptoms with typical and atypical features. His ECG is unchanged.  He denies exertional symptoms.  Overall, his symptoms are improved since his PCI. He continues to smoke. He does have some point tenderness with palpation and does have rheumatoid arthritis. Question if he could have musculoskeletal chest pain given some of his symptoms.  I reviewed his case with Dr. Jenkins Rouge.    -  Arrange ETT-Myoview to rule out ischemia.     -  Arrange CXR. 2.  CAD:  Arrange myoview as noted.  Continue aspirin, Effient, amlodipine, pravastatin, lisinopril. 3.  HTN:  BP borderline.  I suspect he has been kept off of beta blockers due to bradycardia.  Consider adding Toprol at FU or increasing Hytrin if BP remains above target.   4.  Hyperlipidemia:  Continue statin.  5.  COPD:  FU with PCP.   6.  Tobacco Abuse:  He is starting a program to quit.    Current medicines are reviewed at length with the patient today.  The patient does not have concerns regarding  medicines.  The following changes have been made:  As above.   Labs/ tests ordered today include:  Orders Placed This Encounter  Procedures  .  DG Chest 2 View  . Myocardial Perfusion Imaging  . EKG 12-Lead    Disposition:   FU with Dr. Jenkins Rouge or me in 2 weeks   Signed, Versie Starks, MHS 11/26/2014 3:31 PM    [address], [place], [place]  [address] Phone: [phone]; Fax: [phone]

## 2014-11-27 LAB — COMPREHENSIVE METABOLIC PANEL
ALBUMIN: 4.5 g/dL (ref 3.5–5.2)
ALT: 46 U/L (ref 0–53)
AST: 28 U/L (ref 0–37)
Alkaline Phosphatase: 80 U/L (ref 39–117)
BUN: 10 mg/dL (ref 6–23)
CHLORIDE: 98 meq/L (ref 96–112)
CO2: 25 mEq/L (ref 19–32)
CREATININE: 0.84 mg/dL (ref 0.50–1.35)
Calcium: 10 mg/dL (ref 8.4–10.5)
GLUCOSE: 134 mg/dL — AB (ref 70–99)
POTASSIUM: 4.1 meq/L (ref 3.5–5.3)
Sodium: 137 mEq/L (ref 135–145)
Total Bilirubin: 0.6 mg/dL (ref 0.2–1.2)
Total Protein: 7.2 g/dL (ref 6.0–8.3)

## 2014-11-27 LAB — LIPID PANEL
Cholesterol: 178 mg/dL (ref 0–200)
HDL: 47 mg/dL (ref >=40)
LDL Cholesterol: 85 mg/dL (ref 0–99)
TRIGLYCERIDES: 231 mg/dL — AB (ref <150)
Total CHOL/HDL Ratio: 3.8 Ratio
VLDL: 46 mg/dL — AB (ref 0–40)

## 2014-11-27 LAB — RPR

## 2014-11-28 ENCOUNTER — Telehealth: Payer: Self-pay | Admitting: Cardiovascular Disease

## 2014-11-28 ENCOUNTER — Other Ambulatory Visit: Payer: Commercial Managed Care - HMO

## 2014-11-28 LAB — HIV-1 RNA QUANT-NO REFLEX-BLD
HIV 1 RNA Quant: <20 copies/mL (ref <20)
HIV-1 RNA Quant, Log: <1.3 {Log} (ref <1.30)

## 2014-11-28 LAB — T-HELPER CELL (CD4) - (RCID CLINIC ONLY)
CD4 T CELL ABS: 1060 /uL (ref 400–2700)
CD4 T CELL HELPER: 43 % (ref 33–55)

## 2014-11-28 NOTE — Telephone Encounter (Signed)
Pt states he was returning call about CXR results done 11/26/14. Pt given CXR results from 11/26/14.

## 2014-11-28 NOTE — Telephone Encounter (Signed)
New Msg       Pt returning call about lab results.    Please call back.

## 2014-12-03 ENCOUNTER — Other Ambulatory Visit: Payer: Commercial Managed Care - HMO

## 2014-12-06 ENCOUNTER — Ambulatory Visit (HOSPITAL_COMMUNITY): Payer: Commercial Managed Care - HMO | Attending: Cardiology | Admitting: Radiology

## 2014-12-06 DIAGNOSIS — E119 Type 2 diabetes mellitus without complications: Secondary | ICD-10-CM | POA: Diagnosis not present

## 2014-12-06 DIAGNOSIS — R0609 Other forms of dyspnea: Secondary | ICD-10-CM | POA: Insufficient documentation

## 2014-12-06 DIAGNOSIS — R079 Chest pain, unspecified: Secondary | ICD-10-CM | POA: Insufficient documentation

## 2014-12-06 DIAGNOSIS — I2510 Atherosclerotic heart disease of native coronary artery without angina pectoris: Secondary | ICD-10-CM

## 2014-12-06 DIAGNOSIS — Z794 Long term (current) use of insulin: Secondary | ICD-10-CM | POA: Insufficient documentation

## 2014-12-06 DIAGNOSIS — I10 Essential (primary) hypertension: Secondary | ICD-10-CM | POA: Diagnosis not present

## 2014-12-06 MED ORDER — TECHNETIUM TC 99M SESTAMIBI GENERIC - CARDIOLITE
11.0000 | Freq: Once | INTRAVENOUS | Status: AC | PRN
  Administered 2014-12-06: 11 via INTRAVENOUS

## 2014-12-06 MED ORDER — TECHNETIUM TC 99M SESTAMIBI GENERIC - CARDIOLITE
33.0000 | Freq: Once | INTRAVENOUS | Status: AC | PRN
  Administered 2014-12-06: 33 via INTRAVENOUS

## 2014-12-06 NOTE — Progress Notes (Signed)
[facility] NUCLEAR [facility] [address] [place], [place] [address] [phone]    Cardiology Nuclear Med Study  Riley Jones is a 59 y.o. male     MRN : [id number]     DOB: 06-Apr-1956  Procedure Date: 12/06/2014  Nuclear Med Background Indication for Stress Test:  Evaluation for Ischemia and Stent Patency History:  Asthma, COPD and CAD-Stent, 2013 MPI NL Cardiac Risk Factors: Hypertension and IDDM   Symptoms:  Chest Pain and DOE   Nuclear Pre-Procedure Caffeine/Decaff Intake:  None> 12 hrs NPO After: 9:30pm   Lungs:  clear O2 Sat: 98% on room air. IV 0.9% NS with Angio Cath:  22g  IV Site: R Hand x 1, tolerated well IV Started by:  Irven Baltimore, RN  Chest Size (in):  44 Cup Size: n/a  Height: 5\' 10"  (1.778 m)  Weight:  196 lb (88.905 kg)  BMI:  Body mass index is 28.12 kg/(m^2). Tech Comments:  1/2 dose Humulin N  Insulin last night. Fasting CBG was 244 at 4:00am today. Patient took Metformin, but no insulin today. Irven Baltimore, RN.    Nuclear Med Study 1 or 2 day study: 1 day  Stress Test Type:  Stress  Reading MD: N/A  Order Authorizing Provider:  Jenkins Rouge, MD, and Richardson Dopp, PAC  Resting Radionuclide: Technetium 78m Sestamibi  Resting Radionuclide Dose: 11.0 mCi   Stress Radionuclide:  Technetium 6m Sestamibi  Stress Radionuclide Dose: 33.0 mCi           Stress Protocol Rest HR: 57 Stress HR: 146  Rest BP: 115/62 Stress BP: 164/67  Exercise Time (min): 10:15 METS: 12.10   Predicted Max HR: 160 bpm % Max HR: 91.25 bpm Rate Pressure Product: 63016   Dose of Adenosine (mg):  n/a Dose of Lexiscan: n/a mg  Dose of Atropine (mg): n/a Dose of Dobutamine: n/a mcg/kg/min (at max HR)  Stress Test Technologist: Perrin Maltese, EMT-P  Nuclear Technologist:  Earl Many, CNMT     Rest Procedure:  Myocardial perfusion imaging was performed at rest 45 minutes following the intravenous administration of Technetium 19m Sestamibi. Rest ECG: NSR  with non-specific ST-T wave changes  Stress Procedure:  The patient exercised on the treadmill utilizing the Bruce Protocol for 10:15 minutes. The patient stopped due to dizzy, sob, and denied any chest pain.  Technetium 76m Sestamibi was injected at peak exercise and myocardial perfusion imaging was performed after a brief delay. Stress ECG: No significant change from baseline ECG  QPS Raw Data Images:  Normal; no motion artifact; normal heart/lung ratio. Stress Images:  Normal homogeneous uptake in all areas of the myocardium. Rest Images:  Normal homogeneous uptake in all areas of the myocardium. Subtraction (SDS):  No evidence of ischemia. Transient Ischemic Dilatation (Normal <1.22):  1.05 Lung/Heart Ratio (Normal <0.45):  0.43  Quantitative Gated Spect Images QGS EDV:  108 ml QGS ESV:  46 ml  Impression Exercise Capacity:  Excellent exercise capacity. BP Response:  Normal blood pressure response. Clinical Symptoms:  No significant symptoms noted. ECG Impression:  No significant ST segment change suggestive of ischemia. Comparison with Prior Nuclear Study: No significant change from previous study 06/20/12.    Overall Impression:  Normal stress nuclear study.   No evidence of ischemia.   LV Ejection Fraction: 57%.  LV Wall Motion:  NL LV Function; NL Wall Motion.   Thayer Headings, Brooke Bonito., MD, [facility] 12/06/2014, 3:22 PM [address],  [employer]  [place] Pager [phone]

## 2014-12-07 ENCOUNTER — Telehealth: Payer: Self-pay | Admitting: *Deleted

## 2014-12-07 ENCOUNTER — Encounter: Payer: Self-pay | Admitting: Physician Assistant

## 2014-12-07 NOTE — Telephone Encounter (Signed)
pt notified about normal myoview with verbal understanding. Confirmed appt 3/14 8:50 w/Scott W. PA.

## 2014-12-10 ENCOUNTER — Ambulatory Visit (INDEPENDENT_AMBULATORY_CARE_PROVIDER_SITE_OTHER): Payer: Commercial Managed Care - HMO | Admitting: Physician Assistant

## 2014-12-10 ENCOUNTER — Encounter: Payer: Self-pay | Admitting: Physician Assistant

## 2014-12-10 VITALS — BP 125/70 | HR 70 | Ht 70.0 in | Wt 199.8 lb

## 2014-12-10 DIAGNOSIS — Z720 Tobacco use: Secondary | ICD-10-CM | POA: Diagnosis not present

## 2014-12-10 DIAGNOSIS — E785 Hyperlipidemia, unspecified: Secondary | ICD-10-CM | POA: Diagnosis not present

## 2014-12-10 DIAGNOSIS — I2510 Atherosclerotic heart disease of native coronary artery without angina pectoris: Secondary | ICD-10-CM | POA: Diagnosis not present

## 2014-12-10 DIAGNOSIS — F17200 Nicotine dependence, unspecified, uncomplicated: Secondary | ICD-10-CM

## 2014-12-10 DIAGNOSIS — I10 Essential (primary) hypertension: Secondary | ICD-10-CM

## 2014-12-10 NOTE — Progress Notes (Signed)
Cardiology Office Note   Date:  12/10/2014   ID:  Riley Jones, DOB 1955/10/02, MRN [id number]  PCP:  Aldine Contes, MD  Cardiologist:  Dr. Jenkins Rouge     Chief Complaint  Patient presents with  . Coronary Artery Disease    FU on chest pain and recent stress test     History of Present Illness: Riley Jones is a 59 y.o. male with a hx of CAD s/p DES to mid LAD in 06/2014, DM2, HL, HIV, COPD, tobacco abuse.  He saw his PCP recently and c/o intermittent CP and was referred back for evaluation.  He described left-sided chest pressure for the last several months.  I arranged an ETT-Myoview.  He exercised for 10" without ECG changes and no ischemia on nuclear imaging.  CXR was unremarkable.  He returns for FU.  He is doing well.  The patient denies chest pain, shortness of breath, syncope, orthopnea, PND or significant pedal edema.    Studies/Reports Reviewed Today:  Myoview 12/07/14 Normal stress nuclear study. No evidence of ischemia.LVEF: 57%.  CXR 11/26/14 IMPRESSION:  No active cardiopulmonary disease. Stable chronic bronchitic changes. Degenerative changes thoracic spine.  Cardiac Cath/PCI 10/15 LM: Normal LAD:   40-50% proximal 80% mid at D2 take off shelf like lesion LCx: normal RCA: Normal PDA: 30% proximal EF 55-65%, there is no significant mitral regurgitation  PCI:  3.0 x 15 mm Xience alpine DES to Mid LAD  Myoview 9/13 Normal stress nuclear study.  No ischemia.  LV Ejection Fraction: 64%.   Past Medical History  Diagnosis Date  . Hypertension     Well Controlled off meds  . 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 Ola Spurr ([place])  . Hyperlipidemia   . Atypical angina     Myoview 2003, Normal EF 64%  . Foot pain 03/30/2011  . COPD (chronic obstructive pulmonary disease)     "mild" (07/17/2014)  . Asthma     "mild" (07/17/2014)  . Pneumonia 4-5 times  . Sleep apnea     does not wear mask  (07/17/2014)  . Type II diabetes mellitus   . GERD (gastroesophageal reflux disease)   . H/O hiatal hernia   . Headache     "weekly" (06/2014)  . Rheumatoid arthritis(714.0)     Followed by Dr. Charlestine Night, off MTX and prdnisone since 2007  . Arthritis     "pretty much all over"  . History of gout   . Anxiety   . Depression   . Kidney stones     "passed them all"  . CAD (coronary artery disease)     a. 10/20 DES to 80% mid LAD  . Hx of cardiovascular stress test     ETT-Myoview (3/16):  Ex 10:15, No Ischemia, EF 57%; Normal Study    Past Surgical History  Procedure Laterality Date  . Uvulopalatopharyngoplasty (uppp)/tonsillectomy/septoplasty  1990's  . Coronary angioplasty with stent placement  07/17/2014    a. DES to 80% mid LAD  . Tonsillectomy and adenoidectomy  1990's  . Appendectomy  ~ 1981  . Carpal tunnel release Left 1990's  . Trigger finger release Left 1990's    2nd & 5th digits  . Hernia repair    . Umbilical hernia repair  1980's    w/mesh  . Laparoscopic incisional / umbilical / ventral hernia repair  1990's    "it was a double; not inguinal"  . Left heart catheterization  with coronary angiogram N/A 07/17/2014    Procedure: LEFT HEART CATHETERIZATION WITH CORONARY ANGIOGRAM;  Surgeon: [name], MD;  Location: [facility] CATH LAB;  Service: Cardiovascular;  Laterality: N/A;  . Percutaneous stent intervention  07/17/2014    Procedure: PERCUTANEOUS STENT INTERVENTION;  Surgeon: [name], MD;  Location: [facility] CATH LAB;  Service: Cardiovascular;;     Current Outpatient Prescriptions  Medication Sig Dispense Refill  . ACCU-CHEK FASTCLIX LANCETS MISC Check BS 4 times a day 102 each 11  . albuterol (VENTOLIN HFA) 108 (90 BASE) MCG/ACT inhaler Inhale 1 puff into the lungs every 4 (four) hours as needed. for shortness of breath. Or may use every 6 hours as needed.    . ALPRAZolam (XANAX) 0.5 MG tablet Take 1 tablet (0.5 mg total) by mouth at bedtime as needed. 30 tablet  5  . amLODipine (NORVASC) 10 MG tablet TAKE ONE TABLET BY MOUTH ONCE DAILY. 31 tablet 3  . aspirin 81 MG EC tablet Take 81 mg by mouth daily.      . B-D INS SYRINGE 0.5CC/30GX1/2" 30G X 1/2" 0.5 ML MISC     . Blood Glucose Monitoring Suppl (ACCU-CHEK NANO SMARTVIEW) W/DEVICE KIT Check blood sugars 4 times a day 1 kit 0  . efavirenz-emtricitabine-tenofovir (ATRIPLA) 600-200-300 MG per tablet Take 1 tablet by mouth daily. 30 tablet 5  . folic acid (FOLVITE) 1 MG tablet Take 1 tablet (1 mg total) by mouth daily. 100 tablet 3  . glucose blood (ACCU-CHEK SMARTVIEW) test strip Check BS 4 times a day 150 each 12  . hydrochlorothiazide (HYDRODIURIL) 25 MG tablet TAKE ONE TABLET BY MOUTH ONCE DAILY 30 tablet 2  . HYDROcodone-acetaminophen (NORCO) 7.5-325 MG per tablet Take 1 tablet by mouth every 6 (six) hours as needed for moderate pain. 60 tablet 0  . ibuprofen (ADVIL,MOTRIN) 200 MG tablet Take 400 mg by mouth every 6 (six) hours as needed for moderate pain.    Marland Kitchen insulin NPH Human (HUMULIN N) 100 UNIT/ML injection Inject 0.12 mLs (12 Units total) into the skin 2 (two) times daily before a meal. 10 mL 3  . lisinopril (PRINIVIL,ZESTRIL) 10 MG tablet Take 1 tablet (10 mg total) by mouth daily. 90 tablet 3  . loratadine (CLARITIN) 10 MG tablet Take 1 tablet (10 mg total) by mouth daily. (Patient taking differently: Take 10 mg by mouth daily as needed. ) 30 tablet 1  . metFORMIN (GLUCOPHAGE) 1000 MG tablet Take 1 tablet (1,000 mg total) by mouth 2 (two) times daily with a meal. 180 tablet 3  . Multiple Vitamin (MULTIVITAMIN WITH MINERALS) TABS Take 1 tablet by mouth daily.    . nitroGLYCERIN (NITROSTAT) 0.4 MG SL tablet Place 1 tablet (0.4 mg total) under the tongue every 5 (five) minutes as needed for chest pain. 25 tablet 3  . omeprazole (PRILOSEC) 20 MG capsule Take 1 capsule (20 mg total) by mouth daily. 30 capsule prn  . prasugrel (EFFIENT) 10 MG TABS tablet Take 1 tablet (10 mg total) by mouth daily. 30  tablet 11  . pravastatin (PRAVACHOL) 40 MG tablet TAKE ONE TABLET BY MOUTH AT BEDTIME 90 tablet 3  . terazosin (HYTRIN) 2 MG capsule TAKE TWO CAPSULES BY MOUTH AT BEDTIME 60 capsule 3   No current facility-administered medications for this visit.    Allergies:   Penicillins; Zoloft; and Chantix    Social History:  The patient  reports that he has been smoking Cigarettes.  He has a 46 pack-year smoking  history. He has never used smokeless tobacco. He reports that he drinks about 0.6 oz of alcohol per week. He reports that he does not use illicit drugs.   Family History:  The patient's family history includes Heart attack in his brother and father; Heart disease in his father; Osteoporosis in his mother; Stroke in his maternal grandfather, maternal grandmother, paternal grandfather, and paternal grandmother.    ROS:   Please see the history of present illness.   Review of Systems  Genitourinary:       Erectile Dysfunction  All other systems reviewed and are negative.    PHYSICAL EXAM: VS:  BP 125/70 mmHg  Pulse 70  Ht $R'5\' 10"'Er$  (1.778 m)  Wt 199 lb 12.8 oz (90.629 kg)  BMI 28.67 kg/m2  SpO2 97%    Wt Readings from Last 3 Encounters:  12/10/14 199 lb 12.8 oz (90.629 kg)  12/06/14 196 lb (88.905 kg)  11/26/14 197 lb (89.359 kg)     GEN: Well nourished, well developed, in no acute distress HEENT: normal Neck: no JVD, no masses Cardiac:  Normal S1/S2, RRR; no murmur, no rubs or gallops, no edema  Respiratory:  clear to auscultation bilaterally, no wheezing, rhonchi or rales. GI: soft, nontender, nondistended, + BS MS: no deformity or atrophy Skin: warm and dry  Neuro:  CNs II-XII intact, Strength and sensation are intact Psych: Normal affect   EKG:  EKG is not ordered today.  It demonstrates:   n/a  Recent Labs: 12/28/2013: Pro B Natriuretic peptide (BNP) 26.86 11/26/2014: ALT 46; BUN 10; Creatinine 0.84; Hemoglobin 15.3; Platelets 190; Potassium 4.1; Sodium 137    Lipid  Panel    Component Value Date/Time   CHOL 178 11/26/2014 1701   TRIG 231* 11/26/2014 1701   HDL 47 11/26/2014 1701   CHOLHDL 3.8 11/26/2014 1701   VLDL 46* 11/26/2014 1701   LDLCALC 85 11/26/2014 1701      ASSESSMENT AND PLAN:  1.  Chest pain:  Resolved.  He thinks it was related to anxiety or his eating habits.  No further recurrence.  Recent Myoview low risk.   2.  CAD:  Recent myoview low risk.  No further CP.  Continue aspirin, Effient, amlodipine, pravastatin, lisinopril. 3.  HTN:  Controlled.  4.  Hyperlipidemia:  Continue statin. Recent LDL 86.  We discussed working on diet.  Continue current dose of statin.  5.  COPD:  FU with PCP.   6.  Tobacco Abuse:  He is trying to quit.   Current medicines are reviewed at length with the patient today.  The patient does not have concerns regarding medicines.  The following changes have been made: None.   Labs/ tests ordered today include:  No orders of the defined types were placed in this encounter.    Disposition:   FU with Dr. Jenkins Rouge June 2016.   Signed, Versie Starks, MHS 12/10/2014 9:25 AM    [facility] [place], [place], [place]  [address] Phone: [phone]; Fax: [phone]

## 2014-12-10 NOTE — Patient Instructions (Signed)
No changes in your medications today.  Follow up with Dr. Jenkins Rouge in June 2016 as planned.  You will received a card in the mail to schedule this appointment.

## 2014-12-12 ENCOUNTER — Other Ambulatory Visit: Payer: Self-pay | Admitting: Internal Medicine

## 2014-12-12 ENCOUNTER — Ambulatory Visit: Payer: Commercial Managed Care - HMO | Admitting: Infectious Diseases

## 2014-12-14 ENCOUNTER — Encounter: Payer: Self-pay | Admitting: Infectious Diseases

## 2014-12-14 ENCOUNTER — Ambulatory Visit (INDEPENDENT_AMBULATORY_CARE_PROVIDER_SITE_OTHER): Payer: Commercial Managed Care - HMO | Admitting: Infectious Diseases

## 2014-12-14 DIAGNOSIS — M069 Rheumatoid arthritis, unspecified: Secondary | ICD-10-CM | POA: Diagnosis not present

## 2014-12-14 DIAGNOSIS — Z720 Tobacco use: Secondary | ICD-10-CM

## 2014-12-14 DIAGNOSIS — E11610 Type 2 diabetes mellitus with diabetic neuropathic arthropathy: Secondary | ICD-10-CM

## 2014-12-14 DIAGNOSIS — F17200 Nicotine dependence, unspecified, uncomplicated: Secondary | ICD-10-CM

## 2014-12-14 DIAGNOSIS — B20 Human immunodeficiency virus [HIV] disease: Secondary | ICD-10-CM

## 2014-12-14 NOTE — Assessment & Plan Note (Addendum)
Greatly appreciate IM f/u.  Will get ophtho exam.

## 2014-12-14 NOTE — Assessment & Plan Note (Signed)
Encouraged him to quit 

## 2014-12-14 NOTE — Assessment & Plan Note (Addendum)
He is doing very well. vax are up to date. Hep B immune.  Offered/refused condoms.  He may call back for ED rx.  Will see him back in 6 months.

## 2014-12-14 NOTE — Progress Notes (Signed)
   Subjective:    Patient ID: Riley Jones, male    DOB: 1956-08-08, 59 y.o.   MRN: [id number]  HPI 59 yo M with hx of HIV+ (~51yrs), DM2 (>80yrs, on insulin) and RA (took MTX without relief, off prednisone). He has been maintained on atripla (has been on multiple previous meds).  Also hx of smoking. CAD s/p DES to mid LAD in 06/2014, Had repeat stress last week that went well.  Has been trying to quit smoking.  Still having RA pain.  No problems with atripla.  HIV 1 RNA QUANT (copies/mL)  Date Value  11/26/2014 <20  05/30/2014 <20  11/20/2013 <20   CD4 T CELL ABS (/uL)  Date Value  11/26/2014 1060  05/30/2014 760  11/20/2013 840   Lab Results  Component Value Date   CHOL 178 11/26/2014   HDL 47 11/26/2014   LDLCALC 85 11/26/2014   TRIG 231* 11/26/2014   CHOLHDL 3.8 11/26/2014     Review of Systems Has occas PC, attribute to stress, GERD. A1C has gone to under 8 from over 10. Still having some issues with depression. Hasn't been going to exercise. Needs ophtho exam this year.  LE numbness.  Easy bruising.     Objective:   Physical Exam  Constitutional: He appears well-developed and well-nourished.  HENT:  Mouth/Throat: No oropharyngeal exudate.  Eyes: EOM are normal. Pupils are equal, round, and reactive to light.  Neck: Neck supple.  Cardiovascular: Normal rate, regular rhythm and normal heart sounds.   Pulmonary/Chest: Effort normal and breath sounds normal.  Abdominal: Soft. Bowel sounds are normal. He exhibits no distension. There is no tenderness.  Musculoskeletal:  No pedal lesions.   Lymphadenopathy:    He has no cervical adenopathy.       Assessment & Plan:

## 2014-12-14 NOTE — Assessment & Plan Note (Signed)
Greatly appreciate his PCP f/u.

## 2014-12-18 ENCOUNTER — Other Ambulatory Visit: Payer: Self-pay | Admitting: Internal Medicine

## 2014-12-18 DIAGNOSIS — Z79891 Long term (current) use of opiate analgesic: Secondary | ICD-10-CM

## 2014-12-19 ENCOUNTER — Other Ambulatory Visit: Payer: Commercial Managed Care - HMO

## 2014-12-19 ENCOUNTER — Encounter: Payer: Self-pay | Admitting: *Deleted

## 2014-12-19 ENCOUNTER — Other Ambulatory Visit: Payer: Self-pay | Admitting: *Deleted

## 2014-12-19 DIAGNOSIS — Z79891 Long term (current) use of opiate analgesic: Secondary | ICD-10-CM

## 2014-12-19 DIAGNOSIS — Z79899 Other long term (current) drug therapy: Secondary | ICD-10-CM | POA: Diagnosis not present

## 2014-12-19 MED ORDER — HYDROCODONE-ACETAMINOPHEN 7.5-325 MG PO TABS: 1.0000 | ORAL_TABLET | Freq: Four times a day (QID) | ORAL | Status: DC | PRN

## 2014-12-19 NOTE — Progress Notes (Signed)
Pt came in to pick up narcotic Rx. UDS obtained and sent to lab. Pt states he is not sure if her had pain med today but he did have yesterday.

## 2014-12-19 NOTE — Progress Notes (Signed)
Thank you. I will follow it up

## 2014-12-19 NOTE — Telephone Encounter (Signed)
Pt informed Rx is ready 

## 2014-12-20 LAB — PRESCRIPTION ABUSE MONITORING 15P, URINE
Amphetamine/Meth: NEGATIVE ng/mL
Barbiturate Screen, Urine: NEGATIVE ng/mL
Benzodiazepine Screen, Urine: NEGATIVE ng/mL
Buprenorphine, Urine: NEGATIVE ng/mL
CANNABINOID SCRN UR: NEGATIVE ng/mL
CARISOPRODOL, URINE: NEGATIVE ng/mL
Cocaine Metabolites: NEGATIVE ng/mL
Creatinine, Urine: 85.07 mg/dL (ref >20.0)
Fentanyl, Ur: NEGATIVE ng/mL
Meperidine, Ur: NEGATIVE ng/mL
Methadone Screen, Urine: NEGATIVE ng/mL
OXYCODONE SCRN UR: NEGATIVE ng/mL
Propoxyphene: NEGATIVE ng/mL
Tramadol Scrn, Ur: NEGATIVE ng/mL
Zolpidem, Urine: NEGATIVE ng/mL

## 2014-12-24 LAB — OPIATES/OPIOIDS (LC/MS-MS)
Codeine Urine: NEGATIVE ng/mL (ref <50)
Hydrocodone: 75 ng/mL — ABNORMAL HIGH (ref <50)
Hydromorphone: NEGATIVE ng/mL (ref <50)
MORPHINE: NEGATIVE ng/mL (ref <50)
NOROXYCODONE, UR: NEGATIVE ng/mL (ref <50)
Norhydrocodone, Ur: 248 ng/mL — ABNORMAL HIGH (ref <50)
OXYCODONE, UR: NEGATIVE ng/mL (ref <50)
Oxymorphone: NEGATIVE ng/mL (ref <50)

## 2014-12-25 DIAGNOSIS — H66002 Acute suppurative otitis media without spontaneous rupture of ear drum, left ear: Secondary | ICD-10-CM | POA: Diagnosis not present

## 2014-12-30 DIAGNOSIS — K047 Periapical abscess without sinus: Secondary | ICD-10-CM | POA: Diagnosis not present

## 2014-12-30 DIAGNOSIS — K089 Disorder of teeth and supporting structures, unspecified: Secondary | ICD-10-CM | POA: Diagnosis not present

## 2015-01-03 ENCOUNTER — Encounter: Payer: Self-pay | Admitting: Internal Medicine

## 2015-01-10 ENCOUNTER — Telehealth: Payer: Self-pay | Admitting: *Deleted

## 2015-01-10 MED ORDER — CLOPIDOGREL BISULFATE 75 MG PO TABS: 75.0000 mg | ORAL_TABLET | Freq: Every day | ORAL | Status: DC

## 2015-01-10 NOTE — Telephone Encounter (Signed)
NOTED ./CY 

## 2015-01-10 NOTE — Telephone Encounter (Signed)
Patient returned your call and he is aware that plavix was ok by Dr Johnsie Cancel and that rx is at [facility].

## 2015-01-10 NOTE — Telephone Encounter (Signed)
LM TO CALL BACK GEN PLAVIX  SCRIPT SENT  VIA  EPIC  TO  [facility] ON [address] .Adonis Housekeeper

## 2015-01-10 NOTE — Telephone Encounter (Signed)
Ok to change to generic plavix 75 mg

## 2015-01-10 NOTE — Telephone Encounter (Signed)
PT  WOULD  LIKE  TO  CHANGE  EFFIENT  TO  PLAVIX  DUE  TO COST PER PHARMACY  PT  PAID  $160.34  IN MARCH  PRICE  KEEPS INCREASING  SINCE  JAN  WHICH ONLY PAID  $47.00  WILL FORWARD TO DR Johnsie Cancel  FOR  REVIEW .Adonis Housekeeper

## 2015-01-11 ENCOUNTER — Other Ambulatory Visit: Payer: Self-pay | Admitting: Internal Medicine

## 2015-01-16 ENCOUNTER — Other Ambulatory Visit: Payer: Self-pay | Admitting: Infectious Diseases

## 2015-01-19 ENCOUNTER — Other Ambulatory Visit: Payer: Self-pay | Admitting: Internal Medicine

## 2015-01-21 ENCOUNTER — Other Ambulatory Visit: Payer: Self-pay | Admitting: *Deleted

## 2015-01-21 MED ORDER — HYDROCODONE-ACETAMINOPHEN 7.5-325 MG PO TABS: 1.0000 | ORAL_TABLET | Freq: Four times a day (QID) | ORAL | Status: DC | PRN

## 2015-01-21 NOTE — Telephone Encounter (Signed)
Pt aware.

## 2015-02-14 ENCOUNTER — Other Ambulatory Visit: Payer: Self-pay | Admitting: Internal Medicine

## 2015-02-14 MED ORDER — HYDROCODONE-ACETAMINOPHEN 7.5-325 MG PO TABS: 1.0000 | ORAL_TABLET | Freq: Four times a day (QID) | ORAL | Status: DC | PRN

## 2015-02-14 NOTE — Telephone Encounter (Signed)
Pt informed

## 2015-02-14 NOTE — Telephone Encounter (Signed)
[place] database - got 10 hydrocodone from another non [facility] provider in April  UDS - appropriate for hydrocodone but didn't have alprazolam.   Will Rx 2 months and requests PCP appt in June if possible.

## 2015-02-15 ENCOUNTER — Other Ambulatory Visit: Payer: Self-pay | Admitting: Internal Medicine

## 2015-02-15 NOTE — Telephone Encounter (Signed)
Needs appt with PCP - HTN F/U - when next appt is open.

## 2015-03-11 NOTE — Progress Notes (Signed)
Patient ID: Riley Jones, male   DOB: 1956-06-22, 59 y.o.   MRN: [id number]    Cardiology Office Note   Date:  03/13/2015   ID:  Riley Jones, DOB Mar 06, 1956, MRN [id number]  PCP:  Aldine Contes, MD  Cardiologist:  Dr. Jenkins Rouge     No chief complaint on file.    History of Present Illness: Riley Jones is a 59 y.o. male with a hx of CAD s/p DES to mid LAD in 06/2014, DM2, HL, HIV, COPD, tobacco abuse.  Some atypical chest Pain in March related to anxiety and food.  F/U myovue reviewed and no ischemia   Studies/Reports Reviewed Today:  Myoview 12/07/14 Normal stress nuclear study. No evidence of ischemia.LVEF: 57%.  CXR 11/26/14 IMPRESSION:  No active cardiopulmonary disease. Stable chronic bronchitic changes. Degenerative changes thoracic spine.  Cardiac Cath/PCI 10/15 LM: Normal LAD:   40-50% proximal 80% mid at D2 take off shelf like lesion LCx: normal RCA: Normal PDA: 30% proximal EF 55-65%, there is no significant mitral regurgitation  PCI:  3.0 x 15 mm Xience alpine DES to Mid LAD  Lab Results  Component Value Date   LDLCALC 85 11/26/2014   Lab Results  Component Value Date   HGBA1C 7.3 11/22/2014   But use to be 10.  Still smoking PPD.  Counseled for less than 10 minutes.  Left sided muscular neck pain     Past Medical History  Diagnosis Date  . Hypertension     Well Controlled off meds  . 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 Ola Spurr ([place])  . Hyperlipidemia   . Atypical angina     Myoview 2003, Normal EF 64%  . Foot pain 03/30/2011  . COPD (chronic obstructive pulmonary disease)     "mild" (07/17/2014)  . Asthma     "mild" (07/17/2014)  . Pneumonia 4-5 times  . Sleep apnea     does not wear mask (07/17/2014)  . Type II diabetes mellitus   . GERD (gastroesophageal reflux disease)   . H/O hiatal hernia   . Headache     "weekly" (06/2014)  . Rheumatoid arthritis(714.0)    Followed by Dr. Charlestine Night, off MTX and prdnisone since 2007  . Arthritis     "pretty much all over"  . History of gout   . Anxiety   . Depression   . Kidney stones     "passed them all"  . CAD (coronary artery disease)     a. 10/20 DES to 80% mid LAD  . Hx of cardiovascular stress test     ETT-Myoview (3/16):  Ex 10:15, No Ischemia, EF 57%; Normal Study    Past Surgical History  Procedure Laterality Date  . Uvulopalatopharyngoplasty (uppp)/tonsillectomy/septoplasty  1990's  . Coronary angioplasty with stent placement  07/17/2014    a. DES to 80% mid LAD  . Tonsillectomy and adenoidectomy  1990's  . Appendectomy  ~ 1981  . Carpal tunnel release Left 1990's  . Trigger finger release Left 1990's    2nd & 5th digits  . Hernia repair    . Umbilical hernia repair  1980's    w/mesh  . Laparoscopic incisional / umbilical / ventral hernia repair  1990's    "it was a double; not inguinal"  . Left heart catheterization with coronary angiogram N/A 07/17/2014    Procedure: LEFT HEART CATHETERIZATION WITH CORONARY ANGIOGRAM;  Surgeon: [name], MD;  Location: [facility]  CATH LAB;  Service: Cardiovascular;  Laterality: N/A;  . Percutaneous stent intervention  07/17/2014    Procedure: PERCUTANEOUS STENT INTERVENTION;  Surgeon: Wendall Stade, MD;  Location: [facility] CATH LAB;  Service: Cardiovascular;;     Current Outpatient Prescriptions  Medication Sig Dispense Refill  . ACCU-CHEK FASTCLIX LANCETS MISC Check BS 4 times a day 102 each 11  . albuterol (VENTOLIN HFA) 108 (90 BASE) MCG/ACT inhaler Inhale 1 puff into the lungs every 4 (four) hours as needed. for shortness of breath. Or may use every 6 hours as needed.    . ALPRAZolam (XANAX) 0.5 MG tablet Take 1 tablet (0.5 mg total) by mouth at bedtime as needed. 30 tablet 5  . amLODipine (NORVASC) 10 MG tablet TAKE ONE TABLET BY MOUTH ONCE DAILY. 31 tablet 11  . aspirin 81 MG EC tablet Take 81 mg by mouth daily.      . ATRIPLA 600-200-300 MG per  tablet TAKE 1 TABLET BY MOUTH DAILY 30 tablet 6  . B-D INS SYRINGE 0.5CC/30GX1/2" 30G X 1/2" 0.5 ML MISC     . Blood Glucose Monitoring Suppl (ACCU-CHEK NANO SMARTVIEW) W/DEVICE KIT Check blood sugars 4 times a day 1 kit 0  . clopidogrel (PLAVIX) 75 MG tablet Take 1 tablet (75 mg total) by mouth daily. 90 tablet 3  . EQ LORATADINE 10 MG tablet TAKE ONE TABLET BY MOUTH ONCE DAILY 30 tablet 11  . folic acid (FOLVITE) 1 MG tablet Take 1 tablet (1 mg total) by mouth daily. 100 tablet 3  . glucose blood (ACCU-CHEK SMARTVIEW) test strip Check BS 4 times a day 150 each 12  . hydrochlorothiazide (HYDRODIURIL) 25 MG tablet TAKE ONE TABLET BY MOUTH ONCE DAILY 30 tablet 11  . HYDROcodone-acetaminophen (NORCO) 7.5-325 MG per tablet Take 1 tablet by mouth every 6 (six) hours as needed for moderate pain. Rx 2 of 2. Fill 30 days after last script. 60 tablet 0  . ibuprofen (ADVIL,MOTRIN) 200 MG tablet Take 400 mg by mouth every 6 (six) hours as needed for moderate pain.    Marland Kitchen insulin NPH Human (HUMULIN N) 100 UNIT/ML injection Inject 0.12 mLs (12 Units total) into the skin 2 (two) times daily before a meal. 10 mL 3  . lisinopril (PRINIVIL,ZESTRIL) 10 MG tablet Take 1 tablet (10 mg total) by mouth daily. 90 tablet 3  . metFORMIN (GLUCOPHAGE) 1000 MG tablet Take 1 tablet (1,000 mg total) by mouth 2 (two) times daily with a meal. 180 tablet 3  . Multiple Vitamin (MULTIVITAMIN WITH MINERALS) TABS Take 1 tablet by mouth daily.    . nitroGLYCERIN (NITROSTAT) 0.4 MG SL tablet Place 1 tablet (0.4 mg total) under the tongue every 5 (five) minutes as needed for chest pain. 25 tablet 3  . omeprazole (PRILOSEC) 20 MG capsule Take 1 capsule (20 mg total) by mouth daily. 30 capsule prn  . pravastatin (PRAVACHOL) 40 MG tablet TAKE ONE TABLET BY MOUTH AT BEDTIME 90 tablet 3  . terazosin (HYTRIN) 2 MG capsule TAKE TWO CAPSULES BY MOUTH AT BEDTIME 60 capsule 3   No current facility-administered medications for this visit.     Allergies:   Penicillins; Zoloft; and Chantix    Social History:  The patient  reports that he has been smoking Cigarettes.  He has a 46 pack-year smoking history. He has never used smokeless tobacco. He reports that he drinks about 0.6 oz of alcohol per week. He reports that he does not use illicit drugs.  Family History:  The patient's family history includes Heart attack in his brother and father; Heart disease in his father; Osteoporosis in his mother; Stroke in his maternal grandfather, maternal grandmother, paternal grandfather, and paternal grandmother.    ROS:   Please see the history of present illness.   Review of Systems  Genitourinary:       Erectile Dysfunction  All other systems reviewed and are negative.    PHYSICAL EXAM: VS:  BP 120/70 mmHg  Pulse 65  Ht $R'5\' 10"'HR$  (1.778 m)  Wt 88.996 kg (196 lb 3.2 oz)  BMI 28.15 kg/m2  SpO2 98%    Wt Readings from Last 3 Encounters:  03/13/15 88.996 kg (196 lb 3.2 oz)  12/10/14 90.629 kg (199 lb 12.8 oz)  12/06/14 88.905 kg (196 lb)     GEN: Well nourished, well developed, in no acute distress HEENT: normal Neck: no JVD, no masses Cardiac:  Normal S1/S2, RRR; no murmur, no rubs or gallops, no edema  Respiratory:  clear to auscultation bilaterally, no wheezing, rhonchi or rales. GI: soft, nontender, nondistended, + BS MS: no deformity or atrophy Skin: warm and dry  Neuro:  CNs II-XII intact, Strength and sensation are intact Psych: Normal affect   EKG:   11/26/14  SR rate 88 nonspecific ST T wave changes   Recent Labs: 11/26/2014: ALT 46; BUN 10; Creat 0.84; Hemoglobin 15.3; Platelets 190; Potassium 4.1; Sodium 137    Lipid Panel    Component Value Date/Time   CHOL 178 11/26/2014 1701   TRIG 231* 11/26/2014 1701   HDL 47 11/26/2014 1701   CHOLHDL 3.8 11/26/2014 1701   VLDL 46* 11/26/2014 1701   LDLCALC 85 11/26/2014 1701      ASSESSMENT AND PLAN:  1.  Chest pain:  Resolved.  He thinks it was related to  anxiety or his eating habits.  No further recurrence.  Recent Myoview low risk.   2.  CAD:  Recent myoview low risk.  No further CP.  Continue aspirin, Effient, amlodipine, pravastatin, lisinopril. 3.  HTN:  Controlled.  4.  Hyperlipidemia:  Continue statin. Recent LDL 85.  We discussed working on diet.  Continue current dose of statin.  5.  COPD:  FU with PCP.  CXR this year ok  6.  Tobacco Abuse:  He is trying to quit. Counseled  7/  HIV  CD 4 counts fine continue 2 drug Rx f/u [facility]    Current medicines are reviewed at length with the patient today.  The patient does not have concerns regarding medicines.  The following changes have been made: None.   Labs/ tests ordered today include:  No orders of the defined types were placed in this encounter.    Disposition:   FU with me in  6 months    Signed, Versie Starks, MHS 03/13/2015 8:56 AM    [facility] [place], [place], [place]  [address] Phone: [phone]; Fax: [phone]

## 2015-03-13 ENCOUNTER — Other Ambulatory Visit: Payer: Self-pay

## 2015-03-13 ENCOUNTER — Ambulatory Visit (INDEPENDENT_AMBULATORY_CARE_PROVIDER_SITE_OTHER): Payer: Commercial Managed Care - HMO | Admitting: Cardiovascular Disease

## 2015-03-13 ENCOUNTER — Encounter: Payer: Self-pay | Admitting: Cardiovascular Disease

## 2015-03-13 VITALS — BP 120/70 | HR 65 | Ht 70.0 in | Wt 196.2 lb

## 2015-03-13 DIAGNOSIS — I2510 Atherosclerotic heart disease of native coronary artery without angina pectoris: Secondary | ICD-10-CM

## 2015-03-13 DIAGNOSIS — I2583 Coronary atherosclerosis due to lipid rich plaque: Principal | ICD-10-CM

## 2015-03-13 NOTE — Patient Instructions (Signed)
Your physician wants you to follow-up in:  6 MONTHS WITH DR NISHAN  You will receive a reminder letter in the mail two months in advance. If you don't receive a letter, please call our office to schedule the follow-up appointment. Your physician recommends that you continue on your current medications as directed. Please refer to the Current Medication list given to you today. 

## 2015-03-15 ENCOUNTER — Other Ambulatory Visit: Payer: Self-pay | Admitting: Internal Medicine

## 2015-03-18 ENCOUNTER — Other Ambulatory Visit: Payer: Self-pay | Admitting: Internal Medicine

## 2015-03-19 MED ORDER — HYDROCODONE-ACETAMINOPHEN 7.5-325 MG PO TABS: 1.0000 | ORAL_TABLET | Freq: Four times a day (QID) | ORAL | Status: DC | PRN

## 2015-03-25 ENCOUNTER — Other Ambulatory Visit: Payer: Self-pay

## 2015-04-12 ENCOUNTER — Other Ambulatory Visit: Payer: Self-pay | Admitting: Internal Medicine

## 2015-04-17 ENCOUNTER — Other Ambulatory Visit: Payer: Self-pay | Admitting: Internal Medicine

## 2015-04-17 MED ORDER — HYDROCODONE-ACETAMINOPHEN 7.5-325 MG PO TABS: 1.0000 | ORAL_TABLET | Freq: Four times a day (QID) | ORAL | Status: DC | PRN

## 2015-04-17 NOTE — Telephone Encounter (Signed)
Last picked up at [place] 6/22 Last UDS 11/2014 Do not see that it showed alprazolam, filled at [place] 2/25,  narcotic data bank ran in his folder Next appt 8/2

## 2015-04-25 ENCOUNTER — Other Ambulatory Visit: Payer: Self-pay | Admitting: Internal Medicine

## 2015-04-29 ENCOUNTER — Telehealth: Payer: Self-pay | Admitting: Dietician

## 2015-04-29 NOTE — Telephone Encounter (Signed)
Called patient to see if he had scheduled and gone to his eye doctor in the past year as previously stated. He has not and wishes to have retinal images done here if possible. CDE will try to arrange.

## 2015-04-29 NOTE — Telephone Encounter (Signed)
Thank you Donna 

## 2015-04-30 ENCOUNTER — Encounter: Payer: Self-pay | Admitting: Internal Medicine

## 2015-04-30 ENCOUNTER — Ambulatory Visit (INDEPENDENT_AMBULATORY_CARE_PROVIDER_SITE_OTHER): Payer: Commercial Managed Care - HMO | Admitting: Internal Medicine

## 2015-04-30 VITALS — BP 123/76 | HR 68 | Temp 97.7°F | Ht 70.0 in | Wt 197.8 lb

## 2015-04-30 DIAGNOSIS — M069 Rheumatoid arthritis, unspecified: Secondary | ICD-10-CM | POA: Diagnosis not present

## 2015-04-30 DIAGNOSIS — E119 Type 2 diabetes mellitus without complications: Secondary | ICD-10-CM

## 2015-04-30 DIAGNOSIS — Z0000 Encounter for general adult medical examination without abnormal findings: Secondary | ICD-10-CM

## 2015-04-30 DIAGNOSIS — Z794 Long term (current) use of insulin: Secondary | ICD-10-CM | POA: Diagnosis not present

## 2015-04-30 DIAGNOSIS — E1165 Type 2 diabetes mellitus with hyperglycemia: Secondary | ICD-10-CM

## 2015-04-30 DIAGNOSIS — I10 Essential (primary) hypertension: Secondary | ICD-10-CM

## 2015-04-30 DIAGNOSIS — F17210 Nicotine dependence, cigarettes, uncomplicated: Secondary | ICD-10-CM

## 2015-04-30 DIAGNOSIS — F17200 Nicotine dependence, unspecified, uncomplicated: Secondary | ICD-10-CM

## 2015-04-30 LAB — POCT GLYCOSYLATED HEMOGLOBIN (HGB A1C): Hemoglobin A1C: 9.8

## 2015-04-30 LAB — GLUCOSE, CAPILLARY: GLUCOSE-CAPILLARY: 229 mg/dL — AB (ref 65–99)

## 2015-04-30 MED ORDER — "INSULIN SYRINGE-NEEDLE U-100 30G X 1/2"" 0.5 ML MISC": Status: DC

## 2015-04-30 MED ORDER — INSULIN NPH (HUMAN) (ISOPHANE) 100 UNIT/ML ~~LOC~~ SUSP: 15.0000 [IU] | Freq: Two times a day (BID) | SUBCUTANEOUS | Status: DC

## 2015-04-30 NOTE — Assessment & Plan Note (Signed)
-   Will give prevnar today 

## 2015-04-30 NOTE — Assessment & Plan Note (Addendum)
Lab Results  Component Value Date   HGBA1C 9.8 04/30/2015   HGBA1C canceled 04/30/2015   HGBA1C 7.3 11/22/2014     Assessment: Diabetes control:  poor Progress toward A1C goal: deteriorated   Comments: patient has not been compliant with his insulin. Occasionally forgets to take it  Plan: Medications:  Will increase NPH to 15 units bid Home glucose monitoring: Frequency:   Timing:   Instruction/counseling given: reminded to get eye exam, reminded to bring blood glucose meter & log to each visit and discussed diet Educational resources provided: brochure (denies) Self management tools provided: copy of home glucose meter download Other plans: Will f/u in 3 months. Patient states he is compliant with diet and exercise. Will refer to optho for eye exam

## 2015-04-30 NOTE — Assessment & Plan Note (Signed)
BP Readings from Last 3 Encounters:  04/30/15 123/76  03/13/15 120/70  12/10/14 125/70    Lab Results  Component Value Date   NA 137 11/26/2014   K 4.1 11/26/2014   CREATININE 0.84 11/26/2014    Assessment: Blood pressure control:  well controlled Progress toward BP goal:   at goal Comments: patient is compliant with meds  Plan: Medications:  continue current medications Educational resources provided: brochure (denies) Self management tools provided:   Other plans: will reassess in 3 -6 months

## 2015-04-30 NOTE — Progress Notes (Signed)
   Subjective:    Patient ID: Riley Jones, male    DOB: 19-Feb-1956, 59 y.o.   MRN: [id number]  HPI Patient seen and examined. He is here for routine follow up of his diabetes and HTN. Of note, he has also been worked up by his cardiologist for chest pain noted on his last appointment and it was normal. ] He also complains of pain in his back, left elbow and knees which he states is consistent with his RA. States pain is controlled on current pain regimen. No fevers/chills, no joint swelling.   He also wanted to know if there was anything he could be prescribed anything for erectile dysfunction.    Review of Systems  Constitutional: Negative.   HENT: Negative.   Respiratory: Negative.   Cardiovascular: Negative.   Gastrointestinal: Negative.   Genitourinary: Negative.   Musculoskeletal: Positive for back pain and arthralgias. Negative for myalgias, joint swelling, neck pain and neck stiffness.  Skin: Negative.   Neurological: Negative.   Psychiatric/Behavioral: Negative.        Objective:   Physical Exam  Constitutional: He is oriented to person, place, and time. He appears well-developed and well-nourished.  HENT:  Head: Normocephalic and atraumatic.  Eyes: Conjunctivae are normal.  Neck: Normal range of motion. Neck supple.  Cardiovascular: Normal rate and regular rhythm.   Pulmonary/Chest: Effort normal and breath sounds normal. No respiratory distress.  Abdominal: Soft. Bowel sounds are normal. He exhibits no distension. There is no tenderness.  Musculoskeletal: Normal range of motion. He exhibits no edema.  No swelling or increased local warmth over left elbow or knees. Normal ROM.    Neurological: He is alert and oriented to person, place, and time.  Skin: Skin is warm and dry. No rash noted. No erythema.  Psychiatric: He has a normal mood and affect. His behavior is normal.          Assessment & Plan:  Please see problem based charting for assessment and  plan:

## 2015-04-30 NOTE — Patient Instructions (Signed)
-   It was a pleasure seeing you today - We will increase your insulin to 15 units twice a day before meals - Please continue with your diabetic diet and exercising. Your A1C has worsened  - we will refer you to an eye doctor and a rheumatologist - We will give you a pneumonia vaccine  Plantar Fasciitis Plantar fasciitis is a common condition that causes foot pain. It is soreness (inflammation) of the band of tough fibrous tissue on the bottom of the foot that runs from the heel bone (calcaneus) to the ball of the foot. The cause of this soreness may be from excessive standing, poor fitting shoes, running on hard surfaces, being overweight, having an abnormal walk, or overuse (this is common in runners) of the painful foot or feet. It is also common in aerobic exercise dancers and ballet dancers. SYMPTOMS  Most people with plantar fasciitis complain of:  Severe pain in the morning on the bottom of their foot especially when taking the first steps out of bed. This pain recedes after a few minutes of walking.  Severe pain is experienced also during walking following a long period of inactivity.  Pain is worse when walking barefoot or up stairs DIAGNOSIS   Your caregiver will diagnose this condition by examining and feeling your foot.  Special tests such as X-rays of your foot, are usually not needed. PREVENTION   Consult a sports medicine professional before beginning a new exercise program.  Walking programs offer a good workout. With walking there is a lower chance of overuse injuries common to runners. There is less impact and less jarring of the joints.  Begin all new exercise programs slowly. If problems or pain develop, decrease the amount of time or distance until you are at a comfortable level.  Wear good shoes and replace them regularly.  Stretch your foot and the heel cords at the back of the ankle (Achilles tendon) both before and after exercise.  Run or exercise on even  surfaces that are not hard. For example, asphalt is better than pavement.  Do not run barefoot on hard surfaces.  If using a treadmill, vary the incline.  Do not continue to workout if you have foot or joint problems. Seek professional help if they do not improve. HOME CARE INSTRUCTIONS   Avoid activities that cause you pain until you recover.  Use ice or cold packs on the problem or painful areas after working out.  Only take over-the-counter or prescription medicines for pain, discomfort, or fever as directed by your caregiver.  Soft shoe inserts or athletic shoes with air or gel sole cushions may be helpful.  If problems continue or become more severe, consult a sports medicine caregiver or your own health care provider. Cortisone is a potent anti-inflammatory medication that may be injected into the painful area. You can discuss this treatment with your caregiver. MAKE SURE YOU:   Understand these instructions.  Will watch your condition.  Will get help right away if you are not doing well or get worse. Document Released: 06/09/2001 Document Revised: 12/07/2011 Document Reviewed: 08/08/2008 [facility] Patient Information 2015 [place], [place]. This information is not intended to replace advice given to you by your health care provider. Make sure you discuss any questions you have with your health care provider.  today

## 2015-04-30 NOTE — Assessment & Plan Note (Signed)
  Assessment: Progress toward smoking cessation:   fair Barriers to progress toward smoking cessation:   still not ready to attempt to quit smoking Comments: Patient has a program at work to help him quit but has not decided to try yet. Counseled on importance of smoking cessation  Plan: Instruction/counseling given:  I counseled patient on the dangers of tobacco use, advised patient to stop smoking, and reviewed strategies to maximize success. Educational resources provided:  [employer] account manager) brochure (denies) Self management tools provided:    Medications to assist with smoking cessation:  None Patient agreed to the following self-care plans for smoking cessation:    Other plans: Patient has tried and failed chantix in the past as well as welbutrin and nicotine patches. Is not ready to quit at this time and still smokes 1 PPD. Will discuss again at follow up

## 2015-04-30 NOTE — Assessment & Plan Note (Signed)
-   Patient with left elbow, b/l knee and back pain - His pain is well controlled on current pain regimen but states that his pain is gradually worsening - He did see a rheumatologist in the past. Will refer him back for treatment for RA given progression of pain

## 2015-05-01 ENCOUNTER — Telehealth: Payer: Self-pay | Admitting: *Deleted

## 2015-05-01 NOTE — Telephone Encounter (Signed)
Left patient a voice mail to call the clinic to schedule an appt with the financial counselor to do his ADAP renewal.

## 2015-05-02 ENCOUNTER — Telehealth: Payer: Self-pay | Admitting: Dietician

## 2015-05-02 NOTE — Telephone Encounter (Signed)
Patient does not want to do retinal camera images here. He wants a complete eye exam by eye doctor. He is waiting to hear from our referral coordinators for his appointment.

## 2015-05-06 ENCOUNTER — Telehealth: Payer: Self-pay | Admitting: *Deleted

## 2015-05-07 ENCOUNTER — Ambulatory Visit: Payer: Commercial Managed Care - HMO

## 2015-05-07 DIAGNOSIS — Z23 Encounter for immunization: Secondary | ICD-10-CM | POA: Diagnosis not present

## 2015-05-11 ENCOUNTER — Other Ambulatory Visit: Payer: Self-pay | Admitting: Internal Medicine

## 2015-05-13 NOTE — Telephone Encounter (Signed)
Med D/C'd Oct and I see nothing on problem list to indicate need. Dr Dareen Piano back tomorrow to address.

## 2015-05-16 ENCOUNTER — Telehealth: Payer: Self-pay | Admitting: *Deleted

## 2015-05-16 NOTE — Telephone Encounter (Signed)
LVM FOR PATIENT / EYE APPOITMENT WITH [facility] / 9/8/016 @ 9:00AM [place] [phone].

## 2015-05-17 ENCOUNTER — Other Ambulatory Visit: Payer: Self-pay | Admitting: Internal Medicine

## 2015-05-17 MED ORDER — HYDROCODONE-ACETAMINOPHEN 7.5-325 MG PO TABS: 1.0000 | ORAL_TABLET | Freq: Four times a day (QID) | ORAL | Status: DC | PRN

## 2015-05-17 NOTE — Telephone Encounter (Signed)
Added request to new escript request

## 2015-05-17 NOTE — Telephone Encounter (Signed)
informed

## 2015-05-30 ENCOUNTER — Telehealth: Payer: Self-pay | Admitting: *Deleted

## 2015-05-30 NOTE — Telephone Encounter (Signed)
CALLED PATIENT LVM FOR PATIENT WITH APPOINTMENT INFORMATION DR Trudie Reed (RHEUMATOLOGY- [phone]) September 8.016 @ 9:00AM.

## 2015-06-06 DIAGNOSIS — H16143 Punctate keratitis, bilateral: Secondary | ICD-10-CM | POA: Diagnosis not present

## 2015-06-06 DIAGNOSIS — Z794 Long term (current) use of insulin: Secondary | ICD-10-CM | POA: Diagnosis not present

## 2015-06-06 DIAGNOSIS — H2513 Age-related nuclear cataract, bilateral: Secondary | ICD-10-CM | POA: Diagnosis not present

## 2015-06-06 DIAGNOSIS — E119 Type 2 diabetes mellitus without complications: Secondary | ICD-10-CM | POA: Diagnosis not present

## 2015-06-06 LAB — HM DIABETES EYE EXAM

## 2015-06-13 DIAGNOSIS — J449 Chronic obstructive pulmonary disease, unspecified: Secondary | ICD-10-CM | POA: Diagnosis not present

## 2015-06-13 DIAGNOSIS — M4120 Other idiopathic scoliosis, site unspecified: Secondary | ICD-10-CM | POA: Diagnosis not present

## 2015-06-13 DIAGNOSIS — I25110 Atherosclerotic heart disease of native coronary artery with unstable angina pectoris: Secondary | ICD-10-CM | POA: Diagnosis not present

## 2015-06-13 DIAGNOSIS — Z21 Asymptomatic human immunodeficiency virus [HIV] infection status: Secondary | ICD-10-CM | POA: Diagnosis not present

## 2015-06-13 DIAGNOSIS — Z720 Tobacco use: Secondary | ICD-10-CM | POA: Diagnosis not present

## 2015-06-13 DIAGNOSIS — M069 Rheumatoid arthritis, unspecified: Secondary | ICD-10-CM | POA: Diagnosis not present

## 2015-06-18 ENCOUNTER — Other Ambulatory Visit: Payer: Self-pay | Admitting: *Deleted

## 2015-06-18 ENCOUNTER — Other Ambulatory Visit: Payer: Self-pay | Admitting: Internal Medicine

## 2015-06-18 ENCOUNTER — Encounter: Payer: Self-pay | Admitting: Internal Medicine

## 2015-06-20 MED ORDER — HYDROCODONE-ACETAMINOPHEN 7.5-325 MG PO TABS
1.0000 | ORAL_TABLET | Freq: Four times a day (QID) | ORAL | Status: DC | PRN
Start: 2015-06-20 — End: 2015-08-20

## 2015-06-20 NOTE — Telephone Encounter (Signed)
Pt aware Rx is ready. 

## 2015-06-20 NOTE — Telephone Encounter (Signed)
Pt aware.

## 2015-06-28 DIAGNOSIS — Z720 Tobacco use: Secondary | ICD-10-CM | POA: Diagnosis not present

## 2015-06-28 DIAGNOSIS — Z21 Asymptomatic human immunodeficiency virus [HIV] infection status: Secondary | ICD-10-CM | POA: Diagnosis not present

## 2015-06-28 DIAGNOSIS — J449 Chronic obstructive pulmonary disease, unspecified: Secondary | ICD-10-CM | POA: Diagnosis not present

## 2015-06-28 DIAGNOSIS — M4120 Other idiopathic scoliosis, site unspecified: Secondary | ICD-10-CM | POA: Diagnosis not present

## 2015-06-28 DIAGNOSIS — E1065 Type 1 diabetes mellitus with hyperglycemia: Secondary | ICD-10-CM | POA: Diagnosis not present

## 2015-06-28 DIAGNOSIS — M069 Rheumatoid arthritis, unspecified: Secondary | ICD-10-CM | POA: Diagnosis not present

## 2015-06-28 DIAGNOSIS — I25110 Atherosclerotic heart disease of native coronary artery with unstable angina pectoris: Secondary | ICD-10-CM | POA: Diagnosis not present

## 2015-07-17 ENCOUNTER — Other Ambulatory Visit: Payer: Self-pay | Admitting: Internal Medicine

## 2015-07-18 NOTE — Telephone Encounter (Signed)
Pt informed refill ready.

## 2015-07-19 ENCOUNTER — Other Ambulatory Visit: Payer: Self-pay | Admitting: Infectious Diseases

## 2015-07-25 ENCOUNTER — Other Ambulatory Visit: Payer: Self-pay | Admitting: Internal Medicine

## 2015-07-25 ENCOUNTER — Encounter: Payer: Self-pay | Admitting: *Deleted

## 2015-07-28 ENCOUNTER — Other Ambulatory Visit: Payer: Self-pay | Admitting: Internal Medicine

## 2015-07-31 ENCOUNTER — Other Ambulatory Visit: Payer: Self-pay | Admitting: Nurse Practitioner

## 2015-08-12 ENCOUNTER — Ambulatory Visit (INDEPENDENT_AMBULATORY_CARE_PROVIDER_SITE_OTHER): Payer: Commercial Managed Care - HMO | Admitting: Internal Medicine

## 2015-08-12 ENCOUNTER — Encounter: Payer: Self-pay | Admitting: Internal Medicine

## 2015-08-12 VITALS — BP 126/71 | HR 80 | Temp 98.2°F | Ht 70.0 in | Wt 200.8 lb

## 2015-08-12 DIAGNOSIS — M069 Rheumatoid arthritis, unspecified: Secondary | ICD-10-CM | POA: Diagnosis not present

## 2015-08-12 LAB — GLUCOSE, CAPILLARY: GLUCOSE-CAPILLARY: 297 mg/dL — AB (ref 65–99)

## 2015-08-12 LAB — POCT GLYCOSYLATED HEMOGLOBIN (HGB A1C): Hemoglobin A1C: 9.6

## 2015-08-12 NOTE — Assessment & Plan Note (Signed)
Pt says that he started taking methotrexate prescribed by rheumatologist about 1-2 months ago, and ever since then he has been breaking out, and having "rash", like a sandpaper, and a lot of pruritus, especially in the popliteal fossa. He is also having "neuropathic" like pain going down the left leg upto mid thigh and a lot of burning.  He wonders if this might be shingles as he has had this before.Pt took shingles vaccine last year. Pt is concurrently taking folic acid/.  He also says that he has been feeling a general sense of malaise, and having watery nonbloody non-mucous diarrhea about 10 times a day since starting the MTX to the extent he has to be near bathroom all day. He also is having insomnia.  Regarding his HIV, it has been well controlled and he has been taking his meds regularly and follows with ID clinic here- and last CD4 count was 1020 in Feb. Pt denies any systemic symptoms and no other ROS all negative.  Pt was taking an incorrect amount of the methotrexate as he takes the injectable version. He was prescribed 0.6 cc, but he only takes 0.25 . So doing the calculation, he currently takes the 7.5 mg weekly, which is the lowest dose.   He says his RA is well controlled and he has not had any flareups, but he had hoped that his pain would improve after he started the MTX.  A/P- likely this is a reaction from methotrexate  Especially due to the temporal relationship. Exam revealed faint macular rash on his abdomen.  Advised patient to stop taking methotrexate. Since he is only taking the lowest dose, we did not think he needed to taper this down. Advised him to contact his rheumatologist promptly so he can be put on a different regimen Advised to use OTC hydrocortisone cream for symptomatic relief from itching. The clinic note will be forwarded to his rheumatologist Pt has follow up appt with ID

## 2015-08-12 NOTE — Progress Notes (Signed)
Patient ID: Riley Jones, male   DOB: 1956-06-07, 59 y.o.   MRN: [id number]    Subjective:   Patient ID: Riley Jones male   DOB: 1955-11-12 59 y.o.   MRN: [id number]  HPI: Riley Jones is a 59 y.o. man with notable history of HIV on atripla, rheumatoid arthritis, currently on methotrexate, T2Dm, HTn, who is here for a concern regarding rash.  Pt says that he started taking methotrexate prescribed by rheumatologist about 1-2 months ago, and ever since then he has been breaking out, and having "rash", like a sandpaper, and a lot of pruritus, especially in the popliteal fossa.  He is also having "neuropathic" like pain going down the left leg upto mid thigh and a lot of burning.  He wonders if this might be shingles as he has had this before.Pt took shingles vaccine last year.  Pt is concurrently taking folic acid/.  He also says that he has been feeling a general sense of malaise, and having watery nonbloody non-mucous diarrhea about 10 times a day since starting the MTX to the extent he has to be near bathroom all day. He also is having insomnia.  Regarding his HIV, it has been well controlled and he has been taking his meds regularly and follows with ID clinic here- and last CD4 count was 1020 in Feb.   Past Medical History  Diagnosis Date  . Hypertension     Well Controlled off meds  . 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 Ola Spurr (ID)  . Hyperlipidemia   . Atypical angina     Myoview 2003, Normal EF 64%  . Foot pain 03/30/2011  . COPD (chronic obstructive pulmonary disease)     "mild" (07/17/2014)  . Asthma     "mild" (07/17/2014)  . Pneumonia 4-5 times  . Sleep apnea     does not wear mask (07/17/2014)  . Type II diabetes mellitus   . GERD (gastroesophageal reflux disease)   . H/O hiatal hernia   . Headache     "weekly" (06/2014)  . Rheumatoid arthritis(714.0)     Followed by Dr. Charlestine Night, off MTX and prdnisone since  2007  . Arthritis     "pretty much all over"  . History of gout   . Anxiety   . Depression   . Kidney stones     "passed them all"  . CAD (coronary artery disease)     a. 10/20 DES to 80% mid LAD  . Hx of cardiovascular stress test     ETT-Myoview (3/16):  Ex 10:15, No Ischemia, EF 57%; Normal Study   Current Outpatient Prescriptions  Medication Sig Dispense Refill  . ACCU-CHEK FASTCLIX LANCETS MISC Check BS 4 times a day 102 each 11  . albuterol (VENTOLIN HFA) 108 (90 BASE) MCG/ACT inhaler Inhale 1 puff into the lungs every 4 (four) hours as needed. for shortness of breath. Or may use every 6 hours as needed.    . ALPRAZolam (XANAX) 0.5 MG tablet TAKE ONE TABLET BY MOUTH AT BEDTIME AS NEEDED 30 tablet 0  . amLODipine (NORVASC) 10 MG tablet TAKE ONE TABLET BY MOUTH ONCE DAILY. 31 tablet 11  . aspirin 81 MG EC tablet Take 81 mg by mouth daily.      . ATRIPLA 600-200-300 MG tablet TAKE 1 TABLET BY MOUTH DAILY 30 tablet 1  . Blood Glucose Monitoring Suppl (ACCU-CHEK NANO SMARTVIEW) W/DEVICE KIT Check blood sugars 4  times a day 1 kit 0  . clopidogrel (PLAVIX) 75 MG tablet Take 1 tablet (75 mg total) by mouth daily. 90 tablet 3  . EQ LORATADINE 10 MG tablet TAKE ONE TABLET BY MOUTH ONCE DAILY 30 tablet 11  . folic acid (FOLVITE) 1 MG tablet Take 1 tablet (1 mg total) by mouth daily. 100 tablet 3  . glucose blood (ACCU-CHEK SMARTVIEW) test strip Check BS 4 times a day 150 each 12  . hydrochlorothiazide (HYDRODIURIL) 25 MG tablet TAKE ONE TABLET BY MOUTH ONCE DAILY 30 tablet 11  . HYDROcodone-acetaminophen (NORCO) 7.5-325 MG per tablet Take 1 tablet by mouth every 6 (six) hours as needed for moderate pain. 60 tablet 0  . insulin NPH Human (HUMULIN N) 100 UNIT/ML injection Inject 0.15 mLs (15 Units total) into the skin 2 (two) times daily before a meal. 10 mL 3  . Insulin Syringe-Needle U-100 (B-D INS SYRINGE 0.5CC/30GX1/2") 30G X 1/2" 0.5 ML MISC Inject insulin twice a day before meals 100 each  3  . lisinopril (PRINIVIL,ZESTRIL) 10 MG tablet TAKE ONE TABLET BY MOUTH DAILY. 90 tablet 2  . metFORMIN (GLUCOPHAGE) 1000 MG tablet Take 1 tablet (1,000 mg total) by mouth 2 (two) times daily with a meal. 180 tablet 3  . Multiple Vitamin (MULTIVITAMIN WITH MINERALS) TABS Take 1 tablet by mouth daily.    . nitroGLYCERIN (NITROSTAT) 0.4 MG SL tablet Place 1 tablet (0.4 mg total) under the tongue every 5 (five) minutes as needed for chest pain. 25 tablet 3  . omeprazole (PRILOSEC) 20 MG capsule Take 1 capsule (20 mg total) by mouth daily. 30 capsule prn  . pravastatin (PRAVACHOL) 40 MG tablet TAKE ONE TABLET BY MOUTH AT BEDTIME 90 tablet 3  . terazosin (HYTRIN) 2 MG capsule TAKE TWO CAPSULES BY MOUTH AT BEDTIME 180 capsule 3   No current facility-administered medications for this visit.   Family History  Problem Relation Age of Onset  . Osteoporosis Mother   . Heart disease Father   . Heart attack Father   . Heart attack Brother   . Stroke Maternal Grandmother   . Stroke Maternal Grandfather   . Stroke Paternal Grandmother   . Stroke Paternal Grandfather    Social History   Social History  . Marital Status: Single    Spouse Name: N/A  . Number of Children: N/A  . Years of Education: 13   Occupational History  . Retired Administrator    Social History Main Topics  . Smoking status: Current Every Day Smoker -- 1.00 packs/day for 46 years    Types: Cigarettes  . Smokeless tobacco: Never Used     Comment: thinking about  . Alcohol Use: 0.6 oz/week    1 Glasses of wine per week     Comment: 07/17/2014 "glass of wine once or twice/month"  . Drug Use: No  . Sexual Activity:    Partners: Male     Comment: given condoms   Other Topics Concern  . Not on file   Social History Narrative   [place] beginning 12/11/2009 - 12/27/2010   [employer] benefits approved; patient eligible for 100% discount for out patient labs and office visits. Patient eligible for 70% discount for  other services per [employer] 09/08/2010      Patient is on disability, and walks 2-3 times per week.    Review of Systems: Review of Systems  Constitutional: Positive for malaise/fatigue. Negative for fever.  Respiratory: Negative for cough, sputum  production and shortness of breath.   Cardiovascular: Negative for chest pain.  Gastrointestinal: Positive for diarrhea. Negative for nausea, vomiting, abdominal pain, constipation and blood in stool.  Genitourinary: Negative for dysuria, frequency and hematuria.  Musculoskeletal: Negative for myalgias.  Skin: Positive for itching and rash.  Neurological: Positive for tingling. Negative for focal weakness, weakness and headaches.    Objective:  Physical Exam: Filed Vitals:   08/12/15 1537  BP: 126/71  Pulse: 80  Temp: 98.2 F (36.8 C)  TempSrc: Oral  Height: $Remove'5\' 10"'RLvgHQZ$  (1.778 m)  Weight: 200 lb 12.8 oz (91.082 kg)  SpO2: 98%   Physical Exam  Constitutional: He appears well-developed and well-nourished.  Truncal obesity likely from longstanding HIV medicines  HENT:  Head: Normocephalic and atraumatic.  Cardiovascular: Normal rate, regular rhythm, normal heart sounds and intact distal pulses.   No murmur heard. Pulmonary/Chest: Effort normal and breath sounds normal. He has no wheezes.  Abdominal: Soft. Bowel sounds are normal. He exhibits no distension. There is no tenderness.  Skin:  Faint macular rash on LLQ Faint erythema but very hard to discern    Assessment & Plan:   Please see problem based charting for assessment and plan

## 2015-08-12 NOTE — Patient Instructions (Signed)
Thank you for your visit today Please stop taking the methotrexate Please let your rheumatologist know that you were having an allergic reaction to the methotrexate injectable form- and describe the symptoms you were having, and discuss about alternatives

## 2015-08-13 NOTE — Progress Notes (Signed)
Internal Medicine Clinic Attending  I saw and evaluated the patient.  I personally confirmed the key portions of the history and exam documented by Dr. Tiburcio Pea and I reviewed pertinent patient test results.  The assessment, diagnosis, and plan were formulated together and I agree with the documentation in the resident's note.  Rash was apparent on anterior abdomen, near belt line, circular, fine rash, raised non papular with mild erythema.  Appeared to be allergic in nature.  Itchy per patient.  This did not appear to be shingles, and given the wide spreading nature and waxing/waning nature, I think this is mostly likely a drug reaction.  Agree with stopping the medication and evaluating for improvement.

## 2015-08-16 ENCOUNTER — Other Ambulatory Visit: Payer: Self-pay | Admitting: Infectious Diseases

## 2015-08-19 ENCOUNTER — Other Ambulatory Visit: Payer: Self-pay | Admitting: *Deleted

## 2015-08-19 ENCOUNTER — Other Ambulatory Visit: Payer: Self-pay | Admitting: Internal Medicine

## 2015-08-20 ENCOUNTER — Ambulatory Visit (INDEPENDENT_AMBULATORY_CARE_PROVIDER_SITE_OTHER): Payer: Commercial Managed Care - HMO | Admitting: Internal Medicine

## 2015-08-20 ENCOUNTER — Encounter: Payer: Self-pay | Admitting: Internal Medicine

## 2015-08-20 ENCOUNTER — Ambulatory Visit (HOSPITAL_COMMUNITY)
Admission: RE | Admit: 2015-08-20 | Discharge: 2015-08-20 | Disposition: A | Discharge status: Home / Self Care | Payer: Commercial Managed Care - HMO | Source: Ambulatory Visit | Attending: Internal Medicine | Admitting: Internal Medicine

## 2015-08-20 VITALS — BP 138/80 | HR 66 | Temp 97.6°F | Ht 70.0 in | Wt 204.9 lb

## 2015-08-20 DIAGNOSIS — I10 Essential (primary) hypertension: Secondary | ICD-10-CM

## 2015-08-20 DIAGNOSIS — F17200 Nicotine dependence, unspecified, uncomplicated: Secondary | ICD-10-CM

## 2015-08-20 DIAGNOSIS — R079 Chest pain, unspecified: Secondary | ICD-10-CM

## 2015-08-20 DIAGNOSIS — R9431 Abnormal electrocardiogram [ECG] [EKG]: Secondary | ICD-10-CM | POA: Diagnosis not present

## 2015-08-20 DIAGNOSIS — Z720 Tobacco use: Secondary | ICD-10-CM

## 2015-08-20 DIAGNOSIS — E118 Type 2 diabetes mellitus with unspecified complications: Secondary | ICD-10-CM

## 2015-08-20 DIAGNOSIS — M069 Rheumatoid arthritis, unspecified: Secondary | ICD-10-CM

## 2015-08-20 LAB — GLUCOSE, CAPILLARY: GLUCOSE-CAPILLARY: 259 mg/dL — AB (ref 65–99)

## 2015-08-20 MED ORDER — INSULIN NPH (HUMAN) (ISOPHANE) 100 UNIT/ML ~~LOC~~ SUSP: SUBCUTANEOUS | Status: DC

## 2015-08-20 MED ORDER — METFORMIN HCL 1000 MG PO TABS: 1000.0000 mg | ORAL_TABLET | Freq: Two times a day (BID) | ORAL | Status: DC

## 2015-08-20 MED ORDER — HYDROCODONE-ACETAMINOPHEN 7.5-325 MG PO TABS: 1.0000 | ORAL_TABLET | Freq: Four times a day (QID) | ORAL | Status: DC | PRN

## 2015-08-20 NOTE — Assessment & Plan Note (Signed)
-   Rash is resolving off methotrexate - Patient to follow with rheumatology next week - No further w/u for now

## 2015-08-20 NOTE — Assessment & Plan Note (Signed)
Lab Results  Component Value Date   HGBA1C 9.6 08/12/2015   HGBA1C 9.8 04/30/2015   HGBA1C canceled 04/30/2015     Assessment: Diabetes control:  uncontrolled Progress toward A1C goal:   mildly improved Comments: patient compliant with meds. Avg blood sugar 230s on meter  Plan: Medications:  Will increase NPH to 17 units BID and change from humulin N to novolin N per change in insurance coverage Home glucose monitoring: Frequency:   Timing:   Instruction/counseling given: reminded to bring blood glucose meter & log to each visit, discussed foot care and discussed the need for weight loss Educational resources provided:   Self management tools provided:   Other plans: Patient to join silver sneaker program after seeing cardiologist and will try and exercise more

## 2015-08-20 NOTE — Assessment & Plan Note (Signed)
BP Readings from Last 3 Encounters:  08/20/15 138/80  08/12/15 126/71  04/30/15 123/76    Lab Results  Component Value Date   NA 137 11/26/2014   K 4.1 11/26/2014   CREATININE 0.84 11/26/2014    Assessment: Blood pressure control:  well controlled Progress toward BP goal:   at goal Comments: compliant with meds  Plan: Medications:  continue current medications Educational resources provided:   Self management tools provided:   Other plans: patient to enter silver sneaker program in an attempt to lose weight. Will c/w amlodipine, HCTA and lisinopril

## 2015-08-20 NOTE — Assessment & Plan Note (Signed)
-   Still smokes 1 PPD - Smoking cessation strongly encouraged - He states he will try and cut back and talk to his work about their smoking cessation program - Will discuss again on next visit

## 2015-08-20 NOTE — Assessment & Plan Note (Signed)
-   Patient with episode of CP - 4 days, constant, substernal, not immediately resolved with NTG, felt better after burping and passing gas - Repeat EKG done here with no acute ST/T wave changes - CP now completely resolved. Encouraged patient to call 911 if recurs - To f/u with cardio in the next 2 weeks for further assessment and possible stress test - Smoking cessation strongly encouraged - It is possible that this episode may be related to a GI etiology and is atypical for cardiac pain but will refer to cardio

## 2015-08-20 NOTE — Progress Notes (Signed)
   Subjective:    Patient ID: Riley Jones, male    DOB: [birth year], 59 y.o.   MRN: [id number]  HPI Patient here for routine follow up of his DM and HTN.   He was seen in the clinic last week for rash after initiating methotrexate. He was told to stop medication and rash has since been improving. He also had associated diarrhea. Even after stopping methotrexate he had diarrhea last week- 17th to the 20th but has resolved since 2 days.  Of note he also complains of CP - substernal, constant, mild- moderate intensity, "smothering" nature, with associated left arm pain. CP resolved 2 days ago and has had no recurrent episodes since. No palpitations, no SOB, mild diaphoresis, no n/v. He took SL NTG which did not immediately relieve the pain. He said that he burped a lot on Sunday and thinks this helped relieve the pain. No fevers  He also has persistently elevated A1C - 9.6 on last visit. Blood sugars remain elevated on meter- 230s on average. He is compliant with insulin and has been trying to be compliant with diet. Wants to start silver sneakers program at the [facility].      Review of Systems  Constitutional: Positive for diaphoresis. Negative for fever, chills, activity change, appetite change and fatigue.  HENT: Negative.   Eyes: Negative.   Respiratory: Negative.   Cardiovascular: Positive for chest pain. Negative for palpitations and leg swelling.  Gastrointestinal: Positive for diarrhea. Negative for nausea, vomiting, abdominal pain and abdominal distention.  Musculoskeletal: Positive for arthralgias.  Skin: Positive for rash.  Neurological: Negative.   Psychiatric/Behavioral: Negative.        Objective:   Physical Exam  Constitutional: He is oriented to person, place, and time. He appears well-developed and well-nourished.  HENT:  Head: Normocephalic and atraumatic.  Cardiovascular: Normal rate, regular rhythm and normal heart sounds.   Pulmonary/Chest: Effort normal and breath  sounds normal. No respiratory distress. He has no wheezes.  Abdominal: Soft. Bowel sounds are normal. He exhibits no distension. There is no tenderness.  Musculoskeletal: Normal range of motion. He exhibits no edema or tenderness.  Neurological: He is alert and oriented to person, place, and time.  Skin: Skin is warm and dry.  Resolving erythematous rash over anterior abdomen  Psychiatric: He has a normal mood and affect. His behavior is normal.          Assessment & Plan:  Please see problem based charting for assessment and plan:

## 2015-08-20 NOTE — Patient Instructions (Addendum)
-   It was a pleasure seeing you today  - We will check an EKG today and try and get you an earlier appointment with cardiology - I encourage you to join the silver sneaker program after first seeing your cardiologist - BP is well controlled. We will continue your current medications - Your rash is resolving. Please follow up with rheumatology for alternative regimen to methotrexate - Your DM is still uncontrolled. Please continue with your diet and exercise - We will change your insulin to humulin N and increase dose to 17 units twice a day

## 2015-08-28 DIAGNOSIS — Z720 Tobacco use: Secondary | ICD-10-CM | POA: Diagnosis not present

## 2015-08-28 DIAGNOSIS — J449 Chronic obstructive pulmonary disease, unspecified: Secondary | ICD-10-CM | POA: Diagnosis not present

## 2015-08-28 DIAGNOSIS — E1065 Type 1 diabetes mellitus with hyperglycemia: Secondary | ICD-10-CM | POA: Diagnosis not present

## 2015-08-28 DIAGNOSIS — Z21 Asymptomatic human immunodeficiency virus [HIV] infection status: Secondary | ICD-10-CM | POA: Diagnosis not present

## 2015-08-28 DIAGNOSIS — M4120 Other idiopathic scoliosis, site unspecified: Secondary | ICD-10-CM | POA: Diagnosis not present

## 2015-08-28 DIAGNOSIS — M069 Rheumatoid arthritis, unspecified: Secondary | ICD-10-CM | POA: Diagnosis not present

## 2015-08-28 DIAGNOSIS — I25110 Atherosclerotic heart disease of native coronary artery with unstable angina pectoris: Secondary | ICD-10-CM | POA: Diagnosis not present

## 2015-09-02 ENCOUNTER — Other Ambulatory Visit: Payer: Self-pay | Admitting: Internal Medicine

## 2015-09-02 NOTE — Progress Notes (Signed)
Cardiology Office Note   Date:  09/03/2015   ID:  Riley Jones, DOB 06-15-1956, MRN [id number]  PCP:  Aldine Contes, MD  Cardiologist:  Dr. Johnsie Cancel   Chest pain    History of Present Illness:  Riley Jones is a 59 y.o. male with a history of CAD s/p DES to mLAD (06/2014), DMT2, HLD, HIV, COPD, RA, continued tobacco abuse who presents to clinic for recent episodes of chest pain. Riley Jones Dr. Johnsie Cancel in March. He had some atypical chest pain in 11/2014 related to anxiety and food. F/u myoview (12/07/14) normal.  Seen by PCP on 08/20/15 and complained of chest pain. He was referred back to our clinic for closer cardiology follow up.  He had chest pain x4 days about a month ago that felt like a bubble in his chest that was very severe. He took 1 SL NTG with no relief. His took some pepobismol and gas X and that seemed to help. Since that time he has had some mild chest pressure since that time. None currently. Last episode a few days ago. CP was associated with some mild nausea and SOB, but he has chronic SOB due to COPD and heavy tobacco abuse. He is trying to quit smoking. He has cut down and was able to quit for 12 daysbut restarted again down to just under a pack per day. He used to smoke ~4 PPD.   No lower extremity edema, PND or orthopnea. No palpations or passing out. No blood in stool or urine.  Complaint with all his medications. He is very active walking at work and exercised with silver sneakers yesterday with no CP.     Studies/Reports Reviewed Today:  Myoview 12/07/14 Normal stress nuclear study. No evidence of ischemia.LVEF: 57%.  CXR 11/26/14 IMPRESSION: No active cardiopulmonary disease. Stable chronic bronchitic changes. Degenerative changes thoracic spine.  Cardiac Cath/PCI 06/2014 LM: Normal LAD: 40-50% proximal 80% mid at D2 take off shelf like lesion LCx: normal RCA: Normal PDA: 30% proximal EF 55-65%, there is no significant mitral  regurgitation  PCI: 3.0 x 15 mm Xience alpine DES to Mid LAD  Past Medical History  Diagnosis Date  . Hypertension     Well Controlled off meds  . HIV (human immunodeficiency virus infection) ([place]) 1995    DX after shingles followed by Dr. Ola Spurr ([place])  . Hyperlipidemia   . Atypical angina ([place])     Myoview 2003, Normal EF 64%  . Foot pain 03/30/2011  . COPD (chronic obstructive pulmonary disease) ([place])     "mild" (07/17/2014)  . Asthma     "mild" (07/17/2014)  . Pneumonia 4-5 times  . Sleep apnea     does not wear mask (07/17/2014)  . Type II diabetes mellitus ([place])   . GERD (gastroesophageal reflux disease)   . H/O hiatal hernia   . Headache     "weekly" (06/2014)  . Rheumatoid arthritis(714.0)     Followed by Dr. Charlestine Night, off MTX and prdnisone since 2007  . Arthritis     "pretty much all over"  . History of gout   . Anxiety   . Depression   . Kidney stones     "passed them all"  . CAD (coronary artery disease)     a. 10/20 DES to 80% mid LAD  . Hx of cardiovascular stress test     ETT-Myoview (3/16):  Ex 10:15, No Ischemia, EF 57%; Normal Study    Past Surgical  History  Procedure Laterality Date  . Uvulopalatopharyngoplasty (uppp)/tonsillectomy/septoplasty  1990's  . Coronary angioplasty with stent placement  07/17/2014    a. DES to 80% mid LAD  . Tonsillectomy and adenoidectomy  1990's  . Appendectomy  ~ 1981  . Carpal tunnel release Left 1990's  . Trigger finger release Left 1990's    2nd & 5th digits  . Hernia repair    . Umbilical hernia repair  1980's    w/mesh  . Laparoscopic incisional / umbilical / ventral hernia repair  1990's    "it was a double; not inguinal"  . Left heart catheterization with coronary angiogram N/A 07/17/2014    Procedure: LEFT HEART CATHETERIZATION WITH CORONARY ANGIOGRAM;  Surgeon: [name], MD;  Location: [facility] CATH LAB;  Service: Cardiovascular;  Laterality: N/A;  . Percutaneous stent intervention  07/17/2014     Procedure: PERCUTANEOUS STENT INTERVENTION;  Surgeon: [name], MD;  Location: [facility] CATH LAB;  Service: Cardiovascular;;     Current Outpatient Prescriptions  Medication Sig Dispense Refill  . ACCU-CHEK FASTCLIX LANCETS MISC Check BS 4 times a day 102 each 11  . albuterol (VENTOLIN HFA) 108 (90 BASE) MCG/ACT inhaler Inhale 1 puff into the lungs every 4 (four) hours as needed. for shortness of breath. Or may use every 6 hours as needed.    . ALPRAZolam (XANAX) 0.5 MG tablet TAKE ONE TABLET BY MOUTH AT BEDTIME AS NEEDED 30 tablet 0  . amLODipine (NORVASC) 10 MG tablet TAKE ONE TABLET BY MOUTH ONCE DAILY. 31 tablet 11  . aspirin 81 MG EC tablet Take 81 mg by mouth daily.      . ATRIPLA 600-200-300 MG tablet TAKE 1 TABLET BY MOUTH DAILY 30 tablet 1  . Blood Glucose Monitoring Suppl (ACCU-CHEK NANO SMARTVIEW) W/DEVICE KIT Check blood sugars 4 times a day 1 kit 0  . clopidogrel (PLAVIX) 75 MG tablet Take 1 tablet (75 mg total) by mouth daily. 90 tablet 3  . EQ LORATADINE 10 MG tablet TAKE ONE TABLET BY MOUTH ONCE DAILY 30 tablet 11  . folic acid (FOLVITE) 1 MG tablet Take 1 tablet (1 mg total) by mouth daily. 100 tablet 3  . glucose blood (ACCU-CHEK SMARTVIEW) test strip Check BS 4 times a day 150 each 12  . hydrochlorothiazide (HYDRODIURIL) 25 MG tablet TAKE ONE TABLET BY MOUTH ONCE DAILY 30 tablet 11  . HYDROcodone-acetaminophen (NORCO) 7.5-325 MG tablet Take 1 tablet by mouth every 6 (six) hours as needed for moderate pain. 60 tablet 0  . insulin NPH Human (NOVOLIN N) 100 UNIT/ML injection Inject 17 units into the skin 2 times a day before meals 10 mL 3  . Insulin Syringe-Needle U-100 (B-D INS SYRINGE 0.5CC/30GX1/2") 30G X 1/2" 0.5 ML MISC Inject insulin twice a day before meals 100 each 3  . leflunomide (ARAVA) 20 MG tablet Take 20 mg by mouth daily.    Marland Kitchen lisinopril (PRINIVIL,ZESTRIL) 10 MG tablet TAKE ONE TABLET BY MOUTH DAILY. 90 tablet 2  . metFORMIN (GLUCOPHAGE) 1000 MG tablet Take 1  tablet (1,000 mg total) by mouth 2 (two) times daily with a meal. 180 tablet 3  . Multiple Vitamin (MULTIVITAMIN WITH MINERALS) TABS Take 1 tablet by mouth daily.    . nitroGLYCERIN (NITROSTAT) 0.4 MG SL tablet Place 0.4 mg under the tongue every 5 (five) minutes as needed for chest pain (x 3 pills daily).    Marland Kitchen omeprazole (PRILOSEC) 20 MG capsule TAKE ONE CAPSULE BY MOUTH EVERY DAY 30  capsule 3  . pravastatin (PRAVACHOL) 40 MG tablet TAKE ONE TABLET BY MOUTH AT BEDTIME 90 tablet 3  . terazosin (HYTRIN) 2 MG capsule TAKE TWO CAPSULES BY MOUTH AT BEDTIME 180 capsule 3   No current facility-administered medications for this visit.    Allergies:   Penicillins; Zoloft; and Chantix    Social History:  The patient  reports that he has been smoking Cigarettes.  He has a 46 pack-year smoking history. He has never used smokeless tobacco. He reports that he drinks about 0.6 oz of alcohol per week. He reports that he does not use illicit drugs.   Family History:  The patient's family history includes Heart attack in his brother and father; Heart disease in his father; Osteoporosis in his mother; Stroke in his maternal grandfather, maternal grandmother, paternal grandfather, and paternal grandmother.    ROS:  Please see the history of present illness.   Otherwise, review of systems are positive for NONE.   All other systems are reviewed and negative.    PHYSICAL EXAM: VS:  BP 96/62 mmHg  Pulse 77  Ht _0  (1.778 m)  Wt 201 lb 9.6 oz (91.445 kg)  BMI 28.93 kg/m2 , BMI Body mass index is 28.93 kg/(m^2). GEN: Well nourished, well developed, in no acute distress HEENT: normal Neck: no JVD, carotid bruits, or masses Cardiac:RRR; no murmurs, rubs, or gallops,no edema  Respiratory:  clear to auscultation bilaterally, normal work of breathing GI: soft, nontender, nondistended, + BS MS: no deformity or atrophy Skin: warm and dry, no rash Neuro:  Strength and sensation are intact Psych: euthymic mood,  full affect   EKG:  EKG is ordered today. The ekg ordered today demonstrates sinus brady (HR 59), IRBBB, TWI in v3-v6 similar to previous.    Recent Labs: 11/26/2014: ALT 46; BUN 10; Creat 0.84; Hemoglobin 15.3; Platelets 190; Potassium 4.1; Sodium 137    Lipid Panel    Component Value Date/Time   CHOL 178 11/26/2014 1701   TRIG 231* 11/26/2014 1701   HDL 47 11/26/2014 1701   CHOLHDL 3.8 11/26/2014 1701   VLDL 46* 11/26/2014 1701   LDLCALC 85 11/26/2014 1701      Wt Readings from Last 3 Encounters:  09/03/15 201 lb 9.6 oz (91.445 kg)  08/20/15 204 lb 14.4 oz (92.942 kg)  08/12/15 200 lb 12.8 oz (91.082 kg)      ASSESSMENT AND PLAN:  Riley Jones is a 59 y.o. male with a history of CAD s/p DES to mLAD (06/2014), DMT2, HLD, HIV, COPD, RA, continued tobacco abuse who presents to clinic for recent episodes of chest pain.   CAD: Recent myoview (12/07/14) low risk. No further CP. Able to exercise with no sx. ECG today with no acute ST or TW changes.  --Continue aspirin/plavix, amlodipine 25m qd, pravastatin, lisinopril. No BB with bradycardia and COPD -- Will not make any changes today. I have asked him to go to the ER if he has any further prolonged/severe episodes of CP.   HTN: Controlled.   Hyperlipidemia: Continue statin. Recent LDL 85.Continue statin.   COPD: FU with PCP. CXR this year ok   Tobacco Abuse: He is trying to quit. He has cut down and was able to quit for 12 daysbut restarted again  HIV CD 4 counts fine continue 2 drug Rx f/u [facility]   DMT2- HgA1c 9.6. Continue to follow with PCP  RA- had diarrhea and rash with MTX. Continue to follow with PCP  Current medicines are reviewed at length with the patient today.  The patient does not have concerns regarding medicines.  The following changes have been made:  no change  Labs/ tests ordered today include:  No orders of the defined types were placed in this encounter.      Disposition:   FU with Dr. Johnsie Cancel in 3 months.   Renea Ee  09/03/2015 10:27 AM    [facility] [address], [place], [place]  [address] Phone: [phone]; Fax: [phone]

## 2015-09-03 ENCOUNTER — Ambulatory Visit (INDEPENDENT_AMBULATORY_CARE_PROVIDER_SITE_OTHER): Payer: Commercial Managed Care - HMO | Admitting: Physician Assistant

## 2015-09-03 ENCOUNTER — Encounter: Payer: Self-pay | Admitting: Physician Assistant

## 2015-09-03 ENCOUNTER — Other Ambulatory Visit: Payer: Commercial Managed Care - HMO

## 2015-09-03 VITALS — BP 96/62 | HR 77 | Ht 70.0 in | Wt 201.6 lb

## 2015-09-03 DIAGNOSIS — B20 Human immunodeficiency virus [HIV] disease: Secondary | ICD-10-CM | POA: Diagnosis not present

## 2015-09-03 DIAGNOSIS — R079 Chest pain, unspecified: Secondary | ICD-10-CM | POA: Diagnosis not present

## 2015-09-03 LAB — COMPREHENSIVE METABOLIC PANEL
ALBUMIN: 4.4 g/dL (ref 3.6–5.1)
ALK PHOS: 82 U/L (ref 40–115)
ALT: 56 U/L — ABNORMAL HIGH (ref 9–46)
AST: 39 U/L — ABNORMAL HIGH (ref 10–35)
BUN: 11 mg/dL (ref 7–25)
CALCIUM: 9.9 mg/dL (ref 8.6–10.3)
CO2: 28 mmol/L (ref 20–31)
Chloride: 99 mmol/L (ref 98–110)
Creat: 0.85 mg/dL (ref 0.70–1.25)
GLUCOSE: 215 mg/dL — AB (ref 65–99)
POTASSIUM: 4.5 mmol/L (ref 3.5–5.3)
Sodium: 136 mmol/L (ref 135–146)
Total Bilirubin: 0.6 mg/dL (ref 0.2–1.2)
Total Protein: 6.9 g/dL (ref 6.1–8.1)

## 2015-09-03 LAB — CBC
HEMATOCRIT: 44 % (ref 39.0–52.0)
HEMOGLOBIN: 15.9 g/dL (ref 13.0–17.0)
MCH: 33.3 pg (ref 26.0–34.0)
MCHC: 36.1 g/dL — AB (ref 30.0–36.0)
MCV: 92.2 fL (ref 78.0–100.0)
MPV: 9.9 fL (ref 8.6–12.4)
Platelets: 126 10*3/uL — ABNORMAL LOW (ref 150–400)
RBC: 4.77 MIL/uL (ref 4.22–5.81)
RDW: 13.7 % (ref 11.5–15.5)
WBC: 7.4 10*3/uL (ref 4.0–10.5)

## 2015-09-03 NOTE — Patient Instructions (Addendum)
Medication Instructions:  Your physician recommends that you continue on your current medications as directed. Please refer to the Current Medication list given to you today.   Labwork: None ordered  Testing/Procedures:   Follow-Up: Your physician recommends that you schedule a follow-up appointment in: 3 MONTHS WITH DR. NISHAN   Any Other Special Instructions Will Be Listed Below (If Applicable).     If you need a refill on your cardiac medications before your next appointment, please call your pharmacy.   

## 2015-09-04 LAB — HIV-1 RNA QUANT-NO REFLEX-BLD: HIV 1 RNA Quant: <20 copies/mL (ref <20)

## 2015-09-04 LAB — T-HELPER CELL (CD4) - (RCID CLINIC ONLY)
CD4 % Helper T Cell: 45 % (ref 33–55)
CD4 T CELL ABS: 710 /uL (ref 400–2700)

## 2015-09-16 ENCOUNTER — Other Ambulatory Visit: Payer: Self-pay | Admitting: Internal Medicine

## 2015-09-16 ENCOUNTER — Ambulatory Visit (INDEPENDENT_AMBULATORY_CARE_PROVIDER_SITE_OTHER): Payer: Commercial Managed Care - HMO | Admitting: Infectious Diseases

## 2015-09-16 ENCOUNTER — Other Ambulatory Visit: Payer: Self-pay | Admitting: Infectious Diseases

## 2015-09-16 ENCOUNTER — Encounter: Payer: Self-pay | Admitting: Infectious Diseases

## 2015-09-16 VITALS — BP 136/75 | HR 58 | Temp 97.7°F | Ht 70.0 in | Wt 201.0 lb

## 2015-09-16 DIAGNOSIS — L270 Generalized skin eruption due to drugs and medicaments taken internally: Secondary | ICD-10-CM | POA: Insufficient documentation

## 2015-09-16 DIAGNOSIS — F17200 Nicotine dependence, unspecified, uncomplicated: Secondary | ICD-10-CM | POA: Diagnosis not present

## 2015-09-16 DIAGNOSIS — E7800 Pure hypercholesterolemia, unspecified: Secondary | ICD-10-CM

## 2015-09-16 DIAGNOSIS — B20 Human immunodeficiency virus [HIV] disease: Secondary | ICD-10-CM | POA: Diagnosis not present

## 2015-09-16 DIAGNOSIS — Z113 Encounter for screening for infections with a predominantly sexual mode of transmission: Secondary | ICD-10-CM

## 2015-09-16 DIAGNOSIS — I200 Unstable angina: Secondary | ICD-10-CM | POA: Diagnosis not present

## 2015-09-16 DIAGNOSIS — R21 Rash and other nonspecific skin eruption: Secondary | ICD-10-CM

## 2015-09-16 MED ORDER — HYDROCODONE-ACETAMINOPHEN 7.5-325 MG PO TABS: 1.0000 | ORAL_TABLET | Freq: Four times a day (QID) | ORAL | Status: DC | PRN

## 2015-09-16 MED ORDER — CLOTRIMAZOLE-BETAMETHASONE 1-0.05 % EX LOTN: TOPICAL_LOTION | Freq: Two times a day (BID) | CUTANEOUS | Status: DC

## 2015-09-16 NOTE — Assessment & Plan Note (Signed)
Urged to quit 

## 2015-09-16 NOTE — Telephone Encounter (Signed)
Last refill 11/22 at [facility]  UDS 12/19/14 Future apt 2/28   Xanax from 12/7 never called in, now phoned to [facility] on [address]

## 2015-09-16 NOTE — Telephone Encounter (Signed)
rx ready for pick-up, pt informed

## 2015-09-16 NOTE — Assessment & Plan Note (Signed)
Lab Results  Component Value Date   CHOL 178 11/26/2014   HDL 47 11/26/2014   LDLCALC 85 11/26/2014   TRIG 231* 11/26/2014   CHOLHDL 3.8 11/26/2014    Doing well.

## 2015-09-16 NOTE — Assessment & Plan Note (Signed)
Has had CV f/u. Appreciate their input.

## 2015-09-16 NOTE — Assessment & Plan Note (Signed)
He is doing well, offered to change his ART. He is considering.  Needs to stop flonase (currently out), will interact with his art.  Offered condoms, not sexually active Will see him back in 6 months Has gotten flu shot.

## 2015-09-16 NOTE — Assessment & Plan Note (Signed)
Sounds like candida, his rx is refilled.  Offered to have him seen by allergy with his pruritis, he defers.

## 2015-09-16 NOTE — Progress Notes (Signed)
   Subjective:    Patient ID: Riley Jones, male    DOB: [birth year], 59 y.o.   MRN: [id number]  HPI 59 yo M with hx of HIV+ (~60yrs), DM2 (>68yrs, on insulin) and RA (took MTX without relief, off prednisone). He has been maintained on atripla (has been on multiple previous meds).  Also hx of smoking. CAD s/p DES to mid LAD in 06/2014, his EF 55-65% at that time. He had a myoview 11-2014- low risk.  He was recently seen by CV for continued episodes of CP. His medications were unchanged. His ECG was "good" per him.  Finisihng course of doxy for his sinus infection.  Has had a "rough fall" with sinus infections, flares of arthritis. Feels like he has bugs on him, itchy. Wants to know if his benadryl is still good.   Last A1C was "9.something". Has been having rashes, thinks it may be his insulin. No new soaps or shampoos.  Has occas episodes of intertriginous rash. Uses topical for this with good relief.  Has joined the [employer] has had colon, has had ophtho this year.   HIV 1 RNA QUANT (copies/mL)  Date Value  09/03/2015 <20  11/26/2014 <20  05/30/2014 <20   CD4 T CELL ABS (/uL)  Date Value  09/03/2015 710  11/26/2014 1060  05/30/2014 760    Review of Systems  Constitutional: Negative for appetite change and unexpected weight change.  HENT: Positive for postnasal drip, rhinorrhea and sinus pressure.   Respiratory: Positive for cough.   Gastrointestinal: Negative for constipation and blood in stool.  Genitourinary: Negative for difficulty urinating.  Musculoskeletal: Positive for arthralgias.  Skin: Positive for rash.  Neurological: Positive for numbness.       Objective:   Physical Exam  Constitutional: He appears well-developed and well-nourished.  HENT:  Mouth/Throat: No oropharyngeal exudate.  Eyes: EOM are normal. Pupils are equal, round, and reactive to light.  Neck: Neck supple.  Cardiovascular: Normal rate, regular rhythm and normal heart sounds.   Pulmonary/Chest:  Effort normal and breath sounds normal.  Abdominal: Soft. Bowel sounds are normal. There is no tenderness. There is no rebound.  Musculoskeletal: He exhibits no edema.  His feet are clean, no ulcers, normal light touch.   Lymphadenopathy:    He has no cervical adenopathy.  Neurological:  Normal light touch      Assessment & Plan:

## 2015-10-06 ENCOUNTER — Other Ambulatory Visit: Payer: Self-pay | Admitting: Internal Medicine

## 2015-10-08 ENCOUNTER — Ambulatory Visit: Payer: Commercial Managed Care - HMO | Admitting: Cardiovascular Disease

## 2015-10-13 ENCOUNTER — Other Ambulatory Visit: Payer: Self-pay | Admitting: Internal Medicine

## 2015-10-15 ENCOUNTER — Other Ambulatory Visit: Payer: Self-pay | Admitting: Internal Medicine

## 2015-10-15 NOTE — Telephone Encounter (Signed)
Last refill 12/19 Last OV 11/22 UDS 3/23

## 2015-10-16 MED ORDER — HYDROCODONE-ACETAMINOPHEN 7.5-325 MG PO TABS: 1.0000 | ORAL_TABLET | Freq: Four times a day (QID) | ORAL | Status: DC | PRN

## 2015-10-17 NOTE — Telephone Encounter (Signed)
Pt informed

## 2015-10-19 ENCOUNTER — Other Ambulatory Visit: Payer: Self-pay | Admitting: Internal Medicine

## 2015-10-21 ENCOUNTER — Telehealth: Payer: Self-pay | Admitting: *Deleted

## 2015-10-21 MED ORDER — INSULIN NPH (HUMAN) (ISOPHANE) 100 UNIT/ML ~~LOC~~ SUSP: SUBCUTANEOUS | Status: DC

## 2015-10-21 NOTE — Telephone Encounter (Signed)
Patient is already on novolin N per his medication list. I filled this on 08/20/2015 with 3 refills. Does he need refills?

## 2015-10-21 NOTE — Telephone Encounter (Signed)
WALK-IN:   Pt here in clinic and presents paperwork from [facility] stating that as of September 29, 2015 they will no longer cover  Humulin N 100unit/ml vials. Insurance is requesting MD change rx to the preferred Novolin N suspension.  Will forward request to pt's pcp for review.  Please advise.Regenia Skeeter, Darlene Cassady1/23/201710:06 AM

## 2015-10-21 NOTE — Telephone Encounter (Signed)
Last refill 12/7 Next appointment 2/28

## 2015-10-22 ENCOUNTER — Other Ambulatory Visit: Payer: Self-pay | Admitting: Internal Medicine

## 2015-10-22 NOTE — Telephone Encounter (Signed)
rx phoned into [facility]

## 2015-11-03 ENCOUNTER — Other Ambulatory Visit: Payer: Self-pay | Admitting: Internal Medicine

## 2015-11-07 ENCOUNTER — Other Ambulatory Visit: Payer: Self-pay | Admitting: Internal Medicine

## 2015-11-08 NOTE — Telephone Encounter (Signed)
To local pharmacy?  I can call [facility] and have them transfer it

## 2015-11-08 NOTE — Telephone Encounter (Signed)
Looks like we sent to [facility] [employer] and its [facility] local requesting.  Is it too dif to just send updated on eto local pharm?

## 2015-11-08 NOTE — Telephone Encounter (Signed)
Just refilled in December with a year supply.  He should call pharmacy or does pharmacy need new Rx?   Thx

## 2015-11-08 NOTE — Telephone Encounter (Signed)
Spoke w/ pt, he will have pharm transfer script to correct pharm

## 2015-11-19 ENCOUNTER — Telehealth: Payer: Self-pay | Admitting: Internal Medicine

## 2015-11-19 DIAGNOSIS — G894 Chronic pain syndrome: Secondary | ICD-10-CM

## 2015-11-19 MED ORDER — HYDROCODONE-ACETAMINOPHEN 7.5-325 MG PO TABS: 1.0000 | ORAL_TABLET | Freq: Four times a day (QID) | ORAL | Status: DC | PRN

## 2015-11-19 NOTE — Telephone Encounter (Signed)
last fill 1/8 Next appt 2/28 Last uds 11/2014

## 2015-11-19 NOTE — Telephone Encounter (Signed)
Called, lm for rtc 

## 2015-11-21 ENCOUNTER — Ambulatory Visit: Payer: Commercial Managed Care - HMO

## 2015-11-26 ENCOUNTER — Ambulatory Visit (INDEPENDENT_AMBULATORY_CARE_PROVIDER_SITE_OTHER): Payer: Commercial Managed Care - HMO | Admitting: Internal Medicine

## 2015-11-26 ENCOUNTER — Encounter: Payer: Self-pay | Admitting: Internal Medicine

## 2015-11-26 VITALS — BP 124/64 | HR 62 | Temp 97.5°F | Ht 70.0 in | Wt 197.2 lb

## 2015-11-26 DIAGNOSIS — M069 Rheumatoid arthritis, unspecified: Secondary | ICD-10-CM

## 2015-11-26 DIAGNOSIS — E119 Type 2 diabetes mellitus without complications: Secondary | ICD-10-CM | POA: Diagnosis not present

## 2015-11-26 DIAGNOSIS — I10 Essential (primary) hypertension: Secondary | ICD-10-CM

## 2015-11-26 DIAGNOSIS — Z21 Asymptomatic human immunodeficiency virus [HIV] infection status: Secondary | ICD-10-CM

## 2015-11-26 DIAGNOSIS — F411 Generalized anxiety disorder: Secondary | ICD-10-CM

## 2015-11-26 DIAGNOSIS — I110 Hypertensive heart disease with heart failure: Secondary | ICD-10-CM

## 2015-11-26 DIAGNOSIS — B20 Human immunodeficiency virus [HIV] disease: Secondary | ICD-10-CM

## 2015-11-26 DIAGNOSIS — I011 Acute rheumatic endocarditis: Secondary | ICD-10-CM

## 2015-11-26 DIAGNOSIS — I200 Unstable angina: Secondary | ICD-10-CM

## 2015-11-26 DIAGNOSIS — Z794 Long term (current) use of insulin: Secondary | ICD-10-CM

## 2015-11-26 DIAGNOSIS — E118 Type 2 diabetes mellitus with unspecified complications: Secondary | ICD-10-CM

## 2015-11-26 LAB — POCT GLYCOSYLATED HEMOGLOBIN (HGB A1C): HEMOGLOBIN A1C: 8.9

## 2015-11-26 LAB — GLUCOSE, CAPILLARY: GLUCOSE-CAPILLARY: 203 mg/dL — AB (ref 65–99)

## 2015-11-26 MED ORDER — DULOXETINE HCL 30 MG PO CPEP: 30.0000 mg | ORAL_CAPSULE | Freq: Every day | ORAL | Status: DC

## 2015-11-26 MED ORDER — INSULIN NPH (HUMAN) (ISOPHANE) 100 UNIT/ML ~~LOC~~ SUSP
SUBCUTANEOUS | Status: DC
Start: 2015-11-26 — End: 2015-12-16

## 2015-11-26 MED ORDER — DULOXETINE HCL 20 MG PO CPEP: 20.0000 mg | ORAL_CAPSULE | Freq: Every day | ORAL | Status: DC

## 2015-11-26 NOTE — Assessment & Plan Note (Signed)
-   Follows with Dr. Johnnye Sima - ID input much appreciated - He is doing well on his regimen

## 2015-11-26 NOTE — Assessment & Plan Note (Signed)
BP Readings from Last 3 Encounters:  11/26/15 124/64  09/16/15 136/75  09/03/15 96/62    Lab Results  Component Value Date   NA 136 09/03/2015   K 4.5 09/03/2015   CREATININE 0.85 09/03/2015    Assessment: Blood pressure control:  well controlled  Progress toward BP goal:   at goal Comments: ran out of amlodipine 1 week ago. Compliant with HCTZ, lisinopril and terazosin  Plan: Medications:  Will discontinue amlodipine as BP well controlled off it. Continue with other meds Educational resources provided: brochure Self management tools provided:   Other plans: Patient to maintain BP log at home

## 2015-11-26 NOTE — Patient Instructions (Addendum)
- It was a pleasure seeing you today - We will increase your nighttime insulin to 20 units and keep your daytime insulin at 17 units - Your A1C is better but still need to work on it. Continue with your diet and exercise - We will stop the amlodipine. Please maintain a blood pressure log to monitor your home BP - I have started you on cymbalta and hope it may help your pain - Please follow up in 3 months to monitor your blood sugars  Exercising to Lose Weight Exercising can help you to lose weight. In order to lose weight through exercise, you need to do vigorous-intensity exercise. You can tell that you are exercising with vigorous intensity if you are breathing very hard and fast and cannot hold a conversation while exercising. Moderate-intensity exercise helps to maintain your current weight. You can tell that you are exercising at a moderate level if you have a higher heart rate and faster breathing, but you are still able to hold a conversation. HOW OFTEN SHOULD I EXERCISE? Choose an activity that you enjoy and set realistic goals. Your health care provider can help you to make an activity plan that works for you. Exercise regularly as directed by your health care provider. This may include:  Doing resistance training twice each week, such as:  Push-ups.  Sit-ups.  Lifting weights.  Using resistance bands.  Doing a given intensity of exercise for a given amount of time. Choose from these options:  150 minutes of moderate-intensity exercise every week.  75 minutes of vigorous-intensity exercise every week.  A mix of moderate-intensity and vigorous-intensity exercise every week. Children, pregnant women, people who are out of shape, people who are overweight, and older adults may need to consult a health care provider for individual recommendations. If you have any sort of medical condition, be sure to consult your health care provider before starting a new exercise program. WHAT  ARE SOME ACTIVITIES THAT CAN HELP ME TO LOSE WEIGHT?   Walking at a rate of at least 4.5 miles an hour.  Jogging or running at a rate of 5 miles per hour.  Biking at a rate of at least 10 miles per hour.  Lap swimming.  Roller-skating or in-line skating.  Cross-country skiing.  Vigorous competitive sports, such as football, basketball, and soccer.  Jumping rope.  Aerobic dancing. HOW CAN I BE MORE ACTIVE IN MY DAY-TO-DAY ACTIVITIES?  Use the stairs instead of the elevator.  Take a walk during your lunch break.  If you drive, park your car farther away from work or school.  If you take public transportation, get off one stop early and walk the rest of the way.  Make all of your phone calls while standing up and walking around.  Get up, stretch, and walk around every 30 minutes throughout the day. WHAT GUIDELINES SHOULD I FOLLOW WHILE EXERCISING?  Do not exercise so much that you hurt yourself, feel dizzy, or get very short of breath.  Consult your health care provider prior to starting a new exercise program.  Wear comfortable clothes and shoes with good support.  Drink plenty of water while you exercise to prevent dehydration or heat stroke. Body water is lost during exercise and must be replaced.  Work out until you breathe faster and your heart beats faster.   This information is not intended to replace advice given to you by your health care provider. Make sure you discuss any questions you have with your  health care provider.   Document Released: 10/17/2010 Document Revised: 10/05/2014 Document Reviewed: 02/15/2014 Elsevier Interactive Patient Education [employer].

## 2015-11-26 NOTE — Assessment & Plan Note (Signed)
Lab Results  Component Value Date   HGBA1C 8.9 11/26/2015   HGBA1C 9.6 08/12/2015   HGBA1C 9.8 04/30/2015     Assessment: Diabetes control:  fair Progress toward A1C goal:   improved Comments: compliant with meds  Plan: Medications:  will increase PM novolin N to 20 units and maintain Am novolin N at 17 units Home glucose monitoring: Frequency:   Timing:   Instruction/counseling given: reminded to bring medications to each visit, discussed foot care and discussed diet Educational resources provided: brochure (denied) Self management tools provided: copy of home glucose meter download Other plans: Diabetic foot exam done today

## 2015-11-26 NOTE — Assessment & Plan Note (Signed)
-   has followed up with cardio. Low risk myoview - no recurrent CP - Will c/w current meds - asa, statin and antihypertensives

## 2015-11-26 NOTE — Assessment & Plan Note (Signed)
-   Patient takes alprazolam prn and has been doing well on this - Started him on duloxetine for pain control as well as anxiety - Will eventually titrate him off alprazolam if does well on duloxetine

## 2015-11-26 NOTE — Progress Notes (Signed)
   Subjective:    Patient ID: Riley Jones, male    DOB: May 10, 1956, 60 y.o.   MRN: [id number]  HPI Patient is here for routine follow up of his DM and HTN. Patient also complains of pain in his joints secondary to his RA which is well controlled on current pain regimen. He is complaint with his meds but ran out of his amlodipine 1 week ago.    Review of Systems  Constitutional: Negative.   HENT: Negative.   Eyes: Negative.   Respiratory: Negative.   Cardiovascular: Negative.   Gastrointestinal: Negative.   Musculoskeletal: Positive for arthralgias and gait problem.  Skin: Negative.   Neurological: Negative.   Psychiatric/Behavioral: Negative.        Objective:   Physical Exam  Constitutional: He is oriented to person, place, and time. He appears well-developed and well-nourished.  HENT:  Head: Normocephalic and atraumatic.  Cardiovascular: Normal rate, regular rhythm and normal heart sounds.   Pulmonary/Chest: Effort normal and breath sounds normal. No respiratory distress. He has no wheezes.  Abdominal: Soft. Bowel sounds are normal. He exhibits no distension. There is no tenderness.  Musculoskeletal: Normal range of motion. He exhibits no edema.  Neurological: He is alert and oriented to person, place, and time.  Skin: Skin is warm and dry. No rash noted. No erythema.  Psychiatric: He has a normal mood and affect. His behavior is normal.          Assessment & Plan:  Please see problem based charting for assessment and plan:

## 2015-11-26 NOTE — Assessment & Plan Note (Addendum)
-   Patient is now on leflunomide 20 mg - Following up with rheumatology as an outpatient - Pain is better controlled on this regimen but still requiring narco prn which improves his functionality and allows him to do his daily activities - Pain contract signed today - Will check UDS today

## 2015-11-28 DIAGNOSIS — E1065 Type 1 diabetes mellitus with hyperglycemia: Secondary | ICD-10-CM | POA: Diagnosis not present

## 2015-11-28 DIAGNOSIS — Z720 Tobacco use: Secondary | ICD-10-CM | POA: Diagnosis not present

## 2015-11-28 DIAGNOSIS — Z21 Asymptomatic human immunodeficiency virus [HIV] infection status: Secondary | ICD-10-CM | POA: Diagnosis not present

## 2015-11-28 DIAGNOSIS — M069 Rheumatoid arthritis, unspecified: Secondary | ICD-10-CM | POA: Diagnosis not present

## 2015-11-28 DIAGNOSIS — J449 Chronic obstructive pulmonary disease, unspecified: Secondary | ICD-10-CM | POA: Diagnosis not present

## 2015-11-28 DIAGNOSIS — I25110 Atherosclerotic heart disease of native coronary artery with unstable angina pectoris: Secondary | ICD-10-CM | POA: Diagnosis not present

## 2015-11-28 DIAGNOSIS — M4120 Other idiopathic scoliosis, site unspecified: Secondary | ICD-10-CM | POA: Diagnosis not present

## 2015-12-04 ENCOUNTER — Other Ambulatory Visit: Payer: Self-pay | Admitting: Dietician

## 2015-12-04 ENCOUNTER — Other Ambulatory Visit: Payer: Self-pay | Admitting: Internal Medicine

## 2015-12-04 DIAGNOSIS — E118 Type 2 diabetes mellitus with unspecified complications: Secondary | ICD-10-CM

## 2015-12-04 LAB — TOXASSURE SELECT,+ANTIDEPR,UR: PDF: 0

## 2015-12-05 ENCOUNTER — Telehealth: Payer: Self-pay | Admitting: Internal Medicine

## 2015-12-05 ENCOUNTER — Encounter: Payer: Self-pay | Admitting: Dietician

## 2015-12-05 MED ORDER — "INSULIN SYRINGE-NEEDLE U-100 31G X 15/64"" 0.3 ML MISC": Status: DC

## 2015-12-05 NOTE — Telephone Encounter (Signed)
Called patient to discuss results of negative UDS. Patient only takes meds as needed and states he had not taken it in the days preceding his appointment. Will monitor patient closely and if he continues to have a negative UDS inconsistent with his history of taking meds will stop prescribing them. Patient is in agreement. Will put this information in his FYI.

## 2015-12-05 NOTE — Telephone Encounter (Addendum)
Called patient as part of project trying to help patient's with a1C between 8-9% lower their blood sugars to target Needs shorter syringes- discussed angling the syringe during injections to try to keep it subcutaneous. Patient says he pays 20-40 for his insulin currently and is not opposed to different  Insulin/diabetes medicine because he had a severe low so bad that he passed out in a hotel lobby, dropping everything in his hands.  He asked that Diabetes educator give the information to his doctor for his next visit and mail him eh diabetes testing supply companies that will mail his strips for free.

## 2015-12-05 NOTE — Telephone Encounter (Signed)
Called to pharm 

## 2015-12-05 NOTE — Telephone Encounter (Signed)
Spoke to patient to discuss negative UDS and his explanation of taking the meds only as needed and not taking it prior to his appointment is congruent with his negative UDS, Also the fact that his refills last him longer than a month supports this. Will monitor patient closely

## 2015-12-11 NOTE — Progress Notes (Signed)
Patient ID: Riley Jones, male   DOB: April 10, 1956, 60 y.o.   MRN: [id number]    Cardiology Office Note   Date:  12/16/2015   ID:  Riley Jones, DOB 11-21-1955, MRN [id number]  PCP:  Aldine Contes, MD  Cardiologist:  Dr. Jenkins Rouge     Chief Complaint  Patient presents with  . Coronary Artery Disease    No sx     History of Present Illness: Riley Jones is a 60 y.o. male with a hx of CAD s/p DES to mid LAD in 06/2014, DM2, HL, HIV, COPD, tobacco abuse.  Some atypical chest Pain in March  2016 related to anxiety and food.  F/U myovue reviewed and no ischemia   Studies/Reports Reviewed Today:  Myoview 12/07/14 Normal stress nuclear study. No evidence of ischemia.LVEF: 57%.  CXR 11/26/14 IMPRESSION:  No active cardiopulmonary disease. Stable chronic bronchitic changes. Degenerative changes thoracic spine.  Cardiac Cath/PCI 10/15 LM: Normal LAD:   40-50% proximal 80% mid at D2 take off shelf like lesion LCx: normal RCA: Normal PDA: 30% proximal EF 55-65%, there is no significant mitral regurgitation  PCI:  3.0 x 15 mm Xience alpine DES to Mid LAD  Lab Results  Component Value Date   LDLCALC 85 11/26/2014   Lab Results  Component Value Date   HGBA1C 8.9 11/26/2015   But use to be 10.  Still smoking PPD.  Counseled for less than 10 minutes   Still working at [employer] in [place].  Lonely at times.  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      Past Medical History  Diagnosis Date  . Hypertension     Well Controlled off meds  . HIV (human immunodeficiency virus infection) ([place]) 1995    DX after shingles followed by Dr. Ola Spurr (ID)  . Hyperlipidemia   . Atypical angina ([place])     Myoview 2003, Normal EF 64%  . Foot pain 03/30/2011  . COPD (chronic obstructive pulmonary disease) ([place])     "mild" (07/17/2014)  . Asthma     "mild" (07/17/2014)  . Pneumonia 4-5 times  . Sleep apnea     does not wear mask (07/17/2014)  . Type II diabetes mellitus ([place])    . GERD (gastroesophageal reflux disease)   . H/O hiatal hernia   . Headache     "weekly" (06/2014)  . Rheumatoid arthritis(714.0)     Followed by Dr. Charlestine Night, off MTX and prdnisone since 2007  . Arthritis     "pretty much all over"  . History of gout   . Anxiety   . Depression   . Kidney stones     "passed them all"  . CAD (coronary artery disease)     a. 10/20 DES to 80% mid LAD  . Hx of cardiovascular stress test     ETT-Myoview (3/16):  Ex 10:15, No Ischemia, EF 57%; Normal Study    Past Surgical History  Procedure Laterality Date  . Uvulopalatopharyngoplasty (uppp)/tonsillectomy/septoplasty  1990's  . Coronary angioplasty with stent placement  07/17/2014    a. DES to 80% mid LAD  . Tonsillectomy and adenoidectomy  1990's  . Appendectomy  ~ 1981  . Carpal tunnel release Left 1990's  . Trigger finger release Left 1990's    2nd & 5th digits  . Hernia repair    . Umbilical hernia repair  1980's    w/mesh  . Laparoscopic incisional / umbilical / ventral hernia repair  1990's    "  it was a double; not inguinal"  . Left heart catheterization with coronary angiogram N/A 07/17/2014    Procedure: LEFT HEART CATHETERIZATION WITH CORONARY ANGIOGRAM;  Surgeon: [name], MD;  Location: [facility] CATH LAB;  Service: Cardiovascular;  Laterality: N/A;  . Percutaneous stent intervention  07/17/2014    Procedure: PERCUTANEOUS STENT INTERVENTION;  Surgeon: [name], MD;  Location: [facility] CATH LAB;  Service: Cardiovascular;;     Current Outpatient Prescriptions  Medication Sig Dispense Refill  . albuterol (VENTOLIN HFA) 108 (90 BASE) MCG/ACT inhaler Inhale 1 puff into the lungs every 4 (four) hours as needed. for shortness of breath. Or may use every 6 hours as needed.    . ALPRAZolam (XANAX) 0.5 MG tablet Take 0.5 mg by mouth at bedtime as needed for sleep.    Marland Kitchen aspirin 81 MG EC tablet Take 81 mg by mouth daily.      . ATRIPLA 600-200-300 MG tablet TAKE 1 TABLET BY MOUTH DAILY 30  tablet 6  . clopidogrel (PLAVIX) 75 MG tablet Take 1 tablet (75 mg total) by mouth daily. 90 tablet 3  . DULoxetine (CYMBALTA) 30 MG capsule Take 1 capsule (30 mg total) by mouth daily. 30 capsule 0  . EQ LORATADINE 10 MG tablet TAKE ONE TABLET BY MOUTH ONCE DAILY 30 tablet 11  . hydrochlorothiazide (HYDRODIURIL) 25 MG tablet TAKE ONE TABLET BY MOUTH ONCE DAILY 30 tablet 11  . HYDROcodone-acetaminophen (NORCO) 7.5-325 MG tablet Take 1 tablet by mouth every 6 (six) hours as needed for moderate pain. 60 tablet 0  . insulin regular (NOVOLIN R,HUMULIN R) 100 units/mL injection Inject 17 units subQ in the am & Inject 20 subQ in the pm    . leflunomide (ARAVA) 20 MG tablet Take 20 mg by mouth daily. Reported on 09/16/2015    . lisinopril (PRINIVIL,ZESTRIL) 10 MG tablet TAKE ONE TABLET BY MOUTH DAILY. 90 tablet 2  . metFORMIN (GLUCOPHAGE) 1000 MG tablet TAKE ONE TABLET BY MOUTH TWICE DAILY WITH  A  MEAL 180 tablet 0  . Multiple Vitamin (MULTIVITAMIN WITH MINERALS) TABS Take 1 tablet by mouth daily.    . nitroGLYCERIN (NITROSTAT) 0.4 MG SL tablet Place 0.4 mg under the tongue every 5 (five) minutes as needed for chest pain (x 3 pills daily).    Marland Kitchen omeprazole (PRILOSEC) 20 MG capsule Take 20 mg by mouth every other day.    . pravastatin (PRAVACHOL) 40 MG tablet TAKE ONE TABLET BY MOUTH AT BEDTIME 90 tablet 0  . terazosin (HYTRIN) 2 MG capsule Take 2 mg by mouth at bedtime.     No current facility-administered medications for this visit.    Allergies:   Penicillins; Zoloft; and Chantix    Social History:  The patient  reports that he has been smoking Cigarettes.  He has a 46 pack-year smoking history. He has never used smokeless tobacco. He reports that he drinks about 0.6 oz of alcohol per week. He reports that he does not use illicit drugs.   Family History:  The patient's family history includes Heart attack in his brother and father; Heart disease in his father; Osteoporosis in his mother; Stroke in  his maternal grandfather, maternal grandmother, paternal grandfather, and paternal grandmother.    ROS:   Please see the history of present illness.   Review of Systems  Genitourinary:       Erectile Dysfunction  All other systems reviewed and are negative.    PHYSICAL EXAM: VS:  BP 130/70 mmHg  Pulse 77  Ht 5\' 10"  (1.778 m)  Wt 88.996 kg (196 lb 3.2 oz)  BMI 28.15 kg/m2  SpO2 97%    Wt Readings from Last 3 Encounters:  12/16/15 88.996 kg (196 lb 3.2 oz)  11/26/15 89.449 kg (197 lb 3.2 oz)  09/16/15 91.173 kg (201 lb)     GEN: Well nourished, well developed, in no acute distress HEENT: normal Neck: no JVD, no masses Cardiac:  Normal S1/S2, RRR; no murmur, no rubs or gallops, no edema  Respiratory:  clear to auscultation bilaterally, no wheezing, rhonchi or rales. GI: soft, nontender, nondistended, + BS MS: no deformity or atrophy Skin: warm and dry  Neuro:  CNs II-XII intact, Strength and sensation are intact Psych: Normal affect   EKG:   11/26/14  SR rate 88 nonspecific ST T wave changes   Recent Labs: 09/03/2015: ALT 56*; BUN 11; Creat 0.85; Hemoglobin 15.9; Platelets 126*; Potassium 4.5; Sodium 136    Lipid Panel    Component Value Date/Time   CHOL 178 11/26/2014 1701   TRIG 231* 11/26/2014 1701   HDL 47 11/26/2014 1701   CHOLHDL 3.8 11/26/2014 1701   VLDL 46* 11/26/2014 1701   LDLCALC 85 11/26/2014 1701      ASSESSMENT AND PLAN:  1.  Chest pain:  Resolved.  He thinks it was related to anxiety or his eating habits.  No further recurrence.  Myovue 12/07/14 non ischemic  2.  CAD:  Recent myoview low risk.  No further CP.  Continue aspirin, Effient, amlodipine, pravastatin, lisinopril. 3.  HTN:  Controlled.  4.  Hyperlipidemia:  Continue statin. Recent LDL 85.  We discussed working on diet.  Continue current dose of statin.  5.  COPD:  FU with PCP.  CXR today for cough and smoking last one 10/2014 ok  6.  Tobacco Abuse:  He is trying to quit. Counseled  7/   HIV  CD 4 counts fine continue 2 drug Rx f/u [facility]    Current medicines are reviewed at length with the patient today.  The patient does not have concerns regarding medicines.  The following changes have been made: None.   Labs/ tests ordered today include:  Orders Placed This Encounter  Procedures  . DG Chest 2 View    Disposition:   FU with me in  6 months    Jenkins Rouge

## 2015-12-15 ENCOUNTER — Other Ambulatory Visit: Payer: Self-pay | Admitting: Internal Medicine

## 2015-12-16 ENCOUNTER — Ambulatory Visit
Admission: RE | Admit: 2015-12-16 | Discharge: 2015-12-16 | Disposition: A | Discharge status: Home / Self Care | Payer: Commercial Managed Care - HMO | Source: Ambulatory Visit | Attending: Cardiovascular Disease | Admitting: Cardiovascular Disease

## 2015-12-16 ENCOUNTER — Ambulatory Visit (INDEPENDENT_AMBULATORY_CARE_PROVIDER_SITE_OTHER): Payer: Commercial Managed Care - HMO | Admitting: Cardiovascular Disease

## 2015-12-16 ENCOUNTER — Other Ambulatory Visit: Payer: Self-pay | Admitting: Infectious Diseases

## 2015-12-16 ENCOUNTER — Encounter: Payer: Self-pay | Admitting: Cardiovascular Disease

## 2015-12-16 VITALS — BP 130/70 | HR 77 | Ht 70.0 in | Wt 196.2 lb

## 2015-12-16 DIAGNOSIS — R059 Cough, unspecified: Secondary | ICD-10-CM

## 2015-12-16 DIAGNOSIS — I2510 Atherosclerotic heart disease of native coronary artery without angina pectoris: Secondary | ICD-10-CM | POA: Diagnosis not present

## 2015-12-16 DIAGNOSIS — F17200 Nicotine dependence, unspecified, uncomplicated: Secondary | ICD-10-CM

## 2015-12-16 DIAGNOSIS — I2583 Coronary atherosclerosis due to lipid rich plaque: Principal | ICD-10-CM

## 2015-12-16 DIAGNOSIS — R05 Cough: Secondary | ICD-10-CM | POA: Diagnosis not present

## 2015-12-16 DIAGNOSIS — Z720 Tobacco use: Secondary | ICD-10-CM

## 2015-12-16 NOTE — Patient Instructions (Addendum)
Medication Instructions:  Your physician recommends that you continue on your current medications as directed. Please refer to the Current Medication list given to you today.  Labwork: NONE  Testing/Procedures: A chest x-ray takes a picture of the organs and structures inside the chest, including the heart, lungs, and blood vessels. This test can show several things, including, whether the heart is enlarges; whether fluid is building up in the lungs; and whether pacemaker / defibrillator leads are still in place.   Follow-Up: Your physician wants you to follow-up in: 12 months with Dr. Nishan. You will receive a reminder letter in the mail two months in advance. If you don't receive a letter, please call our office to schedule the follow-up appointment.   If you need a refill on your cardiac medications before your next appointment, please call your pharmacy.    

## 2015-12-18 ENCOUNTER — Other Ambulatory Visit: Payer: Self-pay | Admitting: Internal Medicine

## 2015-12-19 ENCOUNTER — Other Ambulatory Visit: Payer: Self-pay | Admitting: *Deleted

## 2015-12-19 MED ORDER — HYDROCODONE-ACETAMINOPHEN 7.5-325 MG PO TABS: 1.0000 | ORAL_TABLET | Freq: Four times a day (QID) | ORAL | Status: DC | PRN

## 2015-12-19 NOTE — Telephone Encounter (Signed)
Last 2/21 uds 2/28 Next visit 5/30

## 2015-12-22 ENCOUNTER — Other Ambulatory Visit: Payer: Self-pay | Admitting: Internal Medicine

## 2015-12-27 ENCOUNTER — Encounter: Payer: Self-pay | Admitting: Infectious Diseases

## 2015-12-29 ENCOUNTER — Other Ambulatory Visit: Payer: Self-pay | Admitting: Internal Medicine

## 2015-12-29 ENCOUNTER — Other Ambulatory Visit: Payer: Self-pay | Admitting: Cardiovascular Disease

## 2016-01-05 ENCOUNTER — Other Ambulatory Visit: Payer: Self-pay | Admitting: Cardiovascular Disease

## 2016-01-07 ENCOUNTER — Other Ambulatory Visit: Payer: Self-pay | Admitting: Internal Medicine

## 2016-01-08 ENCOUNTER — Other Ambulatory Visit: Payer: Self-pay | Admitting: *Deleted

## 2016-01-08 NOTE — Telephone Encounter (Signed)
Please see my note from earlier today.  Dr. Dareen Piano filled these prescriptions in late March.  If they are appropriate he can refill them upon his return.

## 2016-01-08 NOTE — Telephone Encounter (Signed)
Looks like the pravastatin was just done for #90 on 4/4

## 2016-01-08 NOTE — Telephone Encounter (Signed)
Folic acid and Duloxetine also just refilled in late March, admittedly no refills were provided, but Dr. Dareen Piano may have had a reason for this.  Dr. Dareen Piano will return before the most recent prescriptions run out and he can refill them when due if they are indicated upon his return.

## 2016-01-16 ENCOUNTER — Other Ambulatory Visit: Payer: Self-pay | Admitting: Internal Medicine

## 2016-01-16 MED ORDER — HYDROCODONE-ACETAMINOPHEN 7.5-325 MG PO TABS: 1.0000 | ORAL_TABLET | Freq: Four times a day (QID) | ORAL | Status: DC | PRN

## 2016-01-16 NOTE — Telephone Encounter (Signed)
Last written 3/23 Next appt 5/30 Last uds 2/28

## 2016-01-27 NOTE — Addendum Note (Signed)
Addended by: Orson Gear on: 01/27/2016 09:40 AM   Modules accepted: Orders

## 2016-01-28 DIAGNOSIS — M069 Rheumatoid arthritis, unspecified: Secondary | ICD-10-CM | POA: Diagnosis not present

## 2016-01-28 DIAGNOSIS — E1065 Type 1 diabetes mellitus with hyperglycemia: Secondary | ICD-10-CM | POA: Diagnosis not present

## 2016-01-28 DIAGNOSIS — J449 Chronic obstructive pulmonary disease, unspecified: Secondary | ICD-10-CM | POA: Diagnosis not present

## 2016-01-28 DIAGNOSIS — Z21 Asymptomatic human immunodeficiency virus [HIV] infection status: Secondary | ICD-10-CM | POA: Diagnosis not present

## 2016-01-28 DIAGNOSIS — Z720 Tobacco use: Secondary | ICD-10-CM | POA: Diagnosis not present

## 2016-01-28 DIAGNOSIS — M4120 Other idiopathic scoliosis, site unspecified: Secondary | ICD-10-CM | POA: Diagnosis not present

## 2016-01-28 DIAGNOSIS — I25110 Atherosclerotic heart disease of native coronary artery with unstable angina pectoris: Secondary | ICD-10-CM | POA: Diagnosis not present

## 2016-02-02 ENCOUNTER — Other Ambulatory Visit: Payer: Self-pay | Admitting: Internal Medicine

## 2016-02-06 ENCOUNTER — Other Ambulatory Visit: Payer: Self-pay | Admitting: Internal Medicine

## 2016-02-16 ENCOUNTER — Other Ambulatory Visit: Payer: Self-pay | Admitting: Internal Medicine

## 2016-02-17 ENCOUNTER — Other Ambulatory Visit: Payer: Self-pay

## 2016-02-17 MED ORDER — HYDROCODONE-ACETAMINOPHEN 7.5-325 MG PO TABS: 1.0000 | ORAL_TABLET | Freq: Four times a day (QID) | ORAL | Status: DC | PRN

## 2016-02-17 NOTE — Telephone Encounter (Signed)
Last refill 4/20 OV & UDS 2/28 Future PCP 5/30

## 2016-02-18 ENCOUNTER — Other Ambulatory Visit: Payer: Self-pay

## 2016-02-18 DIAGNOSIS — F411 Generalized anxiety disorder: Secondary | ICD-10-CM

## 2016-02-18 MED ORDER — ALPRAZOLAM 0.5 MG PO TABS: 0.5000 mg | ORAL_TABLET | Freq: Every evening | ORAL | Status: DC | PRN

## 2016-02-18 NOTE — Telephone Encounter (Signed)
Review of most recent urine drug screen was negative for alprazolam.  He takes the medication PRN so this is not necessarily a red flag.  Review of the [place] Controlled substance database reveals that the last refill was on December 05, 2015 (2 1/2 months ago, prior to that it was in January.  Each refill was for #30 tablets.  Thus, it appears he is taking no more than #15/month PRN.  Review of Dr. Wilber Bihari last note in February notes a plan to wean the alprazolam.  I refilled the alprazolam for a 1 month supply in Dr. Wilber Bihari absence by providing #15 tablets.  Further refills will be per Dr. Dareen Piano based upon his response to Dr. Wilber Bihari therapy and plans for weaning.

## 2016-02-18 NOTE — Telephone Encounter (Signed)
Called to pharmacy- physical script destroyed per HIPPA guidelines

## 2016-02-21 ENCOUNTER — Telehealth: Payer: Self-pay | Admitting: Internal Medicine

## 2016-02-21 NOTE — Telephone Encounter (Signed)
APT. REMINDER CALL, LMTCB °

## 2016-02-25 ENCOUNTER — Ambulatory Visit (INDEPENDENT_AMBULATORY_CARE_PROVIDER_SITE_OTHER): Payer: Commercial Managed Care - HMO | Admitting: Internal Medicine

## 2016-02-25 ENCOUNTER — Encounter: Payer: Self-pay | Admitting: Internal Medicine

## 2016-02-25 VITALS — BP 143/71 | HR 60 | Temp 97.6°F | Ht 70.0 in | Wt 198.2 lb

## 2016-02-25 DIAGNOSIS — F411 Generalized anxiety disorder: Secondary | ICD-10-CM

## 2016-02-25 DIAGNOSIS — E7800 Pure hypercholesterolemia, unspecified: Secondary | ICD-10-CM | POA: Diagnosis not present

## 2016-02-25 DIAGNOSIS — E11649 Type 2 diabetes mellitus with hypoglycemia without coma: Secondary | ICD-10-CM

## 2016-02-25 DIAGNOSIS — E1169 Type 2 diabetes mellitus with other specified complication: Secondary | ICD-10-CM

## 2016-02-25 DIAGNOSIS — Z23 Encounter for immunization: Secondary | ICD-10-CM

## 2016-02-25 DIAGNOSIS — Z0000 Encounter for general adult medical examination without abnormal findings: Secondary | ICD-10-CM

## 2016-02-25 DIAGNOSIS — Z794 Long term (current) use of insulin: Secondary | ICD-10-CM

## 2016-02-25 DIAGNOSIS — F329 Major depressive disorder, single episode, unspecified: Secondary | ICD-10-CM

## 2016-02-25 DIAGNOSIS — I10 Essential (primary) hypertension: Secondary | ICD-10-CM | POA: Diagnosis not present

## 2016-02-25 DIAGNOSIS — F32A Depression, unspecified: Secondary | ICD-10-CM

## 2016-02-25 LAB — POCT GLYCOSYLATED HEMOGLOBIN (HGB A1C): HEMOGLOBIN A1C: 7.9

## 2016-02-25 LAB — GLUCOSE, CAPILLARY: Glucose-Capillary: 200 mg/dL — ABNORMAL HIGH (ref 65–99)

## 2016-02-25 MED ORDER — ALPRAZOLAM 0.5 MG PO TABS: 0.5000 mg | ORAL_TABLET | Freq: Every evening | ORAL | Status: DC | PRN

## 2016-02-25 MED ORDER — LISINOPRIL 10 MG PO TABS: 10.0000 mg | ORAL_TABLET | Freq: Every day | ORAL | Status: DC

## 2016-02-25 MED ORDER — HYDROCODONE-ACETAMINOPHEN 7.5-325 MG PO TABS: 1.0000 | ORAL_TABLET | Freq: Four times a day (QID) | ORAL | Status: DC | PRN

## 2016-02-25 MED ORDER — INSULIN REGULAR HUMAN 100 UNIT/ML IJ SOLN: INTRAMUSCULAR | Status: DC

## 2016-02-25 MED ORDER — FLUTICASONE PROPIONATE 50 MCG/ACT NA SUSP: 1.0000 | Freq: Every day | NASAL | Status: DC

## 2016-02-25 MED ORDER — LIRAGLUTIDE 18 MG/3ML ~~LOC~~ SOPN: 0.6000 mg | PEN_INJECTOR | Freq: Every day | SUBCUTANEOUS | Status: DC

## 2016-02-25 MED ORDER — LISINOPRIL 10 MG PO TABS
10.0000 mg | ORAL_TABLET | Freq: Every day | ORAL | Status: DC
Start: 2016-02-25 — End: 2016-12-01

## 2016-02-25 MED ORDER — DULOXETINE HCL 60 MG PO CPEP: 60.0000 mg | ORAL_CAPSULE | Freq: Every day | ORAL | Status: DC

## 2016-02-25 MED ORDER — INSULIN PEN NEEDLE 31G X 5 MM MISC: Status: DC

## 2016-02-25 NOTE — Assessment & Plan Note (Signed)
-   Will check lipid panel today - c/w pravastatin - refilled today

## 2016-02-25 NOTE — Assessment & Plan Note (Signed)
-   Will give PCV 13 vaccine today

## 2016-02-25 NOTE — Progress Notes (Signed)
   Subjective:    Patient ID: EYOAB LADUCA, male    DOB: 06/05/1956, 60 y.o.   MRN: [id number]  HPI Patient seen and examined. He is here for routine follow up of his DM and HTN. Of note, he states that he has had 2-3 episodes of hypoglycemia including 1 episode of syncope from taking insulin and not eating all day. He complained of diaphoresis and lightheadedness and tingling and numbness of hands.He remembered hitting the ground and woke up after a few minutes.  Patient states his depression is slightly better on his bupropion but he still has days where it is worse.  He is compliant with his pain medication and takes it only as needed for his joint pain. States it improves his functional ability and helps him do his daily work.   Review of Systems  Constitutional: Negative.   HENT: Negative.   Eyes: Negative.   Respiratory: Negative.   Cardiovascular: Negative.   Gastrointestinal: Negative.   Musculoskeletal: Positive for arthralgias. Negative for myalgias and joint swelling.  Neurological: Positive for syncope.  Psychiatric/Behavioral: Negative.        Objective:   Physical Exam  Constitutional: He is oriented to person, place, and time. He appears well-developed and well-nourished.  HENT:  Head: Normocephalic and atraumatic.  Eyes: Conjunctivae are normal.  Neck: Normal range of motion. Neck supple.  Cardiovascular: Normal rate, regular rhythm and normal heart sounds.   No murmur heard. Pulmonary/Chest: Effort normal and breath sounds normal. No respiratory distress. He has no wheezes.  Abdominal: Soft. Bowel sounds are normal. He exhibits no distension. There is no tenderness.  Musculoskeletal: Normal range of motion. He exhibits no edema.  Lymphadenopathy:    He has no cervical adenopathy.  Neurological: He is alert and oriented to person, place, and time.  Skin: Skin is warm. No rash noted. No erythema.  Psychiatric: He has a normal mood and affect. His behavior is  normal.          Assessment & Plan:  Please see problem based charting for assessment and plan:

## 2016-02-25 NOTE — Patient Instructions (Signed)
-   It was a pleasure seeing you today - I have decreased your insulin to 15 units in the morning and evening - I have started you on victoza for your diabetes - I have increased your dose of duloxetine to 60 mg - I have given you 2 additional refills for your pain meds and anxiety meds - Please follow up in 1 month

## 2016-02-25 NOTE — Assessment & Plan Note (Addendum)
Lab Results  Component Value Date   HGBA1C 7.9 02/25/2016   HGBA1C 8.9 11/26/2015   HGBA1C 9.6 08/12/2015     Assessment: Diabetes control:  fair Progress toward A1C goal:   Improved Comments: Patient with episodes of hypoglycemia from taking insulin and not eating.  Plan: Medications:  Will decrease Novolin R to 15 units BID and start liraglutide and continue metformin Home glucose monitoring: Frequency:   Timing:   Instruction/counseling given: reminded to bring blood glucose meter & log to each visit, discussed the need for weight loss and discussed diet Educational resources provided: brochure (denies need ) Self management tools provided: copy of home glucose meter download Other plans: Explained to patients the importance of eating immediately after taking insulin and to call 911 if he has recurrent hypoglycemic episodes/syncope. Will follow up in 1 month. Patient to call us if he has recurrent issues as well. Will check urine microalbumin today

## 2016-02-25 NOTE — Assessment & Plan Note (Signed)
BP Readings from Last 3 Encounters:  02/25/16 143/71  12/16/15 130/70  11/26/15 124/64    Lab Results  Component Value Date   NA 136 09/03/2015   K 4.5 09/03/2015   CREATININE 0.85 09/03/2015    Assessment: Blood pressure control:  fair Progress toward BP goal:   deteriorated Comments: Patient is compliant with meds. States he takes his BP at work and it is usually good  Plan: Medications:  continue current medications Educational resources provided: brochure (denies need ) Self management tools provided:   Other plans: Patient to maintain BP log and bring to next appointment. Refilled lisinopril. C/w HCTZ, terazosin

## 2016-02-25 NOTE — Assessment & Plan Note (Signed)
-   His mood has been variable. States he does " not cry" but still feels depressed - Patient started on duloxetine today - Will follow up in 1 month to assess for response

## 2016-02-25 NOTE — Assessment & Plan Note (Signed)
-   Patient given 2 additional refills of xanax to have a 3 month supply - He will follow with me every 3 months for reassessment and refills - he states he has been using his xanax usually once at night and has been under increased stress recently - I have started him on duloxetine and will hopefully will be able to gradually wean him off xanax

## 2016-02-26 LAB — MICROALBUMIN / CREATININE URINE RATIO
CREATININE, UR: 25.1 mg/dL
MICROALB/CREAT RATIO: 64.9 mg/g{creat} — AB (ref 0.0–30.0)
MICROALBUM., U, RANDOM: 16.3 ug/mL

## 2016-02-27 ENCOUNTER — Telehealth: Payer: Self-pay | Admitting: Internal Medicine

## 2016-02-27 NOTE — Telephone Encounter (Signed)
Results of urine microalbumin discussed with patient. Will re evaluate on follow up and consider increasing lisinopril depending on BP.

## 2016-03-02 ENCOUNTER — Other Ambulatory Visit: Payer: Commercial Managed Care - HMO

## 2016-03-02 ENCOUNTER — Other Ambulatory Visit (HOSPITAL_COMMUNITY)
Admission: RE | Admit: 2016-03-02 | Discharge: 2016-03-02 | Disposition: A | Discharge status: Home / Self Care | Payer: Commercial Managed Care - HMO | Source: Ambulatory Visit | Attending: Infectious Diseases | Admitting: Infectious Diseases

## 2016-03-02 DIAGNOSIS — E7800 Pure hypercholesterolemia, unspecified: Secondary | ICD-10-CM

## 2016-03-02 DIAGNOSIS — Z113 Encounter for screening for infections with a predominantly sexual mode of transmission: Secondary | ICD-10-CM | POA: Diagnosis not present

## 2016-03-02 DIAGNOSIS — B20 Human immunodeficiency virus [HIV] disease: Secondary | ICD-10-CM

## 2016-03-02 LAB — CBC
HEMATOCRIT: 43.6 % (ref 38.5–50.0)
HEMOGLOBIN: 15 g/dL (ref 13.2–17.1)
MCH: 32.3 pg (ref 27.0–33.0)
MCHC: 34.4 g/dL (ref 32.0–36.0)
MCV: 93.8 fL (ref 80.0–100.0)
MPV: 10.1 fL (ref 7.5–12.5)
Platelets: 176 10*3/uL (ref 140–400)
RBC: 4.65 MIL/uL (ref 4.20–5.80)
RDW: 14 % (ref 11.0–15.0)
WBC: 5.4 10*3/uL (ref 3.8–10.8)

## 2016-03-02 LAB — COMPREHENSIVE METABOLIC PANEL
ALBUMIN: 4.2 g/dL (ref 3.6–5.1)
ALT: 56 U/L — ABNORMAL HIGH (ref 9–46)
AST: 40 U/L — AB (ref 10–35)
Alkaline Phosphatase: 110 U/L (ref 40–115)
BUN: 20 mg/dL (ref 7–25)
CALCIUM: 9.6 mg/dL (ref 8.6–10.3)
CHLORIDE: 100 mmol/L (ref 98–110)
CO2: 23 mmol/L (ref 20–31)
Creat: 0.82 mg/dL (ref 0.70–1.25)
GLUCOSE: 267 mg/dL — AB (ref 65–99)
Potassium: 4.3 mmol/L (ref 3.5–5.3)
SODIUM: 136 mmol/L (ref 135–146)
Total Bilirubin: 0.4 mg/dL (ref 0.2–1.2)
Total Protein: 6.6 g/dL (ref 6.1–8.1)

## 2016-03-02 LAB — LIPID PANEL
CHOL/HDL RATIO: 5.7 ratio — AB (ref <=5.0)
Cholesterol: 212 mg/dL — ABNORMAL HIGH (ref 125–200)
HDL: 37 mg/dL — ABNORMAL LOW (ref >=40)
LDL CALC: 96 mg/dL (ref <130)
TRIGLYCERIDES: 394 mg/dL — AB (ref <150)
VLDL: 79 mg/dL — AB (ref <30)

## 2016-03-03 LAB — URINE CYTOLOGY ANCILLARY ONLY
CHLAMYDIA, DNA PROBE: NEGATIVE
NEISSERIA GONORRHEA: NEGATIVE

## 2016-03-03 LAB — RPR

## 2016-03-03 LAB — T-HELPER CELL (CD4) - (RCID CLINIC ONLY)
CD4 T CELL HELPER: 47 % (ref 33–55)
CD4 T Cell Abs: 840 /uL (ref 400–2700)

## 2016-03-03 LAB — HIV-1 RNA QUANT-NO REFLEX-BLD
HIV 1 RNA Quant: <20 copies/mL (ref <20)
HIV-1 RNA Quant, Log: <1.3 Log copies/mL (ref <1.30)

## 2016-03-08 ENCOUNTER — Other Ambulatory Visit: Payer: Self-pay | Admitting: Internal Medicine

## 2016-03-16 ENCOUNTER — Ambulatory Visit: Payer: Commercial Managed Care - HMO | Admitting: Infectious Diseases

## 2016-03-29 ENCOUNTER — Other Ambulatory Visit: Payer: Self-pay | Admitting: Internal Medicine

## 2016-03-30 ENCOUNTER — Other Ambulatory Visit: Payer: Self-pay | Admitting: Internal Medicine

## 2016-03-30 DIAGNOSIS — F411 Generalized anxiety disorder: Secondary | ICD-10-CM

## 2016-03-30 DIAGNOSIS — R69 Illness, unspecified: Secondary | ICD-10-CM | POA: Diagnosis not present

## 2016-03-30 MED ORDER — ALPRAZOLAM 0.5 MG PO TABS: 0.5000 mg | ORAL_TABLET | Freq: Every evening | ORAL | Status: DC | PRN

## 2016-04-01 NOTE — Telephone Encounter (Signed)
Called to pharmacy 

## 2016-04-04 ENCOUNTER — Other Ambulatory Visit: Payer: Self-pay | Admitting: Internal Medicine

## 2016-04-10 ENCOUNTER — Other Ambulatory Visit: Payer: Self-pay | Admitting: Infectious Diseases

## 2016-04-16 DIAGNOSIS — I25110 Atherosclerotic heart disease of native coronary artery with unstable angina pectoris: Secondary | ICD-10-CM | POA: Diagnosis not present

## 2016-04-16 DIAGNOSIS — Z720 Tobacco use: Secondary | ICD-10-CM | POA: Diagnosis not present

## 2016-04-16 DIAGNOSIS — E1065 Type 1 diabetes mellitus with hyperglycemia: Secondary | ICD-10-CM | POA: Diagnosis not present

## 2016-04-16 DIAGNOSIS — M069 Rheumatoid arthritis, unspecified: Secondary | ICD-10-CM | POA: Diagnosis not present

## 2016-04-16 DIAGNOSIS — J449 Chronic obstructive pulmonary disease, unspecified: Secondary | ICD-10-CM | POA: Diagnosis not present

## 2016-04-16 DIAGNOSIS — M4120 Other idiopathic scoliosis, site unspecified: Secondary | ICD-10-CM | POA: Diagnosis not present

## 2016-04-16 DIAGNOSIS — Z21 Asymptomatic human immunodeficiency virus [HIV] infection status: Secondary | ICD-10-CM | POA: Diagnosis not present

## 2016-05-01 NOTE — Addendum Note (Signed)
Addended by: Orson Gear on: 05/01/2016 11:22 AM   Modules accepted: Orders

## 2016-05-18 ENCOUNTER — Other Ambulatory Visit: Payer: Self-pay | Admitting: Internal Medicine

## 2016-05-18 DIAGNOSIS — F411 Generalized anxiety disorder: Secondary | ICD-10-CM

## 2016-05-19 ENCOUNTER — Other Ambulatory Visit: Payer: Self-pay | Admitting: *Deleted

## 2016-05-19 DIAGNOSIS — F411 Generalized anxiety disorder: Secondary | ICD-10-CM

## 2016-05-19 NOTE — Telephone Encounter (Signed)
error 

## 2016-05-19 NOTE — Telephone Encounter (Signed)
Hydrocodone last refilled 6/22. Xanax last refilled 7/3. UDS 11/26/15. Pain contract signed 12/05/15

## 2016-05-21 MED ORDER — HYDROCODONE-ACETAMINOPHEN 7.5-325 MG PO TABS: 1.0000 | ORAL_TABLET | Freq: Four times a day (QID) | ORAL | 0 refills | Status: DC | PRN

## 2016-05-21 MED ORDER — ALPRAZOLAM 0.5 MG PO TABS: 0.5000 mg | ORAL_TABLET | Freq: Every evening | ORAL | 0 refills | Status: DC | PRN

## 2016-05-30 ENCOUNTER — Other Ambulatory Visit: Payer: Self-pay | Admitting: Internal Medicine

## 2016-06-04 ENCOUNTER — Other Ambulatory Visit: Payer: Self-pay | Admitting: Infectious Diseases

## 2016-06-06 ENCOUNTER — Other Ambulatory Visit: Payer: Self-pay | Admitting: Internal Medicine

## 2016-06-06 ENCOUNTER — Other Ambulatory Visit: Payer: Self-pay | Admitting: Cardiovascular Disease

## 2016-06-12 ENCOUNTER — Other Ambulatory Visit: Payer: Self-pay | Admitting: Internal Medicine

## 2016-06-15 ENCOUNTER — Encounter: Payer: Self-pay | Admitting: Infectious Diseases

## 2016-06-20 DIAGNOSIS — J302 Other seasonal allergic rhinitis: Secondary | ICD-10-CM | POA: Diagnosis not present

## 2016-06-20 DIAGNOSIS — J209 Acute bronchitis, unspecified: Secondary | ICD-10-CM | POA: Diagnosis not present

## 2016-06-20 DIAGNOSIS — J441 Chronic obstructive pulmonary disease with (acute) exacerbation: Secondary | ICD-10-CM | POA: Diagnosis not present

## 2016-06-20 DIAGNOSIS — J0140 Acute pansinusitis, unspecified: Secondary | ICD-10-CM | POA: Diagnosis not present

## 2016-06-23 ENCOUNTER — Other Ambulatory Visit: Payer: Self-pay | Admitting: *Deleted

## 2016-06-23 ENCOUNTER — Other Ambulatory Visit: Payer: Self-pay | Admitting: Internal Medicine

## 2016-06-23 DIAGNOSIS — F411 Generalized anxiety disorder: Secondary | ICD-10-CM

## 2016-06-24 MED ORDER — ALPRAZOLAM 0.5 MG PO TABS: 0.5000 mg | ORAL_TABLET | Freq: Every evening | ORAL | 0 refills | Status: DC | PRN

## 2016-06-24 MED ORDER — HYDROCODONE-ACETAMINOPHEN 7.5-325 MG PO TABS: 1.0000 | ORAL_TABLET | Freq: Four times a day (QID) | ORAL | 0 refills | Status: DC | PRN

## 2016-06-24 MED ORDER — ALPRAZOLAM 0.5 MG PO TABS: 0.5000 mg | ORAL_TABLET | Freq: Every evening | ORAL | 5 refills | Status: DC | PRN

## 2016-06-25 ENCOUNTER — Encounter: Payer: Self-pay | Admitting: *Deleted

## 2016-06-25 NOTE — Telephone Encounter (Signed)
Called 30 days of alprazolam to [facility] no refills

## 2016-06-30 ENCOUNTER — Ambulatory Visit (INDEPENDENT_AMBULATORY_CARE_PROVIDER_SITE_OTHER): Payer: Commercial Managed Care - HMO | Admitting: Internal Medicine

## 2016-06-30 ENCOUNTER — Encounter: Payer: Self-pay | Admitting: Internal Medicine

## 2016-06-30 VITALS — BP 138/68 | HR 72 | Temp 97.5°F | Ht 70.0 in | Wt 198.7 lb

## 2016-06-30 DIAGNOSIS — F411 Generalized anxiety disorder: Secondary | ICD-10-CM

## 2016-06-30 DIAGNOSIS — J449 Chronic obstructive pulmonary disease, unspecified: Secondary | ICD-10-CM

## 2016-06-30 DIAGNOSIS — I10 Essential (primary) hypertension: Secondary | ICD-10-CM | POA: Diagnosis not present

## 2016-06-30 DIAGNOSIS — E118 Type 2 diabetes mellitus with unspecified complications: Secondary | ICD-10-CM | POA: Diagnosis not present

## 2016-06-30 DIAGNOSIS — R05 Cough: Secondary | ICD-10-CM

## 2016-06-30 DIAGNOSIS — M069 Rheumatoid arthritis, unspecified: Secondary | ICD-10-CM | POA: Diagnosis not present

## 2016-06-30 DIAGNOSIS — F329 Major depressive disorder, single episode, unspecified: Secondary | ICD-10-CM

## 2016-06-30 DIAGNOSIS — J019 Acute sinusitis, unspecified: Secondary | ICD-10-CM | POA: Insufficient documentation

## 2016-06-30 DIAGNOSIS — F32A Depression, unspecified: Secondary | ICD-10-CM

## 2016-06-30 DIAGNOSIS — Z79899 Other long term (current) drug therapy: Secondary | ICD-10-CM

## 2016-06-30 DIAGNOSIS — F17210 Nicotine dependence, cigarettes, uncomplicated: Secondary | ICD-10-CM

## 2016-06-30 DIAGNOSIS — J42 Unspecified chronic bronchitis: Secondary | ICD-10-CM

## 2016-06-30 DIAGNOSIS — Z794 Long term (current) use of insulin: Secondary | ICD-10-CM

## 2016-06-30 DIAGNOSIS — Z0000 Encounter for general adult medical examination without abnormal findings: Secondary | ICD-10-CM

## 2016-06-30 DIAGNOSIS — F418 Other specified anxiety disorders: Secondary | ICD-10-CM

## 2016-06-30 DIAGNOSIS — R0981 Nasal congestion: Secondary | ICD-10-CM

## 2016-06-30 LAB — POCT GLYCOSYLATED HEMOGLOBIN (HGB A1C): Hemoglobin A1C: 9.6

## 2016-06-30 LAB — GLUCOSE, CAPILLARY: Glucose-Capillary: 345 mg/dL — ABNORMAL HIGH (ref 65–99)

## 2016-06-30 MED ORDER — LIRAGLUTIDE 18 MG/3ML ~~LOC~~ SOPN: 1.2000 mg | PEN_INJECTOR | Freq: Every day | SUBCUTANEOUS | 3 refills | Status: DC

## 2016-06-30 NOTE — Assessment & Plan Note (Signed)
-   Patient has been on xanax chronically - Was started on duloxetine and reports mild improvement but has been taking xanax daily - Will check UDS - Also discussed with patient about the possibility of following up with psych given his persistent symptoms. He is agreeable - Will refer to Dr. Casimiro Needle

## 2016-06-30 NOTE — Assessment & Plan Note (Signed)
-   [facility] - Completed course of doxycycline - Still with persistent nasal congestion and cough - Likely viral in nature - Encouraged saline nasal spray use, hot showers with steam inhalation, guaifenesin and claritin

## 2016-06-30 NOTE — Progress Notes (Signed)
   Subjective:    Patient ID: Riley Jones, male    DOB: [birth year], 60 y.o.   MRN: [id number]  HPI  Patient seen and examined. Patient is here for routine follow up of his DM and HTN. He does note that he has had nasal and sinus congestion and was evaluated at Urgent care and was treated for possible sinusitis as well as a possible COPD examination. He still complains of some congestion but wheezing has resolved. No fevers/myalgias.     Review of Systems  Constitutional: Negative.   HENT: Positive for congestion, postnasal drip, rhinorrhea and sinus pressure. Negative for ear discharge and ear pain.   Eyes: Negative.   Respiratory: Negative.   Cardiovascular: Negative.   Gastrointestinal: Negative.   Musculoskeletal: Positive for arthralgias. Negative for gait problem and myalgias.  Skin: Negative.   Neurological: Negative.   Psychiatric/Behavioral: Negative.        Objective:   Physical Exam  Constitutional: He is oriented to person, place, and time. He appears well-developed and well-nourished.  HENT:  Head: Normocephalic and atraumatic.  Right Ear: External ear normal.  Left Ear: External ear normal.  Mouth/Throat: No oropharyngeal exudate.  Mild postpharyngeal erythema + Nasal turbinated edematous and red+ NO sinus tenderness  Neck: Normal range of motion. Neck supple.  Cardiovascular: Normal rate, regular rhythm and normal heart sounds.   Pulmonary/Chest: Effort normal and breath sounds normal. No respiratory distress. He has no wheezes.  Abdominal: Soft. Bowel sounds are normal. He exhibits no distension. There is no tenderness.  Musculoskeletal: Normal range of motion. He exhibits no edema.  Lymphadenopathy:    He has no cervical adenopathy.  Neurological: He is alert and oriented to person, place, and time.  Skin: Skin is warm. No erythema.  Psychiatric: He has a normal mood and affect. His behavior is normal.          Assessment & Plan:   Please see  problem based charting for assessment and plan:

## 2016-06-30 NOTE — Assessment & Plan Note (Signed)
-   Patient following with rheum as an outpatient - C/w leflunomide per rheum - Patient receiving narco for pain control. Discussed with patient the possibility of weaning him off narco  - Will check UDS - Will discuss with patient on follow up visit as to plan to wean him off narco

## 2016-06-30 NOTE — Assessment & Plan Note (Signed)
-   patient received his flu vaccine at [facility]

## 2016-06-30 NOTE — Assessment & Plan Note (Addendum)
-   Patient with worsening A1C - 9.6 - He was started on liraglutide 0.6 mg but was unsure how to find out how much he was injecting himself. He was injecting only 0.2 or 0.3 mg.  - He now understands how to inject the appropriate dosage - Will increase dosage to 1.2 mg - c/w metformin and insulin at current doses - Will f/u in 1 month

## 2016-06-30 NOTE — Assessment & Plan Note (Signed)
-   Stated his symptoms have mildly improved since starting duloxetine - Will refer to psych for follow up given persistent symptoms

## 2016-06-30 NOTE — Patient Instructions (Addendum)
- It was a pleasure seeing you today - You can stop taking the monteleukast - We have changed your victoza to 1.2 mg from 0.6 - Please follow up in 1 month - We would like to refer you to a therapist.  - We will check your urine tests today   Upper Respiratory Infection, Adult Most upper respiratory infections (URIs) are a viral infection of the air passages leading to the lungs. A URI affects the nose, throat, and upper air passages. The most common type of URI is nasopharyngitis and is typically referred to as "the common cold." URIs run their course and usually go away on their own. Most of the time, a URI does not require medical attention, but sometimes a bacterial infection in the upper airways can follow a viral infection. This is called a secondary infection. Sinus and middle ear infections are common types of secondary upper respiratory infections. Bacterial pneumonia can also complicate a URI. A URI can worsen asthma and chronic obstructive pulmonary disease (COPD). Sometimes, these complications can require emergency medical care and may be life threatening.  CAUSES Almost all URIs are caused by viruses. A virus is a type of germ and can spread from one person to another.  RISKS FACTORS You may be at risk for a URI if:   You smoke.   You have chronic heart or lung disease.  You have a weakened defense (immune) system.   You are very young or very old.   You have nasal allergies or asthma.  You work in crowded or poorly ventilated areas.  You work in health care facilities or schools. SIGNS AND SYMPTOMS  Symptoms typically develop 2-3 days after you come in contact with a cold virus. Most viral URIs last 7-10 days. However, viral URIs from the influenza virus (flu virus) can last 14-18 days and are typically more severe. Symptoms may include:   Runny or stuffy (congested) nose.   Sneezing.   Cough.   Sore throat.   Headache.   Fatigue.   Fever.    Loss of appetite.   Pain in your forehead, behind your eyes, and over your cheekbones (sinus pain).  Muscle aches.  DIAGNOSIS  Your health care provider may diagnose a URI by:  Physical exam.  Tests to check that your symptoms are not due to another condition such as:  Strep throat.  Sinusitis.  Pneumonia.  Asthma. TREATMENT  A URI goes away on its own with time. It cannot be cured with medicines, but medicines may be prescribed or recommended to relieve symptoms. Medicines may help:  Reduce your fever.  Reduce your cough.  Relieve nasal congestion. HOME CARE INSTRUCTIONS   Take medicines only as directed by your health care provider.   Gargle warm saltwater or take cough drops to comfort your throat as directed by your health care provider.  Use a warm mist humidifier or inhale steam from a shower to increase air moisture. This may make it easier to breathe.  Drink enough fluid to keep your urine clear or pale yellow.   Eat soups and other clear broths and maintain good nutrition.   Rest as needed.   Return to work when your temperature has returned to normal or as your health care provider advises. You may need to stay home longer to avoid infecting others. You can also use a face mask and careful hand washing to prevent spread of the virus.  Increase the usage of your inhaler if you have  asthma.   Do not use any tobacco products, including cigarettes, chewing tobacco, or electronic cigarettes. If you need help quitting, ask your health care provider. PREVENTION  The best way to protect yourself from getting a cold is to practice good hygiene.   Avoid oral or hand contact with people with cold symptoms.   Wash your hands often if contact occurs.  There is no clear evidence that vitamin C, vitamin E, echinacea, or exercise reduces the chance of developing a cold. However, it is always recommended to get plenty of rest, exercise, and practice good  nutrition.  SEEK MEDICAL CARE IF:   You are getting worse rather than better.   Your symptoms are not controlled by medicine.   You have chills.  You have worsening shortness of breath.  You have brown or red mucus.  You have yellow or brown nasal discharge.  You have pain in your face, especially when you bend forward.  You have a fever.  You have swollen neck glands.  You have pain while swallowing.  You have white areas in the back of your throat. SEEK IMMEDIATE MEDICAL CARE IF:   You have severe or persistent:  Headache.  Ear pain.  Sinus pain.  Chest pain.  You have chronic lung disease and any of the following:  Wheezing.  Prolonged cough.  Coughing up blood.  A change in your usual mucus.  You have a stiff neck.  You have changes in your:  Vision.  Hearing.  Thinking.  Mood. MAKE SURE YOU:   Understand these instructions.  Will watch your condition.  Will get help right away if you are not doing well or get worse.   This information is not intended to replace advice given to you by your health care provider. Make sure you discuss any questions you have with your health care provider.   Document Released: 03/10/2001 Document Revised: 01/29/2015 Document Reviewed: 12/20/2013 Elsevier Interactive Patient Education [employer].

## 2016-06-30 NOTE — Assessment & Plan Note (Signed)
-   Patient with mild COPD exacerbation recently after sinusitis infection - Had worsening [facility] - Was treated with doxycycline and prednisone - COPD exacerbation now resolved - Patient still attempting to quit smoking and states he has been improving

## 2016-06-30 NOTE — Assessment & Plan Note (Addendum)
BP Readings from Last 3 Encounters:  06/30/16 138/68  02/25/16 (!) 143/71  12/16/15 130/70    Lab Results  Component Value Date   NA 136 03/02/2016   K 4.3 03/02/2016   CREATININE 0.82 03/02/2016    Assessment: Blood pressure control:  fair Progress toward BP goal:   improved Comments: compliant with HCTZ, terazosin, lisinopril  Plan: Medications:  continue current medications Educational resources provided:   Self management tools provided:   Other plans:

## 2016-07-04 ENCOUNTER — Other Ambulatory Visit: Payer: Self-pay | Admitting: Internal Medicine

## 2016-07-06 ENCOUNTER — Encounter: Payer: Self-pay | Admitting: Licensed Clinical Social Worker

## 2016-07-07 ENCOUNTER — Other Ambulatory Visit: Payer: Self-pay | Admitting: Infectious Diseases

## 2016-07-07 LAB — MICROALBUMIN / CREATININE URINE RATIO
Creatinine, Urine: 34.5 mg/dL
MICROALBUM., U, RANDOM: 28 ug/mL
Microalb/Creat Ratio: 81.2 mg/g creat — ABNORMAL HIGH (ref 0.0–30.0)

## 2016-07-07 LAB — TOXASSURE SELECT,+ANTIDEPR,UR

## 2016-07-09 ENCOUNTER — Telehealth: Payer: Self-pay | Admitting: Internal Medicine

## 2016-07-09 NOTE — Telephone Encounter (Signed)
Tried to call patient to address abnormal UDS. Left voicemail for patient to call back.

## 2016-07-10 ENCOUNTER — Telehealth: Payer: Self-pay | Admitting: Internal Medicine

## 2016-07-10 NOTE — Telephone Encounter (Signed)
Patient returning phone call.  PT states his cell phone was not working correctly yesterday.  Please call him back to day as he states he is leaving for [place] at [facility] in the morning.

## 2016-07-19 ENCOUNTER — Other Ambulatory Visit: Payer: Self-pay | Admitting: Internal Medicine

## 2016-07-24 DIAGNOSIS — E1065 Type 1 diabetes mellitus with hyperglycemia: Secondary | ICD-10-CM | POA: Diagnosis not present

## 2016-07-24 DIAGNOSIS — J449 Chronic obstructive pulmonary disease, unspecified: Secondary | ICD-10-CM | POA: Diagnosis not present

## 2016-07-24 DIAGNOSIS — Z720 Tobacco use: Secondary | ICD-10-CM | POA: Diagnosis not present

## 2016-07-24 DIAGNOSIS — I25110 Atherosclerotic heart disease of native coronary artery with unstable angina pectoris: Secondary | ICD-10-CM | POA: Diagnosis not present

## 2016-07-24 DIAGNOSIS — Z21 Asymptomatic human immunodeficiency virus [HIV] infection status: Secondary | ICD-10-CM | POA: Diagnosis not present

## 2016-07-24 DIAGNOSIS — M4120 Other idiopathic scoliosis, site unspecified: Secondary | ICD-10-CM | POA: Diagnosis not present

## 2016-07-24 DIAGNOSIS — M069 Rheumatoid arthritis, unspecified: Secondary | ICD-10-CM | POA: Diagnosis not present

## 2016-07-26 ENCOUNTER — Other Ambulatory Visit: Payer: Self-pay | Admitting: Internal Medicine

## 2016-07-26 DIAGNOSIS — F411 Generalized anxiety disorder: Secondary | ICD-10-CM

## 2016-07-27 ENCOUNTER — Other Ambulatory Visit: Payer: Self-pay | Admitting: *Deleted

## 2016-07-27 DIAGNOSIS — F411 Generalized anxiety disorder: Secondary | ICD-10-CM

## 2016-07-28 ENCOUNTER — Telehealth: Payer: Self-pay | Admitting: *Deleted

## 2016-07-28 NOTE — Telephone Encounter (Signed)
Called pt per dr Dareen Piano at both ph#, lm for rtc, will try again today

## 2016-07-30 MED ORDER — HYDROCODONE-ACETAMINOPHEN 7.5-325 MG PO TABS: 1.0000 | ORAL_TABLET | Freq: Four times a day (QID) | ORAL | 0 refills | Status: DC | PRN

## 2016-07-30 MED ORDER — ALPRAZOLAM 0.5 MG PO TABS: 0.5000 mg | ORAL_TABLET | Freq: Every evening | ORAL | 0 refills | Status: DC | PRN

## 2016-08-03 ENCOUNTER — Ambulatory Visit (INDEPENDENT_AMBULATORY_CARE_PROVIDER_SITE_OTHER): Payer: Commercial Managed Care - HMO | Admitting: Internal Medicine

## 2016-08-03 VITALS — BP 163/79 | HR 63 | Temp 97.5°F | Ht 70.0 in | Wt 198.1 lb

## 2016-08-03 DIAGNOSIS — Z79891 Long term (current) use of opiate analgesic: Secondary | ICD-10-CM | POA: Diagnosis not present

## 2016-08-03 DIAGNOSIS — E118 Type 2 diabetes mellitus with unspecified complications: Secondary | ICD-10-CM

## 2016-08-03 DIAGNOSIS — E119 Type 2 diabetes mellitus without complications: Secondary | ICD-10-CM

## 2016-08-03 DIAGNOSIS — M069 Rheumatoid arthritis, unspecified: Secondary | ICD-10-CM | POA: Diagnosis not present

## 2016-08-03 DIAGNOSIS — Z7984 Long term (current) use of oral hypoglycemic drugs: Secondary | ICD-10-CM

## 2016-08-03 NOTE — Assessment & Plan Note (Signed)
He was started on liraglutide previously but this was making him feel altered so he has stopped taking it. He did not bring his meter today but states that his sugar has been well controlled. He denies blood sugar <70 or any episodes of hypoglycemia.  HbA1c 10/3 9.6 LDL96 Urine Microalbumin/creatinine ratio 81.2 Last foot exam 01/2016   P: discontinue liraglutide Continue metformin 1000 mg BID and insulin  He has follow up scheduled for later this month and will bring his meter with him to that visit for assessment

## 2016-08-03 NOTE — Progress Notes (Signed)
Internal Medicine Clinic Attending  I saw and evaluated the patient.  I personally confirmed the key portions of the history and exam documented by Dr. Hetty Ely and I reviewed pertinent patient test results.  The assessment, diagnosis, and plan were formulated together and I agree with the documentation in the resident's note.  Patient only takes his pain meds and xanax as needed and has not needed to take it over the last week. Looking at the [place] he does not have any red flags. I advised him to only ask for refills when he starts running low and not to refill it every month. I do not believe he is abusing his meds. His pain meds also increase his quality of life and he is able to do tasks like yard work with the help of his pain meds. Will continue his meds for now and monitor his UDS frequently.

## 2016-08-03 NOTE — Patient Instructions (Addendum)
It was a pleasure to meet you today Riley Jones,   Stop taking victoza  Keep tracking your sugars with your meter and don't forget to bring it to your next visit   Don't forget your appointment November 28th at 8:45 am  Please don't hesitate to call us if you have any new concerns or complaints

## 2016-08-03 NOTE — Progress Notes (Signed)
   CC: medication refill, diabetes management   HPI: Mr.Royston WILGUS SWEEN is a 60 y.o. with past medical history as outlined below who presents to clinic for follow up of arthritic pain and diabetes. He has rheumatoid arthritis which was diagnosed over 10 years ago. His joint disease is managed by a rheumatologist, he is not sure which medication he is on for RA. He has flares of his RA at times. When he does increased yard work or work in his home, he has significant joint pain and takes Norco for this.  He says he is feeling well today and denies any new concerns or complaints.  Please see problem list for status of the pt's chronic medical problems.  Past Medical History:  Diagnosis Date  . Anxiety   . Arthritis    "pretty much all over"  . Asthma    "mild" (07/17/2014)  . Atypical angina ([place])    Myoview 2003, Normal EF 64%  . CAD (coronary artery disease)    a. 10/20 DES to 80% mid LAD  . COPD (chronic obstructive pulmonary disease) ([place])    "mild" (07/17/2014)  . Depression   . Foot pain 03/30/2011  . GERD (gastroesophageal reflux disease)   . H/O hiatal hernia   . Headache    "weekly" (06/2014)  . History of gout   . HIV (human immunodeficiency virus infection) ([place]) 1995   DX after shingles followed by Dr. Ola Spurr (ID)  . Hx of cardiovascular stress test    ETT-Myoview (3/16):  Ex 10:15, No Ischemia, EF 57%; Normal Study  . Hyperlipidemia   . Hypertension    Well Controlled off meds  . Kidney stones    "passed them all"  . Pneumonia 4-5 times  . Rheumatoid arthritis(714.0)    Followed by Dr. Charlestine Night, off MTX and prdnisone since 2007  . Sleep apnea    does not wear mask (07/17/2014)  . Type II diabetes mellitus ([place])     Review of Systems:   Review of Systems  Respiratory: Negative for shortness of breath.   Cardiovascular: Negative for chest pain.  Musculoskeletal: Positive for joint pain.  Psychiatric/Behavioral: The patient has insomnia.     Physical  Exam:  Vitals:   08/03/16 0832  BP: (!) 163/79  Pulse: 63  Temp: 97.5 F (36.4 C)  TempSrc: Oral  SpO2: 99%  Weight: 198 lb 1.6 oz (89.9 kg)  Height: 5\' 10"  (1.778 m)   Physical Exam  Cardiovascular: Normal rate and regular rhythm.   No murmur heard. Pulmonary/Chest: He has no wheezes. He has no rales.  Musculoskeletal: He exhibits no edema or tenderness.  Decreased ROM left shoulder    Assessment & Plan:   See Encounters Tab for problem based charting.   Patient seen with Dr. Dareen Piano

## 2016-08-03 NOTE — Assessment & Plan Note (Addendum)
He has rheumatoid arthritis which was diagnosed over 10 years ago. His joint disease is managed by a rheumatologist, he is not sure which medication he is on for RA. He has flares of his RA at times and takes cymbalta in addition to his antirheumatic drugs for chronic pain management. When he does increased yard work or work in his home, he has significant joint pain and takes Norco for this. UDS was negative last visit, however he states that he takes Norco every few days and had not taken any prior to the last UDS. Checked the [place] controlled substance database - he is filling the prescription for norco #60 once per month.   -reinforced that he should only fill his prescription for norco when he is running out and to keep pill bottles in a safe place  - given a refill for norco 7.5 mg q6h PRN  -although he has not taken norco or xanax for the past week will recheck UDS today

## 2016-08-07 ENCOUNTER — Other Ambulatory Visit: Payer: Self-pay | Admitting: Infectious Diseases

## 2016-08-11 LAB — TOXASSURE SELECT,+ANTIDEPR,UR

## 2016-08-25 ENCOUNTER — Ambulatory Visit (INDEPENDENT_AMBULATORY_CARE_PROVIDER_SITE_OTHER): Payer: Commercial Managed Care - HMO | Admitting: Internal Medicine

## 2016-08-25 ENCOUNTER — Encounter: Payer: Self-pay | Admitting: Internal Medicine

## 2016-08-25 VITALS — BP 151/63 | HR 68 | Temp 97.8°F | Ht 70.0 in | Wt 201.0 lb

## 2016-08-25 DIAGNOSIS — Z79891 Long term (current) use of opiate analgesic: Secondary | ICD-10-CM

## 2016-08-25 DIAGNOSIS — Z79899 Other long term (current) drug therapy: Secondary | ICD-10-CM

## 2016-08-25 DIAGNOSIS — J310 Chronic rhinitis: Secondary | ICD-10-CM | POA: Diagnosis not present

## 2016-08-25 DIAGNOSIS — Z794 Long term (current) use of insulin: Secondary | ICD-10-CM

## 2016-08-25 DIAGNOSIS — L299 Pruritus, unspecified: Secondary | ICD-10-CM

## 2016-08-25 DIAGNOSIS — M069 Rheumatoid arthritis, unspecified: Secondary | ICD-10-CM

## 2016-08-25 DIAGNOSIS — J309 Allergic rhinitis, unspecified: Secondary | ICD-10-CM | POA: Insufficient documentation

## 2016-08-25 DIAGNOSIS — I10 Essential (primary) hypertension: Secondary | ICD-10-CM

## 2016-08-25 DIAGNOSIS — E1165 Type 2 diabetes mellitus with hyperglycemia: Secondary | ICD-10-CM | POA: Diagnosis not present

## 2016-08-25 DIAGNOSIS — F32A Depression, unspecified: Secondary | ICD-10-CM

## 2016-08-25 DIAGNOSIS — F329 Major depressive disorder, single episode, unspecified: Secondary | ICD-10-CM

## 2016-08-25 DIAGNOSIS — J3089 Other allergic rhinitis: Secondary | ICD-10-CM

## 2016-08-25 DIAGNOSIS — F17210 Nicotine dependence, cigarettes, uncomplicated: Secondary | ICD-10-CM

## 2016-08-25 DIAGNOSIS — F17200 Nicotine dependence, unspecified, uncomplicated: Secondary | ICD-10-CM

## 2016-08-25 DIAGNOSIS — E118 Type 2 diabetes mellitus with unspecified complications: Secondary | ICD-10-CM

## 2016-08-25 DIAGNOSIS — F411 Generalized anxiety disorder: Secondary | ICD-10-CM

## 2016-08-25 MED ORDER — MONTELUKAST SODIUM 10 MG PO TABS: 10.0000 mg | ORAL_TABLET | Freq: Every day | ORAL | 2 refills | Status: DC

## 2016-08-25 MED ORDER — ATORVASTATIN CALCIUM 40 MG PO TABS: 40.0000 mg | ORAL_TABLET | Freq: Every day | ORAL | 3 refills | Status: DC

## 2016-08-25 MED ORDER — TERAZOSIN HCL 2 MG PO CAPS: 2.0000 mg | ORAL_CAPSULE | Freq: Every day | ORAL | 1 refills | Status: DC

## 2016-08-25 MED ORDER — FOLIC ACID 1 MG PO TABS: 1.0000 mg | ORAL_TABLET | Freq: Every day | ORAL | 0 refills | Status: DC

## 2016-08-25 MED ORDER — DULOXETINE HCL 60 MG PO CPEP: 60.0000 mg | ORAL_CAPSULE | Freq: Every day | ORAL | 2 refills | Status: DC

## 2016-08-25 MED ORDER — HYDROCODONE-ACETAMINOPHEN 7.5-325 MG PO TABS: 1.0000 | ORAL_TABLET | Freq: Four times a day (QID) | ORAL | 0 refills | Status: DC | PRN

## 2016-08-25 MED ORDER — ALPRAZOLAM 0.5 MG PO TABS: 0.5000 mg | ORAL_TABLET | Freq: Every evening | ORAL | 0 refills | Status: DC | PRN

## 2016-08-25 MED ORDER — METFORMIN HCL 1000 MG PO TABS: 1000.0000 mg | ORAL_TABLET | Freq: Two times a day (BID) | ORAL | 0 refills | Status: DC

## 2016-08-25 MED ORDER — INSULIN NPH (HUMAN) (ISOPHANE) 100 UNIT/ML ~~LOC~~ SUSP: SUBCUTANEOUS | 3 refills | Status: DC

## 2016-08-25 NOTE — Assessment & Plan Note (Signed)
-   Patient following with rheum as an outpatient - on leflunomide - Currently symptoms are better controlled but still with significant joint pain - improved with norco prn - Will refill folic acid today

## 2016-08-25 NOTE — Assessment & Plan Note (Signed)
-   Patient noted to have elevated blood sugars to the 300s in the AM but improves to the 100s later in the day - He is now off liraglutide as he was unable to tolerate it - felt altered - Will increase his nighttime NPH insulin to 20 units and continue with 17 units in AM  - c/w metformin for now

## 2016-08-25 NOTE — Assessment & Plan Note (Addendum)
-   patient has a history of chronic allergic rhinitis - Has been on loratidine chronically - [facility] and would like to continue this as it has helped significantly. Will continue for now

## 2016-08-25 NOTE — Assessment & Plan Note (Signed)
BP Readings from Last 3 Encounters:  08/25/16 (!) 151/63  08/03/16 (!) 163/79  06/30/16 138/68    Lab Results  Component Value Date   NA 136 03/02/2016   K 4.3 03/02/2016   CREATININE 0.82 03/02/2016    Assessment: Blood pressure control:  fair Progress toward BP goal:   improved Comments: compliant with terazosin, lisinopril and HCTZ  Plan: Medications:  continue current medications Educational resources provided:   Self management tools provided:   Other plans: Patient still with mildly elevated BP. Will recheck on follow up and if elevated would increase his lisinopril to 20 mg

## 2016-08-25 NOTE — Assessment & Plan Note (Signed)
-   Patient with increased anxiety over the holidays and has been using his xanax more frequently. Last dose was yesterday - Will check UDS today and refill meds - He is scheduled for follow up with psych in January given his anxiety and depression - c/w cymbalta for now

## 2016-08-25 NOTE — Assessment & Plan Note (Signed)
-   Uncertain etiology of pruritis but likely secondary to dry skin - No visible rash noted over abdomen but does have excoriations. Possibly secondary to dry skin - Advised moisturizing lotion and follow up if pruritis worsens

## 2016-08-25 NOTE — Progress Notes (Signed)
   Subjective:    Patient ID: Riley Jones, male    DOB: February 28, 1956, 60 y.o.   MRN: [id number]  HPI  Patient here for routine follow up of his DM and HTN.  He states he is compliant with his meds and has no new complaints. He does note elevated blood sugars in the morning to the 300s but improves to the 100s later in the day. He was started on liraglutide earlier this year but was not able to tolerate it.  He does complain of increased joint pain over the last 2-3 weeks and has been taking his narco more regularly. He attributes this to increased yard work that he has had to do.  He also has been taking his xanax regularly as well. He states he has increased anxiety around this time of year. He does have an appointment with psychiatry in Jan. He continues to smoke and has not been able to cut down since his last visit. [facility] and states his allergies have much improved on this medication. He does complain of intermittent itching on his abdomen over the last week with no rash noted.   Review of Systems  Constitutional: Negative.   HENT: Negative.   Eyes: Negative.   Respiratory: Negative.   Cardiovascular: Negative.   Gastrointestinal: Negative.   Musculoskeletal: Positive for arthralgias and joint swelling. Negative for myalgias.  Skin: Positive for rash.  Allergic/Immunologic: Positive for environmental allergies.  Neurological: Negative.   Psychiatric/Behavioral: Negative.        Objective:   Physical Exam  Constitutional: He is oriented to person, place, and time. He appears well-developed and well-nourished.  HENT:  Head: Normocephalic and atraumatic.  Mouth/Throat: No oropharyngeal exudate.  Neck: Normal range of motion. Neck supple.  Cardiovascular: Normal rate, regular rhythm and normal heart sounds.   Pulmonary/Chest: Effort normal. No respiratory distress. He has no wheezes.  Abdominal: Soft. Bowel sounds are  normal. He exhibits no distension. There is no tenderness.  Musculoskeletal: Normal range of motion. He exhibits no edema.  Lymphadenopathy:    He has no cervical adenopathy.  Neurological: He is alert and oriented to person, place, and time.  Skin: Skin is warm.  Noted to have excoriations over his abdomen in the periumbilical region. No discrete rash noted  Psychiatric: He has a normal mood and affect. His behavior is normal.          Assessment & Plan:  Please see problem based charting for assessment and plan:

## 2016-08-25 NOTE — Assessment & Plan Note (Signed)
-   His symptoms are moderately well controlled on duloxetine - He is scheduled for an appointment with psych in Jan

## 2016-08-25 NOTE — Assessment & Plan Note (Signed)
-   has had 2 negative UDS for norco and alprazolam - Discussed with patient in detail on last visit who states he only uses these meds as needed and had not taken a recent dose on his last visit - His last dose of xanax was yesterday and norco was this morning per patient - Will check UDS today and refill meds - Will closely monitor UDS on patient and how often he is requiring these medications and consider tapering them off if possible

## 2016-08-25 NOTE — Patient Instructions (Addendum)
-   It was a pleasure seeing you today - We will increase your night time insulin to 20 units. Continue with 17 units in the morning - We have stopped your pravastatin and started lipitor 40 mg - We will check your urine today - Please follow up in 3 months - Happy holidays!!

## 2016-08-25 NOTE — Assessment & Plan Note (Signed)
-   Still continues to smoke - Has not been able to cut back at this time - Urged to try and quit - I will attempt to address this in detail with him on his next visit

## 2016-08-31 LAB — TOXASSURE SELECT,+ANTIDEPR,UR

## 2016-09-22 ENCOUNTER — Other Ambulatory Visit: Payer: Self-pay | Admitting: Internal Medicine

## 2016-09-22 DIAGNOSIS — Z79891 Long term (current) use of opiate analgesic: Secondary | ICD-10-CM

## 2016-09-22 DIAGNOSIS — F411 Generalized anxiety disorder: Secondary | ICD-10-CM

## 2016-09-23 ENCOUNTER — Other Ambulatory Visit: Payer: Self-pay | Admitting: *Deleted

## 2016-09-23 DIAGNOSIS — F411 Generalized anxiety disorder: Secondary | ICD-10-CM

## 2016-09-23 DIAGNOSIS — Z79891 Long term (current) use of opiate analgesic: Secondary | ICD-10-CM

## 2016-09-23 MED ORDER — HYDROCODONE-ACETAMINOPHEN 7.5-325 MG PO TABS: 1.0000 | ORAL_TABLET | Freq: Four times a day (QID) | ORAL | 0 refills | Status: DC | PRN

## 2016-09-23 MED ORDER — ALPRAZOLAM 0.5 MG PO TABS: 0.5000 mg | ORAL_TABLET | Freq: Every evening | ORAL | 0 refills | Status: DC | PRN

## 2016-09-23 NOTE — Telephone Encounter (Signed)
Reviewed [place] narcotic database, appropriate refill history X 12 months.  Last UDS had expected medications show up.  Will refill 1 month supply of both and have Dr. Dareen Piano prescribe at next needed refill.

## 2016-09-24 ENCOUNTER — Other Ambulatory Visit: Payer: Self-pay | Admitting: Internal Medicine

## 2016-09-24 DIAGNOSIS — F411 Generalized anxiety disorder: Secondary | ICD-10-CM

## 2016-09-24 DIAGNOSIS — Z79891 Long term (current) use of opiate analgesic: Secondary | ICD-10-CM

## 2016-09-24 NOTE — Telephone Encounter (Signed)
Lm for pt Called xanax to pharm

## 2016-09-30 ENCOUNTER — Ambulatory Visit (HOSPITAL_COMMUNITY): Payer: Commercial Managed Care - HMO | Admitting: Psychiatry

## 2016-10-09 ENCOUNTER — Other Ambulatory Visit: Payer: Self-pay | Admitting: Internal Medicine

## 2016-10-12 ENCOUNTER — Other Ambulatory Visit: Payer: Self-pay | Admitting: Infectious Diseases

## 2016-10-23 DIAGNOSIS — J449 Chronic obstructive pulmonary disease, unspecified: Secondary | ICD-10-CM | POA: Diagnosis not present

## 2016-10-23 DIAGNOSIS — M069 Rheumatoid arthritis, unspecified: Secondary | ICD-10-CM | POA: Diagnosis not present

## 2016-10-23 DIAGNOSIS — I25110 Atherosclerotic heart disease of native coronary artery with unstable angina pectoris: Secondary | ICD-10-CM | POA: Diagnosis not present

## 2016-10-23 DIAGNOSIS — G5601 Carpal tunnel syndrome, right upper limb: Secondary | ICD-10-CM | POA: Diagnosis not present

## 2016-10-23 DIAGNOSIS — Z720 Tobacco use: Secondary | ICD-10-CM | POA: Diagnosis not present

## 2016-10-23 DIAGNOSIS — Z6828 Body mass index (BMI) 28.0-28.9, adult: Secondary | ICD-10-CM | POA: Diagnosis not present

## 2016-10-23 DIAGNOSIS — M4120 Other idiopathic scoliosis, site unspecified: Secondary | ICD-10-CM | POA: Diagnosis not present

## 2016-10-23 DIAGNOSIS — E1065 Type 1 diabetes mellitus with hyperglycemia: Secondary | ICD-10-CM | POA: Diagnosis not present

## 2016-10-23 DIAGNOSIS — Z21 Asymptomatic human immunodeficiency virus [HIV] infection status: Secondary | ICD-10-CM | POA: Diagnosis not present

## 2016-10-26 ENCOUNTER — Other Ambulatory Visit: Payer: Self-pay | Admitting: Internal Medicine

## 2016-10-26 DIAGNOSIS — Z79891 Long term (current) use of opiate analgesic: Secondary | ICD-10-CM

## 2016-10-26 DIAGNOSIS — F411 Generalized anxiety disorder: Secondary | ICD-10-CM

## 2016-10-26 MED ORDER — HYDROCODONE-ACETAMINOPHEN 7.5-325 MG PO TABS
1.0000 | ORAL_TABLET | Freq: Four times a day (QID) | ORAL | 0 refills | Status: DC | PRN
Start: 2016-10-26 — End: 2016-11-25

## 2016-10-26 MED ORDER — ALPRAZOLAM 0.5 MG PO TABS: 0.5000 mg | ORAL_TABLET | Freq: Every evening | ORAL | 0 refills | Status: DC | PRN

## 2016-10-29 ENCOUNTER — Other Ambulatory Visit: Payer: Medicare HMO

## 2016-10-29 DIAGNOSIS — B20 Human immunodeficiency virus [HIV] disease: Secondary | ICD-10-CM | POA: Diagnosis not present

## 2016-10-29 LAB — CBC WITH DIFFERENTIAL/PLATELET
BASOS PCT: 1 %
Basophils Absolute: 52 cells/uL (ref 0–200)
Eosinophils Absolute: 156 cells/uL (ref 15–500)
Eosinophils Relative: 3 %
HCT: 43.8 % (ref 38.5–50.0)
HEMOGLOBIN: 15.1 g/dL (ref 13.2–17.1)
LYMPHS ABS: 1508 {cells}/uL (ref 850–3900)
LYMPHS PCT: 29 %
MCH: 32.1 pg (ref 27.0–33.0)
MCHC: 34.5 g/dL (ref 32.0–36.0)
MCV: 93.2 fL (ref 80.0–100.0)
MONO ABS: 572 {cells}/uL (ref 200–950)
MPV: 10.5 fL (ref 7.5–12.5)
Monocytes Relative: 11 %
Neutro Abs: 2912 cells/uL (ref 1500–7800)
Neutrophils Relative %: 56 %
Platelets: 122 10*3/uL — ABNORMAL LOW (ref 140–400)
RBC: 4.7 MIL/uL (ref 4.20–5.80)
RDW: 13.8 % (ref 11.0–15.0)
WBC: 5.2 10*3/uL (ref 3.8–10.8)

## 2016-10-30 LAB — COMPLETE METABOLIC PANEL WITH GFR
ALT: 40 U/L (ref 9–46)
AST: 34 U/L (ref 10–35)
Albumin: 4.4 g/dL (ref 3.6–5.1)
Alkaline Phosphatase: 77 U/L (ref 40–115)
BILIRUBIN TOTAL: 0.6 mg/dL (ref 0.2–1.2)
BUN: 13 mg/dL (ref 7–25)
CALCIUM: 9.6 mg/dL (ref 8.6–10.3)
CO2: 24 mmol/L (ref 20–31)
Chloride: 99 mmol/L (ref 98–110)
Creat: 0.95 mg/dL (ref 0.70–1.25)
GFR, EST NON AFRICAN AMERICAN: 85 mL/min (ref >=60)
GLUCOSE: 94 mg/dL (ref 65–99)
Potassium: 3.7 mmol/L (ref 3.5–5.3)
SODIUM: 136 mmol/L (ref 135–146)
TOTAL PROTEIN: 7 g/dL (ref 6.1–8.1)

## 2016-10-30 LAB — T-HELPER CELL (CD4) - (RCID CLINIC ONLY)
CD4 T CELL ABS: 870 /uL (ref 400–2700)
CD4 T CELL HELPER: 47 % (ref 33–55)

## 2016-11-02 LAB — HIV-1 RNA QUANT-NO REFLEX-BLD
HIV 1 RNA Quant: <20 copies/mL
HIV-1 RNA QUANT, LOG: NOT DETECTED {Log_copies}/mL

## 2016-11-12 ENCOUNTER — Ambulatory Visit (INDEPENDENT_AMBULATORY_CARE_PROVIDER_SITE_OTHER): Payer: Medicare HMO | Admitting: Infectious Diseases

## 2016-11-12 ENCOUNTER — Encounter: Payer: Self-pay | Admitting: Infectious Diseases

## 2016-11-12 VITALS — BP 160/78 | HR 76 | Temp 98.7°F | Wt 196.0 lb

## 2016-11-12 DIAGNOSIS — F17200 Nicotine dependence, unspecified, uncomplicated: Secondary | ICD-10-CM | POA: Diagnosis not present

## 2016-11-12 DIAGNOSIS — Z113 Encounter for screening for infections with a predominantly sexual mode of transmission: Secondary | ICD-10-CM

## 2016-11-12 DIAGNOSIS — B20 Human immunodeficiency virus [HIV] disease: Secondary | ICD-10-CM | POA: Diagnosis not present

## 2016-11-12 DIAGNOSIS — E118 Type 2 diabetes mellitus with unspecified complications: Secondary | ICD-10-CM

## 2016-11-12 DIAGNOSIS — I200 Unstable angina: Secondary | ICD-10-CM | POA: Diagnosis not present

## 2016-11-12 DIAGNOSIS — J42 Unspecified chronic bronchitis: Secondary | ICD-10-CM | POA: Diagnosis not present

## 2016-11-12 DIAGNOSIS — Z79899 Other long term (current) drug therapy: Secondary | ICD-10-CM

## 2016-11-12 DIAGNOSIS — Z0000 Encounter for general adult medical examination without abnormal findings: Secondary | ICD-10-CM

## 2016-11-12 NOTE — Assessment & Plan Note (Signed)
He is doing very well.  Consider changing to odefsy or genvoya. He wishes to stay with atripla.  Has had flu shot.  Offered/refused condoms.  rtc in 6-12 months

## 2016-11-12 NOTE — Assessment & Plan Note (Signed)
He had trouble with chantix, patches (rash), doesn't like gum,  Encouraged pt to quit smoking.

## 2016-11-12 NOTE — Assessment & Plan Note (Signed)
Repeat colon after 5 years from prev due to polyps.

## 2016-11-12 NOTE — Assessment & Plan Note (Signed)
Consider re-eval by pulm.

## 2016-11-12 NOTE — Assessment & Plan Note (Signed)
Has taken NTG twice.  Not sure he needed.  Will cont on ntg, asa, statin.  Lipids with next visit.  My great appreciation to Dr Dareen Piano.

## 2016-11-12 NOTE — Progress Notes (Signed)
   Subjective:    Patient ID: Riley Jones, male    DOB: Oct 31, 1955, 61 y.o.   MRN: [id number]  HPI 61 yo M with hx of HIV+ (~50yrs), DM2 (>31yrs, on insulin) and RA (took MTX without relief, off prednisone). He has been maintained on atripla (has been on multiple previous meds).  Also hx of smoking. CAD s/p DES to mid LAD in 06/2014, his EF 55-65% at that time. He had a myoview 11-2014- low risk.  He was recently seen by [facility] for continued episodes of CP.  He continues to have difficulty with sinus fullness. Has seen allergy previously.  Had one episode of night sweat.  He also continues to smoke.    HIV 1 RNA Quant (copies/mL)  Date Value  10/29/2016 <20 NOT DETECTED  03/02/2016 <20  09/03/2015 <20   CD4 T Cell Abs (/uL)  Date Value  10/29/2016 870  03/02/2016 840  09/03/2015 710   Has had some easy bruising, bleeding. On asa.  PLT 122 His last A1c was 10%  Review of Systems  Constitutional: Negative for appetite change, chills, fever and unexpected weight change.  HENT: Positive for congestion.   Respiratory: Positive for cough and shortness of breath.   Cardiovascular: Negative for leg swelling.  Gastrointestinal: Negative for constipation and diarrhea.  Genitourinary: Negative for difficulty urinating.  sputum only in AM.  Mild DOE, has hx of COPD.     Objective:   Physical Exam  Constitutional: He appears well-developed and well-nourished.  HENT:  Mouth/Throat: No oropharyngeal exudate.  Eyes: EOM are normal. Pupils are equal, round, and reactive to light.  Neck: Neck supple.  Cardiovascular: Normal rate, regular rhythm and normal heart sounds.   Pulmonary/Chest: Effort normal and breath sounds normal.  Abdominal: Soft. Bowel sounds are normal. There is no tenderness. There is no rebound.  Musculoskeletal: He exhibits no edema.  Lymphadenopathy:    He has no cervical adenopathy.      Assessment & Plan:

## 2016-11-12 NOTE — Assessment & Plan Note (Signed)
He is working on better [employer] his wt.  Greatly appreciate Dr Wilber Bihari excellent f/u.

## 2016-11-25 ENCOUNTER — Other Ambulatory Visit: Payer: Self-pay | Admitting: Internal Medicine

## 2016-11-25 DIAGNOSIS — F411 Generalized anxiety disorder: Secondary | ICD-10-CM

## 2016-11-25 DIAGNOSIS — Z79891 Long term (current) use of opiate analgesic: Secondary | ICD-10-CM

## 2016-11-26 MED ORDER — HYDROCODONE-ACETAMINOPHEN 7.5-325 MG PO TABS: 1.0000 | ORAL_TABLET | Freq: Four times a day (QID) | ORAL | 0 refills | Status: DC | PRN

## 2016-11-26 MED ORDER — ALPRAZOLAM 0.5 MG PO TABS: 0.5000 mg | ORAL_TABLET | Freq: Every evening | ORAL | 0 refills | Status: DC | PRN

## 2016-11-27 NOTE — Telephone Encounter (Signed)
Hydrocodone rx ready -pt called, no answer; left message.

## 2016-12-01 ENCOUNTER — Encounter: Payer: Self-pay | Admitting: Internal Medicine

## 2016-12-01 ENCOUNTER — Ambulatory Visit (INDEPENDENT_AMBULATORY_CARE_PROVIDER_SITE_OTHER): Payer: Medicare HMO | Admitting: Internal Medicine

## 2016-12-01 VITALS — BP 144/70 | HR 67 | Temp 97.5°F | Ht 70.0 in | Wt 199.0 lb

## 2016-12-01 DIAGNOSIS — J449 Chronic obstructive pulmonary disease, unspecified: Secondary | ICD-10-CM | POA: Diagnosis not present

## 2016-12-01 DIAGNOSIS — E1165 Type 2 diabetes mellitus with hyperglycemia: Secondary | ICD-10-CM | POA: Diagnosis not present

## 2016-12-01 DIAGNOSIS — I10 Essential (primary) hypertension: Secondary | ICD-10-CM | POA: Diagnosis not present

## 2016-12-01 DIAGNOSIS — Z79891 Long term (current) use of opiate analgesic: Secondary | ICD-10-CM | POA: Diagnosis not present

## 2016-12-01 DIAGNOSIS — J42 Unspecified chronic bronchitis: Secondary | ICD-10-CM

## 2016-12-01 DIAGNOSIS — K219 Gastro-esophageal reflux disease without esophagitis: Secondary | ICD-10-CM | POA: Diagnosis not present

## 2016-12-01 DIAGNOSIS — E118 Type 2 diabetes mellitus with unspecified complications: Secondary | ICD-10-CM

## 2016-12-01 DIAGNOSIS — J310 Chronic rhinitis: Secondary | ICD-10-CM | POA: Diagnosis not present

## 2016-12-01 DIAGNOSIS — M069 Rheumatoid arthritis, unspecified: Secondary | ICD-10-CM | POA: Diagnosis not present

## 2016-12-01 DIAGNOSIS — Z7902 Long term (current) use of antithrombotics/antiplatelets: Secondary | ICD-10-CM | POA: Diagnosis not present

## 2016-12-01 DIAGNOSIS — Z79899 Other long term (current) drug therapy: Secondary | ICD-10-CM

## 2016-12-01 DIAGNOSIS — Z794 Long term (current) use of insulin: Secondary | ICD-10-CM | POA: Diagnosis not present

## 2016-12-01 DIAGNOSIS — Z0000 Encounter for general adult medical examination without abnormal findings: Secondary | ICD-10-CM

## 2016-12-01 DIAGNOSIS — F17219 Nicotine dependence, cigarettes, with unspecified nicotine-induced disorders: Secondary | ICD-10-CM

## 2016-12-01 DIAGNOSIS — Z716 Tobacco abuse counseling: Secondary | ICD-10-CM | POA: Diagnosis not present

## 2016-12-01 DIAGNOSIS — F17210 Nicotine dependence, cigarettes, uncomplicated: Secondary | ICD-10-CM | POA: Diagnosis not present

## 2016-12-01 DIAGNOSIS — F411 Generalized anxiety disorder: Secondary | ICD-10-CM

## 2016-12-01 DIAGNOSIS — F32A Depression, unspecified: Secondary | ICD-10-CM

## 2016-12-01 DIAGNOSIS — I200 Unstable angina: Secondary | ICD-10-CM

## 2016-12-01 DIAGNOSIS — J3089 Other allergic rhinitis: Secondary | ICD-10-CM

## 2016-12-01 DIAGNOSIS — F329 Major depressive disorder, single episode, unspecified: Secondary | ICD-10-CM

## 2016-12-01 LAB — GLUCOSE, CAPILLARY: GLUCOSE-CAPILLARY: 340 mg/dL — AB (ref 65–99)

## 2016-12-01 LAB — POCT GLYCOSYLATED HEMOGLOBIN (HGB A1C): Hemoglobin A1C: 9.6

## 2016-12-01 MED ORDER — FOLIC ACID 1 MG PO TABS: 1.0000 mg | ORAL_TABLET | Freq: Every day | ORAL | 3 refills | Status: DC

## 2016-12-01 MED ORDER — EMPAGLIFLOZIN 10 MG PO TABS: 10.0000 mg | ORAL_TABLET | Freq: Every day | ORAL | 3 refills | Status: DC

## 2016-12-01 MED ORDER — ATORVASTATIN CALCIUM 40 MG PO TABS: 40.0000 mg | ORAL_TABLET | Freq: Every day | ORAL | 3 refills | Status: DC

## 2016-12-01 MED ORDER — DULOXETINE HCL 60 MG PO CPEP: 60.0000 mg | ORAL_CAPSULE | Freq: Every day | ORAL | 2 refills | Status: DC

## 2016-12-01 MED ORDER — LORATADINE 10 MG PO TABS: 10.0000 mg | ORAL_TABLET | Freq: Every day | ORAL | 3 refills | Status: AC

## 2016-12-01 MED ORDER — LISINOPRIL 20 MG PO TABS: 20.0000 mg | ORAL_TABLET | Freq: Every day | ORAL | 3 refills | Status: DC

## 2016-12-01 MED ORDER — RANITIDINE HCL 150 MG PO TABS
150.0000 mg | ORAL_TABLET | Freq: Two times a day (BID) | ORAL | 1 refills | Status: DC
Start: 2016-12-01 — End: 2017-02-04

## 2016-12-01 MED ORDER — METFORMIN HCL 1000 MG PO TABS: 1000.0000 mg | ORAL_TABLET | Freq: Two times a day (BID) | ORAL | 0 refills | Status: DC

## 2016-12-01 MED ORDER — BUPROPION HCL ER (SMOKING DET) 150 MG PO TB12: 150.0000 mg | ORAL_TABLET | Freq: Two times a day (BID) | ORAL | 1 refills | Status: DC

## 2016-12-01 NOTE — Progress Notes (Signed)
   Subjective:    Patient ID: Riley Jones, male    DOB: Aug 03, 1956, 61 y.o.   MRN: [id number]  HPI I have seen and examined the patient. Patient is here for routine follow up of his diabetes and HTN.  He states he feels well and is compliant with his medications. He has not been able to follow up with the psychiatrist in January as he had a family emergency in another state. He states he feels well on Cymbalta but will try and reschedule his appointment with the psychiatrist for his depression and anxiety. Patient still takes Xanax at least every other day for anxiety especially at night to help him sleep.  He does note worsening pain in his hands and back especially since he has been working in his garden more. He states he averages approximately 2 pain pills every day. He is following up with his rheumatologist for his rheumatoid arthritis.  He states his blood sugars have been better over the last couple of months and hopes his A1c is doing better. He denies any other complaints for now.   Review of Systems  Constitutional: Negative.  Negative for chills, fatigue and fever.  HENT: Negative.   Respiratory: Negative.   Cardiovascular: Negative.   Gastrointestinal: Negative.   Musculoskeletal: Positive for arthralgias and back pain.  Neurological: Negative.   Psychiatric/Behavioral: Negative.        Objective:   Physical Exam  Constitutional: He is oriented to person, place, and time. He appears well-developed and well-nourished.  HENT:  Head: Normocephalic and atraumatic.  Neck: Normal range of motion. Neck supple.  Cardiovascular: Normal rate, regular rhythm and normal heart sounds.   No murmur heard. Pulmonary/Chest: Effort normal and breath sounds normal. No respiratory distress. He has no wheezes.  Abdominal: Soft. Bowel sounds are normal. He exhibits no distension. There is no tenderness.  Musculoskeletal: Normal range of motion. He exhibits no edema.  Lymphadenopathy:    He has no cervical adenopathy.  Neurological: He is alert and oriented to person, place, and time.  Skin: Skin is warm. No rash noted. No erythema.  Psychiatric: He has a normal mood and affect. His behavior is normal.          Assessment & Plan:  Please see problem based charting for assessment and plan:

## 2016-12-01 NOTE — Assessment & Plan Note (Addendum)
-   Patient's symptoms are well controlled on albuterol prn and singulair - Will recheck PFTs - Patient still smoking approx 1 PPD. Smoking cessation addressed once again this visit

## 2016-12-01 NOTE — Assessment & Plan Note (Signed)
-   Patient states he continues to have intermittent rhinorrhea and nasal congestion but symptoms are well controlled on loratidine and singulair - loratidine refilled today

## 2016-12-01 NOTE — Assessment & Plan Note (Signed)
BP Readings from Last 3 Encounters:  12/01/16 (!) 144/70  11/12/16 (!) 160/78  08/25/16 (!) 151/63    Lab Results  Component Value Date   NA 136 10/29/2016   K 3.7 10/29/2016   CREATININE 0.95 10/29/2016    Assessment: Blood pressure control:  fair Progress toward BP goal:   improved Comments: compliant with HCTZ, lisinopril and terazosin  Plan: Medications:  Will increase his lisinopril to 20 mg given his perisistently elevated BP and consider consolidatiing his HCTZ and lisinoppril to 1 pill if he tolerates the increase dose Educational resources provided: brochure (denies need ) Self management tools provided:   Other plans: Recheck BMP on follow up

## 2016-12-01 NOTE — Patient Instructions (Signed)
-   It was a pleasure seeing you today - I have increased your lisinopril to 20 mg for your elevated blood pressure - I have started you on zyban to help with smoking cessation. Start taking it once a day for 3 days and then increase it to twice daily. Stop smoking within a week of starting this medication - Your A1C remains high. Will start you on empagliflozin for your diabetes in addition to the insulin and metformin - Please make an appointment with your eye doctor - Please follow up for your PFTs - We will set up a CT of your lungs for lung cancer screening - I have stopped the omeprazole and started you on ranitidine for your reflux

## 2016-12-01 NOTE — Assessment & Plan Note (Addendum)
-   Patient was supposed to follow up with psychiatry - Dr. Casimiro Needle for the first time but missed his appointment secondary to a family emergency - He will reschedule his appointment for the next available one - Will c/w xanax prn for now. C/w cymbalta. Would like to eventually taper him off the xanax especially since he has only refilled his xanax sporadically per the [place] narcotic database

## 2016-12-01 NOTE — Assessment & Plan Note (Addendum)
-   Patient to f/u with Dr. Johnsie Cancel this month - He denies any chest pain currently.  He reported taking NTG twice to Dr. Johnnye Sima - Will c/w plavix, statin and NTG. - I wonder if he would benefit from a beta blocker. Will defer to cardiology. Last stress test with no ischemia

## 2016-12-01 NOTE — Assessment & Plan Note (Signed)
-   Patient's last UDS was appropriate - [place] narcotic database was reviewed. Has refilled his narco appropriately - The current medication enables patient to do his daily activities and the benefits currently outweigh the risks

## 2016-12-01 NOTE — Assessment & Plan Note (Signed)
  Assessment: Progress toward smoking cessation:   still smoking 1 PPD Barriers to progress toward smoking cessation:   anxiety/depression Comments: He is willing to try and quit. The nicotine patch gave him a rash and he had side effects with the chantix  Plan: Instruction/counseling given:  I counseled patient on the dangers of tobacco use, advised patient to stop smoking, and reviewed strategies to maximize success. Educational resources provided:  [employer] account manager) brochure (Denies need ) Self management tools provided:    Medications to assist with smoking cessation:  Bupropion (Zyban) Patient agreed to the following self-care plans for smoking cessation: go to the [employer] (https://scott-booker.info/), cut down the number of cigarettes smoked, set a quit date and stop smoking  Other plans: Will start patient on zyban and have him set a quit date for 1 week after starting the medication. Time spent on smoking cessation counseling was 8 minutes

## 2016-12-01 NOTE — Assessment & Plan Note (Signed)
-   Patient has been on omeprazole chronically for his GERD and states his symptoms are well controlled on his current medication - His omeprazole does have a possible interaction with his clopidogrel and possibly decreases its efficacy. - Will d/c omeprazole and start ranitidine and monitor his symptoms

## 2016-12-01 NOTE — Assessment & Plan Note (Signed)
-   Patient had a flu shot at [facility]. Will obtain records and put it in the chart - Will get a screening CT for lung cancer given his smoking history of more than 30 pack years and is an active smoker between the age of 30 and 48

## 2016-12-01 NOTE — Assessment & Plan Note (Signed)
-   Patient follows with his rheumatologist and is on leflunomide and folic acid - States his joint pain has been worse in his wrists and back over the last couple of months which he attributes to gardening - He will need to f/u with his rheumatologist - c/w narco for his pain control

## 2016-12-01 NOTE — Assessment & Plan Note (Signed)
Lab Results  Component Value Date   HGBA1C 9.6 12/01/2016   HGBA1C 9.6 06/30/2016   HGBA1C 7.9 02/25/2016     Assessment:  Diabetes control:  poorly controlled Progress toward A1C goal:   same Comments: patient is compliant with his metformin and NPH insulin. His average blood sugar is 218.  Plan: Medications:   Will start patient on empagliflozin for his poorly controlled DM and titrate up as necessary. Maintain current doses of insulin and metformin Home glucose monitoring: Frequency:   Timing:   Instruction/counseling given: reminded to get eye exam, reminded to bring blood glucose meter & log to each visit and discussed the need for weight loss Educational resources provided: brochure (denies need ) Self management tools provided: copy of home glucose meter download Other plans: Will check BMP on follow up visit

## 2016-12-03 NOTE — Progress Notes (Signed)
Patient ID: Riley Jones, male   DOB: 09-21-1956, 61 y.o.   MRN: [id number]    Cardiology Office Note   Date:  12/15/2016   ID:  Riley Jones, DOB [birth year], MRN [id number]  PCP:  Aldine Contes, MD  Cardiologist:  Dr. Jenkins Rouge     No chief complaint on file.    History of Present Illness: Riley Jones is a 61 y.o. male with a hx of CAD s/p DES to mid LAD in 06/2014, DM2, HL, HIV, COPD, tobacco abuse.  Some atypical chest Pain in March  2016 related to anxiety and food.  F/U myovue reviewed and no ischemia Seeing primary care and ID at [facility]  Struggling with smoking and DM control   Studies/Reports Reviewed Today:  Myoview 12/07/14 Normal stress nuclear study. No evidence of ischemia.LVEF: 57%.  CXR 11/26/14 IMPRESSION:  No active cardiopulmonary disease. Stable chronic bronchitic changes. Degenerative changes thoracic spine.  Cardiac Cath/PCI 10/15 LM: Normal LAD:   40-50% proximal 80% mid at D2 take off shelf like lesion LCx: normal RCA: Normal PDA: 30% proximal EF 55-65%, there is no significant mitral regurgitation  PCI:  3.0 x 15 mm Xience alpine DES to Mid LAD  Lab Results  Component Value Date   LDLCALC 96 03/02/2016   Lab Results  Component Value Date   HGBA1C 9.6 12/01/2016   But use to be 10.  Still smoking PPD.  Counseled for less than 10 minutes   Still working at [employer] in [place].  Lonely at times.  Has two  dogs and a male friend.      Past Medical History:  Diagnosis Date  . Anxiety   . Arthritis    "pretty much all over"  . Asthma    "mild" (07/17/2014)  . Atypical angina ([place])    Myoview 2003, Normal EF 64%  . CAD (coronary artery disease)    a. 10/20 DES to 80% mid LAD  . COPD (chronic obstructive pulmonary disease) ([place])    "mild" (07/17/2014)  . Depression   . Foot pain 03/30/2011  . GERD (gastroesophageal reflux disease)   . H/O hiatal hernia   . Headache    "weekly" (06/2014)  . History of gout   . HIV  (human immunodeficiency virus infection) ([place]) 1995   DX after shingles followed by Dr. Ola Spurr (ID)  . Hx of cardiovascular stress test    ETT-Myoview (3/16):  Ex 10:15, No Ischemia, EF 57%; Normal Study  . Hyperlipidemia   . Hypertension    Well Controlled off meds  . Kidney stones    "passed them all"  . Pneumonia 4-5 times  . Rheumatoid arthritis(714.0)    Followed by Dr. Charlestine Night, off MTX and prdnisone since 2007  . Sleep apnea    does not wear mask (07/17/2014)  . Type II diabetes mellitus ([place])     Past Surgical History:  Procedure Laterality Date  . APPENDECTOMY  ~ 1981  . CARPAL TUNNEL RELEASE Left 1990's  . CORONARY ANGIOPLASTY WITH STENT PLACEMENT  07/17/2014   a. DES to 80% mid LAD  . HERNIA REPAIR    . LAPAROSCOPIC INCISIONAL / UMBILICAL / VENTRAL HERNIA REPAIR  1990's   "it was a double; not inguinal"  . LEFT HEART CATHETERIZATION WITH CORONARY ANGIOGRAM N/A 07/17/2014   Procedure: LEFT HEART CATHETERIZATION WITH CORONARY ANGIOGRAM;  Surgeon: [name], MD;  Location[facility] CATH LAB;  Service: Cardiovascular;  Laterality: N/A;  . PERCUTANEOUS STENT INTERVENTION  07/17/2014  Procedure: PERCUTANEOUS STENT INTERVENTION;  Surgeon: [name], MD;  Location: [facility] CATH LAB;  Service: Cardiovascular;;  . TONSILLECTOMY AND ADENOIDECTOMY  1990's  . TRIGGER FINGER RELEASE Left 1990's   2nd & 5th digits  . UMBILICAL HERNIA REPAIR  1980's   w/mesh  . UVULOPALATOPHARYNGOPLASTY (UPPP)/TONSILLECTOMY/SEPTOPLASTY  1990's     Current Outpatient Prescriptions  Medication Sig Dispense Refill  . ACCU-CHEK SMARTVIEW test strip CHECK BLOOD SUGAR 4 TIMES A DAY 150 each 6  . albuterol (VENTOLIN HFA) 108 (90 BASE) MCG/ACT inhaler Inhale 1 puff into the lungs every 4 (four) hours as needed. for shortness of breath. Or may use every 6 hours as needed.    . ALPRAZolam (XANAX) 0.5 MG tablet Take 1 tablet (0.5 mg total) by mouth at bedtime as needed for anxiety. 30 tablet 0  .  aspirin 81 MG EC tablet Take 81 mg by mouth daily.      Marland Kitchen atorvastatin (LIPITOR) 40 MG tablet Take 1 tablet (40 mg total) by mouth daily. 30 tablet 3  . ATRIPLA 600-200-300 MG tablet TAKE 1 TABLET BY MOUTH DAILY 30 tablet 5  . buPROPion (ZYBAN) 150 MG 12 hr tablet Take 1 tablet (150 mg total) by mouth 2 (two) times daily. 60 tablet 1  . clopidogrel (PLAVIX) 75 MG tablet TAKE ONE TABLET BY MOUTH ONCE DAILY 90 tablet 1  . DULoxetine (CYMBALTA) 60 MG capsule Take 1 capsule (60 mg total) by mouth daily. 30 capsule 2  . empagliflozin (JARDIANCE) 10 MG TABS tablet Take 10 mg by mouth daily. 30 tablet 3  . fluticasone (FLONASE) 50 MCG/ACT nasal spray Place 1 spray into both nostrils daily. 16 g 2  . folic acid (FOLVITE) 1 MG tablet Take 1 tablet (1 mg total) by mouth daily. 90 tablet 3  . hydrochlorothiazide (HYDRODIURIL) 25 MG tablet TAKE ONE TABLET BY MOUTH ONCE DAILY 90 tablet 1  . HYDROcodone-acetaminophen (NORCO) 7.5-325 MG tablet Take 1 tablet by mouth every 6 (six) hours as needed for moderate pain. 60 tablet 0  . insulin NPH Human (NOVOLIN N RELION) 100 UNIT/ML injection Inject 17 units into the skin in the morning and 20 units in the evening before meals 10 mL 3  . Insulin Pen Needle (B-D UF III MINI PEN NEEDLES) 31G X 5 MM MISC Inject victoza once daily 100 each 3  . lisinopril (PRINIVIL,ZESTRIL) 20 MG tablet Take 1 tablet (20 mg total) by mouth daily. 90 tablet 3  . loratadine (CLARITIN) 10 MG tablet Take 1 tablet (10 mg total) by mouth daily. 90 tablet 3  . metFORMIN (GLUCOPHAGE) 1000 MG tablet Take 1 tablet (1,000 mg total) by mouth 2 (two) times daily with a meal. 180 tablet 0  . montelukast (SINGULAIR) 10 MG tablet Take 1 tablet (10 mg total) by mouth daily. 30 tablet 2  . NITROSTAT 0.4 MG SL tablet PLACE 1 TABLET UNDER THE TONGUE EVERY 5 MINUTES AS NEEDED FOR CHEST PAIN 25 tablet 3  . ranitidine (ZANTAC) 150 MG tablet Take 1 tablet (150 mg total) by mouth 2 (two) times daily. 60 tablet 1    . terazosin (HYTRIN) 2 MG capsule Take 1 capsule (2 mg total) by mouth at bedtime. 90 capsule 1   No current facility-administered medications for this visit.     Allergies:   Penicillins; Zoloft [sertraline hcl]; and Chantix [varenicline]    Social History:  The patient  reports that he has been smoking Cigarettes.  He has a 46.00 pack-year smoking  history. He has never used smokeless tobacco. He reports that he drinks about 0.6 oz of alcohol per week . He reports that he does not use drugs.   Family History:  The patient's family history includes Heart attack in his brother and father; Heart disease in his father; Osteoporosis in his mother; Stroke in his maternal grandfather, maternal grandmother, paternal grandfather, and paternal grandmother.    ROS:   Please see the history of present illness.   Review of Systems  Genitourinary:       Erectile Dysfunction  All other systems reviewed and are negative.    PHYSICAL EXAM: VS:  BP 138/78   Pulse 68   Ht 5\' 10"  (1.778 m)   Wt 197 lb 8 oz (89.6 kg)   SpO2 96%   BMI 28.34 kg/m     Wt Readings from Last 3 Encounters:  12/15/16 197 lb 8 oz (89.6 kg)  12/01/16 199 lb (90.3 kg)  11/12/16 196 lb (88.9 kg)     GEN: Well nourished, well developed, in no acute distress  HEENT: normal  Neck: no JVD, no masses Cardiac:  Normal S1/S2, RRR; no murmur, no rubs or gallops, no edema  Respiratory:  clear to auscultation bilaterally, no wheezing, rhonchi or rales. GI: soft, nontender, nondistended, + BS MS: no deformity or atrophy  Skin: warm and dry  Neuro:  CNs II-XII intact, Strength and sensation are intact Psych: Normal affect Bruising on hands   EKG:   11/26/14  SR rate 88 nonspecific ST T wave changes 12/15/16  SR rate 68  RBBB otherwise normal   Recent Labs: 10/29/2016: ALT 40; BUN 13; Creat 0.95; Hemoglobin 15.1; Platelets 122; Potassium 3.7; Sodium 136    Lipid Panel    Component Value Date/Time   CHOL 212 (H) 03/02/2016  1001   TRIG 394 (H) 03/02/2016 1001   HDL 37 (L) 03/02/2016 1001   CHOLHDL 5.7 (H) 03/02/2016 1001   VLDL 79 (H) 03/02/2016 1001   LDLCALC 96 03/02/2016 1001      ASSESSMENT AND PLAN:  1.  CAD: Stent mid LAD 2015 Myovue 12/07/14 non ischemic .  Continue aspirin, Plavix  amlodipine, pravastatin, lisinopril. 2.  HTN:  Controlled.  3.  Hyperlipidemia:  Continue statin. Recent LDL 85.  We discussed working on diet.  Continue current dose of statin.  4.  COPD:  FU with PCP.  CXR today for cough and smoking last one 10/2014 ok  5.  Tobacco Abuse:  He is trying to quit. Counseled less than 10 minutes  6/  HIV  CD 4 counts fine continue 2 drug Rx f/u [facility]  7. RBBB:  New will follow ECG no AV block not likely clinically significant   Current medicines are reviewed at length with the patient today.  The patient does not have concerns regarding medicines.  The following changes have been made: None.   Labs/ tests ordered today include:  Orders Placed This Encounter  Procedures  . EKG 12-Lead    Disposition:   FU with me in  6 months    Jenkins Rouge

## 2016-12-05 LAB — TOXASSURE SELECT,+ANTIDEPR,UR

## 2016-12-07 ENCOUNTER — Encounter: Payer: Self-pay | Admitting: Infectious Diseases

## 2016-12-08 ENCOUNTER — Encounter: Payer: Self-pay | Admitting: Cardiovascular Disease

## 2016-12-08 ENCOUNTER — Ambulatory Visit (HOSPITAL_COMMUNITY)
Admission: RE | Admit: 2016-12-08 | Discharge: 2016-12-08 | Disposition: A | Discharge status: Home / Self Care | Payer: Medicare HMO | Source: Ambulatory Visit | Attending: Internal Medicine | Admitting: Internal Medicine

## 2016-12-08 DIAGNOSIS — F17219 Nicotine dependence, cigarettes, with unspecified nicotine-induced disorders: Secondary | ICD-10-CM | POA: Diagnosis not present

## 2016-12-08 DIAGNOSIS — J42 Unspecified chronic bronchitis: Secondary | ICD-10-CM | POA: Diagnosis not present

## 2016-12-08 DIAGNOSIS — I2510 Atherosclerotic heart disease of native coronary artery without angina pectoris: Secondary | ICD-10-CM | POA: Diagnosis not present

## 2016-12-08 DIAGNOSIS — J439 Emphysema, unspecified: Secondary | ICD-10-CM | POA: Insufficient documentation

## 2016-12-08 DIAGNOSIS — Z122 Encounter for screening for malignant neoplasm of respiratory organs: Secondary | ICD-10-CM | POA: Diagnosis not present

## 2016-12-08 DIAGNOSIS — I700 Atherosclerosis of aorta: Secondary | ICD-10-CM | POA: Insufficient documentation

## 2016-12-08 DIAGNOSIS — Z0000 Encounter for general adult medical examination without abnormal findings: Secondary | ICD-10-CM | POA: Insufficient documentation

## 2016-12-08 LAB — PULMONARY FUNCTION TEST
DL/VA % pred: 103 %
DL/VA: 4.76 ml/min/mmHg/L
DLCO UNC: 29.16 ml/min/mmHg
DLCO cor % pred: 90 %
DLCO cor: 29.16 ml/min/mmHg
DLCO unc % pred: 90 %
FEF 25-75 POST: 1.77 L/s
FEF 25-75 PRE: 1.52 L/s
FEF2575-%Change-Post: 16 %
FEF2575-%Pred-Post: 62 %
FEF2575-%Pred-Pre: 53 %
FEV1-%CHANGE-POST: 5 %
FEV1-%PRED-POST: 77 %
FEV1-%Pred-Pre: 73 %
FEV1-POST: 2.72 L
FEV1-PRE: 2.59 L
FEV1FVC-%Change-Post: 2 %
FEV1FVC-%PRED-PRE: 89 %
FEV6-%CHANGE-POST: 2 %
FEV6-%PRED-POST: 87 %
FEV6-%Pred-Pre: 85 %
FEV6-POST: 3.92 L
FEV6-Pre: 3.83 L
FEV6FVC-%CHANGE-POST: 0 %
FEV6FVC-%PRED-POST: 104 %
FEV6FVC-%PRED-PRE: 104 %
FVC-%CHANGE-POST: 2 %
FVC-%Pred-Post: 84 %
FVC-%Pred-Pre: 82 %
FVC-Post: 3.95 L
FVC-Pre: 3.86 L
PRE FEV6/FVC RATIO: 99 %
Post FEV1/FVC ratio: 69 %
Post FEV6/FVC ratio: 99 %
Pre FEV1/FVC ratio: 67 %
RV % PRED: 135 %
RV: 3.11 L
TLC % PRED: 100 %
TLC: 7.06 L

## 2016-12-08 MED ORDER — ALBUTEROL SULFATE (2.5 MG/3ML) 0.083% IN NEBU
2.5000 mg | INHALATION_SOLUTION | Freq: Once | RESPIRATORY_TRACT | Status: AC
Start: 2016-12-08 — End: 2016-12-08
  Administered 2016-12-08: 2.5 mg via RESPIRATORY_TRACT

## 2016-12-09 ENCOUNTER — Ambulatory Visit (HOSPITAL_COMMUNITY)
Admission: RE | Admit: 2016-12-09 | Discharge: 2016-12-09 | Disposition: A | Discharge status: Home / Self Care | Payer: Medicare HMO | Source: Ambulatory Visit | Attending: Internal Medicine | Admitting: Internal Medicine

## 2016-12-09 DIAGNOSIS — I700 Atherosclerosis of aorta: Secondary | ICD-10-CM | POA: Diagnosis not present

## 2016-12-09 DIAGNOSIS — Z0000 Encounter for general adult medical examination without abnormal findings: Secondary | ICD-10-CM | POA: Diagnosis not present

## 2016-12-09 DIAGNOSIS — J42 Unspecified chronic bronchitis: Secondary | ICD-10-CM | POA: Diagnosis not present

## 2016-12-09 DIAGNOSIS — F17219 Nicotine dependence, cigarettes, with unspecified nicotine-induced disorders: Secondary | ICD-10-CM | POA: Diagnosis not present

## 2016-12-09 DIAGNOSIS — Z87891 Personal history of nicotine dependence: Secondary | ICD-10-CM | POA: Diagnosis not present

## 2016-12-09 DIAGNOSIS — I2510 Atherosclerotic heart disease of native coronary artery without angina pectoris: Secondary | ICD-10-CM | POA: Diagnosis not present

## 2016-12-09 DIAGNOSIS — J439 Emphysema, unspecified: Secondary | ICD-10-CM | POA: Diagnosis not present

## 2016-12-09 DIAGNOSIS — Z122 Encounter for screening for malignant neoplasm of respiratory organs: Secondary | ICD-10-CM | POA: Diagnosis not present

## 2016-12-10 ENCOUNTER — Telehealth: Payer: Self-pay | Admitting: Internal Medicine

## 2016-12-10 DIAGNOSIS — Z0000 Encounter for general adult medical examination without abnormal findings: Secondary | ICD-10-CM

## 2016-12-10 NOTE — Telephone Encounter (Signed)
Called patient to discuss results of CT screening. CT with no evidence of malignancy but showed evidence of coronary atherosclerosis and COPD. Patient will need repeat CT screening in 12 months per radiology recommendations. Encouraged patient to quit smoking. Patient expressed understanding and is in agreement with plan.

## 2016-12-10 NOTE — Assessment & Plan Note (Signed)
-   CT chest screening showed small pulmonary nodules largest approx 3 mm - no evidence for malignancy at this time. Radiology recommending repeat CT in 12 months. Discussed with patient via phone

## 2016-12-15 ENCOUNTER — Ambulatory Visit (INDEPENDENT_AMBULATORY_CARE_PROVIDER_SITE_OTHER): Payer: Medicare HMO | Admitting: Cardiovascular Disease

## 2016-12-15 ENCOUNTER — Encounter: Payer: Self-pay | Admitting: Cardiovascular Disease

## 2016-12-15 VITALS — BP 138/78 | HR 68 | Ht 70.0 in | Wt 197.5 lb

## 2016-12-15 DIAGNOSIS — I2510 Atherosclerotic heart disease of native coronary artery without angina pectoris: Secondary | ICD-10-CM | POA: Diagnosis not present

## 2016-12-15 DIAGNOSIS — I10 Essential (primary) hypertension: Secondary | ICD-10-CM

## 2016-12-15 DIAGNOSIS — I2583 Coronary atherosclerosis due to lipid rich plaque: Secondary | ICD-10-CM

## 2016-12-15 NOTE — Patient Instructions (Signed)

## 2016-12-23 ENCOUNTER — Other Ambulatory Visit: Payer: Self-pay | Admitting: Internal Medicine

## 2016-12-23 DIAGNOSIS — Z79891 Long term (current) use of opiate analgesic: Secondary | ICD-10-CM

## 2016-12-24 MED ORDER — HYDROCODONE-ACETAMINOPHEN 7.5-325 MG PO TABS
1.0000 | ORAL_TABLET | Freq: Four times a day (QID) | ORAL | 0 refills | Status: DC | PRN
Start: 2016-12-24 — End: 2017-01-26

## 2016-12-24 NOTE — Telephone Encounter (Signed)
informed

## 2016-12-26 ENCOUNTER — Other Ambulatory Visit: Payer: Self-pay | Admitting: Internal Medicine

## 2016-12-26 DIAGNOSIS — E118 Type 2 diabetes mellitus with unspecified complications: Secondary | ICD-10-CM

## 2017-01-08 ENCOUNTER — Other Ambulatory Visit: Payer: Self-pay | Admitting: Cardiovascular Disease

## 2017-01-19 ENCOUNTER — Other Ambulatory Visit: Payer: Self-pay | Admitting: Internal Medicine

## 2017-01-26 ENCOUNTER — Other Ambulatory Visit: Payer: Self-pay | Admitting: Internal Medicine

## 2017-01-26 DIAGNOSIS — F411 Generalized anxiety disorder: Secondary | ICD-10-CM

## 2017-01-26 DIAGNOSIS — Z79891 Long term (current) use of opiate analgesic: Secondary | ICD-10-CM

## 2017-01-26 MED ORDER — ALPRAZOLAM 0.5 MG PO TABS: 0.5000 mg | ORAL_TABLET | Freq: Every evening | ORAL | 0 refills | Status: DC | PRN

## 2017-01-26 MED ORDER — HYDROCODONE-ACETAMINOPHEN 7.5-325 MG PO TABS: 1.0000 | ORAL_TABLET | Freq: Four times a day (QID) | ORAL | 0 refills | Status: DC | PRN

## 2017-02-04 ENCOUNTER — Other Ambulatory Visit: Payer: Self-pay | Admitting: Internal Medicine

## 2017-02-04 DIAGNOSIS — K219 Gastro-esophageal reflux disease without esophagitis: Secondary | ICD-10-CM

## 2017-02-16 ENCOUNTER — Other Ambulatory Visit: Payer: Self-pay | Admitting: Infectious Diseases

## 2017-02-22 ENCOUNTER — Other Ambulatory Visit: Payer: Self-pay | Admitting: Internal Medicine

## 2017-02-22 DIAGNOSIS — Z79891 Long term (current) use of opiate analgesic: Secondary | ICD-10-CM

## 2017-02-22 DIAGNOSIS — F411 Generalized anxiety disorder: Secondary | ICD-10-CM

## 2017-02-22 DIAGNOSIS — E118 Type 2 diabetes mellitus with unspecified complications: Secondary | ICD-10-CM

## 2017-02-23 ENCOUNTER — Other Ambulatory Visit: Payer: Self-pay | Admitting: *Deleted

## 2017-02-23 DIAGNOSIS — Z79891 Long term (current) use of opiate analgesic: Secondary | ICD-10-CM

## 2017-02-23 DIAGNOSIS — F411 Generalized anxiety disorder: Secondary | ICD-10-CM

## 2017-02-24 MED ORDER — HYDROCODONE-ACETAMINOPHEN 7.5-325 MG PO TABS: 1.0000 | ORAL_TABLET | Freq: Four times a day (QID) | ORAL | 0 refills | Status: DC | PRN

## 2017-02-24 MED ORDER — METFORMIN HCL 1000 MG PO TABS: 1000.0000 mg | ORAL_TABLET | Freq: Two times a day (BID) | ORAL | 0 refills | Status: DC

## 2017-02-24 MED ORDER — ALPRAZOLAM 0.5 MG PO TABS: 0.5000 mg | ORAL_TABLET | Freq: Every evening | ORAL | 0 refills | Status: DC | PRN

## 2017-02-24 NOTE — Telephone Encounter (Signed)
Called xanax in, tried to call pt to pick up norco, no answer

## 2017-02-25 ENCOUNTER — Other Ambulatory Visit: Payer: Self-pay | Admitting: *Deleted

## 2017-02-25 MED ORDER — ATORVASTATIN CALCIUM 40 MG PO TABS: 40.0000 mg | ORAL_TABLET | Freq: Every day | ORAL | 1 refills | Status: DC

## 2017-02-26 ENCOUNTER — Observation Stay (HOSPITAL_COMMUNITY)
Admission: AD | Admit: 2017-02-26 | Discharge: 2017-02-28 | Disposition: A | Discharge status: Home / Self Care | Payer: Medicare HMO | Source: Ambulatory Visit | Attending: Internal Medicine | Admitting: Internal Medicine

## 2017-02-26 ENCOUNTER — Ambulatory Visit (INDEPENDENT_AMBULATORY_CARE_PROVIDER_SITE_OTHER): Payer: Medicare HMO | Admitting: Internal Medicine

## 2017-02-26 ENCOUNTER — Observation Stay (HOSPITAL_COMMUNITY): Payer: Medicare HMO

## 2017-02-26 ENCOUNTER — Encounter (HOSPITAL_COMMUNITY): Payer: Self-pay | Admitting: Internal Medicine

## 2017-02-26 VITALS — BP 133/66 | HR 68 | Temp 98.0°F | Ht 70.0 in | Wt 195.7 lb

## 2017-02-26 DIAGNOSIS — Z7984 Long term (current) use of oral hypoglycemic drugs: Secondary | ICD-10-CM | POA: Insufficient documentation

## 2017-02-26 DIAGNOSIS — I25110 Atherosclerotic heart disease of native coronary artery with unstable angina pectoris: Secondary | ICD-10-CM | POA: Diagnosis not present

## 2017-02-26 DIAGNOSIS — I200 Unstable angina: Principal | ICD-10-CM | POA: Insufficient documentation

## 2017-02-26 DIAGNOSIS — Z79899 Other long term (current) drug therapy: Secondary | ICD-10-CM | POA: Diagnosis not present

## 2017-02-26 DIAGNOSIS — Z955 Presence of coronary angioplasty implant and graft: Secondary | ICD-10-CM | POA: Diagnosis not present

## 2017-02-26 DIAGNOSIS — Z7982 Long term (current) use of aspirin: Secondary | ICD-10-CM | POA: Insufficient documentation

## 2017-02-26 DIAGNOSIS — F17200 Nicotine dependence, unspecified, uncomplicated: Secondary | ICD-10-CM | POA: Diagnosis present

## 2017-02-26 DIAGNOSIS — B20 Human immunodeficiency virus [HIV] disease: Secondary | ICD-10-CM | POA: Insufficient documentation

## 2017-02-26 DIAGNOSIS — R079 Chest pain, unspecified: Secondary | ICD-10-CM | POA: Diagnosis present

## 2017-02-26 DIAGNOSIS — F17210 Nicotine dependence, cigarettes, uncomplicated: Secondary | ICD-10-CM | POA: Insufficient documentation

## 2017-02-26 DIAGNOSIS — I209 Angina pectoris, unspecified: Secondary | ICD-10-CM | POA: Diagnosis present

## 2017-02-26 DIAGNOSIS — I10 Essential (primary) hypertension: Secondary | ICD-10-CM | POA: Diagnosis not present

## 2017-02-26 DIAGNOSIS — E1169 Type 2 diabetes mellitus with other specified complication: Secondary | ICD-10-CM

## 2017-02-26 DIAGNOSIS — J449 Chronic obstructive pulmonary disease, unspecified: Secondary | ICD-10-CM | POA: Insufficient documentation

## 2017-02-26 DIAGNOSIS — Z794 Long term (current) use of insulin: Secondary | ICD-10-CM | POA: Diagnosis not present

## 2017-02-26 DIAGNOSIS — Z7902 Long term (current) use of antithrombotics/antiplatelets: Secondary | ICD-10-CM | POA: Diagnosis not present

## 2017-02-26 DIAGNOSIS — E119 Type 2 diabetes mellitus without complications: Secondary | ICD-10-CM | POA: Insufficient documentation

## 2017-02-26 DIAGNOSIS — K219 Gastro-esophageal reflux disease without esophagitis: Secondary | ICD-10-CM | POA: Diagnosis not present

## 2017-02-26 DIAGNOSIS — R32 Unspecified urinary incontinence: Secondary | ICD-10-CM | POA: Diagnosis not present

## 2017-02-26 DIAGNOSIS — E1142 Type 2 diabetes mellitus with diabetic polyneuropathy: Secondary | ICD-10-CM

## 2017-02-26 DIAGNOSIS — J45909 Unspecified asthma, uncomplicated: Secondary | ICD-10-CM | POA: Diagnosis not present

## 2017-02-26 DIAGNOSIS — M069 Rheumatoid arthritis, unspecified: Secondary | ICD-10-CM | POA: Diagnosis present

## 2017-02-26 LAB — LIPID PANEL
Cholesterol: 178 mg/dL (ref 0–200)
HDL: 40 mg/dL — AB (ref >40)
LDL CALC: UNDETERMINED mg/dL (ref 0–99)
TRIGLYCERIDES: 478 mg/dL — AB (ref <150)
Total CHOL/HDL Ratio: 4.5 RATIO
VLDL: UNDETERMINED mg/dL (ref 0–40)

## 2017-02-26 LAB — CBC WITH DIFFERENTIAL/PLATELET
BASOS ABS: 0.1 10*3/uL (ref 0.0–0.1)
Basophils Relative: 1 %
Eosinophils Absolute: 0.2 10*3/uL (ref 0.0–0.7)
Eosinophils Relative: 3 %
HEMATOCRIT: 44.8 % (ref 39.0–52.0)
Hemoglobin: 15.3 g/dL (ref 13.0–17.0)
LYMPHS ABS: 1.7 10*3/uL (ref 0.7–4.0)
Lymphocytes Relative: 29 %
MCH: 32.2 pg (ref 26.0–34.0)
MCHC: 34.2 g/dL (ref 30.0–36.0)
MCV: 94.3 fL (ref 78.0–100.0)
Monocytes Absolute: 0.4 10*3/uL (ref 0.1–1.0)
Monocytes Relative: 6 %
NEUTROS ABS: 3.6 10*3/uL (ref 1.7–7.7)
Neutrophils Relative %: 61 %
Platelets: 245 10*3/uL (ref 150–400)
RBC: 4.75 MIL/uL (ref 4.22–5.81)
RDW: 13.7 % (ref 11.5–15.5)
WBC: 5.9 10*3/uL (ref 4.0–10.5)

## 2017-02-26 LAB — COMPREHENSIVE METABOLIC PANEL
ALBUMIN: 3.8 g/dL (ref 3.5–5.0)
ALT: 39 U/L (ref 17–63)
ANION GAP: 11 (ref 5–15)
AST: 31 U/L (ref 15–41)
Alkaline Phosphatase: 94 U/L (ref 38–126)
BILIRUBIN TOTAL: 0.7 mg/dL (ref 0.3–1.2)
BUN: 17 mg/dL (ref 6–20)
CHLORIDE: 98 mmol/L — AB (ref 101–111)
CO2: 26 mmol/L (ref 22–32)
Calcium: 9.7 mg/dL (ref 8.9–10.3)
Creatinine, Ser: 0.98 mg/dL (ref 0.61–1.24)
GFR calc Af Amer: >60 mL/min (ref >60)
GFR calc non Af Amer: >60 mL/min (ref >60)
GLUCOSE: 396 mg/dL — AB (ref 65–99)
POTASSIUM: 3.8 mmol/L (ref 3.5–5.1)
Sodium: 135 mmol/L (ref 135–145)
TOTAL PROTEIN: 6.5 g/dL (ref 6.5–8.1)

## 2017-02-26 LAB — TROPONIN I
Troponin I: 0.03 ng/mL (ref <0.03)
Troponin I: 0.04 ng/mL (ref <0.03)

## 2017-02-26 LAB — GLUCOSE, CAPILLARY: GLUCOSE-CAPILLARY: 470 mg/dL — AB (ref 65–99)

## 2017-02-26 MED ORDER — LISINOPRIL 20 MG PO TABS
20.0000 mg | ORAL_TABLET | Freq: Every day | ORAL | Status: DC
  Administered 2017-02-27 – 2017-02-28 (×2): 20 mg via ORAL
  Filled 2017-02-26 (×2): qty 1

## 2017-02-26 MED ORDER — TERAZOSIN HCL 2 MG PO CAPS
2.0000 mg | ORAL_CAPSULE | Freq: Every day | ORAL | Status: DC
  Administered 2017-02-26 – 2017-02-27 (×2): 2 mg via ORAL
  Filled 2017-02-26 (×3): qty 1

## 2017-02-26 MED ORDER — ATORVASTATIN CALCIUM 40 MG PO TABS
40.0000 mg | ORAL_TABLET | Freq: Every day | ORAL | Status: DC
  Administered 2017-02-27 – 2017-02-28 (×2): 40 mg via ORAL
  Filled 2017-02-26 (×2): qty 1

## 2017-02-26 MED ORDER — ALPRAZOLAM 0.5 MG PO TABS: 0.5000 mg | ORAL_TABLET | Freq: Every evening | ORAL | Status: DC | PRN

## 2017-02-26 MED ORDER — HYDROCHLOROTHIAZIDE 25 MG PO TABS
25.0000 mg | ORAL_TABLET | Freq: Every day | ORAL | Status: DC
  Administered 2017-02-27 – 2017-02-28 (×2): 25 mg via ORAL
  Filled 2017-02-26 (×2): qty 1

## 2017-02-26 MED ORDER — HYDROCODONE-ACETAMINOPHEN 7.5-325 MG PO TABS: 1.0000 | ORAL_TABLET | Freq: Four times a day (QID) | ORAL | Status: DC | PRN

## 2017-02-26 MED ORDER — EFAVIRENZ-EMTRICITAB-TENOFOVIR 600-200-300 MG PO TABS
1.0000 | ORAL_TABLET | Freq: Every day | ORAL | Status: DC
Start: 2017-02-26 — End: 2017-02-28
  Administered 2017-02-26 – 2017-02-27 (×2): 1 via ORAL
  Filled 2017-02-26 (×3): qty 1

## 2017-02-26 MED ORDER — INSULIN GLARGINE 100 UNIT/ML ~~LOC~~ SOLN
10.0000 [IU] | Freq: Every day | SUBCUTANEOUS | Status: DC
  Administered 2017-02-26 – 2017-02-27 (×2): 10 [IU] via SUBCUTANEOUS
  Filled 2017-02-26 (×2): qty 0.1

## 2017-02-26 MED ORDER — BUPROPION HCL ER (SR) 150 MG PO TB12: 150.0000 mg | ORAL_TABLET | Freq: Two times a day (BID) | ORAL | Status: DC

## 2017-02-26 MED ORDER — CARVEDILOL 3.125 MG PO TABS
3.1250 mg | ORAL_TABLET | Freq: Two times a day (BID) | ORAL | Status: DC
  Administered 2017-02-27: 3.125 mg via ORAL
  Filled 2017-02-26: qty 1

## 2017-02-26 MED ORDER — DULOXETINE HCL 60 MG PO CPEP
60.0000 mg | ORAL_CAPSULE | Freq: Every day | ORAL | Status: DC
  Administered 2017-02-27 – 2017-02-28 (×2): 60 mg via ORAL
  Filled 2017-02-26 (×2): qty 1

## 2017-02-26 MED ORDER — ASPIRIN EC 81 MG PO TBEC
81.0000 mg | DELAYED_RELEASE_TABLET | Freq: Every day | ORAL | Status: DC
  Administered 2017-02-27 – 2017-02-28 (×2): 81 mg via ORAL
  Filled 2017-02-26 (×2): qty 1

## 2017-02-26 MED ORDER — HEPARIN SODIUM (PORCINE) 5000 UNIT/ML IJ SOLN
5000.0000 [IU] | Freq: Three times a day (TID) | INTRAMUSCULAR | Status: DC
  Administered 2017-02-26 – 2017-02-27 (×3): 5000 [IU] via SUBCUTANEOUS
  Filled 2017-02-26 (×4): qty 1

## 2017-02-26 MED ORDER — SODIUM CHLORIDE 0.9% FLUSH
3.0000 mL | Freq: Two times a day (BID) | INTRAVENOUS | Status: DC
  Administered 2017-02-26 – 2017-02-28 (×4): 3 mL via INTRAVENOUS

## 2017-02-26 MED ORDER — CLOPIDOGREL BISULFATE 75 MG PO TABS
75.0000 mg | ORAL_TABLET | Freq: Every day | ORAL | Status: DC
  Administered 2017-02-27 – 2017-02-28 (×2): 75 mg via ORAL
  Filled 2017-02-26 (×2): qty 1

## 2017-02-26 MED ORDER — INSULIN ASPART 100 UNIT/ML ~~LOC~~ SOLN
0.0000 [IU] | Freq: Every day | SUBCUTANEOUS | Status: DC
  Administered 2017-02-27: 3 [IU] via SUBCUTANEOUS

## 2017-02-26 MED ORDER — INSULIN ASPART 100 UNIT/ML ~~LOC~~ SOLN
0.0000 [IU] | Freq: Three times a day (TID) | SUBCUTANEOUS | Status: DC
  Administered 2017-02-27: 7 [IU] via SUBCUTANEOUS
  Administered 2017-02-27: 5 [IU] via SUBCUTANEOUS
  Administered 2017-02-27: 3 [IU] via SUBCUTANEOUS
  Administered 2017-02-28: 5 [IU] via SUBCUTANEOUS
  Administered 2017-02-28: 7 [IU] via SUBCUTANEOUS

## 2017-02-26 MED ORDER — INSULIN ASPART 100 UNIT/ML ~~LOC~~ SOLN
10.0000 [IU] | Freq: Once | SUBCUTANEOUS | Status: AC
  Administered 2017-02-26: 10 [IU] via SUBCUTANEOUS

## 2017-02-26 NOTE — Assessment & Plan Note (Deleted)
Currently smoking.  Current medications: Aspirin 81 mg daily, Plavix 75 mg daily, atorvastatin 40 mg daily

## 2017-02-26 NOTE — Progress Notes (Signed)
CC: "I passed out and lost my urine on Sunday."  HPI:  [name] is a 61 y.o. with history of HTN, CAD (DES LAD 06/2014), and GERD who presents with worsening chest discomfort for the last several months.  Last Sunday he noticed some substernal chest tightness while sitting watching TV at 9pm.  He went to bed around 11pm, remembers sitting down on the edge of the bed and must fallen asleep. He woke up at around 1 pm to urinate and again, was still having chest discomfort, and went back to bed and fell asleep again. He is next woken up by just onset around 4 AM and had been incontinent of urine, which has never happened to him before.  He is currently asymptomatic.  For the past several months he has had worsening fatigue and intermittent substernal chest tightness/pressure.  At first this was occurring just with exertion, like when he goes upstairs while carrying things at work.  It has been happening more and more easily recently, and has started happening at unpredictable times like just while sitting down.  He has continued to have this kind of chest discomfort this past week.  It also sometimes feels like a "burp that's not coming up".  He has not noticed associated shortness of breath, diaphoresis, nausea, or radiation.  He has had more stress at work recently and thinks it could be due to anxiety. He also wonders if his symptoms could be due to acid reflux; he used to be on omeprazole which worked very well for him, but was switched to Zantac several months ago with concern for drug interaction with his Plavix.  He also notes that he was bit on the leg about one month ago by a neighbor's dog, which is up-to-date on its vaccinations. He has not had any fevers, chills, or drainage from the wound, and it seems to be healing well.  Past Medical History:  Diagnosis Date  . Anxiety   . Arthritis    "pretty much all over"  . Asthma    "mild" (07/17/2014)  . Atypical angina ([place])    Myoview 2003, Normal EF 64%  . CAD (coronary artery disease)    a. 10/20 DES to 80% mid LAD  . COPD (chronic obstructive pulmonary disease) ([place])    "mild" (07/17/2014)  . Depression   . Foot pain 03/30/2011  . GERD (gastroesophageal reflux disease)   . H/O hiatal hernia   . Headache    "weekly" (06/2014)  . History of gout   . HIV (human immunodeficiency virus infection) ([place]) 1995   DX after shingles followed by Dr. Ola Spurr (ID)  . Hx of cardiovascular stress test    ETT-Myoview (3/16):  Ex 10:15, No Ischemia, EF 57%; Normal Study  . Hyperlipidemia   . Hypertension    Well Controlled off meds  . Kidney stones    "passed them all"  . Pneumonia 4-5 times  . Rheumatoid arthritis(714.0)    Followed by Dr. Charlestine Night, off MTX and prdnisone since 2007  . Sleep apnea    does not wear mask (07/17/2014)  . Type II diabetes mellitus ([place])    Review of Systems:   Review of Systems  Constitutional: Negative for chills and fever.  Respiratory: Positive for shortness of breath. Negative for cough.   Cardiovascular: Positive for chest pain. Negative for palpitations, orthopnea, leg swelling and PND.  Gastrointestinal: Negative for nausea and vomiting.  Neurological: Negative for dizziness.   Physical Exam:  Vitals:   02/26/17 1400  BP: 133/66  Pulse: 68  Temp: 98 F (36.7 C)  TempSrc: Oral  SpO2: 99%  Weight: 195 lb 11.2 oz (88.8 kg)  Height: 5\' 10"  (1.778 m)   Physical Exam  Constitutional: He is oriented to person, place, and time. He appears well-developed and well-nourished. No distress.  Cardiovascular: Normal rate and regular rhythm.   Normal I1C3 2/6 systolic murmur loudest RUSB  Pulmonary/Chest: Effort normal and breath sounds normal.  Abdominal: Soft. He exhibits no distension. There is no tenderness.  Musculoskeletal: He exhibits no edema or tenderness.  Neurological: He is alert and oriented to person, place, and time.  Skin: Skin is warm and dry.  2 well  healing puncture wounds on lateral left calf.  No erythema, induration, drainage.  Psychiatric: He has a normal mood and affect. His behavior is normal.   EKG NSR, anterior TWI, diffuse flattening of T waves, inferior Q waves. Compared to prior from 12/15/2016, the anterior inverted TW are deeper, similar to appearance from 07/2015.  Cardiac Panel (last 3 results)  Recent Labs  02/26/17 1459  TROPONINI 0.03*     Assessment & Plan:   See Encounters Tab for problem based charting.  Patient discussed with Dr. Lynnae January

## 2017-02-26 NOTE — Progress Notes (Signed)
Bed placement called for direct admission. Bed available on 3W -  pt called and informed to go to Admissions ([place]); voiced understanding.

## 2017-02-26 NOTE — H&P (Signed)
Date: 02/26/2017               Patient Name:  Riley Jones MRN: [id number]  DOB: [birth year] Age / Sex: 61 y.o., male   PCP: Aldine Contes, MD         Medical Service: Internal Medicine Teaching Service         Attending Physician: Dr. Gilles Chiquito    First Contact: Dr. Ophelia Shoulder Pager: [phone]  Second Contact: Dr. Zada Finders Pager: [phone]       After Hours (After 5p/  First Contact Pager: [phone]  weekends / holidays): Second Contact Pager: [phone]   Chief Complaint: Chest pain and urinary incontinence  History of Present Illness: Riley Jones is a 61 y.o. gentleman with PMH CAD (s/p DES to LAD in 06/2014, normal myoview 11/2014), HIV, rheumatoid arthritis, T2DM, HTN, HLD, tobacco use, GERD, chronic pain on opioids, and depression who admitted from IM clinic for increasingly frequent episodes of chest pain and an episode of urinary incontinence last weekend. Last Sunday he noticed some central chest tightness that began while watching TV. He dozed off and woke and his chest discomfort was still present. He has urinary frequency at baseline and was waking up often to urinate, but woke up this time and found that he had been incontinent of a large volume of urine while asleep, which he found very concerning. He reports no nocturia, obstruction, dysuria, or medication changes recently.  Today he had no chest pain in clinic and has been feeling his usual state of health, although he reports "standing up too quick" and passing out at work yesterday. Of note he has had several months of worsening fatigue and substernal chest tightness, initially with exertion (stairs, lifting) but now occurring at rest and more frequently (2-3 per week). These episodes are typically relieved with rest but not always, and coincide with dizziness and diaphoresis, but no dyspnea or radiation of pain. He describes the chest discomfort as "like a burp that is not coming out."  In clinic he was  afebrile and hemodynamically stable. Stat troponin was 0.03 and EKG showed worsened lateral TWI compared to prior. Arrangements were made for admission to IMTS.  Meds:  Current Meds  Medication Sig  . albuterol (VENTOLIN HFA) 108 (90 BASE) MCG/ACT inhaler Inhale 1 puff into the lungs every 4 (four) hours as needed. for shortness of breath. Or may use every 6 hours as needed.  . ALPRAZolam (XANAX) 0.5 MG tablet Take 1 tablet (0.5 mg total) by mouth at bedtime as needed for anxiety.  Marland Kitchen aspirin 81 MG EC tablet Take 81 mg by mouth daily.    Marland Kitchen atorvastatin (LIPITOR) 40 MG tablet Take 1 tablet (40 mg total) by mouth daily.  . ATRIPLA 600-200-300 MG tablet TAKE 1 TABLET BY MOUTH DAILY  . buPROPion (ZYBAN) 150 MG 12 hr tablet Take 1 tablet (150 mg total) by mouth 2 (two) times daily.  . cetirizine (ZYRTEC) 10 MG tablet Take 10 mg by mouth daily.  . clopidogrel (PLAVIX) 75 MG tablet TAKE ONE TABLET BY MOUTH ONCE DAILY.  . DULoxetine (CYMBALTA) 60 MG capsule Take 1 capsule (60 mg total) by mouth daily.  . folic acid (FOLVITE) 1 MG tablet Take 1 tablet (1 mg total) by mouth daily.  . hydrochlorothiazide (HYDRODIURIL) 25 MG tablet TAKE ONE TABLET BY MOUTH ONCE DAILY  . HYDROcodone-acetaminophen (NORCO) 7.5-325 MG tablet Take 1 tablet by mouth every 6 (six) hours as needed for  moderate pain.  Marland Kitchen lisinopril (PRINIVIL,ZESTRIL) 20 MG tablet Take 1 tablet (20 mg total) by mouth daily.  Marland Kitchen loratadine (CLARITIN) 10 MG tablet Take 1 tablet (10 mg total) by mouth daily.  . metFORMIN (GLUCOPHAGE) 1000 MG tablet Take 1 tablet (1,000 mg total) by mouth 2 (two) times daily with a meal.  . . NITROSTAT 0.4 MG SL tablet PLACE 1 TABLET UNDER THE TONGUE EVERY 5 MINUTES AS NEEDED FOR CHEST PAIN  . NOVOLIN N RELION 100 UNIT/ML injection INJECT 17 UNITS INTO THE SKIN IN THE MORNING AND 20 UNITS IN THE EVENING BEFORE MEALS.  Marland Kitchen terazosin (HYTRIN) 2 MG capsule Take 1 capsule (2 mg total) by mouth at bedtime.   Leflunomide for  RA  Allergies: Allergies as of 02/26/2017 - Review Complete 02/26/2017  Allergen Reaction Noted  . Penicillins Hives, Nausea Only, and Rash 05/22/2013  . Zoloft [sertraline hcl] Other (See Comments) 01/28/2012  . Chantix [varenicline] Hives 09/08/2013   Past Medical History:  Diagnosis Date  . Anxiety   . Arthritis    "pretty much all over"  . Asthma    "mild" (07/17/2014)  . Atypical angina ([place])    Myoview 2003, Normal EF 64%  . CAD (coronary artery disease)    a. 10/20 DES to 80% mid LAD  . COPD (chronic obstructive pulmonary disease) ([place])    "mild" (07/17/2014)  . Depression   . Foot pain 03/30/2011  . GERD (gastroesophageal reflux disease)   . H/O hiatal hernia   . Headache    "weekly" (06/2014)  . History of gout   . HIV (human immunodeficiency virus infection) ([place]) 1995   DX after shingles followed by Dr. Ola Spurr ([facility])  . Hx of cardiovascular stress test    ETT-Myoview (3/16):  Ex 10:15, No Ischemia, EF 57%; Normal Study  . Hyperlipidemia   . Hypertension    Well Controlled off meds  . Kidney stones    "passed them all"  . Pneumonia 4-5 times  . Rheumatoid arthritis(714.0)    Followed by Dr. Charlestine Night, off MTX and prdnisone since 2007  . Sleep apnea    does not wear mask (07/17/2014)  . Type II diabetes mellitus (HCC)     Family History:  Family History  Problem Relation Age of Onset  . Osteoporosis Mother   . Heart disease Father   . Heart attack Father   . Heart attack Brother   . Stroke Maternal Grandmother   . Stroke Maternal Grandfather   . Stroke Paternal Grandmother   . Stroke Paternal Grandfather    Social History:  Social History   Social History  . Marital status: Single    Spouse name: N/A  . Number of children: N/A  . Years of education: 109   Occupational History  . [employer]   . Truck driver (retired)    Social History Main Topics  . Smoking status: Current Every Day Smoker    Packs/day: 1.00    Years: 46.00    Types: Cigarettes   . Smokeless tobacco: Never Used  . Alcohol use 0.6 oz/week    1 Glasses of wine per week     Comment: 07/17/2014 "glass of wine once or twice/month"  . Drug use: No  . Sexual activity: Not Currently    Partners: Male     Comment: given condoms   Other Topics Concern  . Not on file   Social History Narrative   [employer] approved beginning 12/11/2009 - 12/27/2010   Sadie Haber  benefits approved; patient eligible for 100% discount for out patient labs and office visits. Patient eligible for 70% discount for other services per Irish Elders 09/08/2010      Patient is on disability, and walks 2-3 times per week.     Review of Systems: A complete ROS was negative except as per HPI.   Physical Exam: Blood pressure 135/70, pulse 86, temperature 98.1 F (36.7 C), temperature source Oral, resp. rate 18, height 5\' 10"  (1.778 m), weight 189 lb 8 oz (86 kg), SpO2 98 %.  General appearance: WDWN man resting comfortably in bed, in no distress, conversational HENT: Normocephalic, atraumatic, moist mucous membranes Eyes: PERRL, non-icteric Cardiovascular: Regular rate and rhythm, no murmurs, rubs, gallops Respiratory: Clear to auscultation bilaterally, normal work of breathing Abdomen: BS+, soft, non-tender, non-distended Extremities: Normal bulk and range of motion, no edema, 2+ peripheral pulses Skin: Warm, dry, intact, healing wounds on left calf, no erythema, warmth, drainage Neuro: Alert and oriented Psych: Appropriate affect, clear speech, thoughts linear and goal-directed  EKG: NSR, RBBB, new TWI V3-V6   Assessment & Plan by Problem: Active Problems:   Chest pain  Unstable angina, increasingly frequent episodes of chest tightness with diaphoresis and dizziness (typical), now occurring even at rest 2-3 times weekly. Previously only on exertion. Very high risk, history of CAD with DES in 2015, HTN, HLD, DM, active tobacco use, RA, HIV. Normal myoview in 2016 but his symptoms have  accelerated. His cardiologist is Dr. Johnsie Cancel. No active chest pain currently, hemodynamically stable. EKG with new more prominent TWI in lateral leads, initial trop 0.03. -- Consult cardiology in AM -- Serial troponins overnight -- TTE -- NPO at MN for possible cath vs NM study tomorrow -- Telemetry -- Check HbA1c, lipid panel, CBC, BMP -- home Aspirin and Plavix daily  Urinary incontinence, one episode that occurred during sleep 4 days ago, had baseline frequency which he attributes to "fluid pill" but no recent obstructive symptoms or dysuria. Incidental hyperglycemia to 470 found today, likely experiencing polyuria due to poorly DM control. -- Check UA, A1c -- Optimize glycemic control  T2DM, poorly controll last A1c 9.6 in 11/2016, glucose 470 tonight. On Metformin 1000 BID and Novolin 17 units qAM and 20 units qPM -- Insulin aspart 10 units once now for glucose 470 -- Lantus 10 units QHS -- SSI-S TID AC HS  HTN -- home HCTZ 25 mg and Lisinopril 20 mg daily  HIV, well controlled, undetectable VL and CD4 870 Feb 2018 -- home Atripla QD (Efavirenz-emtricitabine-tenofovir)  Depression/Anxiety -- home Wellbutrin, Cymbalta, and prn Xanax QHS  Rheumatoid arthritis, previously on MTX but could not tolerate, with chronic pain requiring opioids, on Leflunomide per his rheumatologist (Dr. Amil Amen). -- home Norco 7.5-325 Q6h prn for pain -- reconcile Leflunomide dose tomorrow  FEN/GI: NPO at MN, replete electrolytes as needed  DVT ppx: Heparin TID  Code status: Full code  Dispo: Admit patient to Observation with expected length of stay less than 2 midnights.  Signed: Asencion Partridge, MD 02/26/2017, 7:39 PM  Pager: [phone]

## 2017-02-26 NOTE — Patient Instructions (Signed)
I would like to admit you to the hospital overnight because I'm concerned that the pain is having is coming from your heart. Please report to admitting on the second floor.

## 2017-02-26 NOTE — Assessment & Plan Note (Addendum)
Worsening substernal exertional chest pressure which has progressed to intermittent nonexertional chest pressure in man with known coronary artery disease is highly concerning for cardiac pain and unstable angina.  On aspirin, Plavix, and atorvastatin. Follows with cardiologist Dr. Johnsie Cancel, who he last saw in March. He does not recall mentioning those symptoms to Dr. Johnsie Cancel at that time, and thinks that they must have, on since then. -EKG -STAT troponin  EKG with anterior TWI more pronounced than most recent prior, mild troponin elevation, and history concerning for angina.  HEART score 7. -Direct admission to telemetry bed ACS rule out and ischemic workup

## 2017-02-27 ENCOUNTER — Other Ambulatory Visit (HOSPITAL_COMMUNITY): Payer: Medicare HMO

## 2017-02-27 DIAGNOSIS — M069 Rheumatoid arthritis, unspecified: Secondary | ICD-10-CM

## 2017-02-27 DIAGNOSIS — Z888 Allergy status to other drugs, medicaments and biological substances status: Secondary | ICD-10-CM

## 2017-02-27 DIAGNOSIS — Z79891 Long term (current) use of opiate analgesic: Secondary | ICD-10-CM

## 2017-02-27 DIAGNOSIS — Z955 Presence of coronary angioplasty implant and graft: Secondary | ICD-10-CM

## 2017-02-27 DIAGNOSIS — I25110 Atherosclerotic heart disease of native coronary artery with unstable angina pectoris: Secondary | ICD-10-CM

## 2017-02-27 DIAGNOSIS — Z794 Long term (current) use of insulin: Secondary | ICD-10-CM | POA: Diagnosis not present

## 2017-02-27 DIAGNOSIS — Z823 Family history of stroke: Secondary | ICD-10-CM

## 2017-02-27 DIAGNOSIS — B20 Human immunodeficiency virus [HIV] disease: Secondary | ICD-10-CM | POA: Diagnosis not present

## 2017-02-27 DIAGNOSIS — E785 Hyperlipidemia, unspecified: Secondary | ICD-10-CM | POA: Diagnosis not present

## 2017-02-27 DIAGNOSIS — I25118 Atherosclerotic heart disease of native coronary artery with other forms of angina pectoris: Secondary | ICD-10-CM | POA: Diagnosis not present

## 2017-02-27 DIAGNOSIS — Z7902 Long term (current) use of antithrombotics/antiplatelets: Secondary | ICD-10-CM | POA: Diagnosis not present

## 2017-02-27 DIAGNOSIS — Z79899 Other long term (current) drug therapy: Secondary | ICD-10-CM

## 2017-02-27 DIAGNOSIS — Z7982 Long term (current) use of aspirin: Secondary | ICD-10-CM

## 2017-02-27 DIAGNOSIS — F17219 Nicotine dependence, cigarettes, with unspecified nicotine-induced disorders: Secondary | ICD-10-CM | POA: Diagnosis not present

## 2017-02-27 DIAGNOSIS — I10 Essential (primary) hypertension: Secondary | ICD-10-CM | POA: Diagnosis not present

## 2017-02-27 DIAGNOSIS — I2510 Atherosclerotic heart disease of native coronary artery without angina pectoris: Secondary | ICD-10-CM | POA: Diagnosis not present

## 2017-02-27 DIAGNOSIS — J449 Chronic obstructive pulmonary disease, unspecified: Secondary | ICD-10-CM | POA: Diagnosis not present

## 2017-02-27 DIAGNOSIS — E1165 Type 2 diabetes mellitus with hyperglycemia: Secondary | ICD-10-CM | POA: Diagnosis not present

## 2017-02-27 DIAGNOSIS — J45909 Unspecified asthma, uncomplicated: Secondary | ICD-10-CM | POA: Diagnosis not present

## 2017-02-27 DIAGNOSIS — R079 Chest pain, unspecified: Secondary | ICD-10-CM | POA: Diagnosis not present

## 2017-02-27 DIAGNOSIS — Z880 Allergy status to penicillin: Secondary | ICD-10-CM

## 2017-02-27 DIAGNOSIS — F418 Other specified anxiety disorders: Secondary | ICD-10-CM | POA: Diagnosis not present

## 2017-02-27 DIAGNOSIS — Z21 Asymptomatic human immunodeficiency virus [HIV] infection status: Secondary | ICD-10-CM | POA: Diagnosis not present

## 2017-02-27 DIAGNOSIS — G8929 Other chronic pain: Secondary | ICD-10-CM

## 2017-02-27 DIAGNOSIS — Z8249 Family history of ischemic heart disease and other diseases of the circulatory system: Secondary | ICD-10-CM

## 2017-02-27 DIAGNOSIS — R32 Unspecified urinary incontinence: Secondary | ICD-10-CM

## 2017-02-27 DIAGNOSIS — E782 Mixed hyperlipidemia: Secondary | ICD-10-CM | POA: Diagnosis not present

## 2017-02-27 DIAGNOSIS — E119 Type 2 diabetes mellitus without complications: Secondary | ICD-10-CM | POA: Diagnosis not present

## 2017-02-27 DIAGNOSIS — I200 Unstable angina: Secondary | ICD-10-CM | POA: Diagnosis not present

## 2017-02-27 DIAGNOSIS — Z8262 Family history of osteoporosis: Secondary | ICD-10-CM

## 2017-02-27 DIAGNOSIS — F17210 Nicotine dependence, cigarettes, uncomplicated: Secondary | ICD-10-CM

## 2017-02-27 LAB — HEMOGLOBIN A1C
Hgb A1c MFr Bld: 10 % — ABNORMAL HIGH (ref 4.8–5.6)
MEAN PLASMA GLUCOSE: 240 mg/dL

## 2017-02-27 LAB — TROPONIN I: Troponin I: 0.03 ng/mL (ref <0.03)

## 2017-02-27 LAB — GLUCOSE, CAPILLARY
GLUCOSE-CAPILLARY: 326 mg/dL — AB (ref 65–99)
Glucose-Capillary: 329 mg/dL — ABNORMAL HIGH (ref 65–99)
Glucose-Capillary: 211 mg/dL — ABNORMAL HIGH (ref 65–99)
Glucose-Capillary: 264 mg/dL — ABNORMAL HIGH (ref 65–99)

## 2017-02-27 MED ORDER — CARVEDILOL 3.125 MG PO TABS
3.1250 mg | ORAL_TABLET | Freq: Two times a day (BID) | ORAL | Status: DC
  Administered 2017-02-28: 3.125 mg via ORAL
  Filled 2017-02-27: qty 1

## 2017-02-27 NOTE — Progress Notes (Signed)
   Subjective: No acute events overnight. No active chest pain or shortness of breath. Only complaint is being hungry and having a headache.  Objective:  Vital signs in last 24 hours: Vitals:   02/26/17 1809 02/26/17 2219 02/27/17 0500 02/27/17 0811  BP: 135/70 131/75 (!) 103/59 110/71  Pulse: 86  64 64  Resp: 18  18   Temp: 98.1 F (36.7 C)  98 F (36.7 C)   TempSrc: Oral  Oral   SpO2: 98%  100%   Weight: 86 kg (189 lb 8 oz)  85.7 kg (189 lb)   Height: 5\' 10"  (1.778 m)      Physical Exam  Constitutional: He is oriented to person, place, and time. He appears well-developed and well-nourished.  HENT:  Head: Normocephalic and atraumatic.  Cardiovascular: Normal rate and regular rhythm.   Respiratory: Effort normal and breath sounds normal.  GI: Soft. Bowel sounds are normal.  Musculoskeletal: Normal range of motion.  Neurological: He is alert and oriented to person, place, and time.     Assessment/Plan:  Active Problems:   Human immunodeficiency virus (HIV) disease (HCC)   Diabetes mellitus ([place])   Nicotine dependence   Essential hypertension   Rheumatoid arthritis ([place])   Unstable angina (HCC)   Chest pain  # Unstable angina  Increasingly frequent episodes of chest tightness with diaphoresis and dizziness (typical), now occurring even at rest 2-3 times weekly. Previously only on exertion. Very high risk, history of CAD with DES in 2015, HTN, HLD, DM, active tobacco use, RA, HIV. Normal myoview in 2016 but his symptoms have accelerated.No active chest pain currently, hemodynamically stable. EKG with new more prominent TWI in lateral leads, initial trop 0.03. -- On cardiology auto consult floor, we appreciate their recommendations on this case -- TTE -- NPO at MN for possible cath vs NM study tomorrow -- Telemetry -- Check HbA1c, lipid panel, CBC, BMP -- home Aspirin and Plavix daily  # Urinary incontinence No additional episodes of urinary incontinence. History not  suggestive of BPH or other obstructive process. I think this is most likely secondary to uncontrolled hyperglycemia. We will continue to monitor and record strict I's and O's. May consider doing post void residual to evaluate bladder volume. -- Check UA -- Optimize glycemic control -- I's and O's   # T2DM  Poorly controll last A1c 9.6 in 11/2016, glucose 470 tonight. On Metformin 1000 BID and Novolin 17 units qAM and 20 units qPM -- Insulin aspart 10 units once now for glucose 470 -- Lantus 10 units QHS -- SSI-S TID AC HS  # HTN  -- home HCTZ 25 mg and Lisinopril 20 mg daily  # HIV  well controlled, undetectable VL and CD4 870 Feb 2018 -- home Atripla QD (Efavirenz-emtricitabine-tenofovir)  # Depression/Anxiety  -- home Wellbutrin, Cymbalta, and prn Xanax QHS  # Rheumatoid arthritis  Previously on MTX but could not tolerate, with chronic pain requiring opioids, on Leflunomide per his rheumatologist (Dr. Amil Amen). -- home Norco 7.5-325 Q6h prn for pain -- reconcile Leflunomide dose   FEN/GI: NPO at MN, replete electrolytes as needed DVT ppx: Heparin TID Code status: Full code  Dispo: Anticipated discharge in approximately 1-2 day(s).   Ophelia Shoulder, MD 02/27/2017, 12:31 PM Pager: [phone]

## 2017-02-27 NOTE — Consult Note (Signed)
Cardiology Consultation:   Patient ID: Riley Jones; [id number]; 03/10/1956   Admit date: 02/26/2017 Date of Consult: 02/27/2017  Primary Care Provider: Aldine Contes, MD Primary Cardiologist: Johnsie Cancel   Patient Profile:   Riley Jones is a 61 y.o. male with a hx of CAD who is being seen today for the evaluation of chest pain at the request of Dr. Gilles Chiquito .  History of Present Illness:   Riley Jones is a 61 year old male with a past medical history significant for coronary artery disease with LAD stent in 2015, type 2 diabetes, HIV, rheumatoid arthritis, hyperlipidemia, hypertension, and chronic pain.  He has been experiencing retrosternal chest pressure radiating to the left precordium which typically occurred while he was working when he was exerting himself. He had an episode yesterday which was accompanied by urinary incontinence and this scared him. He then presented to the hospital. He has associated nausea and lightheadedness but denies syncope. He denies palpitations. He had a normal stress test in March 2016.  Troponins were 0.03, 0.04, and 0.03.  ECG which I personally interpreted demonstrated sinus rhythm with right bundle branch block, nonspecific inferior T-wave abnormalities, and T-wave inversions in leads V3 through V6.  ECG on 08/20/15 demonstrated nonspecific T-wave abnormalities in leads V5 and V6.  Chest x-ray showed mild cardiomegaly without active cardiopulmonary disease.  Chest CT 12/09/16 demonstrated aortic atherosclerosis and atherosclerosis of the left main, LAD, circumflex, and right coronary arteries. There were also calcifications of the aortic and mitral annulus.  An echocardiogram has been ordered and is pending.      Past Medical History:  Diagnosis Date  . Anxiety   . Arthritis    "pretty much all over"  . Asthma    "mild" (07/17/2014)  . Atypical angina ([place])    Myoview 2003, Normal EF 64%  . CAD (coronary artery disease)    a. 10/20 DES to 80% mid LAD  . COPD (chronic obstructive pulmonary disease) ([place])    "mild" (07/17/2014)  . Depression   . Foot pain 03/30/2011  . GERD (gastroesophageal reflux disease)   . H/O hiatal hernia   . Headache    "weekly" (06/2014)  . History of gout   . HIV (human immunodeficiency virus infection) ([place]) 1995   DX after shingles followed by Dr. Ola Spurr ([facility])  . Hx of cardiovascular stress test    ETT-Myoview (3/16):  Ex 10:15, No Ischemia, EF 57%; Normal Study  . Hyperlipidemia   . Hypertension    Well Controlled off meds  . Kidney stones    "passed them all"  . Pneumonia 4-5 times  . Rheumatoid arthritis(714.0)    Followed by Dr. Charlestine Night, off MTX and prdnisone since 2007  . Sleep apnea    does not wear mask (07/17/2014)  . Type II diabetes mellitus ([place])     Past Surgical History:  Procedure Laterality Date  . APPENDECTOMY  ~ 1981  . CARPAL TUNNEL RELEASE Left 1990's  . CORONARY ANGIOPLASTY WITH STENT PLACEMENT  07/17/2014   a. DES to 80% mid LAD  . HERNIA REPAIR    . LAPAROSCOPIC INCISIONAL / UMBILICAL / VENTRAL HERNIA REPAIR  1990's   "it was a double; not inguinal"  . LEFT HEART CATHETERIZATION WITH CORONARY ANGIOGRAM N/A 07/17/2014   Procedure: LEFT HEART CATHETERIZATION WITH CORONARY ANGIOGRAM;  Surgeon: [name], MD;  [facility] C[facility] LAB;  Service: Cardiovascular;  Laterality: N/A;  . PERCUTANEOUS STENT INTERVENTION  07/17/2014   Procedure: PERCUTANEOUS  STENT INTERVENTION;  Surgeon: [name], MD;  Location: [facility] CATH LAB;  Service: Cardiovascular;;  . TONSILLECTOMY AND ADENOIDECTOMY  1990's  . TRIGGER FINGER RELEASE Left 1990's   2nd & 5th digits  . UMBILICAL HERNIA REPAIR  1980's   w/mesh  . UVULOPALATOPHARYNGOPLASTY (UPPP)/TONSILLECTOMY/SEPTOPLASTY  1990's     Inpatient Medications: Scheduled Meds: . aspirin EC  81 mg Oral Daily  . atorvastatin  40 mg Oral q1800  . carvedilol  3.125 mg Oral BID WC  . clopidogrel  75 mg Oral Daily    . DULoxetine  60 mg Oral Daily  . efavirenz-emtricitabine-tenofovir  1 tablet Oral Daily  . heparin  5,000 Units Subcutaneous Q8H  . hydrochlorothiazide  25 mg Oral Daily  . insulin aspart  0-5 Units Subcutaneous QHS  . insulin aspart  0-9 Units Subcutaneous TID WC  . insulin glargine  10 Units Subcutaneous QHS  . lisinopril  20 mg Oral Daily  . sodium chloride flush  3 mL Intravenous Q12H  . terazosin  2 mg Oral QHS   Continuous Infusions:  PRN Meds: ALPRAZolam, HYDROcodone-acetaminophen  Allergies:    Allergies  Allergen Reactions  . Penicillins Hives, Nausea Only and Rash    Has patient had a PCN reaction causing immediate rash, facial/tongue/throat swelling, SOB or lightheadedness with hypotension: Yes Has patient had a PCN reaction causing severe rash involving mucus membranes or skin necrosis: No Has patient had a PCN reaction that required hospitalization: No Has patient had a PCN reaction occurring within the last 10 years: No If all of the above answers are "NO", then may proceed with Cephalosporin use.   Marland Kitchen Zoloft [Sertraline Hcl] Other (See Comments)    Patient reported Psychomotor slowing / worsened depression  . Chantix [Varenicline] Hives    Social History:   Social History   Social History  . Marital status: Single    Spouse name: N/A  . Number of children: N/A  . Years of education: 71   Occupational History  . [employer]   . Truck driver (retired)    Social History Main Topics  . Smoking status: Current Every Day Smoker    Packs/day: 1.00    Years: 46.00    Types: Cigarettes  . Smokeless tobacco: Never Used  . Alcohol use 0.6 oz/week    1 Glasses of wine per week     Comment: 07/17/2014 "glass of wine once or twice/month"  . Drug use: No  . Sexual activity: Not Currently    Partners: Male     Comment: given condoms   Other Topics Concern  . Not on file   Social History Narrative   [employer] approved beginning 12/11/2009 - 12/27/2010   Riley Jones  benefits approved; patient eligible for 100% discount for out patient labs and office visits. Patient eligible for 70% discount for other services per Irish Elders 09/08/2010      Patient is on disability, and walks 2-3 times per week.     Family History:   The patient's family history includes Heart attack in his brother and father; Heart disease in his father; Osteoporosis in his mother; Stroke in his maternal grandfather, maternal grandmother, paternal grandfather, and paternal grandmother.  ROS:  Please see the history of present illness.  ROS  All other ROS reviewed and negative.     Physical Exam/Data:   Vitals:   02/26/17 1809 02/26/17 2219 02/27/17 0500 02/27/17 0811  BP: 135/70 131/75 (!) 103/59 110/71  Pulse: 86  64 64  Resp: 18  18   Temp: 98.1 F (36.7 C)  98 F (36.7 C)   TempSrc: Oral  Oral   SpO2: 98%  100%   Weight: 189 lb 8 oz (86 kg)  189 lb (85.7 kg)   Height: 5\' 10"  (1.778 m)       Intake/Output Summary (Last 24 hours) at 02/27/17 1333 Last data filed at 02/27/17 0100  Gross per 24 hour  Intake                0 ml  Output                0 ml  Net                0 ml   Filed Weights   02/26/17 1809 02/27/17 0500  Weight: 189 lb 8 oz (86 kg) 189 lb (85.7 kg)   Body mass index is 27.12 kg/m.  General:  Well nourished, well developed, in no acute distress HEENT: normal Lymph: no adenopathy Neck: no JVD Endocrine:  No thryomegaly Cardiac:  normal S1, S2; RRR; no murmur  Lungs:  clear to auscultation bilaterally, no wheezing, rhonchi or rales  Abd: soft, nontender, no hepatomegaly  Ext: no edema Musculoskeletal:  No deformities, BUE and BLE strength normal and equal Skin: warm and dry  Neuro:  CNs 2-12 intact, no focal abnormalities noted Psych:  Normal affect      Relevant CV Studies: Echo (pending)  Laboratory Data:  Chemistry Recent Labs Lab 02/26/17 2018  NA 135  K 3.8  CL 98*  CO2 26  GLUCOSE 396*  BUN 17  CREATININE 0.98    CALCIUM 9.7  GFRNONAA >60  GFRAA >60  ANIONGAP 11     Recent Labs Lab 02/26/17 2018  PROT 6.5  ALBUMIN 3.8  AST 31  ALT 39  ALKPHOS 94  BILITOT 0.7   Hematology Recent Labs Lab 02/26/17 2018  WBC 5.9  RBC 4.75  HGB 15.3  HCT 44.8  MCV 94.3  MCH 32.2  MCHC 34.2  RDW 13.7  PLT 245   Cardiac Enzymes Recent Labs Lab 02/26/17 1459 02/26/17 2018 02/27/17 0354  TROPONINI 0.03* 0.04* 0.03*   No results for input(s): TROPIPOC in the last 168 hours.  BNPNo results for input(s): BNP, PROBNP in the last 168 hours.  DDimer No results for input(s): DDIMER in the last 168 hours.  Radiology/Studies:  X-ray Chest Pa And Lateral  Result Date: 02/26/2017 CLINICAL DATA:  Syncope and chest pain EXAM: CHEST  2 VIEW COMPARISON:  Chest CT 12/09/2016 FINDINGS: Mild cardiomegaly. No pulmonary edema or focal airspace consolidation. No pneumothorax or pleural effusion. IMPRESSION: Mild cardiomegaly without active cardiopulmonary disease. Electronically Signed   By: Ulyses Jarred M.D.   On: 02/26/2017 22:22    Assessment and Plan:   1. Chest pain: Unclear if symptoms are truly ischemic in etiology. Troponins are minimally elevated and flat. ECG is abnormal. He had an LAD stent placed in 2015. He had a normal stress test in March 2016. CT chest detailed above demonstrated coronary artery calcifications. Echocardiogram has been ordered and is pending. I will arrange for nuclear stress testing to be performed on Sunday, June 3. Continue aspirin, Lipitor, Plavix, and carvedilol.  2. Hypertension: Controlled on carvedilol and hydrochlorothiazide along with lisinopril. No changes.  3. Hyperlipidemia: Continue Lipitor 40 mg.  4. HIV: On Atripla.   Signed, Kate Sable, MD  02/27/2017 1:33 PM

## 2017-02-28 ENCOUNTER — Observation Stay (HOSPITAL_BASED_OUTPATIENT_CLINIC_OR_DEPARTMENT_OTHER): Payer: Medicare HMO

## 2017-02-28 DIAGNOSIS — Z955 Presence of coronary angioplasty implant and graft: Secondary | ICD-10-CM | POA: Diagnosis not present

## 2017-02-28 DIAGNOSIS — R079 Chest pain, unspecified: Secondary | ICD-10-CM | POA: Diagnosis not present

## 2017-02-28 DIAGNOSIS — Z79891 Long term (current) use of opiate analgesic: Secondary | ICD-10-CM | POA: Diagnosis not present

## 2017-02-28 DIAGNOSIS — I10 Essential (primary) hypertension: Secondary | ICD-10-CM | POA: Diagnosis not present

## 2017-02-28 DIAGNOSIS — G8929 Other chronic pain: Secondary | ICD-10-CM | POA: Diagnosis not present

## 2017-02-28 DIAGNOSIS — M069 Rheumatoid arthritis, unspecified: Secondary | ICD-10-CM | POA: Diagnosis not present

## 2017-02-28 DIAGNOSIS — Z7982 Long term (current) use of aspirin: Secondary | ICD-10-CM | POA: Diagnosis not present

## 2017-02-28 DIAGNOSIS — I2510 Atherosclerotic heart disease of native coronary artery without angina pectoris: Secondary | ICD-10-CM | POA: Diagnosis not present

## 2017-02-28 DIAGNOSIS — Z880 Allergy status to penicillin: Secondary | ICD-10-CM | POA: Diagnosis not present

## 2017-02-28 DIAGNOSIS — J449 Chronic obstructive pulmonary disease, unspecified: Secondary | ICD-10-CM | POA: Diagnosis not present

## 2017-02-28 DIAGNOSIS — F17210 Nicotine dependence, cigarettes, uncomplicated: Secondary | ICD-10-CM | POA: Diagnosis not present

## 2017-02-28 DIAGNOSIS — B20 Human immunodeficiency virus [HIV] disease: Secondary | ICD-10-CM | POA: Diagnosis not present

## 2017-02-28 DIAGNOSIS — E119 Type 2 diabetes mellitus without complications: Secondary | ICD-10-CM | POA: Diagnosis not present

## 2017-02-28 DIAGNOSIS — I25118 Atherosclerotic heart disease of native coronary artery with other forms of angina pectoris: Secondary | ICD-10-CM | POA: Diagnosis not present

## 2017-02-28 DIAGNOSIS — Z79899 Other long term (current) drug therapy: Secondary | ICD-10-CM | POA: Diagnosis not present

## 2017-02-28 DIAGNOSIS — Z794 Long term (current) use of insulin: Secondary | ICD-10-CM | POA: Diagnosis not present

## 2017-02-28 DIAGNOSIS — E782 Mixed hyperlipidemia: Secondary | ICD-10-CM | POA: Diagnosis not present

## 2017-02-28 DIAGNOSIS — I200 Unstable angina: Secondary | ICD-10-CM | POA: Diagnosis not present

## 2017-02-28 DIAGNOSIS — F17219 Nicotine dependence, cigarettes, with unspecified nicotine-induced disorders: Secondary | ICD-10-CM | POA: Diagnosis not present

## 2017-02-28 DIAGNOSIS — Z7902 Long term (current) use of antithrombotics/antiplatelets: Secondary | ICD-10-CM | POA: Diagnosis not present

## 2017-02-28 DIAGNOSIS — R32 Unspecified urinary incontinence: Secondary | ICD-10-CM | POA: Diagnosis not present

## 2017-02-28 DIAGNOSIS — J45909 Unspecified asthma, uncomplicated: Secondary | ICD-10-CM | POA: Diagnosis not present

## 2017-02-28 DIAGNOSIS — E1165 Type 2 diabetes mellitus with hyperglycemia: Secondary | ICD-10-CM | POA: Diagnosis not present

## 2017-02-28 LAB — ECHOCARDIOGRAM COMPLETE
AVAREAVTI: 3.2 cm2
AVPG: 13 mmHg
AVPKVEL: 183 cm/s
Ao pk vel: 0.84 m/s
CHL CUP AV PEAK INDEX: 1.54
CHL CUP MV DEC (S): 268
E decel time: 268 msec
FS: 36 % (ref 28–44)
Height: 70 in
IV/PV OW: 0.91
LA diam end sys: 39 mm
LA diam index: 1.88 cm/m2
LA vol A4C: 52.7 ml
LA vol index: 30.1 mL/m2
LA vol: 62.4 mL
LASIZE: 39 mm
LDCA: 3.8 cm2
LV TDI E'LATERAL: 10.9
LV TDI E'MEDIAL: 7.83
LVELAT: 10.9 cm/s
LVOT VTI: 29.6 cm
LVOT diameter: 22 mm
LVOT peak grad rest: 9 mmHg
LVOTPV: 154 cm/s
LVOTSV: 112 mL
Lateral S' vel: 17 cm/s
MV pk E vel: 1 m/s
PW: 11 mm — AB (ref 0.6–1.1)
RV TAPSE: 19.4 mm
Weight: 3017.6 oz

## 2017-02-28 LAB — BASIC METABOLIC PANEL
ANION GAP: 8 (ref 5–15)
BUN: 21 mg/dL — AB (ref 6–20)
CO2: 24 mmol/L (ref 22–32)
Calcium: 9.1 mg/dL (ref 8.9–10.3)
Chloride: 99 mmol/L — ABNORMAL LOW (ref 101–111)
Creatinine, Ser: 0.77 mg/dL (ref 0.61–1.24)
GFR calc Af Amer: >60 mL/min (ref >60)
GLUCOSE: 236 mg/dL — AB (ref 65–99)
Potassium: 3.6 mmol/L (ref 3.5–5.1)
SODIUM: 131 mmol/L — AB (ref 135–145)

## 2017-02-28 LAB — NM MYOCAR MULTI W/SPECT W/WALL MOTION / EF: Rest HR: 62 {beats}/min

## 2017-02-28 LAB — GLUCOSE, CAPILLARY
GLUCOSE-CAPILLARY: 247 mg/dL — AB (ref 65–99)
Glucose-Capillary: 304 mg/dL — ABNORMAL HIGH (ref 65–99)
Glucose-Capillary: 288 mg/dL — ABNORMAL HIGH (ref 65–99)

## 2017-02-28 MED ORDER — TECHNETIUM TC 99M TETROFOSMIN IV KIT
10.0000 | PACK | Freq: Once | INTRAVENOUS | Status: AC | PRN
  Administered 2017-02-28: 10 via INTRAVENOUS

## 2017-02-28 MED ORDER — CARVEDILOL 3.125 MG PO TABS: 3.1250 mg | ORAL_TABLET | Freq: Two times a day (BID) | ORAL | 1 refills | Status: DC

## 2017-02-28 MED ORDER — INSULIN GLARGINE 100 UNIT/ML ~~LOC~~ SOLN
12.0000 [IU] | Freq: Every day | SUBCUTANEOUS | Status: DC
  Filled 2017-02-28: qty 0.12

## 2017-02-28 MED ORDER — TECHNETIUM TC 99M TETROFOSMIN IV KIT
30.0000 | PACK | Freq: Once | INTRAVENOUS | Status: AC | PRN
  Administered 2017-02-28: 30 via INTRAVENOUS

## 2017-02-28 MED ORDER — REGADENOSON 0.4 MG/5ML IV SOLN
0.4000 mg | Freq: Once | INTRAVENOUS | Status: AC
  Administered 2017-02-28: 0.4 mg via INTRAVENOUS

## 2017-02-28 MED ORDER — REGADENOSON 0.4 MG/5ML IV SOLN
INTRAVENOUS | Status: AC
  Filled 2017-02-28: qty 5

## 2017-02-28 NOTE — Plan of Care (Signed)
Problem: Pain Managment: Goal: General experience of comfort will improve Outcome: Progressing Pt denies any chest pain

## 2017-02-28 NOTE — Progress Notes (Signed)
Progress Note  Patient Name: Riley Jones Date of Encounter: 02/28/2017  Primary Cardiologist: Johnsie Cancel  Subjective   Denies chest pain and shortness of breath. Awaiting stress test results. Echocardiogram about to be performed.  Inpatient Medications    Scheduled Meds: . aspirin EC  81 mg Oral Daily  . atorvastatin  40 mg Oral q1800  . carvedilol  3.125 mg Oral BID WC  . clopidogrel  75 mg Oral Daily  . DULoxetine  60 mg Oral Daily  . efavirenz-emtricitabine-tenofovir  1 tablet Oral Daily  . heparin  5,000 Units Subcutaneous Q8H  . hydrochlorothiazide  25 mg Oral Daily  . insulin aspart  0-5 Units Subcutaneous QHS  . insulin aspart  0-9 Units Subcutaneous TID WC  . insulin glargine  12 Units Subcutaneous QHS  . lisinopril  20 mg Oral Daily  . regadenoson      . sodium chloride flush  3 mL Intravenous Q12H  . terazosin  2 mg Oral QHS   Continuous Infusions:  PRN Meds: ALPRAZolam, HYDROcodone-acetaminophen   Vital Signs    Vitals:   02/28/17 0942 02/28/17 0944 02/28/17 0946 02/28/17 1054  BP: 130/75 122/62 127/66 122/65  Pulse: 75 100 87   Resp:    16  Temp:    98.1 F (36.7 C)  TempSrc:    Oral  SpO2:    98%  Weight:      Height:        Intake/Output Summary (Last 24 hours) at 02/28/17 1207 Last data filed at 02/28/17 1026  Gross per 24 hour  Intake              710 ml  Output                0 ml  Net              710 ml   Filed Weights   02/26/17 1809 02/27/17 0500 02/28/17 0452  Weight: 189 lb 8 oz (86 kg) 189 lb (85.7 kg) 188 lb 9.6 oz (85.5 kg)    Telemetry    NSR - Personally Reviewed  ECG      Physical Exam   GEN: No acute distress.   Neck: No JVD Cardiac: RRR, no murmurs, rubs, or gallops.  Respiratory: Clear to auscultation bilaterally. GI: Soft, nontender, non-distended  MS: No edema; No deformity. Neuro:  Nonfocal  Psych: Normal affect   Labs    Chemistry Recent Labs Lab 02/26/17 2018 02/28/17 0346  NA 135 131*  K  3.8 3.6  CL 98* 99*  CO2 26 24  GLUCOSE 396* 236*  BUN 17 21*  CREATININE 0.98 0.77  CALCIUM 9.7 9.1  PROT 6.5  --   ALBUMIN 3.8  --   AST 31  --   ALT 39  --   ALKPHOS 94  --   BILITOT 0.7  --   GFRNONAA >60 >60  GFRAA >60 >60  ANIONGAP 11 8     Hematology Recent Labs Lab 02/26/17 2018  WBC 5.9  RBC 4.75  HGB 15.3  HCT 44.8  MCV 94.3  MCH 32.2  MCHC 34.2  RDW 13.7  PLT 245    Cardiac Enzymes Recent Labs Lab 02/26/17 1459 02/26/17 2018 02/27/17 0354  TROPONINI 0.03* 0.04* 0.03*   No results for input(s): TROPIPOC in the last 168 hours.   BNPNo results for input(s): BNP, PROBNP in the last 168 hours.   DDimer No results for input(s): DDIMER in  the last 168 hours.   Radiology    X-ray Chest Pa And Lateral  Result Date: 02/26/2017 CLINICAL DATA:  Syncope and chest pain EXAM: CHEST  2 VIEW COMPARISON:  Chest CT 12/09/2016 FINDINGS: Mild cardiomegaly. No pulmonary edema or focal airspace consolidation. No pneumothorax or pleural effusion. IMPRESSION: Mild cardiomegaly without active cardiopulmonary disease. Electronically Signed   By: Ulyses Jarred M.D.   On: 02/26/2017 22:22    Cardiac Studies   Lexiscan and echocardiogram both pending  Patient Profile     61 y.o. male with a past medical history significant for coronary artery disease with LAD stent in 2015, type 2 diabetes, HIV, rheumatoid arthritis, hyperlipidemia, hypertension, and chronic pain admitted with chest pain and urinary incontinence.  Assessment & Plan    1. Chest pain: Unclear if symptoms are truly ischemic in etiology. Troponins were minimally elevated and flat. ECG is abnormal. He had an LAD stent placed in 2015. He had a normal stress test in March 2016. CT chest demonstrated coronary artery calcifications. Echocardiogram has been ordered and is pending. Nuclear stress test results also pending. Continue aspirin, Lipitor, Plavix, and carvedilol.  2. Hypertension: Elevated earlier but  controlled at present. Continue carvedilol, hydrochlorothiazide, and lisinopril.  3. Hyperlipidemia: Continue Lipitor 40 mg.  4. HIV: On Atripla.  Signed, Kate Sable, MD  02/28/2017, 12:07 PM

## 2017-02-28 NOTE — Discharge Instructions (Signed)
Riley Jones,  The stress test was reassuring, which means your symptoms were less likely related to your heart at this time.  Please continue your home medications.  We are starting a new medication called Carvedilol twice a day for your heart.  Please work on healthy eating patterns and taking your insulin for better diabetes control. Follow up in the Internal Medicine Clinic for adjustment of your insulin as needed.

## 2017-02-28 NOTE — Discharge Summary (Signed)
Name: Riley Jones MRN: [id number] DOB: 06/01/1956 61 y.o. PCP: Aldine Contes, MD  Date of Admission: 02/26/2017  5:59 PM Date of Discharge: 02/28/2017 Attending Physician: Sid Falcon, MD  Discharge Diagnosis: 1. Chest pain most likely unstable angina 2. Urinary incontinence 3. Chronic medical conditions Active Problems:   Human immunodeficiency virus (HIV) disease ([place])   Diabetes mellitus ([place])   Nicotine dependence   Essential hypertension   Rheumatoid arthritis ([place])   Unstable angina (H[facility])   Chest pain   Discharge Medications: Allergies as of 02/28/2017      Reactions   Penicillins Hives, Nausea Only, Rash   Has patient had a PCN reaction causing immediate rash, facial/tongue/throat swelling, SOB or lightheadedness with hypotension: Yes Has patient had a PCN reaction causing severe rash involving mucus membranes or skin necrosis: No Has patient had a PCN reaction that required hospitalization: No Has patient had a PCN reaction occurring within the last 10 years: No If all of the above answers are "NO", then may proceed with Cephalosporin use.   Zoloft [sertraline Hcl] Other (See Comments)   Patient reported Psychomotor slowing / worsened depression   Chantix [varenicline] Hives      Medication List    STOP taking these medications   cetirizine 10 MG tablet Commonly known as:  ZYRTEC     TAKE these medications   ACCU-CHEK SMARTVIEW test strip Generic drug:  glucose blood CHECK BLOOD SUGAR 4 TIMES A DAY   ALPRAZolam 0.5 MG tablet Commonly known as:  XANAX Take 1 tablet (0.5 mg total) by mouth at bedtime as needed for anxiety.   aspirin 81 MG EC tablet Take 81 mg by mouth daily.   atorvastatin 40 MG tablet Commonly known as:  LIPITOR Take 1 tablet (40 mg total) by mouth daily.   ATRIPLA 600-200-300 MG tablet Generic drug:  efavirenz-emtricitabine-tenofovir TAKE 1 TABLET BY MOUTH DAILY   BD INSULIN SYRINGE ULTRAFINE 31G X 15/64" 0.3 ML  Misc Generic drug:  Insulin Syringe-Needle U-100 USE TO INJECT INSULIN 2 TIMES A DAY   buPROPion 150 MG 12 hr tablet Commonly known as:  ZYBAN Take 1 tablet (150 mg total) by mouth 2 (two) times daily.   carvedilol 3.125 MG tablet Commonly known as:  COREG Take 1 tablet (3.125 mg total) by mouth 2 (two) times daily with a meal.   clopidogrel 75 MG tablet Commonly known as:  PLAVIX TAKE ONE TABLET BY MOUTH ONCE DAILY.   DULoxetine 60 MG capsule Commonly known as:  CYMBALTA Take 1 capsule (60 mg total) by mouth daily.   empagliflozin 10 MG Tabs tablet Commonly known as:  JARDIANCE Take 10 mg by mouth daily.   fluticasone 50 MCG/ACT nasal spray Commonly known as:  FLONASE Place 1 spray into both nostrils daily.   folic acid 1 MG tablet Commonly known as:  FOLVITE Take 1 tablet (1 mg total) by mouth daily.   hydrochlorothiazide 25 MG tablet Commonly known as:  HYDRODIURIL TAKE ONE TABLET BY MOUTH ONCE DAILY   HYDROcodone-acetaminophen 7.5-325 MG tablet Commonly known as:  NORCO Take 1 tablet by mouth every 6 (six) hours as needed for moderate pain.   Insulin Pen Needle 31G X 5 MM Misc Commonly known as:  B-D UF III MINI PEN NEEDLES Inject victoza once daily   lisinopril 20 MG tablet Commonly known as:  PRINIVIL,ZESTRIL Take 1 tablet (20 mg total) by mouth daily.   loratadine 10 MG tablet Commonly known as:  CLARITIN Take 1  tablet (10 mg total) by mouth daily.   metFORMIN 1000 MG tablet Commonly known as:  GLUCOPHAGE Take 1 tablet (1,000 mg total) by mouth 2 (two) times daily with a meal.   montelukast 10 MG tablet Commonly known as:  SINGULAIR Take 1 tablet (10 mg total) by mouth daily.   NITROSTAT 0.4 MG SL tablet Generic drug:  nitroGLYCERIN PLACE 1 TABLET UNDER THE TONGUE EVERY 5 MINUTES AS NEEDED FOR CHEST PAIN   NOVOLIN N RELION 100 UNIT/ML injection Generic drug:  insulin NPH Human INJECT 17 UNITS INTO THE SKIN IN THE MORNING AND 20 UNITS IN THE  EVENING BEFORE MEALS.   terazosin 2 MG capsule Commonly known as:  HYTRIN Take 1 capsule (2 mg total) by mouth at bedtime.   VENTOLIN HFA 108 (90 Base) MCG/ACT inhaler Generic drug:  albuterol Inhale 1 puff into the lungs every 4 (four) hours as needed. for shortness of breath. Or may use every 6 hours as needed.       Disposition and follow-up:   RileyAlfard J Jones was discharged from [facility] in Good condition.  At the hospital follow up visit please address:  1.  Please ensure the patient follows up with cardiology. Please work on further glucose control in this poorly controlled diabetic.  2.  Labs / imaging needed at time of follow-up: Basic metabolic panel  3.  Pending labs/ test needing follow-up: None  Follow-up Appointments: Follow-up Information    [name], MD. Call in 3 day(s).   Specialty:  Cardiology Contact information: [address] [address] [phone]        [facility]. Schedule an appointment as soon as possible for a visit in 1 week(s).   Contact information: [address] [facility] [place] [facility] Course by problem list: Active Problems:   Human immunodeficiency virus (HIV) disease ([place])   Diabetes mellitus ([place])   Nicotine dependence   Essential hypertension   Rheumatoid arthritis ([place])   Unstable angina (HCC)   Chest pain   1. Unstable angina.  The patient was admitted to the [facility] on 02/26/2017 from the [facility] internal medicine [facility] clinic with suspected unstable angina. The patient is extremely high risk from a cardiac standpoint with a complicated medical history including diabetes, hypertension, tobacco abuse, coronary artery disease and rheumatoid arthritis. In clinic, he told the provider he had had several episodes of chest pain at rest which is new for him. He was admitted for chest pain  rule out. EKG showed some new T wave inversions in the lateral leads and the patient had a peak troponin of 0.04. Cardiology was consulted who performed a nuclear stress test which was read as low risk. Transthoracic echocardiogram showed an ejection fraction of 60-65%, no wall motion abnormality, mild aortic and mitral calcification. He was discharged on dual antiplatelet therapy with aspirin and Plavix as well as atorvastatin. He'll have follow-up in clinic and with cardiology.  2. Urinary incontinence Patient endorsed a single episode of urinary incontinence. He has not had more than a single isolated episode. He does not have the feeling as if he is unable to empty his bladder. This was a symptom he reported in clinic and he had no episodes while in the hospital. No dysuria. I do not suspect this was secondary to urinary tract infection. I think the most likely  etiology of the patient's urinary incontinence was poorly controlled diabetes leading to a single isolated episode. If he continues to have additional episodes of urinary incontinence I would suggest doing a more thorough evaluation in the outpatient setting to include prostate exam, urinalysis and other tests as necessary.  3. Chronic medical conditions The patient will be discharged on his home medications. Only cetirizine was discontinued.  Discharge Vitals:   BP 115/63 (BP Location: Left Arm)   Pulse 87   Temp 98.1 F (36.7 C) (Oral)   Resp 16   Ht 5\' 10"  (1.778 m)   Wt 188 lb 9.6 oz (85.5 kg)   SpO2 98%   BMI 27.06 kg/m   Pertinent Labs, Studies, and Procedures:  1. Cardiac nuclear stress test-  low risk study, for details please see official report in Epic 2. Transthoracic echocardiogram-normal left ventricular ejection fraction. For more details please see official report in Epic  Discharge Instructions: Discharge Instructions    Call MD for:  difficulty breathing, headache or visual disturbances    Complete by:  As  directed    Call MD for:  hives    Complete by:  As directed    Call MD for:  persistant dizziness or light-headedness    Complete by:  As directed    Call MD for:  persistant nausea and vomiting    Complete by:  As directed    Call MD for:  severe uncontrolled pain    Complete by:  As directed    Call MD for:  temperature >100.4    Complete by:  As directed    Diet - low sodium heart healthy    Complete by:  As directed    Discharge instructions    Complete by:  As directed    Assess for further chest pain. Assess diabetic control and insulin adjustments.   Increase activity slowly    Complete by:  As directed       Signed: Zada Finders, MD 02/28/2017, 4:40 PM

## 2017-02-28 NOTE — Progress Notes (Signed)
  Echocardiogram 2D Echocardiogram has been performed.  Riley Jones [name] 02/28/2017, 12:52 PM

## 2017-02-28 NOTE — Progress Notes (Signed)
  Date: 02/28/2017  Patient name: Riley Jones  Medical record number: [id number]  Date of birth: March 08, 1956   I have seen and evaluated this patient and I have discussed the plan of care with the house staff. Please see Dr. Serita Grit upcoming note for complete details.   The patient is doing well this morning, no further chest pain.  He has had an MPS today, reading pending.  He was undergoing TTE when I saw him and was in good spirits.    Sid Falcon, MD 02/28/2017, 2:14 PM

## 2017-02-28 NOTE — Progress Notes (Signed)
   Subjective:  Patient feels well today when seen after his stress test. He denies any chest pain, palpitations, SOB, N/V, or diaphoresis. He is eager to go home. Stress test was low risk and TTE without significant abnormality.  Objective:  Vital signs in last 24 hours: Vitals:   02/28/17 0944 02/28/17 0946 02/28/17 1054 02/28/17 1457  BP: 122/62 127/66 122/65 115/63  Pulse: 100 87    Resp:   16   Temp:   98.1 F (36.7 C)   TempSrc:   Oral   SpO2:   98% 98%  Weight:      Height:       General: sitting up in bed, no acute distress Cardiac: RRR, no rubs, murmurs or gallops Pulm: clear to auscultation bilaterally, moving normal volumes of air Abd: soft, nontender, nondistended Ext: warm and well perfused, no pedal edema Neuro: alert and oriented X3  Assessment/Plan:  Active Problems:   Human immunodeficiency virus (HIV) disease (HCC)   Diabetes mellitus ([place])   Nicotine dependence   Essential hypertension   Rheumatoid arthritis ([place])   Unstable angina (HCC)   Chest pain  # Chest Pain Nuclear stress test is low risk. TTE with EF 60-65%, no wall motion abnormality, mild aortic and mitral calcification. -- Appreciate Cardiology assistance -- [place] home Aspirin and Plavix daily, Atorvastatin -- Continue Carvedilol on discharge -- f/u in [facility] and with Cardiology outpatient  # T2DM  Poorly controll last A1c 9.6 in 11/2016, glucose 470 tonight. On Metformin 1000 BID and Novolin 17 units qAM and 20 units qPM. Was not able to afford Empagliflozin. -- Insulin aspart 10 units once now for glucose 470 -- Lantus 10 units QHS -- SSI-S TID AC HS -- f/u in [facility] for insulin adjustment, possible V-go candidate, medication assistance  # HTN  -- home HCTZ 25 mg and Lisinopril 20 mg daily  # HIV  well controlled, undetectable VL and CD4 870 Feb 2018 -- home Atripla QD (Efavirenz-emtricitabine-tenofovir)  # Depression/Anxiety  -- home Wellbutrin, Cymbalta, and prn Xanax  QHS  # Rheumatoid arthritis  Previously on MTX but could not tolerate, with chronic pain requiring opioids, on Leflunomide per his rheumatologist (Dr. Amil Amen). -- home Norco 7.5-325 Q6h prn for pain -- reconcile Leflunomide dose   Dispo: Anticipated discharge today.  Zada Finders, MD 02/28/2017, 4:32 PM\

## 2017-03-01 NOTE — Progress Notes (Signed)
Internal Medicine Clinic Attending  Case discussed with Dr. O'Sullivan at the time of the visit.  We reviewed the resident's history and exam and pertinent patient test results.  I agree with the assessment, diagnosis, and plan of care documented in the resident's note. 

## 2017-03-06 ENCOUNTER — Other Ambulatory Visit: Payer: Self-pay | Admitting: Internal Medicine

## 2017-03-06 DIAGNOSIS — F32A Depression, unspecified: Secondary | ICD-10-CM

## 2017-03-06 DIAGNOSIS — F329 Major depressive disorder, single episode, unspecified: Secondary | ICD-10-CM

## 2017-03-09 NOTE — Progress Notes (Signed)
Cardiology Office Note   Date:  03/11/2017   ID:  [name], DOB Jan 16, 1956, MRN [id number]  PCP:  Aldine Contes, MD  Cardiologist:  Dr. Johnsie Cancel    Chief Complaint  Patient presents with  . Hospitalization Follow-up      History of Present Illness: Riley Jones is a 61 y.o. male who presents for post hospitalization for chest pain. He also was asleep or had fainted on bed and voided incontinently   He has prior hx of CAD with LAD stent in 2015, DM-2, HIV, RA, HLD, HTN and chronic pain.  Was admitted for chest pain. Troponin 0.03 to 0.04.  SR with RBBB and non specific T wave abnormalities.   Chest CT 12/09/16 demonstrated aortic atherosclerosis and atherosclerosis of the left main, LAD, circumflex, and right coronary arteries. There were also calcifications of the aortic and mitral annulus.  Echo with mild LVH, EF 60-65%, no RWMA,  Lexiscan  myoview with EF 53% normal study and low risk.    Today feels well occ brief episode of discomfort he believes is GI, he is on PPI but does not know name.  He is watching his diet for his diabetes.  We discussed stopping tobacco. He has tried Chantix he had rash and patches cause rashes.  He does not smoke in his car.  He will try smoking outside his home as well.   He is active with work and walking frequently.  We reviewed echo and lexiscan.       Past Medical History:  Diagnosis Date  . Anxiety   . Arthritis    "pretty much all over"  . Asthma    "mild" (07/17/2014)  . Atypical angina ([place])    Myoview 2003, Normal EF 64%  . CAD (coronary artery disease)    a. 10/20 DES to 80% mid LAD  . COPD (chronic obstructive pulmonary disease) ([place])    "mild" (07/17/2014)  . Depression   . Foot pain 03/30/2011  . GERD (gastroesophageal reflux disease)   . H/O hiatal hernia   . Headache    "weekly" (06/2014)  . History of gout   . HIV (human immunodeficiency virus infection) ([place]) 1995   DX after shingles followed by Dr. Ola Spurr  (ID)  . Hx of cardiovascular stress test    ETT-Myoview (3/16):  Ex 10:15, No Ischemia, EF 57%; Normal Study  . Hyperlipidemia   . Hypertension    Well Controlled off meds  . Kidney stones    "passed them all"  . Pneumonia 4-5 times  . Rheumatoid arthritis(714.0)    Followed by Dr. Charlestine Night, off MTX and prdnisone since 2007  . Sleep apnea    does not wear mask (07/17/2014)  . Type II diabetes mellitus ([place])     Past Surgical History:  Procedure Laterality Date  . APPENDECTOMY  ~ 1981  . CARPAL TUNNEL RELEASE Left 1990's  . CORONARY ANGIOPLASTY WITH STENT PLACEMENT  07/17/2014   a. DES to 80% mid LAD  . HERNIA REPAIR    . LAPAROSCOPIC INCISIONAL / UMBILICAL / VENTRAL HERNIA REPAIR  1990's   "it was a double; not inguinal"  . LEFT HEART CATHETERIZATION WITH CORONARY ANGIOGRAM N/A 07/17/2014   Procedure: LEFT HEART CATHETERIZATION WITH CORONARY ANGIOGRAM;  Surgeon: [name], MD;  Location: [facility] CATH LAB;  Service: Cardiovascular;  Laterality: N/A;  . PERCUTANEOUS STENT INTERVENTION  07/17/2014   Procedure: PERCUTANEOUS STENT INTERVENTION;  Surgeon: [name], MD;  Location: [facility] CATH  LAB;  Service: Cardiovascular;;  . TONSILLECTOMY AND ADENOIDECTOMY  1990's  . TRIGGER FINGER RELEASE Left 1990's   2nd & 5th digits  . UMBILICAL HERNIA REPAIR  1980's   w/mesh  . UVULOPALATOPHARYNGOPLASTY (UPPP)/TONSILLECTOMY/SEPTOPLASTY  1990's     Current Outpatient Prescriptions  Medication Sig Dispense Refill  . ACCU-CHEK SMARTVIEW test strip CHECK BLOOD SUGAR 4 TIMES A DAY 150 each 6  . albuterol (VENTOLIN HFA) 108 (90 BASE) MCG/ACT inhaler Inhale 1 puff into the lungs every 4 (four) hours as needed. for shortness of breath. Or may use every 6 hours as needed.    . ALPRAZolam (XANAX) 0.5 MG tablet Take 1 tablet (0.5 mg total) by mouth at bedtime as needed for anxiety. 30 tablet 0  . aspirin 81 MG EC tablet Take 81 mg by mouth daily.      Marland Kitchen atorvastatin (LIPITOR) 40 MG tablet Take 1  tablet (40 mg total) by mouth daily. 90 tablet 1  . ATRIPLA 600-200-300 MG tablet TAKE 1 TABLET BY MOUTH DAILY 30 tablet 2  . BD INSULIN SYRINGE ULTRAFINE 31G X 15/64" 0.3 ML MISC USE TO INJECT INSULIN 2 TIMES A DAY 100 each 4  . carvedilol (COREG) 3.125 MG tablet Take 1 tablet (3.125 mg total) by mouth 2 (two) times daily with a meal. 60 tablet 1  . clopidogrel (PLAVIX) 75 MG tablet TAKE ONE TABLET BY MOUTH ONCE DAILY. 90 tablet 3  . DULoxetine (CYMBALTA) 60 MG capsule TAKE 1 CAPSULE BY MOUTH ONCE DAILY 90 capsule 0  . fluticasone (FLONASE) 50 MCG/ACT nasal spray Place 1 spray into both nostrils daily. 16 g 2  . folic acid (FOLVITE) 1 MG tablet Take 1 tablet (1 mg total) by mouth daily. 90 tablet 3  . hydrochlorothiazide (HYDRODIURIL) 25 MG tablet TAKE ONE TABLET BY MOUTH ONCE DAILY 90 tablet 1  . HYDROcodone-acetaminophen (NORCO) 7.5-325 MG tablet Take 1 tablet by mouth every 6 (six) hours as needed for moderate pain. 60 tablet 0  . Insulin Pen Needle (B-D UF III MINI PEN NEEDLES) 31G X 5 MM MISC Inject victoza once daily 100 each 3  . lisinopril (PRINIVIL,ZESTRIL) 20 MG tablet Take 1 tablet (20 mg total) by mouth daily. 90 tablet 3  . loratadine (CLARITIN) 10 MG tablet Take 1 tablet (10 mg total) by mouth daily. 90 tablet 3  . metFORMIN (GLUCOPHAGE) 1000 MG tablet Take 1 tablet (1,000 mg total) by mouth 2 (two) times daily with a meal. 180 tablet 0  . NITROSTAT 0.4 MG SL tablet PLACE 1 TABLET UNDER THE TONGUE EVERY 5 MINUTES AS NEEDED FOR CHEST PAIN 25 tablet 3  . NOVOLIN N RELION 100 UNIT/ML injection INJECT 17 UNITS INTO THE SKIN IN THE MORNING AND 20 UNITS IN THE EVENING BEFORE MEALS. 10 mL 3  . terazosin (HYTRIN) 2 MG capsule Take 1 capsule (2 mg total) by mouth at bedtime. 90 capsule 1  . triamcinolone (KENALOG) 0.025 % cream Apply 1 application topically 2 (two) times daily. 30 g 0   No current facility-administered medications for this visit.     Allergies:   Penicillins; Zoloft  [sertraline hcl]; and Chantix [varenicline]    Social History:  The patient  reports that he has been smoking Cigarettes.  He has a 46.00 pack-year smoking history. He has never used smokeless tobacco. He reports that he drinks about 0.6 oz of alcohol per week . He reports that he does not use drugs.   Family History:  The patient's family  history includes Heart attack in his brother and father; Heart disease in his father; Osteoporosis in his mother; Stroke in his maternal grandfather, maternal grandmother, paternal grandfather, and paternal grandmother.    ROS:  General:no colds or fevers, no weight changes Skin:no rashes or ulcers HEENT:no blurred vision, no congestion CV:see HPI PUL:see HPI GI:occ diarrhea no constipation or melena, no indigestion GU:no hematuria, no dysuria MS:no joint pain, no claudication Neuro:no syncope, no lightheadedness Endo:+ diabetes could not afford Jardiance which would be good, no thyroid disease  Wt Readings from Last 3 Encounters:  03/11/17 197 lb (89.4 kg)  02/28/17 188 lb 9.6 oz (85.5 kg)  02/26/17 195 lb 11.2 oz (88.8 kg)     PHYSICAL EXAM: VS:  BP 130/78 (BP Location: Right Arm, Patient Position: Sitting, Cuff Size: Normal)   Pulse 60   Ht 5\' 10"  (1.778 m)   Wt 197 lb (89.4 kg)   SpO2 98%   BMI 28.27 kg/m  , BMI Body mass index is 28.27 kg/m. General:Pleasant affect, NAD Skin:Warm and dry, brisk capillary refill HEENT:normocephalic, sclera clear, mucus membranes moist Neck:supple, no JVD, no bruits  Heart:S1S2 RRR with soft systolic murmur, no gallup, rub or click Lungs:clear without rales, rhonchi, or wheezes BSW:HQPR, non tender, + BS, do not palpate liver spleen or masses Ext:no lower ext edema, 2+ pedal pulses, 2+ radial pulses Neuro:alert and oriented X 3, MAE, follows commands, + facial symmetry    EKG:  EKG is NOT  ordered today.    Recent Labs: 02/26/2017: ALT 39; Hemoglobin 15.3; Platelets 245 02/28/2017: BUN 21;  Creatinine, Ser 0.77; Potassium 3.6; Sodium 131    Lipid Panel    Component Value Date/Time   CHOL 178 02/26/2017 2018   TRIG 478 (H) 02/26/2017 2018   HDL 40 (L) 02/26/2017 2018   CHOLHDL 4.5 02/26/2017 2018   VLDL UNABLE TO CALCULATE IF TRIGLYCERIDE OVER 400 mg/dL 02/26/2017 2018   LDLCALC UNABLE TO CALCULATE IF TRIGLYCERIDE OVER 400 mg/dL 02/26/2017 2018       Other studies Reviewed: Additional studies/ records that were reviewed today include: . NUC STUDY  02/28/17 Study Result     There was no ST segment deviation noted during stress.  The study is normal.  This is a low risk study.  Nuclear stress EF: 53%.   Low risk stress nuclear study with normal perfusion and low normal left ventricular global systolic function.     ECHO 02/28/17 Study Conclusions  - Left ventricle: The cavity size was normal. Wall thickness was   increased in a pattern of mild LVH. Systolic function was normal.   The estimated ejection fraction was in the range of 60% to 65%.   Wall motion was normal; there were no regional wall motion   abnormalities. - Aortic valve: Trileaflet; mildly calcified leaflets. - Mitral valve: Calcified annulus. - Right ventricle: The cavity size was mildly dilated. Wall   thickness was normal. Systolic function was normal.  ASSESSMENT AND PLAN:  1.  Chest pain has mostly resolved, may be GI, but neg nuc study and LVH on Echo. Follow up with Dr. Johnsie Cancel in 4 months unless problems prior to that time  2.  CAD with hx of DES to mLAD in 2015--CT of chest with atherosclerosis on coronary arteries.   3.  Tobacco use, would like to stop is at 1 ppd now, discussed ways to stop and importance.  4.  DM-2 per PCP currently poorly controlled though he is improving his diet.  5. HIV  6. HTN essential.     Current medicines are reviewed with the patient today.  The patient Has no concerns regarding medicines.  The following changes have been made:  See  above Labs/ tests ordered today include:see above  Disposition:   FU:  see above  Signed, Cecilie Kicks, NP  03/11/2017 8:37 AM    [facility] [address] [place], [place] Phone: [phone]; Fax: [phone]

## 2017-03-10 ENCOUNTER — Other Ambulatory Visit: Payer: Self-pay | Admitting: Internal Medicine

## 2017-03-10 ENCOUNTER — Other Ambulatory Visit: Payer: Self-pay

## 2017-03-10 MED ORDER — TRIAMCINOLONE ACETONIDE 0.025 % EX CREA: 1.0000 "application " | TOPICAL_CREAM | Freq: Two times a day (BID) | CUTANEOUS | 0 refills | Status: DC

## 2017-03-10 NOTE — Telephone Encounter (Signed)
If its in the groin area I would think it would more likely be a fungal rash and that triamcinolone would be less likely to help.

## 2017-03-10 NOTE — Telephone Encounter (Signed)
Refill request for triamcinolon 0.025% oint received not on current med list please send new Rx if you want patient to have this medication

## 2017-03-10 NOTE — Telephone Encounter (Signed)
Pt states that he uses it sporadically for an occasional rash in the scrotal area, he states it is not very often, always has been the same type of rash and the cream always "clears it up"

## 2017-03-11 ENCOUNTER — Ambulatory Visit (INDEPENDENT_AMBULATORY_CARE_PROVIDER_SITE_OTHER): Payer: Medicare HMO | Admitting: Cardiology

## 2017-03-11 ENCOUNTER — Encounter: Payer: Self-pay | Admitting: Cardiology

## 2017-03-11 VITALS — BP 130/78 | HR 60 | Ht 70.0 in | Wt 197.0 lb

## 2017-03-11 DIAGNOSIS — I10 Essential (primary) hypertension: Secondary | ICD-10-CM | POA: Diagnosis not present

## 2017-03-11 DIAGNOSIS — I2583 Coronary atherosclerosis due to lipid rich plaque: Secondary | ICD-10-CM

## 2017-03-11 DIAGNOSIS — F17200 Nicotine dependence, unspecified, uncomplicated: Secondary | ICD-10-CM

## 2017-03-11 DIAGNOSIS — E119 Type 2 diabetes mellitus without complications: Secondary | ICD-10-CM | POA: Diagnosis not present

## 2017-03-11 DIAGNOSIS — R0789 Other chest pain: Secondary | ICD-10-CM

## 2017-03-11 DIAGNOSIS — I2510 Atherosclerotic heart disease of native coronary artery without angina pectoris: Secondary | ICD-10-CM | POA: Diagnosis not present

## 2017-03-11 NOTE — Patient Instructions (Signed)
Medication Instructions:   Your physician recommends that you continue on your current medications as directed. Please refer to the Current Medication list given to you today.   If you need a refill on your cardiac medications before your next appointment, please call your pharmacy.  Labwork: NONE ORDERED  TODAY    Testing/Procedures: NONE ORDERED  TODAY    Follow-Up:  Your physician wants you to follow-up in:  [place] will receive a reminder letter in the mail two months in advance. If you don't receive a letter, please call our office to schedule the follow-up appointment.    Any Other Special Instructions Will Be Listed Below (If Applicable).   Coping with Quitting Smoking Quitting smoking is a physical and mental challenge. You will face cravings, withdrawal symptoms, and temptation. Before quitting, work with your health care provider to make a plan that can help you cope. Preparation can help you quit and keep you from giving in. How can I cope with cravings? Cravings usually last for 5-10 minutes. If you get through it, the craving will pass. Consider taking the following actions to help you cope with cravings:  Keep your mouth busy: ? Chew sugar-free gum. ? Suck on hard candies or a straw. ? Brush your teeth.  Keep your hands and body busy: ? Immediately change to a different activity when you feel a craving. ? Squeeze or play with a ball. ? Do an activity or a hobby, like making bead jewelry, practicing needlepoint, or working with wood. ? Mix up your normal routine. ? Take a short exercise break. Go for a quick walk or run up and down stairs. ? Spend time in public places where smoking is not allowed.  Focus on doing something kind or helpful for someone else.  Call a friend or family member to talk during a craving.  Join a support group.     Call a quit line, such as 1-800-QUIT-NOW.  Talk with your health care provider about  medicines that might help you cope with cravings and make quitting easier for you.  How can I deal with withdrawal symptoms? Your body may experience negative effects as it tries to get used to not having nicotine in the system. These effects are called withdrawal symptoms. They may include:  Feeling hungrier than normal.  Trouble concentrating.  Irritability.  Trouble sleeping.  Feeling depressed.  Restlessness and agitation.  Craving a cigarette.  To manage withdrawal symptoms:  Avoid places, people, and activities that trigger your cravings.  Remember why you want to quit.  Get plenty of sleep.  Avoid coffee and other caffeinated drinks. These may worsen some of your symptoms.  How can I handle social situations? Social situations can be difficult when you are quitting smoking, especially in the first few weeks. To manage this, you can:  Avoid parties, bars, and other social situations where people might be smoking.  Avoid alcohol.  Leave right away if you have the urge to smoke.  Explain to your family and friends that you are quitting smoking. Ask for understanding and support.  Plan activities with friends or family where smoking is not an option.  What are some ways I can cope with stress? Wanting to smoke may cause stress, and stress can make you want to smoke. Find ways to manage your stress. Relaxation techniques can help. For example:  Breathe slowly and deeply, in through your nose and out through your  mouth.  Listen to soothing, relaxing music.  Talk with a family member or friend about your stress.  Light a candle.  Soak in a bath or take a shower.  Think about a peaceful place.  What are some ways I can prevent weight gain? Be aware that many people gain weight after they quit smoking. However, not everyone does. To keep from gaining weight, have a plan in place before you quit and stick to the plan after you quit. Your plan should  include:  Having healthy snacks. When you have a craving, it may help to: ? Eat plain popcorn, crunchy carrots, celery, or other cut vegetables. ? Chew sugar-free gum.  Changing how you eat: ? Eat small portion sizes at meals. ? Eat 4-6 small meals throughout the day instead of 1-2 large meals a day. ? Be mindful when you eat. Do not watch television or do other things that might distract you as you eat.  Exercising regularly: ? Make time to exercise each day. If you do not have time for a long workout, do short bouts of exercise for 5-10 minutes several times a day. ? Do some form of strengthening exercise, like weight lifting, and some form of aerobic exercise, like running or swimming.  Drinking plenty of water or other low-calorie or no-calorie drinks. Drink 6-8 glasses of water daily, or as much as instructed by your health care provider.  Summary  Quitting smoking is a physical and mental challenge. You will face cravings, withdrawal symptoms, and temptation to smoke again. Preparation can help you as you go through these challenges.  You can cope with cravings by keeping your mouth busy (such as by chewing gum), keeping your body and hands busy, and making calls to family, friends, or a helpline for people who want to quit smoking.  You can cope with withdrawal symptoms by avoiding places where people smoke, avoiding drinks with caffeine, and getting plenty of rest.  Ask your health care provider about the different ways to prevent weight gain, avoid stress, and handle social situations. This information is not intended to replace advice given to you by your health care provider. Make sure you discuss any questions you have with your health care provider. Document Released: 09/11/2016 Document Revised: 09/11/2016 Document Reviewed: 09/11/2016 Elsevier Interactive Patient Education  Riley Jones.

## 2017-03-13 ENCOUNTER — Other Ambulatory Visit: Payer: Self-pay | Admitting: Internal Medicine

## 2017-03-28 ENCOUNTER — Other Ambulatory Visit: Payer: Self-pay | Admitting: Internal Medicine

## 2017-03-28 DIAGNOSIS — Z79891 Long term (current) use of opiate analgesic: Secondary | ICD-10-CM

## 2017-03-28 DIAGNOSIS — F411 Generalized anxiety disorder: Secondary | ICD-10-CM

## 2017-03-29 ENCOUNTER — Other Ambulatory Visit: Payer: Self-pay | Admitting: *Deleted

## 2017-03-29 DIAGNOSIS — F411 Generalized anxiety disorder: Secondary | ICD-10-CM

## 2017-03-29 DIAGNOSIS — Z79891 Long term (current) use of opiate analgesic: Secondary | ICD-10-CM

## 2017-03-29 NOTE — Telephone Encounter (Signed)
Will refill at appointment tomorrow

## 2017-03-30 ENCOUNTER — Encounter: Payer: Self-pay | Admitting: Internal Medicine

## 2017-03-30 ENCOUNTER — Ambulatory Visit (INDEPENDENT_AMBULATORY_CARE_PROVIDER_SITE_OTHER): Payer: Medicare HMO | Admitting: Internal Medicine

## 2017-03-30 VITALS — BP 141/67 | HR 75 | Temp 97.9°F | Ht 70.0 in | Wt 196.5 lb

## 2017-03-30 DIAGNOSIS — E118 Type 2 diabetes mellitus with unspecified complications: Secondary | ICD-10-CM

## 2017-03-30 DIAGNOSIS — E1169 Type 2 diabetes mellitus with other specified complication: Secondary | ICD-10-CM

## 2017-03-30 DIAGNOSIS — F32A Depression, unspecified: Secondary | ICD-10-CM

## 2017-03-30 DIAGNOSIS — Z7902 Long term (current) use of antithrombotics/antiplatelets: Secondary | ICD-10-CM

## 2017-03-30 DIAGNOSIS — I10 Essential (primary) hypertension: Secondary | ICD-10-CM

## 2017-03-30 DIAGNOSIS — I200 Unstable angina: Secondary | ICD-10-CM

## 2017-03-30 DIAGNOSIS — Z79891 Long term (current) use of opiate analgesic: Secondary | ICD-10-CM | POA: Diagnosis not present

## 2017-03-30 DIAGNOSIS — F411 Generalized anxiety disorder: Secondary | ICD-10-CM | POA: Diagnosis not present

## 2017-03-30 DIAGNOSIS — M069 Rheumatoid arthritis, unspecified: Secondary | ICD-10-CM | POA: Diagnosis not present

## 2017-03-30 DIAGNOSIS — Z794 Long term (current) use of insulin: Secondary | ICD-10-CM

## 2017-03-30 DIAGNOSIS — Z21 Asymptomatic human immunodeficiency virus [HIV] infection status: Secondary | ICD-10-CM | POA: Diagnosis not present

## 2017-03-30 DIAGNOSIS — Z0000 Encounter for general adult medical examination without abnormal findings: Secondary | ICD-10-CM

## 2017-03-30 DIAGNOSIS — Z79899 Other long term (current) drug therapy: Secondary | ICD-10-CM | POA: Diagnosis not present

## 2017-03-30 DIAGNOSIS — F329 Major depressive disorder, single episode, unspecified: Secondary | ICD-10-CM

## 2017-03-30 DIAGNOSIS — F17210 Nicotine dependence, cigarettes, uncomplicated: Secondary | ICD-10-CM

## 2017-03-30 DIAGNOSIS — B20 Human immunodeficiency virus [HIV] disease: Secondary | ICD-10-CM

## 2017-03-30 LAB — GLUCOSE, CAPILLARY: GLUCOSE-CAPILLARY: 310 mg/dL — AB (ref 65–99)

## 2017-03-30 MED ORDER — RANITIDINE HCL 150 MG PO TABS: 150.0000 mg | ORAL_TABLET | Freq: Two times a day (BID) | ORAL | 0 refills | Status: DC

## 2017-03-30 MED ORDER — INSULIN NPH (HUMAN) (ISOPHANE) 100 UNIT/ML ~~LOC~~ SUSP: SUBCUTANEOUS | 3 refills | Status: DC

## 2017-03-30 MED ORDER — ATORVASTATIN CALCIUM 40 MG PO TABS: 40.0000 mg | ORAL_TABLET | Freq: Every day | ORAL | 1 refills | Status: DC

## 2017-03-30 MED ORDER — METFORMIN HCL 1000 MG PO TABS: 1000.0000 mg | ORAL_TABLET | Freq: Two times a day (BID) | ORAL | 0 refills | Status: DC

## 2017-03-30 MED ORDER — LISINOPRIL-HYDROCHLOROTHIAZIDE 20-25 MG PO TABS: 1.0000 | ORAL_TABLET | Freq: Every day | ORAL | 1 refills | Status: DC

## 2017-03-30 MED ORDER — FOLIC ACID 1 MG PO TABS: 1.0000 mg | ORAL_TABLET | Freq: Every day | ORAL | 3 refills | Status: DC

## 2017-03-30 MED ORDER — EMPAGLIFLOZIN 10 MG PO TABS: 10.0000 mg | ORAL_TABLET | Freq: Every day | ORAL | 0 refills | Status: DC

## 2017-03-30 MED ORDER — HYDROCODONE-ACETAMINOPHEN 7.5-325 MG PO TABS: 1.0000 | ORAL_TABLET | Freq: Four times a day (QID) | ORAL | 0 refills | Status: DC | PRN

## 2017-03-30 MED ORDER — ALPRAZOLAM 0.5 MG PO TABS: 0.5000 mg | ORAL_TABLET | Freq: Every evening | ORAL | 0 refills | Status: DC | PRN

## 2017-03-30 MED ORDER — CARVEDILOL 3.125 MG PO TABS: 3.1250 mg | ORAL_TABLET | Freq: Two times a day (BID) | ORAL | 1 refills | Status: DC

## 2017-03-30 MED ORDER — TERAZOSIN HCL 2 MG PO CAPS: 2.0000 mg | ORAL_CAPSULE | Freq: Every day | ORAL | 1 refills | Status: DC

## 2017-03-30 MED ORDER — DULOXETINE HCL 60 MG PO CPEP: 60.0000 mg | ORAL_CAPSULE | Freq: Every day | ORAL | 0 refills | Status: DC

## 2017-03-30 NOTE — Assessment & Plan Note (Signed)
-   Patient has had no further episodes of chest pain - Was recently admitted for this complaint and had a stress test which did not show evidence of ischemia and had a 2-D echo which had a normal EF and mild LVH - It is possible that his chest pain is secondary to GERD - Cardiology follow-up appreciated - No further workup for now

## 2017-03-30 NOTE — Assessment & Plan Note (Signed)
-   We will refill his hydrocodone/acetaminophen today - We will check a UDS today - I reviewed the [place] narcotic database and no red flags were noted - I believe the benefits of using this medication outweigh the risks at this time as the pain medication enables him to do his daily routines and to participate in social activities which he would not otherwise be able to

## 2017-03-30 NOTE — Assessment & Plan Note (Signed)
-   Patient follow-up with rheumatologist and is on leflunomide and folic acid - He states his pain is better controlled but he is still requiring Norco for his joint pain - We'll continue to monitor for now

## 2017-03-30 NOTE — Assessment & Plan Note (Signed)
-   Patient is due for repeat colonoscopy this year - We'll refer patient to GI - Dr. Benson Norway for this procedure - We'll check hepatitis C antibody on his next visit

## 2017-03-30 NOTE — Assessment & Plan Note (Signed)
-   Patient states his anxiety is improving and he does not feel like he needs follow-up with a psychiatrist at this point - We will continue with alprazolam as needed but will try to taper this off over the next couple of months - Continue with duloxetine for now

## 2017-03-30 NOTE — Assessment & Plan Note (Signed)
Lab Results  Component Value Date   HGBA1C 10.0 (H) 02/26/2017   HGBA1C 9.6 12/01/2016   HGBA1C 9.6 06/30/2016     Assessment: Diabetes control:  poorly controlled Progress toward A1C goal:   deteriorated Comments: Patient is compliant with his insulin and metformin. He was unable to afford Januvia and was unable to tolerate victoza   Plan: Medications:  We'll start patient on empagliflozin and increase his nighttime insulin to 24 units Home glucose monitoring: Frequency:   Timing:   Instruction/counseling given: reminded to get eye exam, reminded to bring blood glucose meter & log to each visit, discussed foot care and discussed diet Educational resources provided: brochure (denies need ) Self management tools provided: copy of home glucose meter download Other plan: diabetic foot exam done today

## 2017-03-30 NOTE — Progress Notes (Signed)
   Subjective:    Patient ID: Riley Jones, male    DOB: 1956-07-06, 61 y.o.   MRN: [id number]  HPI  I have seen and examined this patient. He is here for routine follow-up of his diabetes and hypertension.  He was recently admitted for chest pain and had a stress test done which was low risk. He has followed up with cardiology and is on dual antiplatelet therapy. Denies any further episodes of chest pain at this time. Patient denies any new complaints at this time is compliant with all his medication. He is trying to change his diet and start exercising to bring down his blood sugars.  Review of Systems  Constitutional: Negative.   HENT: Negative.   Respiratory: Negative.   Cardiovascular: Negative.   Gastrointestinal: Negative.   Musculoskeletal: Negative.   Neurological: Negative.   Psychiatric/Behavioral: Negative.        Objective:   Physical Exam  Constitutional: He is oriented to person, place, and time. He appears well-developed and well-nourished.  HENT:  Head: Normocephalic and atraumatic.  Mouth/Throat: No oropharyngeal exudate.  Neck: Neck supple.  Cardiovascular: Normal rate, regular rhythm and normal heart sounds.   Pulmonary/Chest: Effort normal. No respiratory distress. He has no wheezes.  Abdominal: Soft. Bowel sounds are normal. He exhibits no distension. There is no tenderness.  Musculoskeletal: Normal range of motion. He exhibits no edema.  Lymphadenopathy:    He has no cervical adenopathy.  Neurological: He is alert and oriented to person, place, and time.  Skin: Skin is warm. No rash noted. No erythema.  Psychiatric: He has a normal mood and affect. His behavior is normal.          Assessment & Plan:  Please see problem based charting for assessment and plan:

## 2017-03-30 NOTE — Assessment & Plan Note (Signed)
BP Readings from Last 3 Encounters:  03/30/17 (!) 141/67  03/11/17 130/78  02/28/17 115/63    Lab Results  Component Value Date   NA 131 (L) 02/28/2017   K 3.6 02/28/2017   CREATININE 0.77 02/28/2017    Assessment: Blood pressure control:  fair Progress toward BP goal:   deteriorated Comments: Patient states he was unable to take his medications this morning as he was late coming to his appointment. He is on lisinopril 20 mg, HCTZ 25 mg and Terazosin 2 mg  Plan: Medications:  continue current medications Educational resources provided: brochure (denies need ) Self management tools provided:   Other plans: We'll check BMP on next visit. Have converted him to a combination pill of lisinopril and HCTZ

## 2017-03-30 NOTE — Patient Instructions (Signed)
-   It was a pleasure seeing you today - Please make an appointment with the eye doctor - You are due for a repeat colonoscopy - will refer you back to Dr. Benson Norway - I have refilled your medications. Please call me with any questions - I have started you on a new medication for your diabetes - empagliflozin. Please let me know if you have any issues obtaining this - I have also increased your night time insulin to 24 units.  - Please follow up in 3 months

## 2017-03-30 NOTE — Assessment & Plan Note (Signed)
-   He continues to do well on his current regimen - My great appreciation to Dr. Johnnye Sima for closely following him

## 2017-04-05 LAB — TOXASSURE SELECT,+ANTIDEPR,UR

## 2017-04-08 NOTE — Addendum Note (Signed)
Addended by: Hulan Fray on: 04/08/2017 01:22 PM   Modules accepted: Orders

## 2017-04-20 ENCOUNTER — Encounter: Payer: Self-pay | Admitting: Infectious Diseases

## 2017-04-23 ENCOUNTER — Other Ambulatory Visit: Payer: Self-pay | Admitting: Internal Medicine

## 2017-04-23 DIAGNOSIS — F411 Generalized anxiety disorder: Secondary | ICD-10-CM

## 2017-04-23 DIAGNOSIS — Z79891 Long term (current) use of opiate analgesic: Secondary | ICD-10-CM

## 2017-04-23 DIAGNOSIS — E118 Type 2 diabetes mellitus with unspecified complications: Secondary | ICD-10-CM

## 2017-04-26 ENCOUNTER — Other Ambulatory Visit: Payer: Self-pay | Admitting: *Deleted

## 2017-04-26 DIAGNOSIS — F411 Generalized anxiety disorder: Secondary | ICD-10-CM

## 2017-04-26 DIAGNOSIS — E118 Type 2 diabetes mellitus with unspecified complications: Secondary | ICD-10-CM

## 2017-04-26 DIAGNOSIS — Z79891 Long term (current) use of opiate analgesic: Secondary | ICD-10-CM

## 2017-04-27 ENCOUNTER — Other Ambulatory Visit: Payer: Self-pay | Admitting: Internal Medicine

## 2017-04-27 DIAGNOSIS — E118 Type 2 diabetes mellitus with unspecified complications: Secondary | ICD-10-CM

## 2017-04-28 MED ORDER — EMPAGLIFLOZIN 10 MG PO TABS: 10.0000 mg | ORAL_TABLET | Freq: Every day | ORAL | 0 refills | Status: DC

## 2017-04-28 MED ORDER — ALPRAZOLAM 0.5 MG PO TABS: 0.5000 mg | ORAL_TABLET | Freq: Every evening | ORAL | 0 refills | Status: DC | PRN

## 2017-04-28 MED ORDER — HYDROCODONE-ACETAMINOPHEN 7.5-325 MG PO TABS: 1.0000 | ORAL_TABLET | Freq: Four times a day (QID) | ORAL | 0 refills | Status: DC | PRN

## 2017-04-28 NOTE — Telephone Encounter (Signed)
Left mesg that rx (Norco & Xanax) ready for p/u.

## 2017-05-17 ENCOUNTER — Other Ambulatory Visit: Payer: Self-pay | Admitting: Infectious Diseases

## 2017-05-19 ENCOUNTER — Other Ambulatory Visit: Payer: Self-pay | Admitting: Internal Medicine

## 2017-05-19 DIAGNOSIS — E118 Type 2 diabetes mellitus with unspecified complications: Secondary | ICD-10-CM

## 2017-05-27 ENCOUNTER — Other Ambulatory Visit: Payer: Self-pay | Admitting: Internal Medicine

## 2017-05-27 DIAGNOSIS — Z79891 Long term (current) use of opiate analgesic: Secondary | ICD-10-CM

## 2017-05-27 DIAGNOSIS — F411 Generalized anxiety disorder: Secondary | ICD-10-CM

## 2017-05-27 MED ORDER — ALPRAZOLAM 0.5 MG PO TABS: 0.5000 mg | ORAL_TABLET | Freq: Every evening | ORAL | 0 refills | Status: DC | PRN

## 2017-05-27 MED ORDER — HYDROCODONE-ACETAMINOPHEN 7.5-325 MG PO TABS: 1.0000 | ORAL_TABLET | Freq: Four times a day (QID) | ORAL | 0 refills | Status: DC | PRN

## 2017-05-28 NOTE — Telephone Encounter (Signed)
Patient notified rx is ready for pick up

## 2017-06-15 ENCOUNTER — Other Ambulatory Visit: Payer: Self-pay | Admitting: Internal Medicine

## 2017-06-15 DIAGNOSIS — I10 Essential (primary) hypertension: Secondary | ICD-10-CM

## 2017-06-16 ENCOUNTER — Other Ambulatory Visit: Payer: Medicare HMO

## 2017-06-16 ENCOUNTER — Other Ambulatory Visit (HOSPITAL_COMMUNITY)
Admission: RE | Admit: 2017-06-16 | Discharge: 2017-06-16 | Disposition: A | Discharge status: Home / Self Care | Payer: Medicare HMO | Source: Ambulatory Visit | Attending: Infectious Diseases | Admitting: Infectious Diseases

## 2017-06-16 DIAGNOSIS — B20 Human immunodeficiency virus [HIV] disease: Secondary | ICD-10-CM | POA: Insufficient documentation

## 2017-06-16 DIAGNOSIS — Z113 Encounter for screening for infections with a predominantly sexual mode of transmission: Secondary | ICD-10-CM | POA: Insufficient documentation

## 2017-06-16 DIAGNOSIS — Z79899 Other long term (current) drug therapy: Secondary | ICD-10-CM | POA: Diagnosis not present

## 2017-06-17 LAB — URINE CYTOLOGY ANCILLARY ONLY
Chlamydia: NEGATIVE
Neisseria Gonorrhea: NEGATIVE

## 2017-06-17 LAB — COMPREHENSIVE METABOLIC PANEL
AG RATIO: 1.9 (calc) (ref 1.0–2.5)
ALBUMIN MSPROF: 4.3 g/dL (ref 3.6–5.1)
ALKALINE PHOSPHATASE (APISO): 83 U/L (ref 40–115)
ALT: 30 U/L (ref 9–46)
AST: 26 U/L (ref 10–35)
BILIRUBIN TOTAL: 0.5 mg/dL (ref 0.2–1.2)
BUN: 16 mg/dL (ref 7–25)
CALCIUM: 9.4 mg/dL (ref 8.6–10.3)
CHLORIDE: 100 mmol/L (ref 98–110)
CO2: 28 mmol/L (ref 20–32)
Creat: 1.01 mg/dL (ref 0.70–1.25)
GLOBULIN: 2.3 g/dL (ref 1.9–3.7)
Glucose, Bld: 226 mg/dL — ABNORMAL HIGH (ref 65–99)
Potassium: 4.2 mmol/L (ref 3.5–5.3)
SODIUM: 137 mmol/L (ref 135–146)
Total Protein: 6.6 g/dL (ref 6.1–8.1)

## 2017-06-17 LAB — CBC
HEMATOCRIT: 43.5 % (ref 38.5–50.0)
HEMOGLOBIN: 15.1 g/dL (ref 13.2–17.1)
MCH: 32.3 pg (ref 27.0–33.0)
MCHC: 34.7 g/dL (ref 32.0–36.0)
MCV: 92.9 fL (ref 80.0–100.0)
RBC: 4.68 10*6/uL (ref 4.20–5.80)
RDW: 12.6 % (ref 11.0–15.0)
WBC: 6.7 10*3/uL (ref 3.8–10.8)

## 2017-06-17 LAB — T-HELPER CELL (CD4) - (RCID CLINIC ONLY)
CD4 % Helper T Cell: 44 % (ref 33–55)
CD4 T Cell Abs: 720 /uL (ref 400–2700)

## 2017-06-17 LAB — LIPID PANEL
CHOLESTEROL: 150 mg/dL (ref <200)
HDL: 38 mg/dL — ABNORMAL LOW (ref >40)
LDL Cholesterol (Calc): 70 mg/dL (calc)
Non-HDL Cholesterol (Calc): 112 mg/dL (calc) (ref <130)
Total CHOL/HDL Ratio: 3.9 (calc) (ref <5.0)
Triglycerides: 345 mg/dL — ABNORMAL HIGH (ref <150)

## 2017-06-17 LAB — RPR: RPR: NONREACTIVE

## 2017-06-18 LAB — HIV-1 RNA QUANT-NO REFLEX-BLD
HIV 1 RNA QUANT: DETECTED {copies}/mL — AB
HIV-1 RNA QUANT, LOG: DETECTED {Log_copies}/mL — AB

## 2017-06-23 ENCOUNTER — Other Ambulatory Visit: Payer: Self-pay | Admitting: Internal Medicine

## 2017-06-30 ENCOUNTER — Other Ambulatory Visit: Payer: Self-pay | Admitting: Internal Medicine

## 2017-06-30 ENCOUNTER — Ambulatory Visit: Payer: Medicare HMO | Admitting: Infectious Diseases

## 2017-06-30 DIAGNOSIS — Z79891 Long term (current) use of opiate analgesic: Secondary | ICD-10-CM

## 2017-06-30 DIAGNOSIS — F411 Generalized anxiety disorder: Secondary | ICD-10-CM

## 2017-07-01 MED ORDER — HYDROCODONE-ACETAMINOPHEN 7.5-325 MG PO TABS: 1.0000 | ORAL_TABLET | Freq: Four times a day (QID) | ORAL | 0 refills | Status: DC | PRN

## 2017-07-01 MED ORDER — ALPRAZOLAM 0.5 MG PO TABS: 0.5000 mg | ORAL_TABLET | Freq: Every evening | ORAL | 0 refills | Status: DC | PRN

## 2017-07-06 ENCOUNTER — Ambulatory Visit (INDEPENDENT_AMBULATORY_CARE_PROVIDER_SITE_OTHER): Payer: Medicare HMO | Admitting: Internal Medicine

## 2017-07-06 ENCOUNTER — Encounter: Payer: Self-pay | Admitting: Internal Medicine

## 2017-07-06 VITALS — BP 129/67 | HR 61 | Temp 97.6°F | Ht 70.0 in | Wt 193.1 lb

## 2017-07-06 DIAGNOSIS — F329 Major depressive disorder, single episode, unspecified: Secondary | ICD-10-CM

## 2017-07-06 DIAGNOSIS — E118 Type 2 diabetes mellitus with unspecified complications: Secondary | ICD-10-CM

## 2017-07-06 DIAGNOSIS — Z79899 Other long term (current) drug therapy: Secondary | ICD-10-CM | POA: Diagnosis not present

## 2017-07-06 DIAGNOSIS — G4700 Insomnia, unspecified: Secondary | ICD-10-CM

## 2017-07-06 DIAGNOSIS — F411 Generalized anxiety disorder: Secondary | ICD-10-CM | POA: Diagnosis not present

## 2017-07-06 DIAGNOSIS — Z7984 Long term (current) use of oral hypoglycemic drugs: Secondary | ICD-10-CM

## 2017-07-06 DIAGNOSIS — F32A Depression, unspecified: Secondary | ICD-10-CM

## 2017-07-06 DIAGNOSIS — F17210 Nicotine dependence, cigarettes, uncomplicated: Secondary | ICD-10-CM | POA: Diagnosis not present

## 2017-07-06 DIAGNOSIS — B20 Human immunodeficiency virus [HIV] disease: Secondary | ICD-10-CM | POA: Diagnosis not present

## 2017-07-06 DIAGNOSIS — Z79891 Long term (current) use of opiate analgesic: Secondary | ICD-10-CM | POA: Diagnosis not present

## 2017-07-06 DIAGNOSIS — Z0000 Encounter for general adult medical examination without abnormal findings: Secondary | ICD-10-CM | POA: Diagnosis not present

## 2017-07-06 DIAGNOSIS — I10 Essential (primary) hypertension: Secondary | ICD-10-CM

## 2017-07-06 DIAGNOSIS — M069 Rheumatoid arthritis, unspecified: Secondary | ICD-10-CM

## 2017-07-06 DIAGNOSIS — F17219 Nicotine dependence, cigarettes, with unspecified nicotine-induced disorders: Secondary | ICD-10-CM

## 2017-07-06 DIAGNOSIS — Z21 Asymptomatic human immunodeficiency virus [HIV] infection status: Secondary | ICD-10-CM

## 2017-07-06 DIAGNOSIS — I200 Unstable angina: Secondary | ICD-10-CM

## 2017-07-06 LAB — GLUCOSE, CAPILLARY: Glucose-Capillary: 156 mg/dL — ABNORMAL HIGH (ref 65–99)

## 2017-07-06 LAB — POCT GLYCOSYLATED HEMOGLOBIN (HGB A1C): Hemoglobin A1C: 6.6

## 2017-07-06 NOTE — Assessment & Plan Note (Signed)
-   Patient states he has issues sleeping and has been using his xanax intermittently to sleep at night - I explained that this is not the best medication for his insomnia and was to be used only prn for anxiety - I advised OTC melatonin - We will discuss sleep hygiene on his next vist

## 2017-07-06 NOTE — Assessment & Plan Note (Signed)
-   This problem is chronic and stable - C/w cymbalta for now

## 2017-07-06 NOTE — Assessment & Plan Note (Signed)
-   This problem is chronic and stable - Will continue with leflunomide - Patient will follow up with rheumatology as an outpatient

## 2017-07-06 NOTE — Assessment & Plan Note (Signed)
-   Patient has chronic pain secondary to his RA - Refilled his hydrocodone/acetaminophen - I believe the benefits of using this medication outweigh the risks at this time and this medication enables him to do his daily activities and participate in social activities. Will continue his medication for now

## 2017-07-06 NOTE — Assessment & Plan Note (Signed)
-   Patient advised to stop smoking - He is interested in quitting and currently smoking 1 PPD - He was prescribed zyban earlier this year and states he has a box at home and will try this - He will call me with any issues - I asked him to start the medication and set a quit date 1 week after starting

## 2017-07-06 NOTE — Assessment & Plan Note (Signed)
-   Patient with well controlled HIV - His CD4 count is in the 700s and his viral load is undetectable - My great appreciation to Dr. Johnnye Sima for his help in the care of this patient - Patient to follow up with ID next month

## 2017-07-06 NOTE — Assessment & Plan Note (Signed)
-   Patient states his anxiety is better controlled - Will decrease his xanax to 20 pills/month from his next prescription - Patient on cymbalta and to start bupropion for smoking cessation and I believe this will help his anxiety and will tapering him off his xanax slowly

## 2017-07-06 NOTE — Assessment & Plan Note (Signed)
-   Patient obtained flu shot at work - He is due for repeat colonoscopy with Dr. Benson Norway - Will refer to GI today for follow up

## 2017-07-06 NOTE — Assessment & Plan Note (Signed)
BP Readings from Last 3 Encounters:  07/06/17 129/67  03/30/17 (!) 141/67  03/11/17 130/78    Lab Results  Component Value Date   NA 137 06/16/2017   K 4.2 06/16/2017   CREATININE 1.01 06/16/2017    Assessment: Blood pressure control:  well controlled Progress toward BP goal:   at goal Comments: continue with terazosin 2 mg, lisinopril/HCTZ 20/25 mg, coreg 3.125 mg bid  Plan: Medications:  continue current medications Educational resources provided: brochure (denies need ) Self management tools provided:   Other plans:

## 2017-07-06 NOTE — Assessment & Plan Note (Signed)
Lab Results  Component Value Date   HGBA1C 6.6 07/06/2017   HGBA1C 10.0 (H) 02/26/2017   HGBA1C 9.6 12/01/2016     Assessment: Diabetes control:  well controlled Progress toward A1C goal:   at goal Comments: continue with metformin 1000 mg bid, NPH insulin 17 units AM/24 units PM, jardiance 10 mg. Blood sugars with an average of 153 with a high of 249 and lowest of 97.   Plan: Medications:  continue current medications Home glucose monitoring: Frequency:   Timing:   Instruction/counseling given: reminded to get eye exam, reminded to bring blood glucose meter & log to each visit, discussed foot care and discussed the need for weight loss Educational resources provided: brochure (denies need ) Self management tools provided: copy of home glucose meter download Other plans:

## 2017-07-06 NOTE — Patient Instructions (Addendum)
-   It was a pleasure seeing you today - Your A1C has improved to 6.6. Keep up the great work! - We will do a foot exam today - Please follow up with your rheumatologist for your RA - Please schedule your eye exam f/u - We will refer you to Dr. Benson Norway for a colonoscopy - Please try and stop smoking.  - Please call me with any questions

## 2017-07-06 NOTE — Assessment & Plan Note (Signed)
-   Patient denies any recurrent episodes of chest pain - He had a cardiac work up including an ECHO with no acute abnormalities - Patient to follow up with cardiology as an outpatient - He still smokes 1 PPD. Advised to quit smoking - No further work up for now

## 2017-07-06 NOTE — Progress Notes (Signed)
   Subjective:    Patient ID: Riley Jones, male    DOB: [birth year], 61 y.o.   MRN: [id number]  HPI  I have seen and examined this patient. He is here for routine follow up of his HTN and DM.   Patient states he has improved his diet and been compliant with his diabetic meds. States his blood sugars have improved. He does complain of diarrhea over the last month. States he has intermittent diarrhea which is watery. No fevers, no hematochezia, no abd pain, no lightheadedness, no syncope. States his diarrhea improves with immodium. States it started 2 months after he was started on jardiance but thinks it is likely secondary to stress.  He is compliant with his medications and has no other complaints at this time.   Review of Systems  Constitutional: Negative.   HENT: Negative.   Respiratory: Negative.   Cardiovascular: Negative.   Gastrointestinal: Positive for diarrhea. Negative for abdominal pain, blood in stool, nausea and vomiting.  Musculoskeletal: Negative.   Skin: Negative.   Neurological: Negative.   Psychiatric/Behavioral: Negative.        Objective:   Physical Exam  Constitutional: He is oriented to person, place, and time. He appears well-developed and well-nourished.  HENT:  Head: Normocephalic and atraumatic.  Cardiovascular: Normal rate, regular rhythm and normal heart sounds.   Pulmonary/Chest: Effort normal and breath sounds normal. No respiratory distress. He has no wheezes.  Abdominal: Soft. Bowel sounds are normal. He exhibits no distension. There is no tenderness.  Musculoskeletal: Normal range of motion. He exhibits no edema.  Neurological: He is alert and oriented to person, place, and time.  Skin: Skin is warm. No erythema.  Psychiatric: He has a normal mood and affect. His behavior is normal.          Assessment & Plan:  Please see problem based charting for assessment and plan:

## 2017-07-22 ENCOUNTER — Other Ambulatory Visit: Payer: Self-pay | Admitting: Internal Medicine

## 2017-07-22 DIAGNOSIS — E118 Type 2 diabetes mellitus with unspecified complications: Secondary | ICD-10-CM

## 2017-08-02 ENCOUNTER — Other Ambulatory Visit: Payer: Self-pay | Admitting: Pharmacist

## 2017-08-02 ENCOUNTER — Other Ambulatory Visit: Payer: Self-pay | Admitting: Internal Medicine

## 2017-08-02 ENCOUNTER — Ambulatory Visit: Payer: Medicare HMO | Admitting: Infectious Diseases

## 2017-08-02 VITALS — Wt 188.0 lb

## 2017-08-02 DIAGNOSIS — Z113 Encounter for screening for infections with a predominantly sexual mode of transmission: Secondary | ICD-10-CM | POA: Diagnosis not present

## 2017-08-02 DIAGNOSIS — E118 Type 2 diabetes mellitus with unspecified complications: Secondary | ICD-10-CM

## 2017-08-02 DIAGNOSIS — B20 Human immunodeficiency virus [HIV] disease: Secondary | ICD-10-CM

## 2017-08-02 DIAGNOSIS — F329 Major depressive disorder, single episode, unspecified: Secondary | ICD-10-CM | POA: Diagnosis not present

## 2017-08-02 DIAGNOSIS — J42 Unspecified chronic bronchitis: Secondary | ICD-10-CM | POA: Diagnosis not present

## 2017-08-02 DIAGNOSIS — Z79899 Other long term (current) drug therapy: Secondary | ICD-10-CM | POA: Diagnosis not present

## 2017-08-02 DIAGNOSIS — Z79891 Long term (current) use of opiate analgesic: Secondary | ICD-10-CM

## 2017-08-02 DIAGNOSIS — F32A Depression, unspecified: Secondary | ICD-10-CM

## 2017-08-02 DIAGNOSIS — F411 Generalized anxiety disorder: Secondary | ICD-10-CM

## 2017-08-02 DIAGNOSIS — F17219 Nicotine dependence, cigarettes, with unspecified nicotine-induced disorders: Secondary | ICD-10-CM

## 2017-08-02 DIAGNOSIS — N529 Male erectile dysfunction, unspecified: Secondary | ICD-10-CM

## 2017-08-02 MED ORDER — BICTEGRAVIR-EMTRICITAB-TENOFOV 50-200-25 MG PO TABS: 1.0000 | ORAL_TABLET | Freq: Every day | ORAL | 3 refills | Status: DC

## 2017-08-02 MED ORDER — SILDENAFIL CITRATE 50 MG PO TABS: 50.0000 mg | ORAL_TABLET | Freq: Every day | ORAL | 5 refills | Status: DC | PRN

## 2017-08-02 MED ORDER — CLOTRIMAZOLE-BETAMETHASONE 1-0.05 % EX CREA: 1.0000 "application " | TOPICAL_CREAM | Freq: Two times a day (BID) | CUTANEOUS | 0 refills | Status: DC

## 2017-08-02 NOTE — Assessment & Plan Note (Signed)
I encouraged him multiple times to quit smoking.

## 2017-08-02 NOTE — Assessment & Plan Note (Signed)
He seems to be doing ok Appreciate PCP f/u.

## 2017-08-02 NOTE — Progress Notes (Signed)
   Subjective:    Patient ID: Riley Jones, male    DOB: 11/15/1955, 61 y.o.   MRN: [id number]  HPI 61 yo M with hx of HIV+ (~77yrs), DM2 (>67yrs, on insulin) and RA (took MTX without relief, off prednisone), COPD. He has been maintained on atripla (has been on multiple previous meds).  Also hx of smoking. CAD s/p DES to mid LAD in 06/2014, his EF 55-65% at that time. He had a myoview 11-2014- low risk.  Was in [place] 02-26-17 for unstable angina. He had a nuclear stress that was low risk. He was felt to continue to have angina. Trop peak 0.04.  He continues to smoke.  His A1C has 63-.9 --> 6.6%.  Wt is down, has cut back portions.   HIV 1 RNA Quant (copies/mL)  Date Value  06/16/2017 <20 DETECTED (A)  10/29/2016 <20 NOT DETECTED  03/02/2016 <20   CD4 T Cell Abs (/uL)  Date Value  06/16/2017 720  10/29/2016 870  03/02/2016 840    Review of Systems  Constitutional: Negative for appetite change, chills, fever and unexpected weight change.  Respiratory: Positive for cough. Negative for shortness of breath.   Cardiovascular: Negative for chest pain and leg swelling.  Gastrointestinal: Positive for diarrhea. Negative for constipation.  Genitourinary: Negative for difficulty urinating.  Psychiatric/Behavioral: Positive for sleep disturbance.  takes prn imodium.  Is going to call for colonoscopy.  Had screening CT scan 10-3016, repeat yearly.  Takes prn xanax to help sleep, give him weird dreams.  Please see HPI. All other systems reviewed and negative.     Objective:   Physical Exam  Constitutional: He appears well-developed and well-nourished.  HENT:  Mouth/Throat: No oropharyngeal exudate.  Eyes: EOM are normal. Pupils are equal, round, and reactive to light.  Neck: Neck supple.  Cardiovascular: Normal rate, regular rhythm and normal heart sounds.  Pulses:      Posterior tibial pulses are 3+ on the right side, and 3+ on the left side.  Pulmonary/Chest: Effort normal and  breath sounds normal.  Abdominal: Soft. Bowel sounds are normal. There is no tenderness. There is no rebound.  Musculoskeletal: He exhibits no edema.  Lymphadenopathy:    He has no cervical adenopathy.  Psychiatric: He has a normal mood and affect.   No diabetic foot lesions, normal light touch.      Assessment & Plan:

## 2017-08-02 NOTE — Progress Notes (Signed)
HPI: Riley Jones is a 61 y.o. male who presents to the [facility] clinic today to follow-up with Dr. Johnnye Jones for his HIV infection.  Allergies: Allergies  Allergen Reactions  . Penicillins Hives, Nausea Only and Rash    Has patient had a PCN reaction causing immediate rash, facial/tongue/throat swelling, SOB or lightheadedness with hypotension: Yes Has patient had a PCN reaction causing severe rash involving mucus membranes or skin necrosis: No Has patient had a PCN reaction that required hospitalization: No Has patient had a PCN reaction occurring within the last 10 years: No If all of the above answers are "NO", then may proceed with Cephalosporin use.   Marland Kitchen Zoloft [Sertraline Hcl] Other (See Comments)    Patient reported Psychomotor slowing / worsened depression  . Chantix [Varenicline] Hives    Past Medical History: Past Medical History:  Diagnosis Date  . Anxiety   . Arthritis    "pretty much all over"  . Asthma    "mild" (07/17/2014)  . Atypical angina ([place])    Myoview 2003, Normal EF 64%  . CAD (coronary artery disease)    a. 10/20 DES to 80% mid LAD  . COPD (chronic obstructive pulmonary disease) ([place])    "mild" (07/17/2014)  . Depression   . Foot pain 03/30/2011  . GERD (gastroesophageal reflux disease)   . H/O hiatal hernia   . Headache    "weekly" (06/2014)  . History of gout   . HIV (human immunodeficiency virus infection) ([place]) 1995   DX after shingles followed by Dr. Ola Jones ([place])  . Hx of cardiovascular stress test    ETT-Myoview (3/16):  Ex 10:15, No Ischemia, EF 57%; Normal Study  . Hyperlipidemia   . Hypertension    Well Controlled off meds  . Kidney stones    "passed them all"  . Pneumonia 4-5 times  . Rheumatoid arthritis(714.0)    Followed by Dr. Charlestine Jones, off MTX and prdnisone since 2007  . Sleep apnea    does not wear mask (07/17/2014)  . Type II diabetes mellitus ([place])     Social History: Social History   Socioeconomic History  . Marital  status: Single    Spouse name: Not on file  . Number of children: Not on file  . Years of education: 98  . Highest education level: Not on file  Social Needs  . Financial resource strain: Not on file  . Food insecurity - worry: Not on file  . Food insecurity - inability: Not on file  . Transportation needs - medical: Not on file  . Transportation needs - non-medical: Not on file  Occupational History  . Occupation: [employer]  . Occupation: Truck Geophysicist/field seismologist (retired)  Tobacco Use  . Smoking status: Current Every Day Smoker    Packs/day: 1.00    Years: 46.00    Pack years: 46.00    Types: Cigarettes  . Smokeless tobacco: Never Used  . Tobacco comment: Thinking about  Substance and Sexual Activity  . Alcohol use: Yes    Alcohol/week: 0.6 oz    Types: 1 Glasses of wine per week    Comment: 07/17/2014 "glass of wine once or twice/month"  . Drug use: No  . Sexual activity: Not Currently    Partners: Male    Comment: given condoms  Other Topics Concern  . Not on file  Social History Narrative   [place] approved beginning 12/11/2009 - 12/27/2010   Riley Jones benefits approved; patient eligible for 100% discount for out patient labs  and office visits. Patient eligible for 70% discount for other services per Riley Jones 09/08/2010      Patient is on disability, and walks 2-3 times per week.     Current Regimen: Atripla  Labs: HIV 1 RNA Quant (copies/mL)  Date Value  06/16/2017 <20 DETECTED (A)  10/29/2016 <20 NOT DETECTED  03/02/2016 <20   CD4 T Cell Abs (/uL)  Date Value  06/16/2017 720  10/29/2016 870  03/02/2016 840   Hep B S Ab (no units)  Date Value  11/22/2006 YES   Hepatitis B Surface Ag (no units)  Date Value  11/22/2006 NO   HCV Ab (no units)  Date Value  11/22/2006 NO    CrCl: CrCl cannot be calculated (Patient's most recent lab result is older than the maximum 21 days allowed.).  Lipids:    Component Value Date/Time   CHOL 150 06/16/2017 0959   TRIG  345 (H) 06/16/2017 0959   HDL 38 (L) 06/16/2017 0959   CHOLHDL 3.9 06/16/2017 0959   VLDL UNABLE TO CALCULATE IF TRIGLYCERIDE OVER 400 mg/dL 02/26/2017 2018   LDLCALC UNABLE TO CALCULATE IF TRIGLYCERIDE OVER 400 mg/dL 02/26/2017 2018    Assessment: Riley Jones is here today to follow-up with Dr. Johnnye Jones for his HIV infection. He has been on Atripla for years with excellent compliance. Dr. Johnnye Jones would like to change him over to [facility] today.  I spent time going over [place] and explaining the medication to him.  He usually takes his Atripla at [facility] before bedtime and will continue taking Biktarvy at that time as well.   He is asking what medication he can be on for erectile dysfunction.  He does have some unstable/stable angina but has only taken 1 nitrate in the past 3 years.  Will see which ED medication his insurance covers and will send in to [facility] for him (Dr. Johnnye Jones approved). He will come back and see me for labs and to make sure he is tolerating Biktarvy ok after the holidays.   Plans: - Stop Atripla - Start Biktarvy PO once daily - See what ED medication [employer] Medicare covers - F/u with me 10/05/17 at [facility]. Shanyce Daris, PharmD, [facility] 08/02/2017, 11:04 AM

## 2017-08-02 NOTE — Addendum Note (Signed)
Addended by: Magda Kiel L on: 08/02/2017 11:47 AM   Modules accepted: Orders

## 2017-08-02 NOTE — Assessment & Plan Note (Signed)
He seems well.  He is given sherrie's card.  Will have Amy say hello

## 2017-08-02 NOTE — Assessment & Plan Note (Signed)
He is doing very well on atripla, offered modern rx.  Will change him to biktarvy to protect his kidneys. Offered/refused condoms.  Has gotten flu shot.  Will see back in 6 months.

## 2017-08-02 NOTE — Assessment & Plan Note (Signed)
By his report he is doing much better.

## 2017-08-03 MED ORDER — HYDROCODONE-ACETAMINOPHEN 7.5-325 MG PO TABS: 1.0000 | ORAL_TABLET | Freq: Four times a day (QID) | ORAL | 0 refills | Status: DC | PRN

## 2017-08-03 MED ORDER — ALPRAZOLAM 0.5 MG PO TABS: 0.5000 mg | ORAL_TABLET | Freq: Every evening | ORAL | 0 refills | Status: DC | PRN

## 2017-08-03 NOTE — Telephone Encounter (Signed)
Call made to patient-states he did not receive rx during last visit.  Rx ready and he will pick up tomorrow.Regenia Skeeter, Shaundra Fullam Cassady11/6/20182:20 PM

## 2017-08-20 ENCOUNTER — Other Ambulatory Visit: Payer: Self-pay | Admitting: Internal Medicine

## 2017-08-22 ENCOUNTER — Other Ambulatory Visit: Payer: Self-pay | Admitting: Internal Medicine

## 2017-08-24 ENCOUNTER — Other Ambulatory Visit: Payer: Self-pay | Admitting: Internal Medicine

## 2017-08-24 DIAGNOSIS — I10 Essential (primary) hypertension: Secondary | ICD-10-CM

## 2017-08-24 MED ORDER — FLUTICASONE PROPIONATE 50 MCG/ACT NA SUSP: 1.0000 | Freq: Every day | NASAL | 0 refills | Status: DC

## 2017-08-24 NOTE — Telephone Encounter (Signed)
Glad we could help -- thank YOU!

## 2017-08-24 NOTE — Telephone Encounter (Signed)
I have refilled it. I saw an old note from Dr. Johnnye Sima saying it may interact so I wasn't sure if we should refill it. Thank you for your help

## 2017-08-28 ENCOUNTER — Other Ambulatory Visit: Payer: Self-pay | Admitting: Internal Medicine

## 2017-08-28 DIAGNOSIS — Z79891 Long term (current) use of opiate analgesic: Secondary | ICD-10-CM

## 2017-08-28 DIAGNOSIS — F411 Generalized anxiety disorder: Secondary | ICD-10-CM

## 2017-08-28 DIAGNOSIS — E118 Type 2 diabetes mellitus with unspecified complications: Secondary | ICD-10-CM

## 2017-08-30 MED ORDER — ALPRAZOLAM 0.5 MG PO TABS: 0.5000 mg | ORAL_TABLET | Freq: Every evening | ORAL | 0 refills | Status: DC | PRN

## 2017-08-30 MED ORDER — HYDROCODONE-ACETAMINOPHEN 7.5-325 MG PO TABS: 1.0000 | ORAL_TABLET | Freq: Four times a day (QID) | ORAL | 0 refills | Status: DC | PRN

## 2017-08-30 MED ORDER — METFORMIN HCL 1000 MG PO TABS: 1000.0000 mg | ORAL_TABLET | Freq: Two times a day (BID) | ORAL | 0 refills | Status: DC

## 2017-08-30 NOTE — Addendum Note (Signed)
Addended by: Truddie Crumble on: 08/30/2017 09:39 AM   Modules accepted: Orders

## 2017-09-03 ENCOUNTER — Other Ambulatory Visit: Payer: Self-pay | Admitting: Internal Medicine

## 2017-09-03 DIAGNOSIS — F411 Generalized anxiety disorder: Secondary | ICD-10-CM

## 2017-09-03 DIAGNOSIS — Z79891 Long term (current) use of opiate analgesic: Secondary | ICD-10-CM

## 2017-09-05 MED ORDER — ALPRAZOLAM 0.5 MG PO TABS: 0.5000 mg | ORAL_TABLET | Freq: Every evening | ORAL | 0 refills | Status: DC | PRN

## 2017-09-05 MED ORDER — HYDROCODONE-ACETAMINOPHEN 7.5-325 MG PO TABS: 1.0000 | ORAL_TABLET | Freq: Four times a day (QID) | ORAL | 0 refills | Status: DC | PRN

## 2017-09-05 NOTE — Telephone Encounter (Signed)
Refilled 1 month Rx

## 2017-09-08 ENCOUNTER — Other Ambulatory Visit: Payer: Self-pay | Admitting: Internal Medicine

## 2017-09-08 DIAGNOSIS — F329 Major depressive disorder, single episode, unspecified: Secondary | ICD-10-CM

## 2017-09-08 DIAGNOSIS — F32A Depression, unspecified: Secondary | ICD-10-CM

## 2017-10-02 ENCOUNTER — Other Ambulatory Visit: Payer: Self-pay | Admitting: Internal Medicine

## 2017-10-02 DIAGNOSIS — Z79891 Long term (current) use of opiate analgesic: Secondary | ICD-10-CM

## 2017-10-02 DIAGNOSIS — F411 Generalized anxiety disorder: Secondary | ICD-10-CM

## 2017-10-04 MED ORDER — ALPRAZOLAM 0.5 MG PO TABS: 0.5000 mg | ORAL_TABLET | Freq: Every evening | ORAL | 0 refills | Status: DC | PRN

## 2017-10-04 MED ORDER — HYDROCODONE-ACETAMINOPHEN 7.5-325 MG PO TABS: 1.0000 | ORAL_TABLET | Freq: Four times a day (QID) | ORAL | 0 refills | Status: DC | PRN

## 2017-10-05 ENCOUNTER — Ambulatory Visit: Payer: Medicare HMO

## 2017-10-13 ENCOUNTER — Other Ambulatory Visit: Payer: Self-pay | Admitting: Internal Medicine

## 2017-10-13 DIAGNOSIS — I10 Essential (primary) hypertension: Secondary | ICD-10-CM

## 2017-10-27 ENCOUNTER — Telehealth: Payer: Self-pay | Admitting: *Deleted

## 2017-10-27 ENCOUNTER — Other Ambulatory Visit: Payer: Self-pay | Admitting: Internal Medicine

## 2017-10-27 DIAGNOSIS — I2510 Atherosclerotic heart disease of native coronary artery without angina pectoris: Secondary | ICD-10-CM | POA: Diagnosis not present

## 2017-10-27 DIAGNOSIS — Z86010 Personal history of colonic polyps: Secondary | ICD-10-CM | POA: Diagnosis not present

## 2017-10-27 DIAGNOSIS — E119 Type 2 diabetes mellitus without complications: Secondary | ICD-10-CM | POA: Diagnosis not present

## 2017-10-27 DIAGNOSIS — I10 Essential (primary) hypertension: Secondary | ICD-10-CM

## 2017-10-27 DIAGNOSIS — K5730 Diverticulosis of large intestine without perforation or abscess without bleeding: Secondary | ICD-10-CM | POA: Diagnosis not present

## 2017-10-27 NOTE — Telephone Encounter (Signed)
   [facility] Pre-operative Risk Assessment    Request for surgical clearance:  1. What type of surgery is being performed? Colonoscopy   2. When is this surgery scheduled? 11/16/17  3. What type of clearance is required (medical clearance vs. Pharmacy clearance to hold med vs. Both)? Both  4. Are there any medications that need to be held prior to surgery and how long? Plavix and Aspirin  5. Practice name and name of physician performing surgery? [facility] Dr. Carol Ada  6. What is your office phone and fax number? P# [phone], F#[phone]  7. Anesthesia type (None, local, MAC, general) ? Propofol Pending Clearance from Cardiology   Riley Jones 10/27/2017, 1:10 PM  _________________________________________________________________   (provider comments below)

## 2017-10-28 ENCOUNTER — Other Ambulatory Visit: Payer: Self-pay | Admitting: Internal Medicine

## 2017-10-28 DIAGNOSIS — I10 Essential (primary) hypertension: Secondary | ICD-10-CM

## 2017-10-28 NOTE — Telephone Encounter (Signed)
Left message for pt to call back - hx of stent 2015, on plavix and ASA, will send message to Dr. Tamala Julian as well.    Dr. Tamala Julian can pt be off ASSA and plavix for 5 days for colonoscopy?

## 2017-10-28 NOTE — Telephone Encounter (Signed)
   Primary Cardiologist: Dr. Johnsie Cancel Chart reviewed as part of pre-operative protocol coverage. Given past medical history and time since last visit, based on ACC/AHA guidelines, HELIOS KOHLMANN would be at acceptable risk for the planned procedure without further cardiovascular testing. He cqan meet 4 METS without chest pain. Need to know from Dr. Johnsie Cancel if pt can be off ASA and plavix for 5 days.   Once above complete will need to be sent to GI.  Please call with questions.  Cecilie Kicks, NP 10/28/2017, 3:35 PM

## 2017-10-28 NOTE — Telephone Encounter (Signed)
F/U Call: ° °Patient returning call °

## 2017-10-28 NOTE — Telephone Encounter (Signed)
Ok to hold ASA and Plavix 5 days prior to procedure.

## 2017-10-28 NOTE — Telephone Encounter (Signed)
Yes his stent was in 2015

## 2017-10-28 NOTE — Telephone Encounter (Signed)
Called to let pt know that he has been cleared from Dr. Johnsie Cancel to hold his ASA & Plavix for 5 days prior to his procedure.  Pt thanked me for the call.

## 2017-10-29 ENCOUNTER — Other Ambulatory Visit: Payer: Self-pay | Admitting: Internal Medicine

## 2017-10-29 DIAGNOSIS — Z79891 Long term (current) use of opiate analgesic: Secondary | ICD-10-CM

## 2017-10-29 DIAGNOSIS — F411 Generalized anxiety disorder: Secondary | ICD-10-CM

## 2017-10-29 MED ORDER — HYDROCODONE-ACETAMINOPHEN 7.5-325 MG PO TABS: 1.0000 | ORAL_TABLET | Freq: Four times a day (QID) | ORAL | 0 refills | Status: DC | PRN

## 2017-10-29 MED ORDER — ALPRAZOLAM 0.5 MG PO TABS: 0.5000 mg | ORAL_TABLET | Freq: Every evening | ORAL | 0 refills | Status: DC | PRN

## 2017-11-16 DIAGNOSIS — Z86010 Personal history of colonic polyps: Secondary | ICD-10-CM | POA: Diagnosis not present

## 2017-11-16 DIAGNOSIS — K5730 Diverticulosis of large intestine without perforation or abscess without bleeding: Secondary | ICD-10-CM | POA: Diagnosis not present

## 2017-11-22 ENCOUNTER — Other Ambulatory Visit: Payer: Self-pay | Admitting: Internal Medicine

## 2017-11-22 DIAGNOSIS — E118 Type 2 diabetes mellitus with unspecified complications: Secondary | ICD-10-CM

## 2017-11-26 ENCOUNTER — Other Ambulatory Visit: Payer: Self-pay | Admitting: Internal Medicine

## 2017-11-26 DIAGNOSIS — F411 Generalized anxiety disorder: Secondary | ICD-10-CM

## 2017-11-26 DIAGNOSIS — Z79891 Long term (current) use of opiate analgesic: Secondary | ICD-10-CM

## 2017-11-26 NOTE — Telephone Encounter (Signed)
Dr Dareen Piano back on the 4th. Does he have enough to last till then?

## 2017-11-29 MED ORDER — HYDROCODONE-ACETAMINOPHEN 7.5-325 MG PO TABS: 1.0000 | ORAL_TABLET | Freq: Four times a day (QID) | ORAL | 0 refills | Status: DC | PRN

## 2017-11-29 MED ORDER — ALPRAZOLAM 0.5 MG PO TABS: 0.5000 mg | ORAL_TABLET | Freq: Every evening | ORAL | 0 refills | Status: DC | PRN

## 2017-12-01 ENCOUNTER — Encounter: Payer: Self-pay | Admitting: Infectious Diseases

## 2017-12-07 ENCOUNTER — Other Ambulatory Visit: Payer: Self-pay | Admitting: Cardiovascular Disease

## 2017-12-17 ENCOUNTER — Other Ambulatory Visit: Payer: Self-pay | Admitting: Internal Medicine

## 2017-12-31 ENCOUNTER — Telehealth: Payer: Self-pay | Admitting: Internal Medicine

## 2017-12-31 DIAGNOSIS — F411 Generalized anxiety disorder: Secondary | ICD-10-CM

## 2017-12-31 DIAGNOSIS — Z79891 Long term (current) use of opiate analgesic: Secondary | ICD-10-CM

## 2018-01-03 MED ORDER — ALPRAZOLAM 0.5 MG PO TABS: 0.5000 mg | ORAL_TABLET | Freq: Every evening | ORAL | 0 refills | Status: DC | PRN

## 2018-01-03 MED ORDER — HYDROCODONE-ACETAMINOPHEN 7.5-325 MG PO TABS: 1.0000 | ORAL_TABLET | Freq: Four times a day (QID) | ORAL | 0 refills | Status: DC | PRN

## 2018-01-04 ENCOUNTER — Other Ambulatory Visit: Payer: Self-pay | Admitting: Cardiovascular Disease

## 2018-01-04 ENCOUNTER — Other Ambulatory Visit: Payer: Self-pay | Admitting: Internal Medicine

## 2018-01-04 NOTE — Telephone Encounter (Signed)
Unable to schedule patient an appointment at this time.  Dr. Dareen Piano schedule is booked from now until June. Patient will have to wait until July schedule becomes available.  Forwarding back to pcp and nurse.

## 2018-01-10 ENCOUNTER — Other Ambulatory Visit: Payer: Self-pay | Admitting: Internal Medicine

## 2018-01-19 ENCOUNTER — Other Ambulatory Visit: Payer: Self-pay

## 2018-01-19 ENCOUNTER — Other Ambulatory Visit (HOSPITAL_COMMUNITY)
Admission: RE | Admit: 2018-01-19 | Discharge: 2018-01-19 | Disposition: A | Discharge status: Home / Self Care | Payer: Medicare HMO | Source: Ambulatory Visit | Attending: Infectious Diseases | Admitting: Infectious Diseases

## 2018-01-19 ENCOUNTER — Ambulatory Visit (INDEPENDENT_AMBULATORY_CARE_PROVIDER_SITE_OTHER): Payer: Medicare HMO | Admitting: Internal Medicine

## 2018-01-19 ENCOUNTER — Other Ambulatory Visit: Payer: Medicare HMO

## 2018-01-19 DIAGNOSIS — B20 Human immunodeficiency virus [HIV] disease: Secondary | ICD-10-CM | POA: Diagnosis not present

## 2018-01-19 DIAGNOSIS — Z113 Encounter for screening for infections with a predominantly sexual mode of transmission: Secondary | ICD-10-CM | POA: Diagnosis not present

## 2018-01-19 DIAGNOSIS — Z79899 Other long term (current) drug therapy: Secondary | ICD-10-CM

## 2018-01-19 DIAGNOSIS — Z791 Long term (current) use of non-steroidal anti-inflammatories (NSAID): Secondary | ICD-10-CM

## 2018-01-19 DIAGNOSIS — M25511 Pain in right shoulder: Secondary | ICD-10-CM

## 2018-01-19 HISTORY — DX: Pain in right shoulder: M25.511

## 2018-01-19 LAB — COMPREHENSIVE METABOLIC PANEL
AG RATIO: 1.9 (calc) (ref 1.0–2.5)
ALKALINE PHOSPHATASE (APISO): 57 U/L (ref 40–115)
ALT: 22 U/L (ref 9–46)
AST: 25 U/L (ref 10–35)
Albumin: 4.5 g/dL (ref 3.6–5.1)
BILIRUBIN TOTAL: 1 mg/dL (ref 0.2–1.2)
BUN/Creatinine Ratio: 19 (calc) (ref 6–22)
BUN: 24 mg/dL (ref 7–25)
CALCIUM: 10.4 mg/dL — AB (ref 8.6–10.3)
CO2: 27 mmol/L (ref 20–32)
Chloride: 96 mmol/L — ABNORMAL LOW (ref 98–110)
Creat: 1.29 mg/dL — ABNORMAL HIGH (ref 0.70–1.25)
Globulin: 2.4 g/dL (calc) (ref 1.9–3.7)
Glucose, Bld: 247 mg/dL — ABNORMAL HIGH (ref 65–99)
Potassium: 4.1 mmol/L (ref 3.5–5.3)
Sodium: 135 mmol/L (ref 135–146)
Total Protein: 6.9 g/dL (ref 6.1–8.1)

## 2018-01-19 LAB — LIPID PANEL
Cholesterol: 155 mg/dL (ref <200)
HDL: 53 mg/dL (ref >40)
LDL Cholesterol (Calc): 74 mg/dL (calc)
NON-HDL CHOLESTEROL (CALC): 102 mg/dL (ref <130)
TRIGLYCERIDES: 179 mg/dL — AB (ref <150)
Total CHOL/HDL Ratio: 2.9 (calc) (ref <5.0)

## 2018-01-19 LAB — CBC
HCT: 43 % (ref 38.5–50.0)
Hemoglobin: 14.9 g/dL (ref 13.2–17.1)
MCH: 32.2 pg (ref 27.0–33.0)
MCHC: 34.7 g/dL (ref 32.0–36.0)
MCV: 92.9 fL (ref 80.0–100.0)
RBC: 4.63 10*6/uL (ref 4.20–5.80)
RDW: 13.2 % (ref 11.0–15.0)
WBC: 7.3 10*3/uL (ref 3.8–10.8)

## 2018-01-19 MED ORDER — NAPROXEN 500 MG PO TABS: 500.0000 mg | ORAL_TABLET | Freq: Two times a day (BID) | ORAL | 0 refills | Status: DC

## 2018-01-19 NOTE — Patient Instructions (Signed)
FOLLOW-UP INSTRUCTIONS When: As needed For: Shoulder pain What to bring: Meds  Riley Jones,  It was a pleasure to meet you. I am sorry to hear about your shoulder pain. I have given you a two week course of an anti inflammatory medicine called naproxen. Please take this twice a day with meals. I have also referred you to physical therapy. If your pain does not improve over the next few weeks, please return to evaluation. If you have any questions or concerns, call our clinic at [phone] or after hours call [phone] and ask for the internal medicine resident on call. Thank you!  - Dr. Philipp Ovens

## 2018-01-19 NOTE — Assessment & Plan Note (Signed)
Patient is here with complaint of progressive right shoulder pain over the past 2-3 months.  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  He denies any injury to the shoulder.  On exam his range of motion is limited to approximately 135 degrees of abduction.  He has diffuse tenderness over his deltoid and trapezius muscle.  He also has pain elicited with internal and external rotation against resistance as well as the empty can test.  His strength and sensation is intact.  Unclear reason for shoulder pain.  Possible rotator cuff tendinopathy vs. subacromial bursitis.  We will treat conservatively with a course of NSAIDs and physical therapy.  Patient instructed to return for ultrasound and possible steroid injection if pain persists. -- Naproxen 500 mg BID x 2 weeks -- PT referral  -- Follow up as needed

## 2018-01-19 NOTE — Progress Notes (Signed)
   CC: Right shoulder pain   HPI:  [name] is a 62 y.o. male with past medical history outlined below here for right shoulder pain. For the details of today's visit, please refer to the assessment and plan.  Past Medical History:  Diagnosis Date  . Anxiety   . Arthritis    "pretty much all over"  . Asthma    "mild" (07/17/2014)  . Atypical angina ([place])    Myoview 2003, Normal EF 64%  . CAD (coronary artery disease)    a. 10/20 DES to 80% mid LAD  . COPD (chronic obstructive pulmonary disease) ([place])    "mild" (07/17/2014)  . Depression   . Foot pain 03/30/2011  . GERD (gastroesophageal reflux disease)   . H/O hiatal hernia   . Headache    "weekly" (06/2014)  . History of gout   . HIV (human immunodeficiency virus infection) ([place]) 1995   DX after shingles followed by Dr. Ola Spurr ([place])  . Hx of cardiovascular stress test    ETT-Myoview (3/16):  Ex 10:15, No Ischemia, EF 57%; Normal Study  . Hyperlipidemia   . Hypertension    Well Controlled off meds  . Kidney stones    "passed them all"  . Pneumonia 4-5 times  . Rheumatoid arthritis(714.0)    Followed by Dr. Charlestine Night, off MTX and prdnisone since 2007  . Sleep apnea    does not wear mask (07/17/2014)  . Type II diabetes mellitus ([place])     Review of Systems  Musculoskeletal:       Shoulder pain  Neurological: Negative for weakness.    Physical Exam:  Vitals:   01/19/18 1528  BP: (!) 126/59  Pulse: 77  Temp: 98.3 F (36.8 C)  TempSrc: Oral  SpO2: 97%  Weight: 193 lb 11.2 oz (87.9 kg)  Height: 5\' 10"  (1.778 m)    Constitutional: NAD, appears comfortable Pulmonary/Chest: Breathing comfortably on RA MKS: Right shoulder abduction / ROM is limited to 135 degrees. Diffuse muscular tenderness over the deltoid and trapezius muscle. Pain is also elicited with internal and external rotation against resistance, as well as empty can test. Skin: No rashes or erythema  Psychiatric: Normal mood and  affect  Assessment & Plan:   See Encounters Tab for problem based charting.  Patient discussed with Dr. Beryle Beams

## 2018-01-20 LAB — URINE CYTOLOGY ANCILLARY ONLY
Chlamydia: NEGATIVE
Neisseria Gonorrhea: NEGATIVE

## 2018-01-20 LAB — T-HELPER CELL (CD4) - (RCID CLINIC ONLY)
CD4 T CELL HELPER: 41 % (ref 33–55)
CD4 T Cell Abs: 700 /uL (ref 400–2700)

## 2018-01-20 LAB — RPR: RPR Ser Ql: NONREACTIVE

## 2018-01-20 NOTE — Progress Notes (Signed)
Medicine attending: Medical history, presenting problems, physical findings, and medications, reviewed with resident physician Dr Carolyn Guilioud on the day of the patient visit and I concur with her evaluation and management plan. 

## 2018-01-21 ENCOUNTER — Other Ambulatory Visit: Payer: Self-pay | Admitting: *Deleted

## 2018-01-21 LAB — HIV-1 RNA QUANT-NO REFLEX-BLD
HIV 1 RNA QUANT: 39 {copies}/mL — AB
HIV-1 RNA Quant, Log: 1.59 Log copies/mL — ABNORMAL HIGH

## 2018-01-21 MED ORDER — GLUCOSE BLOOD VI STRP: ORAL_STRIP | 6 refills | Status: DC

## 2018-01-30 ENCOUNTER — Other Ambulatory Visit: Payer: Self-pay | Admitting: Internal Medicine

## 2018-01-30 DIAGNOSIS — Z79891 Long term (current) use of opiate analgesic: Secondary | ICD-10-CM

## 2018-01-30 DIAGNOSIS — M25511 Pain in right shoulder: Secondary | ICD-10-CM

## 2018-01-30 DIAGNOSIS — F411 Generalized anxiety disorder: Secondary | ICD-10-CM

## 2018-01-31 ENCOUNTER — Encounter: Payer: Self-pay | Admitting: *Deleted

## 2018-01-31 MED ORDER — ALPRAZOLAM 0.5 MG PO TABS: 0.5000 mg | ORAL_TABLET | Freq: Every evening | ORAL | 0 refills | Status: DC | PRN

## 2018-01-31 MED ORDER — HYDROCODONE-ACETAMINOPHEN 7.5-325 MG PO TABS: 1.0000 | ORAL_TABLET | Freq: Four times a day (QID) | ORAL | 0 refills | Status: DC | PRN

## 2018-02-01 ENCOUNTER — Other Ambulatory Visit: Payer: Self-pay | Admitting: Internal Medicine

## 2018-02-01 ENCOUNTER — Other Ambulatory Visit: Payer: Self-pay | Admitting: Cardiovascular Disease

## 2018-02-01 DIAGNOSIS — M069 Rheumatoid arthritis, unspecified: Secondary | ICD-10-CM

## 2018-02-01 DIAGNOSIS — I10 Essential (primary) hypertension: Secondary | ICD-10-CM

## 2018-02-01 NOTE — Telephone Encounter (Signed)
Called patient regarding denial of naproxen refill. Patient states naproxen has really helped with shoulder pain but understands this was a short term course. Patient is supposed to start PT for his pain. He received letter today with their contact info and will call them to schedule. Patient aware to schedule appt if pain persists. Hubbard Hartshorn, RN, BSN

## 2018-02-02 ENCOUNTER — Encounter: Payer: Self-pay | Admitting: Infectious Diseases

## 2018-02-02 ENCOUNTER — Ambulatory Visit: Payer: Medicare HMO | Attending: Oncology | Admitting: Physical Therapy

## 2018-02-02 ENCOUNTER — Ambulatory Visit: Payer: Medicare HMO | Admitting: Infectious Diseases

## 2018-02-02 ENCOUNTER — Other Ambulatory Visit: Payer: Self-pay | Admitting: Internal Medicine

## 2018-02-02 ENCOUNTER — Encounter: Payer: Self-pay | Admitting: Physical Therapy

## 2018-02-02 VITALS — BP 139/76 | HR 63 | Temp 97.8°F | Resp 18 | Ht 70.0 in | Wt 197.8 lb

## 2018-02-02 DIAGNOSIS — F17219 Nicotine dependence, cigarettes, with unspecified nicotine-induced disorders: Secondary | ICD-10-CM | POA: Diagnosis not present

## 2018-02-02 DIAGNOSIS — M25511 Pain in right shoulder: Secondary | ICD-10-CM

## 2018-02-02 DIAGNOSIS — B20 Human immunodeficiency virus [HIV] disease: Secondary | ICD-10-CM | POA: Diagnosis not present

## 2018-02-02 DIAGNOSIS — R293 Abnormal posture: Secondary | ICD-10-CM

## 2018-02-02 DIAGNOSIS — Z113 Encounter for screening for infections with a predominantly sexual mode of transmission: Secondary | ICD-10-CM

## 2018-02-02 DIAGNOSIS — I2583 Coronary atherosclerosis due to lipid rich plaque: Secondary | ICD-10-CM

## 2018-02-02 DIAGNOSIS — Z79899 Other long term (current) drug therapy: Secondary | ICD-10-CM | POA: Diagnosis not present

## 2018-02-02 DIAGNOSIS — J309 Allergic rhinitis, unspecified: Secondary | ICD-10-CM

## 2018-02-02 DIAGNOSIS — N5201 Erectile dysfunction due to arterial insufficiency: Secondary | ICD-10-CM | POA: Diagnosis not present

## 2018-02-02 DIAGNOSIS — E1142 Type 2 diabetes mellitus with diabetic polyneuropathy: Secondary | ICD-10-CM | POA: Diagnosis not present

## 2018-02-02 DIAGNOSIS — J42 Unspecified chronic bronchitis: Secondary | ICD-10-CM | POA: Diagnosis not present

## 2018-02-02 DIAGNOSIS — M6281 Muscle weakness (generalized): Secondary | ICD-10-CM

## 2018-02-02 DIAGNOSIS — I2510 Atherosclerotic heart disease of native coronary artery without angina pectoris: Secondary | ICD-10-CM

## 2018-02-02 NOTE — Assessment & Plan Note (Signed)
He is doing well Neuropathy stable.  FSG have have been variable.  Appreciate IM f/u.

## 2018-02-02 NOTE — Assessment & Plan Note (Signed)
Doing well , no SOB.

## 2018-02-02 NOTE — Progress Notes (Signed)
   Subjective:    Patient ID: Riley Jones, male    DOB: October 15, 1955, 62 y.o.   MRN: [id number]  HPI 62yo M with hx of HIV+ (~36yrs), DM2 (>23yrs, on insulin) and RA (took MTX without relief, off prednisone), COPD. He has been maintained on atripla (has been on multiple previous meds).  Also hx of smoking. CAD s/p DES to mid LAD in 06/2014, his EF 55-65% at that time. He had a myoview 11-2014- low risk.  At his visit 07-2017 he was changed to biktarvy.  He was also given a rx for ED- viagra.  As been feeling well.  Has had some seasonal allergies.  Continues to smoke.  Recently broke tooth and lost cap. He has f/u appt in June when his dental insurance comes on line.  Is seeing md for pain in his R shoulder. He is going to PT. Taking Naproxen.  Labs note an increase in Cr.   FSG have been variable. Has IM f/u.   Has been taking flonase for seasonal allergies (also claritin, zantac). Has been working outdoors.   HIV 1 RNA Quant (copies/mL)  Date Value  01/19/2018 39 (H)  06/16/2017 <20 DETECTED (A)  10/29/2016 <20 NOT DETECTED   CD4 T Cell Abs (/uL)  Date Value  01/19/2018 700  06/16/2017 720  10/29/2016 870    Review of Systems  Constitutional: Negative for appetite change and unexpected weight change.  HENT: Positive for rhinorrhea and voice change.   Respiratory: Negative for cough and shortness of breath.   Gastrointestinal: Negative for constipation and diarrhea.  Endocrine: Positive for polyuria.  Genitourinary: Negative for difficulty urinating.  Neurological: Positive for numbness.  (on "water pill") Has no wounds on his feet.  Please see HPI. All other systems reviewed and negative.     Objective:   Physical Exam  Constitutional: He is oriented to person, place, and time. He appears well-developed and well-nourished. No distress.  HENT:  Mouth/Throat: No oropharyngeal exudate.  Eyes: Pupils are equal, round, and reactive to light. Conjunctivae and EOM are  normal.  Neck: Normal range of motion. Neck supple.  Cardiovascular: Normal rate, regular rhythm and normal heart sounds.  Pulmonary/Chest: Effort normal and breath sounds normal.  Abdominal: Soft. Bowel sounds are normal. There is no tenderness.  Musculoskeletal: Normal range of motion. He exhibits no edema.  Lymphadenopathy:    He has no cervical adenopathy.  Neurological: He is alert and oriented to person, place, and time.  Psychiatric: He has a normal mood and affect.       Assessment & Plan:

## 2018-02-02 NOTE — Patient Instructions (Signed)
Access Code: [email or web address]  URL: [email or web address]  Date: 02/02/2018  Prepared by: Lovett Calender   Exercises  Doorway Pec Stretch at 90 Degrees Abduction - 10 reps - 3 sets - 1x daily - 7x weekly

## 2018-02-02 NOTE — Assessment & Plan Note (Addendum)
Has been doing well  No CP, no SOB.  F/u appt pending.

## 2018-02-02 NOTE — Assessment & Plan Note (Signed)
He is doing well Blip on med change Vaccines are uptodate. Has had colon in last year.  Offered/refused condoms.    rtc in 9 months

## 2018-02-02 NOTE — Assessment & Plan Note (Signed)
Did not get viagra, too expensive.  Got something online. sildalafil $30. This helped.

## 2018-02-02 NOTE — Therapy (Addendum)
[facility] [address] [place], [place], [address] Phone: [phone]   Fax:  [phone]  Physical Therapy Evaluation  Patient Details  Name: Riley Jones MRN: [id number] Date of Birth: 1956/08/25 Referring Provider: Velna Ochs, MD   Encounter Date: 02/02/2018  PT End of Session - 02/02/18 1402    Visit Number  1    Date for PT Re-Evaluation  03/16/18    PT Start Time  6962    PT Stop Time  1215    PT Time Calculation (min)  30 min    Activity Tolerance  Patient tolerated treatment well    Behavior During Therapy  [facility] for tasks assessed/performed       Past Medical History:  Diagnosis Date  . Anxiety   . Arthritis    "pretty much all over"  . Asthma    "mild" (07/17/2014)  . Atypical angina ([place])    Myoview 2003, Normal EF 64%  . CAD (coronary artery disease)    a. 10/20 DES to 80% mid LAD  . COPD (chronic obstructive pulmonary disease) ([place])    "mild" (07/17/2014)  . Depression   . Foot pain 03/30/2011  . GERD (gastroesophageal reflux disease)   . H/O hiatal hernia   . Headache    "weekly" (06/2014)  . History of gout   . HIV (human immunodeficiency virus infection) ([place]) 1995   DX after shingles followed by Dr. Ola Spurr (ID)  . Hx of cardiovascular stress test    ETT-Myoview (3/16):  Ex 10:15, No Ischemia, EF 57%; Normal Study  . Hyperlipidemia   . Hypertension    Well Controlled off meds  . Kidney stones    "passed them all"  . Pneumonia 4-5 times  . Rheumatoid arthritis(714.0)    Followed by Dr. Charlestine Night, off MTX and prdnisone since 2007  . Sleep apnea    does not wear mask (07/17/2014)  . Type II diabetes mellitus ([place])     Past Surgical History:  Procedure Laterality Date  . APPENDECTOMY  ~ 1981  . CARPAL TUNNEL RELEASE Left 1990's  . CORONARY ANGIOPLASTY WITH STENT PLACEMENT  07/17/2014   a. DES to 80% mid LAD  . HERNIA REPAIR    . LAPAROSCOPIC INCISIONAL / UMBILICAL /  VENTRAL HERNIA REPAIR  1990's   "it was a double; not inguinal"  . LEFT HEART CATHETERIZATION WITH CORONARY ANGIOGRAM N/A 07/17/2014   Procedure: LEFT HEART CATHETERIZATION WITH CORONARY ANGIOGRAM;  Surgeon: [name], MD;  Location: [facility] C[facility] LAB;  Service: Cardiovascular;  Laterality: N/A;  . PERCUTANEOUS STENT INTERVENTION  07/17/2014   Procedure: PERCUTANEOUS STENT INTERVENTION;  Surgeon: [name], MD;  Location: [facility] CATH LAB;  Service: Cardiovascular;;  . TONSILLECTOMY AND ADENOIDECTOMY  1990's  . TRIGGER FINGER RELEASE Left 1990's   2nd & 5th digits  . UMBILICAL HERNIA REPAIR  1980's   w/mesh  . UVULOPALATOPHARYNGOPLASTY (UPPP)/TONSILLECTOMY/SEPTOPLASTY  1990's    There were no vitals filed for this visit.   Subjective Assessment - 02/02/18 1145    Subjective  FOTO score was not obtained at this visit.  Pt presents to clinic due to right shoulder pain that extends across scapula and into upper thoracic and lower cervical regions.  Pt has had pain for at lease 2 months.    Limitations  Other (comment) driving    Patient Stated Goals  painfree - sleeping, driving    Currently in Pain?  Yes    Pain Score  5     Pain Location  Scapula    Pain Orientation  Right;Posterior    Pain Descriptors / Indicators  Aching;Stabbing;Sharp    Pain Type  Acute pain    Pain Radiating Towards  starts upper thoracic goes up into the neck and out into the shoulder blade    Pain Onset  More than a month ago    Pain Frequency  Intermittent    Aggravating Factors   sleeping, difficultly    Pain Relieving Factors  slight relief with naproxen    Effect of Pain on Daily Activities  I have been doing every just painful    Multiple Pain Sites  No         [facility] PT Assessment - 02/02/18 0001      Assessment   Medical Diagnosis  M25.511 (ICD-10-CM) - Right shoulder pain, unspecified chronicity    Referring Provider  Velna Ochs, MD    Onset Date/Surgical Date  -- 2 months ago    Hand  Dominance  Right    Prior Therapy  No      Precautions   Precautions  None      Restrictions   Weight Bearing Restrictions  No      Balance Screen   Has the patient fallen in the past 6 months  No      [place] residence    Living Arrangements  Alone      Prior Function   Level of Independence  Independent    Vocation Requirements  truck driver      Cognition   Overall Cognitive Status  Within Functional Limits for tasks assessed      Observation/Other Assessments   Focus on Therapeutic Outcomes (FOTO)   not obtained at this visit, will assess next visit      Posture/Postural Control   Posture/Postural Control  Postural limitations    Postural Limitations  Rounded Shoulders;Forward head;Increased lumbar lordosis;Increased thoracic kyphosis;Anterior pelvic tilt moderate-severe upper/lower cross type posture      ROM / Strength   AROM / PROM / Strength  AROM;PROM;Strength      AROM   AROM Assessment Site  Shoulder    Right/Left Shoulder  Right;Left    Right Shoulder Flexion  140 Degrees    Right Shoulder ABduction  120 Degrees    Left Shoulder Flexion  152 Degrees    Left Shoulder ABduction  146 Degrees      PROM   Overall PROM Comments  left shoulder and cervical PROM WFL, hypomobile thoracic and scapula      Strength   Strength Assessment Site  Shoulder    Right/Left Shoulder  Right;Left    Right Shoulder Flexion  4/5    Right Shoulder ABduction  4/5    Right Shoulder Internal Rotation  4/5    Right Shoulder External Rotation  4/5    Right Shoulder Horizontal ABduction  4/5    Right Shoulder Horizontal ADduction  4/5    Left Shoulder Flexion  5/5    Left Shoulder Extension  5/5    Left Shoulder ABduction  5/5    Left Shoulder Internal Rotation  5/5    Left Shoulder External Rotation  5/5    Left Shoulder Horizontal ABduction  5/5    Left Shoulder Horizontal ADduction  5/5      Flexibility   Soft Tissue Assessment  /Muscle Length  -- restrictions in pecs  Palpation   Palpation comment  tight periscapular, rhomboids, pecs, scalenes, suboccipitals, hypomobile T1-3, restricted scapula movements      Special Tests    Special Tests  Thoracic Outlet Syndrome;Rotator Cuff Impingement    Thoracic Outlet Syndrome   Tinel's Scalenes;Adson Test    Rotator Cuff Impingment tests  Empty Can test;Full Can test;Hawkins- [name] test;Neer impingement test      Adson Test   Findings  Negative    Side   Right      Tinel's Scalenes   Findings  Positive    Side  Right      Neer Impingement test    Findings  Positive    Side  Right      Hawkins-[name] test   Findings  Negative      Empty Can test   Findings  Positive    Side  Right    Comment  positive for pain under the axillary      Full Can test   Findings  Positive    Side  Right    Comment  positive for pain under the axillary       Ambulation/Gait   Gait Pattern  Within Functional Limits                Objective measurements completed on examination: See above findings.      [place] Adult PT Treatment/Exercise - 02/02/18 0001      Self-Care   Self-Care  Other Self-Care Comments    Other Self-Care Comments   POC and initial HEP             PT Education - 02/02/18 1446    Education provided  Yes    Education Details   Access Code: [employer]     Person(s) Educated  Patient    Methods  Explanation;Handout;Demonstration;Tactile cues;Verbal cues    Comprehension  Verbalized understanding;Returned demonstration       PT Short Term Goals - 02/02/18 1451      PT SHORT TERM GOAL #1   Title  pt will demonstrate understanding of upright posture in sitting and standing    Time  2    Period  Weeks    Status  New    Target Date  02/16/18      PT SHORT TERM GOAL #2   Title  ind with initial HEP    Time  2    Period  Weeks    Status  New    Target Date  02/16/18        PT Long Term Goals - 02/02/18 1448      PT  LONG TERM GOAL #1   Title  ind with advanced HEP    Time  6    Period  Weeks    Status  New    Target Date  03/16/18      PT LONG TERM GOAL #2   Title  pt will be able to increase scapular mobility in order to improve upright posture     Time  6    Period  Weeks    Status  New    Target Date  03/16/18      PT LONG TERM GOAL #3   Title  Pt will be able to perform full AROM right shoulder without increased pain for functional overhead activities.    Time  6    Period  Weeks    Status  New    Target Date  03/16/18  PT LONG TERM GOAL #4   Title  pt will report 50% reduced pain    Time  6    Period  Weeks    Status  New    Target Date  03/16/18      PT LONG TERM GOAL #5   Title  pt will demonstrate 5/5 MMT throughout right shoulder without increased pain    Time  6    Period  Weeks    Status  New    Target Date  03/16/18             Plan - 02/02/18 1452    Clinical Impression Statement  Pt presents to clinic due to scapular pain that has been progressively worsening over the last 2 months.  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  He his decreased scapular and thoracic ROM, decreased right shoulder AROM. Pt has weakness throughout right shoulder.  Pt demonstrates poor ability to engage transverse abdominis and poor core control in sitting and standing.  He has muscle spasms in pecs, rhomboids, scalenes, suboccipitals, thoracic paraspinals.  Pt will benefit from skilled PT to address impairments and assist with pain management    History and Personal Factors relevant to plan of care:  abdominal surgery for hernia (mesh repair)    Clinical Presentation  Evolving    Clinical Presentation due to:  pain has gotten worse over time    Clinical Decision Making  Low    Rehab Potential  Good    PT Treatment/Interventions  ADLs/Self Care Home Management;Biofeedback;Cryotherapy;Electrical Stimulation;Iontophoresis '4mg'$ /ml Dexamethasone;Therapeutic  exercise;Therapeutic activities;Neuromuscular re-education;Patient/family education;Passive range of motion;Dry needling;Manual techniques;Taping    PT Next Visit Plan  FOTO, posutre, thoracic extension, STM and modalities for pain, strengthening as tolerated    Recommended Other Services  Access Code: [email or web address]     Consulted and Agree with Plan of Care  Patient       Patient will benefit from skilled therapeutic intervention in order to improve the following deficits and impairments:  Pain, Impaired UE functional use, Postural dysfunction, Increased muscle spasms, Decreased strength, Decreased range of motion  Visit Diagnosis: Abnormal posture  Muscle weakness (generalized)  Acute pain of right shoulder     Problem List Patient Active Problem List   Diagnosis Date Noted  . Right shoulder pain 01/19/2018  . GERD (gastroesophageal reflux disease) 12/01/2016  . Chronic allergic rhinitis 08/25/2016  . Chronic prescription opiate use 11/22/2014  . CAD (coronary artery disease) 09/05/2014  . Unstable angina ([place]) 07/17/2014  . Anxiety state 12/15/2012  . Preventative health care 12/15/2012  . COPD (chronic obstructive pulmonary disease) ([place]) 12/04/2012  . Erectile dysfunction 10/21/2011  . Insomnia 08/03/2011  . Rheumatoid arthritis ([place]) 02/03/2011  . Nicotine dependence 10/12/2006  . Hypercholesterolemia 10/11/2006  . Human immunodeficiency virus (HIV) disease ([place]) 07/08/2006  . Diabetes mellitus ([place]) 07/08/2006  . Depression 07/08/2006  . Essential hypertension 07/08/2006    Riley Jones, PT 02/02/2018, 3:05 PM  [facility] [address] [place], [place], [address] Phone: [phone]   Fax:  [phone]  Name: Riley Jones MRN: [id number] Date of Birth: Feb 03, 1956  PHYSICAL THERAPY DISCHARGE SUMMARY  Visits from Start of Care: 1  Current functional level related to goals / functional  outcomes: eval only   Remaining deficits: eval only   Education / Equipment: HEP  Plan: Patient agrees to discharge.  Patient goals were not met. Patient is being discharged  due to not returning since the last visit.  ?????     [employer], PT 04/07/18 2:42 PM

## 2018-02-02 NOTE — Assessment & Plan Note (Signed)
Has quit in the last year, restarted.  He is going to quit again.

## 2018-02-02 NOTE — Assessment & Plan Note (Signed)
On allergy rx's.  Hopefully will improve as seasons change, less pollen.

## 2018-02-08 ENCOUNTER — Encounter: Payer: Medicare HMO | Admitting: Physical Therapy

## 2018-02-08 ENCOUNTER — Other Ambulatory Visit: Payer: Self-pay | Admitting: Internal Medicine

## 2018-02-08 DIAGNOSIS — E118 Type 2 diabetes mellitus with unspecified complications: Secondary | ICD-10-CM

## 2018-02-09 ENCOUNTER — Encounter: Payer: Medicare HMO | Admitting: Physical Therapy

## 2018-02-22 ENCOUNTER — Other Ambulatory Visit: Payer: Self-pay | Admitting: Internal Medicine

## 2018-02-22 DIAGNOSIS — Z79891 Long term (current) use of opiate analgesic: Secondary | ICD-10-CM

## 2018-02-22 DIAGNOSIS — F411 Generalized anxiety disorder: Secondary | ICD-10-CM

## 2018-02-23 NOTE — Telephone Encounter (Signed)
He is not due for a refill yet. Is he running out?

## 2018-02-23 NOTE — Telephone Encounter (Signed)
Last rx written 01/31/18. Last OV 07/06/17; saw Dr Philipp Ovens 01/19/18. Next OV no scheduled visit. UDS  03/30/17.

## 2018-02-23 NOTE — Telephone Encounter (Signed)
Pt states he was calling to make sure he got them on time, states his arthritis is really bothering him lately. He states he has some left though, doesn't know how many because he's not at home

## 2018-02-23 NOTE — Telephone Encounter (Signed)
Left messages on both numbers requesting return call. Will need to discuss refill requests for alprazolam and hydrocodone as patient has requested refills too soon. Hubbard Hartshorn, RN, BSN

## 2018-02-24 MED ORDER — HYDROCODONE-ACETAMINOPHEN 7.5-325 MG PO TABS: 1.0000 | ORAL_TABLET | Freq: Four times a day (QID) | ORAL | 0 refills | Status: DC | PRN

## 2018-02-24 MED ORDER — ALPRAZOLAM 0.5 MG PO TABS: 0.5000 mg | ORAL_TABLET | Freq: Every evening | ORAL | 0 refills | Status: DC | PRN

## 2018-03-16 ENCOUNTER — Other Ambulatory Visit: Payer: Self-pay | Admitting: Internal Medicine

## 2018-03-28 ENCOUNTER — Telehealth: Payer: Self-pay | Admitting: Internal Medicine

## 2018-03-28 ENCOUNTER — Other Ambulatory Visit: Payer: Self-pay | Admitting: Internal Medicine

## 2018-03-28 DIAGNOSIS — Z79891 Long term (current) use of opiate analgesic: Secondary | ICD-10-CM

## 2018-03-28 DIAGNOSIS — F411 Generalized anxiety disorder: Secondary | ICD-10-CM

## 2018-03-28 MED ORDER — HYDROCODONE-ACETAMINOPHEN 7.5-325 MG PO TABS: 1.0000 | ORAL_TABLET | Freq: Four times a day (QID) | ORAL | 0 refills | Status: DC | PRN

## 2018-03-28 MED ORDER — ALPRAZOLAM 0.5 MG PO TABS: 0.5000 mg | ORAL_TABLET | Freq: Every evening | ORAL | 0 refills | Status: DC | PRN

## 2018-03-28 NOTE — Telephone Encounter (Signed)
Dr. Dareen Piano does not have any openings in July and his August schedule is currently not in Epic. If patient calls he will not be able to get an appointment at this moment.  Just wanted to let you know.

## 2018-03-28 NOTE — Telephone Encounter (Signed)
Please schedule pt an appt with Dr Dareen Piano. Thanks

## 2018-03-28 NOTE — Telephone Encounter (Signed)
Last rx written 02/24/18. Last OV saw Dr Philipp Ovens 01/19/18.  UDS 03/30/17.

## 2018-04-06 ENCOUNTER — Other Ambulatory Visit: Payer: Self-pay | Admitting: Internal Medicine

## 2018-04-06 DIAGNOSIS — I10 Essential (primary) hypertension: Secondary | ICD-10-CM

## 2018-04-07 NOTE — Telephone Encounter (Signed)
Next appt scheduled 8/6 with PCP. 

## 2018-04-14 ENCOUNTER — Other Ambulatory Visit: Payer: Self-pay | Admitting: Internal Medicine

## 2018-04-28 ENCOUNTER — Other Ambulatory Visit: Payer: Self-pay | Admitting: Internal Medicine

## 2018-04-28 DIAGNOSIS — F411 Generalized anxiety disorder: Secondary | ICD-10-CM

## 2018-04-28 DIAGNOSIS — Z79891 Long term (current) use of opiate analgesic: Secondary | ICD-10-CM

## 2018-04-29 ENCOUNTER — Other Ambulatory Visit: Payer: Self-pay | Admitting: Internal Medicine

## 2018-04-29 MED ORDER — ALPRAZOLAM 0.5 MG PO TABS: 0.5000 mg | ORAL_TABLET | Freq: Every evening | ORAL | 0 refills | Status: DC | PRN

## 2018-04-29 MED ORDER — HYDROCODONE-ACETAMINOPHEN 7.5-325 MG PO TABS: 1.0000 | ORAL_TABLET | Freq: Four times a day (QID) | ORAL | 0 refills | Status: DC | PRN

## 2018-04-29 NOTE — Telephone Encounter (Signed)
Next appt scheduled 8/6 with PCP. 

## 2018-05-03 ENCOUNTER — Other Ambulatory Visit: Payer: Self-pay

## 2018-05-03 ENCOUNTER — Encounter: Payer: Self-pay | Admitting: Internal Medicine

## 2018-05-03 ENCOUNTER — Ambulatory Visit: Payer: Medicare HMO | Admitting: Internal Medicine

## 2018-05-03 VITALS — BP 135/87 | HR 55 | Temp 97.6°F | Ht 70.0 in | Wt 192.8 lb

## 2018-05-03 DIAGNOSIS — G5601 Carpal tunnel syndrome, right upper limb: Secondary | ICD-10-CM | POA: Diagnosis not present

## 2018-05-03 DIAGNOSIS — I2510 Atherosclerotic heart disease of native coronary artery without angina pectoris: Secondary | ICD-10-CM | POA: Diagnosis not present

## 2018-05-03 DIAGNOSIS — E1142 Type 2 diabetes mellitus with diabetic polyneuropathy: Secondary | ICD-10-CM

## 2018-05-03 DIAGNOSIS — K219 Gastro-esophageal reflux disease without esophagitis: Secondary | ICD-10-CM | POA: Diagnosis not present

## 2018-05-03 DIAGNOSIS — I2583 Coronary atherosclerosis due to lipid rich plaque: Secondary | ICD-10-CM

## 2018-05-03 DIAGNOSIS — Z79891 Long term (current) use of opiate analgesic: Secondary | ICD-10-CM

## 2018-05-03 DIAGNOSIS — J449 Chronic obstructive pulmonary disease, unspecified: Secondary | ICD-10-CM | POA: Diagnosis not present

## 2018-05-03 DIAGNOSIS — E119 Type 2 diabetes mellitus without complications: Secondary | ICD-10-CM | POA: Diagnosis not present

## 2018-05-03 DIAGNOSIS — N529 Male erectile dysfunction, unspecified: Secondary | ICD-10-CM

## 2018-05-03 DIAGNOSIS — F17219 Nicotine dependence, cigarettes, with unspecified nicotine-induced disorders: Secondary | ICD-10-CM

## 2018-05-03 DIAGNOSIS — F32A Depression, unspecified: Secondary | ICD-10-CM

## 2018-05-03 DIAGNOSIS — Z7982 Long term (current) use of aspirin: Secondary | ICD-10-CM

## 2018-05-03 DIAGNOSIS — J42 Unspecified chronic bronchitis: Secondary | ICD-10-CM

## 2018-05-03 DIAGNOSIS — B20 Human immunodeficiency virus [HIV] disease: Secondary | ICD-10-CM

## 2018-05-03 DIAGNOSIS — N5201 Erectile dysfunction due to arterial insufficiency: Secondary | ICD-10-CM

## 2018-05-03 DIAGNOSIS — F339 Major depressive disorder, recurrent, unspecified: Secondary | ICD-10-CM

## 2018-05-03 DIAGNOSIS — Z7902 Long term (current) use of antithrombotics/antiplatelets: Secondary | ICD-10-CM

## 2018-05-03 DIAGNOSIS — M25511 Pain in right shoulder: Secondary | ICD-10-CM | POA: Diagnosis not present

## 2018-05-03 DIAGNOSIS — F411 Generalized anxiety disorder: Secondary | ICD-10-CM

## 2018-05-03 DIAGNOSIS — M069 Rheumatoid arthritis, unspecified: Secondary | ICD-10-CM

## 2018-05-03 DIAGNOSIS — F17210 Nicotine dependence, cigarettes, uncomplicated: Secondary | ICD-10-CM

## 2018-05-03 DIAGNOSIS — I10 Essential (primary) hypertension: Secondary | ICD-10-CM

## 2018-05-03 DIAGNOSIS — Z79899 Other long term (current) drug therapy: Secondary | ICD-10-CM

## 2018-05-03 DIAGNOSIS — Z794 Long term (current) use of insulin: Secondary | ICD-10-CM

## 2018-05-03 DIAGNOSIS — F329 Major depressive disorder, single episode, unspecified: Secondary | ICD-10-CM

## 2018-05-03 LAB — POCT GLYCOSYLATED HEMOGLOBIN (HGB A1C): HEMOGLOBIN A1C: 8.9 % — AB (ref 4.0–5.6)

## 2018-05-03 LAB — GLUCOSE, CAPILLARY: Glucose-Capillary: 167 mg/dL — ABNORMAL HIGH (ref 70–99)

## 2018-05-03 NOTE — Assessment & Plan Note (Signed)
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

## 2018-05-03 NOTE — Assessment & Plan Note (Signed)
-  This problem is chronic and stable -Patient states her symptoms are well controlled on Cymbalta 60 mg -We will continue with this medication for now -No further work-up at this time

## 2018-05-03 NOTE — Assessment & Plan Note (Signed)
-  This problem is chronic and stable -We will continue with aspirin and Plavix for now -Patient denies any chest pain or shortness of breath currently -No further work-up at this time.

## 2018-05-03 NOTE — Assessment & Plan Note (Signed)
-  Patient states that he has intermittent tingling and numbness in his right fingers as well as weakness in his gripping of his right hand -Patient states that he has a history of carpal tunnel syndrome and had been doing better but over the last couple of months feels this is getting worse -On exam he was noted to have mildly decreased grip strength in the right as well as a positive Phalen sign -Patient was told to wear wrist splints especially at night on his right hand -If his problem persists we will consider referring him for nerve conduction studies and possible referral for hand surgeon

## 2018-05-03 NOTE — Progress Notes (Signed)
   Subjective:    Patient ID: Riley Jones, male    DOB: 1956-01-21, 62 y.o.   MRN: [id number]  HPI I have seen and examined this patient.  Patient is here for routine follow-up of his hypertension and diabetes.   Patient states that he feels well today.  He does complain of pain in his right wrist and worries that his carpal tunnel syndrome is flaring up again.  He does complain of difficulty gripping things with his right hand and tingling and numbness in his fingers.  He denies any other complaints at this time.  He is compliant with all his medications.   Review of Systems  Constitutional: Negative.   HENT: Negative.   Respiratory: Negative.   Cardiovascular: Negative.   Gastrointestinal: Negative.   Musculoskeletal: Positive for arthralgias.  Skin: Negative.   Neurological: Negative.   Psychiatric/Behavioral: Negative.        Objective:   Physical Exam  Constitutional: He is oriented to person, place, and time. He appears well-developed and well-nourished.  HENT:  Head: Normocephalic and atraumatic.  Mouth/Throat: No oropharyngeal exudate.  Neck: Neck supple.  Cardiovascular: Normal rate, regular rhythm and normal heart sounds.  Pulmonary/Chest: Effort normal and breath sounds normal. He has no wheezes. He has no rales.  Abdominal: Soft. Bowel sounds are normal. He exhibits no distension. There is no tenderness.  Musculoskeletal: Normal range of motion. He exhibits no edema.  Patient noted to have very slightly decreased grip strength in his right hand compared to his left.  His Phalen's sign is positive.  He does not have reproducible any numbness in his fingers on tapping his wrist.  Neurological: He is alert and oriented to person, place, and time.  Psychiatric: He has a normal mood and affect. His behavior is normal.          Assessment & Plan:  Please see problem based charting for assessment and plan:

## 2018-05-03 NOTE — Assessment & Plan Note (Signed)
-  This problem is chronic and stable -We will continue with Cymbalta 60 mg for now as well as Xanax 0.5 mg at night as needed -Patient states that his anxiety is slowly improving on the Cymbalta and that he needs to use the Xanax only 2 or 3 times a week -We will attempt to decrease the number of Xanax comes to give him on his follow-up appointment to 15

## 2018-05-03 NOTE — Assessment & Plan Note (Signed)
BP Readings from Last 3 Encounters:  05/03/18 135/87  02/02/18 139/76  01/19/18 (!) 126/59    Lab Results  Component Value Date   NA 135 01/19/2018   K 4.1 01/19/2018   CREATININE 1.29 (H) 01/19/2018    Assessment: Blood pressure control:  Well-controlled Progress toward BP goal:   At goal Comments: Patient is compliant with carvedilol 3.125 mg twice daily, terazosin 2 mg nightly and lisinopril/HCTZ 20/25 mg  Plan: Medications:  continue current medications Educational resources provided:   Self management tools provided:   Other plans: We will check BMP today

## 2018-05-03 NOTE — Assessment & Plan Note (Signed)
-  This problem is chronic and stable -Patient does complain of worsening pain in his joints especially his ankles -He will make an appointment to follow-up with his rheumatologist -We will continue with leflunomide and folic acid for now -We will continue with Norco as needed for his pain

## 2018-05-03 NOTE — Assessment & Plan Note (Signed)
-  This problem is chronic and stable -Patient states that he uses his albuterol only sparingly -We will continue with albuterol as needed for now -Strongly encourage smoking cessation

## 2018-05-03 NOTE — Assessment & Plan Note (Signed)
-  This problem is chronic and stable -We will continue with ranitidine 150 mg twice daily -Patient states his symptoms are well controlled on this but he occasionally needs to use a Tums -No further work-up at this time

## 2018-05-03 NOTE — Assessment & Plan Note (Signed)
-  This problem is chronic and stable -Patient is on Norco chronically for his joint pain -We will check a UDS today -[employer] narcotic database reviewed.  No red flags noted -The benefits of continuing him on this medication currently outweigh the risks.  Patient is able to go to work and do his daily activities on his medication

## 2018-05-03 NOTE — Assessment & Plan Note (Signed)
Lab Results  Component Value Date   HGBA1C 8.9 (A) 05/03/2018   HGBA1C 6.6 07/06/2017   HGBA1C 10.0 (H) 02/26/2017     Assessment: Diabetes control:  Poorly controlled Progress toward A1C goal:   Deteriorated Comments: Patient is compliant with metformin 1000 mg twice daily as well as Jardiance 10 mg daily and Novolin N 70 units in the morning 24 units in the evening.  Plan: Medications:  Will consider increasing his Jardiance to 25 mg if his BMP is within normal limits Home glucose monitoring: Frequency:   Timing:   Instruction/counseling given: reminded to get eye exam and reminded to bring blood glucose meter & log to each visit Educational resources provided:   Self management tools provided:   Other plans: We will check BMP today.  Patient wants referral to endocrinology for possible placement of insulin pump.  Will refer patient to endocrinology today.

## 2018-05-03 NOTE — Assessment & Plan Note (Signed)
-  Patient states that he had quit smoking for 3 months but started again -He is still smoking 1 pack/day -He does not want any medications at this time to help him quit but states that he will try to quit on his own -Strongly encourage smoking cessation again today

## 2018-05-03 NOTE — Assessment & Plan Note (Signed)
-  This problem is chronic and stable -Patient's last CD4 count was 700 and his last viral load was 39 -We will continue with antiretroviral treatment per ID -ID follow-up and management appreciated

## 2018-05-03 NOTE — Patient Instructions (Signed)
-  It was a pleasure seeing you today -Please use your wrist splint at night.  If you do not feel better please give me a call and I will refer you for nerve conduction studies -Please try and stop smoking -Please follow-up with your rheumatologist -Please make an appointment with the eye doctor -We will check some blood work on you today -We will check a urine test on you today -I will increase her Jardiance to 25 mg if your kidney function looks good

## 2018-05-03 NOTE — Assessment & Plan Note (Signed)
-  Patient states that he has been having issues maintaining erections and was prescribed Viagra by his infectious disease doctor -He was unable to afford this and has not taken this medication -We will address this in detail on his follow-up visit

## 2018-05-04 ENCOUNTER — Encounter: Payer: Self-pay | Admitting: Internal Medicine

## 2018-05-04 LAB — BMP8+ANION GAP
ANION GAP: 17 mmol/L (ref 10.0–18.0)
BUN/Creatinine Ratio: 19 (ref 10–24)
BUN: 22 mg/dL (ref 8–27)
CHLORIDE: 94 mmol/L — AB (ref 96–106)
CO2: 24 mmol/L (ref 20–29)
Calcium: 10.1 mg/dL (ref 8.6–10.2)
Creatinine, Ser: 1.13 mg/dL (ref 0.76–1.27)
GFR, EST AFRICAN AMERICAN: 80 mL/min/{1.73_m2} (ref >59)
GFR, EST NON AFRICAN AMERICAN: 69 mL/min/{1.73_m2} (ref >59)
GLUCOSE: 155 mg/dL — AB (ref 65–99)
POTASSIUM: 4.4 mmol/L (ref 3.5–5.2)
SODIUM: 135 mmol/L (ref 134–144)

## 2018-05-08 LAB — TOXASSURE SELECT,+ANTIDEPR,UR

## 2018-05-16 ENCOUNTER — Other Ambulatory Visit: Payer: Self-pay | Admitting: Cardiovascular Disease

## 2018-05-29 ENCOUNTER — Other Ambulatory Visit: Payer: Self-pay | Admitting: Internal Medicine

## 2018-05-29 DIAGNOSIS — F411 Generalized anxiety disorder: Secondary | ICD-10-CM

## 2018-05-29 DIAGNOSIS — Z79891 Long term (current) use of opiate analgesic: Secondary | ICD-10-CM

## 2018-05-31 MED ORDER — ALPRAZOLAM 0.5 MG PO TABS: 0.5000 mg | ORAL_TABLET | Freq: Every evening | ORAL | 0 refills | Status: DC | PRN

## 2018-05-31 MED ORDER — HYDROCODONE-ACETAMINOPHEN 7.5-325 MG PO TABS: 1.0000 | ORAL_TABLET | Freq: Four times a day (QID) | ORAL | 0 refills | Status: DC | PRN

## 2018-05-31 NOTE — Telephone Encounter (Signed)
Last rx written 8/2 Hydrocodone Last OV  05/03/18. UDS  05/03/18.

## 2018-06-07 ENCOUNTER — Encounter: Payer: Self-pay | Admitting: Internal Medicine

## 2018-06-13 ENCOUNTER — Ambulatory Visit (INDEPENDENT_AMBULATORY_CARE_PROVIDER_SITE_OTHER): Payer: Medicare HMO | Admitting: Endocrinology

## 2018-06-13 ENCOUNTER — Encounter: Payer: Self-pay | Admitting: Endocrinology

## 2018-06-13 VITALS — BP 132/76 | HR 65 | Ht 70.0 in | Wt 194.2 lb

## 2018-06-13 DIAGNOSIS — Z794 Long term (current) use of insulin: Secondary | ICD-10-CM

## 2018-06-13 DIAGNOSIS — E1169 Type 2 diabetes mellitus with other specified complication: Secondary | ICD-10-CM | POA: Diagnosis not present

## 2018-06-13 MED ORDER — INSULIN NPH (HUMAN) (ISOPHANE) 100 UNIT/ML ~~LOC~~ SUSP: 30.0000 [IU] | Freq: Two times a day (BID) | SUBCUTANEOUS | 3 refills | Status: DC

## 2018-06-13 NOTE — Patient Instructions (Addendum)
good diet and exercise significantly improve the control of your diabetes.  please let me know if you wish to be referred to a dietician.  high blood sugar is very risky to your health.  you should see an eye doctor and dentist every year.  It is very important to get all recommended vaccinations.  Controlling your blood pressure and cholesterol drastically reduces the damage diabetes does to your body.  Those who smoke should quit.  Please discuss these with your doctor.  check your blood sugar twice a day.  vary the time of day when you check, between before the 3 meals, and at bedtime.  also check if you have symptoms of your blood sugar being too high or too low.  please keep a record of the readings and bring it to your next appointment here (or you can bring the meter itself).  You can write it on any piece of paper.  please call us sooner if your blood sugar goes below 70, or if you have a lot of readings over 200.   For now, please increase the insulin to 30 units twice a day.  However, if you are going to be active, take just 15 units that morning.   Please continue the same other diabetes medications. Please see Vaughan Basta, to ask about a V-GO device. Please come back for a follow-up appointment in 6 weeks.

## 2018-06-13 NOTE — Progress Notes (Signed)
Subjective:    Patient ID: Riley Jones, male    DOB: May 17, 1956, 62 y.o.   MRN: [id number]  HPI pt is referred by Dr Lysbeth Galas, for diabetes.  Pt states DM was dx'ed in 2004, when he presented with nonketotic hyperosmolar hyperglycemic state; he has moderate neuropathy of the lower extremities, and associated CAD; he has been on insulin since soon after dx; pt says his diet and exercise are good; he has never had pancreatitis, pancreatic surgery, severe hypoglycemia or DKA.  He takes NPH (17 units qam and 27 units qpm), and 2 oral meds.  He says cbg's vary from 182-300's.  He seldom has hypoglycemia.  This happens with exercise.  He takes human insulin, due to cost.   Past Medical History:  Diagnosis Date  . Anxiety   . Arthritis    "pretty much all over"  . Asthma    "mild" (07/17/2014)  . Atypical angina ([facility])    Myoview 2003, Normal EF 64%  . CAD (coronary artery disease)    a. 10/20 DES to 80% mid LAD  . COPD (chronic obstructive pulmonary disease) ([facility])    "mild" (07/17/2014)  . Depression   . Foot pain 03/30/2011  . GERD (gastroesophageal reflux disease)   . H/O hiatal hernia   . Headache    "weekly" (06/2014)  . History of gout   . HIV (human immunodeficiency virus infection) ([facility]) 1995   DX after shingles followed by Dr. Ola Spurr ([facility])  . Hx of cardiovascular stress test    ETT-Myoview (3/16):  Ex 10:15, No Ischemia, EF 57%; Normal Study  . Hyperlipidemia   . Hypertension    Well Controlled off meds  . Kidney stones    "passed them all"  . Pneumonia 4-5 times  . Rheumatoid arthritis(714.0)    Followed by Dr. Charlestine Night, off MTX and prdnisone since 2007  . Sleep apnea    does not wear mask (07/17/2014)  . Type II diabetes mellitus ([facility])     Past Surgical History:  Procedure Laterality Date  . APPENDECTOMY  ~ 1981  . CARPAL TUNNEL RELEASE Left 1990's  . CORONARY ANGIOPLASTY WITH STENT PLACEMENT  07/17/2014   a. DES to 80% mid LAD  . HERNIA REPAIR    .  LAPAROSCOPIC INCISIONAL / UMBILICAL / VENTRAL HERNIA REPAIR  1990's   "it was a double; not inguinal"  . LEFT HEART CATHETERIZATION WITH CORONARY ANGIOGRAM N/A 07/17/2014   Procedure: LEFT HEART CATHETERIZATION WITH CORONARY ANGIOGRAM;  Surgeon: [name], MD;  Location: [facility] CATH LAB;  Service: Cardiovascular;  Laterality: N/A;  . PERCUTANEOUS STENT INTERVENTION  07/17/2014   Procedure: PERCUTANEOUS STENT INTERVENTION;  Surgeon: [name], MD;  Location: [facility] CATH LAB;  Service: Cardiovascular;;  . TONSILLECTOMY AND ADENOIDECTOMY  1990's  . TRIGGER FINGER RELEASE Left 1990's   2nd & 5th digits  . UMBILICAL HERNIA REPAIR  1980's   w/mesh  . UVULOPALATOPHARYNGOPLASTY (UPPP)/TONSILLECTOMY/SEPTOPLASTY  1990's    Social History   Socioeconomic History  . Marital status: Single    Spouse name: Not on file  . Number of children: Not on file  . Years of education: 55  . Highest education level: Not on file  Occupational History  . Occupation: [employer]  . Occupation: Truck Geophysicist/field seismologist (retired)  Social Needs  . Financial resource strain: Not on file  . Food insecurity:    Worry: Not on file    Inability: Not on file  . Transportation needs:  Medical: Not on file    Non-medical: Not on file  Tobacco Use  . Smoking status: Current Every Day Smoker    Packs/day: 1.00    Years: 46.00    Pack years: 46.00    Types: Cigarettes  . Smokeless tobacco: Never Used  . Tobacco comment: Thinking about  Substance and Sexual Activity  . Alcohol use: Yes    Alcohol/week: 1.0 standard drinks    Types: 1 Glasses of wine per week    Comment: 07/17/2014 "glass of wine once or twice/month"  . Drug use: No  . Sexual activity: Not Currently    Partners: Male    Comment: given condoms  Lifestyle  . Physical activity:    Days per week: Not on file    Minutes per session: Not on file  . Stress: Not on file  Relationships  . Social connections:    Talks on phone: Not on file    Gets together: Not  on file    Attends religious service: Not on file    Active member of club or organization: Not on file    Attends meetings of clubs or organizations: Not on file    Relationship status: Not on file  . Intimate partner violence:    Fear of current or ex partner: Not on file    Emotionally abused: Not on file    Physically abused: Not on file    Forced sexual activity: Not on file  Other Topics Concern  . Not on file  Social History Narrative   [place] approved beginning 12/11/2009 - 12/27/2010   [employer] [place] benefits approved; patient eligible for 100% discount for out patient labs and office visits. Patient eligible for 70% discount for other services per Irish Elders 09/08/2010      Patient is on disability, and walks 2-3 times per week.     Current Outpatient Medications on File Prior to Visit  Medication Sig Dispense Refill  . albuterol (VENTOLIN HFA) 108 (90 BASE) MCG/ACT inhaler Inhale 1 puff into the lungs every 4 (four) hours as needed. for shortness of breath. Or may use every 6 hours as needed.    . ALPRAZolam (XANAX) 0.5 MG tablet Take 1 tablet (0.5 mg total) by mouth at bedtime as needed for anxiety. 20 tablet 0  . aspirin 81 MG EC tablet Take 81 mg by mouth daily.      Marland Kitchen atorvastatin (LIPITOR) 40 MG tablet TAKE 1 TABLET BY MOUTH DAILY 90 tablet 0  . bictegravir-emtricitabine-tenofovir AF (BIKTARVY) 50-200-25 MG TABS tablet Take 1 tablet daily by mouth. 90 tablet 3  . carvedilol (COREG) 3.125 MG tablet TAKE 1 TABLET BY MOUTH TWICE DAILY WITH A MEAL 180 tablet 1  . clotrimazole-betamethasone (LOTRISONE) cream Apply 1 application 2 (two) times daily topically. 30 g 0  . fluticasone (FLONASE) 50 MCG/ACT nasal spray Place 1 spray into both nostrils daily. 16 g 0  . folic acid (FOLVITE) 1 MG tablet TAKE 1 TABLET BY MOUTH DAILY 90 tablet 3  . glucose blood (ACCU-CHEK SMARTVIEW) test strip CHECK BLOOD SUGAR 4 TIMES A DAY 60 each 6  . HYDROcodone-acetaminophen (NORCO) 7.5-325 MG  tablet Take 1 tablet by mouth every 6 (six) hours as needed for moderate pain. 60 tablet 0  . Insulin Pen Needle (B-D UF III MINI PEN NEEDLES) 31G X 5 MM MISC Inject victoza once daily 100 each 3  . JARDIANCE 10 MG TABS tablet TAKE 1 TABLET BY MOUTH ONCE DAILY 90 tablet 3  .  lisinopril-hydrochlorothiazide (PRINZIDE,ZESTORETIC) 20-25 MG tablet TAKE 1 TABLET EVERY DAY 90 tablet 1  . loratadine (CLARITIN) 10 MG tablet Take 1 tablet (10 mg total) by mouth daily. 90 tablet 3  . metFORMIN (GLUCOPHAGE) 1000 MG tablet TAKE ONE TABLET BY MOUTH TWICE DAILY WITH A MEAL 180 tablet 1  . nitroGLYCERIN (NITROSTAT) 0.4 MG SL tablet PLACE 1 TABLET UNDER THE TONGUE EVERY 5 MINUTES AS NEEDED FOR CHEST PAIN 25 tablet 0  . ranitidine (ZANTAC) 150 MG tablet TAKE 1 TABLET BY MOUTH TWICE DAILY 180 tablet 3  . terazosin (HYTRIN) 2 MG capsule TAKE 1 CAPSULE BY MOUTH AT BEDTIME 90 capsule 1   No current facility-administered medications on file prior to visit.     Allergies  Allergen Reactions  . Penicillins Hives, Nausea Only and Rash    Has patient had a PCN reaction causing immediate rash, facial/tongue/throat swelling, SOB or lightheadedness with hypotension: Yes Has patient had a PCN reaction causing severe rash involving mucus membranes or skin necrosis: No Has patient had a PCN reaction that required hospitalization: No Has patient had a PCN reaction occurring within the last 10 years: No If all of the above answers are "NO", then may proceed with Cephalosporin use.   Marland Kitchen Zoloft [Sertraline Hcl] Other (See Comments)    Patient reported Psychomotor slowing / worsened depression  . Victoza [Liraglutide]     Did not tolerate this medication.  Complained of dizziness even at the lowest dose  . Chantix [Varenicline] Hives    Family History  Problem Relation Age of Onset  . Osteoporosis Mother   . Diabetes Mother   . Heart disease Father   . Heart attack Father   . Diabetes Father   . Heart attack Brother     . Stroke Maternal Grandmother   . Stroke Maternal Grandfather   . Stroke Paternal Grandmother   . Stroke Paternal Grandfather     BP 132/76 (BP Location: Right Arm)   Pulse 65   Ht 5\' 10"  (1.778 m)   Wt 194 lb 3.2 oz (88.1 kg)   SpO2 95%   BMI 27.86 kg/m   Review of Systems denies weight loss, blurry vision, headache, chest pain, n/v, excessive diaphoresis, memory loss, and cold intolerance.  No change in chronic DOE.  He has frequent urination, rhinorrhea, easy bruising, depression, and leg cramps.      Objective:   Physical Exam VS: see vs page GEN: no distress HEAD: head: no deformity eyes: no periorbital swelling, no proptosis external nose and ears are normal mouth: no lesion seen.  NECK: supple, thyroid is not enlarged CHEST WALL: no deformity LUNGS: clear to auscultation CV: reg rate and rhythm, no murmur.  ABD: abdomen is soft, nontender.  no hepatosplenomegaly.  not distended.  Self-reducing ventral hernia.   MUSCULOSKELETAL: muscle bulk and strength are grossly normal.  no obvious joint swelling.  gait is normal and steady EXTEMITIES: no deformity.  no ulcer on the feet.  feet are of normal color and temp.  no edema.  PULSES: dorsalis pedis intact bilat.  no carotid bruit NEURO:  cn 2-12 grossly intact.   readily moves all 4's.  sensation is intact to touch on the feet SKIN:  Normal texture and temperature.  No rash or suspicious lesion is visible.  Few ecchymoses of the forearms.   NODES:  None palpable at the neck.   PSYCH: alert, well-oriented.  Does not appear anxious nor depressed.     Lab Results  Component  Value Date   HGBA1C 8.9 (A) 05/03/2018   Lab Results  Component Value Date   CREATININE 1.13 05/03/2018   BUN 22 05/03/2018   NA 135 05/03/2018   K 4.4 05/03/2018   CL 94 (L) 05/03/2018   CO2 24 05/03/2018   I have reviewed outside records, and summarized: Pt was noted to have elevated a1c, and referred here.  Other probs addressed were wrist  pain and HTN      Assessment & Plan:  Insulin-requiring type 2 DM, with CAD, new to me: she needs increased rx.   Patient Instructions  good diet and exercise significantly improve the control of your diabetes.  please let me know if you wish to be referred to a dietician.  high blood sugar is very risky to your health.  you should see an eye doctor and dentist every year.  It is very important to get all recommended vaccinations.  Controlling your blood pressure and cholesterol drastically reduces the damage diabetes does to your body.  Those who smoke should quit.  Please discuss these with your doctor.  check your blood sugar twice a day.  vary the time of day when you check, between before the 3 meals, and at bedtime.  also check if you have symptoms of your blood sugar being too high or too low.  please keep a record of the readings and bring it to your next appointment here (or you can bring the meter itself).  You can write it on any piece of paper.  please call us sooner if your blood sugar goes below 70, or if you have a lot of readings over 200.   For now, please increase the insulin to 30 units twice a day.  However, if you are going to be active, take just 15 units that morning.   Please continue the same other diabetes medications. Please see Vaughan Basta, to ask about a V-GO device. Please come back for a follow-up appointment in 6 weeks.

## 2018-06-14 ENCOUNTER — Other Ambulatory Visit: Payer: Self-pay | Admitting: Cardiovascular Disease

## 2018-06-22 ENCOUNTER — Other Ambulatory Visit: Payer: Self-pay | Admitting: Internal Medicine

## 2018-06-22 MED ORDER — ATORVASTATIN CALCIUM 40 MG PO TABS: 40.0000 mg | ORAL_TABLET | Freq: Every day | ORAL | 1 refills | Status: DC

## 2018-06-27 ENCOUNTER — Other Ambulatory Visit: Payer: Self-pay | Admitting: *Deleted

## 2018-06-27 MED ORDER — GLUCOSE BLOOD VI STRP: ORAL_STRIP | 1 refills | Status: DC

## 2018-06-28 ENCOUNTER — Other Ambulatory Visit: Payer: Self-pay | Admitting: Internal Medicine

## 2018-06-28 DIAGNOSIS — Z79891 Long term (current) use of opiate analgesic: Secondary | ICD-10-CM

## 2018-06-28 DIAGNOSIS — F411 Generalized anxiety disorder: Secondary | ICD-10-CM

## 2018-06-29 MED ORDER — ALPRAZOLAM 0.5 MG PO TABS: 0.5000 mg | ORAL_TABLET | Freq: Every evening | ORAL | 0 refills | Status: DC | PRN

## 2018-06-29 MED ORDER — HYDROCODONE-ACETAMINOPHEN 7.5-325 MG PO TABS: 1.0000 | ORAL_TABLET | Freq: Four times a day (QID) | ORAL | 0 refills | Status: DC | PRN

## 2018-07-04 ENCOUNTER — Ambulatory Visit (INDEPENDENT_AMBULATORY_CARE_PROVIDER_SITE_OTHER): Payer: Medicare HMO | Admitting: Pharmacist

## 2018-07-04 ENCOUNTER — Other Ambulatory Visit: Payer: Self-pay | Admitting: Pharmacist

## 2018-07-04 DIAGNOSIS — E1169 Type 2 diabetes mellitus with other specified complication: Secondary | ICD-10-CM | POA: Diagnosis not present

## 2018-07-04 DIAGNOSIS — Z0000 Encounter for general adult medical examination without abnormal findings: Secondary | ICD-10-CM

## 2018-07-04 DIAGNOSIS — Z794 Long term (current) use of insulin: Secondary | ICD-10-CM

## 2018-07-04 NOTE — Progress Notes (Signed)
Documentation for Freestyle Libre Pro Continuous glucose monitoring Freestyle Libre Pro CGM sensor placed and started on Riley Jones who was identified by name and date of birth.  Patient was educated about wearing sensor, keeping food, activity and medication log and when to call office.She was educated about how to care for the sensor and not to have an MRI, CT or Diathermy while wearing the sensor. Follow up was arranged with the patient for 07/11/2018.   Lot #:[id number] Serial #:[id number] Expiration Date:12/27/2018 Kathyrn Sheriff, Student-PharmD 07/04/2018 11:22 AM.

## 2018-07-05 ENCOUNTER — Other Ambulatory Visit: Payer: Self-pay | Admitting: Cardiovascular Disease

## 2018-07-05 ENCOUNTER — Ambulatory Visit: Payer: Medicare HMO | Admitting: Cardiology

## 2018-07-05 MED ORDER — CLOPIDOGREL BISULFATE 75 MG PO TABS: 75.0000 mg | ORAL_TABLET | Freq: Every day | ORAL | 0 refills | Status: DC

## 2018-07-05 NOTE — Telephone Encounter (Signed)
° ° ° °*  STAT* If patient is at the pharmacy, call can be transferred to refill team.   1. Which medications need to be refilled? (please list name of each medication and dose if known) clopidogrel (PLAVIX) 75 MG tablet  2. Which pharmacy/location (including street and city if local pharmacy) is medication to be sent to? [place], [place] - [address]  3. Do they need a 30 day or 90 day supply? [place]

## 2018-07-05 NOTE — Telephone Encounter (Signed)
Pt's medication was sent to pt's pharmacy as requested. Confirmation received.  °

## 2018-07-06 NOTE — Patient Instructions (Signed)
Please record the time, amount and what food drinks and activities you have while wearing the continuous glucose monitor(CGM) in the folder provided.  Bring the folder with you to follow up appointments  Do not have a CT or an MRI while wearing the CGM.   Please make an appointment for 1 week with me and a doctor for the first of two CGM downloads..   You will also return in 2 weeks to have your second download and the CGM removed.  

## 2018-07-06 NOTE — Addendum Note (Signed)
Addended by: Forde Dandy on: 07/06/2018 01:36 PM   Modules accepted: Orders

## 2018-07-11 ENCOUNTER — Ambulatory Visit: Payer: Medicare HMO | Admitting: Pharmacist

## 2018-07-11 ENCOUNTER — Encounter: Payer: Self-pay | Admitting: Internal Medicine

## 2018-07-11 ENCOUNTER — Encounter: Payer: Self-pay | Admitting: Pharmacist

## 2018-07-11 ENCOUNTER — Ambulatory Visit (INDEPENDENT_AMBULATORY_CARE_PROVIDER_SITE_OTHER): Payer: Medicare HMO | Admitting: Internal Medicine

## 2018-07-11 VITALS — BP 126/63 | HR 62 | Temp 97.9°F

## 2018-07-11 VITALS — BP 133/63 | Temp 97.9°F | Ht 70.0 in | Wt 200.6 lb

## 2018-07-11 DIAGNOSIS — M79672 Pain in left foot: Secondary | ICD-10-CM | POA: Diagnosis not present

## 2018-07-11 DIAGNOSIS — E1169 Type 2 diabetes mellitus with other specified complication: Secondary | ICD-10-CM

## 2018-07-11 DIAGNOSIS — Z794 Long term (current) use of insulin: Principal | ICD-10-CM

## 2018-07-11 DIAGNOSIS — G5601 Carpal tunnel syndrome, right upper limb: Secondary | ICD-10-CM

## 2018-07-11 DIAGNOSIS — M79671 Pain in right foot: Secondary | ICD-10-CM

## 2018-07-11 MED ORDER — DULAGLUTIDE 0.75 MG/0.5ML ~~LOC~~ SOAJ: 0.7500 mg | SUBCUTANEOUS | 0 refills | Status: DC

## 2018-07-11 NOTE — Progress Notes (Signed)
   CC: DM follow-up   HPI:  [name] is a 63 y.o. gentleman with PMHx listed below here for follow-up on DM after having CGM placed last week.  He wore the CGM for 7 days. The average reading was 193, % time in target was 50, % time below target was 2, and % time above target was 48. Intervention will be to decrease Novolin to 25 units BID and start Ozempic 0.25 mg weekly. The patient will be scheduled to see [facility] and Dr. Maudie Jones for second CGM download.    Past Medical History:  Diagnosis Date  . Anxiety   . Arthritis    "pretty much all over"  . Asthma    "mild" (07/17/2014)  . Atypical angina ([place])    Myoview 2003, Normal EF 64%  . CAD (coronary artery disease)    a. 10/20 DES to 80% mid LAD  . COPD (chronic obstructive pulmonary disease) ([place])    "mild" (07/17/2014)  . Depression   . Foot pain 03/30/2011  . GERD (gastroesophageal reflux disease)   . H/O hiatal hernia   . Headache    "weekly" (06/2014)  . History of gout   . HIV (human immunodeficiency virus infection) ([place]) 1995   DX after shingles followed by Dr. Ola Jones ([facility])  . Hx of cardiovascular stress test    ETT-Myoview (3/16):  Ex 10:15, No Ischemia, EF 57%; Normal Study  . Hyperlipidemia   . Hypertension    Well Controlled off meds  . Kidney stones    "passed them all"  . Pneumonia 4-5 times  . Rheumatoid arthritis(714.0)    Followed by Dr. Charlestine Jones, off MTX and prdnisone since 2007  . Sleep apnea    does not wear mask (07/17/2014)  . Type II diabetes mellitus ([facility])    Review of Systems:  General: no fevers, chills CV: no chest pain or palpitations Neuro: pain in bilateral feet; also complains of R hand pain with known carpal tunnel    Physical Exam:  Vitals:   07/11/18 1043  BP: 133/63  Temp: 97.9 F (36.6 C)  TempSrc: Oral  SpO2: 97%  Weight: 200 lb 9.6 oz (91 kg)  Height: 5\' 10"  (1.778 m)   General: alert, pleasant male, appears stated age, in NAD CV: RRR; no murmurs, rubs or  gallops Pulmonary: normal respiratory effort; lungs CTA bilaterally Psych: normal mood and affect   Assessment & Plan:   See Encounters Tab for problem based charting.  Patient seen with Dr. Daryll Jones

## 2018-07-11 NOTE — Assessment & Plan Note (Signed)
This is Riley Jones's visit for first CGM download. He is having some symptomatic hypoglycemia at night and when he has a long, strenuous day at work. We will decrease Novolin to 25 units BID. Starting him on Ozempic 0.25 weekly. Follow-up in 1 week for second CGM download.   Complains of neuropathic pain in bilateral feet. He states he has been on Gabapentin in the past, but has not been on it for several years. Cannot find reason for discontinuation upon record review. Will consider resuming this at next visit.

## 2018-07-11 NOTE — Progress Notes (Addendum)
Patient was seen today in a co-visit with Dr. Koleen Distance. Endocrinologist, Dr. Loanne Drilling is aware of CGM QI intern project.  See documentation under Dr. Janne Napoleon visit for details.

## 2018-07-11 NOTE — Patient Instructions (Addendum)
Riley Jones, It was a pleasure meeting you! You were given samples of Ozempic 0.25 mg to start taking once a week. We are also decreasing your Insulin to 25 units twice daily to try to prevent low blood sugars.   Keep up the great work with your diet!   Please return in 7 days with [facility] & Dr Maudie Mercury for [facility] #2.

## 2018-07-16 NOTE — Progress Notes (Signed)
Cardiology Office Note   Date:  07/18/2018   ID:  Riley Jones, DOB October 01, 1955, MRN [id number]  PCP:  Riley Contes, MD  Cardiologist: Dr. Jenkins Rouge, MD   Chief Complaint  Patient presents with  . Coronary Artery Disease  . Hypertension  . Nicotine Dependence  . Hyperlipidemia   History of Present Illness: Riley Jones is a 62 y.o. male who presents for one year follow up, seen for Riley Jones. Riley Jones has a past medical history that includes CAD with LAD stent in 2015, ongoing tobacco use, DM-2, HIV, RA, HLD, HTN and chronic pain. He was last seen in the office 03/11/18 for follow up after a hospitalization for chest pain. During that time he had mildly elevated troponin at 0.03>0.04. His EKG showed RBBB with non-specific T wave abnormalities. Chest CT on 12/09/2016 demonstrated aortic atherosclerosis of the left main, LAD, LCX and RCA. There was also calcifications of the aortic and mitral annulus. Echocardiogram performed 02/28/2017 with LVEF of 60-65% with NWMA. He underwent a Lexiscan myoview performed 03/10/2017 with LVEF estimated at 53%, considered a normal, low risk study with no signs of ischemia or infarct.   During his last office visit, he was doing relatively well. He had some complaints of mild discomfort, thought to be GI in nature. He continued to smoke cigarettes and was encouraged to stop.   Today, he denies chest pain, palpitations, SOB, fatigue, orthopnea symptoms, LE swelling. He continues to stay active with work, working 5-6 days per week at [employer]. He states that his biggest concern right now seems to be controlling his DM. He is being followed by internal medicine OP service and is in the process of receiving an insulin pump. He continues to smoke, however has cut down to 1PPD. Overall doing well.   Past Medical History:  Diagnosis Date  . Anxiety   . Arthritis    "pretty much all over"  . Asthma    "mild" (07/17/2014)  . Atypical angina  ([place])    Myoview 2003, Normal EF 64%  . CAD (coronary artery disease)    a. 10/20 DES to 80% mid LAD  . COPD (chronic obstructive pulmonary disease) ([place])    "mild" (07/17/2014)  . Depression   . Foot pain 03/30/2011  . GERD (gastroesophageal reflux disease)   . H/O hiatal hernia   . Headache    "weekly" (06/2014)  . History of gout   . HIV (human immunodeficiency virus infection) ([place]) 1995   DX after shingles followed by Dr. Ola Spurr (ID)  . Hx of cardiovascular stress test    ETT-Myoview (3/16):  Ex 10:15, No Ischemia, EF 57%; Normal Study  . Hyperlipidemia   . Hypertension    Well Controlled off meds  . Kidney stones    "passed them all"  . Pneumonia 4-5 times  . Rheumatoid arthritis(714.0)    Followed by Dr. Charlestine Night, off MTX and prdnisone since 2007  . Sleep apnea    does not wear mask (07/17/2014)  . Type II diabetes mellitus ([place])     Past Surgical History:  Procedure Laterality Date  . APPENDECTOMY  ~ 1981  . CARPAL TUNNEL RELEASE Left 1990's  . CORONARY ANGIOPLASTY WITH STENT PLACEMENT  07/17/2014   a. DES to 80% mid LAD  . HERNIA REPAIR    . LAPAROSCOPIC INCISIONAL / UMBILICAL / VENTRAL HERNIA REPAIR  1990's   "it was a double; not inguinal"  . LEFT HEART CATHETERIZATION WITH CORONARY  ANGIOGRAM N/A 07/17/2014   Procedure: LEFT HEART CATHETERIZATION WITH CORONARY ANGIOGRAM;  Surgeon: [name], MD;  Location: [facility] CATH LAB;  Service: Cardiovascular;  Laterality: N/A;  . PERCUTANEOUS STENT INTERVENTION  07/17/2014   Procedure: PERCUTANEOUS STENT INTERVENTION;  Surgeon: [name], MD;  Location: [facility] CATH LAB;  Service: Cardiovascular;;  . TONSILLECTOMY AND ADENOIDECTOMY  1990's  . TRIGGER FINGER RELEASE Left 1990's   2nd & 5th digits  . UMBILICAL HERNIA REPAIR  1980's   w/mesh  . UVULOPALATOPHARYNGOPLASTY (UPPP)/TONSILLECTOMY/SEPTOPLASTY  1990's     Current Outpatient Medications  Medication Sig Dispense Refill  . albuterol (VENTOLIN HFA) 108 (90  BASE) MCG/ACT inhaler Inhale 1 puff into the lungs every 4 (four) hours as needed. for shortness of breath. Or may use every 6 hours as needed.    . ALPRAZolam (XANAX) 0.5 MG tablet Take 1 tablet (0.5 mg total) by mouth at bedtime as needed for anxiety. 20 tablet 0  . aspirin 81 MG EC tablet Take 81 mg by mouth daily.      Marland Kitchen atorvastatin (LIPITOR) 40 MG tablet Take 1 tablet (40 mg total) by mouth daily. 90 tablet 1  . bictegravir-emtricitabine-tenofovir AF (BIKTARVY) 50-200-25 MG TABS tablet Take 1 tablet daily by mouth. 90 tablet 3  . carvedilol (COREG) 3.125 MG tablet TAKE 1 TABLET BY MOUTH TWICE DAILY WITH A MEAL 180 tablet 1  . clopidogrel (PLAVIX) 75 MG tablet Take 1 tablet (75 mg total) by mouth daily. 90 tablet 2  . clotrimazole-betamethasone (LOTRISONE) cream Apply 1 application 2 (two) times daily topically. 30 g 0  . fluticasone (FLONASE) 50 MCG/ACT nasal spray Place 1 spray into both nostrils daily. 16 g 0  . folic acid (FOLVITE) 1 MG tablet TAKE 1 TABLET BY MOUTH DAILY 90 tablet 3  . glucose blood (ACCU-CHEK SMARTVIEW) test strip CHECK BLOOD SUGAR 4 TIMES A DAY. diag code E11.69. Insulin dependent 360 each 1  . HYDROcodone-acetaminophen (NORCO) 7.5-325 MG tablet Take 1 tablet by mouth every 6 (six) hours as needed for moderate pain. 60 tablet 0  . insulin NPH Human (NOVOLIN N RELION) 100 UNIT/ML injection Inject 0.3 mLs (30 Units total) into the skin 2 (two) times daily. And syringes 2/day 20 mL 3  . Insulin Pen Needle (B-D UF III MINI PEN NEEDLES) 31G X 5 MM MISC Inject victoza once daily 100 each 3  . JARDIANCE 10 MG TABS tablet TAKE 1 TABLET BY MOUTH ONCE DAILY 90 tablet 3  . lisinopril-hydrochlorothiazide (PRINZIDE,ZESTORETIC) 20-25 MG tablet TAKE 1 TABLET EVERY DAY 90 tablet 1  . loratadine (CLARITIN) 10 MG tablet Take 1 tablet (10 mg total) by mouth daily. 90 tablet 3  . metFORMIN (GLUCOPHAGE) 1000 MG tablet TAKE ONE TABLET BY MOUTH TWICE DAILY WITH A MEAL 180 tablet 1  .  nitroGLYCERIN (NITROSTAT) 0.4 MG SL tablet PLACE 1 TABLET UNDER THE TONGUE EVERY 5 MINUTES AS NEEDED FOR CHEST PAIN 25 tablet 0  . ranitidine (ZANTAC) 150 MG tablet TAKE 1 TABLET BY MOUTH TWICE DAILY 180 tablet 3  . Semaglutide,0.25 or 0.5MG /DOS, (OZEMPIC, 0.25 OR 0.5 MG/DOSE,) 2 MG/1.5ML SOPN Inject 0.5 mg into the skin once a week.    . terazosin (HYTRIN) 2 MG capsule TAKE 1 CAPSULE BY MOUTH AT BEDTIME 90 capsule 1   No current facility-administered medications for this visit.     Allergies:   Penicillins; Zoloft [sertraline hcl]; Victoza [liraglutide]; and Chantix [varenicline]    Social History:  The patient  reports that  he has been smoking cigarettes. He has a 46.00 pack-year smoking history. He has never used smokeless tobacco. He reports that he drinks about 1.0 standard drinks of alcohol per week. He reports that he does not use drugs.   Family History:  The patient's family history includes Diabetes in his father and mother; Heart attack in his brother and father; Heart disease in his father; Osteoporosis in his mother; Stroke in his maternal grandfather, maternal grandmother, paternal grandfather, and paternal grandmother.    ROS:  Please see the history of present illness.   Otherwise, review of systems are positive for none.   All other systems are reviewed and negative.    PHYSICAL EXAM: VS:  BP 128/70   Pulse (!) 58   Ht 5\' 10"  (1.778 m)   Wt 197 lb 12.8 oz (89.7 kg)   SpO2 99%   BMI 28.38 kg/m  , BMI Body mass index is 28.38 kg/m.  General: Well developed, well nourished, NAD Skin: Warm, dry, intact  Head: Normocephalic, atraumatic, clear, moist mucus membranes. Neck: Negative for carotid bruits. No JVD Lungs:Clear to ausculation bilaterally. No wheezes, rales, or rhonchi. Breathing is unlabored. Cardiovascular: RRR with S1 S2. No murmurs, rubs, gallops, or LV heave appreciated. MSK: Strength and tone appear normal for age. 5/5 in all extremities Extremities: No  edema. No clubbing or cyanosis. DP/PT pulses 2+ bilaterally Neuro: Alert and oriented. No focal deficits. No facial asymmetry. MAE spontaneously. Psych: Responds to questions appropriately with normal affect.     EKG:  EKG is ordered today. The ekg ordered today demonstrates Sinus bradycardia with known RBBB and non specific T wave abnormalities.    Recent Labs: 01/19/2018: ALT 22; Hemoglobin 14.9; Platelets CANCELED 05/03/2018: BUN 22; Creatinine, Ser 1.13; Potassium 4.4; Sodium 135    Lipid Panel    Component Value Date/Time   CHOL 155 01/19/2018 1429   TRIG 179 (H) 01/19/2018 1429   HDL 53 01/19/2018 1429   CHOLHDL 2.9 01/19/2018 1429   VLDL UNABLE TO CALCULATE IF TRIGLYCERIDE OVER 400 mg/dL 02/26/2017 2018   LDLCALC 74 01/19/2018 1429     Wt Readings from Last 3 Encounters:  07/18/18 197 lb 12.8 oz (89.7 kg)  07/11/18 200 lb 9.6 oz (91 kg)  06/13/18 194 lb 3.2 oz (88.1 kg)     Other studies Reviewed: Additional studies/ records that were reviewed today include:  NUC STUDY  02/28/17 Study Result     There was no ST segment deviation noted during stress.  The study is normal.  This is a low risk study.  Nuclear stress EF: 53%.  Low risk stress nuclear study with normal perfusion and low normal left ventricular global systolic function.   ECHO 02/28/17 Study Conclusions  - Left ventricle: The cavity size was normal. Wall thickness was increased in a pattern of mild LVH. Systolic function was normal. The estimated ejection fraction was in the range of 60% to 65%. Wall motion was normal; there were no regional wall motion abnormalities. - Aortic valve: Trileaflet; mildly calcified leaflets. - Mitral valve: Calcified annulus. - Right ventricle: The cavity size was mildly dilated. Wall thickness was normal. Systolic function was normal.   ASSESSMENT AND PLAN:  1.  Hx of CAD s/p DES/PCI to mLAD in 2015: -Last cardiac workup included echocardiogram  and Lexiscan myoview stress test which were both low risk, normal studies -Denies anginal symptoms  -Continue ASA, carvedilol, Plavix, atorvastatin   2. Ongoing tobacco use: -Motivated to quit>>continues to smoke approximately  1PPD -Cessation strongly encouraged  -Tried Chantix in the past with significant side effects  4. DM-2, insulin dependent: -Last HbA1c, 8.9 05/03/18 -Managed per PCP, OP internal medicine service>>workup in progress for pump  -Discussed diet and exercise strategies   5. HIV: -Managed with Dr. Johnnye Sima  6. HTN: -Stable today, 128/70 -Continue carvedilol 3.125, lisinopril-HCTZ (Zestoretic) 20-25   7. HLD: -Stable, LDL 74 on 01/19/18 with goal of <70,  continue statin  -No complaints    Current medicines are reviewed at length with the patient today.  The patient does not have concerns regarding medicines.  The following changes have been made:  no change  Labs/ tests ordered today include: None   Orders Placed This Encounter  Procedures  . EKG 12-Lead    Disposition:   FU with Riley Jones in 6 months  Signed, Kathyrn Drown, NP  07/18/2018 8:39 AM    [address], [place], [place]  [address] Phone: [phone]; Fax: [phone]

## 2018-07-18 ENCOUNTER — Encounter: Payer: Self-pay | Admitting: Internal Medicine

## 2018-07-18 ENCOUNTER — Ambulatory Visit (INDEPENDENT_AMBULATORY_CARE_PROVIDER_SITE_OTHER): Payer: Medicare HMO | Admitting: Cardiology

## 2018-07-18 ENCOUNTER — Ambulatory Visit: Payer: Medicare HMO | Admitting: Pharmacist

## 2018-07-18 ENCOUNTER — Encounter: Payer: Self-pay | Admitting: Cardiology

## 2018-07-18 ENCOUNTER — Other Ambulatory Visit: Payer: Self-pay

## 2018-07-18 ENCOUNTER — Ambulatory Visit (INDEPENDENT_AMBULATORY_CARE_PROVIDER_SITE_OTHER): Payer: Medicare HMO | Admitting: Internal Medicine

## 2018-07-18 VITALS — BP 128/70 | HR 58 | Ht 70.0 in | Wt 197.8 lb

## 2018-07-18 VITALS — BP 132/74 | HR 59 | Temp 97.7°F | Ht 70.0 in | Wt 197.9 lb

## 2018-07-18 DIAGNOSIS — E1169 Type 2 diabetes mellitus with other specified complication: Secondary | ICD-10-CM

## 2018-07-18 DIAGNOSIS — Z720 Tobacco use: Secondary | ICD-10-CM | POA: Diagnosis not present

## 2018-07-18 DIAGNOSIS — Z794 Long term (current) use of insulin: Secondary | ICD-10-CM | POA: Diagnosis not present

## 2018-07-18 DIAGNOSIS — I2510 Atherosclerotic heart disease of native coronary artery without angina pectoris: Secondary | ICD-10-CM

## 2018-07-18 DIAGNOSIS — F17200 Nicotine dependence, unspecified, uncomplicated: Secondary | ICD-10-CM

## 2018-07-18 MED ORDER — CLOPIDOGREL BISULFATE 75 MG PO TABS: 75.0000 mg | ORAL_TABLET | Freq: Every day | ORAL | 2 refills | Status: DC

## 2018-07-18 MED ORDER — INSULIN NPH (HUMAN) (ISOPHANE) 100 UNIT/ML ~~LOC~~ SUSP: 15.0000 [IU] | Freq: Two times a day (BID) | SUBCUTANEOUS | 3 refills | Status: DC

## 2018-07-18 NOTE — Patient Instructions (Signed)
Medication Instructions:   Your physician recommends that you continue on your current medications as directed. Please refer to the Current Medication list given to you today.   If you need a refill on your cardiac medications before your next appointment, please call your pharmacy.  Labwork: NONE ORDERED  TODAY    Testing/Procedures: NONE ORDERED  TODAY    Follow-Up:Your physician wants you to follow-up in:  IN  6  MONTHS WITH DR NISHAN   You will receive a reminder letter in the mail two months in advance. If you don't receive a letter, please call our office to schedule the follow-up appointment.      Any Other Special Instructions Will Be Listed Below (If Applicable).                                                                                                                                                   

## 2018-07-18 NOTE — Assessment & Plan Note (Addendum)
Patient average BG improved from 193 to 163. His percentage of lows increased from 2 to 4%. Seem to occur mostly at night, but patient did not wake up with symptoms this week. Will decrease Novolin to 15 units BID. Plan to follow-up in 3 weeks to evaluate need for Ozempic adjustment and further evaluate CBGs on current regimen.  Continue Ozempic 0.25, Jardiance 10 mg, and Metformin 1000 mg BID

## 2018-07-18 NOTE — Progress Notes (Addendum)
S: Riley Jones is a 62 y.o. male reports to clinical pharmacist appointment for continuous glucose monitoring.   Allergies  Allergen Reactions  . Penicillins Hives, Nausea Only and Rash    Has patient had a PCN reaction causing immediate rash, facial/tongue/throat swelling, SOB or lightheadedness with hypotension: Yes Has patient had a PCN reaction causing severe rash involving mucus membranes or skin necrosis: No Has patient had a PCN reaction that required hospitalization: No Has patient had a PCN reaction occurring within the last 10 years: No If all of the above answers are "NO", then may proceed with Cephalosporin use.   Marland Kitchen Zoloft [Sertraline Hcl] Other (See Comments)    Patient reported Psychomotor slowing / worsened depression  . Victoza [Liraglutide]     Did not tolerate this medication.  Complained of dizziness even at the lowest dose  . Chantix [Varenicline] Hives   Medication Sig  albuterol (VENTOLIN HFA) 108 (90 BASE) MCG/ACT inhaler Inhale 1 puff into the lungs every 4 (four) hours as needed. for shortness of breath. Or may use every 6 hours as needed.  ALPRAZolam (XANAX) 0.5 MG tablet Take 1 tablet (0.5 mg total) by mouth at bedtime as needed for anxiety.  aspirin 81 MG EC tablet Take 81 mg by mouth daily.    atorvastatin (LIPITOR) 40 MG tablet Take 1 tablet (40 mg total) by mouth daily.  bictegravir-emtricitabine-tenofovir AF (BIKTARVY) 50-200-25 MG TABS tablet Take 1 tablet daily by mouth.  carvedilol (COREG) 3.125 MG tablet TAKE 1 TABLET BY MOUTH TWICE DAILY WITH A MEAL  clopidogrel (PLAVIX) 75 MG tablet Take 1 tablet (75 mg total) by mouth daily.  clotrimazole-betamethasone (LOTRISONE) cream Apply 1 application 2 (two) times daily topically.  fluticasone (FLONASE) 50 MCG/ACT nasal spray Place 1 spray into both nostrils daily.  folic acid (FOLVITE) 1 MG tablet TAKE 1 TABLET BY MOUTH DAILY  glucose blood (ACCU-CHEK SMARTVIEW) test strip CHECK BLOOD SUGAR 4 TIMES A DAY.  diag code E11.69. Insulin dependent  HYDROcodone-acetaminophen (NORCO) 7.5-325 MG tablet Take 1 tablet by mouth every 6 (six) hours as needed for moderate pain.  insulin NPH Human (NOVOLIN N RELION) 100 UNIT/ML injection Inject 0.3 mLs (30 Units total) into the skin 2 (two) times daily. And syringes 2/day  Insulin Pen Needle (B-D UF III MINI PEN NEEDLES) 31G X 5 MM MISC Inject victoza once daily  JARDIANCE 10 MG TABS tablet TAKE 1 TABLET BY MOUTH ONCE DAILY  lisinopril-hydrochlorothiazide (PRINZIDE,ZESTORETIC) 20-25 MG tablet TAKE 1 TABLET EVERY DAY  loratadine (CLARITIN) 10 MG tablet Take 1 tablet (10 mg total) by mouth daily.  metFORMIN (GLUCOPHAGE) 1000 MG tablet TAKE ONE TABLET BY MOUTH TWICE DAILY WITH A MEAL  nitroGLYCERIN (NITROSTAT) 0.4 MG SL tablet PLACE 1 TABLET UNDER THE TONGUE EVERY 5 MINUTES AS NEEDED FOR CHEST PAIN  ranitidine (ZANTAC) 150 MG tablet TAKE 1 TABLET BY MOUTH TWICE DAILY  Semaglutide,0.25 or 0.5MG /DOS, (OZEMPIC, 0.25 OR 0.5 MG/DOSE,) 2 MG/1.5ML SOPN Inject 0.5 mg into the skin once a week.  terazosin (HYTRIN) 2 MG capsule TAKE 1 CAPSULE BY MOUTH AT BEDTIME   Past Medical History:  Diagnosis Date  . Anxiety   . Arthritis    "pretty much all over"  . Asthma    "mild" (07/17/2014)  . Atypical angina ([place])    Myoview 2003, Normal EF 64%  . CAD (coronary artery disease)    a. 10/20 DES to 80% mid LAD  . COPD (chronic obstructive pulmonary disease) ([place])    "mild" (  07/17/2014)  . Depression   . Foot pain 03/30/2011  . GERD (gastroesophageal reflux disease)   . H/O hiatal hernia   . Headache    "weekly" (06/2014)  . History of gout   . HIV (human immunodeficiency virus infection) ([place]) 1995   DX after shingles followed by Dr. Ola Spurr ([place])  . Hx of cardiovascular stress test    ETT-Myoview (3/16):  Ex 10:15, No Ischemia, EF 57%; Normal Study  . Hyperlipidemia   . Hypertension    Well Controlled off meds  . Kidney stones    "passed them all"  . Pneumonia  4-5 times  . Rheumatoid arthritis(714.0)    Followed by Dr. Charlestine Night, off MTX and prdnisone since 2007  . Sleep apnea    does not wear mask (07/17/2014)  . Type II diabetes mellitus ([place])    Social History   Socioeconomic History  . Marital status: Single    Spouse name: Not on file  . Number of children: Not on file  . Years of education: 11  . Highest education level: Not on file  Occupational History  . Occupation: [employer]  . Occupation: Truck Geophysicist/field seismologist (retired)  Social Needs  . Financial resource strain: Not on file  . Food insecurity:    Worry: Not on file    Inability: Not on file  . Transportation needs:    Medical: Not on file    Non-medical: Not on file  Tobacco Use  . Smoking status: Current Every Day Smoker    Packs/day: 1.00    Years: 46.00    Pack years: 46.00    Types: Cigarettes  . Smokeless tobacco: Never Used  . Tobacco comment: Thinking about  Substance and Sexual Activity  . Alcohol use: Yes    Alcohol/week: 1.0 standard drinks    Types: 1 Glasses of wine per week    Comment: 07/17/2014 "glass of wine once or twice/month"  . Drug use: No  . Sexual activity: Not Currently    Partners: Male    Comment: given condoms  Lifestyle  . Physical activity:    Days per week: Not on file    Minutes per session: Not on file  . Stress: Not on file  Relationships  . Social connections:    Talks on phone: Not on file    Gets together: Not on file    Attends religious service: Not on file    Active member of club or organization: Not on file    Attends meetings of clubs or organizations: Not on file    Relationship status: Not on file  Other Topics Concern  . Not on file  Social History Narrative   [place] approved beginning 12/11/2009 - 12/27/2010   [employer] benefits approved; patient eligible for 100% discount for out patient labs and office visits. Patient eligible for 70% discount for other services per Irish Elders 09/08/2010      Patient is on  disability, and walks 2-3 times per week.    Family History  Problem Relation Age of Onset  . Osteoporosis Mother   . Diabetes Mother   . Heart disease Father   . Heart attack Father   . Diabetes Father   . Heart attack Brother   . Stroke Maternal Grandmother   . Stroke Maternal Grandfather   . Stroke Paternal Grandmother   . Stroke Paternal Grandfather    O:    Component Value Date/Time   CHOL 155 01/19/2018 1429   HDL 53 01/19/2018  1429   TRIG 179 (H) 01/19/2018 1429   AST 25 01/19/2018 1429   ALT 22 01/19/2018 1429   NA 135 05/03/2018 0912   K 4.4 05/03/2018 0912   CL 94 (L) 05/03/2018 0912   CO2 24 05/03/2018 0912   GLUCOSE 155 (H) 05/03/2018 0912   GLUCOSE 247 (H) 01/19/2018 1429   HGBA1C 8.9 (A) 05/03/2018 0839   HGBA1C 10.0 (H) 02/26/2017 2018   BUN 22 05/03/2018 0912   CREATININE 1.13 05/03/2018 0912   CREATININE 1.29 (H) 01/19/2018 1429   CALCIUM 10.1 05/03/2018 0912   GFRNONAA 69 05/03/2018 0912   GFRNONAA 85 10/29/2016 1603   GFRAA 80 05/03/2018 0912   GFRAA >89 10/29/2016 1603   WBC 7.3 01/19/2018 1429   HGB 14.9 01/19/2018 1429   HCT 43.0 01/19/2018 1429   PLT CANCELED 01/19/2018 1429   TSH 1.242 12/14/2011 1433   Ht Readings from Last 2 Encounters:  07/18/18 5\' 10"  (1.778 m)  07/18/18 5\' 10"  (1.778 m)   Wt Readings from Last 2 Encounters:  07/18/18 197 lb 14.4 oz (89.8 kg)  07/18/18 197 lb 12.8 oz (89.7 kg)   There is no height or weight on file to calculate BMI. BP Readings from Last 3 Encounters:  07/18/18 132/74  07/18/18 128/70  07/11/18 133/63    A/P: Patient was seen today in a co-visit with Dr. Koleen Distance.  [employer] and Reviewed  Week #2 of monitoring  14-day BG average 163, within target range 65% of the time  Above 180 mg/dL 31% of the time, highest around nighttime  Below 70 mg/dL 4% of the time, lowest around evening  Below 54 mg/dL 1% of the time  Coefficient of variation 41.8%  Standard  deviation: 68.3 mg/dL   Discussed with Dr. Koleen Distance plan to decrease Novolin to 15 units BID, patient and physician agree.  Follow up in 3 weeks regarding semaglutide titration and follow up BG management. Will notify Dr. Loanne Drilling of findings.  See documentation under Dr. Janne Napoleon visit for details.   An after visit summary was provided and patient advised to follow up in 3 weeks or sooner if any changes in condition or questions regarding medications arise.   The patient verbalized understanding of information provided by repeating back concepts discussed.   15 minutes spent face-to-face with the patient during the encounter. 50% of time spent on education. 50% of time was spent on assessment, plan, coordination of care.

## 2018-07-18 NOTE — Patient Instructions (Signed)
Mr. Beadnell, It was great to see you again today! Your blood sugars have continued to improve. Keep up the great work with your diet. We are going to decrease your Novolin to 15 units twice daily to try and prevent your blood sugar from going too low. Continue the Ozempic 0.25 once weekly, as well as daily Jardiance and Metformin as prescribed. We will see you back in 3 weeks to see how this regimen is doing (remember to bring your glucometer).   I commend you for trying to quit smoking. You seem like you are doing well with it on your own, but if you feel like you need medications to help we can always look into trying that as well.   Please call if you have any questions or concerns. Otherwise, we'll see you back in 3 weeks!

## 2018-07-18 NOTE — Progress Notes (Signed)
   CC: Diabetes   HPI:  [name] is a 62 y.o. gentleman with PMHx listed below who presents for follow-up on DM. This is his visit for second CGM download. His regimen was adjusted for symptomatic hypoglycemia at night by decreased Novolin to 25 units BID. He was also started on Ozempic 0.25 mg weekly. The monitor was worn for 7 days. The average reading was 163, % time in target was 65, % time below target was 4, and % time above target was 31. Intervention will be to decrease Novolin to 15 units BID and continue Ozempic 0.25 mg for another 3 weeks.  Patient had one day of loose stools after starting Ozempic, but has not had any side effects since then. 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 He is doing well with his diet and enjoys cooking for himself at home. He also plans to stop smoking this month and thinks he will be able to do it on his own.    Past Medical History:  Diagnosis Date  . Anxiety   . Arthritis    "pretty much all over"  . Asthma    "mild" (07/17/2014)  . Atypical angina ([place])    Myoview 2003, Normal EF 64%  . CAD (coronary artery disease)    a. 10/20 DES to 80% mid LAD  . COPD (chronic obstructive pulmonary disease) ([place])    "mild" (07/17/2014)  . Depression   . Foot pain 03/30/2011  . GERD (gastroesophageal reflux disease)   . H/O hiatal hernia   . Headache    "weekly" (06/2014)  . History of gout   . HIV (human immunodeficiency virus infection) ([place]) 1995   DX after shingles followed by Dr. Ola Spurr ([place])  . Hx of cardiovascular stress test    ETT-Myoview (3/16):  Ex 10:15, No Ischemia, EF 57%; Normal Study  . Hyperlipidemia   . Hypertension    Well Controlled off meds  . Kidney stones    "passed them all"  . Pneumonia 4-5 times  . Rheumatoid arthritis(714.0)    Followed by Dr. Charlestine Night, off MTX and prdnisone since 2007  . Sleep apnea    does not wear mask  (07/17/2014)  . Type II diabetes mellitus ([place])    Review of Systems:   Constitutional: negative for fevers, chills CV: negative for chest pain, palpitations Resp: negative for cough, shortness of breath GI: negative for abdominal pain, n/v, changes in bowel habits GU: negative urinary symptoms   Physical Exam:  Vitals:   07/18/18 0913  BP: 132/74  Pulse: (!) 59  Temp: 97.7 F (36.5 C)  TempSrc: Oral  SpO2: 99%  Weight: 197 lb 14.4 oz (89.8 kg)  Height: 5\' 10"  (1.778 m)   General: alert, pleasant gentleman, appears stated age, NAD CV: RRR; no murmurs, rubs or gallops Pulm: normal respiratory effort; lungs CTA bilaterally Ext: no edema Psych: appropriate mood and affect   Assessment & Plan:   See Encounters Tab for problem based charting.  Patient seen with Dr. Rebeca Alert

## 2018-07-18 NOTE — Assessment & Plan Note (Signed)
Patient is ready to quit smoking. He plans to taper himself off over the next month and feels he will be able to do this on his own. Was given information on [facility].

## 2018-07-18 NOTE — Progress Notes (Addendum)
Patient was seen today in a co-visit with Dr. Koleen Distance.  [employer] and Reviewed  Week #2 of monitoring  14-day BG average 163, within target range 65% of the time  Above 180 mg/dL 31% of the time, highest around nighttime  Below 70 mg/dL 4% of the time, lowest around evening  Below 54 mg/dL 1% of the time  Coefficient of variation 41.8%  Standard deviation: 68.3 mg/dL  Patient's BG average and time within range have improved. Last week's CGM results: average reading was 193, % time in target was 50, % time below target was 2, and % time above target was 48.  Hypoglycemic events have increased from 2% to 4%. Discussed with Dr. Koleen Distance plan to decrease Novolin to 15 units BID, patient and physician agree.  Follow up in 3 weeks regarding semaglutide titration and follow up BG management. Will notify Dr. Loanne Drilling of findings.

## 2018-07-19 NOTE — Progress Notes (Signed)
Internal Medicine Clinic Attending  Case seen with Dr. Koleen Distance. We reviewed the resident's history and exam and pertinent patient test results. I personally reviewed the CGM data & the resident's interpretation. I agree with the assessment, diagnosis, and plan of care documented in the resident's note.

## 2018-07-19 NOTE — Progress Notes (Signed)
Internal Medicine Clinic Attending  I saw and evaluated the patient.  I personally confirmed the key portions of the history and exam documented by Dr. Bloomfield and I reviewed pertinent patient test results.  The assessment, diagnosis, and plan were formulated together and I agree with the documentation in the resident's note.  

## 2018-07-19 NOTE — Addendum Note (Signed)
Addended by: Gilles Chiquito B on: 07/19/2018 11:07 AM   Modules accepted: Level of Service

## 2018-07-25 ENCOUNTER — Ambulatory Visit (INDEPENDENT_AMBULATORY_CARE_PROVIDER_SITE_OTHER): Payer: Medicare HMO | Admitting: Endocrinology

## 2018-07-25 ENCOUNTER — Encounter: Payer: Self-pay | Admitting: Endocrinology

## 2018-07-25 VITALS — BP 140/72 | HR 73 | Ht 70.0 in | Wt 195.2 lb

## 2018-07-25 DIAGNOSIS — Z794 Long term (current) use of insulin: Secondary | ICD-10-CM | POA: Diagnosis not present

## 2018-07-25 DIAGNOSIS — E1169 Type 2 diabetes mellitus with other specified complication: Secondary | ICD-10-CM

## 2018-07-25 LAB — POCT GLYCOSYLATED HEMOGLOBIN (HGB A1C): HEMOGLOBIN A1C: 8.1 % — AB (ref 4.0–5.6)

## 2018-07-25 NOTE — Patient Instructions (Addendum)
check your blood sugar twice a day.  vary the time of day when you check, between before the 3 meals, and at bedtime.  also check if you have symptoms of your blood sugar being too high or too low.  please keep a record of the readings and bring it to your next appointment here (or you can bring the meter itself).  You can write it on any piece of paper.  please call us sooner if your blood sugar goes below 70, or if you have a lot of readings over 200.   You could probably go off the insulin by adding more meds: that would be good, but the meds needed to do that would put you in the "donut hole."  However, we don't have samples available here.  I would be happy to see you back here as needed.

## 2018-07-25 NOTE — Progress Notes (Signed)
Subjective:    Patient ID: Riley Jones, male    DOB: 1955/12/12, 62 y.o.   MRN: [id number]  HPI Pt returns for f/u of diabetes mellitus: DM type: Insulin-requiring type 2.  Dx'ed: 4944 Complications: polyneuropathy and CAD Therapy: insulin since soon after dx DKA: never Severe hypoglycemia: never Pancreatitis: never Pancreatic imaging: normal on 2013 [place] Other: pt declines brand name meds, due to anticipated "donut hole."  Interval history: He was started on Ozempic and insulin was reduced.  Meter is downloaded today, and the printout is scanned into the record.  cbg varies from 80-190.  There is no trend throughout the day.  Past Medical History:  Diagnosis Date  . Anxiety   . Arthritis    "pretty much all over"  . Asthma    "mild" (07/17/2014)  . Atypical angina ([place])    Myoview 2003, Normal EF 64%  . CAD (coronary artery disease)    a. 10/20 DES to 80% mid LAD  . COPD (chronic obstructive pulmonary disease) ([place])    "mild" (07/17/2014)  . Depression   . Foot pain 03/30/2011  . GERD (gastroesophageal reflux disease)   . H/O hiatal hernia   . Headache    "weekly" (06/2014)  . History of gout   . HIV (human immunodeficiency virus infection) ([place]) 1995   DX after shingles followed by Dr. Ola Spurr (ID)  . Hx of cardiovascular stress test    ETT-Myoview (3/16):  Ex 10:15, No Ischemia, EF 57%; Normal Study  . Hyperlipidemia   . Hypertension    Well Controlled off meds  . Kidney stones    "passed them all"  . Pneumonia 4-5 times  . Rheumatoid arthritis(714.0)    Followed by Dr. Charlestine Night, off MTX and prdnisone since 2007  . Sleep apnea    does not wear mask (07/17/2014)  . Type II diabetes mellitus ([place])     Past Surgical History:  Procedure Laterality Date  . APPENDECTOMY  ~ 1981  . CARPAL TUNNEL RELEASE Left 1990's  . CORONARY ANGIOPLASTY WITH STENT PLACEMENT  07/17/2014   a. DES to 80% mid LAD  . HERNIA REPAIR    . LAPAROSCOPIC INCISIONAL / UMBILICAL /  VENTRAL HERNIA REPAIR  1990's   "it was a double; not inguinal"  . LEFT HEART CATHETERIZATION WITH CORONARY ANGIOGRAM N/A 07/17/2014   Procedure: LEFT HEART CATHETERIZATION WITH CORONARY ANGIOGRAM;  Surgeon: [name], MD;  Location: [facility] CATH LAB;  Service: Cardiovascular;  Laterality: N/A;  . PERCUTANEOUS STENT INTERVENTION  07/17/2014   Procedure: PERCUTANEOUS STENT INTERVENTION;  Surgeon: [name], MD;  Location: [facility] CATH LAB;  Service: Cardiovascular;;  . TONSILLECTOMY AND ADENOIDECTOMY  1990's  . TRIGGER FINGER RELEASE Left 1990's   2nd & 5th digits  . UMBILICAL HERNIA REPAIR  1980's   w/mesh  . UVULOPALATOPHARYNGOPLASTY (UPPP)/TONSILLECTOMY/SEPTOPLASTY  1990's    Social History   Socioeconomic History  . Marital status: Single    Spouse name: Not on file  . Number of children: Not on file  . Years of education: 3  . Highest education level: Not on file  Occupational History  . Occupation: [employer]  . Occupation: Truck Geophysicist/field seismologist (retired)  Social Needs  . Financial resource strain: Not on file  . Food insecurity:    Worry: Not on file    Inability: Not on file  . Transportation needs:    Medical: Not on file    Non-medical: Not on file  Tobacco Use  . Smoking  status: Current Every Day Smoker    Packs/day: 1.00    Years: 46.00    Pack years: 46.00    Types: Cigarettes  . Smokeless tobacco: Never Used  . Tobacco comment: Thinking about  Substance and Sexual Activity  . Alcohol use: Yes    Alcohol/week: 1.0 standard drinks    Types: 1 Glasses of wine per week    Comment: 07/17/2014 "glass of wine once or twice/month"  . Drug use: No  . Sexual activity: Not Currently    Partners: Male    Comment: given condoms  Lifestyle  . Physical activity:    Days per week: Not on file    Minutes per session: Not on file  . Stress: Not on file  Relationships  . Social connections:    Talks on phone: Not on file    Gets together: Not on file    Attends religious  service: Not on file    Active member of club or organization: Not on file    Attends meetings of clubs or organizations: Not on file    Relationship status: Not on file  . Intimate partner violence:    Fear of current or ex partner: Not on file    Emotionally abused: Not on file    Physically abused: Not on file    Forced sexual activity: Not on file  Other Topics Concern  . Not on file  Social History Narrative   NCAD[employer] approved beginning 12/11/2009 - 12/27/2010   [employer] benefits approved; patient eligible for 100% discount for out patient labs and office visits. Patient eligible for 70% discount for other services per Irish Elders 09/08/2010      Patient is on disability, and walks 2-3 times per week.     Current Outpatient Medications on File Prior to Visit  Medication Sig Dispense Refill  . albuterol (VENTOLIN HFA) 108 (90 BASE) MCG/ACT inhaler Inhale 1 puff into the lungs every 4 (four) hours as needed. for shortness of breath. Or may use every 6 hours as needed.    Marland Kitchen aspirin 81 MG EC tablet Take 81 mg by mouth daily.      Marland Kitchen atorvastatin (LIPITOR) 40 MG tablet Take 1 tablet (40 mg total) by mouth daily. 90 tablet 1  . bictegravir-emtricitabine-tenofovir AF (BIKTARVY) 50-200-25 MG TABS tablet Take 1 tablet daily by mouth. 90 tablet 3  . clopidogrel (PLAVIX) 75 MG tablet Take 1 tablet (75 mg total) by mouth daily. 90 tablet 2  . fluticasone (FLONASE) 50 MCG/ACT nasal spray Place 1 spray into both nostrils daily. 16 g 0  . folic acid (FOLVITE) 1 MG tablet TAKE 1 TABLET BY MOUTH DAILY 90 tablet 3  . glucose blood (ACCU-CHEK SMARTVIEW) test strip CHECK BLOOD SUGAR 4 TIMES A DAY. diag code E11.69. Insulin dependent 360 each 1  . insulin NPH Human (NOVOLIN N RELION) 100 UNIT/ML injection Inject 0.15 mLs (15 Units total) into the skin 2 (two) times daily. And syringes 2/day 20 mL 3  . Insulin Pen Needle (B-D UF III MINI PEN NEEDLES) 31G X 5 MM MISC Inject victoza once daily 100 each 3   . JARDIANCE 10 MG TABS tablet TAKE 1 TABLET BY MOUTH ONCE DAILY 90 tablet 3  . lisinopril-hydrochlorothiazide (PRINZIDE,ZESTORETIC) 20-25 MG tablet TAKE 1 TABLET EVERY DAY 90 tablet 1  . loratadine (CLARITIN) 10 MG tablet Take 1 tablet (10 mg total) by mouth daily. 90 tablet 3  . metFORMIN (GLUCOPHAGE) 1000 MG tablet TAKE ONE TABLET  BY MOUTH TWICE DAILY WITH A MEAL 180 tablet 1  . nitroGLYCERIN (NITROSTAT) 0.4 MG SL tablet PLACE 1 TABLET UNDER THE TONGUE EVERY 5 MINUTES AS NEEDED FOR CHEST PAIN 25 tablet 0  . ranitidine (ZANTAC) 150 MG tablet TAKE 1 TABLET BY MOUTH TWICE DAILY 180 tablet 3  . Semaglutide,0.25 or 0.5MG /DOS, (OZEMPIC, 0.25 OR 0.5 MG/DOSE,) 2 MG/1.5ML SOPN Inject 0.5 mg into the skin once a week.     No current facility-administered medications on file prior to visit.     Allergies  Allergen Reactions  . Penicillins Hives, Nausea Only and Rash    Has patient had a PCN reaction causing immediate rash, facial/tongue/throat swelling, SOB or lightheadedness with hypotension: Yes Has patient had a PCN reaction causing severe rash involving mucus membranes or skin necrosis: No Has patient had a PCN reaction that required hospitalization: No Has patient had a PCN reaction occurring within the last 10 years: No If all of the above answers are "NO", then may proceed with Cephalosporin use.   Marland Kitchen Zoloft [Sertraline Hcl] Other (See Comments)    Patient reported Psychomotor slowing / worsened depression  . Victoza [Liraglutide]     Did not tolerate this medication.  Complained of dizziness even at the lowest dose  . Chantix [Varenicline] Hives    Family History  Problem Relation Age of Onset  . Osteoporosis Mother   . Diabetes Mother   . Heart disease Father   . Heart attack Father   . Diabetes Father   . Heart attack Brother   . Stroke Maternal Grandmother   . Stroke Maternal Grandfather   . Stroke Paternal Grandmother   . Stroke Paternal Grandfather     BP 140/72 (BP  Location: Left Arm, Patient Position: Sitting, Cuff Size: Normal)   Pulse 73   Ht 5\' 10"  (1.778 m)   Wt 195 lb 3.2 oz (88.5 kg)   SpO2 95%   BMI 28.01 kg/m   Review of Systems He denies hypoglycemia    Objective:   Physical Exam VITAL SIGNS:  See vs page GENERAL: no distress Pulses: dorsalis pedis intact bilat.   MSK: no deformity of the feet CV: no leg edema Skin:  no ulcer on the feet.  normal color and temp on the feet. Neuro: sensation is intact to touch on the feet   Lab Results  Component Value Date   HGBA1C 8.1 (A) 07/25/2018       Assessment & Plan:  Insulin-requiring type 2 DM, with CAD.  we discussed the risks of having different providers adjusting meds.    Patient Instructions  check your blood sugar twice a day.  vary the time of day when you check, between before the 3 meals, and at bedtime.  also check if you have symptoms of your blood sugar being too high or too low.  please keep a record of the readings and bring it to your next appointment here (or you can bring the meter itself).  You can write it on any piece of paper.  please call us sooner if your blood sugar goes below 70, or if you have a lot of readings over 200.   You could probably go off the insulin by adding more meds: that would be good, but the meds needed to do that would put you in the "donut hole."  However, we don't have samples available here.  I would be happy to see you back here as needed.

## 2018-07-26 ENCOUNTER — Other Ambulatory Visit: Payer: Self-pay | Admitting: Internal Medicine

## 2018-07-26 ENCOUNTER — Other Ambulatory Visit: Payer: Self-pay | Admitting: Infectious Diseases

## 2018-07-26 DIAGNOSIS — I10 Essential (primary) hypertension: Secondary | ICD-10-CM

## 2018-07-27 ENCOUNTER — Other Ambulatory Visit: Payer: Self-pay | Admitting: Internal Medicine

## 2018-07-27 DIAGNOSIS — F411 Generalized anxiety disorder: Secondary | ICD-10-CM

## 2018-07-27 DIAGNOSIS — Z79891 Long term (current) use of opiate analgesic: Secondary | ICD-10-CM

## 2018-07-27 MED ORDER — HYDROCODONE-ACETAMINOPHEN 7.5-325 MG PO TABS: 1.0000 | ORAL_TABLET | Freq: Four times a day (QID) | ORAL | 0 refills | Status: DC | PRN

## 2018-07-27 MED ORDER — ALPRAZOLAM 0.5 MG PO TABS: 0.5000 mg | ORAL_TABLET | Freq: Every evening | ORAL | 0 refills | Status: DC | PRN

## 2018-08-08 ENCOUNTER — Encounter: Payer: Medicare HMO | Attending: Endocrinology | Admitting: Nutrition

## 2018-08-08 ENCOUNTER — Ambulatory Visit: Payer: Medicare HMO

## 2018-08-08 DIAGNOSIS — E1169 Type 2 diabetes mellitus with other specified complication: Secondary | ICD-10-CM

## 2018-08-09 ENCOUNTER — Telehealth: Payer: Self-pay | Admitting: Dietician

## 2018-08-09 NOTE — Telephone Encounter (Signed)
Mr. Crossland presented to the office with insulin pump and CGM insurance investigation applications that he got from Dr. Cordelia Pen office. He would like [place] to compete the physician part and fax them for him. He says he was having side effects from the Ozempic- dry heaves, constipation and that he vomited last week and had to call out of work, but this week they are better. He took it again yesterday and thinks it is helping curb his appetite and lower his blood sugars. He has a follow up appointment next week.

## 2018-08-15 ENCOUNTER — Ambulatory Visit: Payer: Medicare HMO | Admitting: Pharmacist

## 2018-08-15 DIAGNOSIS — Z794 Long term (current) use of insulin: Principal | ICD-10-CM

## 2018-08-15 DIAGNOSIS — E1169 Type 2 diabetes mellitus with other specified complication: Secondary | ICD-10-CM

## 2018-08-15 MED ORDER — EMPAGLIFLOZIN-METFORMIN HCL ER 5-1000 MG PO TB24: 1.0000 | ORAL_TABLET | Freq: Two times a day (BID) | ORAL | 11 refills | Status: DC

## 2018-08-15 MED ORDER — SEMAGLUTIDE(0.25 OR 0.5MG/DOS) 2 MG/1.5ML ~~LOC~~ SOPN: 0.5000 mg | PEN_INJECTOR | SUBCUTANEOUS | 3 refills | Status: DC

## 2018-08-15 MED ORDER — INSULIN DEGLUDEC 100 UNIT/ML ~~LOC~~ SOPN: 25.0000 [IU] | PEN_INJECTOR | Freq: Every day | SUBCUTANEOUS | 11 refills | Status: DC

## 2018-08-15 NOTE — Progress Notes (Signed)
S: Riley Jones is a 62 y.o. male reports to clinical pharmacist appointment for help with diabetes medication management.  Allergies  Allergen Reactions  . Penicillins Hives, Nausea Only and Rash    Has patient had a PCN reaction causing immediate rash, facial/tongue/throat swelling, SOB or lightheadedness with hypotension: Yes Has patient had a PCN reaction causing severe rash involving mucus membranes or skin necrosis: No Has patient had a PCN reaction that required hospitalization: No Has patient had a PCN reaction occurring within the last 10 years: No If all of the above answers are "NO", then may proceed with Cephalosporin use.   Marland Kitchen Zoloft [Sertraline Hcl] Other (See Comments)    Patient reported Psychomotor slowing / worsened depression  . Victoza [Liraglutide]     Did not tolerate this medication.  Complained of dizziness even at the lowest dose  . Chantix [Varenicline] Hives   Medication Sig  albuterol (VENTOLIN HFA) 108 (90 BASE) MCG/ACT inhaler Inhale 1 puff into the lungs every 4 (four) hours as needed. for shortness of breath. Or may use every 6 hours as needed.  ALPRAZolam (XANAX) 0.5 MG tablet Take 1 tablet (0.5 mg total) by mouth at bedtime as needed for anxiety.  aspirin 81 MG EC tablet Take 81 mg by mouth daily.    atorvastatin (LIPITOR) 40 MG tablet Take 1 tablet (40 mg total) by mouth daily.  bictegravir-emtricitabine-tenofovir AF (BIKTARVY) 50-200-25 MG TABS tablet Take 1 tablet daily by mouth.  carvedilol (COREG) 3.125 MG tablet TAKE 1 TABLET BY MOUTH TWICE DAILY WITH A MEAL  clopidogrel (PLAVIX) 75 MG tablet Take 1 tablet (75 mg total) by mouth daily.  clotrimazole-betamethasone (LOTRISONE) cream APPLY  CREAM TOPICALLY TWICE DAILY  Empagliflozin-metFORMIN HCl ER (SYNJARDY XR) 01-999 MG TB24 Take 1 tablet by mouth 2 (two) times daily with a meal.  fluticasone (FLONASE) 50 MCG/ACT nasal spray Place 1 spray into both nostrils daily.  folic acid (FOLVITE) 1 MG tablet  TAKE 1 TABLET BY MOUTH DAILY  glucose blood (ACCU-CHEK SMARTVIEW) test strip CHECK BLOOD SUGAR 4 TIMES A DAY. diag code E11.69. Insulin dependent  HYDROcodone-acetaminophen (NORCO) 7.5-325 MG tablet Take 1 tablet by mouth every 6 (six) hours as needed for moderate pain.  insulin degludec (TRESIBA FLEXTOUCH) 100 UNIT/ML SOPN FlexTouch Pen Inject 0.25 mLs (25 Units total) into the skin daily.  Insulin Pen Needle (B-D UF III MINI PEN NEEDLES) 31G X 5 MM MISC Inject victoza once daily  lisinopril-hydrochlorothiazide (PRINZIDE,ZESTORETIC) 20-25 MG tablet TAKE 1 TABLET EVERY DAY  loratadine (CLARITIN) 10 MG tablet Take 1 tablet (10 mg total) by mouth daily.  nitroGLYCERIN (NITROSTAT) 0.4 MG SL tablet PLACE 1 TABLET UNDER THE TONGUE EVERY 5 MINUTES AS NEEDED FOR CHEST PAIN  ranitidine (ZANTAC) 150 MG tablet TAKE 1 TABLET BY MOUTH TWICE DAILY  Semaglutide,0.25 or 0.5MG /DOS, (OZEMPIC, 0.25 OR 0.5 MG/DOSE,) 2 MG/1.5ML SOPN Inject 0.5 mg into the skin once a week.  terazosin (HYTRIN) 2 MG capsule TAKE 1 CAPSULE BY MOUTH AT BEDTIME   Past Medical History:  Diagnosis Date  . Anxiety   . Arthritis    "pretty much all over"  . Asthma    "mild" (07/17/2014)  . Atypical angina ([place])    Myoview 2003, Normal EF 64%  . CAD (coronary artery disease)    a. 10/20 DES to 80% mid LAD  . COPD (chronic obstructive pulmonary disease) ([place])    "mild" (07/17/2014)  . Depression   . Foot pain 03/30/2011  . GERD (gastroesophageal reflux  disease)   . H/O hiatal hernia   . Headache    "weekly" (06/2014)  . History of gout   . HIV (human immunodeficiency virus infection) ([place]) 1995   DX after shingles followed by Dr. Ola Spurr ([place])  . Hx of cardiovascular stress test    ETT-Myoview (3/16):  Ex 10:15, No Ischemia, EF 57%; Normal Study  . Hyperlipidemia   . Hypertension    Well Controlled off meds  . Kidney stones    "passed them all"  . Pneumonia 4-5 times  . Rheumatoid arthritis(714.0)    Followed by Dr.  Charlestine Night, off MTX and prdnisone since 2007  . Sleep apnea    does not wear mask (07/17/2014)  . Type II diabetes mellitus ([place])    Social History   Socioeconomic History  . Marital status: Single    Spouse name: Not on file  . Number of children: Not on file  . Years of education: 38  . Highest education level: Not on file  Occupational History  . Occupation: [employer]  . Occupation: Truck Geophysicist/field seismologist (retired)  Social Needs  . Financial resource strain: Not on file  . Food insecurity:    Worry: Not on file    Inability: Not on file  . Transportation needs:    Medical: Not on file    Non-medical: Not on file  Tobacco Use  . Smoking status: Current Every Day Smoker    Packs/day: 1.00    Years: 46.00    Pack years: 46.00    Types: Cigarettes  . Smokeless tobacco: Never Used  . Tobacco comment: Thinking about  Substance and Sexual Activity  . Alcohol use: Yes    Alcohol/week: 1.0 standard drinks    Types: 1 Glasses of wine per week    Comment: 07/17/2014 "glass of wine once or twice/month"  . Drug use: No  . Sexual activity: Not Currently    Partners: Male    Comment: given condoms  Lifestyle  . Physical activity:    Days per week: Not on file    Minutes per session: Not on file  . Stress: Not on file  Relationships  . Social connections:    Talks on phone: Not on file    Gets together: Not on file    Attends religious service: Not on file    Active member of club or organization: Not on file    Attends meetings of clubs or organizations: Not on file    Relationship status: Not on file  Other Topics Concern  . Not on file  Social History Narrative   [employer] approved beginning 12/11/2009 - 12/27/2010   [employer] benefits approved; patient eligible for 100% discount for out patient labs and office visits. Patient eligible for 70% discount for other services per Irish Elders 09/08/2010      Patient is on disability, and walks 2-3 times per week.    Family History   Problem Relation Age of Onset  . Osteoporosis Mother   . Diabetes Mother   . Heart disease Father   . Heart attack Father   . Diabetes Father   . Heart attack Brother   . Stroke Maternal Grandmother   . Stroke Maternal Grandfather   . Stroke Paternal Grandmother   . Stroke Paternal Grandfather    O: Component Value Date/Time   CHOL 155 01/19/2018 1429   HDL 53 01/19/2018 1429   TRIG 179 (H) 01/19/2018 1429   AST 25 01/19/2018 1429   ALT 22  01/19/2018 1429   NA 135 05/03/2018 0912   K 4.4 05/03/2018 0912   CL 94 (L) 05/03/2018 0912   CO2 24 05/03/2018 0912   GLUCOSE 155 (H) 05/03/2018 0912   GLUCOSE 247 (H) 01/19/2018 1429   HGBA1C 8.1 (A) 07/25/2018 0918   HGBA1C 10.0 (H) 02/26/2017 2018   BUN 22 05/03/2018 0912   CREATININE 1.13 05/03/2018 0912   CREATININE 1.29 (H) 01/19/2018 1429   CALCIUM 10.1 05/03/2018 0912   GFRNONAA 69 05/03/2018 0912   GFRNONAA 85 10/29/2016 1603   GFRAA 80 05/03/2018 0912   GFRAA >89 10/29/2016 1603   WBC 7.3 01/19/2018 1429   HGB 14.9 01/19/2018 1429   HCT 43.0 01/19/2018 1429   PLT CANCELED 01/19/2018 1429   TSH 1.242 12/14/2011 1433   Ht Readings from Last 2 Encounters:  07/25/18 5\' 10"  (1.778 m)  07/18/18 5\' 10"  (1.778 m)   Wt Readings from Last 2 Encounters:  07/25/18 195 lb 3.2 oz (88.5 kg)  07/18/18 197 lb 14.4 oz (89.8 kg)   There is no height or weight on file to calculate BMI. BP Readings from Last 3 Encounters:  07/25/18 140/72  07/18/18 132/74  07/18/18 128/70   A/P: Patient is currently taking metformin 1000 mg BID, empagliflozin 10 mg daily, semaglutide 0.25 mg weekly, and insulin NPH 15 units twice daily. He reports no signs or symptoms of concern today, but continues to have some GI upset from the semaglutide, which is lessening as he continues on therapy. We discussed possibly switching to dulaglutide, but patient prefers to continue semaglutide at this time.  Patient did bring home BG meter today, which shows an  average BG of 154 (correlates with A1C of 7%), no episodes of hypoglycemia, 81% within target range. Home meter does display more BG > 200 this month compared to October and this was reviewed with patient. He prefers to work on dietary modifications for now.  No therapy changes were made today other than regimen simplification: Combined empagliflozin with metformin Switched NPH insulin to degludec [name]) Continue semaglutide 0.25 mg weekly  An after visit summary was provided and patient advised to follow up if any changes in condition or questions regarding medications arise. Patient requested phone follow up within 2 weeks.   The patient verbalized understanding of information provided by repeating back concepts discussed.

## 2018-08-16 ENCOUNTER — Other Ambulatory Visit: Payer: Self-pay | Admitting: Infectious Diseases

## 2018-08-18 NOTE — Progress Notes (Signed)
We discussed how a pump works, and he was shown the three different pumps.  We discussed the advantages and disadvantages of each pump.  He was given brochures of each model, and encouraged to go on line to review each one.  He is very concerned about price/cost and will contact the representatives with these questions.

## 2018-08-18 NOTE — Patient Instructions (Signed)
Read over information given, and go on line to get further information.   Call representatives to get cost information

## 2018-08-24 NOTE — Progress Notes (Signed)
I reviewed note by Dr. Maudie Mercury.  Agree with regimen simplification.

## 2018-08-27 ENCOUNTER — Other Ambulatory Visit: Payer: Self-pay | Admitting: Internal Medicine

## 2018-08-27 DIAGNOSIS — F411 Generalized anxiety disorder: Secondary | ICD-10-CM

## 2018-08-27 DIAGNOSIS — Z79891 Long term (current) use of opiate analgesic: Secondary | ICD-10-CM

## 2018-08-29 MED ORDER — HYDROCODONE-ACETAMINOPHEN 7.5-325 MG PO TABS: 1.0000 | ORAL_TABLET | Freq: Four times a day (QID) | ORAL | 0 refills | Status: DC | PRN

## 2018-08-29 MED ORDER — ALPRAZOLAM 0.5 MG PO TABS: 0.5000 mg | ORAL_TABLET | Freq: Every evening | ORAL | 0 refills | Status: DC | PRN

## 2018-08-29 NOTE — Assessment & Plan Note (Signed)
-  [place] narcotic database reviewed and appropriate. Will refill norco today

## 2018-09-20 ENCOUNTER — Other Ambulatory Visit: Payer: Self-pay | Admitting: Internal Medicine

## 2018-09-23 NOTE — Telephone Encounter (Signed)
Not on current med list.

## 2018-09-23 NOTE — Telephone Encounter (Signed)
Reviewed chart, removed by Endocrine office Dr Loanne Drilling "patient preference" noted as reason, appears patient has continued to take, will provide refill so patient will not run out, defer future fills to PCP.

## 2018-09-25 ENCOUNTER — Other Ambulatory Visit: Payer: Self-pay | Admitting: Internal Medicine

## 2018-09-25 DIAGNOSIS — Z79891 Long term (current) use of opiate analgesic: Secondary | ICD-10-CM

## 2018-09-25 DIAGNOSIS — F411 Generalized anxiety disorder: Secondary | ICD-10-CM

## 2018-09-26 NOTE — Telephone Encounter (Signed)
Last rx written 08/29/18. Last OV 8/6 with PCP; saw Dr Koleen Distance 10/21. Next OV - no f/u visit scheduled. UDS 05/03/18.

## 2018-09-29 MED ORDER — HYDROCODONE-ACETAMINOPHEN 7.5-325 MG PO TABS: 1.0000 | ORAL_TABLET | Freq: Four times a day (QID) | ORAL | 0 refills | Status: DC | PRN

## 2018-09-29 MED ORDER — ALPRAZOLAM 0.5 MG PO TABS: 0.5000 mg | ORAL_TABLET | Freq: Every evening | ORAL | 0 refills | Status: DC | PRN

## 2018-10-04 ENCOUNTER — Encounter: Payer: Self-pay | Admitting: Internal Medicine

## 2018-10-05 ENCOUNTER — Encounter: Payer: Self-pay | Admitting: Internal Medicine

## 2018-10-10 ENCOUNTER — Telehealth: Payer: Self-pay | Admitting: *Deleted

## 2018-10-10 DIAGNOSIS — E1169 Type 2 diabetes mellitus with other specified complication: Secondary | ICD-10-CM

## 2018-10-10 NOTE — Telephone Encounter (Signed)
Thank you :)

## 2018-10-10 NOTE — Telephone Encounter (Signed)
Patient demographic form faxed to [employer] [facility]. at [phone] for diabetic shoes. They will fax [place] a form for Medical Necessity. Last foot exam 07/25/2018. Appt on 10/25/2018 with PCP for repeat foot exam and face to face note. Hubbard Hartshorn, RN, BSN

## 2018-10-19 NOTE — Telephone Encounter (Signed)
Call placed to [employer] as we have not received form for PCP to complete. Riley Jones stated that patient has no out of network benefits with [facility] and they notified him to call his insurance company.

## 2018-10-24 ENCOUNTER — Other Ambulatory Visit: Payer: Medicare HMO

## 2018-10-25 ENCOUNTER — Encounter: Payer: Self-pay | Admitting: Internal Medicine

## 2018-10-25 ENCOUNTER — Other Ambulatory Visit (HOSPITAL_COMMUNITY)
Admission: RE | Admit: 2018-10-25 | Discharge: 2018-10-25 | Disposition: A | Discharge status: Home / Self Care | Payer: Medicare HMO | Source: Ambulatory Visit | Attending: Infectious Diseases | Admitting: Infectious Diseases

## 2018-10-25 ENCOUNTER — Ambulatory Visit (INDEPENDENT_AMBULATORY_CARE_PROVIDER_SITE_OTHER): Payer: Medicare HMO | Admitting: Internal Medicine

## 2018-10-25 ENCOUNTER — Other Ambulatory Visit: Payer: Medicare HMO

## 2018-10-25 VITALS — BP 126/70 | HR 55 | Temp 97.7°F | Ht 70.0 in | Wt 198.5 lb

## 2018-10-25 DIAGNOSIS — E1169 Type 2 diabetes mellitus with other specified complication: Secondary | ICD-10-CM

## 2018-10-25 DIAGNOSIS — J449 Chronic obstructive pulmonary disease, unspecified: Secondary | ICD-10-CM | POA: Diagnosis not present

## 2018-10-25 DIAGNOSIS — F411 Generalized anxiety disorder: Secondary | ICD-10-CM | POA: Diagnosis not present

## 2018-10-25 DIAGNOSIS — Z79899 Other long term (current) drug therapy: Secondary | ICD-10-CM

## 2018-10-25 DIAGNOSIS — B20 Human immunodeficiency virus [HIV] disease: Secondary | ICD-10-CM

## 2018-10-25 DIAGNOSIS — K219 Gastro-esophageal reflux disease without esophagitis: Secondary | ICD-10-CM

## 2018-10-25 DIAGNOSIS — I10 Essential (primary) hypertension: Secondary | ICD-10-CM

## 2018-10-25 DIAGNOSIS — Z113 Encounter for screening for infections with a predominantly sexual mode of transmission: Secondary | ICD-10-CM | POA: Insufficient documentation

## 2018-10-25 DIAGNOSIS — I2510 Atherosclerotic heart disease of native coronary artery without angina pectoris: Secondary | ICD-10-CM

## 2018-10-25 DIAGNOSIS — E1142 Type 2 diabetes mellitus with diabetic polyneuropathy: Secondary | ICD-10-CM | POA: Diagnosis not present

## 2018-10-25 DIAGNOSIS — Z7902 Long term (current) use of antithrombotics/antiplatelets: Secondary | ICD-10-CM

## 2018-10-25 DIAGNOSIS — M069 Rheumatoid arthritis, unspecified: Secondary | ICD-10-CM | POA: Diagnosis not present

## 2018-10-25 DIAGNOSIS — F17200 Nicotine dependence, unspecified, uncomplicated: Secondary | ICD-10-CM

## 2018-10-25 DIAGNOSIS — J42 Unspecified chronic bronchitis: Secondary | ICD-10-CM

## 2018-10-25 DIAGNOSIS — Z794 Long term (current) use of insulin: Secondary | ICD-10-CM

## 2018-10-25 DIAGNOSIS — Z79891 Long term (current) use of opiate analgesic: Secondary | ICD-10-CM

## 2018-10-25 DIAGNOSIS — Z7982 Long term (current) use of aspirin: Secondary | ICD-10-CM

## 2018-10-25 DIAGNOSIS — F329 Major depressive disorder, single episode, unspecified: Secondary | ICD-10-CM

## 2018-10-25 DIAGNOSIS — F339 Major depressive disorder, recurrent, unspecified: Secondary | ICD-10-CM

## 2018-10-25 DIAGNOSIS — F32A Depression, unspecified: Secondary | ICD-10-CM

## 2018-10-25 DIAGNOSIS — Z720 Tobacco use: Secondary | ICD-10-CM

## 2018-10-25 LAB — POCT GLYCOSYLATED HEMOGLOBIN (HGB A1C): Hemoglobin A1C: 8.5 % — AB (ref 4.0–5.6)

## 2018-10-25 LAB — GLUCOSE, CAPILLARY: Glucose-Capillary: 133 mg/dL — ABNORMAL HIGH (ref 70–99)

## 2018-10-25 MED ORDER — LOSARTAN POTASSIUM-HCTZ 50-12.5 MG PO TABS: 1.0000 | ORAL_TABLET | Freq: Every day | ORAL | 1 refills | Status: DC

## 2018-10-25 MED ORDER — INSULIN GLARGINE 100 UNIT/ML SOLOSTAR PEN: 25.0000 [IU] | PEN_INJECTOR | Freq: Every day | SUBCUTANEOUS | 3 refills | Status: DC

## 2018-10-25 MED ORDER — OMEPRAZOLE 20 MG PO CPDR: 20.0000 mg | DELAYED_RELEASE_CAPSULE | ORAL | 1 refills | Status: DC | PRN

## 2018-10-25 MED ORDER — SEMAGLUTIDE(0.25 OR 0.5MG/DOS) 2 MG/1.5ML ~~LOC~~ SOPN: 1.0000 mg | PEN_INJECTOR | SUBCUTANEOUS | 3 refills | Status: DC

## 2018-10-25 MED ORDER — ALBUTEROL SULFATE HFA 108 (90 BASE) MCG/ACT IN AERS: 1.0000 | INHALATION_SPRAY | RESPIRATORY_TRACT | 3 refills | Status: DC | PRN

## 2018-10-25 NOTE — Assessment & Plan Note (Signed)
-  This problem is chronic and stable -Patient's ranitidine has been recalled -Patient has been taking over-the-counter omeprazole as needed for reflux symptoms -We will DC his ranitidine and start him on Prilosec 20 mg as needed -No further work-up at this time

## 2018-10-25 NOTE — Assessment & Plan Note (Addendum)
Lab Results  Component Value Date   HGBA1C 8.5 (A) 10/25/2018   HGBA1C 8.1 (A) 07/25/2018   HGBA1C 8.9 (A) 05/03/2018     Assessment: Diabetes control:  Fair Progress toward A1C goal:   Deteriorated Comments: Patient is compliant with Tresiba 25 units as well as Ozempic 0.5 mg q. weekly.  Patient complains of a rash at the [place] injection sites.  He was noted to have a maculopapular erythematous rash at the [place] injection site on exam  Plan: Medications:  Will DC Tresiba 25 units and start him on Lantus 25 units at night and increase his Ozempic to 1 mg weekly Home glucose monitoring: Frequency:   Timing:   Instruction/counseling given: reminded to get eye exam and discussed foot care Educational resources provided:   Self management tools provided:   Other plans: We will order diabetic shoes for him

## 2018-10-25 NOTE — Assessment & Plan Note (Signed)
-  This problem is chronic and stable -Patient states that over the winter break he was using his albuterol inhaler more frequently and ran out of it about a month ago -Denies any shortness of breath or wheezing currently -Patient is attempting to quit smoking and tomorrow is his quit day -We will refill his albuterol inhaler for now

## 2018-10-25 NOTE — Assessment & Plan Note (Signed)
-  This problem is chronic and stable -Patient's PHQ 9 score is 10 today up from 9 on previous visit -We will continue with Cymbalta 60 mg daily -Patient is under a lot of stress with his parents health as well as his brothers health. -We will consider referring to [facility] on follow-up visit if his PHQ 9 score remains elevated

## 2018-10-25 NOTE — Assessment & Plan Note (Signed)
-  This problem is chronic and stable -We will continue with Cymbalta 60 mg for now -Patient still uses Xanax 2-3 times a week but states that this is to help him sleep and not really secondary to his anxiety -We will taper his Xanax to 10 pills a month on his follow-up visit and eventually taper this off -He may need a sleep aid once he is off his Xanax as he states that this helps him sleep

## 2018-10-25 NOTE — Assessment & Plan Note (Signed)
BP Readings from Last 3 Encounters:  10/25/18 126/70  07/25/18 140/72  07/18/18 132/74    Lab Results  Component Value Date   NA 135 05/03/2018   K 4.4 05/03/2018   CREATININE 1.13 05/03/2018    Assessment: Blood pressure control:  Well-controlled Progress toward BP goal:   At goal Comments: Patient complains of a dry cough over the last few months which has been constant.  Patient is on lisinopril/HCTZ 20/25 mg as well as carvedilol 3.125 mg twice daily  Plan: Medications:  Will DC lisinopril/HCTZ and start the patient on losartan/HCTZ 50/12.5 mg Educational resources provided:   Self management tools provided:   Other plans: We will check BMP in 1 month on follow-up visit for repeat BP

## 2018-10-25 NOTE — Assessment & Plan Note (Signed)
-  Patient has been trying to quit smoking and has been slowly tapering off a number of cigarettes that he has been using -Tomorrow is his quit date -Patient was congratulated on this and was encouraged to continue to abstain from smoking

## 2018-10-25 NOTE — Patient Instructions (Signed)
-  It was a pleasure seeing you today -We have stopped the [place] secondary to the rash.  I have started you on Lantus instead -Also given your worsening dry cough which has been constant I have stopped the lisinopril.  I have put you on losartan instead -Please follow-up in 1 month for repeat blood pressure check as well as blood work to check your potassium level -Congratulations on stopping smoking.  Keep up the great work! -I have called in a prescription of Prilosec for you.  Please use this only as needed -Diabetes is still uncontrolled.  I have increased your Ozempic to 1 mg once weekly. -Please follow-up with rheumatologist for joint pain -Please call me if you have any questions or issues

## 2018-10-25 NOTE — Assessment & Plan Note (Addendum)
-  This problem is chronic and stable -Patient will follow-up with Dr. Johnnye Sima next week and get blood work this week -Continue with [employer] per Dr. Johnnye Sima -No further work-up at this time -ID follow-up and management appreciated

## 2018-10-25 NOTE — Assessment & Plan Note (Signed)
-  Patient complains of worsening pain in his right wrist -He had a negative Phalen's and a negative Tinel's sign -The pain in his right wrist is over the dorsal aspect which would be unusual for carpal tunnel -I suspect it may be secondary to his underlying RA -Patient was encouraged to call his rheumatologist and make a follow-up appointment with them

## 2018-10-25 NOTE — Assessment & Plan Note (Signed)
-  Patient is on Vicodin chronically for his RA pain -We will check a UDS today -I reviewed his [place] narcotic database and it was appropriate -Patient states that his pain is better controlled on the Vicodin -I have encouraged him to follow-up with his rheumatologist and hopefully get his RA under better control and eventually taper him off his Vicodin -No further work-up for now

## 2018-10-25 NOTE — Assessment & Plan Note (Signed)
-  This problem is chronic and stable -Patient denies any chest pain or shortness of breath. -Patient follow-up with cardiology in October and will follow up with them again this year -We will continue with aspirin, Plavix, carvedilol and statin for now -No further work-up at this time

## 2018-10-25 NOTE — Progress Notes (Signed)
   Subjective:    Patient ID: Riley Jones, male    DOB: 04/19/1956, 63 y.o.   MRN: [id number]  HPI  I have seen and examined this patient.  Patient is here for routine follow-up of his diabetes and hypertension.  Patient states that he has been having a rash at the sites where he injects the [place] which has been itchy and red.  Patient also notes that he has been having worsening joint pain over his right wrist on the dorsal aspect.  He has also been trying to quit smoking and tomorrow is his quit day.  He denies any other complaints at this time and states that he feels well otherwise.  He is compliant with all his medications   Review of Systems  Constitutional: Negative.   HENT: Negative.   Respiratory: Negative.   Cardiovascular: Negative.   Gastrointestinal: Negative.   Musculoskeletal: Positive for arthralgias.  Neurological: Negative.   Psychiatric/Behavioral: Negative.        Objective:   Physical Exam Constitutional:      Appearance: Normal appearance.  HENT:     Head: Normocephalic and atraumatic.     Mouth/Throat:     Mouth: Mucous membranes are moist.     Pharynx: No oropharyngeal exudate.  Neck:     Musculoskeletal: Neck supple.  Cardiovascular:     Rate and Rhythm: Normal rate and regular rhythm.     Heart sounds: Normal heart sounds.  Pulmonary:     Effort: Pulmonary effort is normal.     Breath sounds: Normal breath sounds. No wheezing or rales.  Abdominal:     General: Bowel sounds are normal. There is no distension.     Palpations: Abdomen is soft.     Tenderness: There is no abdominal tenderness.  Musculoskeletal:        General: No swelling.     Comments: Patient with a negative Phalen's and negative Tinel's sign.  He does complain of some pain and tenderness over the dorsal aspect of his right wrist.  Lymphadenopathy:     Cervical: No cervical adenopathy.  Skin:    Comments: Patient noted to have an erythematous maculopapular rash at the  [place] injection site  Neurological:     General: No focal deficit present.     Mental Status: He is alert and oriented to person, place, and time.  Psychiatric:        Mood and Affect: Mood normal.        Behavior: Behavior normal.           Assessment & Plan:  Please see problem based charting for assessment and plan:

## 2018-10-26 ENCOUNTER — Other Ambulatory Visit: Payer: Self-pay | Admitting: Dietician

## 2018-10-26 DIAGNOSIS — E1169 Type 2 diabetes mellitus with other specified complication: Secondary | ICD-10-CM

## 2018-10-26 LAB — URINE CYTOLOGY ANCILLARY ONLY
Chlamydia: NEGATIVE
Neisseria Gonorrhea: NEGATIVE

## 2018-10-26 LAB — T-HELPER CELL (CD4) - (RCID CLINIC ONLY)
CD4 % Helper T Cell: 47 % (ref 33–55)
CD4 T Cell Abs: 760 /uL (ref 400–2700)

## 2018-10-26 MED ORDER — INSULIN PEN NEEDLE 32G X 4 MM MISC: 3 refills | Status: DC

## 2018-10-26 NOTE — Telephone Encounter (Signed)
Thank you! I signed the order for it. I appreciate the help

## 2018-10-26 NOTE — Telephone Encounter (Signed)
Mr. Hangartner requests shorter pen needles

## 2018-10-26 NOTE — Telephone Encounter (Signed)
Call to patient to check on need for pen needles per Dr. Dareen Piano.  States thinks current needles are to long. Would like to get an order for shorter peen needles.  Spoke to D. Plyler who said she would take care of the request.  Sander Nephew, RN 10/26/2018 9:11 AM.

## 2018-10-27 LAB — RPR: RPR Ser Ql: NONREACTIVE

## 2018-10-27 LAB — CBC
HCT: 44 % (ref 38.5–50.0)
Hemoglobin: 15.4 g/dL (ref 13.2–17.1)
MCH: 32.8 pg (ref 27.0–33.0)
MCHC: 35 g/dL (ref 32.0–36.0)
MCV: 93.8 fL (ref 80.0–100.0)
MPV: 9.8 fL (ref 7.5–12.5)
Platelets: 181 10*3/uL (ref 140–400)
RBC: 4.69 10*6/uL (ref 4.20–5.80)
RDW: 12.8 % (ref 11.0–15.0)
WBC: 4.8 10*3/uL (ref 3.8–10.8)

## 2018-10-27 LAB — COMPREHENSIVE METABOLIC PANEL
AG Ratio: 1.6 (calc) (ref 1.0–2.5)
ALT: 21 U/L (ref 9–46)
AST: 21 U/L (ref 10–35)
Albumin: 4 g/dL (ref 3.6–5.1)
Alkaline phosphatase (APISO): 60 U/L (ref 40–115)
BUN: 21 mg/dL (ref 7–25)
CO2: 28 mmol/L (ref 20–32)
Calcium: 9.6 mg/dL (ref 8.6–10.3)
Chloride: 101 mmol/L (ref 98–110)
Creat: 1.06 mg/dL (ref 0.70–1.25)
Globulin: 2.5 g/dL (calc) (ref 1.9–3.7)
Glucose, Bld: 126 mg/dL — ABNORMAL HIGH (ref 65–99)
Potassium: 4.3 mmol/L (ref 3.5–5.3)
Sodium: 136 mmol/L (ref 135–146)
Total Bilirubin: 0.6 mg/dL (ref 0.2–1.2)
Total Protein: 6.5 g/dL (ref 6.1–8.1)

## 2018-10-27 LAB — HIV-1 RNA QUANT-NO REFLEX-BLD
HIV 1 RNA Quant: <20 copies/mL — AB
HIV-1 RNA Quant, Log: <1.3 Log copies/mL — AB

## 2018-10-27 LAB — LIPID PANEL
CHOL/HDL RATIO: 3.3 (calc) (ref <5.0)
Cholesterol: 145 mg/dL (ref <200)
HDL: 44 mg/dL (ref >40)
LDL CHOLESTEROL (CALC): 85 mg/dL
Non-HDL Cholesterol (Calc): 101 mg/dL (calc) (ref <130)
Triglycerides: 72 mg/dL (ref <150)

## 2018-10-28 ENCOUNTER — Other Ambulatory Visit: Payer: Self-pay | Admitting: Internal Medicine

## 2018-10-28 DIAGNOSIS — F411 Generalized anxiety disorder: Secondary | ICD-10-CM

## 2018-10-28 DIAGNOSIS — Z79891 Long term (current) use of opiate analgesic: Secondary | ICD-10-CM

## 2018-10-28 MED ORDER — ALPRAZOLAM 0.5 MG PO TABS: 0.5000 mg | ORAL_TABLET | Freq: Every evening | ORAL | 0 refills | Status: DC | PRN

## 2018-10-28 MED ORDER — HYDROCODONE-ACETAMINOPHEN 7.5-325 MG PO TABS: 1.0000 | ORAL_TABLET | Freq: Four times a day (QID) | ORAL | 0 refills | Status: DC | PRN

## 2018-10-29 LAB — TOXASSURE SELECT,+ANTIDEPR,UR

## 2018-10-31 ENCOUNTER — Encounter: Payer: Self-pay | Admitting: Internal Medicine

## 2018-11-03 ENCOUNTER — Other Ambulatory Visit: Payer: Self-pay

## 2018-11-03 DIAGNOSIS — F32A Depression, unspecified: Secondary | ICD-10-CM

## 2018-11-03 DIAGNOSIS — F329 Major depressive disorder, single episode, unspecified: Secondary | ICD-10-CM

## 2018-11-03 MED ORDER — DULOXETINE HCL 60 MG PO CPEP: 60.0000 mg | ORAL_CAPSULE | Freq: Every day | ORAL | 1 refills | Status: DC

## 2018-11-07 ENCOUNTER — Ambulatory Visit: Payer: Medicare HMO | Admitting: Infectious Diseases

## 2018-11-07 ENCOUNTER — Ambulatory Visit: Payer: Medicare HMO | Admitting: Pharmacist

## 2018-11-08 ENCOUNTER — Ambulatory Visit (INDEPENDENT_AMBULATORY_CARE_PROVIDER_SITE_OTHER): Payer: Medicare HMO | Admitting: Infectious Diseases

## 2018-11-08 ENCOUNTER — Encounter: Payer: Self-pay | Admitting: Infectious Diseases

## 2018-11-08 ENCOUNTER — Other Ambulatory Visit: Payer: Self-pay

## 2018-11-08 VITALS — BP 165/85 | HR 56 | Temp 97.8°F | Ht 70.0 in | Wt 201.0 lb

## 2018-11-08 DIAGNOSIS — I10 Essential (primary) hypertension: Secondary | ICD-10-CM

## 2018-11-08 DIAGNOSIS — J42 Unspecified chronic bronchitis: Secondary | ICD-10-CM

## 2018-11-08 DIAGNOSIS — B0223 Postherpetic polyneuropathy: Secondary | ICD-10-CM | POA: Diagnosis not present

## 2018-11-08 DIAGNOSIS — B20 Human immunodeficiency virus [HIV] disease: Secondary | ICD-10-CM | POA: Diagnosis not present

## 2018-11-08 DIAGNOSIS — E1169 Type 2 diabetes mellitus with other specified complication: Secondary | ICD-10-CM

## 2018-11-08 DIAGNOSIS — F17200 Nicotine dependence, unspecified, uncomplicated: Secondary | ICD-10-CM

## 2018-11-08 HISTORY — DX: Postherpetic polyneuropathy: B02.23

## 2018-11-08 MED ORDER — VALACYCLOVIR HCL 1 G PO TABS: 1000.0000 mg | ORAL_TABLET | Freq: Two times a day (BID) | ORAL | 1 refills | Status: DC

## 2018-11-08 NOTE — Assessment & Plan Note (Signed)
He is going to make ophhto appt Working on control with his MDs

## 2018-11-08 NOTE — Assessment & Plan Note (Signed)
He is doing well.  No problems with biktarvy.  Has had flu, PCV13, shingles #1.  Offered/refused condoms.  rtc in 9 months

## 2018-11-08 NOTE — Assessment & Plan Note (Signed)
Encouraged to quit. 

## 2018-11-08 NOTE — Assessment & Plan Note (Signed)
Appears to be controled.  No wheeze, sob in clinic.

## 2018-11-08 NOTE — Assessment & Plan Note (Signed)
He does not have lesions on his skin.  Will give him trial of valtrex to see if this helps.  O/w consider amitryptiline or neurontin.

## 2018-11-08 NOTE — Progress Notes (Signed)
   Subjective:    Patient ID: Riley Jones, male    DOB: [birth year], 63 y.o.   MRN: [id number]  HPI 63yo M with hx of HIV+ (~74yrs), DM2 (>86yrs, on insulin) and RA (took MTX without relief, off prednisone), COPD. He has been maintained on atripla (has been on multiple previous meds).  Also hx of smoking. CAD s/p DES to mid LAD in 06/2014, his EF 55-65% at that time. He had a myoview 11-2014- low risk. At his visit 07-2017 he was changed to biktarvy.  He was also given a rx for ED- viagra.   Today he believes he has "shingles". Has had a rash on his abd, R, burns, itches, since Dec.   Has had IM f/u as well as Endo.  FSG have been up and down, still changing meds. Cost also an issue.  No problems with ART.  Breathing (allergies) have been up and down as well.   HIV 1 RNA Quant (copies/mL)  Date Value  10/25/2018 <20 DETECTED (A)  01/19/2018 39 (H)  06/16/2017 <20 DETECTED (A)   CD4 T Cell Abs (/uL)  Date Value  10/25/2018 760  01/19/2018 700  06/16/2017 720    Review of Systems  Constitutional: Negative for appetite change, chills, fever and unexpected weight change.  HENT: Positive for postnasal drip and rhinorrhea. Negative for sinus pain.   Respiratory: Positive for cough and shortness of breath.   Gastrointestinal: Negative for constipation and diarrhea.  Genitourinary: Negative for difficulty urinating.  Neurological: Positive for headaches.  Psychiatric/Behavioral: Positive for sleep disturbance.  Wt up 15# Please see HPI. All other systems reviewed and negative.     Objective:   Physical Exam Constitutional:      Appearance: Normal appearance. He is obese.  HENT:     Mouth/Throat:     Mouth: Mucous membranes are moist.     Pharynx: No oropharyngeal exudate.  Eyes:     Extraocular Movements: Extraocular movements intact.     Pupils: Pupils are equal, round, and reactive to light.  Neck:     Musculoskeletal: Normal range of motion and neck supple.    Cardiovascular:     Rate and Rhythm: Normal rate and regular rhythm.  Pulmonary:     Effort: Pulmonary effort is normal.     Breath sounds: Normal breath sounds.  Abdominal:     General: Bowel sounds are normal. There is no distension.     Palpations: Abdomen is soft.     Tenderness: There is no abdominal tenderness.  Musculoskeletal:     Right lower leg: No edema.     Left lower leg: No edema.  Skin:    General: Skin is warm and dry.  Neurological:     General: No focal deficit present.     Mental Status: He is alert.     Sensory: No sensory deficit.  Psychiatric:        Mood and Affect: Mood normal.   no diabetic foot lesions. Grossly normal light touch.         Assessment & Plan:

## 2018-11-08 NOTE — Assessment & Plan Note (Addendum)
He is out of control., occas headache.  Repeat SBP 165.  Prior to visit he had [employer], cigarette and 4 nutty buddies.  He will recheck at work ([employer]) and f/u with his PCP.

## 2018-11-09 ENCOUNTER — Telehealth: Payer: Self-pay | Admitting: *Deleted

## 2018-11-09 NOTE — Telephone Encounter (Signed)
I put in a referral to podiatry. Thanks

## 2018-11-09 NOTE — Telephone Encounter (Signed)
Spoke with Dawn at [facility] regarding diabetic shoes. States PCP would need to send referral to podiatry and then patient would schedule appt to see doctor for consult and exam and diabetic shoe specialist to be casted for custom diabetic shoes. States medicare usually covers 80% and patient's 20% would be about $80. Placed call to patient to discuss this option. No answer. Left message on VM requesting return call. Hubbard Hartshorn, RN, BSN

## 2018-11-09 NOTE — Telephone Encounter (Signed)
LVM FOR PATIENT TO RETURN CALL TO CLINIC AS TO WHERE HE IS WANTING THIS FOOT REFERRAL TO BE SENT.

## 2018-11-09 NOTE — Addendum Note (Signed)
Addended by: Aldine Contes on: 11/09/2018 12:55 PM   Modules accepted: Orders

## 2018-11-16 ENCOUNTER — Encounter: Payer: Self-pay | Admitting: *Deleted

## 2018-11-28 ENCOUNTER — Other Ambulatory Visit: Payer: Self-pay | Admitting: Internal Medicine

## 2018-11-28 DIAGNOSIS — F411 Generalized anxiety disorder: Secondary | ICD-10-CM

## 2018-11-28 DIAGNOSIS — Z79891 Long term (current) use of opiate analgesic: Secondary | ICD-10-CM

## 2018-11-28 MED ORDER — ALPRAZOLAM 0.5 MG PO TABS: 0.5000 mg | ORAL_TABLET | Freq: Every evening | ORAL | 0 refills | Status: DC | PRN

## 2018-11-28 MED ORDER — HYDROCODONE-ACETAMINOPHEN 7.5-325 MG PO TABS: 1.0000 | ORAL_TABLET | Freq: Four times a day (QID) | ORAL | 0 refills | Status: DC | PRN

## 2018-11-28 NOTE — Telephone Encounter (Signed)
Last office visit: 11/25/18 Last UDS: 10/25/18 Last Refill: written on 10/28/18 Next appt: 11/29/18

## 2018-11-29 ENCOUNTER — Encounter: Payer: Self-pay | Admitting: Internal Medicine

## 2018-11-29 ENCOUNTER — Other Ambulatory Visit: Payer: Self-pay

## 2018-11-29 ENCOUNTER — Ambulatory Visit (INDEPENDENT_AMBULATORY_CARE_PROVIDER_SITE_OTHER): Payer: Medicare HMO | Admitting: Internal Medicine

## 2018-11-29 DIAGNOSIS — Z794 Long term (current) use of insulin: Secondary | ICD-10-CM

## 2018-11-29 DIAGNOSIS — F339 Major depressive disorder, recurrent, unspecified: Secondary | ICD-10-CM | POA: Diagnosis not present

## 2018-11-29 DIAGNOSIS — F32A Depression, unspecified: Secondary | ICD-10-CM

## 2018-11-29 DIAGNOSIS — Z720 Tobacco use: Secondary | ICD-10-CM

## 2018-11-29 DIAGNOSIS — B20 Human immunodeficiency virus [HIV] disease: Secondary | ICD-10-CM | POA: Diagnosis not present

## 2018-11-29 DIAGNOSIS — G4700 Insomnia, unspecified: Secondary | ICD-10-CM | POA: Diagnosis not present

## 2018-11-29 DIAGNOSIS — M069 Rheumatoid arthritis, unspecified: Secondary | ICD-10-CM

## 2018-11-29 DIAGNOSIS — J449 Chronic obstructive pulmonary disease, unspecified: Secondary | ICD-10-CM

## 2018-11-29 DIAGNOSIS — I10 Essential (primary) hypertension: Secondary | ICD-10-CM

## 2018-11-29 DIAGNOSIS — J42 Unspecified chronic bronchitis: Secondary | ICD-10-CM

## 2018-11-29 DIAGNOSIS — E1169 Type 2 diabetes mellitus with other specified complication: Secondary | ICD-10-CM | POA: Diagnosis not present

## 2018-11-29 DIAGNOSIS — B0223 Postherpetic polyneuropathy: Secondary | ICD-10-CM | POA: Diagnosis not present

## 2018-11-29 DIAGNOSIS — F411 Generalized anxiety disorder: Secondary | ICD-10-CM | POA: Diagnosis not present

## 2018-11-29 DIAGNOSIS — F329 Major depressive disorder, single episode, unspecified: Secondary | ICD-10-CM

## 2018-11-29 DIAGNOSIS — Z79899 Other long term (current) drug therapy: Secondary | ICD-10-CM

## 2018-11-29 DIAGNOSIS — F17200 Nicotine dependence, unspecified, uncomplicated: Secondary | ICD-10-CM

## 2018-11-29 LAB — GLUCOSE, CAPILLARY: Glucose-Capillary: 121 mg/dL — ABNORMAL HIGH (ref 70–99)

## 2018-11-29 NOTE — Assessment & Plan Note (Signed)
-  Problem is chronic and stable -Patient states that this issue has improved over the last couple of months -We will continue with Cymbalta 60 mg daily for now -Patient states that he uses Xanax only 2-3 times a week but this is mainly secondary to his insomnia -We will taper his Xanax on his follow-up visit to 10 pills a month and eventually tapered this off

## 2018-11-29 NOTE — Assessment & Plan Note (Signed)
-  Patient with no she is shingles lesions on his skin but just intermittent pruritus and occasional burning -I do not believe that this is secondary to shingles -Given that it is winter and he has had dry skin I encouraged him to use a moisturizing lotion and see if this helps -Patient also states that he bought hydrocortisone cream and will use this as well in case the itching worsens -Patient encouraged to follow-up if this does not resolve.

## 2018-11-29 NOTE — Assessment & Plan Note (Signed)
-  This problem is chronic and stable -We will follow-up PHQ 9 score from today -Patient states that he feels better as far as this goes. -I talked him about possibly following up with Riley Jones in our clinic who is our behavioral health clinician.  He states that he will address this once he comes back from [place] -We will continue with Cymbalta 60 mg daily

## 2018-11-29 NOTE — Patient Instructions (Signed)
-  It was a pleasure seeing you today -Please follow-up with your pharmacy about your losartan/hydrochlorthiazide 50/12.5 mg.  If they do not have a refill on file please call me and I will call it in for you -I will have the pharmacist speak to you about the medication costs -Please use the hydrocortisone cream and the moisturizing lotion for the rash - Please call me if you have any questions or concerns

## 2018-11-29 NOTE — Assessment & Plan Note (Signed)
-  Patient still complains of intermittent joint pain -He has been unable to follow-up with his rheumatologist and is now going to [place] to take care of his father who is ill -He states that he will follow-up with his rheumatologist once he returns -We will continue with pain control with Tylenol as needed as well as Norco for severe pain

## 2018-11-29 NOTE — Progress Notes (Signed)
Medication Samples have been provided to the patient.  Drug name: Ozempic Strength: 2mg /1.6mL        Qty: 1   LOT: [id number] Exp.Date: 03/22  Dosing instructions:  The patient has been instructed regarding the correct time, dose, and frequency of taking this medication, including desired effects and most common side effects.      Thank you for allowing pharmacy to be a part of this patient's care.  Tamela Gammon, PharmD 11/29/2018 10:32 AM PGY-1 Pharmacy Resident Please check [email or web address] for unit-specific pharmacist phone numbers

## 2018-11-29 NOTE — Assessment & Plan Note (Signed)
-  Patient blood sugars have been well controlled -No hypoglycemic episodes noted -Patient's average blood glucose is 137 with the highest of 198 and the lowest of 91 -We will continue with Ozempic, Lantus 25 units and Synjardy 01/999 milligrams -No further work-up at this time -Please follow-up in 3 months

## 2018-11-29 NOTE — Progress Notes (Signed)
   Subjective:    Patient ID: Riley Jones, male    DOB: 09-10-1956, 63 y.o.   MRN: [id number]  HPI  I have seen and examined this patient.  Patient is here for routine follow-up of his hypertension and diabetes.  Patient states that he has been getting headaches over the last couple weeks and that his blood pressures have been running high up to the 856D and 149F systolic.  He also complains of itchiness over his back as well as his legs.  He denies any other complaints at this time.  Patient states that he is compliant with his medications.  Review of Systems  Constitutional: Negative.   HENT: Negative.   Respiratory: Negative.   Cardiovascular: Negative.   Gastrointestinal: Negative.   Musculoskeletal: Negative.   Skin: Positive for rash.  Neurological: Positive for headaches.  Psychiatric/Behavioral: Negative.        Objective:   Physical Exam Constitutional:      Appearance: Normal appearance.  HENT:     Head: Normocephalic and atraumatic.  Cardiovascular:     Rate and Rhythm: Normal rate and regular rhythm.     Heart sounds: Normal heart sounds.  Pulmonary:     Effort: Pulmonary effort is normal.     Breath sounds: Normal breath sounds.  Abdominal:     General: Bowel sounds are normal. There is no distension.     Palpations: Abdomen is soft.     Tenderness: There is no abdominal tenderness.  Musculoskeletal: Normal range of motion.        General: No swelling.  Skin:    General: Skin is warm and dry.     Comments: Patient noted to have some mild areas of erythema over his back and abdomen but no distinct rash noted.  Areas of excoriation noted over his abdomen as well as his left leg.  Neurological:     General: No focal deficit present.     Mental Status: He is alert and oriented to person, place, and time.  Psychiatric:        Mood and Affect: Mood normal.        Behavior: Behavior normal.           Assessment & Plan:  Please see problem based  charting for assessment and plan:

## 2018-11-29 NOTE — Assessment & Plan Note (Signed)
-  Patient states that he still been having issues sleeping -Encouraged the patient to try over-the-counter melatonin as needed for this -Patient states that he will attempt to use this and see if this helps with his sleep

## 2018-11-29 NOTE — Assessment & Plan Note (Signed)
-  Patient still smoking 1 pack/day -I encouraged the patient to quit smoking.  Patient has a lot of stressors in his life currently and may not be able to stop at this time. -We will follow-up with him and his next visit

## 2018-11-29 NOTE — Assessment & Plan Note (Signed)
-  This problem is chronic and stable -My appreciation to Dr. Johnnye Sima for his excellent care -Continue with [facility] for now -Patient to follow-up with Dr. Johnnye Sima in November

## 2018-11-29 NOTE — Assessment & Plan Note (Signed)
-  Patient's blood pressures noted to be elevated today with SBP's in the 160s -Patient also complains of headaches over the last 2 to 3 weeks with only minimal relief with Tylenol.  I suspect that the headaches are likely secondary to his elevated blood pressures. -On medication review it appears that the patient forgot to pick up his losartan/hydrochlorothiazide 50/12.5 mg.  I suspect that this is the reason that his blood pressure is elevated.  -Patient instructed to pick up this medication at the pharmacy and started.  If patient has any issues picking up this medication he will call me. -Continue with carvedilol 3.125 mg twice daily as well as terazosin 2 mg

## 2018-11-29 NOTE — Assessment & Plan Note (Signed)
-  This problem is chronic and stable -Patient states that he needs to use his albuterol inhaler infrequently -Smoking cessation encouraged again today -We will continue with albuterol as needed.

## 2018-12-09 ENCOUNTER — Other Ambulatory Visit: Payer: Self-pay | Admitting: Internal Medicine

## 2018-12-09 DIAGNOSIS — K219 Gastro-esophageal reflux disease without esophagitis: Secondary | ICD-10-CM

## 2018-12-09 DIAGNOSIS — M069 Rheumatoid arthritis, unspecified: Secondary | ICD-10-CM

## 2018-12-09 DIAGNOSIS — E7800 Pure hypercholesterolemia, unspecified: Secondary | ICD-10-CM

## 2018-12-09 DIAGNOSIS — I10 Essential (primary) hypertension: Secondary | ICD-10-CM

## 2018-12-20 ENCOUNTER — Other Ambulatory Visit: Payer: Self-pay | Admitting: Internal Medicine

## 2018-12-20 DIAGNOSIS — Z79891 Long term (current) use of opiate analgesic: Secondary | ICD-10-CM

## 2018-12-20 DIAGNOSIS — F411 Generalized anxiety disorder: Secondary | ICD-10-CM

## 2018-12-21 ENCOUNTER — Telehealth: Payer: Self-pay | Admitting: Nurse Practitioner

## 2018-12-21 MED ORDER — HYDROCODONE-ACETAMINOPHEN 7.5-325 MG PO TABS: 1.0000 | ORAL_TABLET | Freq: Four times a day (QID) | ORAL | 0 refills | Status: DC | PRN

## 2018-12-21 MED ORDER — ALPRAZOLAM 0.5 MG PO TABS: 0.5000 mg | ORAL_TABLET | Freq: Every evening | ORAL | 0 refills | Status: DC | PRN

## 2018-12-21 NOTE — Telephone Encounter (Signed)
Discussed upcoming visit with Riley Jones - he wishes to do a phone call visit. He does not have a computer. He is not sure if his phone has face time or not but wishes to do phone call next week.   Appointment changed.   Burtis Junes, RN, [place] [address] [place], [place]  [phone]

## 2018-12-27 ENCOUNTER — Telehealth: Payer: Self-pay | Admitting: *Deleted

## 2018-12-27 NOTE — Telephone Encounter (Signed)
S/w pt about upcoming phone visit tomorrow with Kathrene Alu.  Pt does not have any concerns nor does pt need medication refills.  Did not go through medications due to pt driving to work.Current weight is 194 lb. Last bp 141/87, which pt gets checked at work.  Did mention HIV did not get into details. Will send to [place] to [place].

## 2018-12-27 NOTE — Telephone Encounter (Signed)
lvm that will call pt back to go over tomorrow's upcoming appt.

## 2018-12-28 ENCOUNTER — Encounter: Payer: Self-pay | Admitting: Nurse Practitioner

## 2018-12-28 ENCOUNTER — Other Ambulatory Visit: Payer: Self-pay | Admitting: Internal Medicine

## 2018-12-28 ENCOUNTER — Other Ambulatory Visit: Payer: Self-pay

## 2018-12-28 ENCOUNTER — Telehealth (INDEPENDENT_AMBULATORY_CARE_PROVIDER_SITE_OTHER): Payer: Medicare HMO | Admitting: Nurse Practitioner

## 2018-12-28 VITALS — BP 148/88 | HR 59 | Temp 97.8°F | Wt 196.0 lb

## 2018-12-28 DIAGNOSIS — F17200 Nicotine dependence, unspecified, uncomplicated: Secondary | ICD-10-CM | POA: Diagnosis not present

## 2018-12-28 DIAGNOSIS — E7849 Other hyperlipidemia: Secondary | ICD-10-CM

## 2018-12-28 DIAGNOSIS — E785 Hyperlipidemia, unspecified: Secondary | ICD-10-CM

## 2018-12-28 DIAGNOSIS — F411 Generalized anxiety disorder: Secondary | ICD-10-CM

## 2018-12-28 DIAGNOSIS — I2510 Atherosclerotic heart disease of native coronary artery without angina pectoris: Secondary | ICD-10-CM | POA: Diagnosis not present

## 2018-12-28 DIAGNOSIS — I10 Essential (primary) hypertension: Secondary | ICD-10-CM

## 2018-12-28 DIAGNOSIS — E119 Type 2 diabetes mellitus without complications: Secondary | ICD-10-CM

## 2018-12-28 DIAGNOSIS — Z79891 Long term (current) use of opiate analgesic: Secondary | ICD-10-CM

## 2018-12-28 MED ORDER — CLOPIDOGREL BISULFATE 75 MG PO TABS: 75.0000 mg | ORAL_TABLET | Freq: Every day | ORAL | 3 refills | Status: DC

## 2018-12-28 MED ORDER — CARVEDILOL 3.125 MG PO TABS: ORAL_TABLET | ORAL | 3 refills | Status: DC

## 2018-12-28 MED ORDER — NITROGLYCERIN 0.4 MG SL SUBL: SUBLINGUAL_TABLET | SUBLINGUAL | 3 refills | Status: DC

## 2018-12-28 NOTE — Patient Instructions (Addendum)
After Visit Summary:  We will be checking the following labs today - NONE    Medication Instructions:    Continue with your current medicines.   I sent in your refills today for NTG, Plavix and Coreg    If you need a refill on your cardiac medications before your next appointment, please call your pharmacy.     Testing/Procedures To Be Arranged:  N/A  Follow-Up:   See me in 6 months    At [facility], you and your health needs are our priority.  As part of our continuing mission to provide you with exceptional heart care, we have created designated Provider Care Teams.  These Care Teams include your primary Cardiologist (physician) and Advanced Practice Providers (APPs -  Physician Assistants and Nurse Practitioners) who all work together to provide you with the care you need, when you need it.  Special Instructions:  . Stay safe, stay home and wash your hands for at least 20 seconds! . Stay active . Keep working on your smoking.   Call the [place] office at [phone] if you have any questions, problems or concerns.

## 2018-12-28 NOTE — Progress Notes (Signed)
Telehealth Visit     Virtual Visit via Telephone Note     Evaluation Performed:  Follow-up visit  This visit type was conducted due to national recommendations for restrictions regarding the COVID-19 Pandemic (e.g. social distancing).  This format is felt to be most appropriate for this patient at this time.  All issues noted in this document were discussed and addressed.  No physical exam was performed (except for noted visual exam findings with Video Visits).  Please refer to the patient's chart (MyChart message for video visits and phone note for telephone visits) for the patient's consent to telehealth for [facility].  Date:  12/28/2018   ID:  Riley Jones, DOB Feb 25, 1956, MRN [id number]  Patient Location:  Home  Provider location:   Home  PCP:  Aldine Contes, MD  Cardiologist:  Servando Snare & No primary care provider on file.  Electrophysiologist:  None   Chief Complaint:  Follow up visit.   History of Present Illness:    Riley Jones is a 63 y.o. male who presents via audio/video conferencing for a telehealth visit today.  Seen for Dr. Johnsie Cancel.   He has a history of known CAD with LAD stent in 2015, ongoing tobacco use, DM-2, HIV, RA, HLD, HTN and chronic pain syndrome.   He was hospitalized in 2018 for chest pain. During that time he had mildly elevated troponin at 0.03>0.04. His EKG showed RBBB with non-specific T wave abnormalities. Chest CT on 12/09/2016 demonstrated aortic atherosclerosis of the left main, LAD, LCX and RCA. There was also calcifications of the aortic and mitral annulus. Echocardiogram performed 02/28/2017 with LVEF of 60-65% with NWMA. He underwent a Lexiscan myoview performed 03/10/2017 with LVEF estimated at 53%, considered a normal, low risk study with no signs of ischemia or infarct. He has been managed medically since.   He last saw Dr. Johnsie Cancel in 11/2016. He has followed with APP's since - last by Kathyrn Drown in October of 2019. He has been  doing ok - trying to cut back on tobacco use - was transitioning over to insulin pump - no chest pain noted.   The patient does not have symptoms concerning for COVID-19 infection (fever, chills, cough, or new shortness of breath).   Seen today via phone conversation. Consent for conversation given. No chest pain. 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 He was suppose to move to [place] to take care of his mother's estate - this has been put on hold due to the outbreak in [place]. BP was high previously - had forgotten some of his medicines - but now back on and BP has been doing ok. Was up some yesterday but this was in the setting of being upset with the customer in the store coughing everywhere. No chest pain. Breathing is stable. He is trying to smoke less and actually thinking of stopping. He has been outside more - has some chickens - getting more activity. Does need some medicines refilled. Did not transition to insulin pump - working with PCP to get his sugars down. Overall, he feels like he is doing ok with no cardiac concerns at this time. Has had some depression with parent's death.   Past Medical History:  Diagnosis Date  . Anxiety   . Arthritis    "pretty much all over"  . Asthma    "mild" (07/17/2014)  . Atypical angina ([place])  Myoview 2003, Normal EF 64%  . CAD (coronary artery disease)    a. 10/20 DES to 80% mid LAD  . COPD (chronic obstructive pulmonary disease) ([place])    "mild" (07/17/2014)  . Depression   . Foot pain 03/30/2011  . GERD (gastroesophageal reflux disease)   . H/O hiatal hernia   . Headache    "weekly" (06/2014)  . History of gout   . HIV (human immunodeficiency virus infection) ([place]) 1995   DX after shingles followed by Dr. Ola Spurr (ID)  . Hx of cardiovascular stress test    ETT-Myoview (3/16):  Ex 10:15, No Ischemia, EF 57%; Normal Study  . Hyperlipidemia    . Hypertension    Well Controlled off meds  . Kidney stones    "passed them all"  . Pneumonia 4-5 times  . Rheumatoid arthritis(714.0)    Followed by Dr. Charlestine Night, off MTX and prdnisone since 2007  . Sleep apnea    does not wear mask (07/17/2014)  . Type II diabetes mellitus ([place])    Past Surgical History:  Procedure Laterality Date  . APPENDECTOMY  ~ 1981  . CARPAL TUNNEL RELEASE Left 1990's  . CORONARY ANGIOPLASTY WITH STENT PLACEMENT  07/17/2014   a. DES to 80% mid LAD  . HERNIA REPAIR    . LAPAROSCOPIC INCISIONAL / UMBILICAL / VENTRAL HERNIA REPAIR  1990's   "it was a double; not inguinal"  . LEFT HEART CATHETERIZATION WITH CORONARY ANGIOGRAM N/A 07/17/2014   Procedure: LEFT HEART CATHETERIZATION WITH CORONARY ANGIOGRAM;  Surgeon: [name], MD;  Location: [facility] CATH LAB;  Service: Cardiovascular;  Laterality: N/A;  . PERCUTANEOUS STENT INTERVENTION  07/17/2014   Procedure: PERCUTANEOUS STENT INTERVENTION;  Surgeon: [name], MD;  Location: [facility] CATH LAB;  Service: Cardiovascular;;  . TONSILLECTOMY AND ADENOIDECTOMY  1990's  . TRIGGER FINGER RELEASE Left 1990's   2nd & 5th digits  . UMBILICAL HERNIA REPAIR  1980's   w/mesh  . UVULOPALATOPHARYNGOPLASTY (UPPP)/TONSILLECTOMY/SEPTOPLASTY  1990's     Current Meds  Medication Sig  . albuterol (VENTOLIN HFA) 108 (90 Base) MCG/ACT inhaler Inhale 1 puff into the lungs every 4 (four) hours as needed. for shortness of breath. Or may use every 6 hours as needed.  . ALPRAZolam (XANAX) 0.5 MG tablet Take 1 tablet (0.5 mg total) by mouth at bedtime as needed for anxiety.  Marland Kitchen aspirin 81 MG EC tablet Take 81 mg by mouth daily.    Marland Kitchen atorvastatin (LIPITOR) 40 MG tablet TAKE 1 TABLET BY MOUTH ONCE DAILY  . BIKTARVY 50-200-25 MG TABS tablet TAKE 1 TABLET BY MOUTH ONCE DAILY  . carvedilol (COREG) 3.125 MG tablet TAKE 1 TABLET BY MOUTH TWICE DAILY WITH A MEAL  . clopidogrel (PLAVIX) 75 MG tablet Take 1 tablet (75 mg total) by mouth daily.   . clotrimazole-betamethasone (LOTRISONE) cream APPLY  CREAM TOPICALLY TWICE DAILY  . DULoxetine (CYMBALTA) 60 MG capsule Take 1 capsule (60 mg total) by mouth daily.  . Empagliflozin-metFORMIN HCl ER (SYNJARDY XR) 01-999 MG TB24 Take 1 tablet by mouth 2 (two) times daily with a meal.  . fluticasone (FLONASE) 50 MCG/ACT nasal spray Place 1 spray into both nostrils daily.  . folic acid (FOLVITE) 1 MG tablet TAKE 1 TABLET BY MOUTH ONCE DAILY  . glucose blood (ACCU-CHEK SMARTVIEW) test strip CHECK BLOOD SUGAR 4 TIMES A DAY. diag code E11.69. Insulin dependent  . HYDROcodone-acetaminophen (NORCO) 7.5-325 MG tablet Take 1 tablet by mouth every 6 (six) hours as needed for  moderate pain.  . Insulin Glargine (LANTUS SOLOSTAR) 100 UNIT/ML Solostar Pen Inject 25 Units into the skin daily at 10 pm.  . loratadine (CLARITIN) 10 MG tablet Take 1 tablet (10 mg total) by mouth daily.  Marland Kitchen losartan-hydrochlorothiazide (HYZAAR) 50-12.5 MG tablet Take 1 tablet by mouth daily.  Marland Kitchen omeprazole (PRILOSEC) 20 MG capsule TAKE 1 CAPSULE BY MOUTH AS NEEDED FOR GERD  . OZEMPIC, 1 MG/DOSE, 2 MG/1.5ML SOPN   . terazosin (HYTRIN) 2 MG capsule TAKE 1 CAPSULE BY MOUTH AT BEDTIME  . valACYclovir (VALTREX) 1000 MG tablet Take 1 tablet (1,000 mg total) by mouth 2 (two) times daily.  . [DISCONTINUED] carvedilol (COREG) 3.125 MG tablet TAKE 1 TABLET BY MOUTH TWICE DAILY WITH A MEAL  . [DISCONTINUED] clopidogrel (PLAVIX) 75 MG tablet Take 1 tablet (75 mg total) by mouth daily.     Allergies:   Penicillins; Zoloft [sertraline hcl]; Victoza [liraglutide]; Chantix [varenicline]; Lisinopril; and [name] [insulin degludec]   Social History   Tobacco Use  . Smoking status: Current Every Day Smoker    Packs/day: 1.00    Years: 46.00    Pack years: 46.00  . Smokeless tobacco: Never Used  Substance Use Topics  . Alcohol use: Not Currently    Alcohol/week: 1.0 standard drinks    Types: 1 Glasses of wine per week    Comment: 07/17/2014  "glass of wine once or twice/month"  . Drug use: No     Family Hx: The patient's family history includes Diabetes in his father and mother; Heart attack in his brother and father; Heart disease in his father; Osteoporosis in his mother; Stroke in his maternal grandfather, maternal grandmother, paternal grandfather, and paternal grandmother.  ROS:   Please see the history of present illness.   All other systems reviewed are negative but does note some neuropathy. .     Objective:    Vital Signs:  BP (!) 148/88   Pulse (!) 59   Temp 97.8 F (36.6 C)   Wt 196 lb (88.9 kg)   SpO2 100%   BMI 28.12 kg/m    Wt Readings from Last 3 Encounters:  12/28/18 196 lb (88.9 kg)  11/29/18 196 lb 4.8 oz (89 kg)  11/08/18 201 lb (91.2 kg)    Alert and responsive. Appropriate to questions. No acute distress. Not short of breath with conversation.    Labs/Other Tests and Data Reviewed:    Lab Results  Component Value Date   WBC 4.8 10/25/2018   HGB 15.4 10/25/2018   HCT 44.0 10/25/2018   PLT 181 10/25/2018   GLUCOSE 126 (H) 10/25/2018   CHOL 145 10/25/2018   TRIG 72 10/25/2018   HDL 44 10/25/2018   LDLCALC 85 10/25/2018   ALT 21 10/25/2018   AST 21 10/25/2018   NA 136 10/25/2018   K 4.3 10/25/2018   CL 101 10/25/2018   CREATININE 1.06 10/25/2018   BUN 21 10/25/2018   CO2 28 10/25/2018   TSH 1.242 12/14/2011   PSA 1.09 05/07/2009   INR 0.9 07/10/2014   HGBA1C 8.5 (A) 10/25/2018   MICROALBUR 1.9 11/22/2014     BNP (last 3 results) No results for input(s): BNP in the last 8760 hours.  ProBNP (last 3 results) No results for input(s): PROBNP in the last 8760 hours.    Prior CV studies:    The following studies were reviewed today:  Echo Study Conclusions 02/2017  - Left ventricle: The cavity size was normal. Wall thickness was  increased in a pattern of mild LVH. Systolic function was normal.   The estimated ejection fraction was in the range of 60% to 65%.   Wall  motion was normal; there were no regional wall motion   abnormalities. - Aortic valve: Trileaflet; mildly calcified leaflets. - Mitral valve: Calcified annulus. - Right ventricle: The cavity size was mildly dilated. Wall   thickness was normal. Systolic function was normal.  Myoview Study Result 02/2017    There was no ST segment deviation noted during stress.  The study is normal.  This is a low risk study.  Nuclear stress EF: 53%.   Low risk stress nuclear study with normal perfusion and low normal left ventricular global systolic function.     ASSESSMENT & PLAN:    1.  CAD - prior LAD stent from 2015 - no active chest pain noted. NTG refilled today along with his Coreg and Plavix. CV risk factor modification encouraged.   2. Ongoing tobacco abuse - reports smoking less and thinking of trying to stop altogether.   3. DM - per PCP  4. HLD - labs from January of 2020 noted - LDL is 85. Remains on statin. Trying to work on diet/weight/exercise as well.   5. COVID-19 Education: The signs and symptoms of COVID-19 were discussed with the patient and how to seek care for testing (follow up with PCP or arrange E-visit).  The importance of social distancing, staying at home and hand hygiene were discussed today.  Patient Risk:   After full review of this patient's clinical status, I feel that they are at least moderate risk at this time.  Time:   Today, I have spent 15 minutes with the patient with telehealth technology discussing the above issues.     Medication Adjustments/Labs and Tests Ordered: Current medicines are reviewed at length with the patient today.  Concerns regarding medicines are outlined above.   Tests Ordered: No orders of the defined types were placed in this encounter.   Medication Changes: Meds ordered this encounter  Medications  . carvedilol (COREG) 3.125 MG tablet    Sig: TAKE 1 TABLET BY MOUTH TWICE DAILY WITH A MEAL    Dispense:  180 tablet     Refill:  3    Order Specific Question:   Supervising Provider    Answer:   Martinique, PETER M [[id number]]  . nitroGLYCERIN (NITROSTAT) 0.4 MG SL tablet    Sig: PLACE 1 TABLET UNDER THE TONGUE EVERY 5 MINUTES AS NEEDED FOR CHEST PAIN    Dispense:  25 tablet    Refill:  3    Please consider 90 day supplies to promote better adherence    Order Specific Question:   Supervising Provider    Answer:   [place]ique, PETER M [[id number]]  . clopidogrel (PLAVIX) 75 MG tablet    Sig: Take 1 tablet (75 mg total) by mouth daily.    Dispense:  90 tablet    Refill:  3    Order Specific Question:   Supervising Provider    Answer:   [place]ique, PETER M [[id number]]    Disposition:  FU with me in 6 months.   Patient is agreeable to this plan and will call if any problems develop in the interim.   Amie Critchley, NP  12/28/2018 8:16 AM    [place]

## 2019-01-25 ENCOUNTER — Other Ambulatory Visit: Payer: Self-pay | Admitting: Internal Medicine

## 2019-01-25 DIAGNOSIS — F411 Generalized anxiety disorder: Secondary | ICD-10-CM

## 2019-01-25 DIAGNOSIS — Z79891 Long term (current) use of opiate analgesic: Secondary | ICD-10-CM

## 2019-01-25 MED ORDER — ALPRAZOLAM 0.5 MG PO TABS: 0.5000 mg | ORAL_TABLET | Freq: Every evening | ORAL | 0 refills | Status: DC | PRN

## 2019-01-25 MED ORDER — HYDROCODONE-ACETAMINOPHEN 7.5-325 MG PO TABS: 1.0000 | ORAL_TABLET | Freq: Four times a day (QID) | ORAL | 0 refills | Status: DC | PRN

## 2019-01-31 DIAGNOSIS — H16123 Filamentary keratitis, bilateral: Secondary | ICD-10-CM | POA: Diagnosis not present

## 2019-01-31 DIAGNOSIS — H2513 Age-related nuclear cataract, bilateral: Secondary | ICD-10-CM | POA: Diagnosis not present

## 2019-01-31 DIAGNOSIS — Z7984 Long term (current) use of oral hypoglycemic drugs: Secondary | ICD-10-CM | POA: Diagnosis not present

## 2019-01-31 DIAGNOSIS — E119 Type 2 diabetes mellitus without complications: Secondary | ICD-10-CM | POA: Diagnosis not present

## 2019-01-31 LAB — HM DIABETES EYE EXAM

## 2019-02-01 ENCOUNTER — Encounter: Payer: Self-pay | Admitting: *Deleted

## 2019-02-07 ENCOUNTER — Other Ambulatory Visit: Payer: Self-pay | Admitting: *Deleted

## 2019-02-07 MED ORDER — RELION PEN NEEDLES 32G X 4 MM MISC: 2 refills | Status: DC

## 2019-02-14 ENCOUNTER — Telehealth: Payer: Self-pay | Admitting: Pharmacy Technician

## 2019-02-14 DIAGNOSIS — E1169 Type 2 diabetes mellitus with other specified complication: Secondary | ICD-10-CM

## 2019-02-14 NOTE — Telephone Encounter (Signed)
Diabetes Management Follow Up  Riley Jones is a 63 y.o. male who is contacted by clinical pharmacist for help with diabetes medication management. Patient identity verified using 2 patient identifiers.  Diabetes medications: Lantus 25 units daily, Ozempic 0.5 mg weekly, Synjardy XR 5-1000mg  bid - Patient has been taking medications as prescribed but cannot afford Ozempic as it is >$300 on his [employer]. He has been getting samples of this from his physician and has to inject a 0.25 mg dose twice in succession in order to get the 0.5mg  dose. - Mr. [name] is happy with his medication regimen and reports some weight loss that he attributes to Ozempic.  Last A1c 8.5% in Jan 2020. Home BG average 130-140s mg/dL (correlates with A1C of around 6.5%) - Patient states that he tries to check twice a day but admits to often forgetting to check it  - Hypoglycemia: no symptoms lately but normally feels dizzy or nauseous if low. No BG readings < 70 mg/dL in past several weeks - Hyperglycemia: no symptoms, no BG readings> 300 mg/dL  in past several weeks  Patient reports having pain and tingling in his feet but thinks that this may be his rheumatoid arthritis flaring up because he has not taken his medication for RA in awhile. He states that he talked to his doctor and was supposed to see a podiatrist about it, but he has not done so yet.    A/P Blood glucose control: stable   Reviewed appropriate home blood glucose monitoring:   Interventions today: - Medication access: Gathered patient insurance and annual income information to help with Ozempic affordability. Will obtain options for patient assistance to hopefully get copay assistance from the manufacturer so patient doesn't have to rely on samples. - Disease state education: Patient educated on micro- and macrovascular changes that occur as a result of diabetes, including CV events and diabetic neuropathy. Patient encouraged to follow up with  physician/podiatrist for in-person evaluation of pain and tingling in feet. Patient also educated on the need to pay extra attention to his feet as a diabetic patient, including the need to inspect feet daily for injury and to keep feet clean and dry. - Cardiovascular benefits of medication regimen reviewed with patient  Advised to contact clinic or seek medical attention if concerns or symptoms arise. The patient verbalized understanding of information provided by repeating back concepts discussed.   Follow-up - Will notify PCP of findings. - Pharmacist will investigate options for medication access assistance and call patient back with findings next week.

## 2019-02-17 ENCOUNTER — Other Ambulatory Visit: Payer: Self-pay | Admitting: Infectious Diseases

## 2019-02-21 NOTE — Addendum Note (Signed)
Addended by: Forde Dandy on: 02/21/2019 03:31 PM   Modules accepted: Orders

## 2019-02-22 ENCOUNTER — Telehealth: Payer: Self-pay | Admitting: Pharmacy Technician

## 2019-02-22 ENCOUNTER — Other Ambulatory Visit: Payer: Self-pay | Admitting: Internal Medicine

## 2019-02-22 ENCOUNTER — Other Ambulatory Visit: Payer: Self-pay | Admitting: *Deleted

## 2019-02-22 DIAGNOSIS — Z79891 Long term (current) use of opiate analgesic: Secondary | ICD-10-CM

## 2019-02-22 DIAGNOSIS — F411 Generalized anxiety disorder: Secondary | ICD-10-CM

## 2019-02-22 MED ORDER — ALPRAZOLAM 0.5 MG PO TABS: 0.5000 mg | ORAL_TABLET | Freq: Every evening | ORAL | 0 refills | Status: DC | PRN

## 2019-02-22 MED ORDER — HYDROCODONE-ACETAMINOPHEN 7.5-325 MG PO TABS: 1.0000 | ORAL_TABLET | Freq: Four times a day (QID) | ORAL | 0 refills | Status: DC | PRN

## 2019-02-22 NOTE — Patient Outreach (Signed)
[facility]) Care Management  02/22/2019  Riley Jones 09-03-1956 [id number]   Telephone Screen  Referral Date:02/21/19 Referral Source: MD referral - Riley Jones  Referral Reason: Please engage Riley Jones for a needs assessment per the following referral. -help with Ozempic patient assistance program   Dx of Diabetes   Note Text:  Requesting pharmacy technician support  Insurance: [employer] medicare  No admissions in the last 6 months    Outreach attempt # 1 No answer. [facility] RN CM left HIPAA compliant voicemail message along with CM's contact info.    Conditions: Diabetes, HTN, CAD, COPD, chroinc allergic rhinitis, GERD, carpal tunnel syndrome, shinglesRheumatoid arthritis, anxiety, HIV, depression, insomnia, erectile dysfunction, tobacco use disorder, right shoulder pain, chronic Rx opiate use   Medications: Pharmacist 02/14/19 note states he cannot afford Ozempic as it is >$300 on his [employer]. Samples of this from MD  has to inject a 0.25 mg dose twice in succession in order to get the 0.5mg  dose. Happy with his medication regimen and reports some weight loss that he attributes to Ozempic.  Last A1c 8.5% in Jan 2020.Tries to check twice a day but admits to often forgets  Plan: [facility] RN CM sent an unsuccessful outreach letter and scheduled this patient for another call attempt within 4 business days   Riley Jones L. Noelle Penner, RN, BSN, CCM [facility] Telephonic Care Management Care Coordinator Office number [phone] Mobile number [phone]  Main [facility] number [phone] Fax number [phone]

## 2019-02-23 ENCOUNTER — Other Ambulatory Visit: Payer: Self-pay | Admitting: *Deleted

## 2019-02-23 NOTE — Patient Outreach (Addendum)
  [facility]) Care Management  02/23/2019  DERAN BARRO 08/28/1956 [id number]   Telephone Screen  Referral Date:02/21/19 Referral Source: MD referral - Forde Dandy  Referral Reason: Please engage Mr. Cobin for a needs assessment per the following referral. -help with Ozempic patient assistance program   Dx of Diabetes   Note Text:  Requesting pharmacy technician support  Insurance: [employer] medicare  No admissions in the last 6 months    Outreach attempt # 2  No answer. [facility] RN CM left HIPAA compliant voicemail message along with CM's contact info.   Plan: [facility] RN CM sent an unsuccessful outreach letter on 02/22/19  [facility] RN CM will scheduled this patient for a final call attempt within 4 business days  Routed note to MD  [place]. Lavina Hamman, RN, BSN, [place] Coordinator Office number [phone] Mobile number [phone]  Main [facility] number [phone] Fax number [phone]

## 2019-02-25 DIAGNOSIS — Z20828 Contact with and (suspected) exposure to other viral communicable diseases: Secondary | ICD-10-CM | POA: Diagnosis not present

## 2019-02-27 ENCOUNTER — Other Ambulatory Visit: Payer: Self-pay

## 2019-02-27 ENCOUNTER — Other Ambulatory Visit: Payer: Self-pay | Admitting: *Deleted

## 2019-02-27 NOTE — Patient Outreach (Signed)
[facility]) Care Management  02/27/2019  Riley Jones September 26, 1956 [id number]   Referral Date: 02/21/19 Referral Source:MD referral -Forde Dandy Referral Reason:Please engage Riley Jones for a needs assessment per the following referral. -help with Ozempic patient assistance program   Dxof Diabetes   Note Text:  Requesting pharmacy technician support  Insurance:[employer] medicare  No admissions in the last 6 months   Outreach attempt # 3 successful at the home/mobile number   Patient is able to verify HIPAA Reviewed and addressed MD referral to [facility] with patient   Riley Jones confirms with [facility] RN CM that he needs assistance with Ozempic only at this time He is at work during this call and is only able to speak briefly.  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 He reports his MD gave samples to him to assist with figuring out if Ozempic would assist him with his DM and until he could find a way to "get it cheaper". He  reports he has not been offered patient assistance information/application from his MD office.  He reports checking on the cost of Ozempic and found out it would be about $300 He confirms he can not afford this   He also reported that bikitarvy for HIV is expensive but he is now using a patient assistance program He is getting it for "free"   Appointments: Cardiology  6 month f/u in September 2020  Infectious disease f/u in November 2020   Advance Directives: Denies need for assist with advance directives     Consent: [facility] RN CM reviewed [facility] services with patient. Patient gave verbal consent for services [facility] telephonic RN CM and [facility] pharmacy.  Plan: [facility] RN CM will refer Riley Jones to [place] for medication assistance with [facility] RN CM will close case the [facility] telephonic encounter at this time     Pt encouraged to return a call to [facility] RN CM prn  Routed note to MD  [place]ef[place]. Lavina Hamman, RN, BSN, [place] Coordinator Office number [phone] Mobile number [phone]  Main [facility] number [phone] Fax number [phone]

## 2019-02-28 ENCOUNTER — Ambulatory Visit: Payer: Self-pay | Admitting: Pharmacist

## 2019-03-07 ENCOUNTER — Ambulatory Visit: Payer: Self-pay | Admitting: Pharmacist

## 2019-03-08 ENCOUNTER — Other Ambulatory Visit: Payer: Self-pay | Admitting: Pharmacist

## 2019-03-08 NOTE — Progress Notes (Signed)
Contacted patient for follow up help with DM med, patient reports adherence to [place], [place], and [place], but reports challenges with cost. Appointment scheduled for 6/15 for samples and will consider labs that day as well. Home BG reported in the 100s, no symptoms of concern today. Will see patient in clinic in 6/15 and notify PCP.

## 2019-03-09 ENCOUNTER — Other Ambulatory Visit: Payer: Self-pay | Admitting: Internal Medicine

## 2019-03-09 DIAGNOSIS — K219 Gastro-esophageal reflux disease without esophagitis: Secondary | ICD-10-CM

## 2019-03-13 ENCOUNTER — Ambulatory Visit (INDEPENDENT_AMBULATORY_CARE_PROVIDER_SITE_OTHER): Payer: Medicare HMO | Admitting: Pharmacist

## 2019-03-13 ENCOUNTER — Other Ambulatory Visit: Payer: Self-pay

## 2019-03-13 DIAGNOSIS — E1169 Type 2 diabetes mellitus with other specified complication: Secondary | ICD-10-CM | POA: Diagnosis not present

## 2019-03-13 LAB — GLUCOSE, CAPILLARY: Glucose-Capillary: 108 mg/dL — ABNORMAL HIGH (ref 70–99)

## 2019-03-13 LAB — POCT GLYCOSYLATED HEMOGLOBIN (HGB A1C): Hemoglobin A1C: 7.6 % — AB (ref 4.0–5.6)

## 2019-03-13 NOTE — Progress Notes (Signed)
Patient came in for DM management. His 7 days BG average is 156 mg/dL. Patient reported tingling in his feet and shooting pain in his leg mostly at night. Will need a referral to a podiatrist.   Samples of Ozempic were given to the patient, quantity 1 pen, Lot Number [id number] Exp: 12/2020.

## 2019-03-13 NOTE — Patient Outreach (Signed)
[place] [facility]) Care Management  [place]   03/13/2019  Riley Jones 10/30/1955 [id number]  Reason for referral: Medication Assistance with ozempic, synjardy, basaglar  Referral source: [facility] RN Current insurance: [employer]  PMHx includes but not limited to:  T2DM, COPD, GERD, CAD, HTN  Outreach:  Successful telephone call with [name].  HIPAA identifiers verified.  Patient is under the care of [name] where he is being followed by Flossie Dibble, PharmD and colleagues.  Medication regimen has already been addressed by that team. Riley Jones assisting with paperwork.  Patient has had issues with other GLP-1s (Victoza, listed as allergy), therefore will not switch regimen.  Basaglar ad Riley Jones are both offered by Riley Jones patient assistance programs.  Will also apply for Ozempic via Riley Jones   Patient agreeable to participate.  Objective: The 10-year ASCVD risk score Riley Jones DC Riley Jones., et al., 2013) is: 36.5%   Values used to calculate the score:     Age: 63 years     Sex: Male     Is Non-Hispanic African American: No     Diabetic: Yes     Tobacco smoker: Yes     Systolic Blood Pressure: 660 mmHg     Is BP treated: Yes     HDL Cholesterol: 44 mg/dL     Total Cholesterol: 145 mg/dL  Lab Results  Component Value Date   HGBA1C 7.6 (A) 03/13/2019    Medications Reviewed Today    Reviewed by Barbaraann Faster, RN (Registered Nurse) on 02/27/19 at 1144  Med List Status: <None>  Medication Order Taking? Sig Documenting Provider Last Dose Status Informant  albuterol (VENTOLIN HFA) 108 (90 Base) MCG/ACT inhaler [id number]  Inhale 1 puff into the lungs every 4 (four) hours as needed. for shortness of breath. Or may use every 6 hours as needed. Aldine Contes, MD  Active   ALPRAZolam Duanne Moron) 0.5 MG tablet [id number]  Take 1 tablet (0.5 mg total) by mouth at bedtime as needed for anxiety. Aldine Contes, MD  Active   aspirin 81 MG EC  tablet [id number]  Take 81 mg by mouth daily.   [provider]  Active Self  atorvastatin (LIPITOR) 40 MG tablet [id number] No TAKE 1 TABLET BY MOUTH ONCE DAILY  Patient not taking: Reported on 02/27/2019   Aldine Contes, MD Not Taking Active   BIKTARVY 50-200-25 MG TABS tablet [id number]  Take 1 tablet by mouth once daily Campbell Riches, MD  Active   carvedilol (COREG) 3.125 MG tablet [id number]  TAKE 1 TABLET BY MOUTH TWICE DAILY WITH A MEAL Truitt Merle C, NP  Active   clopidogrel (PLAVIX) 75 MG tablet [id number]  Take 1 tablet (75 mg total) by mouth daily. Burtis Junes, NP  Active   clotrimazole-betamethasone Donalynn Furlong) cream [id number]  APPLY  CREAM TOPICALLY TWICE DAILY Campbell Riches, MD  Active   DULoxetine (CYMBALTA) 60 MG capsule [id number]  Take 1 capsule (60 mg total) by mouth daily. Aldine Contes, MD  Active   Empagliflozin-metFORMIN HCl ER (SYNJARDY XR) 01-999 MG TB24 [id number] Yes Take 1 tablet by mouth 2 (two) times daily with a meal. Aldine Contes, MD Taking Active   fluticasone (FLONASE) 50 MCG/ACT nasal spray [id number] Yes Place 1 spray into both nostrils daily. Aldine Contes, MD Taking Active   folic acid (FOLVITE) 1 MG tablet [id number] Yes TAKE 1 TABLET BY MOUTH ONCE DAILY Aldine Contes, MD Taking Active   glucose blood (ACCU-CHEK SMARTVIEW)  test strip [id number]  CHECK BLOOD SUGAR 4 TIMES A DAY. diag code E11.69. Insulin dependent Aldine Contes, MD  Active   HYDROcodone-acetaminophen (NORCO) 7.5-325 MG tablet [id number]  Take 1 tablet by mouth every 6 (six) hours as needed for moderate pain. Aldine Contes, MD  Active   Insulin Glargine (LANTUS SOLOSTAR) 100 UNIT/ML Solostar Pen [id number]  Inject 25 Units into the skin daily at 10 pm. Aldine Contes, MD  Active   loratadine (CLARITIN) 10 MG tablet [id number]  Take 1 tablet (10 mg total) by mouth daily. Aldine Contes, MD  Active   losartan-hydrochlorothiazide [facility]) 50-12.5 MG  tablet [id number]  Take 1 tablet by mouth daily. Aldine Contes, MD  Active   nitroGLYCERIN (NITROSTAT) 0.4 MG SL tablet [id number]  PLACE 1 TABLET UNDER THE TONGUE EVERY 5 MINUTES AS NEEDED FOR CHEST PAIN Burtis Junes, NP  Active   omeprazole (PRILOSEC) 20 MG capsule [id number]  TAKE 1 CAPSULE BY MOUTH AS NEEDED FOR GERD Aldine Contes, MD  Active   OZEMPIC, 1 MG/DOSE, 2 MG/1.5ML SOPN [id number]   [provider]  Active   RELION PEN NEEDLES 32G X 4 MM MISC [id number]  USE FOR UP TO 8 INJECTIONS A WEEK Narendra, Nischal, MD  Active   terazosin (HYTRIN) 2 MG capsule [id number]  TAKE 1 CAPSULE BY MOUTH AT BEDTIME Aldine Contes, MD  Active   valACYclovir (VALTREX) 1000 MG tablet [id number]  Take 1 tablet (1,000 mg total) by mouth 2 (two) times daily. Campbell Riches, MD  Active          Medication Assistance Findings:   Patient Assistance Programs: Ozempic made by [employer] & Synjardy made by [place] requirement met: Yes o Out-of-pocket prescription expenditure met:   Not Applicable - Patient has met application requirements to apply for this patient assistance program.     Plan: . I will route patient assistance letter to [place] technician who will coordinate patient assistance program application process for medications listed above.  [facility] pharmacy technician will assist with obtaining all required documents from both patient and provider(s) and submit application(s) once completed.    Regina Eck, PharmD, [place]  [phone]

## 2019-03-14 LAB — MICROALBUMIN / CREATININE URINE RATIO
Creatinine, Urine: 79.3 mg/dL
Microalb/Creat Ratio: 9 mg/g{creat} (ref 0–29)
Microalbumin, Urine: 7.4 ug/mL

## 2019-03-20 ENCOUNTER — Other Ambulatory Visit: Payer: Self-pay | Admitting: Pharmacy Technician

## 2019-03-20 NOTE — Patient Outreach (Signed)
[place] [facility]) Care Management  03/20/2019  Riley Jones 1955/11/19 [id number]                          Medication Assistance Referral  Referral From: [facility] RPh Jenne Pane  Medication/Company: Larna Daughters / Novo Nordisk Patient application portion:  Education officer, museum portion: Faxed  to Dr. Dareen Piano  Medication/Company: Nancee Liter / Ralph Leyden [employer]ares Patient application portion:  Mailed Provider application portion: Faxed  to Dr. Dareen Piano  Medication/Company: Iva Boop / [employer] Patient application portion:  Mailed Provider application portion: Faxed  to Dr. Dareen Piano   Follow up:  Will follow up with patient in 7-10 business days to confirm application(s) have been received.  Maud Deed Chana Bode [place] Certified Pharmacy Technician [place] Management Direct Dial:[phone]

## 2019-03-21 ENCOUNTER — Other Ambulatory Visit: Payer: Self-pay | Admitting: *Deleted

## 2019-03-21 MED ORDER — ACCU-CHEK SMARTVIEW VI STRP: ORAL_STRIP | 1 refills | Status: DC

## 2019-03-22 ENCOUNTER — Encounter: Payer: Self-pay | Admitting: Internal Medicine

## 2019-03-22 ENCOUNTER — Other Ambulatory Visit: Payer: Self-pay

## 2019-03-22 ENCOUNTER — Ambulatory Visit (INDEPENDENT_AMBULATORY_CARE_PROVIDER_SITE_OTHER): Payer: Medicare HMO | Admitting: Internal Medicine

## 2019-03-22 VITALS — BP 139/80 | HR 67 | Temp 97.8°F | Wt 195.6 lb

## 2019-03-22 DIAGNOSIS — M79672 Pain in left foot: Secondary | ICD-10-CM

## 2019-03-22 DIAGNOSIS — K469 Unspecified abdominal hernia without obstruction or gangrene: Secondary | ICD-10-CM | POA: Diagnosis not present

## 2019-03-22 DIAGNOSIS — Z9889 Other specified postprocedural states: Secondary | ICD-10-CM | POA: Diagnosis not present

## 2019-03-22 DIAGNOSIS — K458 Other specified abdominal hernia without obstruction or gangrene: Secondary | ICD-10-CM

## 2019-03-22 DIAGNOSIS — G8929 Other chronic pain: Secondary | ICD-10-CM

## 2019-03-22 DIAGNOSIS — E11618 Type 2 diabetes mellitus with other diabetic arthropathy: Secondary | ICD-10-CM

## 2019-03-22 DIAGNOSIS — M069 Rheumatoid arthritis, unspecified: Secondary | ICD-10-CM

## 2019-03-22 DIAGNOSIS — M79671 Pain in right foot: Secondary | ICD-10-CM | POA: Insufficient documentation

## 2019-03-22 DIAGNOSIS — Z8719 Personal history of other diseases of the digestive system: Secondary | ICD-10-CM | POA: Diagnosis not present

## 2019-03-22 NOTE — Progress Notes (Signed)
   CC: Foot pain  HPI:  [name] is a 63 y.o. M with PMHx listed below presenting for foot pain. Please see the A&P for the status of the patient's chronic medical problems.  Past Medical History:  Diagnosis Date  . Anxiety   . Arthritis    "pretty much all over"  . Asthma    "mild" (07/17/2014)  . Atypical angina ([place])    Myoview 2003, Normal EF 64%  . CAD (coronary artery disease)    a. 10/20 DES to 80% mid LAD  . COPD (chronic obstructive pulmonary disease) ([place])    "mild" (07/17/2014)  . Depression   . Foot pain 03/30/2011  . GERD (gastroesophageal reflux disease)   . H/O hiatal hernia   . Headache    "weekly" (06/2014)  . History of gout   . HIV (human immunodeficiency virus infection) ([place]) 1995   DX after shingles followed by Dr. Ola Spurr (ID)  . Hx of cardiovascular stress test    ETT-Myoview (3/16):  Ex 10:15, No Ischemia, EF 57%; Normal Study  . Hyperlipidemia   . Hypertension    Well Controlled off meds  . Kidney stones    "passed them all"  . Pneumonia 4-5 times  . Rheumatoid arthritis(714.0)    Followed by Dr. Charlestine Night, off MTX and prdnisone since 2007  . Sleep apnea    does not wear mask (07/17/2014)  . Type II diabetes mellitus ([place])    Review of Systems:  Performed and all others negative.  Physical Exam:  Vitals:   03/22/19 0951  BP: 139/80  Pulse: 67  Temp: 97.8 F (36.6 C)  TempSrc: Oral  SpO2: 99%  Weight: 195 lb 9.6 oz (88.7 kg)   Physical Exam Constitutional:      General: He is not in acute distress.    Appearance: Normal appearance.  Cardiovascular:     Rate and Rhythm: Normal rate and regular rhythm.     Pulses: Normal pulses.     Heart sounds: Normal heart sounds.  Pulmonary:     Effort: Pulmonary effort is normal. No respiratory distress.     Breath sounds: Normal breath sounds.  Abdominal:     General: Bowel sounds are normal. There is no distension.     Palpations: Abdomen is soft.     Tenderness: There is no  abdominal tenderness.     Hernia: No hernia is present.  Musculoskeletal:        General: No swelling or deformity.     Comments: Mild tenderness to palpation of toes and dorsal surface of bilateral feet. Maintain longitudinal arch with standing, horizontal arch not evaluated.  Skin:    General: Skin is warm and dry.  Neurological:     General: No focal deficit present.     Mental Status: Mental status is at baseline.    Assessment & Plan:   See Encounters Tab for problem based charting.  Patient discussed with Dr. Lynnae January

## 2019-03-22 NOTE — Assessment & Plan Note (Signed)
Patient states a new L sided abdominal hernia was noted by his rheumatologist at his last visit. He has had multiple hernias and surgeries in the past. Hernia is not present on exam today, will continue to monitor. Return precautions given.

## 2019-03-22 NOTE — Progress Notes (Signed)
Internal Medicine Clinic Attending  Case discussed with Dr. Melvin  at the time of the visit.  We reviewed the resident's history and exam and pertinent patient test results.  I agree with the assessment, diagnosis, and plan of care documented in the resident's note.  

## 2019-03-22 NOTE — Patient Instructions (Signed)
Thank you for allowing [place] to care for you  For your chronic foot pain - Continue with current shoes for now - We have made a referral to podiatry for further evaluation and fitting for new diabetic shoes   Please follow up with Dr. Dareen Piano at his next available appointment

## 2019-03-22 NOTE — Assessment & Plan Note (Addendum)
Patient report chronic bilateral foot pain that has been worsening gradually for months now. He states the nature of his foot pain, located primarily on the dorsal aspect of his foot and some pain in his toes has not changed, but the intensity of the pain has. He states the pain worsens throughout the day especially when he is on his feet a lot. The pain is worsened stretching his foot into plantar flexion. The pain was previously relieved by his diabetic shoes, but these have become less and less effective over the years. He states the shoes are 65 year old and have been resoled 4 times, but have the same inserts. He will need referral to podiatry for diabetic shoes and follow up evaluation.  Mild tenderness to palpation of toes and dorsal surface of bilateral feet. Maintain longitudinal arch with standing, horizontal arch not evaluated.  Pain suspected to be due to diabetic foot considering previous relief from diabetic shoes when they were newer. He does have history of RA, but pain pattern is not typical. He take tylenol and PRN opioid for his chronic pains. - Continue current pain regimen - Continue with current shoes - Referral to podiatry for diabetic shoes and follow up evaluation

## 2019-03-23 ENCOUNTER — Other Ambulatory Visit: Payer: Self-pay | Admitting: Internal Medicine

## 2019-03-23 DIAGNOSIS — F411 Generalized anxiety disorder: Secondary | ICD-10-CM

## 2019-03-23 DIAGNOSIS — Z79891 Long term (current) use of opiate analgesic: Secondary | ICD-10-CM

## 2019-03-24 MED ORDER — ALPRAZOLAM 0.5 MG PO TABS: 0.5000 mg | ORAL_TABLET | Freq: Every evening | ORAL | 0 refills | Status: DC | PRN

## 2019-03-24 MED ORDER — HYDROCODONE-ACETAMINOPHEN 7.5-325 MG PO TABS: 1.0000 | ORAL_TABLET | Freq: Four times a day (QID) | ORAL | 0 refills | Status: DC | PRN

## 2019-03-24 NOTE — Telephone Encounter (Signed)
Last rx written 02/22/19. Last OV 03/22/19. Next OV 7/28 with PCP. UDS 10/25/18.

## 2019-03-27 ENCOUNTER — Other Ambulatory Visit: Payer: Self-pay | Admitting: Pharmacy Technician

## 2019-03-27 NOTE — Patient Outreach (Signed)
[place] [facility]) Care Management  03/27/2019  Riley Jones August 30, 1956 [id number]   Received patient portions of patient assistance applications. Sent RPh Mannie Stabile an in basket to verify provider portion of applications had been received at office.  Maud Deed Chana Bode [place] Certified Pharmacy Technician [place] Management Direct [phone]

## 2019-03-29 ENCOUNTER — Other Ambulatory Visit: Payer: Self-pay | Admitting: Pharmacy Technician

## 2019-03-29 NOTE — Patient Outreach (Addendum)
[place] [facility]) Care Management  03/29/2019  RAJOHN HENERY Jan 07, 1956 [id number]   Received provider portion(s) of patient assistance application for Ozempic, Basaglar and Synjardy XR. Faxed Synjardy XR and Basaglar completed application and required documents into companies.  Sent in basket to Internal Medicine staff member to verify directions for Ozempic. Will submit to [employer] once verified.  Will follow up with companies in 5-7 business days to check status of application.  Maud Deed Chana Bode [place] Certified Pharmacy Technician [place] Management Direct [phone]   ADDENDUM 9:40am  Received verification of Ozempic directions and faxed completed application into [employer].  Will follow up with company in 3-5 business days.  Maud Deed Chana Bode [place] Certified Pharmacy Technician [place] Management Direct [phone]

## 2019-04-05 ENCOUNTER — Other Ambulatory Visit: Payer: Self-pay | Admitting: Pharmacy Technician

## 2019-04-05 NOTE — Patient Outreach (Signed)
[place] [facility]) Care Management  04/05/2019  Riley Jones Oct 11, 1955 [id number]    Follow up call placed to [facility] regarding patient assistance application(s) for Riley Jones confirms patient has been approved as of 7/5 until 09/28/19. Faxed script portion of application to [facility], will follow up in 2-3 business days to check shipment status.   Follow up call placed to [employer] regarding patient assistance application(s) for Riley Jones , Riley Jones confirms patient has been approved as of 7/8 until 08/28/19. Medication to arrive at providers office in 1-14 business days.  Follow up:  Will follow up with patient once update has been confirmed with [facility].  Riley Jones Certified Pharmacy Technician [place] Management Direct [phone]

## 2019-04-07 ENCOUNTER — Telehealth: Payer: Self-pay | Admitting: Pharmacist

## 2019-04-07 ENCOUNTER — Other Ambulatory Visit: Payer: Self-pay | Admitting: Pharmacy Technician

## 2019-04-07 DIAGNOSIS — E11618 Type 2 diabetes mellitus with other diabetic arthropathy: Secondary | ICD-10-CM

## 2019-04-07 MED ORDER — OZEMPIC (0.25 OR 0.5 MG/DOSE) 2 MG/1.5ML ~~LOC~~ SOPN: 0.5000 mg | PEN_INJECTOR | SUBCUTANEOUS | 11 refills | Status: DC

## 2019-04-07 MED ORDER — INSULIN GLARGINE 100 UNITS/ML SOLOSTAR PEN: 20.0000 [IU] | PEN_INJECTOR | Freq: Every day | SUBCUTANEOUS | 11 refills | Status: DC

## 2019-04-07 NOTE — Patient Outreach (Signed)
[place] [facility]) Care Management  04/07/2019  MARQUI FORMBY [birth year] [id number]   Follow up call placed to [facility]) regarding patient assistance application(s) for Reather Converse confirms that scripts have been received and medication is ready be shipped.   Successful call placed to patient regarding patient assistance update for Basaglar, Ozempic and [employer], HIPAA identifiers verified.   -Provided patient with [facility] contact number to call and set up shipping for [place] approval and informed him that medication will be shipped to Dr. Wilber Bihari office -Following up on [place] application in the next week  [employer]. Chana Bode [place] Certified Pharmacy Technician [place] Management Direct [phone]

## 2019-04-07 NOTE — Progress Notes (Signed)
Contacted patient for follow up DM med management support per PCP. Patient states he home BG are in the 100s, no symptoms of hyperglycemia, however, he is experiencing hypoglycemia once weekly. He also reports working with [facility] for free medication access through patient assistance programs (paperwork is still pending). He states he has plenty of medication supply for now. Advised patient to contact me if further assistance with med access is needed and he verbalized understanding.  Advised patient to reduce Lantus (insulin glargine) from 25 units QHS to 20 units daily in the morning. He also clarified the dose he is taking for semaglutide is 0.25 mg weekly, advised him to increase to 0.5 mg weekly as prescribed at previous visit. Patient verbalized understanding.   PCP appointment on 7/28, will sign off for now but welcome further consults in the future and advised patient to call if any medication questions or concerns arise.

## 2019-04-07 NOTE — Telephone Encounter (Signed)
Thank you.  I agree with plan.

## 2019-04-13 ENCOUNTER — Other Ambulatory Visit: Payer: Self-pay | Admitting: Pharmacy Technician

## 2019-04-13 NOTE — Patient Outreach (Signed)
[place] [facility]) Care Management  04/13/2019  MEREL SANTOLI [birth year] [id number]    Follow up call placed to [employer] regarding patient assistance application(s) for Eugene Garnet confirms patient has been approved as of 7/16 until 09/28/19. Medication to arrive at patients home in 10-14 business days.  Follow up:  Will follow up with patient in 2-3 business days to verify shipping set up of [place] and to update patient of above details.  Maud Deed Chana Bode [place] Certified Pharmacy Technician [place] Management Direct Dial:[phone]

## 2019-04-14 ENCOUNTER — Other Ambulatory Visit: Payer: Self-pay | Admitting: Internal Medicine

## 2019-04-14 DIAGNOSIS — K219 Gastro-esophageal reflux disease without esophagitis: Secondary | ICD-10-CM

## 2019-04-14 DIAGNOSIS — E7800 Pure hypercholesterolemia, unspecified: Secondary | ICD-10-CM

## 2019-04-21 ENCOUNTER — Other Ambulatory Visit: Payer: Self-pay | Admitting: Internal Medicine

## 2019-04-21 DIAGNOSIS — K219 Gastro-esophageal reflux disease without esophagitis: Secondary | ICD-10-CM

## 2019-04-24 ENCOUNTER — Other Ambulatory Visit: Payer: Self-pay | Admitting: Internal Medicine

## 2019-04-24 DIAGNOSIS — Z79891 Long term (current) use of opiate analgesic: Secondary | ICD-10-CM

## 2019-04-24 DIAGNOSIS — F411 Generalized anxiety disorder: Secondary | ICD-10-CM

## 2019-04-24 MED ORDER — HYDROCODONE-ACETAMINOPHEN 7.5-325 MG PO TABS: 1.0000 | ORAL_TABLET | Freq: Four times a day (QID) | ORAL | 0 refills | Status: DC | PRN

## 2019-04-24 MED ORDER — ALPRAZOLAM 0.5 MG PO TABS: 0.5000 mg | ORAL_TABLET | Freq: Every evening | ORAL | 0 refills | Status: DC | PRN

## 2019-04-25 ENCOUNTER — Other Ambulatory Visit: Payer: Self-pay

## 2019-04-25 ENCOUNTER — Ambulatory Visit (INDEPENDENT_AMBULATORY_CARE_PROVIDER_SITE_OTHER): Payer: Medicare HMO | Admitting: Internal Medicine

## 2019-04-25 ENCOUNTER — Encounter: Payer: Self-pay | Admitting: Internal Medicine

## 2019-04-25 VITALS — BP 140/80 | HR 60 | Temp 97.8°F | Ht 70.0 in | Wt 198.8 lb

## 2019-04-25 DIAGNOSIS — J449 Chronic obstructive pulmonary disease, unspecified: Secondary | ICD-10-CM

## 2019-04-25 DIAGNOSIS — K219 Gastro-esophageal reflux disease without esophagitis: Secondary | ICD-10-CM | POA: Diagnosis not present

## 2019-04-25 DIAGNOSIS — B20 Human immunodeficiency virus [HIV] disease: Secondary | ICD-10-CM | POA: Diagnosis not present

## 2019-04-25 DIAGNOSIS — E1169 Type 2 diabetes mellitus with other specified complication: Secondary | ICD-10-CM

## 2019-04-25 DIAGNOSIS — Z7982 Long term (current) use of aspirin: Secondary | ICD-10-CM

## 2019-04-25 DIAGNOSIS — F411 Generalized anxiety disorder: Secondary | ICD-10-CM

## 2019-04-25 DIAGNOSIS — I2510 Atherosclerotic heart disease of native coronary artery without angina pectoris: Secondary | ICD-10-CM | POA: Diagnosis not present

## 2019-04-25 DIAGNOSIS — M25551 Pain in right hip: Secondary | ICD-10-CM

## 2019-04-25 DIAGNOSIS — J309 Allergic rhinitis, unspecified: Secondary | ICD-10-CM

## 2019-04-25 DIAGNOSIS — E119 Type 2 diabetes mellitus without complications: Secondary | ICD-10-CM | POA: Diagnosis not present

## 2019-04-25 DIAGNOSIS — F329 Major depressive disorder, single episode, unspecified: Secondary | ICD-10-CM

## 2019-04-25 DIAGNOSIS — I10 Essential (primary) hypertension: Secondary | ICD-10-CM | POA: Diagnosis not present

## 2019-04-25 DIAGNOSIS — M25552 Pain in left hip: Secondary | ICD-10-CM

## 2019-04-25 DIAGNOSIS — Z7902 Long term (current) use of antithrombotics/antiplatelets: Secondary | ICD-10-CM

## 2019-04-25 DIAGNOSIS — F17200 Nicotine dependence, unspecified, uncomplicated: Secondary | ICD-10-CM

## 2019-04-25 DIAGNOSIS — M069 Rheumatoid arthritis, unspecified: Secondary | ICD-10-CM

## 2019-04-25 DIAGNOSIS — Z79891 Long term (current) use of opiate analgesic: Secondary | ICD-10-CM

## 2019-04-25 DIAGNOSIS — Z79899 Other long term (current) drug therapy: Secondary | ICD-10-CM

## 2019-04-25 DIAGNOSIS — F17210 Nicotine dependence, cigarettes, uncomplicated: Secondary | ICD-10-CM

## 2019-04-25 DIAGNOSIS — Z794 Long term (current) use of insulin: Secondary | ICD-10-CM

## 2019-04-25 DIAGNOSIS — J42 Unspecified chronic bronchitis: Secondary | ICD-10-CM

## 2019-04-25 DIAGNOSIS — F32A Depression, unspecified: Secondary | ICD-10-CM

## 2019-04-25 DIAGNOSIS — F339 Major depressive disorder, recurrent, unspecified: Secondary | ICD-10-CM

## 2019-04-25 HISTORY — DX: Pain in right hip: M25.551

## 2019-04-25 MED ORDER — MELOXICAM 7.5 MG PO TABS: 7.5000 mg | ORAL_TABLET | Freq: Every day | ORAL | 0 refills | Status: DC

## 2019-04-25 MED ORDER — BASAGLAR KWIKPEN 100 UNIT/ML ~~LOC~~ SOPN: 20.0000 [IU] | PEN_INJECTOR | Freq: Every day | SUBCUTANEOUS | 0 refills | Status: DC

## 2019-04-25 MED ORDER — DULOXETINE HCL 60 MG PO CPEP: 60.0000 mg | ORAL_CAPSULE | Freq: Every day | ORAL | 1 refills | Status: DC

## 2019-04-25 MED ORDER — CARVEDILOL 3.125 MG PO TABS: ORAL_TABLET | ORAL | 3 refills | Status: DC

## 2019-04-25 MED ORDER — OMEPRAZOLE 20 MG PO CPDR: DELAYED_RELEASE_CAPSULE | ORAL | 1 refills | Status: DC

## 2019-04-25 MED ORDER — SYNJARDY XR 5-1000 MG PO TB24: 1.0000 | ORAL_TABLET | Freq: Two times a day (BID) | ORAL | 11 refills | Status: DC

## 2019-04-25 MED ORDER — TERAZOSIN HCL 2 MG PO CAPS: 2.0000 mg | ORAL_CAPSULE | Freq: Every day | ORAL | 1 refills | Status: DC

## 2019-04-25 MED ORDER — LOSARTAN POTASSIUM-HCTZ 50-12.5 MG PO TABS: 1.0000 | ORAL_TABLET | Freq: Every day | ORAL | 1 refills | Status: DC

## 2019-04-25 NOTE — Progress Notes (Signed)
   Subjective:    Patient ID: Riley Jones, male    DOB: [birth year], 63 y.o.   MRN: [id number]  HPI  I have seen and examined this patient.  Patient is here for routine follow-up of his diabetes and hypertension.  Patient states that he is doing well currently.  He does complain of intermittent right hip pain.  States that sometimes he feels like his left hip catches and he has pain on the outer aspect of his right hip.  His blood sugars have been well controlled at home and he is able to obtain all his medications.  He denies any other complaints at this time   Review of Systems  Constitutional: Negative.   HENT: Negative.   Respiratory: Negative.   Cardiovascular: Negative.   Gastrointestinal: Negative.   Musculoskeletal: Positive for arthralgias. Negative for back pain.  Skin: Negative.   Neurological: Negative.   Psychiatric/Behavioral: Negative.        Objective:   Physical Exam Constitutional:      Appearance: Normal appearance.  HENT:     Head: Normocephalic and atraumatic.     Mouth/Throat:     Mouth: Mucous membranes are moist.     Pharynx: Oropharynx is clear.  Neck:     Musculoskeletal: Neck supple.  Cardiovascular:     Rate and Rhythm: Normal rate and regular rhythm.     Heart sounds: Normal heart sounds.  Pulmonary:     Effort: Pulmonary effort is normal.     Breath sounds: Normal breath sounds.  Abdominal:     General: Bowel sounds are normal. There is no distension.     Palpations: Abdomen is soft.     Tenderness: There is no abdominal tenderness.  Musculoskeletal:        General: Tenderness present. No swelling.     Comments: Patient with mild tenderness to palpation over his right upper gluteal region  Lymphadenopathy:     Cervical: No cervical adenopathy.  Neurological:     General: No focal deficit present.     Mental Status: He is alert and oriented to person, place, and time.  Psychiatric:        Mood and Affect: Mood normal.      Behavior: Behavior normal.           Assessment & Plan:  Please see problem based charting for assessment and plan:

## 2019-04-25 NOTE — Assessment & Plan Note (Signed)
-  This problem is chronic and stable -Patient does have intermittent joint pain but states that his pain is well controlled with current dose of Norco -He has been unable to follow-up with his rheumatologist and states that after he gets his other issues under control he will follow-up with them -We will continue pain control for now -Encourage patient to follow-up with his rheumatologist

## 2019-04-25 NOTE — Assessment & Plan Note (Signed)
-  This problem is chronic and stable -Patient's A1c last month was 7.6 -Patient's blood sugars have been averaging 186 with the highest of 248 and lowest of 125 -We will continue with insulin glargine 20 units daily as well as Synjardy 5/thousand milligrams 1 tab twice daily, Ozempic 0.5 mg weekly -No further work-up at this time

## 2019-04-25 NOTE — Assessment & Plan Note (Signed)
-  Patient continues to smoke 1 pack/day -We discussed the risks of continuing to smoke and I encouraged him to try and decrease the amount that he smoking -We will address this in detail on his follow-up visit

## 2019-04-25 NOTE — Assessment & Plan Note (Signed)
-  This problem is chronic and stable -Patient states that he uses Claritin as needed but has not been using his Flonase -He states that his allergies have been well controlled -We will DC his Flonase if he does not need this medication on his follow-up visit

## 2019-04-25 NOTE — Assessment & Plan Note (Signed)
BP Readings from Last 3 Encounters:  04/25/19 140/80  03/22/19 139/80  12/28/18 (!) 148/88    Lab Results  Component Value Date   NA 136 10/25/2018   K 4.3 10/25/2018   CREATININE 1.06 10/25/2018    Assessment: Blood pressure control:  Well controlled Progress toward BP goal:   At goal Comments: Patient is compliant with terazosin 2 mg daily, losartan/HCTZ 50/12.5 mg daily, carvedilol 3.125 mg twice daily  Plan: Medications:  continue current medications Educational resources provided:   Self management tools provided:   Other plans: We will check BMP today

## 2019-04-25 NOTE — Assessment & Plan Note (Signed)
-  Patient complains of right hip pain which is intermittent and occasionally feels like his hip "catches" -On exam patient appears to have mild tenderness over his right upper gluteal area but no issues ambulating currently and no active pain currently -I suspect this is muscular in nature and not an actual issue with his hip joint -We will prescribe a short course of Mobic to use only if needed -No further work-up at this time

## 2019-04-25 NOTE — Assessment & Plan Note (Signed)
-  This problem is chronic and stable -Patient states that he is able to do daily activities while on the pain medication but has difficulty doing use if he does not take his medication -Currently the benefits of this medication outweigh the risks -No further work-up at this time.  We will continue with Norco as needed

## 2019-04-25 NOTE — Assessment & Plan Note (Signed)
-  This problem is chronic and stable -Patient states that symptoms are well controlled on omeprazole 20 mg daily -Patient states that if he does not take this medication his symptoms recur -We will continue with omeprazole 20 mg daily -Refill placed today

## 2019-04-25 NOTE — Assessment & Plan Note (Signed)
-  This problem is chronic and stable -Patient's PHQ 9 score is 7 which is similar to his last 1 -We will continue with Cymbalta 60 mg daily -We will consider referring patient to [facility] on follow-up in our clinic if a PHQ 9 score remains elevated

## 2019-04-25 NOTE — Assessment & Plan Note (Signed)
-  This problem is chronic and stable -Patient still complains of intermittent episodes of anxiety -We will continue with Cymbalta 60 mg daily for now -Patient uses Xanax only 2-3 times a week and his last dose was 5 to 6 days ago -We will decrease his Xanax on his follow-up prescription to 15 tablets a month -The goal is to eventually taper the patient off this medication.  Patient expresses understanding and is in agreement with plan.

## 2019-04-25 NOTE — Assessment & Plan Note (Signed)
-  This problem is chronic and stable -Patient states that he needs to use his albuterol inhaler only once or twice a week usually after an activity outside like mowing the lawn -Patient also states that he still smokes approximately 1 pack/day -We will continue with albuterol as needed for now -Patient encouraged to stop smoking

## 2019-04-25 NOTE — Assessment & Plan Note (Signed)
-  This problem is chronic and stable -Patient denies any chest pain or shortness of breath -Was able to mow his lawn without any difficulty -Patient recently followed up with cardiology in April -No recent changes made to his medication -We will continue with aspirin, Plavix, carvedilol and statin for now -No further work-up at this time

## 2019-04-25 NOTE — Assessment & Plan Note (Signed)
-  This problem is chronic and stable -Patient follows up with Dr. Johnnye Sima for this -We will continue Biktarvy per Dr. Johnnye Sima

## 2019-04-25 NOTE — Patient Instructions (Addendum)
-  It was a pleasure seeing you today -I will refill your medication from this should be available to you at [facility].  Please call if you have any issues obtaining your medications -We will check a blood test as well as urine test today -Your diabetes appears to be well controlled.  Continue the great work! -Your blood pressure is better controlled today.  Please continue current medications -We will decrease her Xanax on the next refill to 15 tablets a month -Please call me with any questions or concerns

## 2019-04-26 ENCOUNTER — Telehealth: Payer: Self-pay | Admitting: Internal Medicine

## 2019-04-26 LAB — BMP8+ANION GAP
Anion Gap: 16 mmol/L (ref 10.0–18.0)
BUN/Creatinine Ratio: 16 (ref 10–24)
BUN: 15 mg/dL (ref 8–27)
CO2: 22 mmol/L (ref 20–29)
Calcium: 9.7 mg/dL (ref 8.6–10.2)
Chloride: 99 mmol/L (ref 96–106)
Creatinine, Ser: 0.93 mg/dL (ref 0.76–1.27)
GFR calc Af Amer: 100 mL/min/{1.73_m2} (ref >59)
GFR calc non Af Amer: 86 mL/min/{1.73_m2} (ref >59)
Glucose: 175 mg/dL — ABNORMAL HIGH (ref 65–99)
Potassium: 4.6 mmol/L (ref 3.5–5.2)
Sodium: 137 mmol/L (ref 134–144)

## 2019-04-26 NOTE — Telephone Encounter (Signed)
I called the patient discussed the results of his blood work with him.  His BMP was within normal limits except for mildly elevated blood glucose.  No further work-up required at this time.  Patient expresses understanding and is in agreement with plan.

## 2019-04-28 ENCOUNTER — Other Ambulatory Visit: Payer: Self-pay

## 2019-04-28 ENCOUNTER — Ambulatory Visit: Payer: Medicare HMO | Admitting: Podiatry

## 2019-04-28 ENCOUNTER — Encounter: Payer: Self-pay | Admitting: Podiatry

## 2019-04-28 VITALS — BP 151/81 | HR 67 | Temp 97.1°F

## 2019-04-28 DIAGNOSIS — D689 Coagulation defect, unspecified: Secondary | ICD-10-CM

## 2019-04-28 DIAGNOSIS — Z794 Long term (current) use of insulin: Secondary | ICD-10-CM | POA: Diagnosis not present

## 2019-04-28 DIAGNOSIS — E119 Type 2 diabetes mellitus without complications: Secondary | ICD-10-CM | POA: Insufficient documentation

## 2019-04-28 NOTE — Progress Notes (Signed)
This patient presents to the office with chief complaint of diabetic feet.  This patient  says there  is  no pain and discomfort in his  feet.      Patient has no history of infection or drainage from both feet.  . This patient presents  to the office today for treatment and  evaluation due to history of  Diabetes. Patient is taking plavix.  General Appearance  Alert, conversant and in no acute stress.  Vascular  Dorsalis pedis and posterior tibial  pulses are palpable  bilaterally.  Capillary return is within normal limits  bilaterally. Temperature is within normal limits  bilaterally.  Neurologic  Senn-Weinstein monofilament wire test within normal limits  bilaterally. Muscle power within normal limits bilaterally.  Nails Thick disfigured discolored nails with subungual debris  from hallux to fifth toes bilaterally. No evidence of bacterial infection or drainage bilaterally.  Orthopedic  No limitations of motion of motion feet .  No crepitus or effusions noted.  No bony pathology or digital deformities noted. Hallux limitus 1st MPJ  .  Arthritis midfoot  B/l.  Skin  normotropic skin with no porokeratosis noted bilaterally.  No signs of infections or ulcers noted.     Onychomycosis  Diabetes with no foot complications  IE    A diabetic foot exam was performed and there is no evidence of any vascular or neurologic pathology.   Patient does not qualify for diabetic shoes.    RTC 1 year.  Discussed proper footgear.   Gardiner Barefoot DPM

## 2019-04-29 LAB — TOXASSURE SELECT,+ANTIDEPR,UR

## 2019-05-01 ENCOUNTER — Other Ambulatory Visit: Payer: Self-pay | Admitting: Pharmacy Technician

## 2019-05-01 NOTE — Patient Outreach (Addendum)
[facility]) Care Management  05/01/2019  Riley Jones July 26, 1956 [id number]   Received voicemail from patient stating that he received 2 of his medications that were approved thru patient assistance. He states that he has not received Ozempic and states that he is out of medication.  Sent in basket message to Internal Medicine staff to verify medication has been received at office. Novo Nordisk requires medication to be shipped there.  Maud Deed Chana Bode [place] Certified Pharmacy Technician [place] Management Direct Dial:[phone]    ADDENDUM 11:00am  Incoming in basket message from [place] had arrived at office. Delsa Sale states that she will work with patient on setting up a time for him to medication up.  Return call to patient, HIPAA identifiers verified. Patient confirmed he received the Synjardy XR at home and states [employer] shipped his Nancee Liter to his job for him. Re-iterated that his approvals for the programs will expire 09/28/19 and then he would need to re-apply. Requested that he contact me if he has any additional questions or runs into issues obtaining his refills.  Will route note to [place] for case closure.  Maud Deed Chana Bode [place] Certified Pharmacy Technician [place] Management Direct Dial:[phone]

## 2019-05-02 ENCOUNTER — Ambulatory Visit: Payer: Medicare HMO | Admitting: Pharmacist

## 2019-05-02 ENCOUNTER — Other Ambulatory Visit: Payer: Self-pay

## 2019-05-02 DIAGNOSIS — E1169 Type 2 diabetes mellitus with other specified complication: Secondary | ICD-10-CM

## 2019-05-02 NOTE — Progress Notes (Signed)
Patient picked up Eagle today. He asked for me to review with him how to dial up the dose of 0.5mg . I reviewed with him this technique for administration.

## 2019-05-03 ENCOUNTER — Encounter: Payer: Self-pay | Admitting: Internal Medicine

## 2019-05-04 ENCOUNTER — Other Ambulatory Visit: Payer: Self-pay | Admitting: Internal Medicine

## 2019-05-04 ENCOUNTER — Other Ambulatory Visit: Payer: Self-pay | Admitting: Pharmacist

## 2019-05-04 MED ORDER — MELOXICAM 7.5 MG PO TABS: 7.5000 mg | ORAL_TABLET | Freq: Every day | ORAL | 0 refills | Status: DC | PRN

## 2019-05-05 NOTE — Telephone Encounter (Signed)
Called pt - asked if he will be able to pick up rx from [facility]. Stated he had [facility] to transfer rx since he has work there [employer].

## 2019-05-08 ENCOUNTER — Other Ambulatory Visit: Payer: Self-pay | Admitting: Internal Medicine

## 2019-05-08 DIAGNOSIS — M25551 Pain in right hip: Secondary | ICD-10-CM

## 2019-05-08 MED ORDER — MELOXICAM 7.5 MG PO TABS: 7.5000 mg | ORAL_TABLET | Freq: Every day | ORAL | 0 refills | Status: AC | PRN

## 2019-05-24 ENCOUNTER — Other Ambulatory Visit: Payer: Self-pay | Admitting: Internal Medicine

## 2019-05-24 DIAGNOSIS — Z79891 Long term (current) use of opiate analgesic: Secondary | ICD-10-CM

## 2019-05-24 DIAGNOSIS — F411 Generalized anxiety disorder: Secondary | ICD-10-CM

## 2019-05-25 MED ORDER — ALPRAZOLAM 0.5 MG PO TABS: 0.5000 mg | ORAL_TABLET | Freq: Every evening | ORAL | 0 refills | Status: DC | PRN

## 2019-05-25 MED ORDER — HYDROCODONE-ACETAMINOPHEN 7.5-325 MG PO TABS: 1.0000 | ORAL_TABLET | Freq: Four times a day (QID) | ORAL | 0 refills | Status: DC | PRN

## 2019-05-25 NOTE — Telephone Encounter (Signed)
Last rx written  04/24/19. Last OV  04/25/19. Next OV  Has not been scheduled. UDS  04/25/19.

## 2019-05-30 ENCOUNTER — Ambulatory Visit: Payer: Medicare HMO | Admitting: Nurse Practitioner

## 2019-06-09 NOTE — Progress Notes (Signed)
Medication Samples have been provided to the patient.  Drug name: Ozempic       Strength: 1.34 mg/mL       Qty: 2 pens LOT: [id number] Exp.Date: 06/2021  Dosing instructions: Inject 0.5 mg into the skin once a week.   The patient has been instructed regarding the correct time, dose, and frequency of taking this medication, including desired effects and most common side effects.   Riley Jones 9:43 AM 06/09/2019

## 2019-06-12 ENCOUNTER — Encounter: Payer: Self-pay | Admitting: Internal Medicine

## 2019-06-21 ENCOUNTER — Telehealth: Payer: Self-pay | Admitting: Internal Medicine

## 2019-06-21 NOTE — Telephone Encounter (Signed)
Pt would like a call back about Testosterone  Testing.

## 2019-06-21 NOTE — Telephone Encounter (Signed)
Thank you. I had sent him a message on this saying he would need further work up prior to treatment

## 2019-06-21 NOTE — Telephone Encounter (Signed)
Pt states his testosterone was low and was given cream but it was recalled and he now wants to f/u on the problem and maybe get shots. [facility] 9/24 0845

## 2019-06-21 NOTE — Telephone Encounter (Signed)
Rtc, left vmail for rtc

## 2019-06-22 ENCOUNTER — Other Ambulatory Visit: Payer: Self-pay

## 2019-06-22 ENCOUNTER — Ambulatory Visit (INDEPENDENT_AMBULATORY_CARE_PROVIDER_SITE_OTHER): Payer: Medicare HMO | Admitting: Internal Medicine

## 2019-06-22 ENCOUNTER — Encounter: Payer: Self-pay | Admitting: Internal Medicine

## 2019-06-22 VITALS — BP 131/70 | HR 67 | Temp 97.8°F | Wt 192.8 lb

## 2019-06-22 DIAGNOSIS — R5383 Other fatigue: Secondary | ICD-10-CM | POA: Diagnosis not present

## 2019-06-22 DIAGNOSIS — I2510 Atherosclerotic heart disease of native coronary artery without angina pectoris: Secondary | ICD-10-CM

## 2019-06-22 DIAGNOSIS — Z79891 Long term (current) use of opiate analgesic: Secondary | ICD-10-CM

## 2019-06-22 DIAGNOSIS — M069 Rheumatoid arthritis, unspecified: Secondary | ICD-10-CM

## 2019-06-22 DIAGNOSIS — Z6827 Body mass index (BMI) 27.0-27.9, adult: Secondary | ICD-10-CM

## 2019-06-22 DIAGNOSIS — E119 Type 2 diabetes mellitus without complications: Secondary | ICD-10-CM | POA: Diagnosis not present

## 2019-06-22 DIAGNOSIS — Z21 Asymptomatic human immunodeficiency virus [HIV] infection status: Secondary | ICD-10-CM

## 2019-06-22 DIAGNOSIS — N62 Hypertrophy of breast: Secondary | ICD-10-CM

## 2019-06-22 DIAGNOSIS — N529 Male erectile dysfunction, unspecified: Secondary | ICD-10-CM

## 2019-06-22 DIAGNOSIS — J449 Chronic obstructive pulmonary disease, unspecified: Secondary | ICD-10-CM

## 2019-06-22 DIAGNOSIS — N5201 Erectile dysfunction due to arterial insufficiency: Secondary | ICD-10-CM | POA: Diagnosis not present

## 2019-06-22 DIAGNOSIS — I10 Essential (primary) hypertension: Secondary | ICD-10-CM | POA: Diagnosis not present

## 2019-06-22 DIAGNOSIS — E669 Obesity, unspecified: Secondary | ICD-10-CM

## 2019-06-22 HISTORY — DX: Other fatigue: R53.83

## 2019-06-22 NOTE — Progress Notes (Signed)
   CC: Discuss about Testosterone shots   HPI:  [name] is a 63 y.o. living with HIV, essential htn, CAD, COPD, DM who presented to discuss possibility of getting testosterone shots. Please see problem based charting for evaluation, assessment, and plan.  Past Medical History:  Diagnosis Date  . Anxiety   . Arthritis    "pretty much all over"  . Asthma    "mild" (07/17/2014)  . Atypical angina ([place])    Myoview 2003, Normal EF 64%  . CAD (coronary artery disease)    a. 10/20 DES to 80% mid LAD  . COPD (chronic obstructive pulmonary disease) ([place])    "mild" (07/17/2014)  . Depression   . Foot pain 03/30/2011  . GERD (gastroesophageal reflux disease)   . H/O hiatal hernia   . Headache    "weekly" (06/2014)  . History of gout   . HIV (human immunodeficiency virus infection) ([place]) 1995   DX after shingles followed by Dr. Ola Spurr (ID)  . Hx of cardiovascular stress test    ETT-Myoview (3/16):  Ex 10:15, No Ischemia, EF 57%; Normal Study  . Hyperlipidemia   . Hypertension    Well Controlled off meds  . Kidney stones    "passed them all"  . Pneumonia 4-5 times  . Rheumatoid arthritis(714.0)    Followed by Dr. Charlestine Night, off MTX and prdnisone since 2007  . Sleep apnea    does not wear mask (07/17/2014)  . Type II diabetes mellitus ([place])    Review of Systems:    Review of Systems  Constitutional: Negative for chills and fever.  Respiratory: Negative for cough and shortness of breath.   Gastrointestinal: Negative for abdominal pain, nausea and vomiting.  Neurological: Negative for dizziness and headaches.   States that he has been feeling tired for the past few months. Patient states "some days its like he is walking through molasses".   Physical Exam:  Vitals:   06/22/19 0838  BP: 131/70  Pulse: 67  Temp: 97.8 F (36.6 C)  TempSrc: Oral  SpO2: 99%   Physical Exam  Constitutional: He appears well-developed and well-nourished. No distress.  HENT:  Head:  Normocephalic and atraumatic.  Eyes: Conjunctivae are normal.  Cardiovascular: Normal rate, regular rhythm and normal heart sounds.  Respiratory: Effort normal and breath sounds normal. No respiratory distress. He has no wheezes.  Mild gynecomastia  GI: Soft. Bowel sounds are normal. He exhibits no distension. There is no abdominal tenderness.  protuberant abdomen  Musculoskeletal:        General: No edema.  Skin: He is not diaphoretic.  Psychiatric: He has a normal mood and affect. His behavior is normal. Judgment and thought content normal.   Assessment & Plan:   See Encounters Tab for problem based charting.  Patient discussed with  Dr. Evette Doffing

## 2019-06-22 NOTE — Assessment & Plan Note (Signed)
States that he has been feeling tired for the past few months. Patient states "some days its like he is walking through molasses". No anemia, Last TSH 1.242 from 2013, vitamin b12 656 in 2010.   Assessment and plan  Patient's diabetes and rheumatoid arthriits can contribute for the patient's fatigue. Will check tsh.

## 2019-06-22 NOTE — Assessment & Plan Note (Signed)
Patient has had prior testosterone lab tests that were low. In 2012 the testosterone was 180 and free testosterone 35. Prior testosterone level in 2009 was also low at 348.   States that he is having erectile dysfunction during intercourse and in early morning.   Patient denies any conflict with his partner, anxiety, or depression.  Assessment and plan  Cause for patient's erectile dysfunction is likely multifactorial. He has several pre-existing conditions such as diabetes, hiv, cad, copd, and obesity which can cause him to develop ED. He is on opioid pain medication and thiazide diuretics which have been linked to causing ED. Patient's age is likely also contributory.   Spoke to patient at length about the ED likely being a vascular cause and also due to preexisting conditions, but he stated that he wanted to get testosterone level.   -2 early morning testosterone levels ordered -weight reduction  -taper opioid  -optimize glucose control -follow up 4 weeks

## 2019-06-22 NOTE — Patient Instructions (Signed)
It was a pleasure to see you today Mr. Abend. Please make the following changes:  For your erectile dysfunction: -please make sure you work on weight reduction  -please work on pain control and coming off of opioid pain medication  -we collected testosterone level for you today, please return for another test tomorrow morning   -follow up 4 weeks   If you have any questions or concerns, please call our clinic at [phone] between 9am-5pm and after hours call [phone] and ask for the internal medicine resident on call. If you feel you are having a medical emergency please call 911.   Thank you, we look forward to help you remain healthy!  Lars Mage, MD Internal Medicine PGY3

## 2019-06-23 ENCOUNTER — Other Ambulatory Visit (INDEPENDENT_AMBULATORY_CARE_PROVIDER_SITE_OTHER): Payer: Medicare HMO

## 2019-06-23 DIAGNOSIS — N5201 Erectile dysfunction due to arterial insufficiency: Secondary | ICD-10-CM

## 2019-06-23 LAB — TESTOSTERONE: Testosterone: 385 ng/dL (ref 264–916)

## 2019-06-23 LAB — TSH: TSH: 1.59 u[IU]/mL (ref 0.450–4.500)

## 2019-06-23 NOTE — Progress Notes (Signed)
Internal Medicine Clinic Attending  Case discussed with Dr. Chundi at the time of the visit.  We reviewed the resident's history and exam and pertinent patient test results.  I agree with the assessment, diagnosis, and plan of care documented in the resident's note. 

## 2019-06-24 LAB — TESTOSTERONE: Testosterone: 329 ng/dL (ref 264–916)

## 2019-06-26 ENCOUNTER — Other Ambulatory Visit: Payer: Self-pay | Admitting: Internal Medicine

## 2019-06-26 ENCOUNTER — Telehealth: Payer: Self-pay

## 2019-06-26 DIAGNOSIS — Z79891 Long term (current) use of opiate analgesic: Secondary | ICD-10-CM

## 2019-06-26 DIAGNOSIS — F411 Generalized anxiety disorder: Secondary | ICD-10-CM

## 2019-06-26 MED ORDER — ALPRAZOLAM 0.5 MG PO TABS: 0.5000 mg | ORAL_TABLET | Freq: Every evening | ORAL | 0 refills | Status: DC | PRN

## 2019-06-26 MED ORDER — HYDROCODONE-ACETAMINOPHEN 7.5-325 MG PO TABS: 1.0000 | ORAL_TABLET | Freq: Four times a day (QID) | ORAL | 0 refills | Status: DC | PRN

## 2019-06-26 NOTE — Telephone Encounter (Signed)
Hydrocodone   Last rx written 05/25/19. Last OV  06/22/19. Next OV  08/08/19. UDS  04/25/19.

## 2019-06-26 NOTE — Telephone Encounter (Signed)
Requesting lab results. Please call pt back.  

## 2019-06-27 NOTE — Telephone Encounter (Signed)
Called back patient.

## 2019-06-30 NOTE — Progress Notes (Signed)
CARDIOLOGY OFFICE NOTE  Date:  07/03/2019    Riley Jones Date of Birth: 25-Jun-1956 Medical Record [id number]  PCP:  Aldine Contes, MD  Cardiologist:  Gillian Shields    Chief Complaint  Patient presents with  . Follow-up    History of Present Illness: Riley Jones is a 63 y.o. male who presents today for a 6 month check.  Seen for Dr. Johnsie Cancel.   He has a history of known CAD with LAD stent in 2015, ongoing tobacco use, DM-2, HIV, RA, HLD, HTN and chronic pain syndrome.   He was hospitalized in 2018 for chest pain.During that time he had mildly elevated troponin at 0.03>0.04. His EKG showed RBBB with non-specific T wave abnormalities. Chest CT on 12/09/2016 demonstrated aortic atherosclerosis of the left main, LAD, LCX and RCA. There was also calcifications of the aortic and mitral annulus. Echocardiogram performed 02/28/2017 with LVEF of 60-65% with NWMA.He underwent aLexiscan myoview performed 03/10/2017 with LVEF estimated at 53%, considered a normal, low risk study with no signs of ischemia or infarct. He has been managed medically since.   He last saw Dr. Johnsie Cancel in 11/2016. He has followed with APP's since - last in the office by Kathyrn Drown in October of 2019. He has been doing ok - trying to cut back on tobacco use - was transitioning over to insulin pump - no chest pain noted. I did a telehealth visit with him back in April - lots of stress with his job at [employer] and family issues - was suppose to move to [place] to settle his mom's estate but this had been put on hold due to the pandemic. Was trying to smoke less. Did not transition to insulin pump. Overall he felt like he was doing ok.   The patient does not have symptoms concerning for COVID-19 infection (fever, chills, cough, or new shortness of breath).   Comes in today. Here alone. He is doing well. Never made it to [place]. Working at [employer] less stressed - trying to smoke less. No chest pain. Not  short of breath. Working on getting his [place] down. Not fasting today - seeing PCP in about a month and plans to have labs. Asking about stopping medicines. Asking about testosterone therapy.   Past Medical History:  Diagnosis Date  . Anxiety   . Arthritis    "pretty much all over"  . Asthma    "mild" (07/17/2014)  . Atypical angina ([place])    Myoview 2003, Normal EF 64%  . CAD (coronary artery disease)    a. 10/20 DES to 80% mid LAD  . COPD (chronic obstructive pulmonary disease) ([place])    "mild" (07/17/2014)  . Depression   . Foot pain 03/30/2011  . GERD (gastroesophageal reflux disease)   . H/O hiatal hernia   . Headache    "weekly" (06/2014)  . History of gout   . HIV (human immunodeficiency virus infection) ([place]) 1995   DX after shingles followed by Dr. Ola Spurr (ID)  . Hx of cardiovascular stress test    ETT-Myoview (3/16):  Ex 10:15, No Ischemia, EF 57%; Normal Study  . Hyperlipidemia   . Hypertension    Well Controlled off meds  . Kidney stones    "passed them all"  . Pneumonia 4-5 times  . Rheumatoid arthritis(714.0)    Followed by Dr. Charlestine Night, off MTX and prdnisone since 2007  . Sleep apnea    does not wear mask (07/17/2014)  .  Type II diabetes mellitus ([place])     Past Surgical History:  Procedure Laterality Date  . APPENDECTOMY  ~ 1981  . CARPAL TUNNEL RELEASE Left 1990's  . CORONARY ANGIOPLASTY WITH STENT PLACEMENT  07/17/2014   a. DES to 80% mid LAD  . HERNIA REPAIR    . LAPAROSCOPIC INCISIONAL / UMBILICAL / VENTRAL HERNIA REPAIR  1990's   "it was a double; not inguinal"  . LEFT HEART CATHETERIZATION WITH CORONARY ANGIOGRAM N/A 07/17/2014   Procedure: LEFT HEART CATHETERIZATION WITH CORONARY ANGIOGRAM;  Surgeon: [name], MD;  Location[facility] CATH LAB;  Service: Cardiovascular;  Laterality: N/A;  . PERCUTANEOUS STENT INTERVENTION  07/17/2014   Procedure: PERCUTANEOUS STENT INTERVENTION;  Surgeon: [name], MD;  Location: [facility] CATH LAB;  Service:  Cardiovascular;;  . TONSILLECTOMY AND ADENOIDECTOMY  1990's  . TRIGGER FINGER RELEASE Left 1990's   2nd & 5th digits  . UMBILICAL HERNIA REPAIR  1980's   w/mesh  . UVULOPALATOPHARYNGOPLASTY (UPPP)/TONSILLECTOMY/SEPTOPLASTY  1990's     Medications: Current Meds  Medication Sig  . albuterol (VENTOLIN HFA) 108 (90 Base) MCG/ACT inhaler Inhale 1 puff into the lungs every 4 (four) hours as needed. for shortness of breath. Or may use every 6 hours as needed.  . ALPRAZolam (XANAX) 0.5 MG tablet Take 1 tablet (0.5 mg total) by mouth at bedtime as needed for anxiety.  Marland Kitchen aspirin 81 MG EC tablet Take 81 mg by mouth daily.    Marland Kitchen atorvastatin (LIPITOR) 40 MG tablet Take 1 tablet by mouth once daily  . BIKTARVY 50-200-25 MG TABS tablet Take 1 tablet by mouth once daily  . carvedilol (COREG) 3.125 MG tablet TAKE 1 TABLET BY MOUTH TWICE DAILY WITH A MEAL  . clopidogrel (PLAVIX) 75 MG tablet Take 1 tablet (75 mg total) by mouth daily.  . DULoxetine (CYMBALTA) 60 MG capsule Take 1 capsule (60 mg total) by mouth daily.  . Empagliflozin-metFORMIN HCl ER (SYNJARDY XR) 01-999 MG TB24 Take 1 tablet by mouth 2 (two) times daily with a meal.  . fluticasone (FLONASE) 50 MCG/ACT nasal spray Place 1 spray into both nostrils daily.  Marland Kitchen glucose blood (ACCU-CHEK SMARTVIEW) test strip CHECK BLOOD SUGAR 4 TIMES A DAY. diag code E11.69. Insulin dependent  . HYDROcodone-acetaminophen (NORCO) 7.5-325 MG tablet Take 1 tablet by mouth every 6 (six) hours as needed for moderate pain.  . Insulin Glargine (BASAGLAR KWIKPEN) 100 UNIT/ML SOPN Inject 0.2 mLs (20 Units total) into the skin daily.  Marland Kitchen loratadine (CLARITIN) 10 MG tablet Take 1 tablet (10 mg total) by mouth daily.  Marland Kitchen losartan-hydrochlorothiazide (HYZAAR) 50-12.5 MG tablet Take 1 tablet by mouth daily.  . meloxicam (MOBIC) 7.5 MG tablet Take 1 tablet (7.5 mg total) by mouth daily as needed for pain.  . nitroGLYCERIN (NITROSTAT) 0.4 MG SL tablet PLACE 1 TABLET UNDER THE  TONGUE EVERY 5 MINUTES AS NEEDED FOR CHEST PAIN  . omeprazole (PRILOSEC) 20 MG capsule TAKE 1 CAPSULE BY MOUTH AS NEEDED  . RELION PEN NEEDLES 32G X 4 MM MISC USE FOR UP TO 8 INJECTIONS A WEEK  . Semaglutide,0.25 or 0.5MG /DOS, (OZEMPIC, 0.25 OR 0.5 MG/DOSE,) 2 MG/1.5ML SOPN Inject 0.5 mg into the skin once a week.  . terazosin (HYTRIN) 2 MG capsule Take 1 capsule (2 mg total) by mouth at bedtime.  Marland Kitchen UNABLE TO FIND CHEW 1 TO 2 TABLETS BY MOUTH 30 TO 45 MINUTES BEFORE SEXUAL INTERCOURSE  . valACYclovir (VALTREX) 1000 MG tablet Take 1 tablet (1,000 mg  total) by mouth 2 (two) times daily.     Allergies: Allergies  Allergen Reactions  . Penicillins Hives, Nausea Only and Rash    Has patient had a PCN reaction causing immediate rash, facial/tongue/throat swelling, SOB or lightheadedness with hypotension: Yes Has patient had a PCN reaction causing severe rash involving mucus membranes or skin necrosis: No Has patient had a PCN reaction that required hospitalization: No Has patient had a PCN reaction occurring within the last 10 years: No If all of the above answers are "NO", then may proceed with Cephalosporin use.   Marland Kitchen Zoloft [Sertraline Hcl] Other (See Comments)    Patient reported Psychomotor slowing / worsened depression  . Victoza [Liraglutide]     Did not tolerate this medication.  Complained of dizziness even at the lowest dose  . Chantix [Varenicline] Hives  . Lisinopril Cough  . [name] [Insulin Degludec] Rash    Social History: The patient  reports that he has been smoking. He has a 46.00 pack-year smoking history. He has never used smokeless tobacco. He reports previous alcohol use of about 1.0 standard drinks of alcohol per week. He reports that he does not use drugs.   Family History: The patient's family history includes Diabetes in his father and mother; Heart attack in his brother and father; Heart disease in his father; Osteoporosis in his mother; Stroke in his maternal  grandfather, maternal grandmother, paternal grandfather, and paternal grandmother.   Review of Systems: Please see the history of present illness.   All other systems are reviewed and negative.   Physical Exam: VS:  BP 120/70   Pulse 77   Ht 5\' 10"  (1.778 m)   Wt 195 lb (88.5 kg)   SpO2 98%   BMI 27.98 kg/m  .  BMI Body mass index is 27.98 kg/m.  Wt Readings from Last 3 Encounters:  07/03/19 195 lb (88.5 kg)  06/22/19 192 lb 12.8 oz (87.5 kg)  04/25/19 198 lb 12.8 oz (90.2 kg)    General: Pleasant. Well developed, well nourished and in no acute distress.   HEENT: Normal.  Neck: Supple, no JVD, carotid bruits, or masses noted.  Cardiac: Regular rate and rhythm. No murmurs, rubs, or gallops. No edema.  Respiratory:  Lungs are clear to auscultation bilaterally with normal work of breathing.  GI: Soft and nontender. Obese.  MS: No deformity or atrophy. Gait and ROM intact.  Skin: Warm and dry. Color is normal.  Neuro:  Strength and sensation are intact and no gross focal deficits noted.  Psych: Alert, appropriate and with normal affect.   LABORATORY DATA:  EKG:  EKG is ordered today. This demonstrates NSR with PACs, RBBB and lateral T wave changes - unchanged from prior tracing.  Lab Results  Component Value Date   WBC 4.8 10/25/2018   HGB 15.4 10/25/2018   HCT 44.0 10/25/2018   PLT 181 10/25/2018   GLUCOSE 175 (H) 04/25/2019   CHOL 145 10/25/2018   TRIG 72 10/25/2018   HDL 44 10/25/2018   LDLCALC 85 10/25/2018   ALT 21 10/25/2018   AST 21 10/25/2018   NA 137 04/25/2019   K 4.6 04/25/2019   CL 99 04/25/2019   CREATININE 0.93 04/25/2019   BUN 15 04/25/2019   CO2 22 04/25/2019   TSH 1.590 06/22/2019   PSA 1.09 05/07/2009   INR 0.9 07/10/2014   HGBA1C 7.6 (A) 03/13/2019   MICROALBUR 1.9 11/22/2014     BNP (last 3 results) No results for input(s):  BNP in the last 8760 hours.  ProBNP (last 3 results) No results for input(s): PROBNP in the last 8760 hours.    Other Studies Reviewed Today:  Echo Study Conclusions 02/2017  - Left ventricle: The cavity size was normal. Wall thickness was increased in a pattern of mild LVH. Systolic function was normal. The estimated ejection fraction was in the range of 60% to 65%. Wall motion was normal; there were no regional wall motion abnormalities. - Aortic valve: Trileaflet; mildly calcified leaflets. - Mitral valve: Calcified annulus. - Right ventricle: The cavity size was mildly dilated. Wall thickness was normal. Systolic function was normal.  Myoview Study Result 02/2017    There was no ST segment deviation noted during stress.  The study is normal.  This is a low risk study.  Nuclear stress EF: 53%.  Low risk stress nuclear study with normal perfusion and low normal left ventricular global systolic function.     ASSESSMENT & PLAN:    1.  CAD with prior LAD from 2015 - managed medically. No active symptoms noted. CV risk factor modification encouraged with smoking cessation. Would not favor using testosterone given known CAD history.   2. Ongoing tobacco abuse - trying to smoke less - total cessation encouraged.   3. DM - per PCP   4. HLD - on statin - not fasting today - seeing PCP next month with labs  5. HTN - BP is great on his current regimen. No changes made today.   6. COVID-19 Education: The signs and symptoms of COVID-19 were discussed with the patient and how to seek care for testing (follow up with PCP or arrange E-visit).  The importance of social distancing, staying at home, hand hygiene and wearing a mask when out in public were discussed today.  Current medicines are reviewed with the patient today.  The patient does not have concerns regarding medicines other than what has been noted above.  The following changes have been made:  See above.  Labs/ tests ordered today include:    Orders Placed This Encounter  Procedures  . EKG 12-Lead      Disposition:   FU with [place] in 6 months.  Patient is agreeable to this plan and will call if any problems develop in the interim.   SignedTruitt Merle, NP  07/03/2019 9:29 AM  [facility] [address] [place], [place]  [address] Phone: [phone] Fax: [phone]

## 2019-07-03 ENCOUNTER — Other Ambulatory Visit: Payer: Self-pay

## 2019-07-03 ENCOUNTER — Encounter: Payer: Self-pay | Admitting: Nurse Practitioner

## 2019-07-03 ENCOUNTER — Ambulatory Visit: Payer: Medicare HMO | Admitting: Nurse Practitioner

## 2019-07-03 VITALS — BP 120/70 | HR 77 | Ht 70.0 in | Wt 195.0 lb

## 2019-07-03 DIAGNOSIS — E7849 Other hyperlipidemia: Secondary | ICD-10-CM | POA: Diagnosis not present

## 2019-07-03 DIAGNOSIS — I10 Essential (primary) hypertension: Secondary | ICD-10-CM

## 2019-07-03 DIAGNOSIS — I2510 Atherosclerotic heart disease of native coronary artery without angina pectoris: Secondary | ICD-10-CM

## 2019-07-03 DIAGNOSIS — Z7189 Other specified counseling: Secondary | ICD-10-CM

## 2019-07-03 NOTE — Patient Instructions (Addendum)
After Visit Summary:  We will be checking the following labs today - NONE   Medication Instructions:    Continue with your current medicines.    If you need a refill on your cardiac medications before your next appointment, please call your pharmacy.     Testing/Procedures To Be Arranged:  N/A  Follow-Up:   See me in 6 months.     At [facility], you and your health needs are our priority.  As part of our continuing mission to provide you with exceptional heart care, we have created designated Provider Care Teams.  These Care Teams include your primary Cardiologist (physician) and Advanced Practice Providers (APPs -  Physician Assistants and Nurse Practitioners) who all work together to provide you with the care you need, when you need it.  Special Instructions:  . Stay safe, stay home, wash your hands for at least 20 seconds and wear a mask when out in public.  . It was good to talk with you today.  Marland Kitchen Keep working on your smoking.    Call the [place] office at [phone] if you have any questions, problems or concerns.

## 2019-07-05 ENCOUNTER — Other Ambulatory Visit: Payer: Self-pay | Admitting: Internal Medicine

## 2019-07-05 DIAGNOSIS — M25551 Pain in right hip: Secondary | ICD-10-CM

## 2019-07-18 ENCOUNTER — Encounter: Payer: Self-pay | Admitting: Infectious Diseases

## 2019-07-18 ENCOUNTER — Ambulatory Visit: Payer: Medicare HMO

## 2019-07-18 ENCOUNTER — Telehealth: Payer: Self-pay | Admitting: Pharmacy Technician

## 2019-07-18 ENCOUNTER — Other Ambulatory Visit: Payer: Self-pay

## 2019-07-18 NOTE — Telephone Encounter (Signed)
[facility] Patient Advocate Encounter   I was successful in securing patient a $7500 grant from [employer] (PAF) to provide copayment coverage for Biktarvy. This will make the out of pocket cost $0.     I have spoken with the patient and they will fill at [facility].    The billing information is as follows: RxBin: [id number] PCN: [id number] Member ID: [id number] Group ID: [id number] Dates of Eligibility: 07/18/2019 through 07/18/2020  Patient knows to call the office with questions or concerns.  Bartholomew Crews, CPhT Specialty Pharmacy Patient [facility] Phone: [phone] Fax: [phone] 07/18/2019 2:24 PM

## 2019-07-25 ENCOUNTER — Other Ambulatory Visit: Payer: Self-pay | Admitting: Internal Medicine

## 2019-07-25 DIAGNOSIS — F411 Generalized anxiety disorder: Secondary | ICD-10-CM

## 2019-07-25 DIAGNOSIS — Z79891 Long term (current) use of opiate analgesic: Secondary | ICD-10-CM

## 2019-07-25 MED ORDER — ALPRAZOLAM 0.5 MG PO TABS: 0.5000 mg | ORAL_TABLET | Freq: Every evening | ORAL | 0 refills | Status: DC | PRN

## 2019-07-25 MED ORDER — HYDROCODONE-ACETAMINOPHEN 7.5-325 MG PO TABS: 1.0000 | ORAL_TABLET | Freq: Four times a day (QID) | ORAL | 0 refills | Status: DC | PRN

## 2019-07-29 DIAGNOSIS — J018 Other acute sinusitis: Secondary | ICD-10-CM | POA: Diagnosis not present

## 2019-07-31 DIAGNOSIS — Z20828 Contact with and (suspected) exposure to other viral communicable diseases: Secondary | ICD-10-CM | POA: Diagnosis not present

## 2019-08-06 ENCOUNTER — Other Ambulatory Visit: Payer: Self-pay | Admitting: Infectious Diseases

## 2019-08-06 DIAGNOSIS — B20 Human immunodeficiency virus [HIV] disease: Secondary | ICD-10-CM

## 2019-08-08 ENCOUNTER — Ambulatory Visit (INDEPENDENT_AMBULATORY_CARE_PROVIDER_SITE_OTHER): Payer: Medicare HMO | Admitting: Internal Medicine

## 2019-08-08 ENCOUNTER — Encounter: Payer: Self-pay | Admitting: Internal Medicine

## 2019-08-08 ENCOUNTER — Other Ambulatory Visit: Payer: Self-pay

## 2019-08-08 VITALS — BP 149/82 | HR 58 | Temp 98.1°F | Ht 70.0 in | Wt 196.8 lb

## 2019-08-08 DIAGNOSIS — G8929 Other chronic pain: Secondary | ICD-10-CM

## 2019-08-08 DIAGNOSIS — N5201 Erectile dysfunction due to arterial insufficiency: Secondary | ICD-10-CM

## 2019-08-08 DIAGNOSIS — Z7902 Long term (current) use of antithrombotics/antiplatelets: Secondary | ICD-10-CM

## 2019-08-08 DIAGNOSIS — Z794 Long term (current) use of insulin: Secondary | ICD-10-CM

## 2019-08-08 DIAGNOSIS — F339 Major depressive disorder, recurrent, unspecified: Secondary | ICD-10-CM

## 2019-08-08 DIAGNOSIS — E11618 Type 2 diabetes mellitus with other diabetic arthropathy: Secondary | ICD-10-CM | POA: Diagnosis not present

## 2019-08-08 DIAGNOSIS — B20 Human immunodeficiency virus [HIV] disease: Secondary | ICD-10-CM | POA: Diagnosis not present

## 2019-08-08 DIAGNOSIS — F411 Generalized anxiety disorder: Secondary | ICD-10-CM

## 2019-08-08 DIAGNOSIS — E1165 Type 2 diabetes mellitus with hyperglycemia: Secondary | ICD-10-CM | POA: Diagnosis not present

## 2019-08-08 DIAGNOSIS — Z23 Encounter for immunization: Secondary | ICD-10-CM

## 2019-08-08 DIAGNOSIS — I10 Essential (primary) hypertension: Secondary | ICD-10-CM

## 2019-08-08 DIAGNOSIS — E118 Type 2 diabetes mellitus with unspecified complications: Secondary | ICD-10-CM

## 2019-08-08 DIAGNOSIS — Z79899 Other long term (current) drug therapy: Secondary | ICD-10-CM

## 2019-08-08 DIAGNOSIS — Z0000 Encounter for general adult medical examination without abnormal findings: Secondary | ICD-10-CM

## 2019-08-08 DIAGNOSIS — N529 Male erectile dysfunction, unspecified: Secondary | ICD-10-CM

## 2019-08-08 DIAGNOSIS — F329 Major depressive disorder, single episode, unspecified: Secondary | ICD-10-CM

## 2019-08-08 DIAGNOSIS — M069 Rheumatoid arthritis, unspecified: Secondary | ICD-10-CM | POA: Diagnosis not present

## 2019-08-08 DIAGNOSIS — F17200 Nicotine dependence, unspecified, uncomplicated: Secondary | ICD-10-CM

## 2019-08-08 DIAGNOSIS — Z79891 Long term (current) use of opiate analgesic: Secondary | ICD-10-CM

## 2019-08-08 DIAGNOSIS — I2510 Atherosclerotic heart disease of native coronary artery without angina pectoris: Secondary | ICD-10-CM

## 2019-08-08 DIAGNOSIS — Z7982 Long term (current) use of aspirin: Secondary | ICD-10-CM

## 2019-08-08 DIAGNOSIS — F32A Depression, unspecified: Secondary | ICD-10-CM

## 2019-08-08 DIAGNOSIS — F17210 Nicotine dependence, cigarettes, uncomplicated: Secondary | ICD-10-CM

## 2019-08-08 LAB — POCT GLYCOSYLATED HEMOGLOBIN (HGB A1C): Hemoglobin A1C: 10 % — AB (ref 4.0–5.6)

## 2019-08-08 LAB — GLUCOSE, CAPILLARY: Glucose-Capillary: 191 mg/dL — ABNORMAL HIGH (ref 70–99)

## 2019-08-08 MED ORDER — OZEMPIC (1 MG/DOSE) 2 MG/1.5ML ~~LOC~~ SOPN: 1.0000 mg | PEN_INJECTOR | SUBCUTANEOUS | 3 refills | Status: DC

## 2019-08-08 MED ORDER — TADALAFIL 5 MG PO TABS: 5.0000 mg | ORAL_TABLET | Freq: Every day | ORAL | 0 refills | Status: DC | PRN

## 2019-08-08 NOTE — Progress Notes (Signed)
   Subjective:    Patient ID: KAISEN HUESMAN, male    DOB: [birth year], 63 y.o.   MRN: [id number]  HPI  I have seen and examined this patient.  Patient is here for routine follow-up of his diabetes and hypertension.  Patient states that he feels well currently and is compliant with all his medications.  Patient states that he has been having issues with erectile dysfunction and is wondering if testosterone supplementation is appropriate for him.  He denies any other complaints at this time.  Review of Systems  Constitutional: Negative.   HENT: Negative.   Respiratory: Negative.   Cardiovascular: Negative.   Gastrointestinal: Negative.   Musculoskeletal: Negative.   Neurological: Negative.   Psychiatric/Behavioral: Negative.        Objective:   Physical Exam Constitutional:      Appearance: Normal appearance.  HENT:     Head: Normocephalic and atraumatic.  Neck:     Musculoskeletal: Neck supple.  Cardiovascular:     Rate and Rhythm: Normal rate and regular rhythm.     Heart sounds: Normal heart sounds.  Pulmonary:     Effort: Pulmonary effort is normal.     Breath sounds: Normal breath sounds. No wheezing or rales.  Abdominal:     General: Bowel sounds are normal.     Palpations: Abdomen is soft.  Musculoskeletal:        General: No swelling or tenderness.  Lymphadenopathy:     Cervical: No cervical adenopathy.  Skin:    General: Skin is warm and dry.  Neurological:     General: No focal deficit present.     Mental Status: He is alert and oriented to person, place, and time.  Psychiatric:        Mood and Affect: Mood normal.        Behavior: Behavior normal.           Assessment & Plan:

## 2019-08-08 NOTE — Assessment & Plan Note (Signed)
-  This problem is chronic and stable -Patient states that this problem is now slowly getting better -He would like to come off his Cymbalta as he believes that this may be contributing to his erectile dysfunction -Patient states that he uses Xanax only occasionally.  Will decrease his monthly Xanax to 15 tablets from 20 tablets.  Patient is in agreement. -The goal is to eventually taper off his Xanax completely -Patient is in agreement with plan.  We will continue to monitor closely

## 2019-08-08 NOTE — Assessment & Plan Note (Signed)
-  This problem is chronic and stable -Patient follows up with Dr. Johnnye Sima for this as an outpatient -We will continue Biktarvy for now per ID

## 2019-08-08 NOTE — Assessment & Plan Note (Signed)
Lab Results  Component Value Date   HGBA1C 10.0 (A) 08/08/2019   HGBA1C 7.6 (A) 03/13/2019   HGBA1C 8.5 (A) 10/25/2018     Assessment: Diabetes control:   Uncontrolled Progress toward A1C goal:   Deteriorated Comments: Patient states that he is compliant with Ozempic 0.5 mg weekly, insulin glargine 20 units nightly as well as Synjardy XR 01/999 mg twice daily.  Plan: Medications:  continue current medications, We will increase Ozempic to 1 mg weekly Home glucose monitoring: Frequency:   Timing:   Instruction/counseling given: discussed diet Educational resources provided:   Self management tools provided:   Other plans: Patient states that he has not been following a good diet and has been eating a lot of chocolate over Halloween.  We talked about improving his diet and following up in 3 months for repeat A1c.

## 2019-08-08 NOTE — Assessment & Plan Note (Signed)
-  This problem is chronic and stable -Patient's PHQ 9 score is currently 3 which is improved from 7 -Patient states that overall his mood has improved -He is currently on Cymbalta 60 mg.  Patient would like to come off this medication as he believes this is contributing to his erectile dysfunction -I sent a message to Dr. Maudie Mercury to see if we need to taper him off this medication and how to do this safely. -Patient will continue on his medication for now till we taper him off of this -No further work-up at this time -I suspect that he may need an additional medication if his depression worsens off of the Cymbalta possibly bupropion

## 2019-08-08 NOTE — Assessment & Plan Note (Signed)
-  This problem is chronic and stable -Patient states that he used to follow-up with rheumatology.  Cannot afford to do this anymore as the co-pay is too high -We will continue with pain control for now with Norco -Patient states he has not noticed any pain or swelling in his fingers or joints recently -No further work-up at this time.  We will continue with pain control for now.

## 2019-08-08 NOTE — Assessment & Plan Note (Signed)
-  This problem is chronic and stable -Patient follows up with cardiology as an outpatient for this -Patient denies any symptoms of chest pain or shortness of breath -We will continue with aspirin, Plavix, carvedilol and statin for now -Encourage complete smoking cessation.  Patient states that he is working on this and is attempting to quit on his own -No further work-up at this time

## 2019-08-08 NOTE — Assessment & Plan Note (Signed)
-  This problem is chronic and stable -Patient complains of difficulty maintaining erections -I explained the patient that he has multiple reasons for erectile dysfunction including longstanding diabetes which has been uncontrolled  -Patient's early morning testosterone level was within normal limits.  I explained to him that I do not believe the testosterone is an appropriate medication for him at this time especially given his history of CAD -Patient not currently on a nitrate and would like to try tadalafil.  Will prescribe 10 tablets of tadalafil for him. -We will continue to monitor closely

## 2019-08-08 NOTE — Patient Instructions (Addendum)
-  It was a pleasure seeing you today -Your diabetes is uncontrolled.  We will increase your Ozempic to 1 mg/week -Please continue to monitor blood sugars closely and watch her diet and exercise -We will also decrease your Xanax to 15 tablets a month -I will refill tadalafil for you as well -Please call me if you have any questions or concerns -I have reached out to Dr. Maudie Mercury for help with Cymbalta taper as well as Butch Penny for help in obtaining the freestyle [place]

## 2019-08-08 NOTE — Assessment & Plan Note (Addendum)
-  Patient received his flu shot as an outpatient -Patient also received his first shingles shot as an outpatient -He is due for his pneumonia 23 valent vaccine today.  This was given in the office -No further work-up at this time

## 2019-08-08 NOTE — Progress Notes (Signed)
1 

## 2019-08-08 NOTE — Assessment & Plan Note (Signed)
-  This problem is chronic and stable -Patient states that he went 5 days recently without smoking any cigarettes but has started smoking again.  He does have a quit date in mind. -His boyfriend does not smoke and he wants to stop smoking completely.  States that he wants to do this on his own. -No further work-up at this time.  We will follow-up with this at his next visit

## 2019-08-08 NOTE — Assessment & Plan Note (Signed)
BP Readings from Last 3 Encounters:  08/08/19 (!) 149/82  07/03/19 120/70  06/22/19 131/70    Lab Results  Component Value Date   NA 137 04/25/2019   K 4.6 04/25/2019   CREATININE 0.93 04/25/2019    Assessment: Blood pressure control:  Fair Progress toward BP goal:   Deteriorated Comments: Patient is compliant with losartan/HCTZ 50/12.5 mg, Terazosin 2 mg at night as well as carvedilol 3.125 mg twice daily  Plan: Medications:  continue current medications Educational resources provided:   Self management tools provided:   Other plans:

## 2019-08-08 NOTE — Assessment & Plan Note (Signed)
-  This problem is chronic and stable -Patient states that he is able to do his daily activities on his current pain regimen but has difficulty otherwise -Currently the benefits of continue this medication outweigh the risks -No further work-up at this time.  We will continue with Norco for now -We will check UDS at follow-up visit

## 2019-08-09 ENCOUNTER — Telehealth: Payer: Self-pay | Admitting: *Deleted

## 2019-08-09 ENCOUNTER — Encounter: Payer: Self-pay | Admitting: Internal Medicine

## 2019-08-09 ENCOUNTER — Other Ambulatory Visit: Payer: Medicare HMO

## 2019-08-09 DIAGNOSIS — B20 Human immunodeficiency virus [HIV] disease: Secondary | ICD-10-CM

## 2019-08-09 DIAGNOSIS — E1169 Type 2 diabetes mellitus with other specified complication: Secondary | ICD-10-CM

## 2019-08-09 NOTE — Telephone Encounter (Addendum)
Information was sent through CoverMyMeds for [place] for Ozempic/.  Awaiting determination within 3-7 days.  [employer]  [phone].Sander Nephew, RN 08/09/2019 4:35 PM. Call to [facility] after denial for the [place].  Call to [facility] in [place] change in amount dispense to 42ml per 28 days.  Approved without need for PA.   Patient will need a new refill prescription sent in January for 3 ml monthly.  Dr. Dareen Piano was notified of and patient was called and informed that his prescription is available for pick up.  Sander Nephew, RN 08/11/2019 10:19 AM.

## 2019-08-10 LAB — T-HELPER CELL (CD4) - (RCID CLINIC ONLY)
CD4 % Helper T Cell: 43 % (ref 33–65)
CD4 T Cell Abs: 615 /uL (ref 400–1790)

## 2019-08-12 LAB — COMPREHENSIVE METABOLIC PANEL
AG Ratio: 1.8 (calc) (ref 1.0–2.5)
ALT: 15 U/L (ref 9–46)
AST: 14 U/L (ref 10–35)
Albumin: 4.3 g/dL (ref 3.6–5.1)
Alkaline phosphatase (APISO): 80 U/L (ref 35–144)
BUN: 21 mg/dL (ref 7–25)
CO2: 29 mmol/L (ref 20–32)
Calcium: 10.4 mg/dL — ABNORMAL HIGH (ref 8.6–10.3)
Chloride: 99 mmol/L (ref 98–110)
Creat: 0.98 mg/dL (ref 0.70–1.25)
Globulin: 2.4 g/dL (calc) (ref 1.9–3.7)
Glucose, Bld: 194 mg/dL — ABNORMAL HIGH (ref 65–99)
Potassium: 4.2 mmol/L (ref 3.5–5.3)
Sodium: 136 mmol/L (ref 135–146)
Total Bilirubin: 0.6 mg/dL (ref 0.2–1.2)
Total Protein: 6.7 g/dL (ref 6.1–8.1)

## 2019-08-12 LAB — CBC
HCT: 47 % (ref 38.5–50.0)
Hemoglobin: 16.5 g/dL (ref 13.2–17.1)
MCH: 32.9 pg (ref 27.0–33.0)
MCHC: 35.1 g/dL (ref 32.0–36.0)
MCV: 93.6 fL (ref 80.0–100.0)
MPV: 10.2 fL (ref 7.5–12.5)
Platelets: 208 10*3/uL (ref 140–400)
RBC: 5.02 10*6/uL (ref 4.20–5.80)
RDW: 12.4 % (ref 11.0–15.0)
WBC: 8 10*3/uL (ref 3.8–10.8)

## 2019-08-12 LAB — LIPID PANEL
Cholesterol: 147 mg/dL (ref <200)
HDL: 41 mg/dL (ref >=40)
LDL Cholesterol (Calc): 78 mg/dL (calc)
Non-HDL Cholesterol (Calc): 106 mg/dL (calc) (ref <130)
Total CHOL/HDL Ratio: 3.6 (calc) (ref <5.0)
Triglycerides: 186 mg/dL — ABNORMAL HIGH (ref <150)

## 2019-08-12 LAB — HIV-1 RNA QUANT-NO REFLEX-BLD
HIV 1 RNA Quant: <20 copies/mL — AB
HIV-1 RNA Quant, Log: <1.3 Log copies/mL — AB

## 2019-08-17 ENCOUNTER — Other Ambulatory Visit: Payer: Self-pay | Admitting: Dietician

## 2019-08-17 NOTE — Telephone Encounter (Signed)
Sorry. It is there now.

## 2019-08-17 NOTE — Telephone Encounter (Signed)
Hello Butch Penny. I just checked my box and did not see the order.

## 2019-08-17 NOTE — Telephone Encounter (Signed)
Discussed with patient per Dr. Dareen Piano about Continuous glucose monitoring. I explained to Riley Jones that because of covid-19 Medicare has relaxed qualifications and if he qualifies now, he may not after the pandemic is over. He verbalized understanding. He agreed to have order sent in to see if he qualifies. The order is in Dr. Wilber Bihari box for signature.

## 2019-08-21 ENCOUNTER — Telehealth: Payer: Self-pay | Admitting: Pharmacist

## 2019-08-21 DIAGNOSIS — F329 Major depressive disorder, single episode, unspecified: Secondary | ICD-10-CM

## 2019-08-21 DIAGNOSIS — F32A Depression, unspecified: Secondary | ICD-10-CM

## 2019-08-22 DIAGNOSIS — Z20828 Contact with and (suspected) exposure to other viral communicable diseases: Secondary | ICD-10-CM | POA: Diagnosis not present

## 2019-08-24 ENCOUNTER — Other Ambulatory Visit: Payer: Self-pay | Admitting: Internal Medicine

## 2019-08-24 DIAGNOSIS — Z79891 Long term (current) use of opiate analgesic: Secondary | ICD-10-CM

## 2019-08-24 DIAGNOSIS — N5201 Erectile dysfunction due to arterial insufficiency: Secondary | ICD-10-CM

## 2019-08-24 DIAGNOSIS — F411 Generalized anxiety disorder: Secondary | ICD-10-CM

## 2019-08-25 ENCOUNTER — Encounter: Payer: Medicare HMO | Admitting: Infectious Diseases

## 2019-08-28 ENCOUNTER — Other Ambulatory Visit: Payer: Self-pay | Admitting: Internal Medicine

## 2019-08-28 DIAGNOSIS — Z79891 Long term (current) use of opiate analgesic: Secondary | ICD-10-CM

## 2019-08-28 DIAGNOSIS — F411 Generalized anxiety disorder: Secondary | ICD-10-CM

## 2019-08-28 MED ORDER — HYDROCODONE-ACETAMINOPHEN 7.5-325 MG PO TABS: 1.0000 | ORAL_TABLET | Freq: Four times a day (QID) | ORAL | 0 refills | Status: DC | PRN

## 2019-08-28 MED ORDER — ALPRAZOLAM 0.5 MG PO TABS: 0.5000 mg | ORAL_TABLET | Freq: Every evening | ORAL | 0 refills | Status: DC | PRN

## 2019-08-28 MED ORDER — TADALAFIL 5 MG PO TABS: 5.0000 mg | ORAL_TABLET | Freq: Every day | ORAL | 0 refills | Status: DC | PRN

## 2019-08-29 ENCOUNTER — Encounter: Payer: Self-pay | Admitting: Infectious Diseases

## 2019-08-29 ENCOUNTER — Other Ambulatory Visit: Payer: Self-pay

## 2019-08-29 ENCOUNTER — Ambulatory Visit (INDEPENDENT_AMBULATORY_CARE_PROVIDER_SITE_OTHER): Payer: Medicare HMO | Admitting: Infectious Diseases

## 2019-08-29 VITALS — BP 172/78 | HR 73 | Temp 97.8°F | Wt 195.0 lb

## 2019-08-29 DIAGNOSIS — E11618 Type 2 diabetes mellitus with other diabetic arthropathy: Secondary | ICD-10-CM

## 2019-08-29 DIAGNOSIS — J309 Allergic rhinitis, unspecified: Secondary | ICD-10-CM | POA: Diagnosis not present

## 2019-08-29 DIAGNOSIS — N5201 Erectile dysfunction due to arterial insufficiency: Secondary | ICD-10-CM | POA: Diagnosis not present

## 2019-08-29 DIAGNOSIS — B0223 Postherpetic polyneuropathy: Secondary | ICD-10-CM | POA: Diagnosis not present

## 2019-08-29 DIAGNOSIS — Z79899 Other long term (current) drug therapy: Secondary | ICD-10-CM

## 2019-08-29 DIAGNOSIS — E119 Type 2 diabetes mellitus without complications: Secondary | ICD-10-CM | POA: Diagnosis not present

## 2019-08-29 DIAGNOSIS — F17200 Nicotine dependence, unspecified, uncomplicated: Secondary | ICD-10-CM

## 2019-08-29 DIAGNOSIS — I10 Essential (primary) hypertension: Secondary | ICD-10-CM

## 2019-08-29 DIAGNOSIS — Z113 Encounter for screening for infections with a predominantly sexual mode of transmission: Secondary | ICD-10-CM | POA: Diagnosis not present

## 2019-08-29 DIAGNOSIS — B20 Human immunodeficiency virus [HIV] disease: Secondary | ICD-10-CM

## 2019-08-29 MED ORDER — VALACYCLOVIR HCL 1 G PO TABS: 1000.0000 mg | ORAL_TABLET | Freq: Two times a day (BID) | ORAL | 1 refills | Status: DC

## 2019-08-29 NOTE — Assessment & Plan Note (Signed)
Encouraged to quit. 

## 2019-08-29 NOTE — Assessment & Plan Note (Signed)
He is going to work on improve control, diet.  Appreciate Ms Donna's f/u.

## 2019-08-29 NOTE — Assessment & Plan Note (Signed)
Elevated today.  He will f/u with PCP.  He is asx.

## 2019-08-29 NOTE — Assessment & Plan Note (Signed)
Attributes his headaches to this and to his sinusitis.  Appreciate his PCP.

## 2019-08-29 NOTE — Progress Notes (Signed)
   Subjective:    Patient ID: Riley Jones, male    DOB: 03/06/1956, 63 y.o.   MRN: [id number]  HPI 63yo M with hx of HIV+ (~72yrs), DM2 (>80yrs, on insulin) and RA (took MTX without relief, off prednisone), COPD. He has been maintained on atripla (has been on multiple previous meds). At his visit 07-2017 he was changed to biktarvy.  Also hx of smoking. CAD s/p DES to mid LAD in 06/2014, his EF 55-65% at that time. He had a myoview 11-2014- low risk. Has been doing well, continues to work at [employer]. 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  He has itch, burning, on R flank, thinks like his shingles.  Due for 2nd shingles vax this month.   Has had sinus infection- was given doxy. Upset stomach, diarrhea. Felt horrible. Usually takes Amoxil.    Glc has been "high", seeing DM at [facility].  Dietary indiscretion.   Was given cialis at last visit, did not help.   HIV 1 RNA Quant (copies/mL)  Date Value  08/09/2019 <20 DETECTED (A)  10/25/2018 <20 DETECTED (A)  01/19/2018 39 (H)   CD4 T Cell Abs (/uL)  Date Value  08/09/2019 615  10/25/2018 760  01/19/2018 700    Review of Systems  Constitutional: Negative for appetite change, chills, fever and unexpected weight change.  HENT: Positive for postnasal drip.   Eyes: Negative for visual disturbance.  Respiratory: Negative for cough and shortness of breath.   Gastrointestinal: Negative for constipation and diarrhea.  Genitourinary: Positive for frequency. Negative for difficulty urinating.  Neurological: Positive for headaches.       Objective:   Physical Exam Constitutional:      Appearance: Normal appearance. He is obese.  HENT:     Mouth/Throat:     Mouth: Mucous membranes are moist.     Pharynx: No oropharyngeal exudate.  Eyes:     Extraocular Movements: Extraocular movements intact.     Pupils: Pupils are equal, round, and reactive to light.  Neck:     Musculoskeletal: Normal range of motion and neck supple.  Cardiovascular:     Rate and  Rhythm: Normal rate and regular rhythm.     Heart sounds: Normal heart sounds.  Pulmonary:     Effort: Pulmonary effort is normal.     Breath sounds: Normal breath sounds.  Abdominal:     General: Bowel sounds are normal. There is no distension.     Palpations: Abdomen is soft.     Tenderness: There is no abdominal tenderness.  Musculoskeletal:        General: No swelling.     Right lower leg: No edema.     Left lower leg: No edema.  Neurological:     General: No focal deficit present.     Mental Status: He is alert.  Psychiatric:        Mood and Affect: Mood normal.           Assessment & Plan:

## 2019-08-29 NOTE — Assessment & Plan Note (Signed)
Will f/u with PCP for higher dose

## 2019-08-29 NOTE — Assessment & Plan Note (Signed)
Has gotten flu shot.  Discussed his labs with him rtc in 9 months.  Offered/refused condoms.

## 2019-09-01 MED ORDER — DULOXETINE HCL 30 MG PO CPEP: 30.0000 mg | ORAL_CAPSULE | Freq: Every day | ORAL | 0 refills | Status: DC

## 2019-09-01 NOTE — Progress Notes (Signed)
Contacted patient to help with Cymbalta taper. Patient states he has reduced his dose from 60 mg daily to every other day during the past 2 weeks. He reports no signs or symptoms of concern at this time. Patient was advised to switch to 30 mg strength, then take 30 mg daily for 2 weeks, 30 mg every other day, then stop. Patient was also advised to contact clinic if any concerns arise and he verbalized understanding.

## 2019-09-04 NOTE — Telephone Encounter (Signed)
Thank you :)

## 2019-09-08 NOTE — Telephone Encounter (Signed)
Was informed that [facility] is out of network, but that [facility] is in network Will fax application to [place].

## 2019-09-13 NOTE — Telephone Encounter (Signed)
Order faxed to [facility]

## 2019-09-25 ENCOUNTER — Other Ambulatory Visit: Payer: Self-pay | Admitting: Student in an Organized Health Care Education/Training Program

## 2019-09-25 DIAGNOSIS — F411 Generalized anxiety disorder: Secondary | ICD-10-CM

## 2019-09-25 DIAGNOSIS — N5201 Erectile dysfunction due to arterial insufficiency: Secondary | ICD-10-CM

## 2019-09-25 DIAGNOSIS — Z79891 Long term (current) use of opiate analgesic: Secondary | ICD-10-CM

## 2019-09-25 MED ORDER — ALPRAZOLAM 0.5 MG PO TABS: 0.5000 mg | ORAL_TABLET | Freq: Every evening | ORAL | 0 refills | Status: DC | PRN

## 2019-09-25 MED ORDER — HYDROCODONE-ACETAMINOPHEN 7.5-325 MG PO TABS: 1.0000 | ORAL_TABLET | Freq: Four times a day (QID) | ORAL | 0 refills | Status: DC | PRN

## 2019-09-25 MED ORDER — TADALAFIL 5 MG PO TABS: 5.0000 mg | ORAL_TABLET | Freq: Every day | ORAL | 0 refills | Status: DC | PRN

## 2019-09-25 NOTE — Telephone Encounter (Signed)
Last rx written  Hydrocodone 08/28/19. Last OV  08/08/19. Next OV  11/28/19. UDS  04/25/19.

## 2019-10-03 ENCOUNTER — Telehealth: Payer: Self-pay | Admitting: Pharmacist

## 2019-10-03 ENCOUNTER — Other Ambulatory Visit: Payer: Self-pay

## 2019-10-03 ENCOUNTER — Ambulatory Visit: Payer: Medicare HMO

## 2019-10-03 NOTE — Progress Notes (Signed)
Contacted for patient for follow up duloxetine taper. Patient reports discontinuation of duloxetine a few weeks ago. He reports no signs or symptoms of concern other than becoming emotional during the holidays when watching movies.  Overall, he is currently in good spirits and states he is staying busy by helping extra at work (score 0 on PHQ-2). He hopes to work on smoking cessation sometime this year.  Will sign off of case and notify PCP. Duloxetine was removed from medication list. Patient was notified to follow up with PCP as advised and patient verbalized understanding.

## 2019-10-03 NOTE — Telephone Encounter (Signed)
Thank you for your help.

## 2019-10-14 ENCOUNTER — Other Ambulatory Visit: Payer: Self-pay | Admitting: Internal Medicine

## 2019-10-14 DIAGNOSIS — E7800 Pure hypercholesterolemia, unspecified: Secondary | ICD-10-CM

## 2019-10-18 NOTE — Telephone Encounter (Addendum)
received detailed written order and request for clinical chart notes from [facility]. Most recent chart note and order put in Dr. Wilber Bihari box for his signature.

## 2019-10-19 NOTE — Telephone Encounter (Signed)
Thank you :)

## 2019-10-27 ENCOUNTER — Other Ambulatory Visit: Payer: Self-pay | Admitting: Student in an Organized Health Care Education/Training Program

## 2019-10-27 DIAGNOSIS — Z79891 Long term (current) use of opiate analgesic: Secondary | ICD-10-CM

## 2019-10-27 DIAGNOSIS — F411 Generalized anxiety disorder: Secondary | ICD-10-CM

## 2019-10-28 ENCOUNTER — Other Ambulatory Visit: Payer: Self-pay | Admitting: Internal Medicine

## 2019-10-28 DIAGNOSIS — I10 Essential (primary) hypertension: Secondary | ICD-10-CM

## 2019-10-30 ENCOUNTER — Other Ambulatory Visit: Payer: Self-pay | Admitting: Internal Medicine

## 2019-10-30 DIAGNOSIS — Z79891 Long term (current) use of opiate analgesic: Secondary | ICD-10-CM

## 2019-10-30 DIAGNOSIS — F411 Generalized anxiety disorder: Secondary | ICD-10-CM

## 2019-10-30 MED ORDER — ALPRAZOLAM 0.5 MG PO TABS: 0.5000 mg | ORAL_TABLET | Freq: Every evening | ORAL | 0 refills | Status: DC | PRN

## 2019-10-30 MED ORDER — HYDROCODONE-ACETAMINOPHEN 7.5-325 MG PO TABS: 1.0000 | ORAL_TABLET | Freq: Four times a day (QID) | ORAL | 0 refills | Status: DC | PRN

## 2019-10-30 NOTE — Telephone Encounter (Signed)
Filled 2/1

## 2019-11-01 ENCOUNTER — Encounter: Payer: Self-pay | Admitting: Internal Medicine

## 2019-11-02 ENCOUNTER — Telehealth: Payer: Self-pay

## 2019-11-02 ENCOUNTER — Encounter: Payer: Self-pay | Admitting: Internal Medicine

## 2019-11-02 ENCOUNTER — Ambulatory Visit (INDEPENDENT_AMBULATORY_CARE_PROVIDER_SITE_OTHER): Payer: Medicare HMO | Admitting: Internal Medicine

## 2019-11-02 ENCOUNTER — Other Ambulatory Visit: Payer: Self-pay

## 2019-11-02 VITALS — BP 136/74 | HR 69 | Temp 98.4°F | Wt 195.0 lb

## 2019-11-02 DIAGNOSIS — R12 Heartburn: Secondary | ICD-10-CM | POA: Diagnosis not present

## 2019-11-02 DIAGNOSIS — L409 Psoriasis, unspecified: Secondary | ICD-10-CM | POA: Insufficient documentation

## 2019-11-02 DIAGNOSIS — R1032 Left lower quadrant pain: Secondary | ICD-10-CM | POA: Diagnosis not present

## 2019-11-02 DIAGNOSIS — B3789 Other sites of candidiasis: Secondary | ICD-10-CM

## 2019-11-02 HISTORY — DX: Left lower quadrant pain: R10.32

## 2019-11-02 MED ORDER — DICYCLOMINE HCL 10 MG PO CAPS: 10.0000 mg | ORAL_CAPSULE | Freq: Three times a day (TID) | ORAL | 0 refills | Status: DC

## 2019-11-02 MED ORDER — BETAMETHASONE DIPROPIONATE 0.05 % EX CREA: TOPICAL_CREAM | Freq: Two times a day (BID) | CUTANEOUS | 0 refills | Status: DC

## 2019-11-02 MED ORDER — NYSTATIN 100000 UNIT/GM EX POWD: 1.0000 "application " | Freq: Two times a day (BID) | CUTANEOUS | 1 refills | Status: DC

## 2019-11-02 NOTE — Patient Instructions (Addendum)
Riley Jones,   I sent a prescription for Bentyl to your pharmacy to help with the abdominal pain.  You can take this up to 3 times a day.   For your rash in your knee I sent a prescription for a steroid cream called betamethasone.  You can apply this twice a day.  Please are apply this in your face.  For the rash in your groin I sent the prescription for nystatin powder that you can apply 2-3 times a day as needed.  Please make sure to keep the area dry.   Call us if you have any questions or concerns.  - Dr. Frederico Hamman

## 2019-11-02 NOTE — Telephone Encounter (Signed)
To: [facility] TRIAGE NURSE POOL    From: HARVIS RATZLAFF    Created: 11/01/2019 8:31 PM     *-*-*This message has not been handled.*-*-*  Hello, I have been having bad pains on my left side in front on my belly, and having bad dizzy spells where I have to sit down so I don,t pass out, and I was wondering if I should make an appointment. Thanks, Riley Jones     RTC to patient: he states he was dizzy on yesterday, but feels fine now and is not experiencing any dizziness.  States he did check his blood sugar yesterday and it was 190.  Pt is still c/o intermittent left-sided abdominal pain, which feels like "pulling".  Pt states when he was seen at his endocrine MD, he was told he had a hernia and this was the first time he was told this.  States pain has been ongoing for 2 months.  Offered appt in [facility] today for evaluation, pt agreeable, appt made for 3:15 in [facility] today. SChaplin, RN,BSN

## 2019-11-02 NOTE — Telephone Encounter (Signed)
I agree with [facility] appt. Thank you

## 2019-11-03 DIAGNOSIS — B3789 Other sites of candidiasis: Secondary | ICD-10-CM | POA: Insufficient documentation

## 2019-11-03 HISTORY — DX: Other sites of candidiasis: B37.89

## 2019-11-03 NOTE — Progress Notes (Signed)
Internal Medicine Clinic Attending  Case discussed with Dr. Santos-Sanchez at the time of the visit.  We reviewed the resident's history and exam and pertinent patient test results.  I agree with the assessment, diagnosis, and plan of care documented in the resident's note.    

## 2019-11-03 NOTE — Assessment & Plan Note (Addendum)
Patient presents with a 1 year history of left-sided abdominal pain extending to his groin. He reports this is intermittent and episodes last approximately 1-2 minutes. They occur 2-4 times per week at times. Not associated with food intake. He states the pain is not severe and it has never interfered with his activities or job until yesterday when he experienced a sharp pain in his L abdomen, became dizzy, and almost passed out. This prompted him to make an appt today for further evaluation. He denies N/V and changes in BM. Last BM was this AM. No melena or hematochezia. Has not noticed any bloating or abdominal distention. He has a good appetite and has not had unintentional weight loss during this time. His abdominal exam is unremarkable.    Etiology unclear to me at this time. This pain is intermittent and is not associated with any other symptoms. He does have a hx of abd surgeries but low suspicion for SBO or ileus with nl BMs. Question if these could be spasms from oblique muscles? Low suspicion for infection given long term course of symptoms. History and symptoms not suggestive of GERD or gastritis. Will treat symptomatically with antispasmodic and continue to monitor symptoms. Will hold off on imaging for now, but can consider if he develops new symptoms or current symptoms worsen.

## 2019-11-03 NOTE — Assessment & Plan Note (Signed)
Nystatin powder ordered and patient advised to keep area dry.

## 2019-11-03 NOTE — Progress Notes (Signed)
   CC: L-sided abdominal pain, L knee rash, and groin rash  HPI:  Mr.Akhil ARRIAN KNOCH is a 64 y.o. year-old male with PMH listed below who presents to clinic for left-sided abdominal, left knee and groin rash. Please see problem based assessment and plan for further details.   Past Medical History:  Diagnosis Date  . Anxiety   . Arthritis    "pretty much all over"  . Asthma    "mild" (07/17/2014)  . Atypical angina ([place])    Myoview 2003, Normal EF 64%  . CAD (coronary artery disease)    a. 10/20 DES to 80% mid LAD  . COPD (chronic obstructive pulmonary disease) ([place])    "mild" (07/17/2014)  . Depression   . Foot pain 03/30/2011  . GERD (gastroesophageal reflux disease)   . H/O hiatal hernia   . Headache    "weekly" (06/2014)  . History of gout   . HIV (human immunodeficiency virus infection) ([place]) 1995   DX after shingles followed by Dr. Ola Spurr (ID)  . Hx of cardiovascular stress test    ETT-Myoview (3/16):  Ex 10:15, No Ischemia, EF 57%; Normal Study  . Hyperlipidemia   . Hypertension    Well Controlled off meds  . Kidney stones    "passed them all"  . Pneumonia 4-5 times  . Rheumatoid arthritis(714.0)    Followed by Dr. Charlestine Night, off MTX and prdnisone since 2007  . Sleep apnea    does not wear mask (07/17/2014)  . Type II diabetes mellitus ([place])    Review of Systems:   Review of Systems  Constitutional: Negative for chills, fever, malaise/fatigue and weight loss.  Gastrointestinal: Positive for heartburn. Negative for abdominal pain, blood in stool, constipation, diarrhea, melena, nausea and vomiting.  Genitourinary: Negative for frequency and urgency.  Skin: Positive for itching and rash.  Neurological: Negative for dizziness and headaches.    Physical Exam:  Vitals:   11/02/19 1507  BP: 136/74  Pulse: 69  Temp: 98.4 F (36.9 C)  TempSrc: Oral  SpO2: 98%  Weight: 195 lb (88.5 kg)    General: elderly male, well appearing, in NAD  Abd: soft, NTND,  bowel sounds are normoactive, no hernias noted, no splenomegaly, no rashes or lesions noted on overlying skin  GU: erythematous areas around skin folds with some excoriations  Derm: erythematous annular lesion in center of L knee with overlying silver scales econsistent with psoriasis (see picture on media tab)    Assessment & Plan:   See Encounters Tab for problem based charting.  Patient discussed with Dr. Lynnae January

## 2019-11-03 NOTE — Assessment & Plan Note (Signed)
Patient states he was bitten by an insect in 03/2019 and since then he has had a scaly rash in his left knee.  He states that the rash is nonpainful but it is itchy sometimes.  On exam he has pain an erythematous. annular scaly lesion consistent with psoriasis. Ordered medium potency steroid cream. Patient advised not to use this cream for face.

## 2019-11-04 ENCOUNTER — Other Ambulatory Visit: Payer: Self-pay | Admitting: Internal Medicine

## 2019-11-04 DIAGNOSIS — F32A Depression, unspecified: Secondary | ICD-10-CM

## 2019-11-04 DIAGNOSIS — F329 Major depressive disorder, single episode, unspecified: Secondary | ICD-10-CM

## 2019-11-11 ENCOUNTER — Other Ambulatory Visit: Payer: Self-pay | Admitting: Internal Medicine

## 2019-11-11 DIAGNOSIS — F32A Depression, unspecified: Secondary | ICD-10-CM

## 2019-11-11 DIAGNOSIS — F329 Major depressive disorder, single episode, unspecified: Secondary | ICD-10-CM

## 2019-11-11 DIAGNOSIS — J42 Unspecified chronic bronchitis: Secondary | ICD-10-CM

## 2019-11-24 ENCOUNTER — Other Ambulatory Visit: Payer: Self-pay | Admitting: Student in an Organized Health Care Education/Training Program

## 2019-11-24 ENCOUNTER — Other Ambulatory Visit: Payer: Self-pay | Admitting: Internal Medicine

## 2019-11-24 DIAGNOSIS — F411 Generalized anxiety disorder: Secondary | ICD-10-CM

## 2019-11-24 DIAGNOSIS — N5201 Erectile dysfunction due to arterial insufficiency: Secondary | ICD-10-CM

## 2019-11-24 DIAGNOSIS — Z79891 Long term (current) use of opiate analgesic: Secondary | ICD-10-CM

## 2019-11-24 MED ORDER — ALPRAZOLAM 0.5 MG PO TABS: 0.5000 mg | ORAL_TABLET | Freq: Every evening | ORAL | 0 refills | Status: DC | PRN

## 2019-11-24 MED ORDER — TADALAFIL 5 MG PO TABS: 5.0000 mg | ORAL_TABLET | Freq: Every day | ORAL | 0 refills | Status: DC | PRN

## 2019-11-24 MED ORDER — HYDROCODONE-ACETAMINOPHEN 7.5-325 MG PO TABS: 1.0000 | ORAL_TABLET | Freq: Four times a day (QID) | ORAL | 0 refills | Status: DC | PRN

## 2019-11-25 ENCOUNTER — Other Ambulatory Visit: Payer: Self-pay | Admitting: Internal Medicine

## 2019-11-25 DIAGNOSIS — F32A Depression, unspecified: Secondary | ICD-10-CM

## 2019-11-25 DIAGNOSIS — F329 Major depressive disorder, single episode, unspecified: Secondary | ICD-10-CM

## 2019-11-27 ENCOUNTER — Encounter: Payer: Self-pay | Admitting: Internal Medicine

## 2019-11-27 NOTE — Telephone Encounter (Signed)
Last rx written 11/24/19. Last OV 11/02/19. Next OV 11/28/19. UDS 04/25/19.

## 2019-11-28 ENCOUNTER — Ambulatory Visit (INDEPENDENT_AMBULATORY_CARE_PROVIDER_SITE_OTHER): Payer: Medicare HMO | Admitting: Internal Medicine

## 2019-11-28 ENCOUNTER — Encounter: Payer: Self-pay | Admitting: Internal Medicine

## 2019-11-28 VITALS — BP 144/74 | HR 58 | Temp 99.0°F | Ht 70.0 in | Wt 197.4 lb

## 2019-11-28 DIAGNOSIS — L409 Psoriasis, unspecified: Secondary | ICD-10-CM | POA: Diagnosis not present

## 2019-11-28 DIAGNOSIS — N433 Hydrocele, unspecified: Secondary | ICD-10-CM | POA: Diagnosis not present

## 2019-11-28 DIAGNOSIS — E7800 Pure hypercholesterolemia, unspecified: Secondary | ICD-10-CM

## 2019-11-28 DIAGNOSIS — M069 Rheumatoid arthritis, unspecified: Secondary | ICD-10-CM

## 2019-11-28 DIAGNOSIS — J449 Chronic obstructive pulmonary disease, unspecified: Secondary | ICD-10-CM

## 2019-11-28 DIAGNOSIS — N529 Male erectile dysfunction, unspecified: Secondary | ICD-10-CM

## 2019-11-28 DIAGNOSIS — I10 Essential (primary) hypertension: Secondary | ICD-10-CM | POA: Diagnosis not present

## 2019-11-28 DIAGNOSIS — N50812 Left testicular pain: Secondary | ICD-10-CM | POA: Diagnosis not present

## 2019-11-28 DIAGNOSIS — E11618 Type 2 diabetes mellitus with other diabetic arthropathy: Secondary | ICD-10-CM

## 2019-11-28 DIAGNOSIS — Z79891 Long term (current) use of opiate analgesic: Secondary | ICD-10-CM | POA: Diagnosis not present

## 2019-11-28 DIAGNOSIS — G8929 Other chronic pain: Secondary | ICD-10-CM | POA: Diagnosis not present

## 2019-11-28 DIAGNOSIS — F419 Anxiety disorder, unspecified: Secondary | ICD-10-CM

## 2019-11-28 DIAGNOSIS — E119 Type 2 diabetes mellitus without complications: Secondary | ICD-10-CM | POA: Diagnosis not present

## 2019-11-28 DIAGNOSIS — Z794 Long term (current) use of insulin: Secondary | ICD-10-CM

## 2019-11-28 DIAGNOSIS — R1032 Left lower quadrant pain: Secondary | ICD-10-CM

## 2019-11-28 DIAGNOSIS — I2510 Atherosclerotic heart disease of native coronary artery without angina pectoris: Secondary | ICD-10-CM

## 2019-11-28 DIAGNOSIS — B20 Human immunodeficiency virus [HIV] disease: Secondary | ICD-10-CM

## 2019-11-28 DIAGNOSIS — J42 Unspecified chronic bronchitis: Secondary | ICD-10-CM

## 2019-11-28 DIAGNOSIS — E1169 Type 2 diabetes mellitus with other specified complication: Secondary | ICD-10-CM

## 2019-11-28 DIAGNOSIS — N5201 Erectile dysfunction due to arterial insufficiency: Secondary | ICD-10-CM

## 2019-11-28 DIAGNOSIS — F329 Major depressive disorder, single episode, unspecified: Secondary | ICD-10-CM

## 2019-11-28 DIAGNOSIS — Z720 Tobacco use: Secondary | ICD-10-CM

## 2019-11-28 DIAGNOSIS — Z7902 Long term (current) use of antithrombotics/antiplatelets: Secondary | ICD-10-CM

## 2019-11-28 DIAGNOSIS — F17200 Nicotine dependence, unspecified, uncomplicated: Secondary | ICD-10-CM

## 2019-11-28 DIAGNOSIS — Z79899 Other long term (current) drug therapy: Secondary | ICD-10-CM

## 2019-11-28 DIAGNOSIS — F32A Depression, unspecified: Secondary | ICD-10-CM

## 2019-11-28 DIAGNOSIS — Z7982 Long term (current) use of aspirin: Secondary | ICD-10-CM

## 2019-11-28 LAB — POCT GLYCOSYLATED HEMOGLOBIN (HGB A1C): Hemoglobin A1C: 8.8 % — AB (ref 4.0–5.6)

## 2019-11-28 LAB — GLUCOSE, CAPILLARY: Glucose-Capillary: 226 mg/dL — ABNORMAL HIGH (ref 70–99)

## 2019-11-28 MED ORDER — SYNJARDY XR 5-1000 MG PO TB24: 1.0000 | ORAL_TABLET | Freq: Two times a day (BID) | ORAL | 11 refills | Status: DC

## 2019-11-28 NOTE — Assessment & Plan Note (Signed)
-  This problem is chronic and stable -Patient is able to do daily activities on his current pain regimen but would have difficulty doing it otherwise -Currently the patient's benefits outweighs risks -No further work-up at this time.  We will continue with Norco for now. -We will recheck UDS today

## 2019-11-28 NOTE — Assessment & Plan Note (Signed)
-  This problem is chronic and stable -Patient states that he was using albuterol inhaler only rarely -He still smoking and we talked about smoking cessation today -We will continue with albuterol as needed for now -No further work-up at this time

## 2019-11-28 NOTE — Assessment & Plan Note (Signed)
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

## 2019-11-28 NOTE — Assessment & Plan Note (Signed)
-  This problem is chronic and stable -Patient is using Cialis as needed for this -I cautioned him against using nitroglycerin with Cialis and again expresses understanding and will avoid this -No further work-up at this time

## 2019-11-28 NOTE — Assessment & Plan Note (Signed)
-  This problem is chronic and stable -Patient states that his anxiety has improved and that he is not really using his Xanax -He is also off his Cymbalta currently -We will stop his Xanax for now as he has not used them in many days -No further work-up at this time

## 2019-11-28 NOTE — Assessment & Plan Note (Signed)
-  Patient states that he is having intermittent left lower quadrant abdominal pain which occurs release daily but comes and goes as well as associated left groin pain. -Patient was evaluated for this last month and was started on antispasmodics for this.  Patient states that this is not helped his pain and so he stopped taking his medication. -On exam, patient has no tenderness in his left lower quadrant but is tender to palpation in his left groin and left testicle.  There is no swelling or erythema noted in the testicle. -We will obtain scrotal ultrasound for further evaluation -Patient has had no fevers or other signs of infection. -We will continue continue to follow closely

## 2019-11-28 NOTE — Assessment & Plan Note (Signed)
BP Readings from Last 3 Encounters:  11/28/19 (!) 144/74  11/02/19 136/74  08/29/19 (!) 172/78    Lab Results  Component Value Date   NA 136 08/09/2019   K 4.2 08/09/2019   CREATININE 0.98 08/09/2019    Assessment: Blood pressure control:  Fair Progress toward BP goal:   Deteriorated Comments: Patient is compliant with losartan/HCTZ 50/12.5 mg daily, carvedilol 3.125 mg twice daily as well as trazodone 2 mg daily  Plan: Medications:  continue current medications Educational resources provided:   Self management tools provided:   Other plans: Patient with mildly elevated blood pressure today but may be secondary to left groin pain.  We will hold off on adjusting his blood pressure medications at this time.

## 2019-11-28 NOTE — Assessment & Plan Note (Signed)
-  This problem is chronic and stable -Patient's PHQ 9 is 4 which is stable from his last PHQ-9 -He states that his mood has improved and he is doing well off of Cymbalta. -No further work-up at this time -We will continue to monitor closely

## 2019-11-28 NOTE — Assessment & Plan Note (Signed)
-  This problem is chronic and stable -Patient used to follow-up with rheumatology but cannot afford to do this anymore as the co-pay is too high -We will continue with pain control with Norco for now -Patient denies any swelling in his fingers or joints currently but still has some chronic pain which is improved with his pain medication -No further work-up at this time.  We will continue with pain control for now.

## 2019-11-28 NOTE — Progress Notes (Signed)
   Subjective:    Patient ID: Riley Jones, male    DOB: [birth year], 64 y.o.   MRN: [id number]  HPI  This patient.  Patient is here for routine follow-up of his hypertension and diabetes.  Patient states that he still has some persistent left groin and left lower quadrant pain.  States that the pain comes and goes but occurs at least daily.  He denies any other complaints at this time and states that he is compliant with his medications.  Review of Systems  Constitutional: Negative.   HENT: Negative.   Respiratory: Negative.   Cardiovascular: Negative.   Gastrointestinal: Positive for abdominal pain. Negative for nausea and vomiting.       Patient with left lower quadrant abdominal pain radiating to his left groin  Musculoskeletal: Negative.   Skin: Negative.   Neurological: Negative.   Psychiatric/Behavioral: Negative.        Objective:   Physical Exam Vitals reviewed.  Constitutional:      Appearance: Normal appearance.  HENT:     Head: Normocephalic and atraumatic.  Cardiovascular:     Rate and Rhythm: Normal rate and regular rhythm.     Heart sounds: Normal heart sounds.  Pulmonary:     Breath sounds: Normal breath sounds. No wheezing or rales.  Abdominal:     General: Bowel sounds are normal. There is no distension.     Palpations: Abdomen is soft.     Tenderness: There is no abdominal tenderness.  Genitourinary:    Comments: Patient with tenderness to palpation in his left groin and left testicle but no visible swelling or erythema noted Musculoskeletal:        General: No swelling or tenderness.     Cervical back: Neck supple.  Lymphadenopathy:     Cervical: No cervical adenopathy.  Skin:    General: Skin is warm and dry.  Neurological:     General: No focal deficit present.     Mental Status: He is alert and oriented to person, place, and time.  Psychiatric:        Mood and Affect: Mood normal.        Behavior: Behavior normal.            Assessment & Plan:

## 2019-11-28 NOTE — Assessment & Plan Note (Signed)
Lab Results  Component Value Date   HGBA1C 8.8 (A) 11/28/2019   HGBA1C 10.0 (A) 08/08/2019   HGBA1C 7.6 (A) 03/13/2019     Assessment: Diabetes control:  Fair Progress toward A1C goal:   Improved Comments: Patient is supposed to be on empagliflozin/Metformin 01/999 mg twice daily, insulin glargine 20 units daily as well as Ozempic 1 mg weekly.  Patient has had some issues affording his medications and is applying for medication assistance for Synjardy as well as insulin glargine.  He is currently out of his Synjardy and I have sent a refill for this today  Plan: Medications:  continue current medications Home glucose monitoring: Frequency:   Timing:   Instruction/counseling given: discussed the need for weight loss Educational resources provided:   Self management tools provided:   Other plans: I have filled in the medication assistance form for Synjardy and I will fill out his medication system form for Basaglar today.  Patient A1c has improved to 8.8 today from 10 previously.  Patient does have a blood glucose average of 193 at home and we will need to continue to improve his diet and exercise.  We will refer him to [facility] for weight loss as well as diabetes counseling

## 2019-11-28 NOTE — Assessment & Plan Note (Signed)
-  This problem is chronic and stable -Patient follows up with Dr. Johnnye Sima at [facility] for this -On his last blood work his viral load was undetectable and he had a normal CD4 count -Continue with Biktarvy per ID

## 2019-11-28 NOTE — Patient Instructions (Addendum)
-  It was a pleasure seeing you again today -I have put in for an ultrasound of your groin given your persistent pain.  We will follow-up on the results -I put in a refill of Synjardy for you -Please continue to try to stop smoking -I will put in referral to [facility] to help you with weight loss as well as diabetes -Please continue to take your medications as prescribed and call me if you need any refills or if you have any issues obtaining medications -Please call me with any questions or concerns

## 2019-11-28 NOTE — Assessment & Plan Note (Signed)
-  Patient was noted to have a scaly rash over his left knee on his last visit which was diagnosed as psoriasis -Patient was prescribed a medium potency steroid cream which has helped improve the rash -We will continue with this for now -On exam, the rash appears to be getting smaller and healing -No further work-up at this time

## 2019-11-28 NOTE — Assessment & Plan Note (Signed)
-  This problem is chronic and stable -We will continue with Lipitor 40 mg daily for now -We will recheck lipid panel at follow-up visit -No further work-up at this time

## 2019-11-28 NOTE — Assessment & Plan Note (Addendum)
-  This problem is chronic and stable -Patient follows up with cardiology as an outpatient for this -He currently denies any chest pain or shortness of breath and states he has not had to use his nitroglycerin at all -We will continue with aspirin, Plavix, carvedilol and statin for now -We talked about smoking cessation today and patient states that he is still smoking but he wants to quit and that he will work with his company's health hub to try to quit smoking -Patient also warned to not use nitroglycerin when he is taking his Cialis and vice versa.  Patient expresses understanding of this and states that he will not use them together and knows not to take his nitroglycerin if he has taken the Cialis

## 2019-12-02 LAB — TOXASSURE SELECT,+ANTIDEPR,UR

## 2019-12-05 ENCOUNTER — Other Ambulatory Visit: Payer: Self-pay | Admitting: Pharmacist

## 2019-12-05 NOTE — Patient Outreach (Signed)
[facility]) Care Management  [place]   12/05/2019  RUTH ENGELHARD 05/25/1956 [id number]  Reason for referral: Medication Assistance   [facility] pharmacy assisted patient with applying for patient assistance programs for Ozempic made by [employer] and [place] made by [name] in 2020.    Successful telephone call with patient.  HIPAA identifiers verified.  Patient reports he printed out applications for patient assistance programs and delivered these to his PCP office last week to have them faxed into companies.  He denies having any questions or medication concerns.  Call placed to PCP office, spoke with RN Bonnita Nasuti.  She has patient's applications for [employer] but not [employer].  She has a few questions on filling out forms but will be able to fax in applications for [employer] today.    Call placed back to patient to inquire about Ozempic via [employer].  Left VM stating [facility] is happy to assist if needed with Ozempic or he can use PCP office as he did with other applications.    Ralene Bathe, PharmD, [place] [phone]

## 2019-12-06 DIAGNOSIS — E1165 Type 2 diabetes mellitus with hyperglycemia: Secondary | ICD-10-CM | POA: Diagnosis not present

## 2019-12-09 ENCOUNTER — Other Ambulatory Visit: Payer: Self-pay | Admitting: Infectious Diseases

## 2019-12-09 DIAGNOSIS — B20 Human immunodeficiency virus [HIV] disease: Secondary | ICD-10-CM

## 2019-12-20 ENCOUNTER — Encounter: Payer: Self-pay | Admitting: *Deleted

## 2019-12-25 ENCOUNTER — Other Ambulatory Visit: Payer: Self-pay | Admitting: Internal Medicine

## 2019-12-25 DIAGNOSIS — Z79891 Long term (current) use of opiate analgesic: Secondary | ICD-10-CM

## 2019-12-25 DIAGNOSIS — N5201 Erectile dysfunction due to arterial insufficiency: Secondary | ICD-10-CM

## 2019-12-25 MED ORDER — TADALAFIL 5 MG PO TABS: 5.0000 mg | ORAL_TABLET | Freq: Every day | ORAL | 0 refills | Status: DC | PRN

## 2019-12-25 MED ORDER — HYDROCODONE-ACETAMINOPHEN 7.5-325 MG PO TABS: 1.0000 | ORAL_TABLET | Freq: Four times a day (QID) | ORAL | 0 refills | Status: DC | PRN

## 2019-12-25 NOTE — Telephone Encounter (Signed)
June appt PCP pain FU Reviewed chart and database

## 2020-01-09 ENCOUNTER — Other Ambulatory Visit: Payer: Self-pay | Admitting: Internal Medicine

## 2020-01-09 NOTE — Progress Notes (Signed)
CARDIOLOGY OFFICE NOTE  Date:  01/15/2020    Riley Jones Date of Birth: Jan 04, 1956 Medical Record [id number]  PCP:  Aldine Contes, MD  Cardiologist:  Gillian Shields   Chief Complaint  Patient presents with  . Follow-up    History of Present Illness: Riley Jones is a 64 y.o. male who presents today for a 6 month check. Seen for Dr. Johnsie Cancel.   He has a history of knownCAD with LAD stent in 2015, ongoing tobacco use, DM-2, HIV, RA, HLD, HTN and chronic pain syndrome.   He washospitalized in 2018 forchest pain.During that time he had mildly elevated troponin at 0.03>0.04. His EKG showed RBBB with non-specific T wave abnormalities. Chest CT on 12/09/2016 demonstrated aortic atherosclerosis of the left main, LAD, LCX and RCA. There was also calcifications of the aortic and mitral annulus. Echocardiogram performed 02/28/2017 with LVEF of 60-65% with NWMA.He underwent aLexiscan myoview performed 03/10/2017 with LVEF estimated at 53%, considered a normal, low risk study with no signs of ischemia or infarct.He has been managed medically since.  He last saw Dr. Johnsie Cancel in 11/2016. He has followed with APP's since - saw Kathyrn Drown in October of 2019. Was doing ok - trying to cut back on tobacco use - was transitioning over to insulin pump - no chest pain noted.I did a telehealth visit with him back in April - lots of stress with his job at [employer] and family issues - was suppose to move to [place] to settle his mom's estate but this had been put on hold due to the pandemic. Was trying to smoke less. Did not transition to insulin pump. Overall he felt like he was doing ok. I saw him in the office back in October - never made it to [place] with the pandemic. Wanting to stop medicines.    The patient does not have symptoms concerning for COVID-19 infection (fever, chills, cough, or new shortness of breath).   Comes in today. Here alone. Feeling ok. Here for "check up". No real  complaints noted. He has had both vaccines. Continues to smoke. No chest pain. Feels like his breathing is ok. Tolerating his medicines. He is fasting today. He is happy with how he is doing. Father alive at 44 up in [place]. He is now doing the [place] for checking his sugars - this seems to be helping. A1C is coming down.   Past Medical History:  Diagnosis Date  . Anxiety   . Arthritis    "pretty much all over"  . Asthma    "mild" (07/17/2014)  . Atypical angina ([place])    Myoview 2003, Normal EF 64%  . CAD (coronary artery disease)    a. 10/20 DES to 80% mid LAD  . COPD (chronic obstructive pulmonary disease) ([place])    "mild" (07/17/2014)  . Depression   . Foot pain 03/30/2011  . GERD (gastroesophageal reflux disease)   . H/O hiatal hernia   . Headache    "weekly" (06/2014)  . History of gout   . HIV (human immunodeficiency virus infection) ([place]) 1995   DX after shingles followed by Dr. Ola Spurr (ID)  . Hx of cardiovascular stress test    ETT-Myoview (3/16):  Ex 10:15, No Ischemia, EF 57%; Normal Study  . Hyperlipidemia   . Hypertension    Well Controlled off meds  . Kidney stones    "passed them all"  . Pneumonia 4-5 times  . Rheumatoid arthritis(714.0)    Followed  by Dr. Charlestine Night, off MTX and prdnisone since 2007  . 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 Sleep apnea    does not wear mask (07/17/2014)  . Type II diabetes mellitus ([place])     Past Surgical History:  Procedure Laterality Date  . APPENDECTOMY  ~ 1981  . CARPAL TUNNEL RELEASE Left 1990's  . CORONARY ANGIOPLASTY WITH STENT PLACEMENT  07/17/2014   a. DES to 80% mid LAD  . HERNIA REPAIR    . LAPAROSCOPIC INCISIONAL / UMBILICAL / VENTRAL HERNIA REPAIR  1990's   "it was a double; not inguinal"  . LEFT HEART CATHETERIZATION WITH CORONARY ANGIOGRAM N/A 07/17/2014   Procedure: LEFT HEART CATHETERIZATION WITH CORONARY ANGIOGRAM;  Surgeon: [name], MD;  Location: [facility] C[facility] LAB;  Service:  Cardiovascular;  Laterality: N/A;  . PERCUTANEOUS STENT INTERVENTION  07/17/2014   Procedure: PERCUTANEOUS STENT INTERVENTION;  Surgeon: [name], MD;  Location: [facility] CATH LAB;  Service: Cardiovascular;;  . TONSILLECTOMY AND ADENOIDECTOMY  1990's  . TRIGGER FINGER RELEASE Left 1990's   2nd & 5th digits  . UMBILICAL HERNIA REPAIR  1980's   w/mesh  . UVULOPALATOPHARYNGOPLASTY (UPPP)/TONSILLECTOMY/SEPTOPLASTY  1990's     Medications: Current Meds  Medication Sig  . albuterol (VENTOLIN HFA) 108 (90 Base) MCG/ACT inhaler INHALE 1 PUFF BY MOUTH EVERY 4 HOURS AS NEEDED FOR SHORTNESS OF BREATH OR MAY USE EVERY 6 HOURS AS NEEDED  . aspirin 81 MG EC tablet Take 81 mg by mouth daily.    Marland Kitchen atorvastatin (LIPITOR) 40 MG tablet Take 1 tablet by mouth once daily  . BD PEN NEEDLE NANO 2ND GEN 32G X 4 MM MISC USE UP TO 8 PER WEEK  . betamethasone dipropionate 0.05 % cream Apply topically 2 (two) times daily.  Marland Kitchen BIKTARVY 50-200-25 MG TABS tablet Take 1 tablet by mouth once daily  . carvedilol (COREG) 3.125 MG tablet TAKE 1 TABLET BY MOUTH TWICE DAILY WITH A MEAL  . clopidogrel (PLAVIX) 75 MG tablet Take 1 tablet (75 mg total) by mouth daily.  . Empagliflozin-metFORMIN HCl ER (SYNJARDY XR) 01-999 MG TB24 Take 1 tablet by mouth 2 (two) times daily with a meal.  . fluticasone (FLONASE) 50 MCG/ACT nasal spray Place 1 spray into both nostrils daily.  Marland Kitchen glucose blood (ACCU-CHEK SMARTVIEW) test strip CHECK BLOOD SUGAR 4 TIMES A DAY. diag code E11.69. Insulin dependent  . HYDROcodone-acetaminophen (NORCO) 7.5-325 MG tablet Take 1 tablet by mouth every 6 (six) hours as needed for moderate pain.  . Insulin Glargine (BASAGLAR KWIKPEN) 100 UNIT/ML SOPN Inject 0.2 mLs (20 Units total) into the skin daily.  Marland Kitchen loratadine (CLARITIN) 10 MG tablet Take 1 tablet (10 mg total) by mouth daily.  Marland Kitchen losartan-hydrochlorothiazide (HYZAAR) 50-12.5 MG tablet Take 1 tablet by mouth once daily  . meloxicam (MOBIC) 7.5 MG tablet  Take 1 tablet (7.5 mg total) by mouth daily as needed for pain.  . nitroGLYCERIN (NITROSTAT) 0.4 MG SL tablet PLACE 1 TABLET UNDER THE TONGUE EVERY 5 MINUTES AS NEEDED FOR CHEST PAIN  . nystatin (NYSTATIN) powder Apply 1 application topically 2 (two) times daily.  Marland Kitchen omeprazole (PRILOSEC) 20 MG capsule TAKE 1 CAPSULE BY MOUTH AS NEEDED  . Semaglutide, 1 MG/DOSE, (OZEMPIC, 1 MG/DOSE,) 2 MG/1.5ML SOPN Inject 1 mg into the skin once a week.  . tadalafil (CIALIS) 5 MG tablet Take 1 tablet (5 mg total) by mouth daily as needed for erectile dysfunction. Use as directed  . terazosin (HYTRIN) 2 MG capsule  Take 1 capsule by mouth at bedtime  . [DISCONTINUED] UNABLE TO FIND CHEW 1 TO 2 TABLETS BY MOUTH 30 TO 45 MINUTES BEFORE SEXUAL INTERCOURSE  . [DISCONTINUED] valACYclovir (VALTREX) 1000 MG tablet Take 1 tablet (1,000 mg total) by mouth 2 (two) times daily.     Allergies: Allergies  Allergen Reactions  . Penicillins Hives, Nausea Only and Rash    Has patient had a PCN reaction causing immediate rash, facial/tongue/throat swelling, SOB or lightheadedness with hypotension: Yes Has patient had a PCN reaction causing severe rash involving mucus membranes or skin necrosis: No Has patient had a PCN reaction that required hospitalization: No Has patient had a PCN reaction occurring within the last 10 years: No If all of the above answers are "NO", then may proceed with Cephalosporin use.   Marland Kitchen Zoloft [Sertraline Hcl] Other (See Comments)    Patient reported Psychomotor slowing / worsened depression  . Victoza [Liraglutide]     Did not tolerate this medication.  Complained of dizziness even at the lowest dose  . Chantix [Varenicline] Hives  . Lisinopril Cough  . [name] [Insulin Degludec] Rash    Social History: The patient  reports that he has been smoking. He has a 46.00 pack-year smoking history. He has never used smokeless tobacco. He reports previous alcohol use of about 1.0 standard drinks of  alcohol per week. He reports that he does not use drugs.   Family History: The patient's family history includes Diabetes in his father and mother; Heart attack in his brother and father; Heart disease in his father; Osteoporosis in his mother; Stroke in his maternal grandfather, maternal grandmother, paternal grandfather, and paternal grandmother.   Review of Systems: Please see the history of present illness.   All other systems are reviewed and negative.   Physical Exam: VS:  BP 120/80   Pulse (!) 58   Ht 5\' 10"  (1.778 m)   Wt 196 lb (88.9 kg)   SpO2 98%   BMI 28.12 kg/m  .  BMI Body mass index is 28.12 kg/m.  Wt Readings from Last 3 Encounters:  01/15/20 196 lb (88.9 kg)  11/28/19 197 lb 6.4 oz (89.5 kg)  11/02/19 195 lb (88.5 kg)    General: Pleasant. Alert and in no acute distress.   Cardiac: Regular rate and rhythm. No murmurs, rubs, or gallops. No edema.  Respiratory:  Lungs are clear to auscultation bilaterally with normal work of breathing.  GI: Soft and nontender.  MS: No deformity or atrophy. Gait and ROM intact.  Skin: Warm and dry. Color is normal.  Neuro:  Strength and sensation are intact and no gross focal deficits noted.  Psych: Alert, appropriate and with normal affect.   LABORATORY DATA:  EKG:  EKG is not ordered today.    Lab Results  Component Value Date   WBC 8.0 08/09/2019   HGB 16.5 08/09/2019   HCT 47.0 08/09/2019   PLT 208 08/09/2019   GLUCOSE 194 (H) 08/09/2019   CHOL 147 08/09/2019   TRIG 186 (H) 08/09/2019   HDL 41 08/09/2019   LDLCALC 78 08/09/2019   ALT 15 08/09/2019   AST 14 08/09/2019   NA 136 08/09/2019   K 4.2 08/09/2019   CL 99 08/09/2019   CREATININE 0.98 08/09/2019   BUN 21 08/09/2019   CO2 29 08/09/2019   TSH 1.590 06/22/2019   PSA 1.09 05/07/2009   INR 0.9 07/10/2014   HGBA1C 8.8 (A) 11/28/2019   MICROALBUR 1.9 11/22/2014  BNP (last 3 results) No results for input(s): BNP in the last 8760 hours.  ProBNP  (last 3 results) No results for input(s): PROBNP in the last 8760 hours.   Other Studies Reviewed Today:  EchoStudy Conclusions6/2018  - Left ventricle: The cavity size was normal. Wall thickness was increased in a pattern of mild LVH. Systolic function was normal. The estimated ejection fraction was in the range of 60% to 65%. Wall motion was normal; there were no regional wall motion abnormalities. - Aortic valve: Trileaflet; mildly calcified leaflets. - Mitral valve: Calcified annulus. - Right ventricle: The cavity size was mildly dilated. Wall thickness was normal. Systolic function was normal.  MyoviewStudy Result6/2018    There was no ST segment deviation noted during stress.  The study is normal.  This is a low risk study.  Nuclear stress EF: 53%.  Low risk stress nuclear study with normal perfusion and low normal left ventricular global systolic function.     ASSESSMENT &PLAN:    1. CAD with prior LAD PCI from 2015 - with known residual disease - otherwise managed medically. He has no worrisome symptoms. Needs aggressive CV risk factor modification. Continue aspirin/statin/beta blocker and DAPT.   2. Tobacco abuse - total cessation encouraged.   3. DM - per PCP  4. HLD - on statin therapy - lab today.   5. HTN - BP is fine on current regimen - no changes made today.   6. COVID-19 Education: The signs and symptoms of COVID-19 were discussed with the patient and how to seek care for testing (follow up with PCP or arrange E-visit).  The importance of social distancing, staying at home, hand hygiene and wearing a mask when out in public were discussed today. He has had both vaccines.   Current medicines are reviewed with the patient today.  The patient does not have concerns regarding medicines other than what has been noted above.  The following changes have been made:  See above.  Labs/ tests ordered today include:    Orders Placed  This Encounter  Procedures  . Basic metabolic panel  . CBC  . Hepatic function panel  . Lipid panel     Disposition:   FU with me in 6 months. Lab today.    Patient is agreeable to this plan and will call if any problems develop in the interim.   SignedTruitt Merle, NP  01/15/2020 9:06 AM  [facility] [address] [place], [place]  [address] Phone: [phone] Fax: [phone]

## 2020-01-13 ENCOUNTER — Other Ambulatory Visit: Payer: Self-pay | Admitting: Nurse Practitioner

## 2020-01-13 ENCOUNTER — Other Ambulatory Visit: Payer: Self-pay | Admitting: Internal Medicine

## 2020-01-13 DIAGNOSIS — I10 Essential (primary) hypertension: Secondary | ICD-10-CM

## 2020-01-13 DIAGNOSIS — E7800 Pure hypercholesterolemia, unspecified: Secondary | ICD-10-CM

## 2020-01-15 ENCOUNTER — Ambulatory Visit: Payer: Medicare HMO | Admitting: Nurse Practitioner

## 2020-01-15 ENCOUNTER — Encounter: Payer: Self-pay | Admitting: Nurse Practitioner

## 2020-01-15 ENCOUNTER — Encounter: Payer: Medicare HMO | Admitting: Dietician

## 2020-01-15 ENCOUNTER — Other Ambulatory Visit: Payer: Self-pay

## 2020-01-15 VITALS — BP 120/80 | HR 58 | Ht 70.0 in | Wt 196.0 lb

## 2020-01-15 DIAGNOSIS — E7849 Other hyperlipidemia: Secondary | ICD-10-CM | POA: Diagnosis not present

## 2020-01-15 DIAGNOSIS — Z7189 Other specified counseling: Secondary | ICD-10-CM

## 2020-01-15 DIAGNOSIS — I10 Essential (primary) hypertension: Secondary | ICD-10-CM | POA: Diagnosis not present

## 2020-01-15 DIAGNOSIS — I2510 Atherosclerotic heart disease of native coronary artery without angina pectoris: Secondary | ICD-10-CM

## 2020-01-15 NOTE — Patient Instructions (Addendum)
After Visit Summary:  We will be checking the following labs today - BMET, CBC, HPF and lipids   Medication Instructions:    Continue with your current medicines.    If you need a refill on your cardiac medications before your next appointment, please call your pharmacy.     Testing/Procedures To Be Arranged:  N/A  Follow-Up:   See me in about 6 months.     At [facility], you and your health needs are our priority.  As part of our continuing mission to provide you with exceptional heart care, we have created designated Provider Care Teams.  These Care Teams include your primary Cardiologist (physician) and Advanced Practice Providers (APPs -  Physician Assistants and Nurse Practitioners) who all work together to provide you with the care you need, when you need it.  Special Instructions:  . Stay safe, stay home, wash your hands for at least 20 seconds and wear a mask when out in public.  . It was good to talk with you today.    Call the [place] office at [phone] if you have any questions, problems or concerns.

## 2020-01-16 LAB — HEPATIC FUNCTION PANEL
ALT: 26 IU/L (ref 0–44)
AST: 20 IU/L (ref 0–40)
Albumin: 4.4 g/dL (ref 3.8–4.8)
Alkaline Phosphatase: 90 IU/L (ref 39–117)
Bilirubin Total: 0.5 mg/dL (ref 0.0–1.2)
Bilirubin, Direct: 0.18 mg/dL (ref 0.00–0.40)
Total Protein: 6.6 g/dL (ref 6.0–8.5)

## 2020-01-16 LAB — CBC
Hematocrit: 44.9 % (ref 37.5–51.0)
Hemoglobin: 15.6 g/dL (ref 13.0–17.7)
MCH: 32.3 pg (ref 26.6–33.0)
MCHC: 34.7 g/dL (ref 31.5–35.7)
MCV: 93 fL (ref 79–97)
RBC: 4.83 x10E6/uL (ref 4.14–5.80)
RDW: 12.2 % (ref 11.6–15.4)
WBC: 6.8 x10E3/uL (ref 3.4–10.8)

## 2020-01-16 LAB — BASIC METABOLIC PANEL
BUN/Creatinine Ratio: 18 (ref 10–24)
BUN: 18 mg/dL (ref 8–27)
CO2: 22 mmol/L (ref 20–29)
Calcium: 9.9 mg/dL (ref 8.6–10.2)
Chloride: 101 mmol/L (ref 96–106)
Creatinine, Ser: 0.98 mg/dL (ref 0.76–1.27)
GFR calc Af Amer: 93 mL/min/{1.73_m2} (ref >59)
GFR calc non Af Amer: 81 mL/min/{1.73_m2} (ref >59)
Glucose: 275 mg/dL — ABNORMAL HIGH (ref 65–99)
Potassium: 4.6 mmol/L (ref 3.5–5.2)
Sodium: 138 mmol/L (ref 134–144)

## 2020-01-16 LAB — LIPID PANEL
Chol/HDL Ratio: 3.7 ratio (ref 0.0–5.0)
Cholesterol, Total: 145 mg/dL (ref 100–199)
HDL: 39 mg/dL — ABNORMAL LOW (ref >39)
LDL Chol Calc (NIH): 65 mg/dL (ref 0–99)
Triglycerides: 256 mg/dL — ABNORMAL HIGH (ref 0–149)
VLDL Cholesterol Cal: 41 mg/dL — ABNORMAL HIGH (ref 5–40)

## 2020-01-18 ENCOUNTER — Encounter: Payer: Medicare HMO | Admitting: Dietician

## 2020-01-26 ENCOUNTER — Other Ambulatory Visit: Payer: Self-pay | Admitting: Internal Medicine

## 2020-01-26 DIAGNOSIS — Z79891 Long term (current) use of opiate analgesic: Secondary | ICD-10-CM

## 2020-01-26 NOTE — Telephone Encounter (Signed)
Last rx written  12/25/19. Last OV  11/28/19. Next OV 02/20/20. UDS 11/28/19.

## 2020-01-29 ENCOUNTER — Other Ambulatory Visit: Payer: Self-pay | Admitting: Internal Medicine

## 2020-01-29 DIAGNOSIS — R1032 Left lower quadrant pain: Secondary | ICD-10-CM

## 2020-02-09 ENCOUNTER — Telehealth: Payer: Self-pay | Admitting: *Deleted

## 2020-02-09 NOTE — Telephone Encounter (Signed)
I agree. Thank you.

## 2020-02-09 NOTE — Telephone Encounter (Signed)
I agree

## 2020-02-09 NOTE — Telephone Encounter (Signed)
Call from pt - c/o back pain radiating to abd area; stated this has been going on x 2 weeks but getting worse; sometimes cutting off his breath. Took pain pill this am but moving around at work, pain return. No appts today; schedule an appt on Monday 5/17 in [facility] - instructed pt if condition worsens, to go to [facility] - stated he will.

## 2020-02-10 ENCOUNTER — Other Ambulatory Visit: Payer: Self-pay | Admitting: Internal Medicine

## 2020-02-10 DIAGNOSIS — I10 Essential (primary) hypertension: Secondary | ICD-10-CM

## 2020-02-12 ENCOUNTER — Encounter: Payer: Self-pay | Admitting: Internal Medicine

## 2020-02-12 ENCOUNTER — Ambulatory Visit (INDEPENDENT_AMBULATORY_CARE_PROVIDER_SITE_OTHER): Payer: Medicare HMO | Admitting: Internal Medicine

## 2020-02-12 ENCOUNTER — Other Ambulatory Visit: Payer: Self-pay

## 2020-02-12 VITALS — BP 149/85 | HR 63 | Temp 97.5°F | Ht 70.0 in | Wt 198.4 lb

## 2020-02-12 DIAGNOSIS — I10 Essential (primary) hypertension: Secondary | ICD-10-CM | POA: Diagnosis not present

## 2020-02-12 DIAGNOSIS — R1032 Left lower quadrant pain: Secondary | ICD-10-CM

## 2020-02-12 NOTE — Patient Instructions (Addendum)
Riley Jones,   It was a pleasure seeing you in clinic. Today we discussed:  Left sided pain: I am getting some tests to see if this could be from a kidney stone. I will also order an ultrasound of your kidneys tomorrow. Please continue to take your hydrocodone as needed for pain. If your symptoms worsen at all, please let us know or seek emergent medical attention.    Please contact us if you have any questions or concerns.  Thank you!

## 2020-02-12 NOTE — Assessment & Plan Note (Addendum)
BP slightly elevated today at 149/85. Patient notes compliance with medications. Suspect current elevation to be secondary to acute pain.    Plan: Continue Losartan HCTZ 50-12.5mg  daily, Carvedilol 3.125mg  bid  BP check in 3 months

## 2020-02-12 NOTE — Assessment & Plan Note (Signed)
Patient endorses ongoing left-sided pain since February for which he was initially prescribed antispasmodics without significant relief.  Over the past 2 weeks, he notes left flank pain radiating to left groin.  He notes this is intermittent in nature; however, increased in frequency lately.  He does have a history of renal stones and notes that he has previously experienced significant pain with his renal stones.  On examination, he has mild tenderness to palpation on the left flank and left lower quadrant of abdomen; however, he does endorse taking hydrocodone prior to office visit.   Plan -BMP and UA -Renal ultrasound in addition to scrotal ultrasound tomorrow -Continue pain control with Norco -Close follow-up

## 2020-02-12 NOTE — Progress Notes (Signed)
CC: left flank pain   HPI:  [name] is a 64 y.o. male with PMHx as listed below presenting with two weeks of worsening left flank pain radiating to left groin. No fevers or chills. Please see problem based charting for further assessment and plan.   Past Medical History:  Diagnosis Date  . Anxiety   . Arthritis    "pretty much all over"  . Asthma    "mild" (07/17/2014)  . Atypical angina ([place])    Myoview 2003, Normal EF 64%  . CAD (coronary artery disease)    a. 10/20 DES to 80% mid LAD  . COPD (chronic obstructive pulmonary disease) ([place])    "mild" (07/17/2014)  . Depression   . Foot pain 03/30/2011  . GERD (gastroesophageal reflux disease)   . H/O hiatal hernia   . Headache    "weekly" (06/2014)  . History of gout   . HIV (human immunodeficiency virus infection) ([place]) 1995   DX after shingles followed by Dr. Ola Spurr (ID)  . Hx of cardiovascular stress test    ETT-Myoview (3/16):  Ex 10:15, No Ischemia, EF 57%; Normal Study  . Hyperlipidemia   . Hypertension    Well Controlled off meds  . Kidney stones    "passed them all"  . Pneumonia 4-5 times  . Rheumatoid arthritis(714.0)    Followed by Dr. Charlestine Night, off MTX and prdnisone since 2007  . 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 Sleep apnea    does not wear mask (07/17/2014)  . Type II diabetes mellitus ([place])    Review of Systems:  Negative except as stated in HPI.  Physical Exam:  Vitals:   02/12/20 0950  BP: (!) 149/85  Pulse: 63  Temp: (!) 97.5 F (36.4 C)  TempSrc: Oral  SpO2: 98%  Weight: 198 lb 6.4 oz (90 kg)  Height: 5\' 10"  (1.778 m)   Physical Exam Constitutional:      General: He is not in acute distress.    Appearance: He is not ill-appearing.  HENT:     Mouth/Throat:     Mouth: Mucous membranes are moist.     Pharynx: Oropharynx is clear.  Eyes:     General: No scleral icterus.    Extraocular Movements: Extraocular movements intact.     Conjunctiva/sclera:  Conjunctivae normal.  Cardiovascular:     Rate and Rhythm: Normal rate and regular rhythm.     Pulses: Normal pulses.     Heart sounds: Normal heart sounds.  Pulmonary:     Effort: Pulmonary effort is normal. No respiratory distress.     Breath sounds: Normal breath sounds.  Abdominal:     General: Bowel sounds are normal.     Palpations: Abdomen is soft. There is no mass.     Tenderness: There is no abdominal tenderness. There is left CVA tenderness. There is no rebound.     Hernia: No hernia is present.     Comments: Mild tenderness to palpation of left flank radiating to left lower quadrant  Musculoskeletal:        General: Normal range of motion.  Skin:    General: Skin is warm and dry.     Capillary Refill: Capillary refill takes less than 2 seconds.  Neurological:     General: No focal deficit present.     Mental Status: He is alert and oriented to person, place, and time. Mental status is at baseline.     Sensory: No sensory deficit.  Motor: No weakness.     Gait: Gait normal.      Assessment & Plan:   See Encounters Tab for problem based charting.  Patient discussed with Dr. Rebeca Alert

## 2020-02-13 ENCOUNTER — Other Ambulatory Visit: Payer: Self-pay

## 2020-02-13 ENCOUNTER — Ambulatory Visit (HOSPITAL_COMMUNITY)
Admission: RE | Admit: 2020-02-13 | Discharge: 2020-02-13 | Disposition: A | Discharge status: Home / Self Care | Payer: Medicare HMO | Source: Ambulatory Visit | Attending: Internal Medicine | Admitting: Internal Medicine

## 2020-02-13 DIAGNOSIS — R1032 Left lower quadrant pain: Secondary | ICD-10-CM | POA: Diagnosis not present

## 2020-02-13 DIAGNOSIS — R109 Unspecified abdominal pain: Secondary | ICD-10-CM | POA: Diagnosis not present

## 2020-02-13 DIAGNOSIS — N433 Hydrocele, unspecified: Secondary | ICD-10-CM | POA: Diagnosis not present

## 2020-02-13 LAB — BMP8+ANION GAP
Anion Gap: 16 mmol/L (ref 10.0–18.0)
BUN/Creatinine Ratio: 17 (ref 10–24)
BUN: 17 mg/dL (ref 8–27)
CO2: 24 mmol/L (ref 20–29)
Calcium: 9.9 mg/dL (ref 8.6–10.2)
Chloride: 101 mmol/L (ref 96–106)
Creatinine, Ser: 0.98 mg/dL (ref 0.76–1.27)
GFR calc Af Amer: 93 mL/min/{1.73_m2} (ref >59)
GFR calc non Af Amer: 81 mL/min/{1.73_m2} (ref >59)
Glucose: 221 mg/dL — ABNORMAL HIGH (ref 65–99)
Potassium: 4.2 mmol/L (ref 3.5–5.2)
Sodium: 141 mmol/L (ref 134–144)

## 2020-02-13 LAB — URINALYSIS, ROUTINE W REFLEX MICROSCOPIC
Bilirubin, UA: NEGATIVE
Ketones, UA: NEGATIVE
Leukocytes,UA: NEGATIVE
Nitrite, UA: NEGATIVE
Protein,UA: NEGATIVE
RBC, UA: NEGATIVE
Specific Gravity, UA: >=1.03 — AB (ref 1.005–1.030)
Urobilinogen, Ur: 1 mg/dL (ref 0.2–1.0)
pH, UA: 6.5 (ref 5.0–7.5)

## 2020-02-20 ENCOUNTER — Ambulatory Visit (INDEPENDENT_AMBULATORY_CARE_PROVIDER_SITE_OTHER): Payer: Medicare HMO | Admitting: Internal Medicine

## 2020-02-20 ENCOUNTER — Ambulatory Visit (INDEPENDENT_AMBULATORY_CARE_PROVIDER_SITE_OTHER): Payer: Medicare HMO | Admitting: Dietician

## 2020-02-20 ENCOUNTER — Encounter: Payer: Self-pay | Admitting: Internal Medicine

## 2020-02-20 ENCOUNTER — Encounter: Payer: Self-pay | Admitting: Dietician

## 2020-02-20 VITALS — BP 148/72 | HR 64 | Temp 97.8°F | Ht 70.0 in | Wt 196.2 lb

## 2020-02-20 DIAGNOSIS — M069 Rheumatoid arthritis, unspecified: Secondary | ICD-10-CM | POA: Diagnosis not present

## 2020-02-20 DIAGNOSIS — B20 Human immunodeficiency virus [HIV] disease: Secondary | ICD-10-CM

## 2020-02-20 DIAGNOSIS — Z7984 Long term (current) use of oral hypoglycemic drugs: Secondary | ICD-10-CM

## 2020-02-20 DIAGNOSIS — F419 Anxiety disorder, unspecified: Secondary | ICD-10-CM | POA: Diagnosis not present

## 2020-02-20 DIAGNOSIS — F17200 Nicotine dependence, unspecified, uncomplicated: Secondary | ICD-10-CM

## 2020-02-20 DIAGNOSIS — L409 Psoriasis, unspecified: Secondary | ICD-10-CM

## 2020-02-20 DIAGNOSIS — E11618 Type 2 diabetes mellitus with other diabetic arthropathy: Secondary | ICD-10-CM

## 2020-02-20 DIAGNOSIS — I10 Essential (primary) hypertension: Secondary | ICD-10-CM

## 2020-02-20 DIAGNOSIS — Z79891 Long term (current) use of opiate analgesic: Secondary | ICD-10-CM | POA: Diagnosis not present

## 2020-02-20 DIAGNOSIS — F329 Major depressive disorder, single episode, unspecified: Secondary | ICD-10-CM | POA: Diagnosis not present

## 2020-02-20 DIAGNOSIS — J42 Unspecified chronic bronchitis: Secondary | ICD-10-CM

## 2020-02-20 DIAGNOSIS — J449 Chronic obstructive pulmonary disease, unspecified: Secondary | ICD-10-CM

## 2020-02-20 DIAGNOSIS — R1032 Left lower quadrant pain: Secondary | ICD-10-CM

## 2020-02-20 DIAGNOSIS — F32A Depression, unspecified: Secondary | ICD-10-CM

## 2020-02-20 DIAGNOSIS — Z794 Long term (current) use of insulin: Secondary | ICD-10-CM

## 2020-02-20 MED ORDER — CYCLOBENZAPRINE HCL 5 MG PO TABS: 5.0000 mg | ORAL_TABLET | Freq: Three times a day (TID) | ORAL | 0 refills | Status: DC | PRN

## 2020-02-20 NOTE — Assessment & Plan Note (Signed)
-  This problem is chronic and stable -We will recheck his A1c at his follow-up visit -Patient's blood sugars are slowly improving and his average blood sugar over the last 2 weeks was 190 -We talked importance of following a diet and exercising -We will continue with empagliflozin/Metformin 01/999 mg twice daily, insulin Basaglar 20 units daily as well as Ozempic 1 mg weekly -Patient to meet with Butch Penny (diabetes educator) today to talk about his diet -No further work-up at this time

## 2020-02-20 NOTE — Assessment & Plan Note (Signed)
-  This problem is chronic and stable -Patient follows with Dr. Johnnye Sima for this -On his last blood work his viral load was undetectable and he had normal CD4 count -Continue Biktarvy per ID

## 2020-02-20 NOTE — Assessment & Plan Note (Signed)
-  This problem is chronic and stable -We will recheck patient PHQ-9 score today -Patient used to be on Cymbalta for this but is now off this medication and appears to be doing well -No further work-up at this time

## 2020-02-20 NOTE — Assessment & Plan Note (Signed)
-  This problem is chronic and stable -Patient states that his anxiety has improved but he still uses Xanax occasionally at night to help him sleep -We have not been represcribing this for him.  Would consider starting the patient on trazodone for sleep once he runs out of his current Xanax prescription -No further work-up at this time

## 2020-02-20 NOTE — Progress Notes (Signed)
Diabetes Self-Management Education  Visit Type: Follow-up  Appt. Start Time: 1045 Appt. End Time: 1130  02/20/2020  Riley Jones, identified by name and date of birth, is a 64 y.o. male with a diagnosis of Diabetes:  Marland Kitchen Type 2  ASSESSMENT  He seems to be content with his current weight and A1C that has been decreasing since he started using his personal CGM .   Self Monitoring: patient using personal CGM effectively CGM Results from Personal CGM Download: Time active - 91% Average is  191  for 14 days Glucose management indicator 7.9 % Time in range (70-180 mg/dL): 49 % (Goal >70%) Time High (181-250 mg/dL) 41 % (Goal < 25%) Time Very High (>250 mg/dl) 10 % (Goal < 5%) Time Low (54-69 mg/dL): 0 % (Goal is <4%) Time Very Low (<54) 0%  (Goal <1%)  Coefficient of variation:21.8% (Goal is <36%)  Lab Results  Component Value Date   HGBA1C 8.8 (A) 11/28/2019   HGBA1C 10.0 (A) 08/08/2019   HGBA1C 7.6 (A) 03/13/2019   HGBA1C 8.5 (A) 10/25/2018   HGBA1C 8.1 (A) 07/25/2018    Lipid Panel     Component Value Date/Time   CHOL 145 01/15/2020 0918   TRIG 256 (H) 01/15/2020 0918   HDL 39 (L) 01/15/2020 0918   CHOLHDL 3.7 01/15/2020 0918   CHOLHDL 3.6 08/09/2019 0857   VLDL UNABLE TO CALCULATE IF TRIGLYCERIDE OVER 400 mg/dL 02/26/2017 2018   LDLCALC 65 01/15/2020 0918   LDLCALC 78 08/09/2019 0857   LABVLDL 41 (H) 01/15/2020 0918    Wt Readings from Last 10 Encounters:  02/20/20 196 lb 3.2 oz (89 kg)  02/12/20 198 lb 6.4 oz (90 kg)  01/15/20 196 lb (88.9 kg)  11/28/19 197 lb 6.4 oz (89.5 kg)  11/02/19 195 lb (88.5 kg)  08/29/19 195 lb (88.5 kg)  08/08/19 196 lb 12.8 oz (89.3 kg)  07/03/19 195 lb (88.5 kg)  06/22/19 192 lb 12.8 oz (87.5 kg)  04/25/19 198 lb 12.8 oz (90.2 kg)   Estimated body mass index is 28.15 kg/m as calculated from the following:   Height as of an earlier encounter on 02/20/20: 5\' 10"  (1.778 m).   Weight as of an earlier encounter on 02/20/20: 196 lb  3.2 oz (89 kg).    Diabetes Self-Management Education - 02/20/20 1100      Visit Information   Visit Type  Follow-up      Psychosocial Assessment   Patient Belief/Attitude about Diabetes  Motivated to manage diabetes    Self-care barriers  Lack of material resources    Self-management support  Doctor's office;Friends;CDE visits    Patient Concerns  Healthy Lifestyle;Support    Special Needs  None    Preferred Learning Style  No preference indicated    Learning Readiness  Change in progress   making changes himself;doesn't indiate need for intervention     Pre-Education Assessment   Patient understands the diabetes disease and treatment process.  Demonstrates understanding / competency    Patient understands incorporating nutritional management into lifestyle.  Demonstrates understanding / competency    Patient undertands incorporating physical activity into lifestyle.  Demonstrates understanding / competency    Patient understands using medications safely.  Demonstrates understanding / competency    Patient understands monitoring blood glucose, interpreting and using results  Needs Review    Patient understands prevention, detection, and treatment of chronic complications.  Demonstrates understanding / competency    Patient understands how to develop strategies to  promote health/change behavior.  Demonstrates understanding / competency      Complications   Last HgB A1C per patient/outside source  8.8 %    How often do you check your blood sugar?  > 4 times/day   cgm shows 7 or more times a day   Fasting Blood glucose range (mg/dL)  130-179;180-200    Postprandial Blood glucose range (mg/dL)  180-200;>200    Number of hypoglycemic episodes per month  0    Number of hyperglycemic episodes per week  14    Have you had a dilated eye exam in the past 12 months?  No   intends to schedule   Have you had a dental exam in the past 12 months?  --   waiting for insurance to start      Dietary Intake   Breakfast  Boost, oatmeal or a poptart and a pot of coffee    Lunch  tuna salad or salads    Dinner  beans, chicken , fish, trying to eat more vegetables    Snack (evening)  bowl of cereal    Beverage(s)  3 pots of coffee- splends vs 1 tsp sugar and a little cream in each cup, 2% milk ( 3 gallon s per week) sweet tea and water      Exercise   Exercise Type  ADL's;Light (walking / raking leaves);Moderate (swimming / aerobic walking)    How many days per week to you exercise?  5    How many minutes per day do you exercise?  30    Total minutes per week of exercise  150      Patient Education   Previous Diabetes Education  Yes (please comment)   here   Monitoring  Other (comment)   educted about remote uploading of personal CGM     Individualized Goals (developed by patient)   Reducing Risk  Other (comment)   to get annual eye exam     Outcomes   Expected Outcomes  Demonstrated interest in learning. Expect positive outcomes   pt works on behavior change at his own pace   Future DMSE  Yearly    Program Status  Not Completed   needs goal follow up , DSMS, outcomes measured      Individualized Plan for Diabetes Self-Management Training:   Learning Objective:  Patient will have a greater understanding of diabetes self-management. Patient education plan is to attend individual and/or group sessions per assessed needs and concerns.   Plan:   There are no Patient Instructions on file for this visit.  Expected Outcomes:  Demonstrated interest in learning. Expect positive outcomes(pt works on behavior change at his own pace)  Air traffic controller provided: Diabetes Resources  If problems or questions, patient to contact team via:  Phone or email  Future DSME appointment: Yearly  Call for goal follow up and diabetes self management support plan Debera Lat, RD 02/20/2020 1:54 PM.

## 2020-02-20 NOTE — Patient Instructions (Signed)
-  Was a pleasure seeing you today -I have prescribed Flexeril for your left lower quadrant abdominal pain.  Please use this as needed up to 3 times a day for the pain.  If there is no improvement in the pain at this there is no need to take this medication -I have put in a CT of your abdomen and pelvis for you for further evaluation of this pain -For your worsening joint pain I have referred you to rheumatology -Please continue to try and quit smoking and try the nicotine patch again -Your blood sugars are slowly improving.  Please continue to watch the diet and keep up the good work. -Please follow-up with Butch Penny

## 2020-02-20 NOTE — Assessment & Plan Note (Signed)
BP Readings from Last 3 Encounters:  02/20/20 (!) 148/72  02/12/20 (!) 149/85  01/15/20 120/80    Lab Results  Component Value Date   NA 141 02/12/2020   K 4.2 02/12/2020   CREATININE 0.98 02/12/2020    Assessment: Blood pressure control:  Fair Progress toward BP goal:   Unchanged Comments: Patient is compliant with losartan/HCTZ 50/12.5 mg daily, carvedilol 3.125 mg twice daily as well as terazosin 2 mg daily  Plan: Medications:  continue current medications Educational resources provided:   Self management tools provided:   Other plans: If patient blood pressure remains elevated at follow-up will increase his losartan to 100 mg

## 2020-02-20 NOTE — Assessment & Plan Note (Signed)
-  Patient still continues to smoke -We talked of importance of quitting smoking especially given his history of CAD, diabetes and hypertension and COPD -Patient states that he will try the nicotine patch again -We will follow-up with him at his next visit

## 2020-02-20 NOTE — Progress Notes (Signed)
   Subjective:    Patient ID: Riley Jones, male    DOB: 22-Apr-1956, 64 y.o.   MRN: [id number]  HPI  I have seen and examined this patient.  Patient is here for routine follow-up visit diabetes and hypertension.  Patient complains of persistent intermittent left lower quadrant abdominal pain which has been going on the last couple months.  He also complains of increased pain in small joints in his fingers as well as his left elbow.  He denies any other complaints at this time and states that he is compliant with all his medications.  Review of Systems  Constitutional: Negative.   HENT: Negative.   Respiratory: Negative.   Cardiovascular: Negative.   Gastrointestinal: Negative.   Musculoskeletal: Positive for arthralgias.  Neurological: Negative.   Psychiatric/Behavioral: Negative.        Objective:   Physical Exam Constitutional:      Appearance: Normal appearance.  HENT:     Head: Normocephalic and atraumatic.  Cardiovascular:     Rate and Rhythm: Normal rate and regular rhythm.     Heart sounds: Normal heart sounds.  Pulmonary:     Effort: Pulmonary effort is normal.     Breath sounds: Normal breath sounds. No wheezing or rales.  Abdominal:     General: Bowel sounds are normal. There is no distension.     Palpations: Abdomen is soft.     Tenderness: There is abdominal tenderness. There is no guarding or rebound.     Comments: Mild tenderness to palpation over left lower quadrant extending to left flank  Musculoskeletal:        General: No swelling or tenderness.  Neurological:     General: No focal deficit present.     Mental Status: He is alert and oriented to person, place, and time.  Psychiatric:        Mood and Affect: Mood normal.        Behavior: Behavior normal.           Assessment & Plan:  Please see problem based charting for assessment and plan:

## 2020-02-20 NOTE — Assessment & Plan Note (Signed)
-  This problem is chronic and worsening -Patient now complains of pain in small joints of his fingers as well as his left elbow -He states that he uses over-the-counter Voltaren gel which helps a little bit as well as Norco for pain control -Given his worsening pain I will refer him back to rheumatology today for further evaluation -Patient needs to follow-up with rheumatology in the past but was unable to afford the co-pay but is willing to go back now given his worsening pain -No further work-up at this time

## 2020-02-20 NOTE — Assessment & Plan Note (Signed)
-  This problem is chronic and stable -Patient uses albuterol only rarely -Recommend smoking cessation today and importance of stopping smoking.  Patient states that he may try the nicotine patch again -No further work-up at this time

## 2020-02-20 NOTE — Assessment & Plan Note (Signed)
-  This problem is chronic and stable -Patient able to do daily activities on his current pain regimen which he otherwise would have difficulty doing -Currently the benefits of this outweigh its risks -No further work-up at this time.  We will continue with Norco for now

## 2020-02-20 NOTE — Assessment & Plan Note (Signed)
-  Patient noted a scaly rash over his left knee but this appears to have resolved now with medium potency steroid cream -On exam the rash appears to resolved -No further work-up at this time

## 2020-02-20 NOTE — Assessment & Plan Note (Signed)
-  Patient continues to have ongoing left-sided lower quadrant abdominal pain -Patient describes the pain as occasionally burning but sometimes sharp originating in his left lower quadrant and extending to his back as well as his groin -He had a testicular ultrasound as well as a renal ultrasound done which showed no acute pathology -On exam he has mild tenderness palpation in his left flank and left lower quadrant -We will check a CT abdomen pelvis to rule out any underlying pathology but I suspect this may be secondary to musculoskeletal pain -We will start the patient on Flexeril as needed to see if this helps with his pain -No further work-up at this time

## 2020-02-23 ENCOUNTER — Other Ambulatory Visit: Payer: Self-pay | Admitting: Internal Medicine

## 2020-02-23 DIAGNOSIS — Z79891 Long term (current) use of opiate analgesic: Secondary | ICD-10-CM

## 2020-02-23 DIAGNOSIS — N5201 Erectile dysfunction due to arterial insufficiency: Secondary | ICD-10-CM

## 2020-02-23 MED ORDER — TADALAFIL 5 MG PO TABS: 5.0000 mg | ORAL_TABLET | Freq: Every day | ORAL | 0 refills | Status: DC | PRN

## 2020-02-23 NOTE — Telephone Encounter (Signed)
Last rx written - 3/29-21; 3 of 3. Last OV 02/20/20. Next OV  Has not been scheduled. UDS 11/28/19.

## 2020-02-26 NOTE — Progress Notes (Signed)
[facility] Attending  Case discussed with Dr. Aslam at the time of the visit.  We reviewed the resident's history and exam and pertinent patient test results.  I agree with the assessment, diagnosis, and plan of care documented in the resident's note.  Esperanza Madrazo, M.D., Ph.D.  

## 2020-02-27 ENCOUNTER — Other Ambulatory Visit: Payer: Self-pay | Admitting: Internal Medicine

## 2020-02-27 DIAGNOSIS — Z79891 Long term (current) use of opiate analgesic: Secondary | ICD-10-CM

## 2020-02-27 NOTE — Telephone Encounter (Signed)
My imprivata is not working. Dr. Rebeca Alert has kindly agreed to fill this for me.

## 2020-02-27 NOTE — Telephone Encounter (Signed)
Pt has one script left from 3/29, the pharmacist has put the refill in, they will call pt

## 2020-02-28 MED ORDER — HYDROCODONE-ACETAMINOPHEN 7.5-325 MG PO TABS: 1.0000 | ORAL_TABLET | Freq: Four times a day (QID) | ORAL | 0 refills | Status: DC | PRN

## 2020-03-01 DIAGNOSIS — E1165 Type 2 diabetes mellitus with hyperglycemia: Secondary | ICD-10-CM | POA: Diagnosis not present

## 2020-03-21 ENCOUNTER — Ambulatory Visit (HOSPITAL_COMMUNITY): Payer: Medicare HMO

## 2020-03-26 ENCOUNTER — Other Ambulatory Visit: Payer: Self-pay | Admitting: Internal Medicine

## 2020-03-26 DIAGNOSIS — Z79891 Long term (current) use of opiate analgesic: Secondary | ICD-10-CM

## 2020-03-26 DIAGNOSIS — N5201 Erectile dysfunction due to arterial insufficiency: Secondary | ICD-10-CM

## 2020-03-27 MED ORDER — HYDROCODONE-ACETAMINOPHEN 7.5-325 MG PO TABS: 1.0000 | ORAL_TABLET | Freq: Four times a day (QID) | ORAL | 0 refills | Status: DC | PRN

## 2020-03-27 MED ORDER — TADALAFIL 5 MG PO TABS: 5.0000 mg | ORAL_TABLET | Freq: Every day | ORAL | 0 refills | Status: DC | PRN

## 2020-04-03 ENCOUNTER — Other Ambulatory Visit: Payer: Self-pay

## 2020-04-03 ENCOUNTER — Ambulatory Visit (HOSPITAL_COMMUNITY)
Admission: RE | Admit: 2020-04-03 | Discharge: 2020-04-03 | Disposition: A | Discharge status: Home / Self Care | Payer: Medicare HMO | Source: Ambulatory Visit | Attending: Internal Medicine | Admitting: Internal Medicine

## 2020-04-03 ENCOUNTER — Encounter (HOSPITAL_COMMUNITY): Payer: Self-pay

## 2020-04-03 DIAGNOSIS — R1032 Left lower quadrant pain: Secondary | ICD-10-CM | POA: Insufficient documentation

## 2020-04-03 DIAGNOSIS — I708 Atherosclerosis of other arteries: Secondary | ICD-10-CM | POA: Diagnosis not present

## 2020-04-03 DIAGNOSIS — I700 Atherosclerosis of aorta: Secondary | ICD-10-CM | POA: Diagnosis not present

## 2020-04-03 DIAGNOSIS — D171 Benign lipomatous neoplasm of skin and subcutaneous tissue of trunk: Secondary | ICD-10-CM | POA: Diagnosis not present

## 2020-04-04 DIAGNOSIS — H1045 Other chronic allergic conjunctivitis: Secondary | ICD-10-CM | POA: Diagnosis not present

## 2020-04-11 ENCOUNTER — Other Ambulatory Visit: Payer: Self-pay | Admitting: Internal Medicine

## 2020-04-11 DIAGNOSIS — E7800 Pure hypercholesterolemia, unspecified: Secondary | ICD-10-CM

## 2020-04-11 DIAGNOSIS — K219 Gastro-esophageal reflux disease without esophagitis: Secondary | ICD-10-CM

## 2020-04-15 ENCOUNTER — Encounter: Payer: Self-pay | Admitting: Internal Medicine

## 2020-04-16 ENCOUNTER — Other Ambulatory Visit: Payer: Self-pay | Admitting: Internal Medicine

## 2020-04-16 DIAGNOSIS — E1169 Type 2 diabetes mellitus with other specified complication: Secondary | ICD-10-CM

## 2020-04-16 DIAGNOSIS — Z794 Long term (current) use of insulin: Secondary | ICD-10-CM

## 2020-04-16 MED ORDER — BASAGLAR KWIKPEN 100 UNIT/ML ~~LOC~~ SOPN: 20.0000 [IU] | PEN_INJECTOR | Freq: Every day | SUBCUTANEOUS | 0 refills | Status: DC

## 2020-04-26 ENCOUNTER — Other Ambulatory Visit: Payer: Self-pay | Admitting: Internal Medicine

## 2020-04-26 DIAGNOSIS — N5201 Erectile dysfunction due to arterial insufficiency: Secondary | ICD-10-CM

## 2020-04-26 DIAGNOSIS — Z79891 Long term (current) use of opiate analgesic: Secondary | ICD-10-CM

## 2020-04-26 MED ORDER — TADALAFIL 5 MG PO TABS: 5.0000 mg | ORAL_TABLET | Freq: Every day | ORAL | 0 refills | Status: DC | PRN

## 2020-04-26 MED ORDER — HYDROCODONE-ACETAMINOPHEN 7.5-325 MG PO TABS: 1.0000 | ORAL_TABLET | Freq: Four times a day (QID) | ORAL | 0 refills | Status: DC | PRN

## 2020-05-02 DIAGNOSIS — H1045 Other chronic allergic conjunctivitis: Secondary | ICD-10-CM | POA: Diagnosis not present

## 2020-05-02 DIAGNOSIS — E119 Type 2 diabetes mellitus without complications: Secondary | ICD-10-CM | POA: Diagnosis not present

## 2020-05-02 DIAGNOSIS — H16123 Filamentary keratitis, bilateral: Secondary | ICD-10-CM | POA: Diagnosis not present

## 2020-05-02 DIAGNOSIS — H31002 Unspecified chorioretinal scars, left eye: Secondary | ICD-10-CM | POA: Diagnosis not present

## 2020-05-02 LAB — HM DIABETES EYE EXAM

## 2020-05-03 ENCOUNTER — Ambulatory Visit: Payer: Medicare HMO | Admitting: Podiatry

## 2020-05-06 ENCOUNTER — Encounter: Payer: Self-pay | Admitting: Dietician

## 2020-05-09 ENCOUNTER — Other Ambulatory Visit: Payer: Self-pay | Admitting: Internal Medicine

## 2020-05-09 DIAGNOSIS — I10 Essential (primary) hypertension: Secondary | ICD-10-CM

## 2020-05-09 DIAGNOSIS — E11618 Type 2 diabetes mellitus with other diabetic arthropathy: Secondary | ICD-10-CM

## 2020-05-20 ENCOUNTER — Telehealth: Payer: Self-pay | Admitting: Dietician

## 2020-05-20 DIAGNOSIS — E1165 Type 2 diabetes mellitus with hyperglycemia: Secondary | ICD-10-CM | POA: Diagnosis not present

## 2020-05-20 NOTE — Telephone Encounter (Signed)
Called to follow up on CGM use and diabetes self management goals of getting an eye exam. Mr. Newbern stated that his blood sugars are 120-130s and doing better with CGM. However is averages for 7, 14, 30 and 90 days are  174/171/183/211  Morning fasting blood sugars-148, 178, 145,161. He reports sleeping well and spikes around  Noon and 6 pm which is after dinner. He stated that his food intake has been off because they are short handed at work and he has been working long hours. He takes his diabetes medicine as ordered. He is interested in titration of  basalgar insulin to help lower fasting blood sugars to goal of < 130 if Dr. Dareen Piano agrees.

## 2020-05-21 NOTE — Telephone Encounter (Signed)
He can wait till you get back Butch Penny. Thank you for your help with this

## 2020-05-27 ENCOUNTER — Other Ambulatory Visit: Payer: Self-pay | Admitting: Internal Medicine

## 2020-05-27 DIAGNOSIS — N5201 Erectile dysfunction due to arterial insufficiency: Secondary | ICD-10-CM

## 2020-05-27 DIAGNOSIS — Z79891 Long term (current) use of opiate analgesic: Secondary | ICD-10-CM

## 2020-05-27 NOTE — Telephone Encounter (Signed)
I would like to increase it every week till his fasting sugars are < 130. Thank you

## 2020-05-27 NOTE — Telephone Encounter (Signed)
Called Mr. Ligas to advise him of Dr. Wilber Bihari order to increase the basaglar 2 units every 5-7 days. He reports that his blood sugar was 174 this am, 167 later and 155 at 1253 pm and that he took 20 units basaglar last night. He then went on to state that on a work day last week  it was 74 and then he was symptomatic at 33 when he had not eaten and worked all day, then [employer] his yard.  His averages are 7 day  Average 174, 14 day is 173.  He was advised not to skip meals and  Carry a treatment for low blood sugar with him at all times and to hold off on the increase of  the basalgar for now until this information is communicated to Dr. Dareen Piano.  He verbalized understanding.

## 2020-05-27 NOTE — Telephone Encounter (Signed)
Thank you. Would hold off on increasing for now

## 2020-05-27 NOTE — Telephone Encounter (Signed)
Hi Dr. Dareen Piano,   Would you like me to relay a change in his basal insulin or would you like him to increase it 2 units every 5-7 days until his fasting blood sugars are < 130 mg.dL? ?

## 2020-05-28 MED ORDER — TADALAFIL 5 MG PO TABS: 5.0000 mg | ORAL_TABLET | Freq: Every day | ORAL | 0 refills | Status: DC | PRN

## 2020-05-28 MED ORDER — HYDROCODONE-ACETAMINOPHEN 7.5-325 MG PO TABS: 1.0000 | ORAL_TABLET | Freq: Four times a day (QID) | ORAL | 0 refills | Status: DC | PRN

## 2020-05-29 ENCOUNTER — Ambulatory Visit: Payer: Medicare HMO

## 2020-05-29 ENCOUNTER — Other Ambulatory Visit: Payer: Self-pay | Admitting: Internal Medicine

## 2020-05-29 ENCOUNTER — Other Ambulatory Visit: Payer: Medicare HMO

## 2020-06-11 ENCOUNTER — Other Ambulatory Visit: Payer: Self-pay

## 2020-06-11 ENCOUNTER — Other Ambulatory Visit: Payer: Medicare HMO

## 2020-06-11 ENCOUNTER — Other Ambulatory Visit (HOSPITAL_COMMUNITY)
Admission: RE | Admit: 2020-06-11 | Discharge: 2020-06-11 | Disposition: A | Discharge status: Home / Self Care | Payer: Medicare HMO | Source: Ambulatory Visit | Attending: Infectious Diseases | Admitting: Infectious Diseases

## 2020-06-11 DIAGNOSIS — Z79899 Other long term (current) drug therapy: Secondary | ICD-10-CM

## 2020-06-11 DIAGNOSIS — Z113 Encounter for screening for infections with a predominantly sexual mode of transmission: Secondary | ICD-10-CM | POA: Insufficient documentation

## 2020-06-11 DIAGNOSIS — B20 Human immunodeficiency virus [HIV] disease: Secondary | ICD-10-CM | POA: Diagnosis not present

## 2020-06-12 LAB — T-HELPER CELL (CD4) - (RCID CLINIC ONLY)
CD4 % Helper T Cell: 45 % (ref 33–65)
CD4 T Cell Abs: 848 /uL (ref 400–1790)

## 2020-06-12 LAB — URINE CYTOLOGY ANCILLARY ONLY
Chlamydia: NEGATIVE
Comment: NEGATIVE
Comment: NORMAL
Neisseria Gonorrhea: NEGATIVE

## 2020-06-13 ENCOUNTER — Encounter: Payer: Medicare HMO | Admitting: Infectious Diseases

## 2020-06-13 ENCOUNTER — Other Ambulatory Visit: Payer: Self-pay | Admitting: Infectious Diseases

## 2020-06-13 DIAGNOSIS — B20 Human immunodeficiency virus [HIV] disease: Secondary | ICD-10-CM

## 2020-06-14 LAB — COMPREHENSIVE METABOLIC PANEL
AG Ratio: 1.8 (calc) (ref 1.0–2.5)
ALT: 21 U/L (ref 9–46)
AST: 19 U/L (ref 10–35)
Albumin: 4.3 g/dL (ref 3.6–5.1)
Alkaline phosphatase (APISO): 61 U/L (ref 35–144)
BUN: 20 mg/dL (ref 7–25)
CO2: 29 mmol/L (ref 20–32)
Calcium: 10.1 mg/dL (ref 8.6–10.3)
Chloride: 101 mmol/L (ref 98–110)
Creat: 0.93 mg/dL (ref 0.70–1.25)
Globulin: 2.4 g/dL (calc) (ref 1.9–3.7)
Glucose, Bld: 140 mg/dL — ABNORMAL HIGH (ref 65–99)
Potassium: 4.6 mmol/L (ref 3.5–5.3)
Sodium: 138 mmol/L (ref 135–146)
Total Bilirubin: 1 mg/dL (ref 0.2–1.2)
Total Protein: 6.7 g/dL (ref 6.1–8.1)

## 2020-06-14 LAB — CBC
HCT: 44.7 % (ref 38.5–50.0)
Hemoglobin: 16 g/dL (ref 13.2–17.1)
MCH: 32.7 pg (ref 27.0–33.0)
MCHC: 35.8 g/dL (ref 32.0–36.0)
MCV: 91.2 fL (ref 80.0–100.0)
MPV: 10.6 fL (ref 7.5–12.5)
Platelets: 61 10*3/uL — ABNORMAL LOW (ref 140–400)
RBC: 4.9 10*6/uL (ref 4.20–5.80)
RDW: 12.6 % (ref 11.0–15.0)
WBC: 10 10*3/uL (ref 3.8–10.8)

## 2020-06-14 LAB — LIPID PANEL
Cholesterol: 141 mg/dL (ref <200)
HDL: 41 mg/dL (ref >=40)
LDL Cholesterol (Calc): 74 mg/dL (calc)
Non-HDL Cholesterol (Calc): 100 mg/dL (calc) (ref <130)
Total CHOL/HDL Ratio: 3.4 (calc) (ref <5.0)
Triglycerides: 154 mg/dL — ABNORMAL HIGH (ref <150)

## 2020-06-14 LAB — RPR: RPR Ser Ql: NONREACTIVE

## 2020-06-14 LAB — HIV-1 RNA QUANT-NO REFLEX-BLD
HIV 1 RNA Quant: <20 Copies/mL — ABNORMAL HIGH
HIV-1 RNA Quant, Log: <1.3 Log cps/mL — ABNORMAL HIGH

## 2020-06-20 ENCOUNTER — Other Ambulatory Visit: Payer: Self-pay | Admitting: Internal Medicine

## 2020-06-27 ENCOUNTER — Other Ambulatory Visit: Payer: Self-pay | Admitting: Internal Medicine

## 2020-06-27 ENCOUNTER — Encounter: Payer: Self-pay | Admitting: Internal Medicine

## 2020-06-27 DIAGNOSIS — E1169 Type 2 diabetes mellitus with other specified complication: Secondary | ICD-10-CM

## 2020-06-27 DIAGNOSIS — N5201 Erectile dysfunction due to arterial insufficiency: Secondary | ICD-10-CM

## 2020-06-27 DIAGNOSIS — Z79891 Long term (current) use of opiate analgesic: Secondary | ICD-10-CM

## 2020-06-27 MED ORDER — BASAGLAR KWIKPEN 100 UNIT/ML ~~LOC~~ SOPN: 20.0000 [IU] | PEN_INJECTOR | Freq: Every day | SUBCUTANEOUS | 3 refills | Status: DC

## 2020-06-28 MED ORDER — HYDROCODONE-ACETAMINOPHEN 7.5-325 MG PO TABS: 1.0000 | ORAL_TABLET | Freq: Four times a day (QID) | ORAL | 0 refills | Status: DC | PRN

## 2020-06-28 MED ORDER — TADALAFIL 5 MG PO TABS: 5.0000 mg | ORAL_TABLET | Freq: Every day | ORAL | 0 refills | Status: DC | PRN

## 2020-07-02 ENCOUNTER — Other Ambulatory Visit: Payer: Self-pay

## 2020-07-02 ENCOUNTER — Encounter: Payer: Self-pay | Admitting: Infectious Diseases

## 2020-07-02 ENCOUNTER — Ambulatory Visit: Payer: Medicare HMO

## 2020-07-02 ENCOUNTER — Ambulatory Visit (INDEPENDENT_AMBULATORY_CARE_PROVIDER_SITE_OTHER): Payer: Medicare HMO | Admitting: Infectious Diseases

## 2020-07-02 VITALS — BP 145/77 | HR 73 | Temp 98.0°F | Wt 192.0 lb

## 2020-07-02 DIAGNOSIS — Z113 Encounter for screening for infections with a predominantly sexual mode of transmission: Secondary | ICD-10-CM | POA: Diagnosis not present

## 2020-07-02 DIAGNOSIS — I2510 Atherosclerotic heart disease of native coronary artery without angina pectoris: Secondary | ICD-10-CM | POA: Diagnosis not present

## 2020-07-02 DIAGNOSIS — B20 Human immunodeficiency virus [HIV] disease: Secondary | ICD-10-CM | POA: Diagnosis not present

## 2020-07-02 DIAGNOSIS — I10 Essential (primary) hypertension: Secondary | ICD-10-CM | POA: Diagnosis not present

## 2020-07-02 DIAGNOSIS — F17200 Nicotine dependence, unspecified, uncomplicated: Secondary | ICD-10-CM

## 2020-07-02 DIAGNOSIS — E11618 Type 2 diabetes mellitus with other diabetic arthropathy: Secondary | ICD-10-CM

## 2020-07-02 DIAGNOSIS — Z79899 Other long term (current) drug therapy: Secondary | ICD-10-CM

## 2020-07-02 NOTE — Progress Notes (Signed)
   Subjective:    Patient ID: Riley Jones, male    DOB: [birth year], 64 y.o.   MRN: [id number]  HPI 64yo M with hx of HIV+ (~61yrs), DM2 (>67yrs, on insulin) and RA (took MTX without relief, off prednisone), COPD. He has been maintained on atripla (has been on multiple previous meds). At his visit 07-2017 he was changed to biktarvy.  Also hx of smoking. Has cut back some. increaes with work stress. 1ppd. > 20years.  CAD s/p DES to mid LAD in 06/2014, his EF 55-65% at that time. He had a myoview 11-2014- low risk. Has been doing well, continues to work at [employer]- "they are working me to death".  His father died last week- 66 yo, stage IV CKD, CAD, COPD. Home hospice.  Mother died last year.  Has had eye exam, is hoping his sugars will be better- has monitor on his LUE.   HIV 1 RNA Quant  Date Value  06/11/2020 <20 Copies/mL (H)  08/09/2019 <20 DETECTED copies/mL (A)  10/25/2018 <20 DETECTED copies/mL (A)   CD4 T Cell Abs (/uL)  Date Value  06/11/2020 848  08/09/2019 615  10/25/2018 760    Review of Systems  Constitutional: Negative for appetite change and unexpected weight change.  Respiratory: Negative for cough and shortness of breath.   Cardiovascular: Negative for chest pain.  Gastrointestinal: Negative for constipation and diarrhea.  Genitourinary: Negative for difficulty urinating.  Neurological: Negative for headaches.       Objective:   Physical Exam Vitals reviewed.  HENT:     Mouth/Throat:     Mouth: Mucous membranes are moist.     Pharynx: No oropharyngeal exudate.  Eyes:     Extraocular Movements: Extraocular movements intact.     Pupils: Pupils are equal, round, and reactive to light.  Cardiovascular:     Rate and Rhythm: Normal rate and regular rhythm.  Pulmonary:     Effort: Pulmonary effort is normal.     Breath sounds: Normal breath sounds.  Abdominal:     General: Bowel sounds are normal. There is no distension.     Palpations: Abdomen is soft.      Tenderness: There is no abdominal tenderness.  Musculoskeletal:        General: Normal range of motion.     Cervical back: Normal range of motion and neck supple.     Right lower leg: No edema.     Left lower leg: No edema.  Neurological:     General: No focal deficit present.     Mental Status: He is alert.  Psychiatric:        Mood and Affect: Mood normal.           Assessment & Plan:

## 2020-07-02 NOTE — Assessment & Plan Note (Signed)
I offered to get him CT chest, screening for lung cancer.  Will order.

## 2020-07-02 NOTE — Assessment & Plan Note (Addendum)
He is doing well No change in biktarvy Offered/refused condoms.  He has gotten flu vax, covid vax. His PCV is up todate.  Has had colon x 2 (told to come back 7-10 yrs).  Will see him back in 9 months.

## 2020-07-02 NOTE — Assessment & Plan Note (Signed)
Mildly elevated today Appreciate IMTS f/u.

## 2020-07-02 NOTE — Assessment & Plan Note (Signed)
He has fair control by his history.  A1C 8.8% 11-2019 Appreciate IMTS f/u

## 2020-07-02 NOTE — Assessment & Plan Note (Signed)
Has CV f/u this week.  On asa, statin, betablocker.

## 2020-07-08 NOTE — Progress Notes (Deleted)
CARDIOLOGY OFFICE NOTE  Date:  07/10/2020    Riley Jones Date of Birth: March 29, 1956 Medical Record #[id number]  PCP:  Aldine Contes, MD  Cardiologist:  Gillian Shields    No chief complaint on file.   History of Present Illness: Riley Jones is a 65 y.o. male who presents today for a 6 month check.  Seen for Dr. Johnsie Cancel.   He has a history of knownCAD with LAD stent in 2015, ongoing tobacco use, DM-2, HIV, RA, HLD, HTN and chronic pain syndrome.   He washospitalized in 2018 forchest pain.During that time he had mildly elevated troponin at 0.03>0.04. His EKG showed RBBB with non-specific T wave abnormalities. Chest CT on 12/09/2016 demonstrated aortic atherosclerosis of the left main, LAD, LCX and RCA. There was also calcifications of the aortic and mitral annulus. Echocardiogram performed 02/28/2017 with LVEF of 60-65% with NWMA.He underwent aLexiscan myoview performed 03/10/2017 with LVEF estimated at 53%, considered a normal, low risk study with no signs of ischemia or infarct.He has been managed medically since.  He last saw Dr. Johnsie Cancel in 11/2016. He has followed with APP's since - saw Kathyrn Drown in October of 2019. Was doing ok - trying to cut back on tobacco use - was transitioning over to insulin pump - no chest pain noted.I did a telehealth visit with him back in April - lots of stress with his job at [employer] and family issues - was suppose to move to [place] to settle his mom's estate but this had been put on hold due to the pandemic. Was trying to smoke less. Did not transition to insulin pump. Overall he felt like he was doing ok.I saw him in the office back last October - never made it to [place] with the pandemic. Wanting to stop medicines. Last visit back in April - was doing ok - still smoking - had been vaccinated. He was happy with how he was doing.  Comes in today. Here with   Past Medical History:  Diagnosis Date  . Anxiety   . Arthritis     "pretty much all over"  . Asthma    "mild" (07/17/2014)  . Atypical angina ([place])    Myoview 2003, Normal EF 64%  . CAD (coronary artery disease)    a. 10/20 DES to 80% mid LAD  . COPD (chronic obstructive pulmonary disease) ([place])    "mild" (07/17/2014)  . Depression   . Foot pain 03/30/2011  . GERD (gastroesophageal reflux disease)   . H/O hiatal hernia   . Headache    "weekly" (06/2014)  . History of gout   . HIV (human immunodeficiency virus infection) ([place]) 1995   DX after shingles followed by Dr. Ola Spurr ([place])  . Hx of cardiovascular stress test    ETT-Myoview (3/16):  Ex 10:15, No Ischemia, EF 57%; Normal Study  . Hyperlipidemia   . Hypertension    Well Controlled off meds  . Kidney stones    "passed them all"  . Pneumonia 4-5 times  . Rheumatoid arthritis(714.0)    Followed by Dr. Charlestine Night, off MTX and prdnisone since 2007  . 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 Sleep apnea    does not wear mask (07/17/2014)  . Type II diabetes mellitus ([place])     Past Surgical History:  Procedure Laterality Date  . APPENDECTOMY  ~ 1981  . CARPAL TUNNEL RELEASE Left 1990's  . CORONARY ANGIOPLASTY WITH STENT PLACEMENT  07/17/2014   a. DES  to 80% mid LAD  . HERNIA REPAIR    . LAPAROSCOPIC INCISIONAL / UMBILICAL / VENTRAL HERNIA REPAIR  1990's   "it was a double; not inguinal"  . LEFT HEART CATHETERIZATION WITH CORONARY ANGIOGRAM N/A 07/17/2014   Procedure: LEFT HEART CATHETERIZATION WITH CORONARY ANGIOGRAM;  Surgeon: [name], MD;  Location: [facility] CATH LAB;  Service: Cardiovascular;  Laterality: N/A;  . PERCUTANEOUS STENT INTERVENTION  07/17/2014   Procedure: PERCUTANEOUS STENT INTERVENTION;  Surgeon: [name], MD;  Location: [facility] CATH LAB;  Service: Cardiovascular;;  . TONSILLECTOMY AND ADENOIDECTOMY  1990's  . TRIGGER FINGER RELEASE Left 1990's   2nd & 5th digits  . UMBILICAL HERNIA REPAIR  1980's   w/mesh  . UVULOPALATOPHARYNGOPLASTY  (UPPP)/TONSILLECTOMY/SEPTOPLASTY  1990's     Medications: No outpatient medications have been marked as taking for the 07/16/20 encounter (Appointment) with Burtis Junes, NP.     Allergies: Allergies  Allergen Reactions  . Penicillins Hives, Nausea Only and Rash    Has patient had a PCN reaction causing immediate rash, facial/tongue/throat swelling, SOB or lightheadedness with hypotension: Yes Has patient had a PCN reaction causing severe rash involving mucus membranes or skin necrosis: No Has patient had a PCN reaction that required hospitalization: No Has patient had a PCN reaction occurring within the last 10 years: No If all of the above answers are "NO", then may proceed with Cephalosporin use.   Marland Kitchen Zoloft [Sertraline Hcl] Other (See Comments)    Patient reported Psychomotor slowing / worsened depression  . Victoza [Liraglutide]     Did not tolerate this medication.  Complained of dizziness even at the lowest dose  . Chantix [Varenicline] Hives  . Lisinopril Cough  . [name] [Insulin Degludec] Rash    Social History: The patient  reports that he has been smoking. He has a 46.00 pack-year smoking history. He has never used smokeless tobacco. He reports previous alcohol use of about 1.0 standard drink of alcohol per week. He reports that he does not use drugs.   Family History: The patient's ***family history includes COPD in his father; Diabetes in his father and mother; Heart attack in his brother and father; Heart disease in his father; Osteoporosis in his mother; Stroke in his maternal grandfather, maternal grandmother, paternal grandfather, and paternal grandmother.   Review of Systems: Please see the history of present illness.   All other systems are reviewed and negative.   Physical Exam: VS:  There were no vitals taken for this visit. Marland Kitchen  BMI There is no height or weight on file to calculate BMI.  Wt Readings from Last 3 Encounters:  07/02/20 192 lb (87.1 kg)    02/20/20 196 lb 3.2 oz (89 kg)  02/12/20 198 lb 6.4 oz (90 kg)    General: Pleasant. Well developed, well nourished and in no acute distress.   HEENT: Normal.  Neck: Supple, no JVD, carotid bruits, or masses noted.  Cardiac: ***Regular rate and rhythm. No murmurs, rubs, or gallops. No edema.  Respiratory:  Lungs are clear to auscultation bilaterally with normal work of breathing.  GI: Soft and nontender.  MS: No deformity or atrophy. Gait and ROM intact.  Skin: Warm and dry. Color is normal.  Neuro:  Strength and sensation are intact and no gross focal deficits noted.  Psych: Alert, appropriate and with normal affect.   LABORATORY DATA:  EKG:  EKG {ACTION; IS/IS HWE:[id number]} ordered today.  Personally reviewed by me. This demonstrates ***.  Lab Results  Component Value Date   WBC 10.0 06/11/2020   HGB 16.0 06/11/2020   HCT 44.7 06/11/2020   PLT 61 (L) 06/11/2020   GLUCOSE 140 (H) 06/11/2020   CHOL 141 06/11/2020   TRIG 154 (H) 06/11/2020   HDL 41 06/11/2020   LDLCALC 74 06/11/2020   ALT 21 06/11/2020   AST 19 06/11/2020   NA 138 06/11/2020   K 4.6 06/11/2020   CL 101 06/11/2020   CREATININE 0.93 06/11/2020   BUN 20 06/11/2020   CO2 29 06/11/2020   TSH 1.590 06/22/2019   PSA 1.09 05/07/2009   INR 0.9 07/10/2014   HGBA1C 8.8 (A) 11/28/2019   MICROALBUR 1.9 11/22/2014     BNP (last 3 results) No results for input(s): BNP in the last 8760 hours.  ProBNP (last 3 results) No results for input(s): PROBNP in the last 8760 hours.   Other Studies Reviewed Today:  EchoStudy Conclusions6/2018  - Left ventricle: The cavity size was normal. Wall thickness was increased in a pattern of mild LVH. Systolic function was normal. The estimated ejection fraction was in the range of 60% to 65%. Wall motion was normal; there were no regional wall motion abnormalities. - Aortic valve: Trileaflet; mildly calcified leaflets. - Mitral valve: Calcified annulus. -  Right ventricle: The cavity size was mildly dilated. Wall thickness was normal. Systolic function was normal.  MyoviewStudy Result6/2018    There was no ST segment deviation noted during stress.  The study is normal.  This is a low risk study.  Nuclear stress EF: 53%.  Low risk stress nuclear study with normal perfusion and low normal left ventricular global systolic function.     ASSESSMENT &PLAN:    1. CAD with prior LAD PCI from 2015 - with known residual disease - otherwise managed medically. He has no worrisome symptoms. Needs aggressive CV risk factor modification. Continue aspirin/statin/beta blocker and DAPT.   2. Tobacco abuse - total cessation encouraged.   3. DM - per PCP  4. HLD - on statin therapy - lab today.   5. HTN - BP is fine on current regimen - no changes made today.     Current medicines are reviewed with the patient today.  The patient does not have concerns regarding medicines other than what has been noted above.  The following changes have been made:  See above.  Labs/ tests ordered today include:   No orders of the defined types were placed in this encounter.    Disposition:   FU with *** in {gen number 9-41:[id number]} {Days to years:10300}.   Patient is agreeable to this plan and will call if any problems develop in the interim.   SignedTruitt Merle, NP  07/10/2020 9:43 AM  [facility] [address] [place],   [address] Phone: [phone] Fax: [phone]

## 2020-07-12 ENCOUNTER — Other Ambulatory Visit: Payer: Self-pay | Admitting: Internal Medicine

## 2020-07-12 ENCOUNTER — Other Ambulatory Visit: Payer: Self-pay | Admitting: Infectious Diseases

## 2020-07-12 DIAGNOSIS — K219 Gastro-esophageal reflux disease without esophagitis: Secondary | ICD-10-CM

## 2020-07-12 DIAGNOSIS — B20 Human immunodeficiency virus [HIV] disease: Secondary | ICD-10-CM

## 2020-07-12 DIAGNOSIS — E7800 Pure hypercholesterolemia, unspecified: Secondary | ICD-10-CM

## 2020-07-16 ENCOUNTER — Ambulatory Visit: Payer: Medicare HMO | Admitting: Nurse Practitioner

## 2020-07-18 ENCOUNTER — Ambulatory Visit
Admission: RE | Admit: 2020-07-18 | Discharge: 2020-07-18 | Disposition: A | Discharge status: Home / Self Care | Payer: Medicare HMO | Source: Ambulatory Visit | Attending: Infectious Diseases | Admitting: Infectious Diseases

## 2020-07-18 DIAGNOSIS — I2510 Atherosclerotic heart disease of native coronary artery without angina pectoris: Secondary | ICD-10-CM | POA: Diagnosis not present

## 2020-07-18 DIAGNOSIS — F17200 Nicotine dependence, unspecified, uncomplicated: Secondary | ICD-10-CM

## 2020-07-18 DIAGNOSIS — F17210 Nicotine dependence, cigarettes, uncomplicated: Secondary | ICD-10-CM | POA: Diagnosis not present

## 2020-07-18 DIAGNOSIS — J432 Centrilobular emphysema: Secondary | ICD-10-CM | POA: Diagnosis not present

## 2020-07-18 DIAGNOSIS — I358 Other nonrheumatic aortic valve disorders: Secondary | ICD-10-CM | POA: Diagnosis not present

## 2020-07-22 ENCOUNTER — Other Ambulatory Visit: Payer: Self-pay | Admitting: Infectious Diseases

## 2020-07-22 ENCOUNTER — Telehealth: Payer: Self-pay | Admitting: Infectious Diseases

## 2020-07-22 ENCOUNTER — Telehealth: Payer: Self-pay | Admitting: *Deleted

## 2020-07-22 DIAGNOSIS — J42 Unspecified chronic bronchitis: Secondary | ICD-10-CM

## 2020-07-22 DIAGNOSIS — I2510 Atherosclerotic heart disease of native coronary artery without angina pectoris: Secondary | ICD-10-CM

## 2020-07-22 DIAGNOSIS — F17200 Nicotine dependence, unspecified, uncomplicated: Secondary | ICD-10-CM

## 2020-07-22 NOTE — Telephone Encounter (Signed)
messsage left on pt VM that his recent CT scan showed a "scar" and that he needs a repeat in 3 months.

## 2020-07-22 NOTE — Telephone Encounter (Signed)
Received call from Radiology notifying Dr Johnnye Sima that the Chest CT results available for review. Results are non-urgent. Landis Gandy, RN

## 2020-07-22 NOTE — Telephone Encounter (Signed)
Reviewed.  Left VM for pt that there is a "scar" and he needs repeat CT in 3 months.  CT ordered, reminder placed in Epic.  thanks

## 2020-07-22 NOTE — Telephone Encounter (Signed)
Will 'cc Margaret.

## 2020-07-23 ENCOUNTER — Telehealth: Payer: Self-pay | Admitting: Infectious Diseases

## 2020-07-23 NOTE — Telephone Encounter (Signed)
Called pt and discussed his CT scan. There is a neew area of post-infectious inflammation or scar that needs f/u in 3 months due to the inability to definitively identify this.  He understood and agrees to repeat scan Reminder entered in [place].

## 2020-07-25 ENCOUNTER — Other Ambulatory Visit: Payer: Self-pay | Admitting: Internal Medicine

## 2020-07-25 DIAGNOSIS — I10 Essential (primary) hypertension: Secondary | ICD-10-CM

## 2020-07-29 ENCOUNTER — Other Ambulatory Visit: Payer: Self-pay | Admitting: Internal Medicine

## 2020-07-29 DIAGNOSIS — Z79891 Long term (current) use of opiate analgesic: Secondary | ICD-10-CM

## 2020-07-29 DIAGNOSIS — N5201 Erectile dysfunction due to arterial insufficiency: Secondary | ICD-10-CM

## 2020-07-30 MED ORDER — TADALAFIL 5 MG PO TABS: 5.0000 mg | ORAL_TABLET | Freq: Every day | ORAL | 0 refills | Status: DC | PRN

## 2020-07-31 MED ORDER — HYDROCODONE-ACETAMINOPHEN 7.5-325 MG PO TABS: 1.0000 | ORAL_TABLET | Freq: Four times a day (QID) | ORAL | 0 refills | Status: DC | PRN

## 2020-08-06 NOTE — Progress Notes (Signed)
CARDIOLOGY OFFICE NOTE  Date:  08/20/2020    Riley Jones Date of Birth: [birth year] Medical Record #[id number]  PCP:  Riley Contes, MD  Cardiologist:  Riley Jones    Chief Complaint  Patient presents with  . Follow-up    Seen for Riley Jones    History of Present Illness: Riley Jones is a 64 y.o. male who presents today for a 7 month check. Seen for Riley Jones.   He has a history of knownCAD with LAD stent in 2015, ongoing tobacco use, DM-2, HIV, RA, HLD, HTN and chronic pain syndrome.   He washospitalized in 2018 forchest pain.During that time he had mildly elevated troponin at 0.03>0.04. His EKG showed RBBB with non-specific T wave abnormalities. Chest CT on 12/09/2016 demonstrated aortic atherosclerosis of the left main, LAD, LCX and RCA. There was also calcifications of the aortic and mitral annulus. Echocardiogram performed 02/28/2017 with LVEF of 60-65% with NWMA.He underwent aLexiscan myoview performed 03/10/2017 with LVEF estimated at 53%, considered a normal, low risk study with no signs of ischemia or infarct.He has been managed medically since.  He last saw Riley Jones in 11/2016. He has followed with APP's since - saw Riley Jones in October of 2019. Was doing ok - trying to cut back on tobacco use - was transitioning over to insulin pump - no chest pain noted.I did a telehealth visit with him back in April of 2020 - lots of stress with his job at [employer] and family issues - was suppose to move to [place] to settle his mom's estate but this got put on hold due to the pandemic. Was trying to smoke less. Did not transition to insulin pump. Overall he felt like he was doing ok.I saw him in the office back in April - he was doing ok - continued to smoke - had been vaccinated.   Comes in today. Here alone. He feels like he is doing ok. He has cut his hours at [employer] by choice. He is still smoking - about a pack a day - he is strongly considering  stopping - he has done this before - stopped for 5 years. He admits he gets "ticked off easily" - seems to be his trigger. His father died a few months ago up in [place]. No chest pain. Breathing is stable. Not dizzy or lightheaded. BP looks good. Had recent labs. Has had recent lung cancer screening - noted aortic atherosclerosis - remains on statin. Also new place noted that may be post infection/inflammatory - or developing neoplasm - for repeat scan in January.   Past Medical History:  Diagnosis Date  . Anxiety   . Arthritis    "pretty much all over"  . Asthma    "mild" (07/17/2014)  . Atypical angina ([place])    Myoview 2003, Normal EF 64%  . CAD (coronary artery disease)    a. 10/20 DES to 80% mid LAD  . COPD (chronic obstructive pulmonary disease) ([place])    "mild" (07/17/2014)  . Depression   . Foot pain 03/30/2011  . GERD (gastroesophageal reflux disease)   . H/O hiatal hernia   . Headache    "weekly" (06/2014)  . History of gout   . HIV (human immunodeficiency virus infection) ([place]) 1995   DX after shingles followed by Riley Jones (ID)  . Hx of cardiovascular stress test    ETT-Myoview (3/16):  Ex 10:15, No Ischemia, EF 57%; Normal Study  . Hyperlipidemia   .  Hypertension    Well Controlled off meds  . Kidney stones    "passed them all"  . Pneumonia 4-5 times  . Rheumatoid arthritis(714.0)    Followed by Riley Jones, off MTX and prdnisone since 2007  . 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 Sleep apnea    does not wear mask (07/17/2014)  . Type II diabetes mellitus ([place])     Past Surgical History:  Procedure Laterality Date  . APPENDECTOMY  ~ 1981  . CARPAL TUNNEL RELEASE Left 1990's  . CORONARY ANGIOPLASTY WITH STENT PLACEMENT  07/17/2014   a. DES to 80% mid LAD  . HERNIA REPAIR    . LAPAROSCOPIC INCISIONAL / UMBILICAL / VENTRAL HERNIA REPAIR  1990's   "it was a double; not inguinal"  . LEFT HEART CATHETERIZATION WITH CORONARY ANGIOGRAM N/A 07/17/2014     Procedure: LEFT HEART CATHETERIZATION WITH CORONARY ANGIOGRAM;  Surgeon: Riley Hector, MD;  Location: [facility] CATH LAB;  Service: Cardiovascular;  Laterality: N/A;  . PERCUTANEOUS STENT INTERVENTION  07/17/2014   Procedure: PERCUTANEOUS STENT INTERVENTION;  Surgeon: Riley Hector, MD;  Location: [facility] CATH LAB;  Service: Cardiovascular;;  . TONSILLECTOMY AND ADENOIDECTOMY  1990's  . TRIGGER FINGER RELEASE Left 1990's   2nd & 5th digits  . UMBILICAL HERNIA REPAIR  1980's   w/mesh  . UVULOPALATOPHARYNGOPLASTY (UPPP)/TONSILLECTOMY/SEPTOPLASTY  1990's     Medications: Current Meds  Medication Sig  . albuterol (VENTOLIN HFA) 108 (90 Base) MCG/ACT inhaler INHALE 1 PUFF BY MOUTH EVERY 4 HOURS AS NEEDED FOR SHORTNESS OF BREATH OR MAY USE EVERY 6 HOURS AS NEEDED  . aspirin 81 MG EC tablet Take 81 mg by mouth daily.    Marland Kitchen atorvastatin (LIPITOR) 40 MG tablet Take 1 tablet by mouth once daily  . BD PEN NEEDLE NANO 2ND GEN 32G X 4 MM MISC USE UP TO 8 PER WEEK  . betamethasone dipropionate 0.05 % cream Apply topically 2 (two) times daily.  Marland Kitchen BIKTARVY 50-200-25 MG TABS tablet Take 1 tablet by mouth once daily  . carvedilol (COREG) 3.125 MG tablet TAKE 1 TABLET BY MOUTH TWICE DAILY WITH A MEAL  . clopidogrel (PLAVIX) 75 MG tablet Take 1 tablet by mouth once daily  . Empagliflozin-metFORMIN HCl ER (SYNJARDY XR) 01-999 MG TB24 Take 1 tablet by mouth 2 (two) times daily with a meal.  . fluticasone (FLONASE) 50 MCG/ACT nasal spray Place 1 spray into both nostrils daily.  Marland Kitchen HYDROcodone-acetaminophen (NORCO) 7.5-325 MG tablet Take 1 tablet by mouth every 6 (six) hours as needed for moderate pain.  . Insulin Glargine (BASAGLAR KWIKPEN) 100 UNIT/ML Inject 20 Units into the skin daily.  Marland Kitchen loratadine (CLARITIN) 10 MG tablet Take 1 tablet (10 mg total) by mouth daily.  Marland Kitchen losartan-hydrochlorothiazide (HYZAAR) 50-12.5 MG tablet Take 1 tablet by mouth once daily  . Multiple Vitamins-Minerals (CENTRUM ADULTS PO) Take 1  tablet by mouth daily.  . nitroGLYCERIN (NITROSTAT) 0.4 MG SL tablet PLACE 1 TABLET UNDER THE TONGUE EVERY 5 MINUTES AS NEEDED FOR CHEST PAIN  . nystatin (NYSTATIN) powder Apply 1 application topically 2 (two) times daily.  Marland Kitchen omeprazole (PRILOSEC) 20 MG capsule TAKE 1 CAPSULE BY MOUTH AS NEEDED  . OZEMPIC, 1 MG/DOSE, 2 MG/1.5ML SOPN INJECT 1 MG INTO THE SKIN ONCE A WEEK.  . tadalafil (CIALIS) 5 MG tablet Take 1 tablet (5 mg total) by mouth daily as needed for erectile dysfunction. Use as directed  . terazosin (HYTRIN) 2 MG capsule Take 1 capsule by  mouth at bedtime     Allergies: Allergies  Allergen Reactions  . Penicillins Hives, Nausea Only and Rash    Has patient had a PCN reaction causing immediate rash, facial/tongue/throat swelling, SOB or lightheadedness with hypotension: Yes Has patient had a PCN reaction causing severe rash involving mucus membranes or skin necrosis: No Has patient had a PCN reaction that required hospitalization: No Has patient had a PCN reaction occurring within the last 10 years: No If all of the above answers are "NO", then may proceed with Cephalosporin use.   Marland Kitchen Zoloft [Sertraline Hcl] Other (See Comments)    Patient reported Psychomotor slowing / worsened depression  . Victoza [Liraglutide]     Did not tolerate this medication.  Complained of dizziness even at the lowest dose  . Chantix [Varenicline] Hives  . Lisinopril Cough  . [name] [Insulin Degludec] Rash    Social History: The patient  reports that he has been smoking. He has a 46.00 pack-year smoking history. He has never used smokeless tobacco. He reports previous alcohol use of about 1.0 standard drink of alcohol per week. He reports that he does not use drugs.   Family History: The patient's family history includes COPD in his father; Diabetes in his father and mother; Heart attack in his brother and father; Heart disease in his father; Osteoporosis in his mother; Stroke in his maternal  grandfather, maternal grandmother, paternal grandfather, and paternal grandmother.   Review of Systems: Please see the history of present illness.   All other systems are reviewed and negative.   Physical Exam: VS:  BP 122/68   Pulse 72   Ht 5\' 10"  (1.778 m)   Wt 192 lb 12.8 oz (87.5 kg)   SpO2 97%   BMI 27.66 kg/m  .  BMI Body mass index is 27.66 kg/m.  Wt Readings from Last 3 Encounters:  08/20/20 192 lb 12.8 oz (87.5 kg)  07/02/20 192 lb (87.1 kg)  02/20/20 196 lb 3.2 oz (89 kg)    General: Alert and in no acute distress.   Cardiac: Regular rate and rhythm. Heart tones are distant. No edema.  Respiratory:  Lungs are coarse but with normal work of breathing.  GI: Soft and nontender.  MS: No deformity or atrophy. Gait and ROM intact.  Skin: Warm and dry. Color is normal.  Neuro:  Strength and sensation are intact and no gross focal deficits noted.  Psych: Alert, appropriate and with normal affect.   LABORATORY DATA:  EKG:  EKG is ordered today.  Personally reviewed by me. This demonstrates NSR with PAC noted. HR is 72 today.   Lab Results  Component Value Date   WBC 10.0 06/11/2020   HGB 16.0 06/11/2020   HCT 44.7 06/11/2020   PLT 61 (L) 06/11/2020   GLUCOSE 140 (H) 06/11/2020   CHOL 141 06/11/2020   TRIG 154 (H) 06/11/2020   HDL 41 06/11/2020   LDLCALC 74 06/11/2020   ALT 21 06/11/2020   AST 19 06/11/2020   NA 138 06/11/2020   K 4.6 06/11/2020   CL 101 06/11/2020   CREATININE 0.93 06/11/2020   BUN 20 06/11/2020   CO2 29 06/11/2020   TSH 1.590 06/22/2019   PSA 1.09 05/07/2009   INR 0.9 07/10/2014   HGBA1C 8.8 (A) 11/28/2019   MICROALBUR 1.9 11/22/2014     BNP (last 3 results) No results for input(s): BNP in the last 8760 hours.  ProBNP (last 3 results) No results for input(s): PROBNP in  the last 8760 hours.   Other Studies Reviewed Today:  EchoStudy Conclusions6/2018  - Left ventricle: The cavity size was normal. Wall thickness  was increased in a pattern of mild LVH. Systolic function was normal. The estimated ejection fraction was in the range of 60% to 65%. Wall motion was normal; there were no regional wall motion abnormalities. - Aortic valve: Trileaflet; mildly calcified leaflets. - Mitral valve: Calcified annulus. - Right ventricle: The cavity size was mildly dilated. Wall thickness was normal. Systolic function was normal.  MyoviewStudy Result6/2018    There was no ST segment deviation noted during stress.  The study is normal.  This is a low risk study.  Nuclear stress EF: 53%.  Low risk stress nuclear study with normal perfusion and low normal left ventricular global systolic function.     ASSESSMENT &PLAN:    1. CAD - prior LAD PCI from 2015 - known residual disease - needs aggressive CV risk factor modification. He has no worrisome symptoms - seems to be getting more motivated to stop smoking. He has been maintained on chronic DAPT, statin and beta blocker.   2. Tobacco abuse - total cessation encouraged - he is considering - has been successful for stopping in the past for 5 years. He is thinking of calling the 1-800 number - he has not tolerated Chantix in the past.   3. DM - uncontrolled.   4. HLD - on statin - labs from October noted.   5. HTN - BP looks good on his current regimen - no changes made today.   6. HIV - followed by Dr. Johnnye Sima  7. Abnormal chest CT - for follow up scan in January. Smoking cessation strongly encouraged - he is aware.   8. Decreased platelet count - this looks new - will need to get repeated. He did not endorse any bleeding/bruising to me.   Current medicines are reviewed with the patient today.  The patient does not have concerns regarding medicines other than what has been noted above.  The following changes have been made:  See above.  Labs/ tests ordered today include:    Orders Placed This Encounter  Procedures  . EKG  12-Lead     Disposition:   FU with Riley Jones in 6 months. He is aware that I am leaving in February.    Patient is agreeable to this plan and will call if any problems develop in the interim.   SignedTruitt Merle, NP  08/20/2020 10:54 AM  [facility] [address] [place], [place]  [address] Phone: [phone] Fax: [phone]

## 2020-08-07 ENCOUNTER — Other Ambulatory Visit: Payer: Self-pay | Admitting: Internal Medicine

## 2020-08-07 DIAGNOSIS — I10 Essential (primary) hypertension: Secondary | ICD-10-CM

## 2020-08-08 DIAGNOSIS — E1165 Type 2 diabetes mellitus with hyperglycemia: Secondary | ICD-10-CM | POA: Diagnosis not present

## 2020-08-20 ENCOUNTER — Encounter: Payer: Self-pay | Admitting: Nurse Practitioner

## 2020-08-20 ENCOUNTER — Ambulatory Visit: Payer: Medicare HMO | Admitting: Nurse Practitioner

## 2020-08-20 ENCOUNTER — Other Ambulatory Visit: Payer: Self-pay

## 2020-08-20 VITALS — BP 122/68 | HR 72 | Ht 70.0 in | Wt 192.8 lb

## 2020-08-20 DIAGNOSIS — E7849 Other hyperlipidemia: Secondary | ICD-10-CM | POA: Diagnosis not present

## 2020-08-20 DIAGNOSIS — I2510 Atherosclerotic heart disease of native coronary artery without angina pectoris: Secondary | ICD-10-CM | POA: Diagnosis not present

## 2020-08-20 DIAGNOSIS — I10 Essential (primary) hypertension: Secondary | ICD-10-CM

## 2020-08-20 NOTE — Patient Instructions (Addendum)
After Visit Summary:  We will be checking the following labs today - NONE   Medication Instructions:    Continue with your current medicines.    If you need a refill on your cardiac medications before your next appointment, please call your pharmacy.     Testing/Procedures To Be Arranged:  N/A  Follow-Up:   See Dr. Johnsie Cancel in 6 months -  You will receive a reminder letter in the mail two months in advance. If you don't receive a letter, please call our office to schedule the follow-up appointment.     At [facility], you and your health needs are our priority.  As part of our continuing mission to provide you with exceptional heart care, we have created designated Provider Care Teams.  These Care Teams include your primary Cardiologist (physician) and Advanced Practice Providers (APPs -  Physician Assistants and Nurse Practitioners) who all work together to provide you with the care you need, when you need it.  Special Instructions:  . Stay safe, wash your hands for at least 20 seconds and wear a mask when needed.  . It was good to talk with you today.    Call the [place] office at [phone] if you have any questions, problems or concerns.

## 2020-08-27 ENCOUNTER — Other Ambulatory Visit: Payer: Self-pay | Admitting: Internal Medicine

## 2020-08-27 DIAGNOSIS — Z79891 Long term (current) use of opiate analgesic: Secondary | ICD-10-CM

## 2020-08-27 DIAGNOSIS — N5201 Erectile dysfunction due to arterial insufficiency: Secondary | ICD-10-CM

## 2020-08-27 MED ORDER — TADALAFIL 5 MG PO TABS: 5.0000 mg | ORAL_TABLET | Freq: Every day | ORAL | 0 refills | Status: DC | PRN

## 2020-08-27 MED ORDER — HYDROCODONE-ACETAMINOPHEN 7.5-325 MG PO TABS: 1.0000 | ORAL_TABLET | Freq: Four times a day (QID) | ORAL | 0 refills | Status: DC | PRN

## 2020-08-28 MED FILL — HYDROCOD/ACETAM 7.5-325MG TAB: 15 days supply | Qty: 60 | Fill #0

## 2020-09-02 ENCOUNTER — Telehealth: Payer: Self-pay | Admitting: *Deleted

## 2020-09-02 NOTE — Telephone Encounter (Signed)
lvm per Truitt Merle, NP, pt needs to come in for a CBC.  Appt time, date and lab needs to be placed in the system.

## 2020-09-03 ENCOUNTER — Other Ambulatory Visit: Payer: Self-pay | Admitting: *Deleted

## 2020-09-03 DIAGNOSIS — D75839 Thrombocytosis, unspecified: Secondary | ICD-10-CM

## 2020-09-03 NOTE — Telephone Encounter (Signed)
lvm pt needed to call office to set up appt for CBC, CBC placed in system.

## 2020-09-06 ENCOUNTER — Encounter: Payer: Self-pay | Admitting: *Deleted

## 2020-09-06 NOTE — Progress Notes (Unsigned)

## 2020-09-07 MED FILL — SYNJARDY XR 5-1000MG TAB: 30 days supply | Qty: 60 | Fill #10

## 2020-09-07 MED FILL — BIKTARVY TAB: 30 days supply | Qty: 30 | Fill #2

## 2020-09-09 NOTE — Progress Notes (Unsigned)
Things That May Be Affecting Your Health:  Alcohol  Hearing loss  Pain   x Depression  Home Safety  Sexual Health  x Diabetes  Lack of physical activity  Stress   Difficulty with daily activities  Loneliness  Tiredness   Drug use  Medicines x Tobacco use   Falls  Motor Vehicle Safety  Weight   Food choices  Oral Health  Other    YOUR PERSONALIZED HEALTH PLAN : 1. Schedule your next subsequent Medicare Wellness visit in one year 2. Attend all of your regular appointments to address your medical issues 3. Complete the preventative screenings and services   Annual Wellness Visit   Medicare Covered Preventative Screenings and [place] Men and Women Who How Often Need? Date of Last Service Action  Abdominal Aortic Aneurysm Adults with AAA risk factors Once     Alcohol Misuse and Counseling All Adults Screening once a year if no alcohol misuse. Counseling up to 4 face to face sessions.     Bone Density Measurement  Adults at risk for osteoporosis Once every 2 yrs     Lipid Panel Z13.6 All adults without CV disease Once every 5 yrs     Colorectal Cancer   Stool sample or  Colonoscopy All adults 59 and older   Once every year  Every 10 years     Depression All Adults Once a year  Today   Diabetes Screening Blood glucose, post glucose load, or GTT Z13.1  All adults at risk  Pre-diabetics  Once per year  Twice per year x    Diabetes  Self-Management Training All adults Diabetics 10 hrs first year; 2 hours subsequent years. Requires Copay     Glaucoma  Diabetics  Family history of glaucoma  African Americans 14 yrs +  Hispanic Americans 51 yrs + Annually - requires coppay     Hepatitis C Z72.89 or F19.20  High Risk for HCV  Born between 1945 and 1965  Annually  Once     HIV Z11.4 All adults based on risk  Annually btw ages 28 & 23 regardless of risk  Annually > 65 yrs if at increased risk     Lung Cancer Screening Asymptomatic adults aged  61-77 with 30 pack yr history and current smoker OR quit within the last 15 yrs Annually Must have counseling and shared decision making documentation before first screen x    Medical Nutrition Therapy Adults with   Diabetes  Renal disease  Kidney transplant within past 3 yrs 3 hours first year; 2 hours subsequent years x    Obesity and Counseling All adults Screening once a year Counseling if BMI 30 or higher  Today   Tobacco Use Counseling Adults who use tobacco  Up to 8 visits in one year     Vaccines Z23  Hepatitis B  Influenza   Pneumonia  Adults   Once  Once every flu season  Two different vaccines separated by one year     Next Annual Wellness Visit People with Medicare Every year  Today     Services & Screenings Women Who How Often Need  Date of Last Service Action  Mammogram  Z12.31 Women over 71 One baseline ages 54-39. Annually ager 40 yrs+     Pap tests All women Annually if high risk. Every 2 yrs for normal risk women     Screening for cervical cancer with   Pap (Z01.419 nl or Z01.411abnl) &  HPV Z11.51 Women aged 51 to 43 Once every 5 yrs     Screening pelvic and breast exams All women Annually if high risk. Every 2 yrs for normal risk women     Sexually Transmitted Diseases  Chlamydia  Gonorrhea  Syphilis All at risk adults Annually for non pregnant females at increased risk         [place] Men Who How Ofter Need  Date of Last Service Action  Prostate Cancer - DRE & PSA Men over 50 Annually.  DRE might require a copay.     Sexually Transmitted Diseases  Syphilis All at risk adults Annually for men at increased risk

## 2020-09-17 MED FILL — OZEMPIC 4MG/3ML 1MG/DOSE PEN: 84 days supply | Qty: 9 | Fill #0

## 2020-09-17 NOTE — Telephone Encounter (Signed)
lvm for pt to call office to schedule appt due to being on plavix pt needs to get hgb checked with a CBC.

## 2020-09-28 ENCOUNTER — Other Ambulatory Visit: Payer: Self-pay | Admitting: Internal Medicine

## 2020-09-28 DIAGNOSIS — Z79891 Long term (current) use of opiate analgesic: Secondary | ICD-10-CM

## 2020-09-28 DIAGNOSIS — N5201 Erectile dysfunction due to arterial insufficiency: Secondary | ICD-10-CM

## 2020-09-30 ENCOUNTER — Other Ambulatory Visit: Payer: Self-pay | Admitting: Infectious Diseases

## 2020-09-30 DIAGNOSIS — Z0000 Encounter for general adult medical examination without abnormal findings: Secondary | ICD-10-CM

## 2020-09-30 DIAGNOSIS — J42 Unspecified chronic bronchitis: Secondary | ICD-10-CM

## 2020-09-30 MED ORDER — HYDROCODONE-ACETAMINOPHEN 7.5-325 MG PO TABS: 1.0000 | ORAL_TABLET | Freq: Four times a day (QID) | ORAL | 0 refills | Status: DC | PRN

## 2020-09-30 MED ORDER — TADALAFIL 5 MG PO TABS: 5.0000 mg | ORAL_TABLET | Freq: Every day | ORAL | 0 refills | Status: DC | PRN

## 2020-09-30 MED FILL — HYDROCOD/ACETAM 7.5-325MG TAB: 15 days supply | Qty: 60 | Fill #0

## 2020-09-30 MED FILL — TADALAFIL 5MG       TAB: 10 days supply | Qty: 10 | Fill #0

## 2020-09-30 NOTE — Telephone Encounter (Signed)
Medication: Norco Last office visit: 02/20/2020 Last UDS: 11/28/2019 Last Refill:  Last written 08/27/20 Next appt: none scheduled

## 2020-10-03 MED FILL — CLOPIDOGREL 75MG    TAB: 90 days supply | Qty: 90 | Fill #3

## 2020-10-08 ENCOUNTER — Other Ambulatory Visit: Payer: Self-pay | Admitting: Internal Medicine

## 2020-10-08 DIAGNOSIS — K219 Gastro-esophageal reflux disease without esophagitis: Secondary | ICD-10-CM

## 2020-10-08 DIAGNOSIS — E7800 Pure hypercholesterolemia, unspecified: Secondary | ICD-10-CM

## 2020-10-08 MED FILL — BIKTARVY 50-200-25MG TAB: 30 days supply | Qty: 30 | Fill #3

## 2020-10-09 MED FILL — OMEPRAZOLE 20MG CAP: 90 days supply | Qty: 90 | Fill #0

## 2020-10-09 MED FILL — ATORVASTATIN 40MG   TAB: 90 days supply | Qty: 90 | Fill #0

## 2020-10-10 MED FILL — SYNJARDY XR 5-1000MG TAB: 30 days supply | Qty: 60 | Fill #11

## 2020-10-17 ENCOUNTER — Ambulatory Visit
Admission: RE | Admit: 2020-10-17 | Discharge: 2020-10-17 | Disposition: A | Discharge status: Home / Self Care | Payer: Medicare HMO | Source: Ambulatory Visit | Attending: Infectious Diseases | Admitting: Infectious Diseases

## 2020-10-17 ENCOUNTER — Encounter: Payer: Self-pay | Admitting: Infectious Diseases

## 2020-10-17 DIAGNOSIS — R918 Other nonspecific abnormal finding of lung field: Secondary | ICD-10-CM | POA: Diagnosis not present

## 2020-10-17 DIAGNOSIS — J42 Unspecified chronic bronchitis: Secondary | ICD-10-CM

## 2020-10-17 DIAGNOSIS — R911 Solitary pulmonary nodule: Secondary | ICD-10-CM | POA: Insufficient documentation

## 2020-10-17 DIAGNOSIS — F17200 Nicotine dependence, unspecified, uncomplicated: Secondary | ICD-10-CM

## 2020-10-23 MED FILL — TERAZOSIN 2MG       CAP: 90 days supply | Qty: 90 | Fill #1

## 2020-10-26 ENCOUNTER — Other Ambulatory Visit: Payer: Self-pay | Admitting: Internal Medicine

## 2020-10-26 DIAGNOSIS — N5201 Erectile dysfunction due to arterial insufficiency: Secondary | ICD-10-CM

## 2020-10-26 DIAGNOSIS — Z79891 Long term (current) use of opiate analgesic: Secondary | ICD-10-CM

## 2020-10-28 DIAGNOSIS — E1165 Type 2 diabetes mellitus with hyperglycemia: Secondary | ICD-10-CM | POA: Diagnosis not present

## 2020-10-28 MED ORDER — HYDROCODONE-ACETAMINOPHEN 7.5-325 MG PO TABS: 1.0000 | ORAL_TABLET | Freq: Four times a day (QID) | ORAL | 0 refills | Status: DC | PRN

## 2020-10-28 MED ORDER — TADALAFIL 5 MG PO TABS: 5.0000 mg | ORAL_TABLET | Freq: Every day | ORAL | 0 refills | Status: DC | PRN

## 2020-10-28 MED FILL — TADALAFIL 5MG       TAB: 10 days supply | Qty: 10 | Fill #0

## 2020-10-28 MED FILL — HYDROCOD/ACETAM 7.5-325MG TAB: 15 days supply | Qty: 60 | Fill #0

## 2020-10-28 NOTE — Telephone Encounter (Signed)
norco last written 09/30/20 #60

## 2020-10-29 DIAGNOSIS — H60393 Other infective otitis externa, bilateral: Secondary | ICD-10-CM | POA: Diagnosis not present

## 2020-10-29 MED FILL — DOXYCYCLINE HYCLATE 50 MG CAP: 7 days supply | Qty: 14 | Fill #0

## 2020-11-04 ENCOUNTER — Other Ambulatory Visit: Payer: Self-pay | Admitting: Infectious Diseases

## 2020-11-04 ENCOUNTER — Other Ambulatory Visit: Payer: Self-pay | Admitting: Internal Medicine

## 2020-11-04 DIAGNOSIS — I10 Essential (primary) hypertension: Secondary | ICD-10-CM

## 2020-11-04 DIAGNOSIS — B20 Human immunodeficiency virus [HIV] disease: Secondary | ICD-10-CM

## 2020-11-05 MED FILL — CARVEDILOL 3.125MG  TAB: 90 days supply | Qty: 180 | Fill #0

## 2020-11-05 MED FILL — LOSARTAN/HCT 50-12.5MG TAB: 90 days supply | Qty: 90 | Fill #0

## 2020-11-06 DIAGNOSIS — H60393 Other infective otitis externa, bilateral: Secondary | ICD-10-CM | POA: Diagnosis not present

## 2020-11-06 MED FILL — NEOMYCIN-POLYMYXIN-HC EAR SUSP: 10 days supply | Qty: 10 | Fill #0

## 2020-11-06 MED FILL — PREDNISONE 20 MG TABLET: 7 days supply | Qty: 7 | Fill #0

## 2020-11-07 MED FILL — BIKTARVY 50-200-25MG TAB: 30 days supply | Qty: 30 | Fill #0

## 2020-11-09 MED FILL — BD NANO 2 GEN PEN NDL 32GX4MM: 90 days supply | Qty: 100 | Fill #1

## 2020-11-17 ENCOUNTER — Other Ambulatory Visit: Payer: Self-pay | Admitting: Internal Medicine

## 2020-11-17 DIAGNOSIS — E1169 Type 2 diabetes mellitus with other specified complication: Secondary | ICD-10-CM

## 2020-11-17 DIAGNOSIS — Z794 Long term (current) use of insulin: Secondary | ICD-10-CM

## 2020-11-18 DIAGNOSIS — J018 Other acute sinusitis: Secondary | ICD-10-CM | POA: Diagnosis not present

## 2020-11-18 MED FILL — SYNJARDY XR 5-1000MG TAB: 30 days supply | Qty: 60 | Fill #0

## 2020-11-18 MED FILL — AZITHROMYCIN 250 MG TABLET: 5 days supply | Qty: 6 | Fill #0

## 2020-11-18 NOTE — Telephone Encounter (Signed)
Next appt scheduled 01/24/21 with PCP.

## 2020-11-25 ENCOUNTER — Other Ambulatory Visit: Payer: Self-pay | Admitting: Internal Medicine

## 2020-11-25 DIAGNOSIS — Z79891 Long term (current) use of opiate analgesic: Secondary | ICD-10-CM

## 2020-11-25 MED ORDER — HYDROCODONE-ACETAMINOPHEN 7.5-325 MG PO TABS: 1.0000 | ORAL_TABLET | Freq: Four times a day (QID) | ORAL | 0 refills | Status: DC | PRN

## 2020-11-25 NOTE — Telephone Encounter (Signed)
Last rx written 10/28/20. Last OV 02/20/20. Next OV  01/21/21. UDS  11/28/19.

## 2020-11-27 IMAGING — CT CT CHEST LUNG CANCER SCREENING LOW DOSE W/O CM
2 of 5 series · 14 of 40 positions shown, 17 images · non-contrast
Comparison: Low-dose lung cancer screening chest CT 12/09/2016.

CLINICAL DATA: 64-year-old male current smoker with 44 pack-year
history of smoking. Lung cancer screening examination.

EXAM:
CT CHEST WITHOUT CONTRAST LOW-DOSE FOR LUNG CANCER SCREENING
TECHNIQUE: Multidetector CT imaging of the chest was performed following the
standard protocol without IV contrast.

[Series 4: lung 1.00 br44 cor · coronal · 0.61mm/px · 3 of 392 slices shown]
[im 79/392  lung]
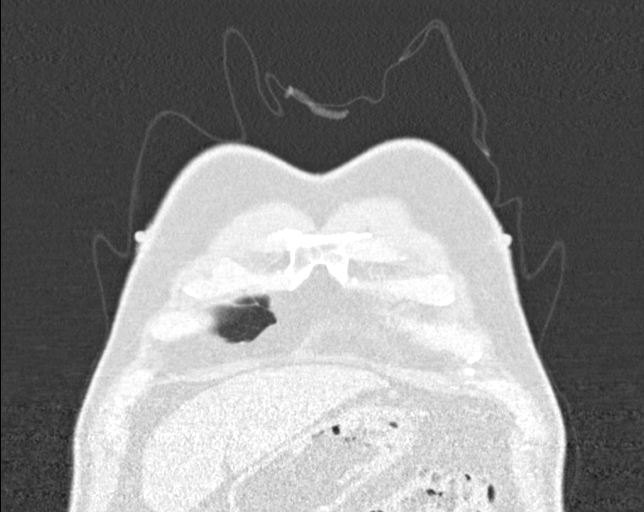
[im 157/392  lung]
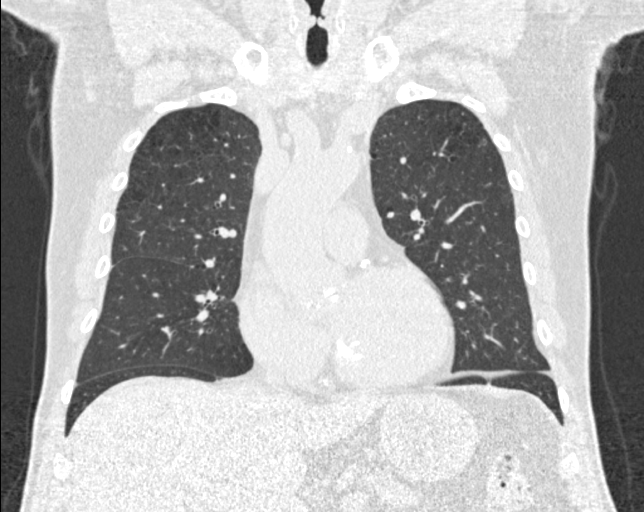
[im 235/392  lung]
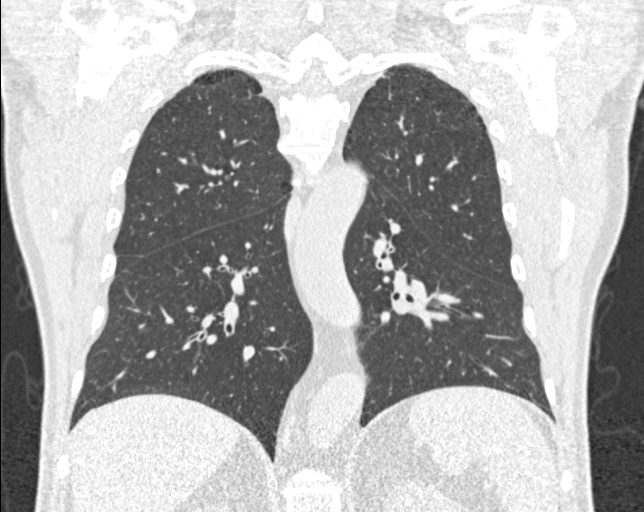

[Series 9: lung 1.00 br60 axial · axial · 0.77mm/px · z∈[-992,-709]mm · 11 of 313 slices shown, 14 images]
[im 15/313  mediastinal]
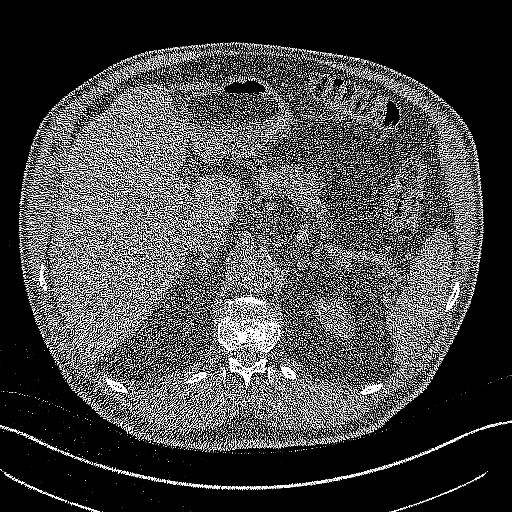
[im 15/313  lung]
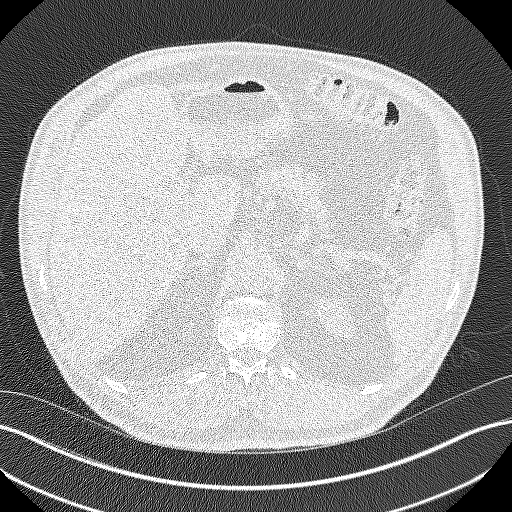
[im 45/313  lung]
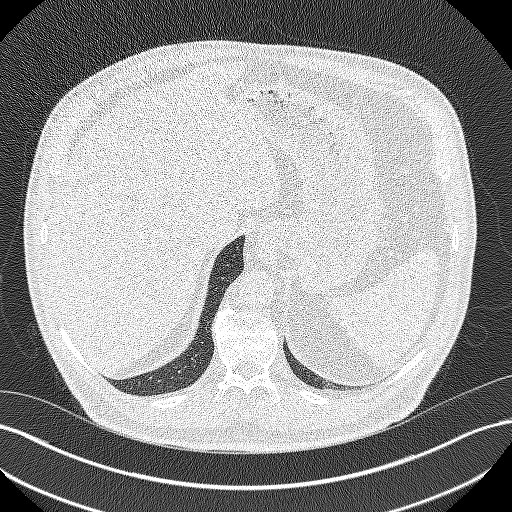
[im 75/313  lung]
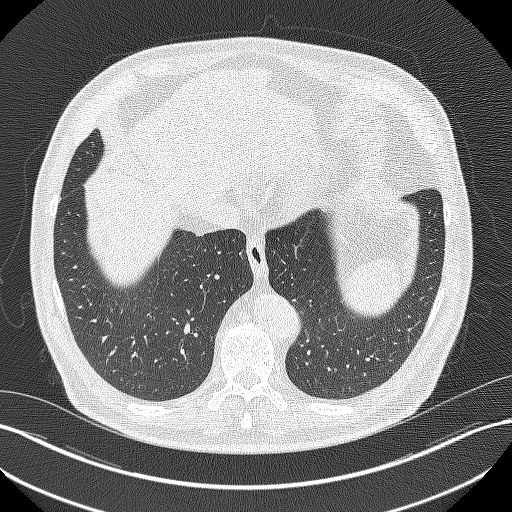
[im 105/313  lung]
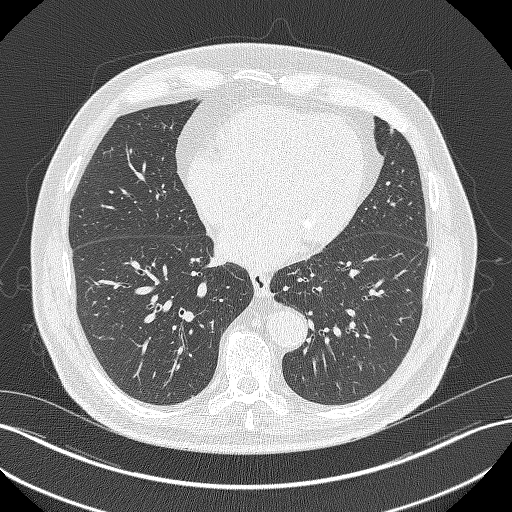
[im 134/313  mediastinal]
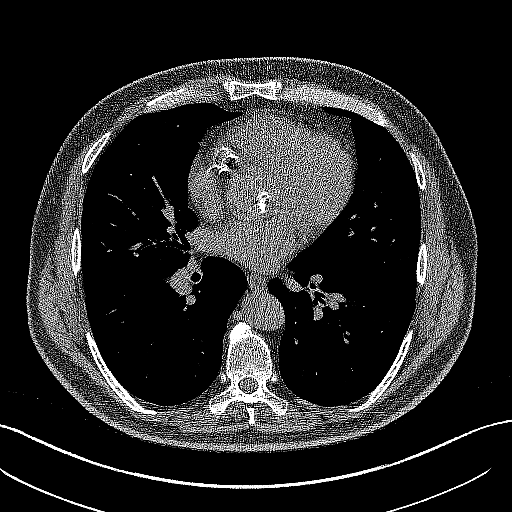
[im 134/313  lung]
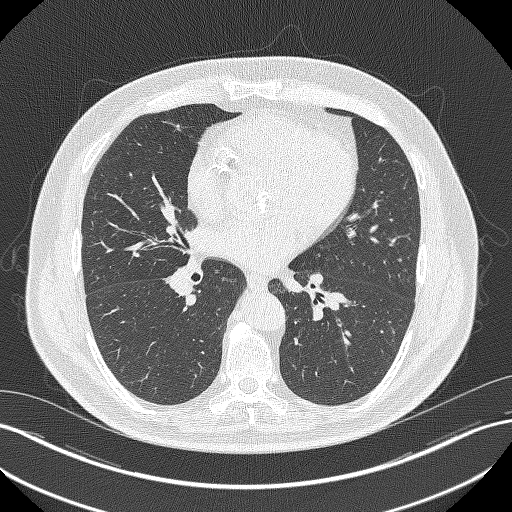
[im 164/313  lung]
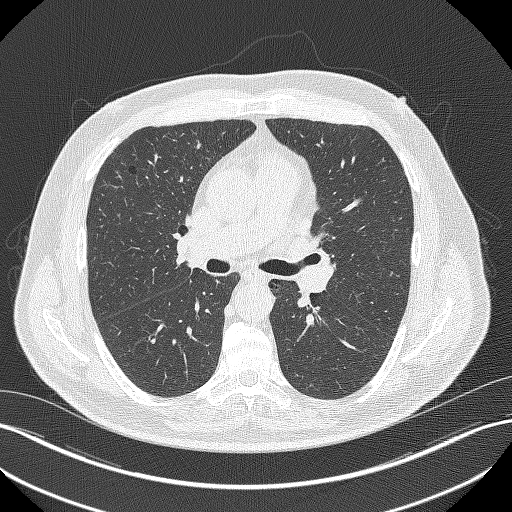
[im 179/313  lung]
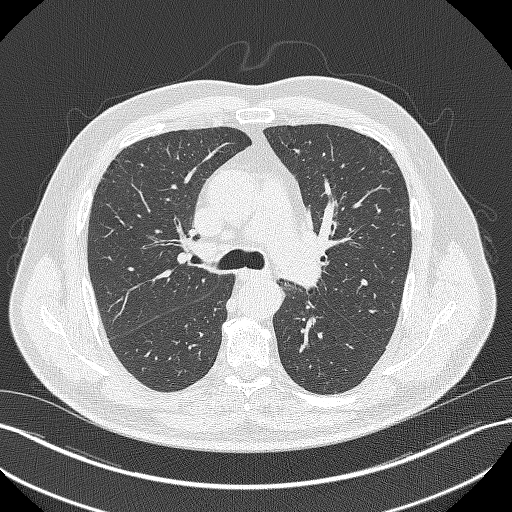
[im 209/313  lung]
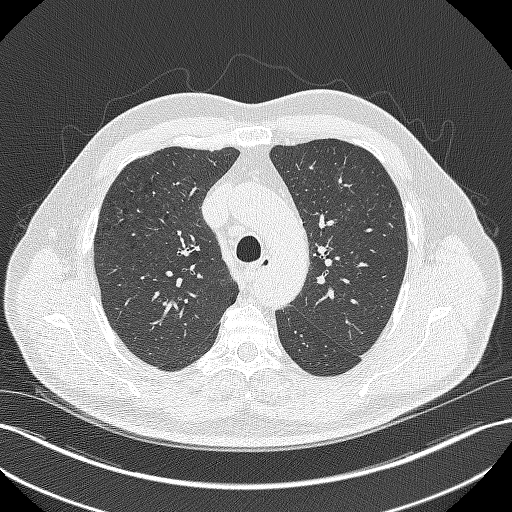
[im 238/313  mediastinal]
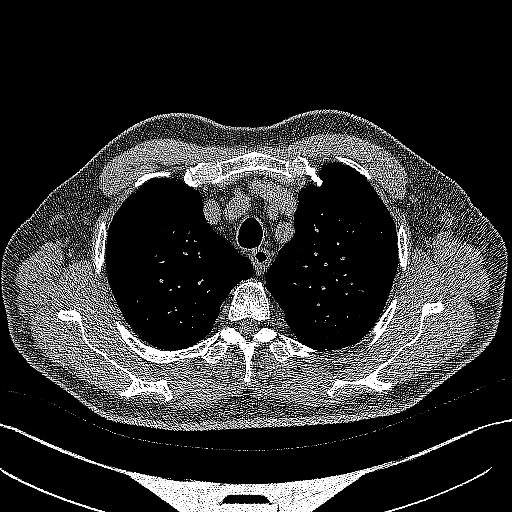
[im 238/313  lung]
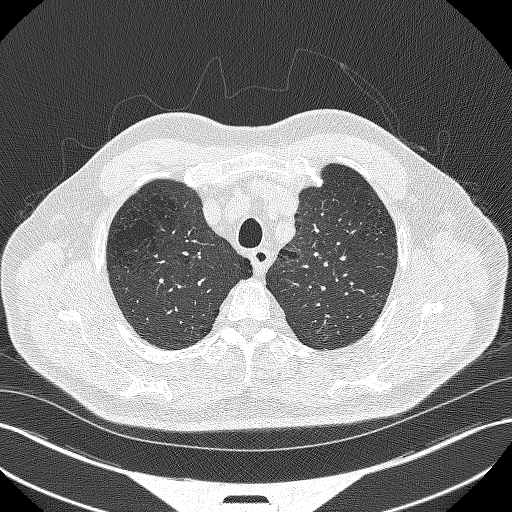
[im 268/313  lung]
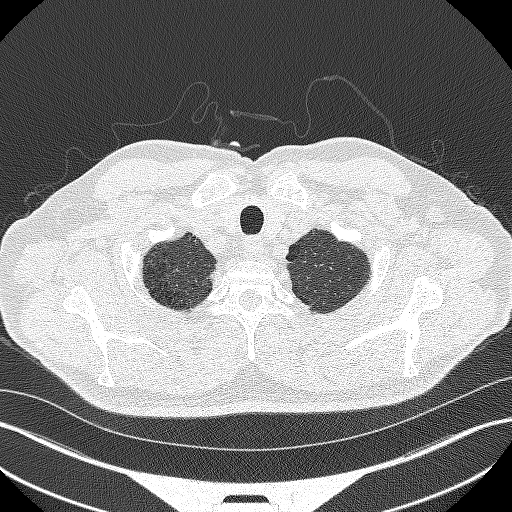
[im 298/313  lung]
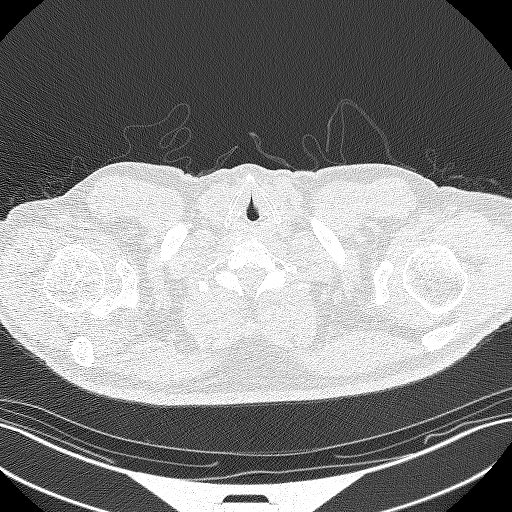

[14 of 40 positions shown; findings below may reference images not displayed]

FINDINGS: Cardiovascular: Heart size is normal. There is no significant
pericardial fluid, thickening or pericardial calcification. There is
aortic atherosclerosis, as well as atherosclerosis of the great
vessels of the mediastinum and the coronary arteries, including
calcified atherosclerotic plaque in the left anterior descending,
left circumflex and right coronary arteries. Calcifications of the
aortic valve and mitral annulus.

Mediastinum/Nodes: No pathologically enlarged mediastinal or hilar
lymph nodes. Please note that accurate exclusion of hilar adenopathy
is limited on noncontrast CT scans. Esophagus is unremarkable in
appearance. No axillary lymphadenopathy.

Lungs/Pleura: In addition to multiple small pulmonary nodules
scattered throughout the lungs bilaterally, which are generally
stable compared to the prior study, there is a new nodular area of
architectural distortion in the inferior segment of the lingula
(axial image 220 of series 3), with a volume derived mean diameter
of 10.4 mm, strongly favored to represent an area of post infectious
or inflammatory scarring. No acute consolidative airspace disease.
No pleural effusions. Diffuse bronchial wall thickening with mild to
moderate centrilobular and paraseptal emphysema.

Upper Abdomen: Aortic atherosclerosis.

Musculoskeletal: There are no aggressive appearing lytic or blastic
lesions noted in the visualized portions of the skeleton.
IMPRESSION: 1. New nodular area of architectural distortion in the inferior
segment of the lingula, favored to represent an area of benign post
infectious or inflammatory scarring. However, the possibility of a
developing neoplasm is difficult to entirely exclude, and this is
accordingly categorized as Lung-RADS 4BS, suspicious. Accordingly,
close attention on follow up low-dose chest CT without contrast in 3
months (please use the following order, "CT CHEST LCS NODULE
FOLLOW-UP W/O CM") is recommended.
2. The "S" modifier above refers to potentially clinically
significant non lung cancer related findings. Specifically, there is
aortic atherosclerosis, in addition to 3 vessel coronary artery
disease. Please note that although the presence of coronary artery
calcium documents the presence of coronary artery disease, the
severity of this disease and any potential stenosis cannot be
assessed on this non-gated CT examination. Assessment for potential
risk factor modification, dietary therapy or pharmacologic therapy
may be warranted, if clinically indicated.
3. Mild diffuse bronchial wall thickening with mild to moderate
centrilobular and paraseptal emphysema; imaging findings suggestive
of underlying COPD.
4. There are calcifications of the aortic valve. Echocardiographic
correlation for evaluation of potential valvular dysfunction may be
warranted if clinically indicated.

These results will be called to the ordering clinician or
representative by the Radiologist Assistant, and communication
documented in the PACS or [REDACTED].

Aortic Atherosclerosis (4FW58-ICO.O) and Emphysema (4FW58-RKW.Y).

## 2020-12-22 ENCOUNTER — Other Ambulatory Visit: Payer: Self-pay | Admitting: Internal Medicine

## 2020-12-22 DIAGNOSIS — Z794 Long term (current) use of insulin: Secondary | ICD-10-CM

## 2020-12-22 DIAGNOSIS — E1169 Type 2 diabetes mellitus with other specified complication: Secondary | ICD-10-CM

## 2020-12-26 ENCOUNTER — Other Ambulatory Visit: Payer: Self-pay | Admitting: Internal Medicine

## 2020-12-26 DIAGNOSIS — N5201 Erectile dysfunction due to arterial insufficiency: Secondary | ICD-10-CM

## 2020-12-26 DIAGNOSIS — Z79891 Long term (current) use of opiate analgesic: Secondary | ICD-10-CM

## 2020-12-26 MED ORDER — TADALAFIL 5 MG PO TABS: 5.0000 mg | ORAL_TABLET | Freq: Every day | ORAL | 0 refills | Status: DC | PRN

## 2020-12-26 MED ORDER — HYDROCODONE-ACETAMINOPHEN 7.5-325 MG PO TABS
1.0000 | ORAL_TABLET | Freq: Four times a day (QID) | ORAL | 0 refills | Status: DC | PRN
Start: 2020-12-26 — End: 2021-02-03

## 2020-12-26 NOTE — Telephone Encounter (Signed)
Last office visit: 02/20/2020 Last UDS: 11/28/2019 Last Refill: last written 11/25/20 Next appt: 01/21/2021 w/pcp

## 2021-01-20 ENCOUNTER — Other Ambulatory Visit: Payer: Self-pay | Admitting: Internal Medicine

## 2021-01-20 ENCOUNTER — Other Ambulatory Visit: Payer: Self-pay

## 2021-01-20 DIAGNOSIS — K219 Gastro-esophageal reflux disease without esophagitis: Secondary | ICD-10-CM

## 2021-01-20 DIAGNOSIS — I10 Essential (primary) hypertension: Secondary | ICD-10-CM

## 2021-01-20 DIAGNOSIS — E7800 Pure hypercholesterolemia, unspecified: Secondary | ICD-10-CM

## 2021-01-20 MED ORDER — CLOPIDOGREL BISULFATE 75 MG PO TABS: 1.0000 | ORAL_TABLET | Freq: Every day | ORAL | 1 refills | Status: DC

## 2021-01-20 NOTE — Telephone Encounter (Signed)
Next appt scheduled  Tomorrow with PCP. 

## 2021-01-21 ENCOUNTER — Ambulatory Visit (INDEPENDENT_AMBULATORY_CARE_PROVIDER_SITE_OTHER): Payer: Medicare HMO | Admitting: Internal Medicine

## 2021-01-21 ENCOUNTER — Encounter: Payer: Self-pay | Admitting: Internal Medicine

## 2021-01-21 VITALS — BP 161/80 | HR 89 | Temp 98.2°F | Ht 70.0 in | Wt 195.1 lb

## 2021-01-21 DIAGNOSIS — Z79891 Long term (current) use of opiate analgesic: Secondary | ICD-10-CM

## 2021-01-21 DIAGNOSIS — B20 Human immunodeficiency virus [HIV] disease: Secondary | ICD-10-CM

## 2021-01-21 DIAGNOSIS — E1169 Type 2 diabetes mellitus with other specified complication: Secondary | ICD-10-CM

## 2021-01-21 DIAGNOSIS — Z0000 Encounter for general adult medical examination without abnormal findings: Secondary | ICD-10-CM

## 2021-01-21 DIAGNOSIS — R911 Solitary pulmonary nodule: Secondary | ICD-10-CM

## 2021-01-21 DIAGNOSIS — I10 Essential (primary) hypertension: Secondary | ICD-10-CM

## 2021-01-21 DIAGNOSIS — E7800 Pure hypercholesterolemia, unspecified: Secondary | ICD-10-CM

## 2021-01-21 DIAGNOSIS — E11618 Type 2 diabetes mellitus with other diabetic arthropathy: Secondary | ICD-10-CM

## 2021-01-21 DIAGNOSIS — J42 Unspecified chronic bronchitis: Secondary | ICD-10-CM | POA: Diagnosis not present

## 2021-01-21 DIAGNOSIS — I2510 Atherosclerotic heart disease of native coronary artery without angina pectoris: Secondary | ICD-10-CM

## 2021-01-21 DIAGNOSIS — Z794 Long term (current) use of insulin: Secondary | ICD-10-CM

## 2021-01-21 DIAGNOSIS — E8729 Other acidosis: Secondary | ICD-10-CM

## 2021-01-21 DIAGNOSIS — M069 Rheumatoid arthritis, unspecified: Secondary | ICD-10-CM

## 2021-01-21 DIAGNOSIS — E872 Acidosis: Secondary | ICD-10-CM

## 2021-01-21 DIAGNOSIS — F32A Depression, unspecified: Secondary | ICD-10-CM

## 2021-01-21 DIAGNOSIS — F17200 Nicotine dependence, unspecified, uncomplicated: Secondary | ICD-10-CM

## 2021-01-21 LAB — GLUCOSE, CAPILLARY: Glucose-Capillary: 172 mg/dL — ABNORMAL HIGH (ref 70–99)

## 2021-01-21 LAB — POCT GLYCOSYLATED HEMOGLOBIN (HGB A1C): Hemoglobin A1C: 8.2 % — AB (ref 4.0–5.6)

## 2021-01-21 MED ORDER — OZEMPIC (1 MG/DOSE) 2 MG/1.5ML ~~LOC~~ SOPN: 2.0000 mg | PEN_INJECTOR | SUBCUTANEOUS | 3 refills | Status: DC

## 2021-01-21 NOTE — Assessment & Plan Note (Signed)
-   This problem is chronic and uncontrolled -Patient complains of intermittent pain in the small joints of his fingers as well as his hip and back -I had referred him to rheumatology on his last visit but he opted to follow-up with his old rheumatologist.  However, patient states that he is unable to obtain appointment with them. -We will refer patient to rheumatology again today -We will continue with Voltaren gel as well as Norco for pain control. -No further work-up at this time

## 2021-01-21 NOTE — Assessment & Plan Note (Signed)
-   This problem is chronic and stable -He follows up with Dr. Johnnye Sima for this -His last blood test showed undetectable levels of HIV -We will continue [place] for now

## 2021-01-21 NOTE — Assessment & Plan Note (Signed)
-   This problem is chronic and stable -The benefits of prescribing Norco currently outweighs the risks given that this helps him do his daily activities and continue his job -We will check UDS today -No further work-up at this time

## 2021-01-21 NOTE — Patient Instructions (Signed)
-   It was a pleasure seeing you today -Please get your COVID booster -Your A1c is improving.  Keep up the good work!  We will increase your Ozempic to 2 mg a week to see if we can improve your blood sugars -We will refer you to rheumatology for follow-up for your RA -We will check some blood work on you today and a urine test -Please call me if have any questions or concerns -If her blood pressure remains elevated at her next visit we will increase your losartan to 100 mg

## 2021-01-21 NOTE — Progress Notes (Signed)
   Subjective:    Patient ID: Riley Jones, male    DOB: [birth year], 65 y.o.   MRN: [id number]  HPI  I have seen and examined this patient.  Patient is here for routine follow-up of his diabetes and hypertension.  Patient states that he feels well but does complain of occasional pain in his hands and his hip as well as lower back.  States that he has had intermittent ear infections and no signs of infection recently for which [facility]  He denies any other complaints at this time.  He states that he is compliant with all his medications.  Review of Systems  Constitutional: Negative.   HENT: Negative.   Respiratory: Negative.   Cardiovascular: Negative.   Gastrointestinal: Negative.   Musculoskeletal: Positive for arthralgias and back pain.  Neurological: Negative.   Psychiatric/Behavioral: Negative.        Objective:   Physical Exam Constitutional:      Appearance: Normal appearance.  HENT:     Head: Normocephalic and atraumatic.  Cardiovascular:     Rate and Rhythm: Normal rate and regular rhythm.     Heart sounds: Normal heart sounds.  Pulmonary:     Effort: Pulmonary effort is normal. No respiratory distress.     Breath sounds: Normal breath sounds. No wheezing.  Abdominal:     General: Bowel sounds are normal. There is no distension.     Palpations: Abdomen is soft.     Tenderness: There is no abdominal tenderness.  Musculoskeletal:        General: No swelling or tenderness.     Cervical back: Neck supple. No rigidity.  Neurological:     General: No focal deficit present.     Mental Status: He is alert and oriented to person, place, and time.  Psychiatric:        Mood and Affect: Mood normal.        Behavior: Behavior normal.           Assessment & Plan:  Please see problem based charting for assessment and plan:

## 2021-01-21 NOTE — Assessment & Plan Note (Signed)
-   This problem is chronic and stable -We will continue with Lipitor 40 mg daily for now -LDL was 74 and HDL was 41 on his last check in September -No further work-up at this time

## 2021-01-21 NOTE — Assessment & Plan Note (Signed)
-   This problem is chronic and stable -Patient follows with cardiology as an outpatient for this -Patient denies any chest pain or shortness of breath at this time.  He states that he did have some intermittent chest pain but he attributes this to heartburn and this pain is relieved with Tums -We will continue with aspirin, Plavix, Lipitor 40 mg daily as well as carvedilol 3.125 mg daily -No further work-up at this time

## 2021-01-21 NOTE — Assessment & Plan Note (Addendum)
-  Patient continues to smoke (1 pack/day) and smoking cessation recommended again at this time -Patient was unable to tolerate nicotine patch as he broke out into a rash.  He states that he tried Wellbutrin and Chantix in the past with no effect -He will enroll in a smoking program at work -No further work-up at this time

## 2021-01-21 NOTE — Assessment & Plan Note (Signed)
-   Patient advised to get a fourth dose of COVID given his age and comorbidities

## 2021-01-21 NOTE — Assessment & Plan Note (Signed)
BP Readings from Last 3 Encounters:  01/21/21 (!) 161/80  08/20/20 122/68  07/02/20 (!) 145/77    Lab Results  Component Value Date   NA 138 06/11/2020   K 4.6 06/11/2020   CREATININE 0.93 06/11/2020    Assessment: Blood pressure control:  Fair Progress toward BP goal:   Unchanged Comments: Patient's repeat systolic blood pressure was in the 130s.  We will continue with losartan/HCTZ 50/12.5 mg daily, prazosin 2 mg daily as well as carvedilol 3.125 mg twice daily.  We will consider increasing his carvedilol if his blood pressure remains elevated at his next visit  Plan: Medications:  continue current medications Educational resources provided:   Self management tools provided:   Other plans: We will check BMP today

## 2021-01-21 NOTE — Assessment & Plan Note (Signed)
-   This problem is chronic and stable -Patient's PHQ-9 score is 6 today -Patient appears to be doing well off medications.  We will continue to monitor him closely -No further work-up at this time

## 2021-01-21 NOTE — Assessment & Plan Note (Signed)
-   This problem is chronic and stable -Patient states that he needs to use his albuterol inhaler only occasionally (once every 2 to 3 weeks) -Patient continues to smoke and smoking cessation recommended again at this time -Patient was unable to tolerate nicotine patch as he broke out into a rash.  He states that he tried Wellbutrin and Chantix in the past with no effect -He will enroll in a smoking program at work -No further work-up at this time

## 2021-01-21 NOTE — Assessment & Plan Note (Signed)
-   Patient had received CT chest showed benign lung nodule -Patient will need to continue with annual screening with low-dose CT chest (due in January 2023)

## 2021-01-22 ENCOUNTER — Telehealth: Payer: Self-pay

## 2021-01-22 ENCOUNTER — Encounter: Payer: Self-pay | Admitting: Internal Medicine

## 2021-01-22 DIAGNOSIS — E1165 Type 2 diabetes mellitus with hyperglycemia: Secondary | ICD-10-CM | POA: Diagnosis not present

## 2021-01-22 DIAGNOSIS — E8729 Other acidosis: Secondary | ICD-10-CM | POA: Insufficient documentation

## 2021-01-22 DIAGNOSIS — E872 Acidosis: Secondary | ICD-10-CM | POA: Insufficient documentation

## 2021-01-22 HISTORY — DX: Other acidosis: E87.29

## 2021-01-22 LAB — BMP8+ANION GAP
Anion Gap: 19 mmol/L — ABNORMAL HIGH (ref 10.0–18.0)
BUN/Creatinine Ratio: 18 (ref 10–24)
BUN: 17 mg/dL (ref 8–27)
CO2: 19 mmol/L — ABNORMAL LOW (ref 20–29)
Calcium: 10.1 mg/dL (ref 8.6–10.2)
Chloride: 98 mmol/L (ref 96–106)
Creatinine, Ser: 0.95 mg/dL (ref 0.76–1.27)
Glucose: 121 mg/dL — ABNORMAL HIGH (ref 65–99)
Potassium: 4.3 mmol/L (ref 3.5–5.2)
Sodium: 136 mmol/L (ref 134–144)
eGFR: 88 mL/min/{1.73_m2} (ref >59)

## 2021-01-22 MED ORDER — OZEMPIC (1 MG/DOSE) 2 MG/1.5ML ~~LOC~~ SOPN: 1.0000 mg | PEN_INJECTOR | SUBCUTANEOUS | 3 refills | Status: DC

## 2021-01-22 NOTE — Addendum Note (Signed)
Addended by: Aldine Contes on: 01/22/2021 12:23 PM   Modules accepted: Orders

## 2021-01-22 NOTE — Telephone Encounter (Signed)
I sent him a message but can you call him as well. He can remain on the 1mg /week dose of ozempic for now. We will change him to 2 mg at his follow up

## 2021-01-22 NOTE — Telephone Encounter (Signed)
Received following faxed message from [facility]:  "Please verify directions.  Patient was on 1mg  per week.  Pen is only set up to deliver 1mg  doses.  Would have to inject twice to do 2mg ".   (Dr. Dareen Piano, I think there is a 2mg  pen available as well). Thanks, [employer]

## 2021-01-22 NOTE — Telephone Encounter (Signed)
I don't see a 2 mg pen. Can you ask the pharmacy if there is a 2 mg pen? Ozempic has now been approved for 2 mg/week dosing

## 2021-01-22 NOTE — Addendum Note (Signed)
Addended by: Aldine Contes on: 01/22/2021 06:23 PM   Modules accepted: Orders

## 2021-01-22 NOTE — Telephone Encounter (Signed)
Thanks Rachelle!  Dr. Dareen Piano, I was able to get in touch with the pharmacist at [facility] about the 2mg  Ozempic pen.  She states the 2mg  pen was just recently loaded into their system, but when she tried to order it she is getting a message it is still unavailable. Looks like this will be an option for our patients soon.  For now, if you would like him to have the 2mg  dose weekly, he will need to inject twice.  Please resend the RX to reflect the new Sig. Thank you, Rosevelt Luu

## 2021-01-22 NOTE — Assessment & Plan Note (Signed)
-   Patient noted to have a mild increased anion gap metabolic acidosis on his BMP -Patient is on an SLGT 2 inhibitor and there is concern for possible euglycemic DKA -However, patient remains asymptomatic and I am uncertain if this is truly accurate -We will have patient follow-up for a repeat BMP either later this week or early next week to see if we need to discontinue his SGLT2 inhibitor

## 2021-01-22 NOTE — Assessment & Plan Note (Addendum)
Lab Results  Component Value Date   HGBA1C 8.2 (A) 01/21/2021   HGBA1C 8.8 (A) 11/28/2019   HGBA1C 10.0 (A) 08/08/2019     Assessment: Diabetes control:  Fair Progress toward A1C goal:   Improved Comments: Patient is compliant with Synjardy XR 01/999 mg twice daily, Ozempic 1 mg weekly and glargine insulin 22 units daily.  Plan: Medications:  We will increase Ozempic to 2 mg weekly Home glucose monitoring: Frequency:   Timing:   Instruction/counseling given: reminded to bring blood glucose meter & log to each visit Educational resources provided:   Self management tools provided:   Other plans: We will check BMP  Addendum: -Patient noted to have a mild anion gap acidosis with a slightly decreased bicarb of 19.  There is concern for possible euglycemic DKA but patient remains asymptomatic and I am unsure if this is truly accurate. -We will have patient come in for a repeat BMP either later this week or early next week to see if we need to discontinue his SGLT2 inhibitor -Results were discussed with patient who is in agreement with plan

## 2021-01-23 NOTE — Telephone Encounter (Signed)
TC to patient, VM obtained, detailed message left with Dr. Wilber Bihari instructions to stay on the 1mg /week dose of Ozempic for now.   SChaplin, RN,BSN

## 2021-01-24 LAB — TOXASSURE SELECT,+ANTIDEPR,UR

## 2021-01-29 ENCOUNTER — Other Ambulatory Visit (INDEPENDENT_AMBULATORY_CARE_PROVIDER_SITE_OTHER): Payer: Medicare HMO

## 2021-01-29 DIAGNOSIS — E872 Acidosis: Secondary | ICD-10-CM

## 2021-01-29 DIAGNOSIS — Z794 Long term (current) use of insulin: Secondary | ICD-10-CM

## 2021-01-29 DIAGNOSIS — E1169 Type 2 diabetes mellitus with other specified complication: Secondary | ICD-10-CM | POA: Diagnosis not present

## 2021-01-29 DIAGNOSIS — E8729 Other acidosis: Secondary | ICD-10-CM

## 2021-01-30 LAB — URINALYSIS, COMPLETE
Bilirubin, UA: NEGATIVE
Ketones, UA: NEGATIVE
Leukocytes,UA: NEGATIVE
Nitrite, UA: NEGATIVE
Protein,UA: NEGATIVE
RBC, UA: NEGATIVE
Specific Gravity, UA: 1.024 (ref 1.005–1.030)
Urobilinogen, Ur: 0.2 mg/dL (ref 0.2–1.0)
pH, UA: 5.5 (ref 5.0–7.5)

## 2021-01-30 LAB — MICROSCOPIC EXAMINATION
Bacteria, UA: NONE SEEN
Casts: NONE SEEN /lpf
Epithelial Cells (non renal): NONE SEEN /hpf (ref 0–10)
RBC, Urine: NONE SEEN /hpf (ref 0–2)
WBC, UA: NONE SEEN /hpf (ref 0–5)

## 2021-01-31 ENCOUNTER — Encounter: Payer: Self-pay | Admitting: Internal Medicine

## 2021-01-31 ENCOUNTER — Other Ambulatory Visit: Payer: Self-pay | Admitting: Internal Medicine

## 2021-02-03 ENCOUNTER — Other Ambulatory Visit: Payer: Self-pay | Admitting: Internal Medicine

## 2021-02-03 DIAGNOSIS — Z79891 Long term (current) use of opiate analgesic: Secondary | ICD-10-CM

## 2021-02-03 DIAGNOSIS — N5201 Erectile dysfunction due to arterial insufficiency: Secondary | ICD-10-CM

## 2021-02-03 LAB — BMP8+ANION GAP
Anion Gap: 15 mmol/L (ref 10.0–18.0)
BUN/Creatinine Ratio: 19 (ref 10–24)
BUN: 17 mg/dL (ref 8–27)
CO2: 22 mmol/L (ref 20–29)
Calcium: 9.6 mg/dL (ref 8.6–10.2)
Chloride: 98 mmol/L (ref 96–106)
Creatinine, Ser: 0.9 mg/dL (ref 0.76–1.27)
Glucose: 154 mg/dL — ABNORMAL HIGH (ref 65–99)
Potassium: 4.5 mmol/L (ref 3.5–5.2)
Sodium: 135 mmol/L (ref 134–144)
eGFR: 94 mL/min/{1.73_m2} (ref >59)

## 2021-02-03 LAB — BETA-HYDROXYBUTYRIC ACID: Beta-Hydroxybutyrate: 0.7 mg/dL

## 2021-02-03 MED ORDER — TADALAFIL 5 MG PO TABS: 5.0000 mg | ORAL_TABLET | Freq: Every day | ORAL | 0 refills | Status: DC | PRN

## 2021-02-03 NOTE — Telephone Encounter (Signed)
Approved Cialis refill request. Last filled by his PCP 1 month ago.

## 2021-02-04 MED ORDER — HYDROCODONE-ACETAMINOPHEN 7.5-325 MG PO TABS: 1.0000 | ORAL_TABLET | Freq: Four times a day (QID) | ORAL | 0 refills | Status: DC | PRN

## 2021-02-04 NOTE — Telephone Encounter (Signed)
Last office visit: 01/21/21 Last UDS: 01/21/21 Last Refill: 12/26/2020 #60 (confirmed with pharmacy)

## 2021-02-04 NOTE — Telephone Encounter (Signed)
Reviewed chart, database, and last Utox which are all appropriate. Refill request for his Norco approved.

## 2021-02-07 ENCOUNTER — Other Ambulatory Visit: Payer: Self-pay | Admitting: Internal Medicine

## 2021-02-07 DIAGNOSIS — I10 Essential (primary) hypertension: Secondary | ICD-10-CM

## 2021-02-24 ENCOUNTER — Other Ambulatory Visit: Payer: Self-pay | Admitting: Infectious Diseases

## 2021-02-24 DIAGNOSIS — B20 Human immunodeficiency virus [HIV] disease: Secondary | ICD-10-CM

## 2021-03-01 ENCOUNTER — Other Ambulatory Visit: Payer: Self-pay | Admitting: Internal Medicine

## 2021-03-01 DIAGNOSIS — N5201 Erectile dysfunction due to arterial insufficiency: Secondary | ICD-10-CM

## 2021-03-01 DIAGNOSIS — Z79891 Long term (current) use of opiate analgesic: Secondary | ICD-10-CM

## 2021-03-05 MED ORDER — TADALAFIL 5 MG PO TABS: 5.0000 mg | ORAL_TABLET | Freq: Every day | ORAL | 0 refills | Status: DC | PRN

## 2021-03-05 MED ORDER — HYDROCODONE-ACETAMINOPHEN 7.5-325 MG PO TABS: 1.0000 | ORAL_TABLET | Freq: Four times a day (QID) | ORAL | 0 refills | Status: DC | PRN

## 2021-03-06 DIAGNOSIS — M79641 Pain in right hand: Secondary | ICD-10-CM | POA: Diagnosis not present

## 2021-03-06 DIAGNOSIS — M545 Low back pain, unspecified: Secondary | ICD-10-CM | POA: Diagnosis not present

## 2021-03-06 DIAGNOSIS — G8929 Other chronic pain: Secondary | ICD-10-CM | POA: Diagnosis not present

## 2021-03-06 DIAGNOSIS — M7062 Trochanteric bursitis, left hip: Secondary | ICD-10-CM | POA: Diagnosis not present

## 2021-03-06 DIAGNOSIS — M159 Polyosteoarthritis, unspecified: Secondary | ICD-10-CM | POA: Diagnosis not present

## 2021-03-06 DIAGNOSIS — M79642 Pain in left hand: Secondary | ICD-10-CM | POA: Diagnosis not present

## 2021-03-27 ENCOUNTER — Other Ambulatory Visit: Payer: Self-pay | Admitting: Internal Medicine

## 2021-03-27 DIAGNOSIS — Z79891 Long term (current) use of opiate analgesic: Secondary | ICD-10-CM

## 2021-03-27 DIAGNOSIS — N5201 Erectile dysfunction due to arterial insufficiency: Secondary | ICD-10-CM

## 2021-04-01 ENCOUNTER — Encounter: Payer: Self-pay | Admitting: *Deleted

## 2021-04-01 MED ORDER — HYDROCODONE-ACETAMINOPHEN 7.5-325 MG PO TABS: 1.0000 | ORAL_TABLET | Freq: Four times a day (QID) | ORAL | 0 refills | Status: DC | PRN

## 2021-04-01 NOTE — Telephone Encounter (Signed)
Last office visit: 01/21/2021 Last UDS: 01/21/2021 Last Written: 03/05/2021 #60 Next appt: none scheduled at this time

## 2021-04-06 NOTE — Progress Notes (Signed)
CARDIOLOGY OFFICE NOTE  Date:  04/15/2021    Riley Jones Date of Birth: November 25, 1955 Medical Record #[id number]  PCP:  Aldine Contes, MD  Cardiologist:  Gillian Shields    No chief complaint on file.   History of Present Illness: Riley Jones is a 65 y.o. male has a history of known CAD with LAD stent in 2015, ongoing tobacco use, DM-2, HIV, RA, HLD, HTN and chronic pain syndrome. Last ischemic evaluation 02/28/17 normal myovue no ischemia EF 53% He continues to smoke had rash with nicotine patch and Chantix not helpful. Lung cancer CT 10/17/20 benign just emphysema given 44 pack year smoking history DM poorly controlled with A1c over 8 Activity limited by RA with hand, back, hip pain on Norco    Father [place] and his land is very valuable Patient and his sister could inherit millions  Discussed need for updated stress test given CAD with ongoing risk factors New nitro also needed   Past Medical History:  Diagnosis Date   Anxiety    Arthritis    "pretty much all over"   Asthma    "mild" (07/17/2014)   Atypical angina ([name])    Myoview 2003, Normal EF 64%   CAD (coronary artery disease)    a. 10/20 DES to 80% mid LAD   COPD (chronic obstructive pulmonary disease) ([place])    "mild" (07/17/2014)   Depression    Foot pain 03/30/2011   GERD (gastroesophageal reflux disease)    H/O hiatal hernia    Headache    "weekly" (06/2014)   History of gout    HIV (human immunodeficiency virus infection) ([place]) 1995   DX after shingles followed by Dr. Ola Spurr (ID)   Hx of cardiovascular stress test    ETT-Myoview (3/16):  Ex 10:15, No Ischemia, EF 57%; Normal Study   Hyperlipidemia    Hypertension    Well Controlled off meds   Kidney stones    "passed them all"   Pneumonia 4-5 times   Rheumatoid arthritis(714.0)    Followed by Dr. Charlestine Night, off MTX and prdnisone since 2007   Shingles (herpes zoster) polyneuropathy 11/08/2018   Sleep apnea    does not wear  mask (07/17/2014)   Type II diabetes mellitus ([name])     Past Surgical History:  Procedure Laterality Date   APPENDECTOMY  ~ [place] Left 1990's   CORONARY ANGIOPLASTY WITH STENT PLACEMENT  07/17/2014   a. DES to 80% mid LAD   [place] / UMBILICAL / VENTRAL HERNIA REPAIR  1990's   "it was a double; not inguinal"   LEFT HEART CATHETERIZATION WITH CORONARY ANGIOGRAM N/A 07/17/2014   Procedure: LEFT HEART CATHETERIZATION WITH CORONARY ANGIOGRAM;  Surgeon: [name], MD;  [facility] C[facility] LAB;  Service: Cardiovascular;  Laterality: N/A;   PERCUTANEOUS STENT INTERVENTION  07/17/2014   Procedure: PERCUTANEOUS STENT INTERVENTION;  Surgeon: [name], MD;  Location: [facility] CATH LAB;  Service: Cardiovascular;;   TONSILLECTOMY AND ADENOIDECTOMY  1990's   TRIGGER FINGER RELEASE Left 1990's   2nd & 5th digits   UMBILICAL HERNIA REPAIR  1980's   w/mesh   UVULOPALATOPHARYNGOPLASTY (UPPP)/TONSILLECTOMY/SEPTOPLASTY  1990's     Medications: Current Meds  Medication Sig   albuterol (VENTOLIN HFA) 108 (90 Base) MCG/ACT inhaler INHALE 1 PUFF BY MOUTH EVERY 4 HOURS AS NEEDED FOR SHORTNESS OF BREATH OR MAY USE EVERY 6 HOURS AS  NEEDED   aspirin 81 MG EC tablet Take 81 mg by mouth daily.     atorvastatin (LIPITOR) 40 MG tablet Take 1 tablet (40 mg total) by mouth daily.   BD PEN NEEDLE NANO 2ND GEN 32G X 4 MM MISC USE UP TO 8 PER WEEK   betamethasone dipropionate 0.05 % cream Apply topically 2 (two) times daily.   BIKTARVY 50-200-25 MG TABS tablet Take 1 tablet by mouth once daily   carvedilol (COREG) 3.125 MG tablet TAKE 1 TABLET BY MOUTH TWICE DAILY WITH A MEAL   clopidogrel (PLAVIX) 75 MG tablet Take 1 tablet (75 mg total) by mouth daily.   fluticasone (FLONASE) 50 MCG/ACT nasal spray Place 1 spray into both nostrils daily.   HYDROcodone-acetaminophen (NORCO) 7.5-325 MG tablet Take 1 tablet by mouth every 6 (six) hours as needed for  moderate pain.   Insulin Glargine (BASAGLAR KWIKPEN) 100 UNIT/ML Inject 20 Units into the skin daily.   loratadine (CLARITIN) 10 MG tablet Take 1 tablet (10 mg total) by mouth daily.   losartan-hydrochlorothiazide (HYZAAR) 50-12.5 MG tablet Take 1 tablet by mouth once daily   Multiple Vitamins-Minerals (CENTRUM ADULTS PO) Take 1 tablet by mouth daily.   nitroGLYCERIN (NITROSTAT) 0.4 MG SL tablet PLACE 1 TABLET UNDER THE TONGUE EVERY 5 MINUTES AS NEEDED FOR CHEST PAIN   nystatin (NYSTATIN) powder Apply 1 application topically 2 (two) times daily.   omeprazole (PRILOSEC) 20 MG capsule TAKE 1 CAPSULE BY MOUTH AS NEEDED   Semaglutide, 1 MG/DOSE, (OZEMPIC, 1 MG/DOSE,) 2 MG/1.5ML SOPN Inject 1 mg into the skin once a week.   SYNJARDY XR 01-999 MG TB24 TAKE 1  TABLET BY MOUTH TWICE DAILY WITH A MEAL   tadalafil (CIALIS) 5 MG tablet TAKE 1 TABLET BY MOUTH ONCE DAILY AS NEEDED AS DIRECTED FOR ERECTILE DYSFUNCTION   terazosin (HYTRIN) 2 MG capsule Take 1 capsule by mouth at bedtime     Allergies: Allergies  Allergen Reactions   Penicillins Hives, Nausea Only and Rash    Has patient had a PCN reaction causing immediate rash, facial/tongue/throat swelling, SOB or lightheadedness with hypotension: Yes Has patient had a PCN reaction causing severe rash involving mucus membranes or skin necrosis: No Has patient had a PCN reaction that required hospitalization: No Has patient had a PCN reaction occurring within the last 10 years: No If all of the above answers are "NO", then may proceed with Cephalosporin use.    Zoloft [Sertraline Hcl] Other (See Comments)    Patient reported Psychomotor slowing / worsened depression   Victoza [Liraglutide]     Did not tolerate this medication.  Complained of dizziness even at the lowest dose   Chantix [Varenicline] Hives   Lisinopril Cough   [name] [Insulin Degludec] Rash    Social History: The patient  reports that he has been smoking. He has a 46.00 pack-year  smoking history. He has never used smokeless tobacco. He reports previous alcohol use of about 1.0 standard drink of alcohol per week. He reports that he does not use drugs.   Family History: The patient's family history includes COPD in his father; Diabetes in his father and mother; Heart attack in his brother and father; Heart disease in his father; Osteoporosis in his mother; Stroke in his maternal grandfather, maternal grandmother, paternal grandfather, and paternal grandmother.   Review of Systems: Please see the history of present illness.   All other systems are reviewed and negative.   Physical Exam: VS:  BP Marland Kitchen)  144/86   Pulse 84   Ht 5\' 10"  (1.778 m)   Wt 86.2 kg   BMI 27.26 kg/m  .  BMI Body mass index is 27.26 kg/m.  Wt Readings from Last 3 Encounters:  04/15/21 86.2 kg  01/21/21 88.5 kg  08/20/20 87.5 kg    General: Alert and in no acute distress.   Cardiac: Regular rate and rhythm. Heart tones are distant. No edema.  Respiratory:  Lungs are coarse but with normal work of breathing.  GI: Soft and nontender.  MS: No deformity or atrophy. Gait and ROM intact.  Skin: Warm and dry. Color is normal.  Neuro:  Strength and sensation are intact and no gross focal deficits noted.  Psych: Alert, appropriate and with normal affect.   LABORATORY DATA:  EKG:  EKG is ordered today.  Personally reviewed by me. This demonstrates NSR with PAC noted. HR is 72 today.   Lab Results  Component Value Date   WBC 10.0 06/11/2020   HGB 16.0 06/11/2020   HCT 44.7 06/11/2020   PLT 61 (L) 06/11/2020   GLUCOSE 154 (H) 01/29/2021   CHOL 141 06/11/2020   TRIG 154 (H) 06/11/2020   HDL 41 06/11/2020   LDLCALC 74 06/11/2020   ALT 21 06/11/2020   AST 19 06/11/2020   NA 135 01/29/2021   K 4.5 01/29/2021   CL 98 01/29/2021   CREATININE 0.90 01/29/2021   BUN 17 01/29/2021   CO2 22 01/29/2021   TSH 1.590 06/22/2019   PSA 1.09 05/07/2009   INR 0.9 07/10/2014   HGBA1C 8.2 (A) 01/21/2021    MICROALBUR 1.9 11/22/2014     BNP (last 3 results) No results for input(s): BNP in the last 8760 hours.  ProBNP (last 3 results) No results for input(s): PROBNP in the last 8760 hours.   Other Studies Reviewed Today:  Echo Study Conclusions 02/2017   - Left ventricle: The cavity size was normal. Wall thickness was   increased in a pattern of mild LVH. Systolic function was normal.   The estimated ejection fraction was in the range of 60% to 65%.   Wall motion was normal; there were no regional wall motion   abnormalities. - Aortic valve: Trileaflet; mildly calcified leaflets. - Mitral valve: Calcified annulus. - Right ventricle: The cavity size was mildly dilated. Wall   thickness was normal. Systolic function was normal.   Myoview Study Result 02/2017     There was no ST segment deviation noted during stress. The study is normal. This is a low risk study. Nuclear stress EF: 53%.   Low risk stress nuclear study with normal perfusion and low normal left ventricular global systolic function.        ASSESSMENT & PLAN:     1. CAD - prior LAD PCI from 2015 - known residual disease - needs aggressive CV risk factor modification. He has no worrisome symptoms  He has been maintained on chronic DAPT, statin and beta blocker. 4 years since last stress test and poorly controlled risk factors Discussed f/u lexiscan myovue    2. Tobacco abuse - total cessation encouraged - Note tolerated Chantix in past  Rash with nicotine patch lung cancer screening CT negative 10/17/20 repeat yearly   3. DM - uncontrolled. A1c 8.2 f/u primary Dr Dareen Piano   4. HLD - on statin - labs from October noted.   5. HTN - BP looks good on his current regimen - no changes made today.   6. HIV -  followed by Dr. Johnnye Sima  7. Decreased platelet count - 61 on 06/11/20 don't see repeat   Current medicines are reviewed with the patient today.  The patient does not have concerns regarding medicines other than  what has been noted above.  The following changes have been made:  See above.  Labs/ tests ordered today include:   Lexiscan myovue     Disposition:   FU in a year    Patient is agreeable to this plan and will call if any problems develop in the interim.   Signed: Jenkins Rouge, MD  04/15/2021 5:00 PM  [facility] [address] [place], [place]  [address] Phone: [phone] Fax: [phone]

## 2021-04-07 ENCOUNTER — Other Ambulatory Visit: Payer: Self-pay | Admitting: Internal Medicine

## 2021-04-07 DIAGNOSIS — J42 Unspecified chronic bronchitis: Secondary | ICD-10-CM

## 2021-04-10 ENCOUNTER — Other Ambulatory Visit: Payer: Self-pay | Admitting: Internal Medicine

## 2021-04-10 DIAGNOSIS — E7800 Pure hypercholesterolemia, unspecified: Secondary | ICD-10-CM

## 2021-04-10 DIAGNOSIS — K219 Gastro-esophageal reflux disease without esophagitis: Secondary | ICD-10-CM

## 2021-04-10 DIAGNOSIS — I10 Essential (primary) hypertension: Secondary | ICD-10-CM

## 2021-04-11 MED ORDER — ATORVASTATIN CALCIUM 40 MG PO TABS: 40.0000 mg | ORAL_TABLET | Freq: Every day | ORAL | 0 refills | Status: DC

## 2021-04-11 MED ORDER — OMEPRAZOLE 20 MG PO CPDR: DELAYED_RELEASE_CAPSULE | ORAL | 3 refills | Status: DC

## 2021-04-12 DIAGNOSIS — E1165 Type 2 diabetes mellitus with hyperglycemia: Secondary | ICD-10-CM | POA: Diagnosis not present

## 2021-04-15 ENCOUNTER — Other Ambulatory Visit: Payer: Self-pay

## 2021-04-15 ENCOUNTER — Ambulatory Visit: Payer: Medicare HMO | Admitting: Cardiovascular Disease

## 2021-04-15 VITALS — BP 144/86 | HR 84 | Ht 70.0 in | Wt 190.0 lb

## 2021-04-15 DIAGNOSIS — I10 Essential (primary) hypertension: Secondary | ICD-10-CM | POA: Diagnosis not present

## 2021-04-15 DIAGNOSIS — E782 Mixed hyperlipidemia: Secondary | ICD-10-CM | POA: Diagnosis not present

## 2021-04-15 DIAGNOSIS — I2510 Atherosclerotic heart disease of native coronary artery without angina pectoris: Secondary | ICD-10-CM

## 2021-04-15 DIAGNOSIS — F17200 Nicotine dependence, unspecified, uncomplicated: Secondary | ICD-10-CM | POA: Diagnosis not present

## 2021-04-15 DIAGNOSIS — E118 Type 2 diabetes mellitus with unspecified complications: Secondary | ICD-10-CM

## 2021-04-15 MED ORDER — NITROGLYCERIN 0.4 MG SL SUBL: SUBLINGUAL_TABLET | SUBLINGUAL | 3 refills | Status: DC

## 2021-04-15 NOTE — Patient Instructions (Addendum)
Medication Instructions:  *If you need a refill on your cardiac medications before your next appointment, please call your pharmacy*  Lab Work: If you have labs (blood work) drawn today and your tests are completely normal, you will receive your results only by: [place] (if you have MyChart) OR A paper copy in the mail If you have any lab test that is abnormal or we need to change your treatment, we will call you to review the results.  Testing/Procedures: Your physician has requested that you have a lexiscan myoview. For further information please visit [email or web address]. Please follow instruction sheet, as given.  Follow-Up: At [facility], you and your health needs are our priority.  As part of our continuing mission to provide you with exceptional heart care, we have created designated Provider Care Teams.  These Care Teams include your primary Cardiologist (physician) and Advanced Practice Providers (APPs -  Physician Assistants and Nurse Practitioners) who all work together to provide you with the care you need, when you need it.  We recommend signing up for the patient portal called "MyChart".  Sign up information is provided on this After Visit Summary.  MyChart is used to connect with patients for Virtual Visits (Telemedicine).  Patients are able to view lab/test results, encounter notes, upcoming appointments, etc.  Non-urgent messages can be sent to your provider as well.   To learn more about what you can do with MyChart, go to [email or web address].    Your next appointment:   12 month(s)  The format for your next appointment:   In Person  Provider:   You may see Dr. Johnsie Cancel or one of the following Advanced Practice Providers on your designated Care Team:   Cecilie Kicks, NP

## 2021-04-17 ENCOUNTER — Telehealth (HOSPITAL_COMMUNITY): Payer: Self-pay | Admitting: *Deleted

## 2021-04-17 NOTE — Telephone Encounter (Signed)
Patient given detailed instructions per Myocardial Perfusion Study Information Sheet for the test on 04/23/21 Patient notified to arrive 15 minutes early and that it is imperative to arrive on time for appointment to keep from having the test rescheduled.  If you need to cancel or reschedule your appointment, please call the office within 24 hours of your appointment. . Patient verbalized understanding. Kimberly Coye Jacqueline   

## 2021-04-18 ENCOUNTER — Other Ambulatory Visit: Payer: Self-pay | Admitting: Internal Medicine

## 2021-04-18 DIAGNOSIS — E1169 Type 2 diabetes mellitus with other specified complication: Secondary | ICD-10-CM

## 2021-04-18 DIAGNOSIS — Z794 Long term (current) use of insulin: Secondary | ICD-10-CM

## 2021-04-23 ENCOUNTER — Other Ambulatory Visit: Payer: Self-pay

## 2021-04-23 ENCOUNTER — Ambulatory Visit (HOSPITAL_COMMUNITY): Payer: Medicare HMO | Attending: Cardiology

## 2021-04-23 DIAGNOSIS — I2510 Atherosclerotic heart disease of native coronary artery without angina pectoris: Secondary | ICD-10-CM | POA: Diagnosis not present

## 2021-04-23 LAB — MYOCARDIAL PERFUSION IMAGING
LV dias vol: 84 mL (ref 62–150)
LV sys vol: 40 mL
Peak HR: 88 {beats}/min
Rest HR: 59 {beats}/min
SDS: 1
SRS: 0
SSS: 1
TID: 1.07

## 2021-04-23 MED ORDER — AMINOPHYLLINE 25 MG/ML IV SOLN
75.0000 mg | Freq: Once | INTRAVENOUS | Status: AC
  Administered 2021-04-23: 75 mg via INTRAVENOUS

## 2021-04-23 MED ORDER — TECHNETIUM TC 99M TETROFOSMIN IV KIT
11.0000 | PACK | Freq: Once | INTRAVENOUS | Status: AC | PRN
  Administered 2021-04-23: 11 via INTRAVENOUS
  Filled 2021-04-23: qty 11

## 2021-04-23 MED ORDER — TECHNETIUM TC 99M TETROFOSMIN IV KIT
32.8000 | PACK | Freq: Once | INTRAVENOUS | Status: AC | PRN
  Administered 2021-04-23: 32.8 via INTRAVENOUS
  Filled 2021-04-23: qty 33

## 2021-04-23 MED ORDER — REGADENOSON 0.4 MG/5ML IV SOLN
0.4000 mg | Freq: Once | INTRAVENOUS | Status: AC
  Administered 2021-04-23: 0.4 mg via INTRAVENOUS

## 2021-04-26 ENCOUNTER — Other Ambulatory Visit: Payer: Self-pay | Admitting: Infectious Diseases

## 2021-04-26 DIAGNOSIS — B20 Human immunodeficiency virus [HIV] disease: Secondary | ICD-10-CM

## 2021-04-30 ENCOUNTER — Other Ambulatory Visit: Payer: Self-pay | Admitting: Internal Medicine

## 2021-04-30 ENCOUNTER — Other Ambulatory Visit: Payer: Self-pay | Admitting: *Deleted

## 2021-04-30 DIAGNOSIS — N5201 Erectile dysfunction due to arterial insufficiency: Secondary | ICD-10-CM

## 2021-04-30 MED ORDER — TADALAFIL 5 MG PO TABS: 5.0000 mg | ORAL_TABLET | Freq: Every day | ORAL | 0 refills | Status: DC | PRN

## 2021-04-30 NOTE — Telephone Encounter (Signed)
Pt stops by [facility] to drop off paperwork for [employer] [facility]). 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   Will place paperwork in pharmacy box in medical records. Any recommendations/options for patient while he awaits application process? Samples?   Of note, CMA did ask patient how much medication would be at this pharmacy and he did not know, [facility] just told him it was "expensive".

## 2021-05-14 ENCOUNTER — Encounter: Payer: Self-pay | Admitting: Internal Medicine

## 2021-05-15 ENCOUNTER — Telehealth: Payer: Self-pay

## 2021-05-15 ENCOUNTER — Other Ambulatory Visit: Payer: Self-pay | Admitting: Internal Medicine

## 2021-05-15 DIAGNOSIS — Z79891 Long term (current) use of opiate analgesic: Secondary | ICD-10-CM

## 2021-05-15 DIAGNOSIS — M159 Polyosteoarthritis, unspecified: Secondary | ICD-10-CM | POA: Diagnosis not present

## 2021-05-15 DIAGNOSIS — M545 Low back pain, unspecified: Secondary | ICD-10-CM | POA: Diagnosis not present

## 2021-05-15 DIAGNOSIS — M0579 Rheumatoid arthritis with rheumatoid factor of multiple sites without organ or systems involvement: Secondary | ICD-10-CM | POA: Diagnosis not present

## 2021-05-15 DIAGNOSIS — G8929 Other chronic pain: Secondary | ICD-10-CM | POA: Diagnosis not present

## 2021-05-15 MED ORDER — HYDROCODONE-ACETAMINOPHEN 7.5-325 MG PO TABS: 1.0000 | ORAL_TABLET | Freq: Four times a day (QID) | ORAL | 0 refills | Status: DC | PRN

## 2021-05-15 NOTE — Telephone Encounter (Signed)
Patient presented today to pick-up Basaglar. Patient stated he was previously taking 20-21 units but has been out for the past 3 weeks. Despite being out for the past 3 weeks patient reports one hypoglycemic episode in which he hadn't eaten and a 7 day blood glucose average of 180. Patient has not taken the Ozempic '2mg'$  dose and is still on the '1mg'$  dose though asked to increase at last office visit in April. Ozempic is currently on nationwide backorder but will attempt to obtain Ozempic '2mg'$  dose for patient and send to his preferred pharmacy. Discussed with patient holding off on insulin for the time being and titrating up Ozempic instead as his blood glucose is pretty well controlled having been off of insulin therapy.

## 2021-05-15 NOTE — Progress Notes (Signed)
Submitted application for [facility] to [place] for patient assistance.   Phone: [phone]

## 2021-05-15 NOTE — Telephone Encounter (Signed)
Samples provided for pt while PAP application ([facility]) processes.   Medication Samples have been provided to the patient.  Drug name: Kara Mead      Strength: U100        Qty: 1 BOX (1 PEN)  LOT: [id number] AC  Exp.Date: 02/2022  Dosing instructions: INJECT 20 UNITS ONCE DAILY  Vista Deck 9:40 AM 05/15/2021

## 2021-05-16 ENCOUNTER — Other Ambulatory Visit (HOSPITAL_COMMUNITY)
Admission: RE | Admit: 2021-05-16 | Discharge: 2021-05-16 | Disposition: A | Discharge status: Home / Self Care | Payer: Medicare HMO | Source: Ambulatory Visit | Attending: Infectious Diseases | Admitting: Infectious Diseases

## 2021-05-16 ENCOUNTER — Other Ambulatory Visit: Payer: Medicare HMO

## 2021-05-16 ENCOUNTER — Other Ambulatory Visit: Payer: Self-pay | Admitting: Internal Medicine

## 2021-05-16 ENCOUNTER — Other Ambulatory Visit: Payer: Self-pay

## 2021-05-16 ENCOUNTER — Other Ambulatory Visit: Payer: Self-pay | Admitting: Infectious Diseases

## 2021-05-16 DIAGNOSIS — E11618 Type 2 diabetes mellitus with other diabetic arthropathy: Secondary | ICD-10-CM | POA: Diagnosis not present

## 2021-05-16 DIAGNOSIS — Z113 Encounter for screening for infections with a predominantly sexual mode of transmission: Secondary | ICD-10-CM | POA: Insufficient documentation

## 2021-05-16 DIAGNOSIS — B20 Human immunodeficiency virus [HIV] disease: Secondary | ICD-10-CM

## 2021-05-16 DIAGNOSIS — I2510 Atherosclerotic heart disease of native coronary artery without angina pectoris: Secondary | ICD-10-CM

## 2021-05-16 DIAGNOSIS — Z79899 Other long term (current) drug therapy: Secondary | ICD-10-CM

## 2021-05-16 DIAGNOSIS — I10 Essential (primary) hypertension: Secondary | ICD-10-CM

## 2021-05-16 MED ORDER — OZEMPIC (2 MG/DOSE) 8 MG/3ML ~~LOC~~ SOPN: 2.0000 mg | PEN_INJECTOR | SUBCUTANEOUS | 2 refills | Status: DC

## 2021-05-16 NOTE — Addendum Note (Signed)
Addended by: Hughes Better on: 05/16/2021 09:44 AM   Modules accepted: Orders

## 2021-05-19 LAB — COMPREHENSIVE METABOLIC PANEL
AG Ratio: 1.9 (calc) (ref 1.0–2.5)
ALT: 17 U/L (ref 9–46)
AST: 18 U/L (ref 10–35)
Albumin: 4.3 g/dL (ref 3.6–5.1)
Alkaline phosphatase (APISO): 70 U/L (ref 35–144)
BUN: 14 mg/dL (ref 7–25)
CO2: 27 mmol/L (ref 20–32)
Calcium: 10.3 mg/dL (ref 8.6–10.3)
Chloride: 105 mmol/L (ref 98–110)
Creat: 1.03 mg/dL (ref 0.70–1.35)
Globulin: 2.3 g/dL (calc) (ref 1.9–3.7)
Glucose, Bld: 164 mg/dL — ABNORMAL HIGH (ref 65–99)
Potassium: 4.1 mmol/L (ref 3.5–5.3)
Sodium: 139 mmol/L (ref 135–146)
Total Bilirubin: 0.7 mg/dL (ref 0.2–1.2)
Total Protein: 6.6 g/dL (ref 6.1–8.1)

## 2021-05-19 LAB — CBC
HCT: 46.2 % (ref 38.5–50.0)
Hemoglobin: 15.7 g/dL (ref 13.2–17.1)
MCH: 31.2 pg (ref 27.0–33.0)
MCHC: 34 g/dL (ref 32.0–36.0)
MCV: 91.8 fL (ref 80.0–100.0)
MPV: 10.9 fL (ref 7.5–12.5)
RBC: 5.03 10*6/uL (ref 4.20–5.80)
RDW: 12.8 % (ref 11.0–15.0)
WBC: 7.1 10*3/uL (ref 3.8–10.8)

## 2021-05-19 LAB — T-HELPER CELLS (CD4) COUNT (NOT AT ARMC)
Absolute CD4: 900 cells/uL (ref 490–1740)
CD4 T Helper %: 46 % (ref 30–61)
Total lymphocyte count: 1976 cells/uL (ref 850–3900)

## 2021-05-19 LAB — RPR: RPR Ser Ql: NONREACTIVE

## 2021-05-19 LAB — LIPID PANEL
Cholesterol: 129 mg/dL (ref <200)
HDL: 38 mg/dL — ABNORMAL LOW (ref >=40)
LDL Cholesterol (Calc): 60 mg/dL (calc)
Non-HDL Cholesterol (Calc): 91 mg/dL (calc) (ref <130)
Total CHOL/HDL Ratio: 3.4 (calc) (ref <5.0)
Triglycerides: 246 mg/dL — ABNORMAL HIGH (ref <150)

## 2021-05-19 LAB — HIV-1 RNA QUANT-NO REFLEX-BLD
HIV 1 RNA Quant: <20 Copies/mL — ABNORMAL HIGH
HIV-1 RNA Quant, Log: <1.3 Log cps/mL — ABNORMAL HIGH

## 2021-05-19 LAB — URINE CYTOLOGY ANCILLARY ONLY
Chlamydia: NEGATIVE
Comment: NEGATIVE
Comment: NORMAL
Neisseria Gonorrhea: NEGATIVE

## 2021-05-23 NOTE — Progress Notes (Signed)
Received notification from [place] regarding approval for [facility]. Patient assistance approved from 05/15/21 to 09/27/21.  Medication will ship to pt's home  Phone: [phone]

## 2021-05-25 ENCOUNTER — Other Ambulatory Visit: Payer: Self-pay | Admitting: Internal Medicine

## 2021-05-25 DIAGNOSIS — I10 Essential (primary) hypertension: Secondary | ICD-10-CM

## 2021-05-30 ENCOUNTER — Telehealth: Payer: Self-pay

## 2021-05-30 ENCOUNTER — Ambulatory Visit: Payer: Medicare HMO

## 2021-05-30 ENCOUNTER — Other Ambulatory Visit: Payer: Self-pay

## 2021-05-30 ENCOUNTER — Encounter: Payer: Self-pay | Admitting: Infectious Diseases

## 2021-05-30 ENCOUNTER — Ambulatory Visit (INDEPENDENT_AMBULATORY_CARE_PROVIDER_SITE_OTHER): Payer: Medicare HMO | Admitting: Infectious Diseases

## 2021-05-30 ENCOUNTER — Other Ambulatory Visit (HOSPITAL_COMMUNITY): Payer: Self-pay

## 2021-05-30 ENCOUNTER — Other Ambulatory Visit: Payer: Self-pay | Admitting: Pharmacist

## 2021-05-30 VITALS — BP 148/84 | HR 67 | Wt 188.8 lb

## 2021-05-30 DIAGNOSIS — I2510 Atherosclerotic heart disease of native coronary artery without angina pectoris: Secondary | ICD-10-CM

## 2021-05-30 DIAGNOSIS — E11618 Type 2 diabetes mellitus with other diabetic arthropathy: Secondary | ICD-10-CM | POA: Diagnosis not present

## 2021-05-30 DIAGNOSIS — Z113 Encounter for screening for infections with a predominantly sexual mode of transmission: Secondary | ICD-10-CM

## 2021-05-30 DIAGNOSIS — Z720 Tobacco use: Secondary | ICD-10-CM

## 2021-05-30 DIAGNOSIS — Z79899 Other long term (current) drug therapy: Secondary | ICD-10-CM | POA: Diagnosis not present

## 2021-05-30 DIAGNOSIS — B20 Human immunodeficiency virus [HIV] disease: Secondary | ICD-10-CM

## 2021-05-30 DIAGNOSIS — Z794 Long term (current) use of insulin: Secondary | ICD-10-CM | POA: Diagnosis not present

## 2021-05-30 MED ORDER — CABOTEGRAVIR & RILPIVIRINE ER 600 & 900 MG/3ML IM SUER
1.0000 | INTRAMUSCULAR | 1 refills | Status: DC
  Filled 2021-05-30: qty 6, 30d supply, fill #0

## 2021-05-30 MED ORDER — CABENUVA 600 & 900 MG/3ML IM SUER
1.0000 | INTRAMUSCULAR | 5 refills | Status: DC
  Filled 2021-05-30: qty 6, 60d supply, fill #0
  Filled 2021-06-30: qty 6, 30d supply, fill #0
  Filled 2021-07-23: qty 6, 30d supply, fill #1
  Filled 2021-09-16: qty 6, 30d supply, fill #2
  Filled 2021-11-25 – 2021-11-27 (×2): qty 6, 30d supply, fill #3
  Filled 2022-01-21: qty 6, 30d supply, fill #4

## 2021-05-30 NOTE — Assessment & Plan Note (Signed)
His recent stress is re-assuring.  Appreciate Dr Kyla Balzarine f/u.  Encouraged to quit smoking.

## 2021-05-30 NOTE — Progress Notes (Signed)
Subjective:    Patient ID: Riley Jones, male  DOB: [birth year], 65 y.o.        MRN: [id number]   HPI 65 yo M with hx of HIV+ (~32yr), DM2 (>181yr on insulin) and RA (took MTX without relief, stopped plaquenil- was hurting his eyes), COPD. [facility]), 07-2017 he was changed to biktarvy.  Also hx of smoking. Has cut back some. increaes with work stress. 1ppd. > 20years.  CAD s/p DES to mid LAD in 06/2014, his EF 55-65% at that time. He had a myoview 11-2014- low risk.  He was seen by CV this summer and was to have myoview to f/u: (03-2021) no ischemia, low risk.   Has been doing well, continues to work at [employer]"they are working me to death".  Adopted chihua feb.   Got flu shot 05-27-21.   Has not had eye exam yet this year.    HIV 1 RNA Quant  Date Value  05/16/2021 <20 Copies/mL (H)  06/11/2020 <20 Copies/mL (H)  08/09/2019 <20 DETECTED copies/mL (A)   CD4 T Cell Abs (/uL)  Date Value  06/11/2020 848  08/09/2019 615  10/25/2018 760     Health Maintenance  Topic Date Due  . Zoster Vaccines- Shingrix (2 of 2) 06/30/2019  . HEMOGLOBIN A1C  04/22/2021  . OPHTHALMOLOGY EXAM  05/02/2021  . COVID-19 Vaccine (5 - Booster for Pfizer series) 05/31/2021  . FOOT EXAM  01/21/2022  . LIPID PANEL  05/16/2022  . COLONOSCOPY (Pts [id number] coverage will need to be confirmed)  11/17/2027  . TETANUS/TDAP  06/14/2030  . INFLUENZA VACCINE  Completed  . Hepatitis C Screening  Completed  . PNA vac Low Risk Adult  Completed  . HPV VACCINES  Aged Out  . COLON CANCER SCREENING ANNUAL FOBT  Discontinued      Review of Systems  Constitutional:  Negative for chills, fever and weight loss.  Respiratory:  Negative for cough and shortness of breath.   Cardiovascular:  Negative for chest pain.  Gastrointestinal:  Negative for constipation and diarrhea.  Genitourinary:  Negative for dysuria.  Musculoskeletal:  Positive for joint pain.   Neurological:  Positive for sensory change.   Please see HPI. All other systems reviewed and negative.     Objective:  Physical Exam Vitals reviewed.  Constitutional:      Appearance: Normal appearance.  HENT:     Mouth/Throat:     Mouth: Mucous membranes are moist.     Pharynx: No oropharyngeal exudate.  Eyes:     Extraocular Movements: Extraocular movements intact.     Pupils: Pupils are equal, round, and reactive to light.  Cardiovascular:     Rate and Rhythm: Normal rate and regular rhythm.     Pulses:          Dorsalis pedis pulses are 3+ on the right side and 3+ on the left side.  Pulmonary:     Effort: Pulmonary effort is normal.     Breath sounds: Normal breath sounds.  Abdominal:     General: Bowel sounds are normal. There is no distension.     Palpations: Abdomen is soft.     Tenderness: There is no abdominal tenderness.  Musculoskeletal:        General: Normal range of motion.     Cervical back: Normal range of motion and neck supple.     Right lower leg: No edema.     Left lower  leg: No edema.     Right foot: Normal range of motion. No deformity.     Left foot: Normal range of motion. No deformity.  Feet:     Right foot:     Protective Sensation: 3 sites tested.  3 sites sensed.     Skin integrity: Skin integrity normal.     Toenail Condition: Right toenails are normal.     Left foot:     Protective Sensation: 3 sites tested.  3 sites sensed.     Skin integrity: Skin integrity normal.     Toenail Condition: Left toenails are normal.  Neurological:     General: No focal deficit present.     Mental Status: He is alert.           Assessment & Plan:

## 2021-05-30 NOTE — Telephone Encounter (Signed)
[facility] Patient Advocate Encounter  [facility] Reap is covered under patient pharmacy benefits.   Prescription can be filled at [facility].  Patient just filled Biktarvy on 05/25/21, [facility] will not be able to fill until 21 days .  Ileene Patrick, [facility] Patient [facility] for Infectious Disease Phone: [phone] Fax:  [phone]

## 2021-05-30 NOTE — Assessment & Plan Note (Signed)
Encouraged to quit. 

## 2021-05-30 NOTE — Assessment & Plan Note (Signed)
He is interested in cabaneuva.  Will route to pharm Not sexually active. His partner is long distance.  Defers monkeypox vax.  Will see him back in 9 months.

## 2021-05-30 NOTE — Assessment & Plan Note (Signed)
He appears to be doing well My great appreciation to [facility] for f/u.  He will see ophtho this year.

## 2021-05-30 NOTE — Telephone Encounter (Signed)
Spoke with Mallie Mussel on the phone about his Cabenuva coverage. He will continue taking his Biktarvy this month and follow-up with [place] on 10/11 for his first injection. Explained the first two doses are 1 month apart and after that is every 2 months. Discussed that each time he will receive 2 shots - one in each gluteus muscle. Patient is on board and excited. Thanks Butch Penny!

## 2021-05-30 NOTE — Telephone Encounter (Signed)
[facility] Patient Advocate Encounter   I was successful in securing patient a $ 7500.00 grant from [employer] to provide copayment coverage for Biktarvy.  The patient's out of pocket cost will be 5.00 monthly.     I have spoken with the patient.       Good through 05/30/21-09/27/21   Patient knows to call the office with questions or concerns.  Ileene Patrick, [facility] Patient [facility] for Infectious Disease Phone: [phone] Fax:  [phone]

## 2021-06-12 ENCOUNTER — Other Ambulatory Visit: Payer: Self-pay | Admitting: Internal Medicine

## 2021-06-12 DIAGNOSIS — N5201 Erectile dysfunction due to arterial insufficiency: Secondary | ICD-10-CM

## 2021-06-12 DIAGNOSIS — Z79891 Long term (current) use of opiate analgesic: Secondary | ICD-10-CM

## 2021-06-12 MED ORDER — TADALAFIL 5 MG PO TABS: 5.0000 mg | ORAL_TABLET | Freq: Every day | ORAL | 0 refills | Status: DC | PRN

## 2021-06-12 MED ORDER — HYDROCODONE-ACETAMINOPHEN 7.5-325 MG PO TABS: 1.0000 | ORAL_TABLET | Freq: Four times a day (QID) | ORAL | 0 refills | Status: DC | PRN

## 2021-06-12 NOTE — Telephone Encounter (Signed)
Last office visit: 01/21/21 Last UDS: 01/21/21 Last Sent: 05/15/21  #30 Next appt: 07/01/21

## 2021-06-25 ENCOUNTER — Other Ambulatory Visit: Payer: Self-pay

## 2021-06-25 DIAGNOSIS — N5201 Erectile dysfunction due to arterial insufficiency: Secondary | ICD-10-CM

## 2021-06-25 MED ORDER — TADALAFIL 5 MG PO TABS: 5.0000 mg | ORAL_TABLET | Freq: Every day | ORAL | 0 refills | Status: DC | PRN

## 2021-06-30 ENCOUNTER — Encounter: Payer: Self-pay | Admitting: Internal Medicine

## 2021-06-30 ENCOUNTER — Other Ambulatory Visit (HOSPITAL_COMMUNITY): Payer: Self-pay

## 2021-07-01 ENCOUNTER — Telehealth: Payer: Self-pay

## 2021-07-01 ENCOUNTER — Ambulatory Visit (INDEPENDENT_AMBULATORY_CARE_PROVIDER_SITE_OTHER): Payer: Medicare HMO | Admitting: Internal Medicine

## 2021-07-01 VITALS — BP 154/76 | HR 67 | Ht 70.0 in | Wt 191.5 lb

## 2021-07-01 DIAGNOSIS — Z794 Long term (current) use of insulin: Secondary | ICD-10-CM

## 2021-07-01 DIAGNOSIS — Z0000 Encounter for general adult medical examination without abnormal findings: Secondary | ICD-10-CM

## 2021-07-01 DIAGNOSIS — I2510 Atherosclerotic heart disease of native coronary artery without angina pectoris: Secondary | ICD-10-CM | POA: Diagnosis not present

## 2021-07-01 DIAGNOSIS — Z79891 Long term (current) use of opiate analgesic: Secondary | ICD-10-CM

## 2021-07-01 DIAGNOSIS — R197 Diarrhea, unspecified: Secondary | ICD-10-CM

## 2021-07-01 DIAGNOSIS — M069 Rheumatoid arthritis, unspecified: Secondary | ICD-10-CM

## 2021-07-01 DIAGNOSIS — I10 Essential (primary) hypertension: Secondary | ICD-10-CM

## 2021-07-01 DIAGNOSIS — J42 Unspecified chronic bronchitis: Secondary | ICD-10-CM | POA: Diagnosis not present

## 2021-07-01 DIAGNOSIS — E1169 Type 2 diabetes mellitus with other specified complication: Secondary | ICD-10-CM

## 2021-07-01 DIAGNOSIS — B20 Human immunodeficiency virus [HIV] disease: Secondary | ICD-10-CM | POA: Diagnosis not present

## 2021-07-01 DIAGNOSIS — Z720 Tobacco use: Secondary | ICD-10-CM | POA: Diagnosis not present

## 2021-07-01 DIAGNOSIS — F32A Depression, unspecified: Secondary | ICD-10-CM

## 2021-07-01 LAB — POCT GLYCOSYLATED HEMOGLOBIN (HGB A1C): Hemoglobin A1C: 7.4 % — AB (ref 4.0–5.6)

## 2021-07-01 LAB — GLUCOSE, CAPILLARY: Glucose-Capillary: 143 mg/dL — ABNORMAL HIGH (ref 70–99)

## 2021-07-01 MED ORDER — SYNJARDY XR 10-1000 MG PO TB24: 1.0000 | ORAL_TABLET | Freq: Every day | ORAL | 1 refills | Status: DC

## 2021-07-01 NOTE — Assessment & Plan Note (Signed)
-   This problem is chronic and well controlled -Great appreciation to Dr. Johnnye Sima for taking care of him -He will be transitioning to cabaneuva from [facility] -No further work-up at this time

## 2021-07-01 NOTE — Assessment & Plan Note (Signed)
-   This problem is chronic and stable -Patient only needs to use his albuterol occasionally.  He states that he may need to use it once a week but most often he does not even need to do that -Patient is still an active smoker and smokes 1 pack/day.  He states that Chantix made his depression worse and that the Wellbutrin did not help.  He also developed a rash with the nicotine patch -We talked about using nicotine lozenges to help him with smoking cessation and he states that he will try them.

## 2021-07-01 NOTE — Assessment & Plan Note (Signed)
-   This problem is chronic and stable - The benefits of prescribing Norco currently outweighs the risk. He is able to do his daily activities and work at his job - Will check UDS - No further work up at this time

## 2021-07-01 NOTE — Assessment & Plan Note (Signed)
BP Readings from Last 3 Encounters:  07/01/21 (!) 154/76  05/30/21 (!) 148/84  04/15/21 (!) 144/86    Lab Results  Component Value Date   NA 139 05/16/2021   K 4.1 05/16/2021   CREATININE 1.03 05/16/2021    Assessment: Blood pressure control:  Fair Progress toward BP goal:   Unchanged Comments: Patient is compliant with terazosin 2 mg, losartan/HCTZ 50/12.5 mg and carvedilol 3.125 mg twice daily  Plan: Medications:  continue current medications Educational resources provided:   Self management tools provided:   Other plans: Patient was noted to have well-controlled blood pressure at his cardiologist office prior to this and so we will not change medications at this time but if his blood pressure remains elevated we will increase his carvedilol at his next visit

## 2021-07-01 NOTE — Progress Notes (Signed)
   Subjective:    Patient ID: Riley Jones, male    DOB: 07-03-1956, 65 y.o.   MRN: [id number]  Diabetes   I have seen and examined this patient.  Patient is here for routine follow-up of his diabetes and hypertension.  Patient states that he has had intermittent diarrhea over the last couple weeks up to 7-8 episodes of diarrhea per day.  He denies any new medications at this time or increasing the dose of his medication except for increasing his Ozempic last week but his diarrhea had started prior to doing this.  He also complained of some left eye pain and stopped taking his Plaquenil as he was worried that this was causing his pain.  He denies any other complaints at this time and states that he feels well and is compliant with his medications.  Review of Systems  Constitutional: Negative.   HENT: Negative.    Eyes:  Positive for pain.  Respiratory: Negative.    Cardiovascular: Negative.   Gastrointestinal:  Positive for diarrhea.  Musculoskeletal: Negative.   Skin: Negative.   Neurological: Negative.   Psychiatric/Behavioral: Negative.        Objective:   Physical Exam Constitutional:      Appearance: Normal appearance.  HENT:     Head: Normocephalic and atraumatic.  Eyes:     Extraocular Movements: Extraocular movements intact.     Conjunctiva/sclera: Conjunctivae normal.     Pupils: Pupils are equal, round, and reactive to light.  Cardiovascular:     Rate and Rhythm: Normal rate and regular rhythm.     Heart sounds: Normal heart sounds.  Pulmonary:     Effort: Pulmonary effort is normal. No respiratory distress.     Breath sounds: Normal breath sounds. No wheezing.  Abdominal:     General: Bowel sounds are normal. There is no distension.     Palpations: Abdomen is soft.     Tenderness: There is no abdominal tenderness.  Musculoskeletal:        General: No swelling or tenderness.  Skin:    General: Skin is warm and dry.  Neurological:     General: No focal  deficit present.     Mental Status: He is alert and oriented to person, place, and time.  Psychiatric:        Mood and Affect: Mood normal.        Behavior: Behavior normal.          Assessment & Plan:  Please see problem list charting for assessment and plan:

## 2021-07-01 NOTE — Assessment & Plan Note (Signed)
Lab Results  Component Value Date   HGBA1C 7.4 (A) 07/01/2021   HGBA1C 8.2 (A) 01/21/2021   HGBA1C 8.8 (A) 11/28/2019     Assessment: Diabetes control:  Fair Progress toward A1C goal:   Improved Comments: Patient's A1c is better controlled.  He was noted to have some lows down to the 50s and 60s.  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  I encouraged him to have a snack on the days that he is unloading the truck.  Patient is compliant with Ozempic 2 mg weekly, apply pleasant/metformin 01/999 mg twice daily and 20 units of insulin glargine daily  Plan: Medications:  We will increase his empagliflozin/metformin to 06/999 mg twice daily Home glucose monitoring: Frequency:   Timing:   Instruction/counseling given: reminded to get eye exam, reminded to bring blood glucose meter & log to each visit and discussed the need for weight loss Educational resources provided:   Self management tools provided:   Other plans: We will check BMP at next visit

## 2021-07-01 NOTE — Assessment & Plan Note (Signed)
-   This problem is chronic and stable -Patient's patient 9 score is 4 today -He appears to be doing well off his medications and we will continue to monitor him closely -No further work-up for now

## 2021-07-01 NOTE — Assessment & Plan Note (Signed)
-   Patient complains of diarrhea over the last couple of weeks. - States his diarrhea is watery, 6-7 episodes/day and improved with immodium - He denies starting any new medications and his ozempic dose was increased after his diarrhea started - I suspect this is likely a self-limited viral illness but have instructed him to call me if this persists and we will work him up further

## 2021-07-01 NOTE — Assessment & Plan Note (Signed)
-   This problem is chronic and uncontrolled -Patient complains of intermittent pain in his fingers as well as his hip and back -Patient did follow-up with rheumatology and was started on Plaquenil -Patient states that he developed left eye pain after starting Plaquenil and stopped this medication. -I encouraged the patient to follow-up with his rheumatologist and let them know that he stopped the Plaquenil and to see if there is an alternative medication available or if Plaquenil caused his left eye pain -Also encouraged him to follow-up with his ophthalmologist to evaluate his left eye -We will continue with Voltaren gel as well as Norco for pain control -No further work-up at this time

## 2021-07-01 NOTE — Assessment & Plan Note (Addendum)
-   Patient currently smokes 1/2 to 1 PPD - He has not tolerated chantix (worsening depression) or the nicotine patch (rash) in the past and states welbutrin did not work. - I explained the importance of smoking cessation especially in the context of his CAD and DM.  - We talked about nicotine lozenges and he was interested in trying that (states he does not like the gum) - Will follow up with him at his next visit

## 2021-07-01 NOTE — Telephone Encounter (Signed)
[facility] Patient Advocate Encounter  Patient's medication Kern Reap) have been couriered to [facility] from [facility] and will administered on patient next office visit on 07/08/21.  Ileene Patrick , [facility] Patient [facility] for Infectious Disease Phone: [phone] Fax:  [phone]

## 2021-07-01 NOTE — Assessment & Plan Note (Signed)
-   Patient advised to get COVID booster and states that he will get this done today at the pharmacy works at

## 2021-07-01 NOTE — Assessment & Plan Note (Signed)
-   This problem is chronic and stable -Patient was seen by Dr. Johnsie Cancel recently and had a stress test that was low risk -We will continue with aspirin, Plavix, Lipitor 40 mg daily as well as carvedilol 3.125 mg daily -No further work-up at this time

## 2021-07-01 NOTE — Patient Instructions (Addendum)
-   It was a pleasure seeing you today - Please contact your rheumatologist about stopping plaquenil - Please follow up with your eye doctor for your annual exam and left eye pain - Your blood pressure was a little elevated today. We will repeat this but continue with your current medications for now - If your diarrhea persists please contact us and let us know and we will initiate a work up for this - Please try and quit smoking. Try the nicotine lozenges and see if they will help reduce cravings - Your A1C is improving. Congratulations! We will increase your synjardy dose and recheck this in 3 months. - Please call me with any questions or concerns or if you need any refills

## 2021-07-03 DIAGNOSIS — E1165 Type 2 diabetes mellitus with hyperglycemia: Secondary | ICD-10-CM | POA: Diagnosis not present

## 2021-07-05 LAB — TOXASSURE SELECT,+ANTIDEPR,UR

## 2021-07-08 ENCOUNTER — Other Ambulatory Visit: Payer: Self-pay | Admitting: Cardiovascular Disease

## 2021-07-08 ENCOUNTER — Other Ambulatory Visit: Payer: Self-pay

## 2021-07-08 ENCOUNTER — Ambulatory Visit (INDEPENDENT_AMBULATORY_CARE_PROVIDER_SITE_OTHER): Payer: Medicare HMO | Admitting: Pharmacist

## 2021-07-08 DIAGNOSIS — E7800 Pure hypercholesterolemia, unspecified: Secondary | ICD-10-CM

## 2021-07-08 DIAGNOSIS — B20 Human immunodeficiency virus [HIV] disease: Secondary | ICD-10-CM | POA: Diagnosis not present

## 2021-07-08 MED ORDER — CABOTEGRAVIR & RILPIVIRINE ER 600 & 900 MG/3ML IM SUER
1.0000 | Freq: Once | INTRAMUSCULAR | Status: AC
  Administered 2021-07-08: 1 via INTRAMUSCULAR

## 2021-07-08 MED ORDER — ATORVASTATIN CALCIUM 40 MG PO TABS: 40.0000 mg | ORAL_TABLET | Freq: Every day | ORAL | 0 refills | Status: DC

## 2021-07-08 NOTE — Progress Notes (Addendum)
HPI: Riley Jones is a 65 y.o. male who presents to the [facility] pharmacy clinic for [place] administration.  Patient Active Problem List   Diagnosis Date Noted   Lung nodule 10/17/2020   Psoriasis 11/02/2019   Abdominal hernia 03/22/2019   GERD (gastroesophageal reflux disease) 12/01/2016   Chronic allergic rhinitis 08/25/2016   Chronic prescription opiate use 11/22/2014   CAD (coronary artery disease) 09/05/2014   Tobacco abuse 07/05/2014   Diarrhea 03/09/2014   Anxiety 12/15/2012   Preventative health care 12/15/2012   COPD (chronic obstructive pulmonary disease) (HCC) 12/04/2012   Erectile dysfunction 10/21/2011   Insomnia 08/03/2011   Rheumatoid arthritis (HCC) 02/03/2011   Hypercholesterolemia 10/11/2006   Human immunodeficiency virus (HIV) disease (HCC) 07/08/2006   Diabetes mellitus (HCC) 07/08/2006   Depression 07/08/2006   Carpal tunnel syndrome 07/08/2006   Essential hypertension 07/08/2006    Patient's Medications  New Prescriptions   No medications on file  Previous Medications   ALBUTEROL (VENTOLIN HFA) 108 (90 BASE) MCG/ACT INHALER    INHALE 1 PUFF BY MOUTH EVERY 4 HOURS AS NEEDED FOR SHORTNESS OF BREATH OR MAY USE EVERY 6 HOURS AS NEEDED   ASPIRIN 81 MG EC TABLET    Take 81 mg by mouth daily.     ATORVASTATIN (LIPITOR) 40 MG TABLET    Take 1 tablet (40 mg total) by mouth daily.   BD PEN NEEDLE NANO 2ND GEN 32G X 4 MM MISC    USE UP TO 8 PER WEEK   BETAMETHASONE DIPROPIONATE 0.05 % CREAM    Apply topically 2 (two) times daily.   CABOTEGRAVIR & RILPIVIRINE ER (CABENUVA) 600 & 900 MG/3ML INJECTION    Inject 1 kit into the muscle every 30 (thirty) days.   CABOTEGRAVIR & RILPIVIRINE ER (CABENUVA) 600 & 900 MG/3ML INJECTION    Inject 1 kit into the muscle every 2 (two) months.   CARVEDILOL (COREG) 3.125 MG TABLET    TAKE 1 TABLET BY MOUTH TWICE DAILY WITH A MEAL   CLOPIDOGREL (PLAVIX) 75 MG TABLET    Take 1 tablet (75 mg total) by mouth daily.    EMPAGLIFLOZIN-METFORMIN HCL ER (SYNJARDY XR) 06-999 MG TB24    Take 1 tablet by mouth daily.   FLUTICASONE (FLONASE) 50 MCG/ACT NASAL SPRAY    Place 1 spray into both nostrils daily.   HYDROCODONE-ACETAMINOPHEN (NORCO) 7.5-325 MG TABLET    Take 1 tablet by mouth every 6 (six) hours as needed for moderate pain.   INSULIN GLARGINE (BASAGLAR KWIKPEN) 100 UNIT/ML    Inject 20 Units into the skin daily.   LORATADINE (CLARITIN) 10 MG TABLET    Take 1 tablet (10 mg total) by mouth daily.   LOSARTAN-HYDROCHLOROTHIAZIDE (HYZAAR) 50-12.5 MG TABLET    Take 1 tablet by mouth once daily   MULTIPLE VITAMINS-MINERALS (CENTRUM ADULTS PO)    Take 1 tablet by mouth daily.   NITROGLYCERIN (NITROSTAT) 0.4 MG SL TABLET    PLACE 1 TABLET UNDER THE TONGUE EVERY 5 MINUTES AS NEEDED FOR CHEST PAIN   NYSTATIN (NYSTATIN) POWDER    Apply 1 application topically 2 (two) times daily.   OMEPRAZOLE (PRILOSEC) 20 MG CAPSULE    TAKE 1 CAPSULE BY MOUTH AS NEEDED   SEMAGLUTIDE, 2 MG/DOSE, (OZEMPIC, 2 MG/DOSE,) 8 MG/3ML SOPN    Inject 2 mg into the skin once a week.   TADALAFIL (CIALIS) 5 MG TABLET    Take 1 tablet (5 mg total) by mouth daily as needed for erectile  dysfunction.   TERAZOSIN (HYTRIN) 2 MG CAPSULE    Take 1 capsule by mouth at bedtime  Modified Medications   No medications on file  Discontinued Medications   BIKTARVY 50-200-25 MG TABS TABLET    Take 1 tablet by mouth once daily    Allergies: Allergies  Allergen Reactions   Penicillins Hives, Nausea Only and Rash    Has patient had a PCN reaction causing immediate rash, facial/tongue/throat swelling, SOB or lightheadedness with hypotension: Yes Has patient had a PCN reaction causing severe rash involving mucus membranes or skin necrosis: No Has patient had a PCN reaction that required hospitalization: No Has patient had a PCN reaction occurring within the last 10 years: No If all of the above answers are "NO", then may proceed with Cephalosporin use.    Zoloft  [Sertraline Hcl] Other (See Comments)    Patient reported Psychomotor slowing / worsened depression   Victoza [Liraglutide]     Did not tolerate this medication.  Complained of dizziness even at the lowest dose   Chantix [Varenicline] Hives   Lisinopril Cough   [name] [Insulin Degludec] Rash    Past Medical History: Past Medical History:  Diagnosis Date   Anxiety    Arthritis    "pretty much all over"   Asthma    "mild" (07/17/2014)   Atypical angina ([place])    Myoview 2003, Normal EF 64%   CAD (coronary artery disease)    a. 10/20 DES to 80% mid LAD   [place] rash of groin 11/03/2019   COPD (chronic obstructive pulmonary disease) ([place])    "mild" (07/17/2014)   Depression    Fatigue 06/22/2019   Foot pain 03/30/2011   GERD (gastroesophageal reflux disease)    H/O hiatal hernia    Headache    "weekly" (06/2014)   History of gout    HIV (human immunodeficiency virus infection) ([place]) 1995   DX after shingles followed by Dr. Ola Spurr (ID)   Hx of cardiovascular stress test    ETT-Myoview (3/16):  Ex 10:15, No Ischemia, EF 57%; Normal Study   Hyperlipidemia    Hypertension    Well Controlled off meds   Increased anion gap metabolic acidosis 02/22/7823   Kidney stones    "passed them all"   Left lower quadrant abdominal pain 11/02/2019   Other and unspecified angina pectoris 12/18/2008   Overview:  Overview:  Greatly appreciate PCP f/u.   Last Assessment & Plan:  - Patient with episode of CP - 4 days, constant, substernal, not immediately resolved with NTG, felt better after burping and passing gas - Repeat EKG done here with no acute ST/T wave changes - CP now completely resolved. Encouraged patient to call 911 if recurs - To f/u with cardio in the next 2 weeks for further assess   Pneumonia 4-5 times   Rheumatoid arthritis(714.0)    Followed by Dr. Charlestine Night, off MTX and prdnisone since 2007   Right hip pain 04/25/2019   Right shoulder pain 01/19/2018   Shingles (herpes zoster)  polyneuropathy 11/08/2018   Sleep apnea    does not wear mask (07/17/2014)   Type II diabetes mellitus ([place])     Social History: Social History   Socioeconomic History   Marital status: Single    Spouse name: Not on file   Number of children: Not on file   Years of education: 13   Highest education level: Not on file  Occupational History   Occupation: [employer]   Occupation: Administrator (  retired)  Tobacco Use   Smoking status: Every Day    Packs/day: 1.00    Years: 46.00    Pack years: 46.00    Types: Cigarettes   Smokeless tobacco: Never  Vaping Use   Vaping Use: Never used  Substance and Sexual Activity   Alcohol use: Not Currently    Alcohol/week: 1.0 standard drink    Types: 1 Glasses of wine per week    Comment: 07/17/2014 "glass of wine once or twice/month"   Drug use: No   Sexual activity: Yes    Partners: Male    Comment: declined condoms 08/2019  Other Topics Concern   Not on file  Social History Narrative   [place] approved beginning 12/11/2009 - 12/27/2010   [place] benefits approved; patient eligible for 100% discount for out patient labs and office visits. Patient eligible for 70% discount for other services per Irish Elders 09/08/2010      Patient is on disability, and walks 2-3 times per week.    Social Determinants of Health   Financial Resource Strain: Not on file  Food Insecurity: Not on file  Transportation Needs: Not on file  Physical Activity: Not on file  Stress: Not on file  Social Connections: Not on file    Labs: Lab Results  Component Value Date   HIV1RNAQUANT <20 (H) 05/16/2021   HIV1RNAQUANT <20 (H) 06/11/2020   HIV1RNAQUANT <20 DETECTED (A) 08/09/2019   CD4TABS 848 06/11/2020   CD4TABS 615 08/09/2019   CD4TABS 760 10/25/2018    RPR and STI Lab Results  Component Value Date   LABRPR NON-REACTIVE 05/16/2021   LABRPR NON-REACTIVE 06/11/2020   LABRPR NON-REACTIVE 10/25/2018   LABRPR NON-REACTIVE 01/19/2018   LABRPR  NON-REACTIVE 06/16/2017    STI Results GC CT  05/16/2021 Negative Negative  06/11/2020 Negative Negative  10/25/2018 Negative Negative  01/19/2018 Negative Negative  06/16/2017 Negative Negative  03/02/2016 Negative Negative    Hepatitis B Lab Results  Component Value Date   HEPBSAB YES 11/22/2006   HEPBSAG NO 11/22/2006   Hepatitis C No results found for: HEPCAB, HCVRNAPCRQN Hepatitis A No results found for: HAV Lipids: Lab Results  Component Value Date   CHOL 129 05/16/2021   TRIG 246 (H) 05/16/2021   HDL 38 (L) 05/16/2021   CHOLHDL 3.4 05/16/2021   VLDL UNABLE TO CALCULATE IF TRIGLYCERIDE OVER 400 mg/dL 02/26/2017   LDLCALC 60 05/16/2021    Current HIV Regimen: Biktarvy  TARGET DATE: The 11th of the month  Assessment: Riley Jones presents today for their first initiation injection for [place]. Counseled that [place] is two separate intramuscular injections in the gluteal muscle on each side for each visit. Explained that the second injection is 30 days after the initial injection then every 2 months thereafter. Discussed the need for viral load monitoring every 2 months for the first 6 months and then periodically afterwards as their provider sees the need. Discussed the rare but significant chance of developing resistance despite compliance. Explained that showing up to injection appointments is very important and warned that if 2 appointments are missed, it will be reassessed by their provider whether they are a good candidate for injection therapy. Counseled on possible side effects associated with the injections such as injection site pain, which is usually mild to moderate in nature, injection site nodules, and injection site reactions. Asked to call the clinic or send me a mychart message if they experience any issues, such as fatigue, nausea, headache, rash, or dizziness. Advised that  they can take ibuprofen or tylenol for injection site pain if needed.   Administered  cabotegravir $RemoveBefor'600mg'[id number]$ /57mL in left upper outer quadrant of the gluteal muscle. Administered rilpivirine 900 mg/33mL in the right upper outer quadrant of the gluteal muscle. Monitored patient for 10 minutes after injection. Injections were tolerated well without issue. Counseled to stop taking Biktarvy after today's dose and to call with any issues that may arise. Will make follow up appointments for second initiation injection in 30 days and then maintenance injections every 2 months thereafter for 6 months.   Patient is up to date on vaccines and provided proof of his COVID vaccination today as seen on the media tab. He deferred the monkeypox vaccine.  Plan: - Stop [place] injections administered - Second initiation injection scheduled for 11/11 with me - Maintenance injections scheduled for 1/11 with me - Call with any issues or questions  Alfonse Spruce, PharmD, CPP Clinical Pharmacist Practitioner [facility] for Infectious Disease

## 2021-07-23 ENCOUNTER — Other Ambulatory Visit (HOSPITAL_COMMUNITY): Payer: Self-pay

## 2021-07-24 ENCOUNTER — Telehealth: Payer: Self-pay

## 2021-07-24 NOTE — Telephone Encounter (Signed)
[facility] Patient Advocate Encounter  Patient's medication Kern Reap) have been couriered to [facility] from [facility] and will be administered on patient next office visit on 08/08/21.  Ileene Patrick , [facility] Patient [facility] for Infectious Disease Phone: [phone] Fax:  [phone]

## 2021-07-25 ENCOUNTER — Telehealth: Payer: Self-pay

## 2021-07-25 NOTE — Telephone Encounter (Signed)
Attempted to contact pt twice but ws hung up on both times. Not sure if the number is correct. Left voicemail with significant other asking for a call back from pt to [phone] regarding medication pickup.  Pt has medication ready for pickup in med room fridge.

## 2021-07-28 ENCOUNTER — Other Ambulatory Visit: Payer: Self-pay | Admitting: Internal Medicine

## 2021-07-28 DIAGNOSIS — N5201 Erectile dysfunction due to arterial insufficiency: Secondary | ICD-10-CM

## 2021-07-28 MED ORDER — TADALAFIL 5 MG PO TABS: 5.0000 mg | ORAL_TABLET | Freq: Every day | ORAL | 0 refills | Status: DC | PRN

## 2021-07-28 NOTE — Telephone Encounter (Signed)
PT called back & left voicemail  Spoke with pt. Pt says the basaglar medication we have ready at the clinic can be used as samples for our office. The medication now delivers to his home.  Pt is interested in PAP for ozempic & wants application mailed to his home.

## 2021-07-30 MED FILL — Ozempic 2 mg/dose (8 mg/3 mL) subcutaneous pen injector: 30 days supply | Qty: 3

## 2021-08-07 ENCOUNTER — Encounter: Payer: Self-pay | Admitting: Internal Medicine

## 2021-08-07 ENCOUNTER — Other Ambulatory Visit: Payer: Self-pay | Admitting: Internal Medicine

## 2021-08-07 DIAGNOSIS — Z79891 Long term (current) use of opiate analgesic: Secondary | ICD-10-CM

## 2021-08-07 MED ORDER — HYDROCODONE-ACETAMINOPHEN 7.5-325 MG PO TABS: 1.0000 | ORAL_TABLET | Freq: Four times a day (QID) | ORAL | 0 refills | Status: DC | PRN

## 2021-08-08 ENCOUNTER — Other Ambulatory Visit: Payer: Self-pay

## 2021-08-08 ENCOUNTER — Other Ambulatory Visit (HOSPITAL_COMMUNITY)
Admission: RE | Admit: 2021-08-08 | Discharge: 2021-08-08 | Disposition: A | Discharge status: Home / Self Care | Payer: Medicare HMO | Source: Ambulatory Visit | Attending: Infectious Diseases | Admitting: Infectious Diseases

## 2021-08-08 ENCOUNTER — Ambulatory Visit (INDEPENDENT_AMBULATORY_CARE_PROVIDER_SITE_OTHER): Payer: Medicare HMO | Admitting: Pharmacist

## 2021-08-08 DIAGNOSIS — B20 Human immunodeficiency virus [HIV] disease: Secondary | ICD-10-CM | POA: Diagnosis not present

## 2021-08-08 MED ORDER — CABOTEGRAVIR & RILPIVIRINE ER 600 & 900 MG/3ML IM SUER
1.0000 | Freq: Once | INTRAMUSCULAR | Status: AC
Start: 2021-08-08 — End: 2021-08-08
  Administered 2021-08-08: 1 via INTRAMUSCULAR

## 2021-08-08 NOTE — Progress Notes (Signed)
HPI: Riley Jones is a 65 y.o. male who presents to the [facility] for [place] administration.  Patient Active Problem List   Diagnosis Date Noted   Lung nodule 10/17/2020   Psoriasis 11/02/2019   Abdominal hernia 03/22/2019   GERD (gastroesophageal reflux disease) 12/01/2016   Chronic allergic rhinitis 08/25/2016   Chronic prescription opiate use 11/22/2014   CAD (coronary artery disease) 09/05/2014   Tobacco abuse 07/05/2014   Diarrhea 03/09/2014   Anxiety 12/15/2012   Preventative health care 12/15/2012   COPD (chronic obstructive pulmonary disease) ([place]) 12/04/2012   Erectile dysfunction 10/21/2011   Insomnia 08/03/2011   Rheumatoid arthritis ([place]) 02/03/2011   Hypercholesterolemia 10/11/2006   Human immunodeficiency virus (HIV) disease ([place]) 07/08/2006   Diabetes mellitus ([place]) 07/08/2006   Depression 07/08/2006   Carpal tunnel syndrome 07/08/2006   Essential hypertension 07/08/2006    Patient's Medications  New Prescriptions   No medications on file  Previous Medications   ALBUTEROL (VENTOLIN HFA) 108 (90 BASE) MCG/ACT INHALER    INHALE 1 PUFF BY MOUTH EVERY 4 HOURS AS NEEDED FOR SHORTNESS OF BREATH OR MAY USE EVERY 6 HOURS AS NEEDED   ASPIRIN 81 MG EC TABLET    Take 81 mg by mouth daily.     ATORVASTATIN (LIPITOR) 40 MG TABLET    Take 1 tablet (40 mg total) by mouth daily.   BD PEN NEEDLE NANO 2ND GEN 32G X 4 MM MISC    USE UP TO 8 PER WEEK   BETAMETHASONE DIPROPIONATE 0.05 % CREAM    Apply topically 2 (two) times daily.   CABOTEGRAVIR & RILPIVIRINE ER (CABENUVA) 600 & 900 MG/3ML INJECTION    Inject 1 kit into the muscle every 30 (thirty) days.   CABOTEGRAVIR & RILPIVIRINE ER (CABENUVA) 600 & 900 MG/3ML INJECTION    Inject 1 kit into the muscle every 2 (two) months.   CARVEDILOL (COREG) 3.125 MG TABLET    TAKE 1 TABLET BY MOUTH TWICE DAILY WITH A MEAL   CLOPIDOGREL (PLAVIX) 75 MG TABLET    Take 1 tablet by mouth once daily   EMPAGLIFLOZIN-METFORMIN HCL  ER (SYNJARDY XR) 06-999 MG TB24    Take 1 tablet by mouth daily.   FLUTICASONE (FLONASE) 50 MCG/ACT NASAL SPRAY    Place 1 spray into both nostrils daily.   HYDROCODONE-ACETAMINOPHEN (NORCO) 7.5-325 MG TABLET    Take 1 tablet by mouth every 6 (six) hours as needed for moderate pain.   INSULIN GLARGINE (BASAGLAR KWIKPEN) 100 UNIT/ML    Inject 20 Units into the skin daily.   LORATADINE (CLARITIN) 10 MG TABLET    Take 1 tablet (10 mg total) by mouth daily.   LOSARTAN-HYDROCHLOROTHIAZIDE (HYZAAR) 50-12.5 MG TABLET    Take 1 tablet by mouth once daily   MULTIPLE VITAMINS-MINERALS (CENTRUM ADULTS PO)    Take 1 tablet by mouth daily.   NITROGLYCERIN (NITROSTAT) 0.4 MG SL TABLET    PLACE 1 TABLET UNDER THE TONGUE EVERY 5 MINUTES AS NEEDED FOR CHEST PAIN   NYSTATIN (NYSTATIN) POWDER    Apply 1 application topically 2 (two) times daily.   OMEPRAZOLE (PRILOSEC) 20 MG CAPSULE    TAKE 1 CAPSULE BY MOUTH AS NEEDED   SEMAGLUTIDE, 2 MG/DOSE, (OZEMPIC, 2 MG/DOSE,) 8 MG/3ML SOPN    Inject 2 mg into the skin once a week.   TADALAFIL (CIALIS) 5 MG TABLET    Take 1 tablet (5 mg total) by mouth daily as needed for erectile dysfunction.  TERAZOSIN (HYTRIN) 2 MG CAPSULE    Take 1 capsule by mouth at bedtime  Modified Medications   No medications on file  Discontinued Medications   No medications on file    Allergies: Allergies  Allergen Reactions   Penicillins Hives, Nausea Only and Rash    Has patient had a PCN reaction causing immediate rash, facial/tongue/throat swelling, SOB or lightheadedness with hypotension: Yes Has patient had a PCN reaction causing severe rash involving mucus membranes or skin necrosis: No Has patient had a PCN reaction that required hospitalization: No Has patient had a PCN reaction occurring within the last 10 years: No If all of the above answers are "NO", then may proceed with Cephalosporin use.    Zoloft [Sertraline Hcl] Other (See Comments)    Patient reported Psychomotor  slowing / worsened depression   Victoza [Liraglutide]     Did not tolerate this medication.  Complained of dizziness even at the lowest dose   Chantix [Varenicline] Hives   Lisinopril Cough   [name] [Insulin Degludec] Rash    Past Medical History: Past Medical History:  Diagnosis Date   Anxiety    Arthritis    "pretty much all over"   Asthma    "mild" (07/17/2014)   Atypical angina ([place])    Myoview 2003, Normal EF 64%   CAD (coronary artery disease)    a. 10/20 DES to 80% mid LAD   Candida rash of groin 11/03/2019   COPD (chronic obstructive pulmonary disease) ([place])    "mild" (07/17/2014)   Depression    Fatigue 06/22/2019   Foot pain 03/30/2011   GERD (gastroesophageal reflux disease)    H/O hiatal hernia    Headache    "weekly" (06/2014)   History of gout    HIV (human immunodeficiency virus infection) ([place]) 1995   DX after shingles followed by Dr. Ola Spurr (ID)   Hx of cardiovascular stress test    ETT-Myoview (3/16):  Ex 10:15, No Ischemia, EF 57%; Normal Study   Hyperlipidemia    Hypertension    Well Controlled off meds   Increased anion gap metabolic acidosis 03/11/4314   Kidney stones    "passed them all"   Left lower quadrant abdominal pain 11/02/2019   Other and unspecified angina pectoris 12/18/2008   Overview:  Overview:  Greatly appreciate PCP f/u.   Last Assessment & Plan:  - Patient with episode of CP - 4 days, constant, substernal, not immediately resolved with NTG, felt better after burping and passing gas - Repeat EKG done here with no acute ST/T wave changes - CP now completely resolved. Encouraged patient to call 911 if recurs - To f/u with cardio in the next 2 weeks for further assess   Pneumonia 4-5 times   Rheumatoid arthritis(714.0)    Followed by Dr. Charlestine Night, off MTX and prdnisone since 2007   Right hip pain 04/25/2019   Right shoulder pain 01/19/2018   Shingles (herpes zoster) polyneuropathy 11/08/2018   Sleep apnea    does not wear mask (07/17/2014)    Type II diabetes mellitus ([place])     Social History: Social History   Socioeconomic History   Marital status: Single    Spouse name: Not on file   Number of children: Not on file   Years of education: 13   Highest education level: Not on file  Occupational History   Occupation: [employer]   Occupation: Administrator (retired)  Tobacco Use   Smoking status: Every Day    Packs/day:  1.00    Years: 46.00    Pack years: 46.00    Types: Cigarettes   Smokeless tobacco: Never  Vaping Use   Vaping Use: Never used  Substance and Sexual Activity   Alcohol use: Not Currently    Alcohol/week: 1.0 standard drink    Types: 1 Glasses of wine per week    Comment: 07/17/2014 "glass of wine once or twice/month"   Drug use: No   Sexual activity: Yes    Partners: Male    Comment: declined condoms 08/2019  Other Topics Concern   Not on file  Social History Narrative   [place] approved beginning 12/11/2009 - 12/27/2010   [place] benefits approved; patient eligible for 100% discount for out patient labs and office visits. Patient eligible for 70% discount for other services per Irish Elders 09/08/2010      Patient is on disability, and walks 2-3 times per week.    Social Determinants of Health   Financial Resource Strain: Not on file  Food Insecurity: Not on file  Transportation Needs: Not on file  Physical Activity: Not on file  Stress: Not on file  Social Connections: Not on file    Labs: Lab Results  Component Value Date   HIV1RNAQUANT <20 (H) 05/16/2021   HIV1RNAQUANT <20 (H) 06/11/2020   HIV1RNAQUANT <20 DETECTED (A) 08/09/2019   CD4TABS 848 06/11/2020   CD4TABS 615 08/09/2019   CD4TABS 760 10/25/2018    RPR and STI Lab Results  Component Value Date   LABRPR NON-REACTIVE 05/16/2021   LABRPR NON-REACTIVE 06/11/2020   LABRPR NON-REACTIVE 10/25/2018   LABRPR NON-REACTIVE 01/19/2018   LABRPR NON-REACTIVE 06/16/2017    STI Results GC CT  05/16/2021 Negative Negative   06/11/2020 Negative Negative  10/25/2018 Negative Negative  01/19/2018 Negative Negative  06/16/2017 Negative Negative  03/02/2016 Negative Negative    Hepatitis B Lab Results  Component Value Date   HEPBSAB YES 11/22/2006   HEPBSAG NO 11/22/2006   Hepatitis C No results found for: HEPCAB, HCVRNAPCRQN Hepatitis A No results found for: HAV Lipids: Lab Results  Component Value Date   CHOL 129 05/16/2021   TRIG 246 (H) 05/16/2021   HDL 38 (L) 05/16/2021   CHOLHDL 3.4 05/16/2021   VLDL UNABLE TO CALCULATE IF TRIGLYCERIDE OVER 400 mg/dL 02/26/2017   LDLCALC 60 05/16/2021    TARGET DATE:  The 11th of the month  Current HIV Regimen: Cabenuva  Assessment: Isaul presents today for their maintenance Cabenuva injections. Initial/past injections were tolerated well without issues. No problems with systemic effects of injections. Patient did experience soreness over 3 to 4 days then it subsided. He said that ibuprofen gave some relief. He tried to stay up and moving to minimize pain.   Administered cabotegravir 624m/3mL in left upper outer quadrant of the gluteal muscle. Administered rilpivirine 900 mg/337min the right upper outer quadrant of the gluteal muscle. Monitored patient for 10 minutes after injection. Injections were tolerated well without issue. Patient will follow up in 2 months for next injection.  Plan: - Cabenuva injections administered - Next injections scheduled for 10/08/21 @ 9:15 - Call with any issues or questions  AlLestine BoxPharmD PGY2 Infectious Diseases Pharmacy Resident

## 2021-08-10 ENCOUNTER — Other Ambulatory Visit: Payer: Self-pay | Admitting: Internal Medicine

## 2021-08-10 DIAGNOSIS — J42 Unspecified chronic bronchitis: Secondary | ICD-10-CM

## 2021-08-10 LAB — HIV-1 RNA QUANT-NO REFLEX-BLD
HIV 1 RNA Quant: <20 Copies/mL — ABNORMAL HIGH
HIV-1 RNA Quant, Log: <1.3 Log cps/mL — ABNORMAL HIGH

## 2021-08-11 ENCOUNTER — Other Ambulatory Visit (HOSPITAL_COMMUNITY): Payer: Self-pay

## 2021-08-11 LAB — URINE CYTOLOGY ANCILLARY ONLY
Chlamydia: NEGATIVE
Comment: NEGATIVE
Comment: NORMAL
Neisseria Gonorrhea: NEGATIVE

## 2021-08-11 MED ORDER — ALBUTEROL SULFATE HFA 108 (90 BASE) MCG/ACT IN AERS: 2.0000 | INHALATION_SPRAY | Freq: Four times a day (QID) | RESPIRATORY_TRACT | 0 refills | Status: DC | PRN

## 2021-08-14 ENCOUNTER — Other Ambulatory Visit: Payer: Self-pay | Admitting: Internal Medicine

## 2021-08-14 DIAGNOSIS — I10 Essential (primary) hypertension: Secondary | ICD-10-CM

## 2021-08-15 MED ORDER — CARVEDILOL 3.125 MG PO TABS: 3.1250 mg | ORAL_TABLET | Freq: Two times a day (BID) | ORAL | 0 refills | Status: DC

## 2021-09-05 ENCOUNTER — Telehealth: Payer: Self-pay | Admitting: *Deleted

## 2021-09-05 ENCOUNTER — Encounter: Payer: Self-pay | Admitting: Internal Medicine

## 2021-09-05 ENCOUNTER — Ambulatory Visit (INDEPENDENT_AMBULATORY_CARE_PROVIDER_SITE_OTHER): Payer: Medicare HMO | Admitting: Student

## 2021-09-05 ENCOUNTER — Other Ambulatory Visit: Payer: Self-pay

## 2021-09-05 DIAGNOSIS — K219 Gastro-esophageal reflux disease without esophagitis: Secondary | ICD-10-CM | POA: Diagnosis not present

## 2021-09-05 MED ORDER — FAMOTIDINE 20 MG PO TABS: 20.0000 mg | ORAL_TABLET | Freq: Two times a day (BID) | ORAL | 0 refills | Status: DC

## 2021-09-05 NOTE — Assessment & Plan Note (Addendum)
Patient endorses symptoms of sore throat, nausea, dizziness, diarrhea that started back in October.  He was seen by Dr. Dareen Piano in October but did not report the symptoms.  States that his symptoms comes and goes.  He denies any known sick contact however he is working in [place] with potential exposure.   He denies fever, chills, shortness of breath, wheezing.  He endorses coughing with clear sputum production.  Also endorses lightheadedness without syncope.  States that he has an episode of severe dyspepsia last night that caused him to vomit.  He is taking omeprazole 20 mg daily for his GERD and states that his symptoms has been bad lately.  He was seen in October several weeks of diarrhea however his diarrhea has improved significantly after that visit.  He is taking Synjardy daily.  [facility] and a negative home COVID test today.  Assessment and plan Given the long duration of his symptoms, infectious causes unlikely.  Patient reports that he was on long-term Biktarvy and was recently switched to a new injection [place] in October.  He received the flu shot in October, the second shot last month and the third shot in January.  The timeline of his symptoms and initiation of Cabenuva are consistent.  Majority of his symptoms can be explained by Cabenuva's side effects including dyspepsia, nausea, vomiting.  Thinking that he could have worsening GERD causing esophagitis, sore throat, and coughing.  Low suspicion for respiratory source at this time.  Will add Pepcid 20 mg twice daily on top of his omeprazole.  Advised patient to take Tums or Maalox as needed for his symptoms.  Advised him to reach out to his infectious provider regarding his symptoms after starting [place].  I will also message the infectious office as well.  Patient will call us if his symptoms do not get better.  We will also make a follow-up appointment for him.

## 2021-09-05 NOTE — Telephone Encounter (Signed)
RTC to patient c/o not feeling well for a few weeks. Sore throat x 1 month.  Cough clear mucous x 1 month.  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  Flu test was done and negative yesterday.  Covid Test this am was negative as well.  Temp this am was 98.3.  Has not been running fevers.  Feels tired.  Had a little Chest discomfort  last night took Pepto and then vomited and felt better.  No chest discomfort now. Thinks it was from the coughing. Patient scheduled for TeleHealth call this PM.  Told to expect call between 1:15 PM and 3 PM today.

## 2021-09-05 NOTE — Progress Notes (Signed)
  [facility] Internal Medicine Residency Telephone Encounter Continuity Care Appointment  HPI:  This telephone encounter was created for [name] on 09/05/2021 for the following purpose/cc sore throat, N/V.   Past Medical History:  Past Medical History:  Diagnosis Date   Anxiety    Arthritis    "pretty much all over"   Asthma    "mild" (07/17/2014)   Atypical angina ([place])    Myoview 2003, Normal EF 64%   CAD (coronary artery disease)    a. 10/20 DES to 80% mid LAD   Candida rash of groin 11/03/2019   COPD (chronic obstructive pulmonary disease) ([place])    "mild" (07/17/2014)   Depression    Fatigue 06/22/2019   Foot pain 03/30/2011   GERD (gastroesophageal reflux disease)    H/O hiatal hernia    Headache    "weekly" (06/2014)   History of gout    HIV (human immunodeficiency virus infection) ([place]) 1995   DX after shingles followed by Dr. Ola Spurr (ID)   Hx of cardiovascular stress test    ETT-Myoview (3/16):  Ex 10:15, No Ischemia, EF 57%; Normal Study   Hyperlipidemia    Hypertension    Well Controlled off meds   Increased anion gap metabolic acidosis 10/28/8655   Kidney stones    "passed them all"   Left lower quadrant abdominal pain 11/02/2019   Other and unspecified angina pectoris 12/18/2008   Overview:  Overview:  Greatly appreciate PCP f/u.   Last Assessment & Plan:  - Patient with episode of CP - 4 days, constant, substernal, not immediately resolved with NTG, felt better after burping and passing gas - Repeat EKG done here with no acute ST/T wave changes - CP now completely resolved. Encouraged patient to call 911 if recurs - To f/u with cardio in the next 2 weeks for further assess   Pneumonia 4-5 times   Rheumatoid arthritis(714.0)    Followed by Dr. Charlestine Night, off MTX and prdnisone since 2007   Right hip pain 04/25/2019   Right shoulder pain 01/19/2018   Shingles (herpes zoster) polyneuropathy 11/08/2018   Sleep apnea    does not wear mask (07/17/2014)   Type  II diabetes mellitus (HCC)      ROS:  Positive: Diarrhea, dyspepsia, nausea, vomiting, coughing with clear sputum production, lightheaded Negative: Fever, chills, shortness of breath, wheezing   Assessment / Plan / Recommendations:  Please see A&P under problem oriented charting for assessment of the patient's acute and chronic medical conditions.  As always, pt is advised that if symptoms worsen or new symptoms arise, they should go to an urgent care facility or to to ER for further evaluation.   Consent and Medical Decision Making:  Patient discussed with Dr. Philipp Ovens This is a telephone encounter between [name] and Gaylan Gerold on 09/05/2021 for sore throat, N/V. The visit was conducted with the patient located at home and Gaylan Gerold at [facility]. The patient's identity was confirmed using their DOB and current address. The patient has consented to being evaluated through a telephone encounter and understands the associated risks (an examination cannot be done and the patient may need to come in for an appointment) / benefits (allows the patient to remain at home, decreasing exposure to coronavirus). I personally spent 20 minutes on medical discussion.

## 2021-09-06 DIAGNOSIS — Z03818 Encounter for observation for suspected exposure to other biological agents ruled out: Secondary | ICD-10-CM | POA: Diagnosis not present

## 2021-09-06 DIAGNOSIS — Z20822 Contact with and (suspected) exposure to covid-19: Secondary | ICD-10-CM | POA: Diagnosis not present

## 2021-09-06 DIAGNOSIS — J0140 Acute pansinusitis, unspecified: Secondary | ICD-10-CM | POA: Diagnosis not present

## 2021-09-09 NOTE — Addendum Note (Signed)
Addended byGaylan Gerold on: 09/09/2021 08:37 AM   Modules accepted: Level of Service

## 2021-09-11 ENCOUNTER — Other Ambulatory Visit: Payer: Self-pay | Admitting: Internal Medicine

## 2021-09-11 DIAGNOSIS — N5201 Erectile dysfunction due to arterial insufficiency: Secondary | ICD-10-CM

## 2021-09-11 DIAGNOSIS — Z79891 Long term (current) use of opiate analgesic: Secondary | ICD-10-CM

## 2021-09-11 MED ORDER — HYDROCODONE-ACETAMINOPHEN 7.5-325 MG PO TABS: 1.0000 | ORAL_TABLET | Freq: Four times a day (QID) | ORAL | 0 refills | Status: DC | PRN

## 2021-09-11 MED ORDER — TADALAFIL 5 MG PO TABS: 5.0000 mg | ORAL_TABLET | Freq: Every day | ORAL | 0 refills | Status: DC | PRN

## 2021-09-11 NOTE — Telephone Encounter (Signed)
Last appointment 09/05/2021  NOV 11/25/2021 Last Tox [facility]ure 07/01/2021

## 2021-09-16 ENCOUNTER — Telehealth: Payer: Self-pay

## 2021-09-16 ENCOUNTER — Other Ambulatory Visit (HOSPITAL_COMMUNITY): Payer: Self-pay

## 2021-09-16 NOTE — Telephone Encounter (Signed)
[facility] Patient Advocate Encounter  Patient's medication (Cabenuva)have been couriered to [facility] from [place] and will be administered on the patient next office visit on 10/08/21.  Ileene Patrick , [facility] Patient [facility] for Infectious Disease Phone: [phone] Fax:  [phone]

## 2021-09-23 DIAGNOSIS — E1165 Type 2 diabetes mellitus with hyperglycemia: Secondary | ICD-10-CM | POA: Diagnosis not present

## 2021-09-23 NOTE — Progress Notes (Signed)
Internal Medicine Clinic Attending  Case discussed with Dr. Nguyen  At the time of the visit.  We reviewed the resident's history and exam and pertinent patient test results.  I agree with the assessment, diagnosis, and plan of care documented in the resident's note. 

## 2021-09-25 ENCOUNTER — Other Ambulatory Visit: Payer: Self-pay | Admitting: Internal Medicine

## 2021-09-29 ENCOUNTER — Other Ambulatory Visit: Payer: Self-pay | Admitting: Internal Medicine

## 2021-09-29 DIAGNOSIS — J42 Unspecified chronic bronchitis: Secondary | ICD-10-CM

## 2021-09-30 ENCOUNTER — Encounter: Payer: Medicare HMO | Admitting: Internal Medicine

## 2021-09-30 MED ORDER — ALBUTEROL SULFATE HFA 108 (90 BASE) MCG/ACT IN AERS: 2.0000 | INHALATION_SPRAY | Freq: Four times a day (QID) | RESPIRATORY_TRACT | 3 refills | Status: DC | PRN

## 2021-09-30 NOTE — Progress Notes (Deleted)
° °  CC: dyspepsia f/u  HPI:[name] is a 66 y.o. male who presents for evaluation of ***. Please see individual problem based A/P for details.  GERD - recently switched from [place] to [place], ? Cabenuva side effect - started on Pepcid 20mg  BID, continued Omeprazole, Maalox and Tums PRN: controlling symptoms? - talk with ID physician? - if not controlled, test for h pylori    Depression, PHQ-9: Based on the patients  [place] Office Visit from 07/01/2021 in [place]  PHQ-9 Total Score 4      score we have ***.  Past Medical History:  Diagnosis Date   Anxiety    Arthritis    "pretty much all over"   Asthma    "mild" (07/17/2014)   Atypical angina ([place])    Myoview 2003, Normal EF 64%   CAD (coronary artery disease)    a. 10/20 DES to 80% mid LAD   Candida rash of groin 11/03/2019   COPD (chronic obstructive pulmonary disease) ([place])    "mild" (07/17/2014)   Depression    Fatigue 06/22/2019   Foot pain 03/30/2011   GERD (gastroesophageal reflux disease)    H/O hiatal hernia    Headache    "weekly" (06/2014)   History of gout    HIV (human immunodeficiency virus infection) ([place]) 1995   DX after shingles followed by Dr. Ola Spurr (ID)   Hx of cardiovascular stress test    ETT-Myoview (3/16):  Ex 10:15, No Ischemia, EF 57%; Normal Study   Hyperlipidemia    Hypertension    Well Controlled off meds   Increased anion gap metabolic acidosis 9/53/2023   Kidney stones    "passed them all"   Left lower quadrant abdominal pain 11/02/2019   Other and unspecified angina pectoris 12/18/2008   Overview:  Overview:  Greatly appreciate PCP f/u.   Last Assessment & Plan:  - Patient with episode of CP - 4 days, constant, substernal, not immediately resolved with NTG, felt better after burping and passing gas - Repeat EKG done here with no acute ST/T wave changes - CP now completely resolved. Encouraged patient to call 911 if recurs - To f/u with  cardio in the next 2 weeks for further assess   Pneumonia 4-5 times   Rheumatoid arthritis(714.0)    Followed by Dr. Charlestine Night, off MTX and prdnisone since 2007   Right hip pain 04/25/2019   Right shoulder pain 01/19/2018   Shingles (herpes zoster) polyneuropathy 11/08/2018   Sleep apnea    does not wear mask (07/17/2014)   Type II diabetes mellitus ([place])    Review of Systems:   ROS   Physical Exam: There were no vitals filed for this visit.   General: *** HEENT: Conjunctiva nl , antiicteric sclerae, moist mucous membranes, no exudate or erythema Cardiovascular: Normal rate, regular rhythm.  No murmurs, rubs, or gallops Pulmonary : Equal breath sounds, No wheezes, rales, or rhonchi Abdominal: soft, nontender,  bowel sounds present Ext: No edema in lower extremities, no tenderness to palpation of lower extremities.   Assessment & Plan:   See Encounters Tab for problem based charting.  Patient {GC/GE:[id number]::"discussed with","seen with"} Dr. {[id number]::"G. Hoffman","Guilloud","Mullen","Narendra","Raines","Vincent","Williams"}

## 2021-10-01 ENCOUNTER — Other Ambulatory Visit: Payer: Self-pay

## 2021-10-01 ENCOUNTER — Ambulatory Visit (INDEPENDENT_AMBULATORY_CARE_PROVIDER_SITE_OTHER): Payer: Medicare HMO | Admitting: Internal Medicine

## 2021-10-01 ENCOUNTER — Encounter: Payer: Self-pay | Admitting: Internal Medicine

## 2021-10-01 VITALS — BP 136/63 | HR 61 | Temp 97.8°F | Resp 24 | Ht 70.0 in | Wt 191.3 lb

## 2021-10-01 DIAGNOSIS — E1169 Type 2 diabetes mellitus with other specified complication: Secondary | ICD-10-CM | POA: Diagnosis not present

## 2021-10-01 DIAGNOSIS — J309 Allergic rhinitis, unspecified: Secondary | ICD-10-CM

## 2021-10-01 DIAGNOSIS — K219 Gastro-esophageal reflux disease without esophagitis: Secondary | ICD-10-CM | POA: Diagnosis not present

## 2021-10-01 DIAGNOSIS — I10 Essential (primary) hypertension: Secondary | ICD-10-CM | POA: Diagnosis not present

## 2021-10-01 DIAGNOSIS — Z794 Long term (current) use of insulin: Secondary | ICD-10-CM | POA: Diagnosis not present

## 2021-10-01 LAB — POCT GLYCOSYLATED HEMOGLOBIN (HGB A1C): Hemoglobin A1C: 9.3 % — AB (ref 4.0–5.6)

## 2021-10-01 LAB — GLUCOSE, CAPILLARY: Glucose-Capillary: 176 mg/dL — ABNORMAL HIGH (ref 70–99)

## 2021-10-01 MED ORDER — AZELASTINE-FLUTICASONE 137-50 MCG/ACT NA SUSP: 1.0000 | Freq: Two times a day (BID) | NASAL | 0 refills | Status: DC

## 2021-10-01 NOTE — Progress Notes (Addendum)
° °  CC: follow up from telehealth  HPI:[name] is a 66 y.o. male who presents for evaluation of telehealth follow up. Please see individual problem based A/P for details.  Depression, PHQ-9: Based on the patients  [place] Visit from 10/01/2021 in [place]  PHQ-9 Total Score 5      score we have 5.  Past Medical History:  Diagnosis Date   Anxiety    Arthritis    "pretty much all over"   Asthma    "mild" (07/17/2014)   Atypical angina ([place])    Myoview 2003, Normal EF 64%   CAD (coronary artery disease)    a. 10/20 DES to 80% mid LAD   Candida rash of groin 11/03/2019   COPD (chronic obstructive pulmonary disease) ([place])    "mild" (07/17/2014)   Depression    Fatigue 06/22/2019   Foot pain 03/30/2011   GERD (gastroesophageal reflux disease)    H/O hiatal hernia    Headache    "weekly" (06/2014)   History of gout    HIV (human immunodeficiency virus infection) ([place]) 1995   DX after shingles followed by Dr. Ola Spurr (ID)   Hx of cardiovascular stress test    ETT-Myoview (3/16):  Ex 10:15, No Ischemia, EF 57%; Normal Study   Hyperlipidemia    Hypertension    Well Controlled off meds   Increased anion gap metabolic acidosis 03/25/3150   Kidney stones    "passed them all"   Left lower quadrant abdominal pain 11/02/2019   Other and unspecified angina pectoris 12/18/2008   Overview:  Overview:  Greatly appreciate PCP f/u.   Last Assessment & Plan:  - Patient with episode of CP - 4 days, constant, substernal, not immediately resolved with NTG, felt better after burping and passing gas - Repeat EKG done here with no acute ST/T wave changes - CP now completely resolved. Encouraged patient to call 911 if recurs - To f/u with cardio in the next 2 weeks for further assess   Pneumonia 4-5 times   Rheumatoid arthritis(714.0)    Followed by Dr. Charlestine Night, off MTX and prdnisone since 2007   Right hip pain 04/25/2019   Right shoulder pain 01/19/2018    Shingles (herpes zoster) polyneuropathy 11/08/2018   Sleep apnea    does not wear mask (07/17/2014)   Type II diabetes mellitus ([place])    Review of Systems:   ROS   Physical Exam: Vitals:   10/01/21 1542 10/01/21 1654  BP: (!) 148/74 136/63  Pulse: 73 61  Resp: (!) 24   Temp: 97.8 F (36.6 C)   TempSrc: Oral   SpO2: 99%   Weight: 191 lb 4.8 oz (86.8 kg)   Height: 5\' 10"  (1.778 m)      General: alert and oriented, no acute distress HEENT: Conjunctiva nl , antiicteric sclerae, moist mucous membranes, no exudate or erythema Cardiovascular: Normal rate, regular rhythm.  No murmurs, rubs, or gallops Pulmonary : Equal breath sounds, No wheezes, rales, or rhonchi Abdominal: soft, nontender,  bowel sounds present Ext: No edema in lower extremities, no tenderness to palpation of lower extremities.   Assessment & Plan:   See Encounters Tab for problem based charting.  Patient discussed with Dr. Jimmye Norman

## 2021-10-01 NOTE — Patient Instructions (Addendum)
Dear Mr. Ranum,   Thank you for trusting [place] with your care today.   We evaluated you for sinus pain, nasal discharge, and cough. We have sent in a prescription for azelastin-fluticasone to help with your symptoms.  We will work to figure out an alternative for [employer]. Please continue to take your insulins as previously prescribed with close monitoring of your diet and blood sugar.  Please return to the clinic for your appointment with Dr. Dareen Piano.

## 2021-10-04 ENCOUNTER — Encounter: Payer: Self-pay | Admitting: Internal Medicine

## 2021-10-04 NOTE — Assessment & Plan Note (Signed)
Pt did not follow up with ID provider regarding GI symptoms. However, his symptoms have now resolved.  Continue symptomatic control as needed. Continue to monitor.

## 2021-10-04 NOTE — Assessment & Plan Note (Signed)
Reports poor compliance with dietary discretion over holidays. Furthermore, he repors that he did not fill his prescription for Ozempic stating that it would cost him $300 and that he could not afford that. Reports compliance with synjardy/  A1c elevated 9.3 today. Encouraged patient to continue taking Synjardy as prescribed and to closely monitor diet.

## 2021-10-04 NOTE — Assessment & Plan Note (Signed)
BP 614'J systolic initially. States hehad to walk significant distance to get to clinic. Also drinks significant amount of coffee daily.  136/63 on recheck.  Continue on carvedilol 3.125 and hyzaar 50-12.5

## 2021-10-04 NOTE — Assessment & Plan Note (Signed)
[facility] and given Zpack which resolved symptoms briefly. Flu and covid negative.  Endorses fullness in ears with sinus pressure. Clear phlegm. Itchy watery eyes. Has tried Flonase and Zyrtec with no relief.  No fevers.   Will prescribe azelastin-fluticasone. He may benefit from allergist referral but defers for now.

## 2021-10-07 ENCOUNTER — Ambulatory Visit (INDEPENDENT_AMBULATORY_CARE_PROVIDER_SITE_OTHER): Payer: Medicare HMO | Admitting: Pharmacist

## 2021-10-07 ENCOUNTER — Other Ambulatory Visit: Payer: Self-pay

## 2021-10-07 DIAGNOSIS — B20 Human immunodeficiency virus [HIV] disease: Secondary | ICD-10-CM | POA: Diagnosis not present

## 2021-10-07 MED ORDER — CABOTEGRAVIR & RILPIVIRINE ER 600 & 900 MG/3ML IM SUER: 1.0000 | Freq: Once | INTRAMUSCULAR | Status: DC

## 2021-10-07 MED ORDER — CABOTEGRAVIR & RILPIVIRINE ER 600 & 900 MG/3ML IM SUER
1.0000 | Freq: Once | INTRAMUSCULAR | Status: AC
  Administered 2021-10-07: 1 via INTRAMUSCULAR

## 2021-10-07 NOTE — Progress Notes (Signed)
HPI: Riley Jones is a 66 y.o. male who presents to the [facility] for [place] administration.  Patient Active Problem List   Diagnosis Date Noted   Lung nodule 10/17/2020   Psoriasis 11/02/2019   Abdominal hernia 03/22/2019   GERD (gastroesophageal reflux disease) 12/01/2016   Chronic allergic rhinitis 08/25/2016   Chronic prescription opiate use 11/22/2014   CAD (coronary artery disease) 09/05/2014   Tobacco abuse 07/05/2014   Diarrhea 03/09/2014   Anxiety 12/15/2012   Preventative health care 12/15/2012   COPD (chronic obstructive pulmonary disease) ([place]) 12/04/2012   Erectile dysfunction 10/21/2011   Insomnia 08/03/2011   Rheumatoid arthritis ([place]) 02/03/2011   Hypercholesterolemia 10/11/2006   Human immunodeficiency virus (HIV) disease ([place]) 07/08/2006   Diabetes mellitus ([place]) 07/08/2006   Depression 07/08/2006   Carpal tunnel syndrome 07/08/2006   Essential hypertension 07/08/2006    Patient's Medications  New Prescriptions   No medications on file  Previous Medications   ALBUTEROL (VENTOLIN HFA) 108 (90 BASE) MCG/ACT INHALER    Inhale 2 puffs into the lungs every 6 (six) hours as needed for wheezing or shortness of breath.   ASPIRIN 81 MG EC TABLET    Take 81 mg by mouth daily.     ATORVASTATIN (LIPITOR) 40 MG TABLET    Take 1 tablet (40 mg total) by mouth daily.   AZELASTINE-FLUTICASONE 137-50 MCG/ACT SUSP    Place 1 spray into the nose 2 (two) times daily.   BD PEN NEEDLE NANO 2ND GEN 32G X 4 MM MISC    USE UP TO 8 PER WEEK   BETAMETHASONE DIPROPIONATE 0.05 % CREAM    Apply topically 2 (two) times daily.   CABOTEGRAVIR & RILPIVIRINE ER (CABENUVA) 600 & 900 MG/3ML INJECTION    Inject 1 kit into the muscle every 30 (thirty) days.   CABOTEGRAVIR & RILPIVIRINE ER (CABENUVA) 600 & 900 MG/3ML INJECTION    Inject 1 kit into the muscle every 2 (two) months.   CARVEDILOL (COREG) 3.125 MG TABLET    Take 1 tablet (3.125 mg total) by mouth 2 (two) times daily  with a meal.   CLOPIDOGREL (PLAVIX) 75 MG TABLET    Take 1 tablet by mouth once daily   EMPAGLIFLOZIN-METFORMIN HCL ER (SYNJARDY XR) 06-999 MG TB24    Take 1 tablet by mouth daily.   FAMOTIDINE (PEPCID) 20 MG TABLET    Take 1 tablet (20 mg total) by mouth 2 (two) times daily for 14 days.   FLUTICASONE (FLONASE) 50 MCG/ACT NASAL SPRAY    Place 1 spray into both nostrils daily.   HYDROCODONE-ACETAMINOPHEN (NORCO) 7.5-325 MG TABLET    Take 1 tablet by mouth every 6 (six) hours as needed for moderate pain.   INSULIN GLARGINE (BASAGLAR KWIKPEN) 100 UNIT/ML    Inject 20 Units into the skin daily.   LORATADINE (CLARITIN) 10 MG TABLET    Take 1 tablet (10 mg total) by mouth daily.   LOSARTAN-HYDROCHLOROTHIAZIDE (HYZAAR) 50-12.5 MG TABLET    Take 1 tablet by mouth once daily   MULTIPLE VITAMINS-MINERALS (CENTRUM ADULTS PO)    Take 1 tablet by mouth daily.   NITROGLYCERIN (NITROSTAT) 0.4 MG SL TABLET    PLACE 1 TABLET UNDER THE TONGUE EVERY 5 MINUTES AS NEEDED FOR CHEST PAIN   NYSTATIN (NYSTATIN) POWDER    Apply 1 application topically 2 (two) times daily.   OMEPRAZOLE (PRILOSEC) 20 MG CAPSULE    TAKE 1 CAPSULE BY MOUTH AS NEEDED   OZEMPIC,  2 MG/DOSE, 8 MG/3ML SOPN    INJECT 2 MG INTO THE SKIN ONCE A WEEK.   TADALAFIL (CIALIS) 5 MG TABLET    Take 1 tablet (5 mg total) by mouth daily as needed for erectile dysfunction.   TERAZOSIN (HYTRIN) 2 MG CAPSULE    Take 1 capsule by mouth at bedtime  Modified Medications   No medications on file  Discontinued Medications   No medications on file    Allergies: Allergies  Allergen Reactions   Penicillins Hives, Nausea Only and Rash    Has patient had a PCN reaction causing immediate rash, facial/tongue/throat swelling, SOB or lightheadedness with hypotension: Yes Has patient had a PCN reaction causing severe rash involving mucus membranes or skin necrosis: No Has patient had a PCN reaction that required hospitalization: No Has patient had a PCN reaction  occurring within the last 10 years: No If all of the above answers are "NO", then may proceed with Cephalosporin use.    Zoloft [Sertraline Hcl] Other (See Comments)    Patient reported Psychomotor slowing / worsened depression   Victoza [Liraglutide]     Did not tolerate this medication.  Complained of dizziness even at the lowest dose   Chantix [Varenicline] Hives   Lisinopril Cough   [name] [Insulin Degludec] Rash    Past Medical History: Past Medical History:  Diagnosis Date   Anxiety    Arthritis    "pretty much all over"   Asthma    "mild" (07/17/2014)   Atypical angina ([place])    Myoview 2003, Normal EF 64%   CAD (coronary artery disease)    a. 10/20 DES to 80% mid LAD   [place]ida rash of groin 11/03/2019   COPD (chronic obstructive pulmonary disease) ([place])    "mild" (07/17/2014)   Depression    Fatigue 06/22/2019   Foot pain 03/30/2011   GERD (gastroesophageal reflux disease)    H/O hiatal hernia    Headache    "weekly" (06/2014)   History of gout    HIV (human immunodeficiency virus infection) ([place]) 1995   DX after shingles followed by Dr. Ola Spurr (ID)   Hx of cardiovascular stress test    ETT-Myoview (3/16):  Ex 10:15, No Ischemia, EF 57%; Normal Study   Hyperlipidemia    Hypertension    Well Controlled off meds   Increased anion gap metabolic acidosis 0/86/5784   Kidney stones    "passed them all"   Left lower quadrant abdominal pain 11/02/2019   Other and unspecified angina pectoris 12/18/2008   Overview:  Overview:  Greatly appreciate PCP f/u.   Last Assessment & Plan:  - Patient with episode of CP - 4 days, constant, substernal, not immediately resolved with NTG, felt better after burping and passing gas - Repeat EKG done here with no acute ST/T wave changes - CP now completely resolved. Encouraged patient to call 911 if recurs - To f/u with cardio in the next 2 weeks for further assess   Pneumonia 4-5 times   Rheumatoid arthritis(714.0)    Followed by Dr.  Charlestine Night, off MTX and prdnisone since 2007   Right hip pain 04/25/2019   Right shoulder pain 01/19/2018   Shingles (herpes zoster) polyneuropathy 11/08/2018   Sleep apnea    does not wear mask (07/17/2014)   Type II diabetes mellitus ([place])     Social History: Social History   Socioeconomic History   Marital status: Single    Spouse name: Not on file   Number of children:  Not on file   Years of education: 13   Highest education level: Not on file  Occupational History   Occupation: [employer]   Occupation: Administrator (retired)  Tobacco Use   Smoking status: Every Day    Packs/day: 1.00    Years: 46.00    Pack years: 46.00    Types: Cigarettes   Smokeless tobacco: Never  Vaping Use   Vaping Use: Never used  Substance and Sexual Activity   Alcohol use: Not Currently    Alcohol/week: 1.0 standard drink    Types: 1 Glasses of wine per week    Comment: 07/17/2014 "glass of wine once or twice/month"   Drug use: No   Sexual activity: Yes    Partners: Male    Comment: declined condoms 08/2019  Other Topics Concern   Not on file  Social History Narrative   [employer] approved beginning 12/11/2009 - 12/27/2010   [employer] [place] benefits approved; patient eligible for 100% discount for out patient labs and office visits. Patient eligible for 70% discount for other services per [employer] 09/08/2010      Patient is on disability, and walks 2-3 times per week.    Social Determinants of Health   Financial Resource Strain: Not on file  Food Insecurity: Not on file  Transportation Needs: Not on file  Physical Activity: Not on file  Stress: Not on file  Social Connections: Not on file    Labs: Lab Results  Component Value Date   HIV1RNAQUANT <20 (H) 08/08/2021   HIV1RNAQUANT <20 (H) 05/16/2021   HIV1RNAQUANT <20 (H) 06/11/2020   CD4TABS 848 06/11/2020   CD4TABS 615 08/09/2019   CD4TABS 760 10/25/2018    RPR and STI Lab Results  Component Value Date   LABRPR NON-REACTIVE  05/16/2021   LABRPR NON-REACTIVE 06/11/2020   LABRPR NON-REACTIVE 10/25/2018   LABRPR NON-REACTIVE 01/19/2018   LABRPR NON-REACTIVE 06/16/2017    STI Results GC CT  08/08/2021 Negative Negative  05/16/2021 Negative Negative  06/11/2020 Negative Negative  10/25/2018 Negative Negative  01/19/2018 Negative Negative  06/16/2017 Negative Negative  03/02/2016 Negative Negative    Hepatitis B Lab Results  Component Value Date   HEPBSAB YES 11/22/2006   HEPBSAG NO 11/22/2006   Hepatitis C No results found for: HEPCAB, HCVRNAPCRQN Hepatitis A No results found for: HAV Lipids: Lab Results  Component Value Date   CHOL 129 05/16/2021   TRIG 246 (H) 05/16/2021   HDL 38 (L) 05/16/2021   CHOLHDL 3.4 05/16/2021   VLDL UNABLE TO CALCULATE IF TRIGLYCERIDE OVER 400 mg/dL 02/26/2017   LDLCALC 60 05/16/2021    TARGET DATE:  The 11th of the month  Current HIV Regimen: Cabenuva  Assessment: Riley Jones presents today for their maintenance Cabenuva injections. Initial/past injections were tolerated well without issues. No problems with systemic effects of injections. His internal medicine team was considered about possible side effects from [place] which included dyspepsia, nausea, and cough since October. These symptoms have resolved. He also mentioned that he has experienced itchiness around the injection sites, but upon examination no rash is noted. The itchiness is most likely from dry skin; patient has been getting better at moisturizing. Riley Jones agreed to receive his [place] shot today but would like to think about switching to orals again. He said the injections seem inconvenient. Scheduled his next appointment with Dr. Johnnye Sima around his injection target date. Discussed that it is up to him whether he wants to continue [place] and that we can certainly continue the injections  at that visit if he wishes. Otherwise, he can switch back to orals when he sees Dr. Johnnye Sima. Went ahead and scheduled his next  injection with me after Dr. Algis Downs visit should he wish to continue. Will also check HIV RNA today.  Administered cabotegravir 611m/3mL in left upper outer quadrant of the gluteal muscle. Administered rilpivirine 900 mg/386min the right upper outer quadrant of the gluteal muscle. Monitored patient for 10 minutes after injection. Injections were tolerated well without issue. Patient will follow up in 2 months for next injection.  Plan: - Cabenuva injections administered - Next injections scheduled for 3/10 with Dr. HaJohnnye Simand 5/5 with me - Check HIV RNA - Call with any issues or questions  AmAlfonse SprucePharmD, CPP Clinical Pharmacist Practitioner [facility]

## 2021-10-08 ENCOUNTER — Encounter: Payer: Medicare HMO | Admitting: Pharmacist

## 2021-10-09 LAB — HIV-1 RNA QUANT-NO REFLEX-BLD
HIV 1 RNA Quant: NOT DETECTED Copies/mL
HIV-1 RNA Quant, Log: NOT DETECTED Log cps/mL

## 2021-10-12 ENCOUNTER — Other Ambulatory Visit: Payer: Self-pay | Admitting: Internal Medicine

## 2021-10-12 DIAGNOSIS — N5201 Erectile dysfunction due to arterial insufficiency: Secondary | ICD-10-CM

## 2021-10-12 DIAGNOSIS — Z79891 Long term (current) use of opiate analgesic: Secondary | ICD-10-CM

## 2021-10-13 ENCOUNTER — Other Ambulatory Visit: Payer: Self-pay | Admitting: Internal Medicine

## 2021-10-14 MED ORDER — HYDROCODONE-ACETAMINOPHEN 7.5-325 MG PO TABS: 1.0000 | ORAL_TABLET | Freq: Four times a day (QID) | ORAL | 0 refills | Status: DC | PRN

## 2021-10-14 MED ORDER — TADALAFIL 5 MG PO TABS: 5.0000 mg | ORAL_TABLET | Freq: Every day | ORAL | 0 refills | Status: DC | PRN

## 2021-10-20 ENCOUNTER — Other Ambulatory Visit (HOSPITAL_COMMUNITY): Payer: Self-pay

## 2021-10-20 ENCOUNTER — Encounter: Payer: Self-pay | Admitting: Internal Medicine

## 2021-10-21 ENCOUNTER — Other Ambulatory Visit (HOSPITAL_COMMUNITY): Payer: Self-pay

## 2021-10-22 ENCOUNTER — Telehealth: Payer: Self-pay

## 2021-10-22 NOTE — Telephone Encounter (Signed)
Pa for pt ( OZEMPIC 2MG /DOSE 8 MG/3ML ) came through from pharmacy stating medication not covered please send alternative .Marland Kitchen PA was ran on cover my meds  and  decision came right back as follows :     Message from Plan   Available without authorization.  ( COPY SENT TO PHARMACY ALSO )

## 2021-10-23 DIAGNOSIS — H2513 Age-related nuclear cataract, bilateral: Secondary | ICD-10-CM | POA: Diagnosis not present

## 2021-10-23 DIAGNOSIS — H02056 Trichiasis without entropian left eye, unspecified eyelid: Secondary | ICD-10-CM | POA: Diagnosis not present

## 2021-10-23 DIAGNOSIS — E119 Type 2 diabetes mellitus without complications: Secondary | ICD-10-CM | POA: Diagnosis not present

## 2021-10-23 DIAGNOSIS — H5213 Myopia, bilateral: Secondary | ICD-10-CM | POA: Diagnosis not present

## 2021-10-23 DIAGNOSIS — H02054 Trichiasis without entropian left upper eyelid: Secondary | ICD-10-CM | POA: Diagnosis not present

## 2021-10-23 DIAGNOSIS — H52223 Regular astigmatism, bilateral: Secondary | ICD-10-CM | POA: Diagnosis not present

## 2021-10-23 DIAGNOSIS — H04123 Dry eye syndrome of bilateral lacrimal glands: Secondary | ICD-10-CM | POA: Diagnosis not present

## 2021-10-23 DIAGNOSIS — H33322 Round hole, left eye: Secondary | ICD-10-CM | POA: Diagnosis not present

## 2021-10-23 DIAGNOSIS — H524 Presbyopia: Secondary | ICD-10-CM | POA: Diagnosis not present

## 2021-10-23 LAB — HM DIABETES EYE EXAM

## 2021-10-27 DIAGNOSIS — H31092 Other chorioretinal scars, left eye: Secondary | ICD-10-CM | POA: Diagnosis not present

## 2021-10-27 DIAGNOSIS — H25813 Combined forms of age-related cataract, bilateral: Secondary | ICD-10-CM | POA: Diagnosis not present

## 2021-10-27 DIAGNOSIS — H35363 Drusen (degenerative) of macula, bilateral: Secondary | ICD-10-CM | POA: Diagnosis not present

## 2021-10-27 DIAGNOSIS — E113211 Type 2 diabetes mellitus with mild nonproliferative diabetic retinopathy with macular edema, right eye: Secondary | ICD-10-CM | POA: Diagnosis not present

## 2021-10-27 DIAGNOSIS — H33322 Round hole, left eye: Secondary | ICD-10-CM | POA: Diagnosis not present

## 2021-10-27 LAB — HM DIABETES EYE EXAM

## 2021-10-28 LAB — HM DIABETES EYE EXAM

## 2021-11-06 ENCOUNTER — Other Ambulatory Visit: Payer: Self-pay | Admitting: Internal Medicine

## 2021-11-06 DIAGNOSIS — I10 Essential (primary) hypertension: Secondary | ICD-10-CM

## 2021-11-06 DIAGNOSIS — H524 Presbyopia: Secondary | ICD-10-CM | POA: Diagnosis not present

## 2021-11-06 DIAGNOSIS — Z0100 Encounter for examination of eyes and vision without abnormal findings: Secondary | ICD-10-CM | POA: Diagnosis not present

## 2021-11-06 DIAGNOSIS — H33322 Round hole, left eye: Secondary | ICD-10-CM | POA: Diagnosis not present

## 2021-11-06 MED ORDER — LOSARTAN POTASSIUM-HCTZ 50-12.5 MG PO TABS: 1.0000 | ORAL_TABLET | Freq: Every day | ORAL | 1 refills | Status: DC

## 2021-11-06 MED ORDER — CARVEDILOL 3.125 MG PO TABS: 3.1250 mg | ORAL_TABLET | Freq: Two times a day (BID) | ORAL | 1 refills | Status: DC

## 2021-11-18 ENCOUNTER — Other Ambulatory Visit: Payer: Self-pay | Admitting: Internal Medicine

## 2021-11-18 DIAGNOSIS — N5201 Erectile dysfunction due to arterial insufficiency: Secondary | ICD-10-CM

## 2021-11-18 DIAGNOSIS — Z79891 Long term (current) use of opiate analgesic: Secondary | ICD-10-CM

## 2021-11-19 MED ORDER — TADALAFIL 5 MG PO TABS: 5.0000 mg | ORAL_TABLET | Freq: Every day | ORAL | 0 refills | Status: DC | PRN

## 2021-11-19 MED ORDER — HYDROCODONE-ACETAMINOPHEN 7.5-325 MG PO TABS: 1.0000 | ORAL_TABLET | Freq: Four times a day (QID) | ORAL | 0 refills | Status: DC | PRN

## 2021-11-19 NOTE — Telephone Encounter (Signed)
Last UA ToxAssure 07/01/2021.

## 2021-11-25 ENCOUNTER — Encounter: Payer: Self-pay | Admitting: Internal Medicine

## 2021-11-25 ENCOUNTER — Other Ambulatory Visit (HOSPITAL_COMMUNITY): Payer: Self-pay

## 2021-11-25 ENCOUNTER — Ambulatory Visit (INDEPENDENT_AMBULATORY_CARE_PROVIDER_SITE_OTHER): Payer: Medicare HMO | Admitting: Internal Medicine

## 2021-11-25 DIAGNOSIS — J309 Allergic rhinitis, unspecified: Secondary | ICD-10-CM

## 2021-11-25 DIAGNOSIS — L270 Generalized skin eruption due to drugs and medicaments taken internally: Secondary | ICD-10-CM | POA: Diagnosis not present

## 2021-11-25 DIAGNOSIS — E11618 Type 2 diabetes mellitus with other diabetic arthropathy: Secondary | ICD-10-CM | POA: Diagnosis not present

## 2021-11-25 MED ORDER — HYDROXYZINE HCL 10 MG PO TABS: 10.0000 mg | ORAL_TABLET | Freq: Three times a day (TID) | ORAL | 0 refills | Status: DC | PRN

## 2021-11-25 MED ORDER — VARENICLINE TARTRATE 0.03 MG/ACT NA SOLN: 1.0000 | Freq: Every day | NASAL | 0 refills | Status: DC | PRN

## 2021-11-25 NOTE — Progress Notes (Signed)
° °  CC: diffuse rash  HPI:  [name] is a 66 y.o. male with PMHx as stated below presenting for evaluation of diffuse rash across chest since starting ozempic. He is also endorsing left ear pain for several weeks in setting of sinus congestion. Please see problem based charting for complete assessment and plan.  Past Medical History:  Diagnosis Date   Anxiety    Arthritis    "pretty much all over"   Asthma    "mild" (07/17/2014)   Atypical angina ([place])    Myoview 2003, Normal EF 64%   CAD (coronary artery disease)    a. 10/20 DES to 80% mid LAD   Candida rash of groin 11/03/2019   COPD (chronic obstructive pulmonary disease) ([place])    "mild" (07/17/2014)   Depression    Fatigue 06/22/2019   Foot pain 03/30/2011   GERD (gastroesophageal reflux disease)    H/O hiatal hernia    Headache    "weekly" (06/2014)   History of gout    HIV (human immunodeficiency virus infection) ([place]) 1995   DX after shingles followed by Dr. Ola Spurr (ID)   Hx of cardiovascular stress test    ETT-Myoview (3/16):  Ex 10:15, No Ischemia, EF 57%; Normal Study   Hyperlipidemia    Hypertension    Well Controlled off meds   Increased anion gap metabolic acidosis 2/42/6834   Kidney stones    "passed them all"   Left lower quadrant abdominal pain 11/02/2019   Other and unspecified angina pectoris 12/18/2008   Overview:  Overview:  Greatly appreciate PCP f/u.   Last Assessment & Plan:  - Patient with episode of CP - 4 days, constant, substernal, not immediately resolved with NTG, felt better after burping and passing gas - Repeat EKG done here with no acute ST/T wave changes - CP now completely resolved. Encouraged patient to call 911 if recurs - To f/u with cardio in the next 2 weeks for further assess   Pneumonia 4-5 times   Rheumatoid arthritis(714.0)    Followed by Dr. Charlestine Night, off MTX and prdnisone since 2007   Right hip pain 04/25/2019   Right shoulder pain 01/19/2018   Shingles (herpes zoster)  polyneuropathy 11/08/2018   Sleep apnea    does not wear mask (07/17/2014)   Type II diabetes mellitus ([place])    Review of Systems:  Negative except as stated in HPI.   Physical Exam:  Vitals:   11/25/21 1355  BP: (!) 151/90  Pulse: 76  Temp: 98.7 F (37.1 C)  TempSrc: Oral  SpO2: 99%  Weight: 191 lb 6.4 oz (86.8 kg)  Height: 5\' 10"  (1.778 m)   Physical Exam  Constitutional: Appears well-developed and well-nourished. No distress.  HEENT: Normocephalic, atraumatic; bilat ear canal without erythema or edema, TM nl Cardiovascular: Normal rate, regular rhythm, S1 and S2 present, no murmurs, rubs, gallops.  Distal pulses intact Musculoskeletal: Normal bulk and tone.  No peripheral edema noted. Skin: Warm and dry.  Diffuse erythema across chest extending to sternum with few crusted lesions; no vesicles, macules or papules noted.  Psychiatric: Normal mood and affect.   Assessment & Plan:   See Encounters Tab for problem based charting.  Patient discussed with Dr.  Cain Sieve

## 2021-11-25 NOTE — Patient Instructions (Addendum)
Mr Jaman Aro,  It was a pleasure seeing you in clinic. Today we discussed:   Rash: At this time, please hold your ozempic. You may treat rash with topical hydrocortisone and hydroxyzine as needed for itching. Follow up in 2 weeks.   Diabetes:  At this time, please increase your basaglar to 23U daily. STOP Ozempic. Continue to monitor your blood sugars. If you are having frequent hypoglycemic episodes, please contact us.  Follow up in 2 weeks for glucose monitoring and insulin adjustment  If you have any questions or concerns, please call our clinic at [phone] between 9am-5pm and after hours call [phone] and ask for the internal medicine resident on call. If you feel you are having a medical emergency please call 911.   Thank you, we look forward to helping you remain healthy!

## 2021-11-26 ENCOUNTER — Telehealth: Payer: Self-pay

## 2021-11-26 NOTE — Telephone Encounter (Signed)
DECISION :  ? ? ? ?Your information has been submitted to [facility]. [employer] will review the request and will issue a decision, typically within 3-7 days from your submission.  ?

## 2021-11-26 NOTE — Telephone Encounter (Signed)
PA for pt ( TYRVAYA0.03 MG /ACT SOL) came through on cover my meds was submitted with office notes ... awaiting approval or denial  ?

## 2021-11-27 ENCOUNTER — Other Ambulatory Visit (HOSPITAL_COMMUNITY): Payer: Self-pay

## 2021-11-27 ENCOUNTER — Telehealth: Payer: Self-pay

## 2021-11-27 NOTE — Telephone Encounter (Signed)
[facility] Patient Advocate Encounter ?  ?I was successful in securing patient a $ 7500.00 grant from [employer] to provide copayment coverage for CABENUVA.  The patient's out of pocket cost will be $5.00 monthly.   ?  ?I have spoken with the patient.   ? ?The billing information is as follows and has been shared with [facility].  ? ? ? ? ?Dates of Eligibility: 11/27/21 through 09/27/22 ? ?Patient knows to call the office with questions or concerns. ? ?Ileene Patrick, CPhT ?Specialty Pharmacy Patient Advocate ?[facility] ?Phone: [phone] ?Fax:  [phone]  ?

## 2021-11-27 NOTE — Assessment & Plan Note (Signed)
Patient reports progressively worsening rash and pruritis since starting ozempic. He notes that it started on central chest and has spread across his upper chest now and has also noted similar rash on buttocks. No recent changes in soaps or detergents and no other medication changes noted. On examination, diffuse erythema noted across upper chest without maculopapular rash but did have a few crusted over lesions from where he previously scratched. Allergic rash is not common side effect of ozempic; however, will trial off of ozempic at this time and monitor rash ? ?Plan: ?Discontinue ozempic at this time ?Hydroxyzine 10mg  tid and Topical hydrocortisone for itching prn   ?

## 2021-11-27 NOTE — Assessment & Plan Note (Signed)
Patient has previously required zpack for his allergic rhinitis. Has tried flonase and zyrtec in the past without significant relief. He was prescribed azelastine-fluticasone at last visit; however reports no significant improvement in symptoms with this. He does report ongoing sinus congestion and ear pain. No signs of infection noted on exam at this time. Patient reports that he was previously tried on varenicline nasal spray with improvement and would like a refill on this ? ?Plan: ?Varenicline nasal spray .03; 1 spray prn ?

## 2021-11-27 NOTE — Assessment & Plan Note (Signed)
Most recent A1c of 9.3, up from 7.4 suspected to be secondary to dietary indiscretion. Patient has been on Synjardy, [employer] 20U daily and has started taking ozempic. However, reports associated rash with the ozempic for which he is being evaluated today. Patient advised to hold ozempic at this time. CGM monitoring noted to have average blood glucose readings in 150-170s. However, did have a few readings as low as 70 for which time patient reports associated symptoms of hypoglycemia.  ?As he is advised to hold ozempic, will have him increase basaglar dosing and continue CGM monitoring ? ?Plan: ?Increase Basaglar to 23U daily  ?Discontinue ozempic  ?Continue Synjardy daily  ?Follow up in 2 weeks for CGM monitoring and insulin adjustment ?

## 2021-11-28 ENCOUNTER — Telehealth: Payer: Self-pay

## 2021-11-28 NOTE — Telephone Encounter (Signed)
DECISION : ? ? ? Denied  ? ? Next step was a appeal through cover my meds was submitted 11/28/21 .. awaiting if approved or denied  ?

## 2021-11-28 NOTE — Telephone Encounter (Signed)
[facility] Patient Advocate Encounter ? ?Patient's medication(Cabenuva) have been couriered to [facility] from [facility] and will be administered on the patient next office visit on 12/05/21. ? ?Ileene Patrick , CPhT ?Specialty Pharmacy Patient Advocate ?[facility] ?Phone: [phone] ?Fax:  [phone]  ?

## 2021-12-01 NOTE — Progress Notes (Signed)
Internal Medicine Clinic Attending  Case discussed with Dr. Marva Panda  At the time of the visit.  We reviewed the residents history and exam and pertinent patient test results.  I agree with the assessment, diagnosis, and plan of care documented in the residents note.   Rash is an uncommon side effect of Ozempic, but I don't have any other good explanation for his rash, and the timing fits well with starting Ozempic. Will stop Ozempic today and see if rash resolves. If so, will add Ozempic to his allergy list.

## 2021-12-05 ENCOUNTER — Other Ambulatory Visit: Payer: Self-pay

## 2021-12-05 ENCOUNTER — Ambulatory Visit (INDEPENDENT_AMBULATORY_CARE_PROVIDER_SITE_OTHER): Payer: Medicare HMO | Admitting: Student

## 2021-12-05 ENCOUNTER — Encounter: Payer: Self-pay | Admitting: Student

## 2021-12-05 ENCOUNTER — Ambulatory Visit (INDEPENDENT_AMBULATORY_CARE_PROVIDER_SITE_OTHER): Payer: Medicare HMO | Admitting: Infectious Diseases

## 2021-12-05 VITALS — BP 131/76 | HR 69 | Temp 97.7°F | Resp 20 | Ht 70.0 in | Wt 196.0 lb

## 2021-12-05 VITALS — BP 166/94 | HR 71 | Resp 16 | Ht 70.0 in | Wt 195.0 lb

## 2021-12-05 DIAGNOSIS — R197 Diarrhea, unspecified: Secondary | ICD-10-CM

## 2021-12-05 DIAGNOSIS — B20 Human immunodeficiency virus [HIV] disease: Secondary | ICD-10-CM

## 2021-12-05 DIAGNOSIS — Z720 Tobacco use: Secondary | ICD-10-CM

## 2021-12-05 DIAGNOSIS — L270 Generalized skin eruption due to drugs and medicaments taken internally: Secondary | ICD-10-CM

## 2021-12-05 DIAGNOSIS — I10 Essential (primary) hypertension: Secondary | ICD-10-CM

## 2021-12-05 DIAGNOSIS — Z794 Long term (current) use of insulin: Secondary | ICD-10-CM | POA: Diagnosis not present

## 2021-12-05 DIAGNOSIS — I2510 Atherosclerotic heart disease of native coronary artery without angina pectoris: Secondary | ICD-10-CM | POA: Diagnosis not present

## 2021-12-05 DIAGNOSIS — Z113 Encounter for screening for infections with a predominantly sexual mode of transmission: Secondary | ICD-10-CM | POA: Diagnosis not present

## 2021-12-05 DIAGNOSIS — E1169 Type 2 diabetes mellitus with other specified complication: Secondary | ICD-10-CM | POA: Diagnosis not present

## 2021-12-05 DIAGNOSIS — Z0000 Encounter for general adult medical examination without abnormal findings: Secondary | ICD-10-CM

## 2021-12-05 DIAGNOSIS — N5201 Erectile dysfunction due to arterial insufficiency: Secondary | ICD-10-CM | POA: Diagnosis not present

## 2021-12-05 DIAGNOSIS — E11618 Type 2 diabetes mellitus with other diabetic arthropathy: Secondary | ICD-10-CM

## 2021-12-05 DIAGNOSIS — Z79899 Other long term (current) drug therapy: Secondary | ICD-10-CM | POA: Diagnosis not present

## 2021-12-05 DIAGNOSIS — J309 Allergic rhinitis, unspecified: Secondary | ICD-10-CM

## 2021-12-05 MED ORDER — CABOTEGRAVIR & RILPIVIRINE ER 600 & 900 MG/3ML IM SUER
1.0000 | Freq: Once | INTRAMUSCULAR | Status: AC
  Administered 2021-12-05: 1 via INTRAMUSCULAR

## 2021-12-05 MED ORDER — BASAGLAR KWIKPEN 100 UNIT/ML ~~LOC~~ SOPN: 26.0000 [IU] | PEN_INJECTOR | Freq: Every day | SUBCUTANEOUS | 3 refills | Status: DC

## 2021-12-05 MED ORDER — VARENICLINE TARTRATE 0.03 MG/ACT NA SOLN: 1.0000 | Freq: Every day | NASAL | 0 refills | Status: DC | PRN

## 2021-12-05 MED ORDER — SYNJARDY XR 10-1000 MG PO TB24: 2.0000 | ORAL_TABLET | Freq: Every day | ORAL | 1 refills | Status: DC

## 2021-12-05 NOTE — Assessment & Plan Note (Signed)
He has f/u with [facility] today.  ?Appreciate their great care of him ?He is asx.  ?

## 2021-12-05 NOTE — Assessment & Plan Note (Signed)
Patient with significant improvement of erythema and pruritis on chest and arms since discontinuing ozempic. Denied ever having feelings of shortness of breath, lip or throat swelling. Ozempic added to allergy list.  ?

## 2021-12-05 NOTE — Assessment & Plan Note (Signed)
Prev myoveiw low risk. ?Will continue to watch.  ?CV f/u ?

## 2021-12-05 NOTE — Assessment & Plan Note (Addendum)
BP well controlled on current regimen of carvedilol 3.125 and hyzaar 50-12.5 daily. Does endorse occasional dizziness when he stands too quickly. Discussed safe practice of waiting a few seconds after standing and then begin walking. If it becomes more frequent, encouraged patient call the clinic.  ?

## 2021-12-05 NOTE — Progress Notes (Signed)
? ?CC: Follow up chronic medical conditions of diabetes and hypertension ? ?HPI: ? ?[name] is a 66 y.o. male with a past medical history stated below and presents today for follow up to discuss his diabetes management, recent allergic reaction, hypertension. Please see problem based assessment and plan for additional details. ? ?Past Medical History:  ?Diagnosis Date  ? Anxiety   ? Arthritis   ? "pretty much all over"  ? Asthma   ? "mild" (07/17/2014)  ? Atypical angina ([place])   ? Myoview 2003, Normal EF 64%  ? CAD (coronary artery disease)   ? a. 10/20 DES to 80% mid LAD  ? Candida rash of groin 11/03/2019  ? COPD (chronic obstructive pulmonary disease) ([place])   ? "mild" (07/17/2014)  ? Depression   ? Fatigue 06/22/2019  ? Foot pain 03/30/2011  ? GERD (gastroesophageal reflux disease)   ? H/O hiatal hernia   ? Headache   ? "weekly" (06/2014)  ? History of gout   ? HIV (human immunodeficiency virus infection) ([place]) 1995  ? DX after shingles followed by Dr. Ola Spurr ([place])  ? Hx of cardiovascular stress test   ? ETT-Myoview (3/16):  Ex 10:15, No Ischemia, EF 57%; Normal Study  ? Hyperlipidemia   ? Hypertension   ? Well Controlled off meds  ? Increased anion gap metabolic acidosis 01/19/5360  ? Kidney stones   ? "passed them all"  ? Left lower quadrant abdominal pain 11/02/2019  ? Other and unspecified angina pectoris 12/18/2008  ? Overview:  Overview:  Greatly appreciate PCP f/u.   Last Assessment & Plan:  - Patient with episode of CP - 4 days, constant, substernal, not immediately resolved with NTG, felt better after burping and passing gas - Repeat EKG done here with no acute ST/T wave changes - CP now completely resolved. Encouraged patient to call 911 if recurs - To f/u with cardio in the next 2 weeks for further assess  ? Pneumonia 4-5 times  ? Rheumatoid arthritis(714.0)   ? Followed by Dr. Charlestine Night, off MTX and prdnisone since 2007  ? Right hip pain 04/25/2019  ? Right shoulder pain 01/19/2018  ? Shingles  (herpes zoster) polyneuropathy 11/08/2018  ? Sleep apnea   ? does not wear mask (07/17/2014)  ? Type II diabetes mellitus ([place])   ? ? ?Current Outpatient Medications on File Prior to Visit  ?Medication Sig Dispense Refill  ? albuterol (VENTOLIN HFA) 108 (90 Base) MCG/ACT inhaler Inhale 2 puffs into the lungs every 6 (six) hours as needed for wheezing or shortness of breath. 9 g 3  ? aspirin 81 MG EC tablet Take 81 mg by mouth daily.      ? atorvastatin (LIPITOR) 40 MG tablet Take 1 tablet (40 mg total) by mouth daily. 90 tablet 0  ? BD PEN NEEDLE NANO 2ND GEN 32G X 4 MM MISC USE UP TO 8 PER WEEK 100 each 1  ? betamethasone dipropionate 0.05 % cream Apply topically 2 (two) times daily. 30 g 0  ? cabotegravir & rilpivirine ER (CABENUVA) 600 & 900 MG/3ML injection Inject 1 kit into the muscle every 30 (thirty) days. 6 mL 1  ? cabotegravir & rilpivirine ER (CABENUVA) 600 & 900 MG/3ML injection Inject 1 kit into the muscle every 2 (two) months. 6 mL 5  ? carvedilol (COREG) 3.125 MG tablet Take 1 tablet (3.125 mg total) by mouth 2 (two) times daily with a meal. 180 tablet 1  ? clopidogrel (PLAVIX) 75 MG  tablet Take 1 tablet by mouth once daily 90 tablet 2  ? Empagliflozin-metFORMIN HCl ER (SYNJARDY XR) 06-999 MG TB24 Take 1 tablet by mouth daily. 90 tablet 1  ? famotidine (PEPCID) 20 MG tablet Take 1 tablet (20 mg total) by mouth 2 (two) times daily for 14 days. 28 tablet 0  ? fluticasone (FLONASE) 50 MCG/ACT nasal spray Place 1 spray into both nostrils daily. 16 g 0  ? HYDROcodone-acetaminophen (NORCO) 7.5-325 MG tablet Take 1 tablet by mouth every 6 (six) hours as needed for moderate pain. 60 tablet 0  ? hydrOXYzine (ATARAX) 10 MG tablet Take 1 tablet (10 mg total) by mouth 3 (three) times daily as needed for itching. 30 tablet 0  ? Insulin Glargine (BASAGLAR KWIKPEN) 100 UNIT/ML Inject 20 Units into the skin daily. (Patient taking differently: Inject 23 Units into the skin daily.) 15 mL 3  ? loratadine (CLARITIN) 10 MG  tablet Take 1 tablet (10 mg total) by mouth daily. 90 tablet 3  ? losartan-hydrochlorothiazide (HYZAAR) 50-12.5 MG tablet Take 1 tablet by mouth daily. 90 tablet 1  ? Multiple Vitamins-Minerals (CENTRUM ADULTS PO) Take 1 tablet by mouth daily.    ? nitroGLYCERIN (NITROSTAT) 0.4 MG SL tablet PLACE 1 TABLET UNDER THE TONGUE EVERY 5 MINUTES AS NEEDED FOR CHEST PAIN 25 tablet 3  ? nystatin (NYSTATIN) powder Apply 1 application topically 2 (two) times daily. 45 g 1  ? omeprazole (PRILOSEC) 20 MG capsule TAKE 1 CAPSULE BY MOUTH AS NEEDED 90 capsule 3  ? tadalafil (CIALIS) 5 MG tablet Take 1 tablet (5 mg total) by mouth daily as needed for erectile dysfunction. 10 tablet 0  ? terazosin (HYTRIN) 2 MG capsule Take 1 capsule by mouth at bedtime 90 capsule 3  ? Varenicline Tartrate 0.03 MG/ACT SOLN Place 1 spray into the nose daily as needed. 4.2 mL 0  ? ?No current facility-administered medications on file prior to visit.  ? ? ?Family History  ?Problem Relation Age of Onset  ? Osteoporosis Mother   ? Diabetes Mother   ? Heart disease Father   ? Heart attack Father   ? Diabetes Father   ? COPD Father   ? Heart attack Brother   ? Stroke Maternal Grandmother   ? Stroke Maternal Grandfather   ? Stroke Paternal Grandmother   ? Stroke Paternal Grandfather   ? ? ?Social History  ? ?Socioeconomic History  ? Marital status: Single  ?  Spouse name: Not on file  ? Number of children: Not on file  ? Years of education: 2  ? Highest education level: Not on file  ?Occupational History  ? Occupation: [employer]  ? Occupation: Administrator (retired)  ?Tobacco Use  ? Smoking status: Every Day  ?  Packs/day: 1.00  ?  Years: 46.00  ?  Pack years: 46.00  ?  Types: Cigarettes  ? Smokeless tobacco: Never  ?Vaping Use  ? Vaping Use: Never used  ?Substance and Sexual Activity  ? Alcohol use: Not Currently  ?  Alcohol/week: 1.0 standard drink  ?  Types: 1 Glasses of wine per week  ?  Comment: 07/17/2014 "glass of wine once or twice/month"  ? Drug use: No   ? Sexual activity: Yes  ?  Partners: Male  ?  Comment: declined condoms 08/2019  ?Other Topics Concern  ? Not on file  ?Social History Narrative  ? [place] approved beginning 12/11/2009 - 12/27/2010  ? [employer] benefits approved; patient eligible for 100% discount for  out patient labs and office visits. Patient eligible for 70% discount for other services per Irish Elders 09/08/2010  ?   ? Patient is on disability, and walks 2-3 times per week.   ? ?Social Determinants of Health  ? ?Financial Resource Strain: Not on file  ?Food Insecurity: Not on file  ?Transportation Needs: Not on file  ?Physical Activity: Not on file  ?Stress: Not on file  ?Social Connections: Not on file  ?Intimate Partner Violence: Not on file  ? ? ?Review of Systems: ?ROS negative except for what is noted on the assessment and plan. ? ?Vitals:  ? 12/05/21 1048 12/05/21 1142  ?BP: (!) 148/73 131/76  ?Pulse: 68 69  ?Resp: 20   ?Temp: 97.7 ?F (36.5 ?C)   ?TempSrc: Oral   ?SpO2: 100%   ?Weight: 196 lb (88.9 kg)   ?Height: $RemoveB'5\' 10"'GBjEWStb$  (1.778 m)   ? ? ?Physical Exam: ?Constitutional: well appearing, no acute distress ?HENT: normocephalic atraumatic ?Eyes: conjunctiva non-erythematous ?Neck: supple ?Cardiovascular: regular rate and rhythm, systolic murmur 2/6 best heard over right second intercostal space ?Pulmonary/Chest: normal work of breathing on room air, lungs clear to auscultation bilaterally ?MSK: normal bulk and tone ?Neurological: alert & oriented x  ?Skin: warm and dry. Erythema to anterior chest.  ?Psych: normal mood and thought process ? ?Assessment & Plan:  ? ?See Encounters Tab for problem based charting. ? ?Patient discussed with Dr. Cain Sieve ? ?Sanjuana Letters, D.O. ?[facility], PGY-2 ?Pager: [phone], Phone: [phone] ?Date 12/05/2021 Time 3:55 PM  ?

## 2021-12-05 NOTE — Assessment & Plan Note (Addendum)
Assessment: ?A1c of 9.3% two months ago. Current regimen of synjardy xr 06-999 2 tab daily (current med list reads once daily, will adjust to correct dosing) and glargine 23 U daily. Denies episodes of hypoglycemia. CBG report shows morning glucose readings ranging from 130-180s. Will increase glargine to 26 U daily. Patient instructed to decrease glargine back to 23 U daily if he has episodes of hypoglycemia with increased dose.  ? ?With allergic reaction to victoza and ozempic, would not prescribe trulicity. Continue current regimen and hopefully he has significant improvement in his A1c next month.  ? ?Plan: ?-A1c repeat in one month ?-increase glargine to 26 U daily, revert back to 23 U if low glucose readings ?-continue synjary XR 06-999 BID ?

## 2021-12-05 NOTE — Assessment & Plan Note (Signed)
He is doing well ?cabaneuva injection today ?Offered/refused condoms.  ?vax are up to date by his hx (flu COVID). Has completed zoster, tdap, pcv.  ?Will see him in 9 months ?

## 2021-12-05 NOTE — Assessment & Plan Note (Signed)
Encouraged to quit. 

## 2021-12-05 NOTE — Assessment & Plan Note (Signed)
Resolved.  ?Now constipation. Encouraged to drink more water.  ?

## 2021-12-05 NOTE — Assessment & Plan Note (Signed)
Continues to endorse ED symptoms and decreased libido. Patient has discussed with PCP in the past who did not believe with patient's history of CAD that testosterone supplementation was appropriate. Will let Dr. Dareen Piano patient's concern as he has a new partner he is traveling to see and would like to participate in sexual activities.  ?

## 2021-12-05 NOTE — Patient Instructions (Signed)
Thank you, Mr.Dasani J Lauritsen for allowing [place] to provide your care today. Today we discussed. ? ?Diabetes ?Please increase your insulin from 23 to 26 units. If you find yourself having low blood sugar readings (under 70) Please stop the 26 units and go back to 23 units. Please return in 1 month to have an A1c rechecked.  ? ?High Blood Pressure ?Your blood pressure looks great today! Please continue to take your medicines. If you find yourself feeling lightheaded or dizzy when you stand up too quickly, please call our clinic and schedule to be seen sooner.  ? ?Dry Eyes   ?I will work on getting you the eye drop medication you requested!  ? ?Decreased sexual drive ?I will reach out to Dr. Dareen Piano regarding this.  ? ?I have ordered the following labs for you: ? ?Lab Orders  ?No laboratory test(s) ordered today  ?  ? ?Referrals ordered today:  ? ?Referral Orders  ?No referral(s) requested today  ?  ? ?I have ordered the following medication/changed the following medications:  ? ?Stop the following medications: ?There are no discontinued medications.  ? ?Start the following medications: ?No orders of the defined types were placed in this encounter. ?  ? ?Follow up:  1 month   ? ?Should you have any questions or concerns please call the internal medicine clinic at [phone].   ? ?Sanjuana Letters, D.O. ?[place] ?  ?

## 2021-12-05 NOTE — Assessment & Plan Note (Signed)
Assessment: ?Patient endorses significant improvement with varenicline nasal spray (Tyrvaya). This was reordered during his past visit however it was associated with GERD diagnosis code and not covered. Will change to chronic allergic rhinitis.  ? ?Patient has history of varenicline gum (chantix), but has not had allergic reaction to the nasal spray.  ? ?Plan: ?-reorder varenicline nasal spray ?

## 2021-12-05 NOTE — Assessment & Plan Note (Signed)
Patient has had eye exam this year and would like notes faxed over to the office. Will reach out to front desk staff concerning this matter. He follows with [facility] and retinal specialist Dr. Posey Pronto  ?

## 2021-12-05 NOTE — Progress Notes (Signed)
? ?  Subjective:  ? ? Patient ID: Riley Jones, male  DOB: 30-Jan-1956, 66 y.o.        MRN: [id number] ? ? ?HPI ?66 yo M with hx of HIV+ (~76yr), DM2 (>181yr on insulin) and RA (took MTX without relief, stopped plaquenil- was hurting his eyes), COPD. [facility] 07-2017 he was changed to biktarvy, then to cabaneuva 2022.  ?Also hx of smoking. 1ppd. > 20years. Knows that he needs to "get serious" about quitting.  ?  ?CAD s/p DES to mid LAD in 06/2014, his EF 55-65% at that time. He had a myoview to f/u: (03-2021) no ischemia, low risk.  ? ?Overall today he is doing well. His previous diarrhea has resolved and now moves his bowels every 2-3 days.  ?Has persistent arthritis.  ?"I've had all my shots".  ?His FSG and A1C went up over the holidays. His last A1C was 9.30 Sep 2021.  ? ?HIV 1 RNA Quant (Copies/mL)  ?Date Value  ?10/07/2021 Not Detected  ?08/08/2021 <20 (H)  ?05/16/2021 <20 (H)  ? ?CD4 T Cell Abs (/uL)  ?Date Value  ?06/11/2020 848  ?08/09/2019 615  ?10/25/2018 760  ? ? ? ?Health Maintenance  ?Topic Date Due  ?? OPHTHALMOLOGY EXAM  05/02/2021  ?? COVID-19 Vaccine (6 - Booster for Pfizer series) 08/30/2021  ?? HEMOGLOBIN A1C  12/30/2021  ?? FOOT EXAM  01/21/2022  ?? LIPID PANEL  05/16/2022  ?? COLONOSCOPY (Pts [id number] coverage will need to be confirmed)  11/17/2027  ?? TETANUS/TDAP  06/14/2030  ?? Pneumonia Vaccine 65+22ears old  Completed  ?? INFLUENZA VACCINE  Completed  ?? Hepatitis C Screening  Completed  ?? Zoster Vaccines- Shingrix  Completed  ?? HPV VACCINES  Aged Out  ?? COLON CANCER SCREENING ANNUAL FOBT  Discontinued  ? ? ? ? ?Review of Systems  ?Constitutional:  Negative for weight loss.  ?HENT:  Positive for congestion.   ?Respiratory:  Negative for shortness of breath.   ?Cardiovascular:  Negative for chest pain.  ?Gastrointestinal:  Positive for constipation. Negative for diarrhea.  ?Genitourinary:  Negative for dysuria.  ?Musculoskeletal:  Positive  for joint pain.  ? ?Please see HPI. All other systems reviewed and negative. ? ?   ?Objective:  ?Physical Exam ?Vitals reviewed.  ?Constitutional:   ?   Appearance: He is normal weight.  ?HENT:  ?   Mouth/Throat:  ?   Mouth: Mucous membranes are moist.  ?   Pharynx: No oropharyngeal exudate.  ?Cardiovascular:  ?   Rate and Rhythm: Normal rate and regular rhythm.  ?Pulmonary:  ?   Effort: Pulmonary effort is normal.  ?   Breath sounds: Normal breath sounds.  ?Abdominal:  ?   General: Bowel sounds are normal.  ?   Palpations: Abdomen is soft.  ?Musculoskeletal:     ?   General: Normal range of motion.  ?   Cervical back: Normal range of motion and neck supple.  ?   Right lower leg: No edema.  ?   Left lower leg: No edema.  ?Neurological:  ?   General: No focal deficit present.  ? ? ? ? ? ? ?   ?Assessment & Plan:  ? ?

## 2021-12-05 NOTE — Assessment & Plan Note (Addendum)
Appreciate IMTS f/u.  ?Hopefully A1C recovering.  ?Has not lost wt with ozempic ?Has seen ophtho ?

## 2021-12-08 ENCOUNTER — Telehealth: Payer: Self-pay | Admitting: *Deleted

## 2021-12-08 NOTE — Progress Notes (Signed)
Internal Medicine Clinic Attending  Case discussed with Dr. Katsadouros  At the time of the visit.  We reviewed the resident's history and exam and pertinent patient test results.  I agree with the assessment, diagnosis, and plan of care documented in the resident's note.  

## 2021-12-08 NOTE — Telephone Encounter (Signed)
Ozempic is now covered under patient's insurance plan.  Basaglar KwikPen  now requires a PA.  Message to PCP to see if patient can restart the Ozempic. ?

## 2021-12-10 ENCOUNTER — Other Ambulatory Visit: Payer: Self-pay | Admitting: Internal Medicine

## 2021-12-10 DIAGNOSIS — N5201 Erectile dysfunction due to arterial insufficiency: Secondary | ICD-10-CM

## 2021-12-10 MED ORDER — HYDROXYZINE HCL 10 MG PO TABS
10.0000 mg | ORAL_TABLET | Freq: Three times a day (TID) | ORAL | 0 refills | Status: DC | PRN
Start: 2021-12-10 — End: 2021-12-31

## 2021-12-10 MED ORDER — TADALAFIL 5 MG PO TABS: 5.0000 mg | ORAL_TABLET | Freq: Every day | ORAL | 0 refills | Status: DC | PRN

## 2021-12-12 DIAGNOSIS — E1165 Type 2 diabetes mellitus with hyperglycemia: Secondary | ICD-10-CM | POA: Diagnosis not present

## 2021-12-23 DIAGNOSIS — H02056 Trichiasis without entropian left eye, unspecified eyelid: Secondary | ICD-10-CM | POA: Diagnosis not present

## 2021-12-23 DIAGNOSIS — H2513 Age-related nuclear cataract, bilateral: Secondary | ICD-10-CM | POA: Diagnosis not present

## 2021-12-23 DIAGNOSIS — H04123 Dry eye syndrome of bilateral lacrimal glands: Secondary | ICD-10-CM | POA: Diagnosis not present

## 2021-12-23 DIAGNOSIS — H33322 Round hole, left eye: Secondary | ICD-10-CM | POA: Diagnosis not present

## 2021-12-23 DIAGNOSIS — H5213 Myopia, bilateral: Secondary | ICD-10-CM | POA: Diagnosis not present

## 2021-12-23 DIAGNOSIS — E119 Type 2 diabetes mellitus without complications: Secondary | ICD-10-CM | POA: Diagnosis not present

## 2021-12-23 DIAGNOSIS — H52223 Regular astigmatism, bilateral: Secondary | ICD-10-CM | POA: Diagnosis not present

## 2021-12-23 DIAGNOSIS — H524 Presbyopia: Secondary | ICD-10-CM | POA: Diagnosis not present

## 2021-12-24 ENCOUNTER — Telehealth: Payer: Self-pay

## 2021-12-24 NOTE — Telephone Encounter (Signed)
DECISION : ? ? ?DENIED  ? ? ? ? ? ? ? BUT PLAN GAVE ALT  ? ? ? ?Health Plan?s Preferred Products ? ?LANTUS [id number] ? ?LEVEMIR [id number] ? ?TRESIBA [id number] ? ?TRESIBA FLEXTOUCH U-100 ? [id number] ? ?TRESIBA FLEXTOUCH U-200 ? ? [phone] ? ?

## 2021-12-24 NOTE — Telephone Encounter (Signed)
PA FOR PT ( Riley Jones )  came through again on cover my meds .. was submitted with new office notes and labs .. awaiting approval or denial  ? ? ? ? ? ? UPDATE : ? ? ? ?Your information has been submitted to [facility]. [employer] will review the request and will issue a decision, typically within 3-7 days from your submission. ?

## 2021-12-25 ENCOUNTER — Other Ambulatory Visit: Payer: Self-pay | Admitting: Internal Medicine

## 2021-12-25 DIAGNOSIS — Z794 Long term (current) use of insulin: Secondary | ICD-10-CM

## 2021-12-25 MED ORDER — LANTUS SOLOSTAR 100 UNIT/ML ~~LOC~~ SOPN: 26.0000 [IU] | PEN_INJECTOR | Freq: Every day | SUBCUTANEOUS | 3 refills | Status: DC

## 2021-12-26 ENCOUNTER — Encounter: Payer: Self-pay | Admitting: Internal Medicine

## 2021-12-27 DIAGNOSIS — R0602 Shortness of breath: Secondary | ICD-10-CM | POA: Diagnosis not present

## 2021-12-27 DIAGNOSIS — I10 Essential (primary) hypertension: Secondary | ICD-10-CM | POA: Diagnosis not present

## 2021-12-27 DIAGNOSIS — J441 Chronic obstructive pulmonary disease with (acute) exacerbation: Secondary | ICD-10-CM | POA: Diagnosis not present

## 2021-12-29 ENCOUNTER — Encounter: Payer: Self-pay | Admitting: Dietician

## 2021-12-30 ENCOUNTER — Encounter: Payer: Self-pay | Admitting: Internal Medicine

## 2021-12-30 ENCOUNTER — Other Ambulatory Visit: Payer: Self-pay | Admitting: Internal Medicine

## 2021-12-30 ENCOUNTER — Ambulatory Visit (INDEPENDENT_AMBULATORY_CARE_PROVIDER_SITE_OTHER): Payer: Medicare HMO | Admitting: Internal Medicine

## 2021-12-30 VITALS — BP 142/65 | HR 62 | Temp 98.2°F | Wt 192.4 lb

## 2021-12-30 DIAGNOSIS — J069 Acute upper respiratory infection, unspecified: Secondary | ICD-10-CM | POA: Diagnosis not present

## 2021-12-30 DIAGNOSIS — E1169 Type 2 diabetes mellitus with other specified complication: Secondary | ICD-10-CM

## 2021-12-30 DIAGNOSIS — Z720 Tobacco use: Secondary | ICD-10-CM | POA: Diagnosis not present

## 2021-12-30 DIAGNOSIS — E11618 Type 2 diabetes mellitus with other diabetic arthropathy: Secondary | ICD-10-CM

## 2021-12-30 DIAGNOSIS — I10 Essential (primary) hypertension: Secondary | ICD-10-CM

## 2021-12-30 DIAGNOSIS — Z794 Long term (current) use of insulin: Secondary | ICD-10-CM | POA: Diagnosis not present

## 2021-12-30 LAB — GLUCOSE, CAPILLARY: Glucose-Capillary: 280 mg/dL — ABNORMAL HIGH (ref 70–99)

## 2021-12-30 LAB — POCT GLYCOSYLATED HEMOGLOBIN (HGB A1C): Hemoglobin A1C: 9.2 % — AB (ref 4.0–5.6)

## 2021-12-30 MED ORDER — BENZONATATE 100 MG PO CAPS: 200.0000 mg | ORAL_CAPSULE | Freq: Three times a day (TID) | ORAL | 0 refills | Status: DC | PRN

## 2021-12-30 MED ORDER — GUANFACINE HCL ER 2 MG PO TB24: 2.0000 mg | ORAL_TABLET | Freq: Every day | ORAL | 0 refills | Status: DC

## 2021-12-30 MED ORDER — GUANFACINE HCL ER 2 MG PO TB24
2.0000 mg | ORAL_TABLET | Freq: Every day | ORAL | 0 refills | Status: DC
Start: 2021-12-30 — End: 2022-01-20

## 2021-12-30 NOTE — Assessment & Plan Note (Signed)
A1c on meter check 9.2. Patient recently developed an allergy to ozempic which likely has contributed to his worsened A1c. His sugars are persistently elevated. Will increase his long acting today to 32 units (from 26) daily and have him follow up with the diabetes educator as well as his PCP in the next month.  ?

## 2021-12-30 NOTE — Assessment & Plan Note (Signed)
Patient has quit Saturday 4/1. He feels motivated to continue his abstinence because of the money he will save and the fact that his dogs don't like it when he smokes. Does not feel like he needs any assistance at this time. ?- encouragement and positive feedback provided ?- f/u on continued cessation at next visit ?

## 2021-12-30 NOTE — Assessment & Plan Note (Addendum)
BP elevated to 150s on initial check, repeat still in the140s. Patient currently taking prednisone for URI. Suspect this and the intermittent coughing the patient is suffering from has elevated pressures today. Will defer medication changes to next visit. ?- recheck BP at next visit ?- f/u BMP ? ?Addendum: ?BMP unremarkable ?

## 2021-12-30 NOTE — Patient Instructions (Signed)
Riley Jones ? ?It was a pleasure seeing you in the clinic today.  ? ?We talked about your cough, your diabetes, and quitting smoking. ? ?Cough- continue taking the Zpack and the prednisone. I expect you will continue to feel better. If you symptoms get worse or do not resolve please contact us in clinic to be seen sooner. I will also prescribe you a medicine to help thin the mucous and clear your lungs. ?Diabetes- increase your insulin to 32 units daily ?Smoking- you did an awesome job stopping smoking. Keep up the hard work! ? ?Please call our clinic at [phone] if you have any questions or concerns. The best time to call is Monday-Friday from 9am-4pm, but there is someone available 24/7 at the same number. If you need medication refills, please notify your pharmacy one week in advance and they will send [place] a request. ?  ?Thank you for letting us take part in your care. We look forward to seeing you next time! ? ?

## 2021-12-30 NOTE — Progress Notes (Signed)
? ?  CC: Cough ? ?HPI: ? ?Mr.WARIS RODGER is a 66 y.o. PMH noted below, who presents to the [facility] with complaints of cough. To see the management of his acute and chronic conditions, please refer to the A&P note under the encounters tab.  ? ?Past Medical History:  ?Diagnosis Date  ? Anxiety   ? Arthritis   ? "pretty much all over"  ? Asthma   ? "mild" (07/17/2014)  ? Atypical angina ([place])   ? Myoview 2003, Normal EF 64%  ? CAD (coronary artery disease)   ? a. 10/20 DES to 80% mid LAD  ? Candida rash of groin 11/03/2019  ? COPD (chronic obstructive pulmonary disease) ([place])   ? "mild" (07/17/2014)  ? Depression   ? Fatigue 06/22/2019  ? Foot pain 03/30/2011  ? GERD (gastroesophageal reflux disease)   ? H/O hiatal hernia   ? Headache   ? "weekly" (06/2014)  ? History of gout   ? HIV (human immunodeficiency virus infection) ([place]) 1995  ? DX after shingles followed by Dr. Ola Spurr ([place])  ? Hx of cardiovascular stress test   ? ETT-Myoview (3/16):  Ex 10:15, No Ischemia, EF 57%; Normal Study  ? Hyperlipidemia   ? Hypertension   ? Well Controlled off meds  ? Increased anion gap metabolic acidosis 0/86/5784  ? Kidney stones   ? "passed them all"  ? Left lower quadrant abdominal pain 11/02/2019  ? Other and unspecified angina pectoris 12/18/2008  ? Overview:  Overview:  Greatly appreciate PCP f/u.   Last Assessment & Plan:  - Patient with episode of CP - 4 days, constant, substernal, not immediately resolved with NTG, felt better after burping and passing gas - Repeat EKG done here with no acute ST/T wave changes - CP now completely resolved. Encouraged patient to call 911 if recurs - To f/u with cardio in the next 2 weeks for further assess  ? Pneumonia 4-5 times  ? Rheumatoid arthritis(714.0)   ? Followed by Dr. Charlestine Night, off MTX and prdnisone since 2007  ? Right hip pain 04/25/2019  ? Right shoulder pain 01/19/2018  ? 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 Sleep apnea   ? does not wear mask (07/17/2014)  ? Type II  diabetes mellitus ([place])   ? ?Review of Systems:  positive for cough, sputum production, increased appetite, lightheadedness, negative for fevers, chills ? ?Physical Exam: ?Gen: elderly man intermittently coughing in NAD ?HEENT: normocephalic atraumatic ?CV: RRR, no m/r/g   ?Resp: intermittent cough, mild rhonchi that clear with coughing, no wheezing ?GI: soft, nontender ?MSK: moves all extremities without difficulty ?Skin:warm and dry ?Neuro:alert answering questions appropriately, normal gait ?Psych: normal affect ? ? ?Assessment & Plan:  ? ?See Encounters Tab for problem based charting. ? ?Patient discussed with Dr. Jimmye Norman  ? ?

## 2021-12-30 NOTE — Assessment & Plan Note (Signed)
Patient developed a cough about 2-3 weeks ago. This got worse and [facility] They diagnosed him with bronchitis and gave him a zpack and prednisone which he is still taking. Today he feels about 50% better. He still has a cough and is producing significant mucous and has a runny nose. Sometimes feels lightheaded when he coughs but is still able to do his normal activities of daily living. In this time period he also quit smoking (Saturday) which may be increasing his cough. He has had no fevers and has not taken anything for the cough.  ? ?A/P ?Respiratory infection is improving appropriately. Will have patient continue pred/azithro and follow up if symptoms do not improve ?- tessalon perles ?- guanfacine for cough ?- return precautions discussed ?

## 2021-12-30 NOTE — Progress Notes (Signed)
Internal Medicine Clinic Attending ° °Case discussed with Dr. DeMaio  At the time of the visit.  We reviewed the resident’s history and exam and pertinent patient test results.  I agree with the assessment, diagnosis, and plan of care documented in the resident’s note. ° ° °

## 2021-12-31 LAB — BASIC METABOLIC PANEL
BUN/Creatinine Ratio: 27 — ABNORMAL HIGH (ref 10–24)
BUN: 26 mg/dL (ref 8–27)
CO2: 23 mmol/L (ref 20–29)
Calcium: 9.7 mg/dL (ref 8.6–10.2)
Chloride: 96 mmol/L (ref 96–106)
Creatinine, Ser: 0.98 mg/dL (ref 0.76–1.27)
Glucose: 299 mg/dL — ABNORMAL HIGH (ref 70–99)
Potassium: 4.5 mmol/L (ref 3.5–5.2)
Sodium: 136 mmol/L (ref 134–144)
eGFR: 85 mL/min/{1.73_m2} (ref >59)

## 2022-01-11 ENCOUNTER — Other Ambulatory Visit: Payer: Self-pay | Admitting: Internal Medicine

## 2022-01-11 DIAGNOSIS — N5201 Erectile dysfunction due to arterial insufficiency: Secondary | ICD-10-CM

## 2022-01-11 DIAGNOSIS — Z79891 Long term (current) use of opiate analgesic: Secondary | ICD-10-CM

## 2022-01-13 MED ORDER — HYDROCODONE-ACETAMINOPHEN 7.5-325 MG PO TABS: 1.0000 | ORAL_TABLET | Freq: Four times a day (QID) | ORAL | 0 refills | Status: DC | PRN

## 2022-01-13 MED ORDER — TADALAFIL 5 MG PO TABS: 5.0000 mg | ORAL_TABLET | Freq: Every day | ORAL | 0 refills | Status: DC | PRN

## 2022-01-20 ENCOUNTER — Other Ambulatory Visit (HOSPITAL_COMMUNITY): Payer: Self-pay

## 2022-01-20 ENCOUNTER — Ambulatory Visit (INDEPENDENT_AMBULATORY_CARE_PROVIDER_SITE_OTHER): Payer: Medicare HMO | Admitting: Dietician

## 2022-01-20 ENCOUNTER — Ambulatory Visit (INDEPENDENT_AMBULATORY_CARE_PROVIDER_SITE_OTHER): Payer: Medicare HMO | Admitting: Internal Medicine

## 2022-01-20 ENCOUNTER — Encounter: Payer: Self-pay | Admitting: Dietician

## 2022-01-20 ENCOUNTER — Other Ambulatory Visit: Payer: Self-pay | Admitting: Internal Medicine

## 2022-01-20 VITALS — BP 125/62 | HR 63 | Temp 98.4°F | Ht 67.0 in | Wt 194.1 lb

## 2022-01-20 DIAGNOSIS — Z0000 Encounter for general adult medical examination without abnormal findings: Secondary | ICD-10-CM

## 2022-01-20 DIAGNOSIS — F32A Depression, unspecified: Secondary | ICD-10-CM | POA: Diagnosis not present

## 2022-01-20 DIAGNOSIS — I10 Essential (primary) hypertension: Secondary | ICD-10-CM | POA: Diagnosis not present

## 2022-01-20 DIAGNOSIS — N401 Enlarged prostate with lower urinary tract symptoms: Secondary | ICD-10-CM

## 2022-01-20 DIAGNOSIS — J069 Acute upper respiratory infection, unspecified: Secondary | ICD-10-CM | POA: Diagnosis not present

## 2022-01-20 DIAGNOSIS — Z794 Long term (current) use of insulin: Secondary | ICD-10-CM

## 2022-01-20 DIAGNOSIS — Z79891 Long term (current) use of opiate analgesic: Secondary | ICD-10-CM

## 2022-01-20 DIAGNOSIS — Z720 Tobacco use: Secondary | ICD-10-CM

## 2022-01-20 DIAGNOSIS — N400 Enlarged prostate without lower urinary tract symptoms: Secondary | ICD-10-CM | POA: Insufficient documentation

## 2022-01-20 DIAGNOSIS — M069 Rheumatoid arthritis, unspecified: Secondary | ICD-10-CM | POA: Diagnosis not present

## 2022-01-20 DIAGNOSIS — E7800 Pure hypercholesterolemia, unspecified: Secondary | ICD-10-CM

## 2022-01-20 DIAGNOSIS — N5201 Erectile dysfunction due to arterial insufficiency: Secondary | ICD-10-CM

## 2022-01-20 DIAGNOSIS — E11618 Type 2 diabetes mellitus with other diabetic arthropathy: Secondary | ICD-10-CM | POA: Diagnosis not present

## 2022-01-20 DIAGNOSIS — B20 Human immunodeficiency virus [HIV] disease: Secondary | ICD-10-CM

## 2022-01-20 DIAGNOSIS — I2510 Atherosclerotic heart disease of native coronary artery without angina pectoris: Secondary | ICD-10-CM

## 2022-01-20 DIAGNOSIS — R351 Nocturia: Secondary | ICD-10-CM

## 2022-01-20 DIAGNOSIS — J309 Allergic rhinitis, unspecified: Secondary | ICD-10-CM

## 2022-01-20 MED ORDER — NITROGLYCERIN 0.4 MG SL SUBL: SUBLINGUAL_TABLET | SUBLINGUAL | 3 refills | Status: DC

## 2022-01-20 MED ORDER — METFORMIN HCL ER (MOD) 1000 MG PO TB24: 1000.0000 mg | ORAL_TABLET | Freq: Two times a day (BID) | ORAL | 1 refills | Status: DC

## 2022-01-20 MED ORDER — TADALAFIL 5 MG PO TABS: 5.0000 mg | ORAL_TABLET | Freq: Every day | ORAL | 0 refills | Status: DC

## 2022-01-20 MED ORDER — EMPAGLIFLOZIN 25 MG PO TABS: 25.0000 mg | ORAL_TABLET | Freq: Every day | ORAL | 1 refills | Status: DC

## 2022-01-20 MED ORDER — LANTUS SOLOSTAR 100 UNIT/ML ~~LOC~~ SOPN: 32.0000 [IU] | PEN_INJECTOR | Freq: Every day | SUBCUTANEOUS | 3 refills | Status: DC

## 2022-01-20 NOTE — Assessment & Plan Note (Addendum)
-   This problem is chronic and stable. ?- His pain is well controlled on his current regimen. The benefits of continuing his current regimen currently outweighs the risks and allows him to do his daily activities and his job. ?- Will continue with current regimen for now ? ?Plan ?- Follow-up UDS ?

## 2022-01-20 NOTE — Patient Instructions (Signed)
Thank you for your visit today! ? ?We talked about:  ? ?Your great job quitting smoking-  ? -if friends who smoke come over, you intend to           ask them to smoke outdoors ? - that by not smoking you are saving 400-500$ a month ? ? You are happy with your weight and are trying to eat lots of fruits , veggies, beans and whole grains ? ?That your blood sugars are elevated- to discussed with Dr. Dareen Piano... - options to lower them is  ?- to increase the Ozempic a little like 5 clicks each week to catch a reaction if that is what it came from  ?vs.  ?- to increase Lantus ? ?I will send a message to Camille(pharmacy technician)  about helping with the cost of your Synjardyt ? ? ?Goals to work on:  ? ?1- make a dental appointment ?2- Keep eating healthy and being active and taking good care of you! ? ?Please make a follow up in 6 months- around October or sooner if needed. ? Butch Penny ?([phone] ? ?

## 2022-01-20 NOTE — Assessment & Plan Note (Addendum)
Patient says he quit smoking on 4/1 has not smoked in 25 days. We congratulated him on his success. He is excited about how much money he is saving and believes his dogs like being around him more. Patient was prescribed Chantix nasal spray, but because it is $95 per month, he does not plan to continue using it. Since he quit smoking, he reports increased appetite and less strict dietary control, so encouraged patient to still consume foods he enjoys in moderation.  ? ?Patient last lung CT was over a year ago, so ordered today with plan to repeat annually. 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 Because he is >68 years old with smoking history, will need to consider screening for abdominal aortic aneurysm in the future. Will continue providing encouragement, and reminded patient that we are here to support however we can.  ? ?Plan ?- Follow-up progress at next appointment ?- Follow-up lung CT results ?- Consider ordering ultrasound for AAA screening at next appointment ?

## 2022-01-20 NOTE — Assessment & Plan Note (Signed)
BP elevated to 143/71 on initial check today. Repeat 125/76. He says readings usually high on initial check. Reports taking the medications as prescribed. He has not been checking his blood pressure at home because his machine broke years ago. Encouraged him that quitting smoking will have positive impacts on his blood pressure. ? ?Plan ?- Continue Coreg 3.'125mg'$  twice daily with meals ?- Continue Hyzaar 50-12.'5mg'$  daily ?- Continue terazosin '2mg'$  daily  ?

## 2022-01-20 NOTE — Assessment & Plan Note (Signed)
Patient is doing well adhering to regimen. Continues being followed by Dr. Johnnye Sima. No concerns today. ?

## 2022-01-20 NOTE — Progress Notes (Signed)
? ?This is a Careers information officer Note.  The care of the patient was discussed with Dr. Aldine Contes, MD and the assessment and plan was formulated with their assistance.  Please see their note for official documentation of the patient encounter.  ? ?Subjective:  ? ?Patient ID: Riley Jones male   DOB: 12/31/1955 66 y.o.   MRN: [id number] ? ?HPI: ?[name] is a 66 y.o. with PMHx of HTN, TIIDM, hypercholesterolemia, HIV, BPH who presents for diabetes follow-up. Please see problem-based charting for full assessment and plan. ? ? ?Past Medical History:  ?Diagnosis Date  ? Anxiety   ? Arthritis   ? "pretty much all over"  ? Asthma   ? "mild" (07/17/2014)  ? Atypical angina ([place])   ? Myoview 2003, Normal EF 64%  ? CAD (coronary artery disease)   ? a. 10/20 DES to 80% mid LAD  ? Candida rash of groin 11/03/2019  ? COPD (chronic obstructive pulmonary disease) ([place])   ? "mild" (07/17/2014)  ? Depression   ? Fatigue 06/22/2019  ? Foot pain 03/30/2011  ? GERD (gastroesophageal reflux disease)   ? H/O hiatal hernia   ? Headache   ? "weekly" (06/2014)  ? History of gout   ? HIV (human immunodeficiency virus infection) ([place]) 1995  ? DX after shingles followed by Dr. Ola Spurr ([place])  ? Hx of cardiovascular stress test   ? ETT-Myoview (3/16):  Ex 10:15, No Ischemia, EF 57%; Normal Study  ? Hyperlipidemia   ? Hypertension   ? Well Controlled off meds  ? Increased anion gap metabolic acidosis 5/45/6256  ? Kidney stones   ? "passed them all"  ? Left lower quadrant abdominal pain 11/02/2019  ? Other and unspecified angina pectoris 12/18/2008  ? Overview:  Overview:  Greatly appreciate PCP f/u.   Last Assessment & Plan:  - Patient with episode of CP - 4 days, constant, substernal, not immediately resolved with NTG, felt better after burping and passing gas - Repeat EKG done here with no acute ST/T wave changes - CP now completely resolved. Encouraged patient to call 911 if recurs - To f/u with cardio in the next 2 weeks for further  assess  ? Pneumonia 4-5 times  ? Rheumatoid arthritis(714.0)   ? Followed by Dr. Charlestine Night, off MTX and prdnisone since 2007  ? Right hip pain 04/25/2019  ? Right shoulder pain 01/19/2018  ? 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 Sleep apnea   ? does not wear mask (07/17/2014)  ? Type II diabetes mellitus ([place])   ? ?Current Outpatient Medications  ?Medication Sig Dispense Refill  ? empagliflozin (JARDIANCE) 25 MG TABS tablet Take 1 tablet (25 mg total) by mouth daily before breakfast. 90 tablet 1  ? metFORMIN (GLUMETZA) 1000 MG (MOD) 24 hr tablet Take 1 tablet (1,000 mg total) by mouth 2 (two) times daily with a meal. 180 tablet 1  ? albuterol (VENTOLIN HFA) 108 (90 Base) MCG/ACT inhaler Inhale 2 puffs into the lungs every 6 (six) hours as needed for wheezing or shortness of breath. 9 g 3  ? aspirin 81 MG EC tablet Take 81 mg by mouth daily.      ? atorvastatin (LIPITOR) 40 MG tablet Take 1 tablet (40 mg total) by mouth daily. 90 tablet 0  ? BD PEN NEEDLE NANO 2ND GEN 32G X 4 MM MISC USE UP TO 8 PER WEEK 100 each 1  ? betamethasone dipropionate 0.05 % cream Apply topically 2 (two) times  daily. 30 g 0  ? cabotegravir & rilpivirine ER (CABENUVA) 600 & 900 MG/3ML injection Inject 1 kit into the muscle every 30 (thirty) days. 6 mL 1  ? cabotegravir & rilpivirine ER (CABENUVA) 600 & 900 MG/3ML injection Inject 1 kit into the muscle every 2 (two) months. 6 mL 5  ? carvedilol (COREG) 3.125 MG tablet Take 1 tablet (3.125 mg total) by mouth 2 (two) times daily with a meal. 180 tablet 1  ? clopidogrel (PLAVIX) 75 MG tablet Take 1 tablet by mouth once daily 90 tablet 2  ? fluticasone (FLONASE) 50 MCG/ACT nasal spray Place 1 spray into both nostrils daily. 16 g 0  ? HYDROcodone-acetaminophen (NORCO) 7.5-325 MG tablet Take 1 tablet by mouth every 6 (six) hours as needed for moderate pain. 60 tablet 0  ? hydrOXYzine (ATARAX) 10 MG tablet TAKE 1 TABLET BY MOUTH THREE TIMES DAILY AS NEEDED FOR ITCHING 30 tablet 1  ?  insulin glargine (LANTUS SOLOSTAR) 100 UNIT/ML Solostar Pen Inject 32 Units into the skin daily at 10 pm. 15 mL 3  ? loratadine (CLARITIN) 10 MG tablet Take 1 tablet (10 mg total) by mouth daily. 90 tablet 3  ? losartan-hydrochlorothiazide (HYZAAR) 50-12.5 MG tablet Take 1 tablet by mouth daily. 90 tablet 1  ? Multiple Vitamins-Minerals (CENTRUM ADULTS PO) Take 1 tablet by mouth daily.    ? nitroGLYCERIN (NITROSTAT) 0.4 MG SL tablet PLACE 1 TABLET UNDER THE TONGUE EVERY 5 MINUTES AS NEEDED FOR CHEST PAIN 25 tablet 3  ? nystatin (NYSTATIN) powder Apply 1 application topically 2 (two) times daily. 45 g 1  ? omeprazole (PRILOSEC) 20 MG capsule TAKE 1 CAPSULE BY MOUTH AS NEEDED 90 capsule 3  ? tadalafil (CIALIS) 5 MG tablet Take 1 tablet (5 mg total) by mouth daily. 10 tablet 0  ? terazosin (HYTRIN) 2 MG capsule Take 1 capsule by mouth at bedtime 90 capsule 3  ? ?No current facility-administered medications for this visit.  ? ?Family History  ?Problem Relation Age of Onset  ? Osteoporosis Mother   ? Diabetes Mother   ? Heart disease Father   ? Heart attack Father   ? Diabetes Father   ? COPD Father   ? Heart attack Brother   ? Stroke Maternal Grandmother   ? Stroke Maternal Grandfather   ? Stroke Paternal Grandmother   ? Stroke Paternal Grandfather   ? ?Social History  ? ?Socioeconomic History  ? Marital status: Single  ?  Spouse name: Not on file  ? Number of children: Not on file  ? Years of education: 58  ? Highest education level: Not on file  ?Occupational History  ? Occupation: [employer]  ? Occupation: Administrator (retired)  ?Tobacco Use  ? Smoking status: Former  ?  Packs/day: 1.00  ?  Years: 46.00  ?  Pack years: 46.00  ?  Types: Cigarettes  ?  Quit date: 12/27/2021  ?  Years since quitting: 0.0  ? Smokeless tobacco: Never  ?Vaping Use  ? Vaping Use: Never used  ?Substance and Sexual Activity  ? Alcohol use: Not Currently  ?  Alcohol/week: 1.0 standard drink  ?  Types: 1 Glasses of wine per week  ?  Comment: 07/17/2014  "glass of wine once or twice/month"  ? Drug use: No  ? Sexual activity: Yes  ?  Partners: Male  ?  Comment: declined condoms 08/2019  ?Other Topics Concern  ? Not on file  ?Social History Narrative  ? [place] approved  beginning 12/11/2009 - 12/27/2010  ? [employer] benefits approved; patient eligible for 100% discount for out patient labs and office visits. Patient eligible for 70% discount for other services per Irish Elders 09/08/2010  ?   ? Patient is on disability, and walks 2-3 times per week.   ? ?Social Determinants of Health  ? ?Financial Resource Strain: Not on file  ?Food Insecurity: Not on file  ?Transportation Needs: Not on file  ?Physical Activity: Not on file  ?Stress: Not on file  ?Social Connections: Not on file  ? ?Review of Systems: ?Pertinent items are noted in HPI. ?Objective:  ?Physical Exam: ?Vitals:  ? 01/20/22 0911 01/20/22 1003  ?BP: (!) 143/71 125/62  ?Pulse: 73 63  ?Temp: 98.4 ?F (36.9 ?C)   ?TempSrc: Oral   ?SpO2: 98%   ?Weight: 194 lb 1.6 oz (88 kg)   ?Height: $RemoveB'5\' 7"'DqvXPKir$  (1.702 m)   ? ?BP 125/62 (BP Location: Right Arm, Patient Position: Sitting, Cuff Size: Small)   Pulse 63   Temp 98.4 ?F (36.9 ?C) (Oral)   Ht $R'5\' 7"'ey$  (1.702 m)   Wt 194 lb 1.6 oz (88 kg)   SpO2 98%   BMI 30.40 kg/m?  ? ?General Appearance:    Alert, cooperative, no distress  ?Head:    Normocephalic, atraumatic  ?Lungs:     Clear to auscultation bilaterally, respirations unlabored  ?Heart:    Regular rate and rhythm, S1 and S2 normal, no murmur, rub   or gallop  ?Abdomen:     Soft, non-tender, bowel sounds active all four quadrants  ?Extremities:   Extremities normal, no edema  ? ?Assessment & Plan:  ? ?See encounters tab for problem-based charting. ? ?Hypercholesterolemia ?Patient has chronic hypercholesterolemia. It appears he last had atorvastatin filled in October with a 90-day supply. Unsure about adherence to regimen. Will need to ask if he has been taking at follow-up visit in 1 month. ? ?Plan ?- Continue  atorvastatin $RemoveBefor'40mg'HBWDhNMtSJfa$  daily ? ?Essential hypertension ?BP elevated to 143/71 on initial check today. Repeat 125/76. He says readings usually high on initial check. Reports taking the medications as prescribed. He has not been

## 2022-01-20 NOTE — Assessment & Plan Note (Addendum)
Patient requested refill of nitroglycerin to use as needed. He says he has not used it in several years but recently lost the bottle. Because we are starting tadalafil '5mg'$  daily, reminded him if he has already taken tadalafil on a day but begins having chest pain, he still should not take nitroglycerin because it could cause an unsafe drop in blood pressure. ? ?- Refill of nitroglycerin 0.'4mg'$  SL tablets ordered ?

## 2022-01-20 NOTE — Patient Instructions (Addendum)
It was very nice to see you in clinic today. Here are a few reminders from what we discussed: ? ?We are so proud of you for going 25 days without smoking! Keep up the great work!! ?We are ordering a CT scan of your lungs. Because you smoked in the past, this will be checked yearly. ?Your sugars have been running high! We will need you to increase the insulin lantus to 32 units. ?We will start Metformin '1000mg'$  two times daily AND empagliflozin '25mg'$  daily for your blood sugar. To reduce cost, we will stop the Syngardy combination pill. ?Please continue the Ozempic '1mg'$  daily. Let us know if you notice the rash returns. ?You are doing great monitoring your sugars at home. Please continue using the meter. ?We will have you start taking tadalafil '5mg'$  once daily for your prostate. You were already taking this medication for erectile dysfunction. ?We have reordered the Nitroglycerin. If you have already taken the Tadalafil for the day, please do not take the Nitroglycerin, even if you have chest pain. This can cause an unsafe drop in blood pressure. ? ?We ask you to please follow-up in 1 month. We will discuss the medication changes and your sugars at that time. Please let us know if you have any questions or concerns before then!  ?

## 2022-01-20 NOTE — Progress Notes (Signed)
? ?Diabetes Self-Management Education ? ?Visit Type:  Annual Follow-Up ? ?Appt. Start Time: 815 Appt. End Time: 458 ? ?01/20/2022 ? ?Riley. Riley Jones, identified by name and date of birth, is a 66 y.o. male with a diagnosis of Diabetes:  .   ?ASSESSMENT ? ?Riley Jones is concerned about his medications today: Synjardy >200$, he is having a difficult time affording this. Chat sent to pahrmacy tech for assistance ?ozempic 2 mg weekly stopped due to itching, but he is  back on now at 1 mg weekly ?Lantus- 26 units. He reports  Recent antibiotics, recent prednisone, neither one did he finish. He also reports trying to stop his prilosec and having bad heartburn. We talked about trying to hold his MVI with minerals for a while to see if that is the cause.  ? ?Saw regular eye doctor then was set to retinal specialist- Dr Jalene Mullet ?Weight 194 lb 1.6 oz (88 kg). ?Body mass index is 27.85 kg/m?. ? ?Lab Results  ?Component Value Date  ? HGBA1C 9.2 (A) 12/30/2021  ? HGBA1C 9.3 (A) 10/01/2021  ? HGBA1C 7.4 (A) 07/01/2021  ? HGBA1C 8.2 (A) 01/21/2021  ? HGBA1C 8.8 (A) 11/28/2019  ?  ?Wt Readings from Last 10 Encounters:  ?01/20/22 194 lb 1.6 oz (88 kg)  ?01/20/22 194 lb 1.6 oz (88 kg)  ?12/30/21 192 lb 6.4 oz (87.3 kg)  ?12/05/21 196 lb (88.9 kg)  ?12/05/21 195 lb (88.5 kg)  ?11/25/21 191 lb 6.4 oz (86.8 kg)  ?10/01/21 191 lb 4.8 oz (86.8 kg)  ?07/01/21 191 lb 8 oz (86.9 kg)  ?05/30/21 188 lb 12.8 oz (85.6 kg)  ?04/23/21 190 lb (86.2 kg)  ? ?BP Readings from Last 3 Encounters:  ?01/20/22 125/62  ?12/30/21 (!) 142/65  ?12/05/21 131/76  ? ? ?  ? Diabetes Self-Management Education - 01/20/22 1000   ? ?  ? Health Coping  ? How would you rate your overall health? Good   ?  ? Psychosocial Assessment  ? Patient Belief/Attitude about Diabetes Motivated to manage diabetes   ? What is the hardest part about your diabetes right now, causing you the most concern, or is the most worrisome to you about your diabetes?   Taking/obtaining  medications   see notes  ? Self-care barriers Lack of material resources   ? Self-management support Doctor's office;CDE visits   ? Patient Concerns Medication;Healthy Lifestyle   ? Special Needs None   ? Preferred Learning Style No preference indicated   ? Learning Readiness Contemplating   ?  ? Pre-Education Assessment  ? Patient understands the diabetes disease and treatment process. Demonstrates understanding / competency   ? Patient understands incorporating nutritional management into lifestyle. Demonstrates understanding / competency   ? Patient undertands incorporating physical activity into lifestyle. Demonstrates understanding / competency   ? Patient understands using medications safely. Demonstrates understanding / competency   ? Patient understands monitoring blood glucose, interpreting and using results Demonstrates understanding / competency   ? Patient understands prevention, detection, and treatment of acute complications. Demonstrates understanding / competency   ? Patient understands prevention, detection, and treatment of chronic complications. Demonstrates understanding / competency   ? Patient understands how to develop strategies to address psychosocial issues. Demonstrates understanding / competency   ? Patient understands how to develop strategies to promote health/change behavior. Needs Review   ?  ? Complications  ? Last HgB A1C per patient/outside source 9.2 %   ? How often do you check your blood  sugar? > 4 times/day   ? Fasting Blood glucose range (mg/dL) >200   ? Postprandial Blood glucose range (mg/dL) >200   ? Number of hypoglycemic episodes per month 0   ? Can you tell when your blood sugar is high? Yes   ? Number of hyperglycemic episodes ( >'200mg'$ /dL): Daily   ? What do you do if your blood sugar is high? takes medicine as directed   ? Have you had a dilated eye exam in the past 12 months? Yes   ? Have you had a dental exam in the past 12 months? No   ? Are you checking your feet?  Yes   ? How many days per week are you checking your feet? 7   ?  ? Dietary Intake  ? Breakfast milk 2%, hash browns, eggs, biscuit & gravy   ? Lunch peanut butter sandwich and milk   ? Snack (afternoon) chocoloate covered mints   ? Dinner chicken in Information systems manager, salads, beans, berries   ? Snack (evening) pie or banana bread   ? Beverage(s) water with non-nutritive sweetener,milk 2-3 gallons per week   less coffee and sweet tea  ?  ? Activity / Exercise  ? Activity / Exercise Type ADL's;Light (walking / raking leaves)   ? How many days per week to you exercise? 5   ? How many minutes per day do you exercise? 60   ? Total minutes per week of exercise 300   ?  ? Patient Education  ? Previous Diabetes Education Yes (please comment)   here  ? Medications Reviewed patients medication for diabetes, action, purpose, timing of dose and side effects.   ? Chronic complications Dental care   ?  ? Individualized Goals (developed by patient)  ? Reducing Risk Other (comment)   make dental appointment  ?  ? Patient Self-Evaluation of Goals - Patient rates self as meeting previously set goals (% of time)  ? Reducing Risk (treating acute and chronic complications) >86% (most of the time)   has had eye exams  ?  ? Post-Education Assessment  ? Patient understands using medications safely. Demonstrates understanding / competency   ? Patient understands monitoring blood glucose, interpreting and using results Demonstrates understanding / competency   ? Patient understands prevention, detection, and treatment of chronic complications. Demonstrates understanding / competency   ?  ? Outcomes  ? Program Status Completed   ?  ? Subsequent Visit  ? Since your last visit have you continued or begun to take your medications as prescribed? No   see notes  ? Since your last visit have you had your blood pressure checked? Yes   ? Is your most recent blood pressure lower, unchanged, or higher since your last visit? Lower   ? Since your last visit  have you experienced any weight changes? No change   ? Since your last visit, are you checking your blood glucose at least once a day? Yes   likes his continuous glucose monitor  ? ?  ?  ? ?  ? ? ?Learning Objective:  Patient will have a greater understanding of diabetes self-management. ?Patient education plan is to attend individual and/or group sessions per assessed needs and concerns. ? ? ?Plan:  ? ?Patient Instructions  ?Thank you for your visit today! ? ?We talked about:  ? ?Your great job quitting smoking-  ? -if friends who smoke come over, you intend to  ask them to smoke outdoors ? - that by not smoking you are saving 400-500$ a month ? ? You are happy with your weight and are trying to eat lots of fruits , veggies, beans and whole grains ? ?That your blood sugars are elevated- to discussed with Dr. Dareen Piano... - options to lower them is  ?- to increase the Ozempic a little like 5 clicks each week to catch a reaction if that is what it came from  ?vs.  ?- to increase Lantus ? ?I will send a message to Camille(pharmacy technician)  about helping with the cost of your Synjardyt ? ? ?Goals to work on:  ? ?1- make a dental appointment ?2- Keep eating healthy and being active and taking good care of you! ? ?Please make a follow up in 6 months- around October or sooner if needed. ? Butch Penny ?[phone] ? ? ? ?Expected Outcomes:  Demonstrated interest in learning. Expect positive outcomes ? ?Education material provided: Diabetes Resources ? ?If problems or questions, patient to contact team via:  Phone ? ?Future DSME appointment: - 6 months-12 months ?Debera Lat, RD ?01/20/2022 ?11:11 AM. ? ? ?

## 2022-01-20 NOTE — Assessment & Plan Note (Addendum)
Diabetes uncontrolled. According to [facility] log assessing past two weeks, readings within target range 2% of the time, and 80% of readings have been very high. Patient says since quitting smoking, he has not been as strict with his diet and has increased appetite. Endorsed eating many candies and fruits and encouraged patient to do so in moderation. Most recent A1c was 9.2 on 12/30/21. ? ?He has been taking Synjardy 06-999 BID but reports it is too expensive at ~$200 per month. He has also tolerated ozempic '1mg'$  for ~3 weeks without return of rash that he experienced with '2mg'$  dose. Will continue ozempic '1mg'$ . Will stop the Syngardy combination pill and start Empagliflozin and Metformin separately.  ? ?Plan ?- Stop Synjardy ?- Start Metformin '1000mg'$  BID ?- Start Empagliflozin '25mg'$  daily  ?- Increase lantus to 32 units qhs ?- Continue ozempic 1 mg weekly ?- Continue home blood sugar monitoring ?- Encouraged patient to reduce sugary food intake ?- Return in 1 month to discuss sugars ?- Repeat A1c in 2 months ?

## 2022-01-20 NOTE — Assessment & Plan Note (Signed)
Patient says cough and cold symptoms have resolved. On exam, lungs clear to auscultation and no difficulty breathing. ?

## 2022-01-20 NOTE — Assessment & Plan Note (Addendum)
Patient has BPH. Patient reports waking up every 2 hours to urinate nightly. He has been taking tadalafil as needed for erectile dysfunction, but planning to start daily tadalafil for prostate as well. Because patient requested refill of nitroglycerin, reminded him if he has already taken the tadalafil on a day but begins having chest pain, he still should not take nitroglycerin because it could cause an unsafe drop in blood pressure. ? ?Plan ?- Start tadalafil '5mg'$  daily ?

## 2022-01-20 NOTE — Assessment & Plan Note (Signed)
Patient has chronic hypercholesterolemia. It appears he last had atorvastatin filled in October with a 90-day supply. Unsure about adherence to regimen. Will need to ask if he has been taking at follow-up visit in 1 month. ? ?Plan ?- Continue atorvastatin '40mg'$  daily ?

## 2022-01-20 NOTE — Assessment & Plan Note (Signed)
Patient says he has not seen a rheumatologist since last Fall. At that time, he says he was told he "technically does not have RA and only has the markers". He says this doctor retires and the drive was far, so he has not scheduled with anyone else. He also says he is not interested in seeing another doctor at this time because he says he cannot afford new doctors. 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 He says because of his arthritis, he drops items more, but reports no other issues at this time. Will continue to monitor. ?

## 2022-01-20 NOTE — Assessment & Plan Note (Addendum)
-  Patient has a history of depression, which is stable at this time.  ?- Not on any medications currently ?-PHQ-9 score is 3 today. ?

## 2022-01-21 ENCOUNTER — Telehealth: Payer: Self-pay

## 2022-01-21 ENCOUNTER — Other Ambulatory Visit (HOSPITAL_COMMUNITY): Payer: Self-pay

## 2022-01-21 DIAGNOSIS — H43392 Other vitreous opacities, left eye: Secondary | ICD-10-CM | POA: Diagnosis not present

## 2022-01-21 DIAGNOSIS — H35432 Paving stone degeneration of retina, left eye: Secondary | ICD-10-CM | POA: Diagnosis not present

## 2022-01-21 DIAGNOSIS — E113211 Type 2 diabetes mellitus with mild nonproliferative diabetic retinopathy with macular edema, right eye: Secondary | ICD-10-CM | POA: Diagnosis not present

## 2022-01-21 DIAGNOSIS — H33322 Round hole, left eye: Secondary | ICD-10-CM | POA: Diagnosis not present

## 2022-01-21 DIAGNOSIS — H43812 Vitreous degeneration, left eye: Secondary | ICD-10-CM | POA: Diagnosis not present

## 2022-01-21 NOTE — Assessment & Plan Note (Signed)
-   Patient has been on tadalafil prn for erectile dysfunction with good response ?- Will be starting him on tadalafil 5 mg daily for BPH which should also be effective for his ED ?- Will follow up at next visit to assess the efficacy of the new regimen ?

## 2022-01-21 NOTE — Telephone Encounter (Signed)
Thank you :)

## 2022-01-21 NOTE — Assessment & Plan Note (Signed)
-   Patient is due for annual lung cancer screening given his smoking history ?- This was ordered today. Will follow up results ?

## 2022-01-21 NOTE — Telephone Encounter (Signed)
Reached out to pt regarding possible assistance for Synjardy. ? ?Pt is going to apply for Low Income Subsidy/Extra help with medicare. If denied, he will reach back out to me & will move forward with [facility] patient assistance. If approved, pt aware his pharmacy copays should drop and become more affordable. ?

## 2022-01-21 NOTE — Progress Notes (Signed)
Attestation for Student Documentation: ? ?I personally was present and performed or re-performed the history, physical exam and medical decision-making activities of this service and have verified that the service and findings are accurately documented in the student?s note. ? ?Aldine Contes, MD ?01/21/2022, 12:10 PM ? ?

## 2022-01-22 ENCOUNTER — Other Ambulatory Visit (HOSPITAL_COMMUNITY): Payer: Self-pay

## 2022-01-23 ENCOUNTER — Telehealth: Payer: Self-pay

## 2022-01-23 NOTE — Telephone Encounter (Signed)
[facility] Patient Advocate Encounter ? ?Patient's medication (cabenuva) have been couriered to [facility] from [place] and will be administered on the patient next office visit on 01/30/22. ? ?Ileene Patrick , CPhT ?Specialty Pharmacy Patient Advocate ?[facility] ?Phone: [phone] ?Fax:  [phone]  ?

## 2022-01-25 LAB — TOXASSURE SELECT,+ANTIDEPR,UR

## 2022-01-28 DIAGNOSIS — H33322 Round hole, left eye: Secondary | ICD-10-CM | POA: Diagnosis not present

## 2022-01-30 ENCOUNTER — Other Ambulatory Visit: Payer: Self-pay

## 2022-01-30 ENCOUNTER — Encounter: Payer: Self-pay | Admitting: Internal Medicine

## 2022-01-30 ENCOUNTER — Ambulatory Visit (INDEPENDENT_AMBULATORY_CARE_PROVIDER_SITE_OTHER): Payer: Medicare HMO | Admitting: Pharmacist

## 2022-01-30 ENCOUNTER — Ambulatory Visit (INDEPENDENT_AMBULATORY_CARE_PROVIDER_SITE_OTHER): Payer: Medicare HMO | Admitting: Internal Medicine

## 2022-01-30 DIAGNOSIS — Z0000 Encounter for general adult medical examination without abnormal findings: Secondary | ICD-10-CM | POA: Diagnosis not present

## 2022-01-30 DIAGNOSIS — B20 Human immunodeficiency virus [HIV] disease: Secondary | ICD-10-CM | POA: Diagnosis not present

## 2022-01-30 MED ORDER — CABOTEGRAVIR & RILPIVIRINE ER 600 & 900 MG/3ML IM SUER
1.0000 | Freq: Once | INTRAMUSCULAR | Status: AC
  Administered 2022-01-30: 1 via INTRAMUSCULAR

## 2022-01-30 NOTE — Patient Instructions (Signed)
Health Maintenance, Male Adopting a healthy lifestyle and getting preventive care are important in promoting health and wellness. Ask your health care provider about: The right schedule for you to have regular tests and exams. Things you can do on your own to prevent diseases and keep yourself healthy. What should I know about diet, weight, and exercise? Eat a healthy diet  Eat a diet that includes plenty of vegetables, fruits, low-fat dairy products, and lean protein. Do not eat a lot of foods that are high in solid fats, added sugars, or sodium. Maintain a healthy weight Body mass index (BMI) is a measurement that can be used to identify possible weight problems. It estimates body fat based on height and weight. Your health care provider can help determine your BMI and help you achieve or maintain a healthy weight. Get regular exercise Get regular exercise. This is one of the most important things you can do for your health. Most adults should: Exercise for at least 150 minutes each week. The exercise should increase your heart rate and make you sweat (moderate-intensity exercise). Do strengthening exercises at least twice a week. This is in addition to the moderate-intensity exercise. Spend less time sitting. Even light physical activity can be beneficial. Watch cholesterol and blood lipids Have your blood tested for lipids and cholesterol at 68 years of age, then have this test every 5 years. You may need to have your cholesterol levels checked more often if: Your lipid or cholesterol levels are high. You are older than 68 years of age. You are at high risk for heart disease. What should I know about cancer screening? Many types of cancers can be detected early and may often be prevented. Depending on your health history and family history, you may need to have cancer screening at various ages. This may include screening for: Colorectal cancer. Prostate cancer. Skin cancer. Lung  cancer. What should I know about heart disease, diabetes, and high blood pressure? Blood pressure and heart disease High blood pressure causes heart disease and increases the risk of stroke. This is more likely to develop in people who have high blood pressure readings or are overweight. Talk with your health care provider about your target blood pressure readings. Have your blood pressure checked: Every 3-5 years if you are 18-39 years of age. Every year if you are 40 years old or older. If you are between the ages of 65 and 75 and are a current or former smoker, ask your health care provider if you should have a one-time screening for abdominal aortic aneurysm (AAA). Diabetes Have regular diabetes screenings. This checks your fasting blood sugar level. Have the screening done: Once every three years after age 45 if you are at a normal weight and have a low risk for diabetes. More often and at a younger age if you are overweight or have a high risk for diabetes. What should I know about preventing infection? Hepatitis B If you have a higher risk for hepatitis B, you should be screened for this virus. Talk with your health care provider to find out if you are at risk for hepatitis B infection. Hepatitis C Blood testing is recommended for: Everyone born from 1945 through 1965. Anyone with known risk factors for hepatitis C. Sexually transmitted infections (STIs) You should be screened each year for STIs, including gonorrhea and chlamydia, if: You are sexually active and are younger than 68 years of age. You are older than 68 years of age and your   health care provider tells you that you are at risk for this type of infection. Your sexual activity has changed since you were last screened, and you are at increased risk for chlamydia or gonorrhea. Ask your health care provider if you are at risk. Ask your health care provider about whether you are at high risk for HIV. Your health care provider  may recommend a prescription medicine to help prevent HIV infection. If you choose to take medicine to prevent HIV, you should first get tested for HIV. You should then be tested every 3 months for as long as you are taking the medicine. Follow these instructions at home: Alcohol use Do not drink alcohol if your health care provider tells you not to drink. If you drink alcohol: Limit how much you have to 0-2 drinks a day. Know how much alcohol is in your drink. In the [place] one drink equals one 12 oz bottle of beer (355 mL), one 5 oz glass of wine (148 mL), or one 1 oz glass of hard liquor (44 mL). Lifestyle Do not use any products that contain nicotine or tobacco. These products include cigarettes, chewing tobacco, and vaping devices, such as e-cigarettes. If you need help quitting, ask your health care provider. Do not use street drugs. Do not share needles. Ask your health care provider for help if you need support or information about quitting drugs. General instructions Schedule regular health, dental, and eye exams. Stay current with your vaccines. Tell your health care provider if: You often feel depressed. You have ever been abused or do not feel safe at home. Summary Adopting a healthy lifestyle and getting preventive care are important in promoting health and wellness. Follow your health care provider's instructions about healthy diet, exercising, and getting tested or screened for diseases. Follow your health care provider's instructions on monitoring your cholesterol and blood pressure. This information is not intended to replace advice given to you by your health care provider. Make sure you discuss any questions you have with your health care provider. Document Revised: 02/03/2021 Document Reviewed: 02/03/2021 Elsevier Patient Education  2023 Elsevier Inc.  

## 2022-01-30 NOTE — Progress Notes (Addendum)
? ?HPI: Riley Jones is a 66 y.o. male who presents to the [facility] for [place] administration. ? ?Patient Active Problem List  ? Diagnosis Date Noted  ? BPH (benign prostatic hyperplasia) 01/20/2022  ? Upper respiratory infection 12/30/2021  ? Lung nodule 10/17/2020  ? Psoriasis 11/02/2019  ? Abdominal hernia 03/22/2019  ? GERD (gastroesophageal reflux disease) 12/01/2016  ? Chronic allergic rhinitis 08/25/2016  ? Drug-induced skin rash 09/16/2015  ? Chronic prescription opiate use 11/22/2014  ? CAD (coronary artery disease) 09/05/2014  ? Tobacco abuse 07/05/2014  ? Diarrhea 03/09/2014  ? Anxiety 12/15/2012  ? Preventative health care 12/15/2012  ? COPD (chronic obstructive pulmonary disease) ([place]) 12/04/2012  ? Erectile dysfunction 10/21/2011  ? Insomnia 08/03/2011  ? Rheumatoid arthritis ([place]) 02/03/2011  ? Hypercholesterolemia 10/11/2006  ? Human immunodeficiency virus (HIV) disease ([place]) 07/08/2006  ? Diabetes mellitus ([place]) 07/08/2006  ? Depression 07/08/2006  ? Carpal tunnel syndrome 07/08/2006  ? Essential hypertension 07/08/2006  ? ? ?Patient's Medications  ?New Prescriptions  ? No medications on file  ?Previous Medications  ? ALBUTEROL (VENTOLIN HFA) 108 (90 BASE) MCG/ACT INHALER    Inhale 2 puffs into the lungs every 6 (six) hours as needed for wheezing or shortness of breath.  ? ASPIRIN 81 MG EC TABLET    Take 81 mg by mouth daily.    ? ATORVASTATIN (LIPITOR) 40 MG TABLET    Take 1 tablet (40 mg total) by mouth daily.  ? BD PEN NEEDLE NANO 2ND GEN 32G X 4 MM MISC    USE UP TO 8 PER WEEK  ? BETAMETHASONE DIPROPIONATE 0.05 % CREAM    Apply topically 2 (two) times daily.  ? CABOTEGRAVIR & RILPIVIRINE ER (CABENUVA) 600 & 900 MG/3ML INJECTION    Inject 1 kit into the muscle every 30 (thirty) days.  ? CABOTEGRAVIR & RILPIVIRINE ER (CABENUVA) 600 & 900 MG/3ML INJECTION    Inject 1 kit into the muscle every 2 (two) months.  ? CARVEDILOL (COREG) 3.125 MG TABLET    Take 1 tablet (3.125 mg total) by  mouth 2 (two) times daily with a meal.  ? CLOPIDOGREL (PLAVIX) 75 MG TABLET    Take 1 tablet by mouth once daily  ? EMPAGLIFLOZIN (JARDIANCE) 25 MG TABS TABLET    Take 1 tablet (25 mg total) by mouth daily before breakfast.  ? FLUTICASONE (FLONASE) 50 MCG/ACT NASAL SPRAY    Place 1 spray into both nostrils daily.  ? HYDROCODONE-ACETAMINOPHEN (NORCO) 7.5-325 MG TABLET    Take 1 tablet by mouth every 6 (six) hours as needed for moderate pain.  ? HYDROXYZINE (ATARAX) 10 MG TABLET    TAKE 1 TABLET BY MOUTH THREE TIMES DAILY AS NEEDED FOR ITCHING  ? INSULIN GLARGINE (LANTUS SOLOSTAR) 100 UNIT/ML SOLOSTAR PEN    Inject 32 Units into the skin daily at 10 pm.  ? LORATADINE (CLARITIN) 10 MG TABLET    Take 1 tablet (10 mg total) by mouth daily.  ? LOSARTAN-HYDROCHLOROTHIAZIDE (HYZAAR) 50-12.5 MG TABLET    Take 1 tablet by mouth daily.  ? METFORMIN (GLUCOPHAGE-XR) 500 MG 24 HR TABLET    Take 2 tablets (1,000 mg total) by mouth 2 (two) times daily with a meal.  ? MULTIPLE VITAMINS-MINERALS (CENTRUM ADULTS PO)    Take 1 tablet by mouth daily.  ? NITROGLYCERIN (NITROSTAT) 0.4 MG SL TABLET    PLACE 1 TABLET UNDER THE TONGUE EVERY 5 MINUTES AS NEEDED FOR CHEST PAIN  ? NYSTATIN (NYSTATIN) POWDER  Apply 1 application topically 2 (two) times daily.  ? OMEPRAZOLE (PRILOSEC) 20 MG CAPSULE    TAKE 1 CAPSULE BY MOUTH AS NEEDED  ? TADALAFIL (CIALIS) 5 MG TABLET    Take 1 tablet (5 mg total) by mouth daily.  ? TERAZOSIN (HYTRIN) 2 MG CAPSULE    Take 1 capsule by mouth at bedtime  ?Modified Medications  ? No medications on file  ?Discontinued Medications  ? No medications on file  ? ? ?Allergies: ?Allergies  ?Allergen Reactions  ? Penicillins Hives, Nausea Only and Rash  ?  Has patient had a PCN reaction causing immediate rash, facial/tongue/throat swelling, SOB or lightheadedness with hypotension: Yes ?Has patient had a PCN reaction causing severe rash involving mucus membranes or skin necrosis: No ?Has patient had a PCN reaction that  required hospitalization: No ?Has patient had a PCN reaction occurring within the last 10 years: No ?If all of the above answers are "NO", then may proceed with Cephalosporin use. ?  ? Zoloft [Sertraline Hcl] Other (See Comments)  ?  Patient reported Psychomotor slowing / worsened depression  ? Victoza [Liraglutide]   ?  Did not tolerate this medication.  Complained of dizziness even at the lowest dose  ? Chantix [Varenicline] Hives  ? Lisinopril Cough  ? Ozempic (0.25 Or 0.5 Mg-Dose) [Semaglutide(0.25 Or 0.37m-Dos)] Itching and Rash  ?  Rash to chest wall.  ? TKarolee OhsDegludec] Rash  ? ? ?Past Medical History: ?Past Medical History:  ?Diagnosis Date  ? Anxiety   ? Arthritis   ? "pretty much all over"  ? Asthma   ? "mild" (07/17/2014)  ? Atypical angina ([place]   ? Myoview 2003, Normal EF 64%  ? CAD (coronary artery disease)   ? a. 10/20 DES to 80% mid LAD  ? Candida rash of groin 11/03/2019  ? COPD (chronic obstructive pulmonary disease) ([place]   ? "mild" (07/17/2014)  ? Depression   ? Fatigue 06/22/2019  ? Foot pain 03/30/2011  ? GERD (gastroesophageal reflux disease)   ? H/O hiatal hernia   ? Headache   ? "weekly" (06/2014)  ? History of gout   ? HIV (human immunodeficiency virus infection) ([place] 1995  ? DX after shingles followed by Dr. FOla Spurr(ID)  ? Hx of cardiovascular stress test   ? ETT-Myoview (3/16):  Ex 10:15, No Ischemia, EF 57%; Normal Study  ? Hyperlipidemia   ? Hypertension   ? Well Controlled off meds  ? Increased anion gap metabolic acidosis 49/45/0388 ? Kidney stones   ? "passed them all"  ? Left lower quadrant abdominal pain 11/02/2019  ? Other and unspecified angina pectoris 12/18/2008  ? Overview:  Overview:  Greatly appreciate PCP f/u.   Last Assessment & Plan:  - Patient with episode of CP - 4 days, constant, substernal, not immediately resolved with NTG, felt better after burping and passing gas - Repeat EKG done here with no acute ST/T wave changes - CP now completely resolved. Encouraged  patient to call 911 if recurs - To f/u with cardio in the next 2 weeks for further assess  ? Pneumonia 4-5 times  ? Rheumatoid arthritis(714.0)   ? Followed by Dr. TCharlestine Night off MTX and prdnisone since 2007  ? Right hip pain 04/25/2019  ? Right shoulder pain 01/19/2018  ? 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 Sleep apnea   ? does not wear mask (07/17/2014)  ? Type II diabetes mellitus ([place]   ? ? ?Social History: ?Social  History  ? ?Socioeconomic History  ? Marital status: Single  ?  Spouse name: Not on file  ? Number of children: Not on file  ? Years of education: 62  ? Highest education level: Not on file  ?Occupational History  ? Occupation: [employer]  ? Occupation: Administrator (retired)  ?Tobacco Use  ? Smoking status: Former  ?  Packs/day: 1.00  ?  Years: 46.00  ?  Pack years: 46.00  ?  Types: Cigarettes  ?  Quit date: 12/27/2021  ?  Years since quitting: 0.0  ? Smokeless tobacco: Never  ?Vaping Use  ? Vaping Use: Never used  ?Substance and Sexual Activity  ? Alcohol use: Not Currently  ?  Alcohol/week: 1.0 standard drink  ?  Types: 1 Glasses of wine per week  ?  Comment: 07/17/2014 "glass of wine once or twice/month"  ? Drug use: No  ? Sexual activity: Yes  ?  Partners: Male  ?  Comment: declined condoms 08/2019  ?Other Topics Concern  ? Not on file  ?Social History Narrative  ? [place] beginning 12/11/2009 - 12/27/2010  ? [employer] benefits approved; patient eligible for 100% discount for out patient labs and office visits. Patient eligible for 70% discount for other services per [place] [employer] 09/08/2010  ?   ? Patient is on disability, and walks 2-3 times per week.   ? ?Social Determinants of Health  ? ?Financial Resource Strain: Not on file  ?Food Insecurity: Not on file  ?Transportation Needs: Not on file  ?Physical Activity: Not on file  ?Stress: Not on file  ?Social Connections: Not on file  ? ? ?Labs: ?Lab Results  ?Component Value Date  ? HIV1RNAQUANT Not Detected 10/07/2021  ?  HIV1RNAQUANT <20 (H) 08/08/2021  ? HIV1RNAQUANT <20 (H) 05/16/2021  ? CD4TABS 848 06/11/2020  ? CD4TABS 615 08/09/2019  ? CD4TABS 760 10/25/2018  ? ? ?RPR and STI ?Lab Results  ?Component Value Date  ? LABRPR NON

## 2022-01-30 NOTE — Progress Notes (Signed)
Subjective:   Riley Jones is a 66 y.o. male who presents for an Initial Medicare Annual Wellness Visit. I connected with  [name] on 01/30/22 by a audio enabled telemedicine application and verified that I am speaking with the correct person using two identifiers.  Patient Location: Home  Provider Location: Office/Clinic  I discussed the limitations of evaluation and management by telemedicine. The patient expressed understanding and agreed to proceed.   Review of Systems    DEFERRED TO PCP        Objective:    There were no vitals filed for this visit. There is no height or weight on file to calculate BMI.     01/20/2022    9:12 AM 12/05/2021   10:56 AM 11/25/2021    1:57 PM 10/01/2021    3:41 PM 07/01/2021   10:29 AM 01/21/2021    9:13 AM 02/20/2020    1:08 PM  Advanced Directives  Does Patient Have a Medical Advance Directive? _0  Yes Yes  Type of Paramedic of [place];Living will [place];Living will [place];Living will [place];Living will [place];Living will [place];Living will [place];Living will  Does patient want to make changes to medical advance directive? No - Patient declined    No - Patient declined No - Patient declined   Copy of [place] in Chart? No - copy requested No - copy requested No - copy requested No - copy requested No - copy requested No - copy requested No - copy requested  Would patient like information on creating a medical advance directive?       No - Patient declined    Current Medications (verified) Outpatient Encounter Medications as of 01/30/2022  Medication Sig   albuterol (VENTOLIN HFA) 108 (90 Base) MCG/ACT inhaler Inhale 2 puffs into the lungs every 6 (six) hours as needed for wheezing or shortness of breath.   aspirin 81 MG EC tablet Take 81 mg by  mouth daily.     atorvastatin (LIPITOR) 40 MG tablet Take 1 tablet (40 mg total) by mouth daily.   BD PEN NEEDLE NANO 2ND GEN 32G X 4 MM MISC USE UP TO 8 PER WEEK   betamethasone dipropionate 0.05 % cream Apply topically 2 (two) times daily.   cabotegravir & rilpivirine ER (CABENUVA) 600 & 900 MG/3ML injection Inject 1 kit into the muscle every 30 (thirty) days.   cabotegravir & rilpivirine ER (CABENUVA) 600 & 900 MG/3ML injection Inject 1 kit into the muscle every 2 (two) months.   carvedilol (COREG) 3.125 MG tablet Take 1 tablet (3.125 mg total) by mouth 2 (two) times daily with a meal.   clopidogrel (PLAVIX) 75 MG tablet Take 1 tablet by mouth once daily   empagliflozin (JARDIANCE) 25 MG TABS tablet Take 1 tablet (25 mg total) by mouth daily before breakfast.   fluticasone (FLONASE) 50 MCG/ACT nasal spray Place 1 spray into both nostrils daily.   HYDROcodone-acetaminophen (NORCO) 7.5-325 MG tablet Take 1 tablet by mouth every 6 (six) hours as needed for moderate pain.   hydrOXYzine (ATARAX) 10 MG tablet TAKE 1 TABLET BY MOUTH THREE TIMES DAILY AS NEEDED FOR ITCHING   insulin glargine (LANTUS SOLOSTAR) 100 UNIT/ML Solostar Pen Inject 32 Units into the skin daily at 10 pm.   loratadine (CLARITIN) 10 MG tablet Take 1 tablet (10 mg total) by mouth  daily.   losartan-hydrochlorothiazide (HYZAAR) 50-12.5 MG tablet Take 1 tablet by mouth daily.   metFORMIN (GLUCOPHAGE-XR) 500 MG 24 hr tablet Take 2 tablets (1,000 mg total) by mouth 2 (two) times daily with a meal.   Multiple Vitamins-Minerals (CENTRUM ADULTS PO) Take 1 tablet by mouth daily.   nitroGLYCERIN (NITROSTAT) 0.4 MG SL tablet PLACE 1 TABLET UNDER THE TONGUE EVERY 5 MINUTES AS NEEDED FOR CHEST PAIN   nystatin (NYSTATIN) powder Apply 1 application topically 2 (two) times daily.   omeprazole (PRILOSEC) 20 MG capsule TAKE 1 CAPSULE BY MOUTH AS NEEDED   tadalafil (CIALIS) 5 MG tablet Take 1 tablet (5 mg total) by mouth daily.   terazosin  (HYTRIN) 2 MG capsule Take 1 capsule by mouth at bedtime   [EXPIRED] cabotegravir & rilpivirine ER (CABENUVA) 600 & 900 MG/3ML injection 1 kit    No facility-administered encounter medications on file as of 01/30/2022.    Allergies (verified) Penicillins, Zoloft [sertraline hcl], Victoza [liraglutide], Chantix [varenicline], Lisinopril, Ozempic (0.25 or 0.5 mg-dose) [semaglutide(0.25 or 0.48m-dos)], and TTyler Aas[insulin degludec]   History: Past Medical History:  Diagnosis Date   Anxiety    Arthritis    "pretty much all over"   Asthma    "mild" (07/17/2014)   Atypical angina ([place]    Myoview 2003, Normal EF 64%   CAD (coronary artery disease)    a. 10/20 DES to 80% mid LAD   [place] rash of groin 11/03/2019   COPD (chronic obstructive pulmonary disease) ([place]    "mild" (07/17/2014)   Depression    Fatigue 06/22/2019   Foot pain 03/30/2011   GERD (gastroesophageal reflux disease)    H/O hiatal hernia    Headache    "weekly" (06/2014)   History of gout    HIV (human immunodeficiency virus infection) ([place] 1995   DX after shingles followed by Dr. FOla Spurr(ID)   Hx of cardiovascular stress test    ETT-Myoview (3/16):  Ex 10:15, No Ischemia, EF 57%; Normal Study   Hyperlipidemia    Hypertension    Well Controlled off meds   Increased anion gap metabolic acidosis 43/90/3009  Kidney stones    "passed them all"   Left lower quadrant abdominal pain 11/02/2019   Other and unspecified angina pectoris 12/18/2008   Overview:  Overview:  Greatly appreciate PCP f/u.   Last Assessment & Plan:  - Patient with episode of CP - 4 days, constant, substernal, not immediately resolved with NTG, felt better after burping and passing gas - Repeat EKG done here with no acute ST/T wave changes - CP now completely resolved. Encouraged patient to call 911 if recurs - To f/u with cardio in the next 2 weeks for further assess   Pneumonia 4-5 times   Rheumatoid arthritis(714.0)    Followed by Dr. TCharlestine Night  off MTX and prdnisone since 2007   Right hip pain 04/25/2019   Right shoulder pain 01/19/2018   Shingles (herpes zoster) polyneuropathy 11/08/2018   Sleep apnea    does not wear mask (07/17/2014)   Type II diabetes mellitus ([place]    Past Surgical History:  Procedure Laterality Date   [place]CTOMY  [place]   CORONARY ANGIOPLASTY WITH STENT PLACEMENT  07/17/2014   a. DES to 80% mid LAD   [place]/ UMBILICAL / VENTRAL HERNIA REPAIR  1990's   "it was a double; not inguinal"   LEFT HEART CATHETERIZATION  WITH CORONARY ANGIOGRAM N/A 07/17/2014   Procedure: LEFT HEART CATHETERIZATION WITH CORONARY ANGIOGRAM;  Surgeon: [name], MD;  Location[facility] C[facility] LAB;  Service: Cardiovascular;  Laterality: N/A;   PERCUTANEOUS STENT INTERVENTION  07/17/2014   Procedure: PERCUTANEOUS STENT INTERVENTION;  Surgeon: [name], MD;  Location[facility] C[facility] LAB;  Service: Cardiovascular;;   TONSILLECTOMY AND ADENOIDECTOMY  1990's   TRIGGER FINGER RELEASE Left 1990's   2nd & 5th digits   UMBILICAL HERNIA REPAIR  1980's   w/mesh   UVULOPALATOPHARYNGOPLASTY (UPPP)/TONSILLECTOMY/SEPTOPLASTY  40's   Family History  Problem Relation Age of Onset   Osteoporosis Mother    Diabetes Mother    Heart disease Father    Heart attack Father    Diabetes Father    COPD Father    Heart attack Brother    Stroke Maternal Grandmother    Stroke Maternal Grandfather    Stroke Paternal Grandmother    Stroke Paternal Grandfather    Social History   Socioeconomic History   Marital status: Single    Spouse name: Not on file   Number of children: Not on file   Years of education: 20   Highest education level: Not on file  Occupational History   Occupation: [employer]   Occupation: Administrator (retired)  Tobacco Use   Smoking status: Former    Packs/day: 1.00    Years: 46.00    Pack years: 46.00    Types: Cigarettes    Quit date: 12/27/2021    Years  since quitting: 0.0   Smokeless tobacco: Never  Vaping Use   Vaping Use: Never used  Substance and Sexual Activity   Alcohol use: Not Currently    Alcohol/week: 1.0 standard drink    Types: 1 Glasses of wine per week    Comment: 07/17/2014 "glass of wine once or twice/month"   Drug use: No   Sexual activity: Yes    Partners: Male    Comment: declined condoms 08/2019  Other Topics Concern   Not on file  Social History Narrative   [place] approved beginning 12/11/2009 - 12/27/2010   [employer] benefits approved; patient eligible for 100% discount for out patient labs and office visits. Patient eligible for 70% discount for other services per [employer] Elders 09/08/2010      Patient is on disability, and walks 2-3 times per week.    Social Determinants of Health   Financial Resource Strain: Not on file  Food Insecurity: Not on file  Transportation Needs: Not on file  Physical Activity: Not on file  Stress: Not on file  Social Connections: Not on file    Tobacco Counseling Counseling given: Not Answered   Clinical Intake:                 Diabetic?YES          Activities of Daily Living    01/20/2022   11:37 AM 12/05/2021   10:48 AM  In your present state of health, do you have any difficulty performing the following activities:  Hearing? 0 0  Vision? 0 0  Difficulty concentrating or making decisions? 0 0  Walking or climbing stairs? 0 0  Dressing or bathing? 0 0  Doing errands, shopping? 0 0    Patient Care Team: Aldine Contes, MD as PCP - General Johnnye Sima Doroteo Bradford, MD as PCP - Infectious Diseases (Infectious Diseases) Thelma Comp, OD as Consulting Physician (Optometry) Wilburt Finlay, OD as Referring Physician (Optometry)  Indicate any recent Medical  Services you may have received from other than [facility] providers in the past year (date may be approximate).     Assessment:   This is a routine wellness examination for  Riley Jones.  Hearing/Vision screen No results found.  Dietary issues and exercise activities discussed:     Goals Addressed   None   Depression Screen    01/20/2022   10:06 AM 12/05/2021   10:55 AM 12/05/2021    8:46 AM 11/25/2021    2:59 PM 10/01/2021    5:28 PM 07/01/2021    9:55 AM 05/30/2021    9:53 AM  PHQ 2/9 Scores  PHQ - 2 Score _0 0  PHQ- 9 Score _1 Fall Risk    01/20/2022   11:37 AM 12/05/2021   10:48 AM 12/05/2021    8:46 AM 11/25/2021    1:57 PM 10/01/2021    3:40 PM  Fall Risk   Falls in the past year? _2 0  Number falls in past yr: 1 1 0 1 0  Injury with Fall? 0 0 0 0 0  Risk for fall due to : Impaired balance/gait Impaired balance/gait  Impaired balance/gait No Fall Risks  Follow up Falls evaluation completed Falls evaluation completed;Falls prevention discussed  Falls evaluation completed;Falls prevention discussed Falls evaluation completed;Falls prevention discussed    FALL RISK PREVENTION PERTAINING TO THE HOME:  Any stairs in or around the home? Yes  If so, are there any without handrails? Yes  Home free of loose throw rugs in walkways, pet beds, electrical cords, etc? No  Adequate lighting in your home to reduce risk of falls? Yes   ASSISTIVE DEVICES UTILIZED TO PREVENT FALLS:  Life alert? No  Use of a cane, walker or w/c? No  Grab bars in the bathroom? No  Shower chair or bench in shower? No  Elevated toilet seat or a handicapped toilet? No   TIMED UP AND GO:  Was the test performed? No .  Length of time to ambulate 10 feet:  sec.     Cognitive Function:        Immunizations Immunization History  Administered Date(s) Administered   Fluad Quad(high Dose 65+) 06/09/2019, 05/27/2021   Hepatitis A 12/12/2007, 12/19/2008   Influenza Split 06/09/2012, 05/31/2014, 05/27/2021   Influenza Whole 06/07/2006, 07/19/2007, 06/27/2008, 07/24/2009, 06/26/2010, 06/09/2011   Influenza, High Dose Seasonal PF 05/10/2020    Influenza,inj,Quad PF,6+ Mos 05/22/2013, 04/29/2016, 05/06/2017, 07/04/2018   Influenza,inj,quad, With Preservative 05/07/2015   Influenza-Unspecified 05/30/2014, 05/07/2015, 11/13/2015   PFIZER(Purple Top)SARS-COV-2 Vaccination 10/21/2019, 11/11/2019, 05/24/2020, 07/05/2021   Pneumococcal Conjugate-13 02/25/2016   Pneumococcal Polysaccharide-23 06/07/2006, 06/29/2011, 08/08/2019   Td 12/04/2009   Tdap 06/14/2020   Zoster Recombinat (Shingrix) 05/05/2019, 12/15/2019   Zoster, Live 07/06/2014    TDAP status: Up to date  Flu Vaccine status: Up to date  Pneumococcal vaccine status: Up to date  Covid-19 vaccine status: Completed vaccines  Qualifies for Shingles Vaccine? Yes   Zostavax completed Yes   Shingrix Completed?: Yes  Screening Tests Health Maintenance  Topic Date Due   COVID-19 Vaccine (6 - Booster for Pfizer series) 08/30/2021   HEMOGLOBIN A1C  03/31/2022   INFLUENZA VACCINE  04/28/2022   LIPID PANEL  05/16/2022   OPHTHALMOLOGY EXAM  10/28/2022   FOOT EXAM  12/31/2022   COLONOSCOPY (Pts 45-40yr Insurance coverage will need to be confirmed)  11/17/2027   TETANUS/TDAP  06/14/2030  Pneumonia Vaccine 82+ Years old  Completed   Hepatitis C Screening  Completed   Zoster Vaccines- Shingrix  Completed   HPV VACCINES  Aged Out   COLON CANCER SCREENING ANNUAL FOBT  Discontinued    Health Maintenance  Health Maintenance Due  Topic Date Due   COVID-19 Vaccine (6 - Booster for [place] series) 08/30/2021    Colorectal cancer screening: Type of screening: Colonoscopy. Completed 11/16/2017. Repeat every 10 years  Lung Cancer Screening: (Low Dose CT Chest recommended if Age 18-80 years, 30 pack-year currently smoking OR have quit w/in 15years.) does qualify.   Lung Cancer Screening Referral: ( ORDER ALREADY PLACED BY PCP )   Additional Screening:  Hepatitis C Screening: does qualify; Completed 11/22/2006  Vision Screening: Recommended annual ophthalmology exams for  early detection of glaucoma and other disorders of the eye. Is the patient up to date with their annual eye exam?  Yes  Who is the provider or what is the name of the office in which the patient attends annual eye exams?   [facility]  If pt is not established with a provider, would they like to be referred to a provider to establish care? No .   Dental Screening: Recommended annual dental exams for proper oral hygiene  Community Resource Referral / Chronic Care Management: CRR required this visit?  No   CCM required this visit?  No      Plan:     I have personally reviewed and noted the following in the patient's chart:   Medical and social history Use of alcohol, tobacco or illicit drugs  Current medications and supplements including opioid prescriptions. Patient is currently taking opioid prescriptions. Information provided to patient regarding non-opioid alternatives. Patient advised to discuss non-opioid treatment plan with their provider. Functional ability and status Nutritional status Physical activity Advanced directives List of other physicians Hospitalizations, surgeries, and ER visits in previous 12 months Vitals Screenings to include cognitive, depression, and falls Referrals and appointments  In addition, I have reviewed and discussed with patient certain preventive protocols, quality metrics, and best practice recommendations. A written personalized care plan for preventive services as well as general preventive health recommendations were provided to patient.     Judyann Munson, CMA   01/30/2022   Nurse Notes: non face to face    Riley Jones , Thank you for taking time to come for your Medicare Wellness Visit. I appreciate your ongoing commitment to your health goals. Please review the following plan we discussed and let me know if I can assist you in the future.   These are the goals we discussed:  Goals      Blood Pressure < 140/90     HEMOGLOBIN  A1C < 7.0     LDL CALC < 100     Quit smoking / using tobacco     Reduce fat intake by changing to 1% milk        This is a list of the screening recommended for you and due dates:  Health Maintenance  Topic Date Due   COVID-19 Vaccine (6 - Booster for Pfizer series) 08/30/2021   Hemoglobin A1C  03/31/2022   Flu Shot  04/28/2022   Lipid (cholesterol) test  05/16/2022   Eye exam for diabetics  10/28/2022   Complete foot exam   12/31/2022   Colon Cancer Screening  11/17/2027   Tetanus Vaccine  06/14/2030   Pneumonia Vaccine  Completed   Hepatitis C Screening: USPSTF  Recommendation to screen - Ages 18-79 yo.  Completed   Zoster (Shingles) Vaccine  Completed   HPV Vaccine  Aged Out   Stool Blood Test  Discontinued

## 2022-01-30 NOTE — Patient Instructions (Addendum)
Please ask your pharmacy about receiving the Games developer. You are eligible to receive it despite completing your primary series with the original boosters.  ? ?Thanks! ? ?Estill Bamberg ?

## 2022-02-03 LAB — HIV-1 RNA QUANT-NO REFLEX-BLD
HIV 1 RNA Quant: <20 copies/mL — AB
HIV-1 RNA Quant, Log: <1.3 Log copies/mL — AB

## 2022-02-03 LAB — HEPATITIS A ANTIBODY, TOTAL: Hepatitis A AB,Total: REACTIVE — AB

## 2022-02-04 ENCOUNTER — Other Ambulatory Visit: Payer: Self-pay | Admitting: *Deleted

## 2022-02-04 DIAGNOSIS — I2510 Atherosclerotic heart disease of native coronary artery without angina pectoris: Secondary | ICD-10-CM

## 2022-02-04 NOTE — Telephone Encounter (Signed)
First rx went to [facility]. ?

## 2022-02-05 ENCOUNTER — Telehealth: Payer: Self-pay | Admitting: Dietician

## 2022-02-05 MED ORDER — NITROGLYCERIN 0.4 MG SL SUBL: SUBLINGUAL_TABLET | SUBLINGUAL | 3 refills | Status: AC

## 2022-02-05 NOTE — Telephone Encounter (Signed)
Needs to speak to [facility]. ?

## 2022-02-06 ENCOUNTER — Other Ambulatory Visit: Payer: Self-pay | Admitting: Internal Medicine

## 2022-02-06 ENCOUNTER — Other Ambulatory Visit (HOSPITAL_COMMUNITY): Payer: Self-pay

## 2022-02-06 DIAGNOSIS — N401 Enlarged prostate with lower urinary tract symptoms: Secondary | ICD-10-CM

## 2022-02-06 DIAGNOSIS — Z79891 Long term (current) use of opiate analgesic: Secondary | ICD-10-CM

## 2022-02-06 MED ORDER — TADALAFIL 5 MG PO TABS: 5.0000 mg | ORAL_TABLET | Freq: Every day | ORAL | 0 refills | Status: DC

## 2022-02-06 MED ORDER — HYDROCODONE-ACETAMINOPHEN 7.5-325 MG PO TABS
1.0000 | ORAL_TABLET | Freq: Four times a day (QID) | ORAL | 0 refills | Status: DC | PRN
Start: 2022-02-06 — End: 2022-03-10

## 2022-02-06 NOTE — Telephone Encounter (Signed)
Hydrocodone ?Last rx written  01/13/22. ?Last OV  5/5 with Dr Vinetta Bergamo; 4/25 with PCP. ?Next OV 5/23 with Dr Darrick Meigs. ?UDS  01/20/22. ?

## 2022-02-09 ENCOUNTER — Telehealth: Payer: Self-pay

## 2022-02-09 NOTE — Telephone Encounter (Signed)
Returned pt phone call. ? ?Pt not eligible for Low income Subsidy with medicare and will now qualify for BI cares assistance. Pt ok with me mailing application to his home. ?

## 2022-02-17 ENCOUNTER — Ambulatory Visit (INDEPENDENT_AMBULATORY_CARE_PROVIDER_SITE_OTHER): Payer: Medicare HMO | Admitting: Internal Medicine

## 2022-02-17 DIAGNOSIS — N401 Enlarged prostate with lower urinary tract symptoms: Secondary | ICD-10-CM | POA: Diagnosis not present

## 2022-02-17 DIAGNOSIS — E11618 Type 2 diabetes mellitus with other diabetic arthropathy: Secondary | ICD-10-CM

## 2022-02-17 DIAGNOSIS — J309 Allergic rhinitis, unspecified: Secondary | ICD-10-CM | POA: Diagnosis not present

## 2022-02-17 DIAGNOSIS — R351 Nocturia: Secondary | ICD-10-CM

## 2022-02-17 DIAGNOSIS — Z794 Long term (current) use of insulin: Secondary | ICD-10-CM

## 2022-02-17 MED ORDER — TADALAFIL 5 MG PO TABS: 5.0000 mg | ORAL_TABLET | Freq: Every day | ORAL | 0 refills | Status: DC

## 2022-02-17 MED ORDER — EMPAGLIFLOZIN 25 MG PO TABS: 25.0000 mg | ORAL_TABLET | Freq: Every day | ORAL | 1 refills | Status: DC

## 2022-02-17 MED ORDER — LANTUS SOLOSTAR 100 UNIT/ML ~~LOC~~ SOPN: 37.0000 [IU] | PEN_INJECTOR | Freq: Every day | SUBCUTANEOUS | 3 refills | Status: DC

## 2022-02-17 NOTE — Patient Instructions (Signed)
Mr.[name], it was a pleasure seeing you today!  Today we discussed:  DIABETES: -Increase Lantus to 37 units -Pick up Jardiance -Continue metformin -Follow up with Dr. Dareen Piano in June. Please bring your meter and your medications to that visit.     Please make sure to arrive 15 minutes prior to your next appointment. If you arrive late, you may be asked to reschedule.   We look forward to seeing you next time. Please call our clinic at [phone] if you have any questions or concerns. The best time to call is Monday-Friday from 9am-4pm, but there is someone available 24/7. If after hours or the weekend, call the main hospital number and ask for the Internal Medicine Resident On-Call. If you need medication refills, please notify your pharmacy one week in advance and they will send [place] a request.  Thank you for letting us take part in your care. Wishing you the best!

## 2022-02-18 ENCOUNTER — Encounter: Payer: Self-pay | Admitting: Internal Medicine

## 2022-02-18 ENCOUNTER — Telehealth: Payer: Self-pay

## 2022-02-18 NOTE — Telephone Encounter (Signed)
Submitted application for [employer] 06/999 to [place] [employer]) for patient assistance.   Phone: [phone]

## 2022-02-18 NOTE — Assessment & Plan Note (Signed)
He continues to experience symptoms. He has been taking zyrtec alone but also notes that he has flonase. He wasn't sure if he could use these two things together. Recommended a trial of using both and can revisit if no improvement at his next office visit.

## 2022-02-18 NOTE — Progress Notes (Addendum)
   Office Visit   Patient ID: MOHMMAD SALEEBY, male    DOB: 1956-09-04, 66 y.o.   MRN: [id number]   PCP: Aldine Contes, MD   Subjective:  CC: Diabetes and Allergies   DAYLIN GRUSZKA is a 66 y.o. year old male who presents for the above medical condition(s). Please refer to problem based charting for assessment and plan.    Objective:   BP 137/68 (BP Location: Right Arm, Patient Position: Sitting, Cuff Size: Normal)   Pulse 75   Temp 98.3 F (36.8 C) (Oral)   Wt 198 lb 12.8 oz (90.2 kg)   SpO2 97%   BMI 31.14 kg/m   Assessment & Plan:   Problem List Items Addressed This Visit       Respiratory   Chronic allergic rhinitis    He continues to experience symptoms. He has been taking zyrtec alone but also notes that he has flonase. He wasn't sure if he could use these two things together. Recommended a trial of using both and can revisit if no improvement at his next office visit.         Endocrine   Diabetes mellitus ([place])    Pt is presenting for medication management follow up for his poorly controlled DM. 80% of his readings had been in the very high range, 2% in target range, at his last office visit one month ago. Lantus was increased from 27 to 32U. He was also unable to afford synjardy at that time so it was split up into metformin and jardiance. He was continued on Ozempic '1mg'$  Since his LOV, he reports adherence to the increased dose of lantus and has been taking metformin. He notes that he was aware that jardiance was going to be sent to the pharmacy however he says it was never filled. He did not notify our office regarding this because he figured he could just bring it up at his next visit.  CGM download reviewed with him today. It does show some improvement, time in very high range 80>60%, High 36%, Target 2>4%. No lows.  --Recommend increase in lantus to 37U --Reiterated the importance of picking up the jardiance and starting it, in addition to continuing metformin and  ozempic --He will follow up in 4w for re-evaluation --He is reportedly working on some kind of patient assistance with [employer] for synjardy --He understands to notify our office if he develops hypoglycemia --CGM report will be scanned into the chart for future review and comparison      Relevant Medications   empagliflozin (JARDIANCE) 25 MG TABS tablet   insulin glargine (LANTUS SOLOSTAR) 100 UNIT/ML Solostar Pen       Return in about 3 weeks (around 03/10/2022) for Diabetes follow up.   Pt discussed with Dr. Forde Radon, MD Internal Medicine Resident PGY-3 [facility] 02/18/2022 9:23 AM

## 2022-02-18 NOTE — Assessment & Plan Note (Addendum)
Pt is presenting for medication management follow up for his poorly controlled DM. 80% of his readings had been in the very high range, 2% in target range, at his last office visit one month ago. Lantus was increased from 27 to 32U. He was also unable to afford synjardy at that time so it was split up into metformin and jardiance. He was continued on Ozempic '1mg'$  Since his LOV, he reports adherence to the increased dose of lantus and has been taking metformin. He notes that he was aware that jardiance was going to be sent to the pharmacy however he says it was never filled. He did not notify our office regarding this because he figured he could just bring it up at his next visit.  CGM download reviewed with him today. It does show some improvement, time in very high range 80>60%, High 36%, Target 2>4%. No lows.  --Recommend increase in lantus to 37U --Reiterated the importance of picking up the jardiance and starting it, in addition to continuing metformin and ozempic --He will follow up in 4w for re-evaluation --He is reportedly working on some kind of patient assistance with [employer] Gros for synjardy --He understands to notify our office if he develops hypoglycemia --CGM report will be scanned into the chart for future review and comparison

## 2022-02-22 ENCOUNTER — Other Ambulatory Visit: Payer: Self-pay | Admitting: Internal Medicine

## 2022-02-24 ENCOUNTER — Telehealth: Payer: Self-pay

## 2022-02-24 NOTE — Telephone Encounter (Signed)
Pt is requesting a call back it is about  co pay assist program

## 2022-02-25 NOTE — Telephone Encounter (Signed)
Returned pt's call. Following up on [facility] application.   Application corrected and refaxed to company. Pt aware

## 2022-02-26 NOTE — Progress Notes (Signed)
Internal Medicine Clinic Attending  Case discussed with the resident at the time of the visit.  We reviewed the resident's history and exam and pertinent patient test results.  I agree with the assessment, diagnosis, and plan of care documented in the resident's note.  

## 2022-03-02 DIAGNOSIS — E1165 Type 2 diabetes mellitus with hyperglycemia: Secondary | ICD-10-CM | POA: Diagnosis not present

## 2022-03-03 ENCOUNTER — Telehealth: Payer: Self-pay | Admitting: Dietician

## 2022-03-03 DIAGNOSIS — H43812 Vitreous degeneration, left eye: Secondary | ICD-10-CM | POA: Diagnosis not present

## 2022-03-03 DIAGNOSIS — E113211 Type 2 diabetes mellitus with mild nonproliferative diabetic retinopathy with macular edema, right eye: Secondary | ICD-10-CM | POA: Diagnosis not present

## 2022-03-03 DIAGNOSIS — H33322 Round hole, left eye: Secondary | ICD-10-CM | POA: Diagnosis not present

## 2022-03-03 DIAGNOSIS — H31092 Other chorioretinal scars, left eye: Secondary | ICD-10-CM | POA: Diagnosis not present

## 2022-03-03 DIAGNOSIS — H25813 Combined forms of age-related cataract, bilateral: Secondary | ICD-10-CM | POA: Diagnosis not present

## 2022-03-03 NOTE — Telephone Encounter (Signed)
Call to follow up on diabetes control: He was supposed to follow up in 1 month, but has appointment with Dr. Dareen Piano July 11.   Riley Jones says he is still waiting on Synjardy from patient assistance. (Is in the doughnut hole) However, since he restarted the Ozempic-($160/month);  his CGM shows he is mostly in the "green zone" (70-180). He states that the Ozempic causes him to have no appetite, nothing tastes good.  He states he can live with the food not tasting the same  In addition to the [place] he takes Jaridance, metformin, lantus 37 units He also says he is no longer smoking x66 days. .  Eye doctor  report from today: left eye- no more holes  Right eye- swollen  and vision is reduced, has to get injections. Dr. Armanda Heritage   Congratulated Riley Jones on quitting smoking and self managing hs diabetes well. Encouraged Riley Jones to call anytime he has questions or concerns about his diabetes/ healthcare.

## 2022-03-05 NOTE — Telephone Encounter (Signed)
Received notification from [place] [employer]) regarding patient assistance DENIAL for [employer] XR 10/'1000MG'$ .   Patient exceeds program income limits  Phone: [phone]

## 2022-03-10 ENCOUNTER — Encounter: Payer: Self-pay | Admitting: Infectious Diseases

## 2022-03-10 ENCOUNTER — Other Ambulatory Visit: Payer: Self-pay | Admitting: Internal Medicine

## 2022-03-10 ENCOUNTER — Other Ambulatory Visit: Payer: Self-pay | Admitting: Pharmacist

## 2022-03-10 DIAGNOSIS — Z794 Long term (current) use of insulin: Secondary | ICD-10-CM

## 2022-03-10 DIAGNOSIS — Z79891 Long term (current) use of opiate analgesic: Secondary | ICD-10-CM

## 2022-03-10 DIAGNOSIS — B20 Human immunodeficiency virus [HIV] disease: Secondary | ICD-10-CM

## 2022-03-10 MED ORDER — BIKTARVY 50-200-25 MG PO TABS: 1.0000 | ORAL_TABLET | Freq: Every day | ORAL | 11 refills | Status: DC

## 2022-03-10 MED ORDER — HYDROCODONE-ACETAMINOPHEN 7.5-325 MG PO TABS: 1.0000 | ORAL_TABLET | Freq: Four times a day (QID) | ORAL | 0 refills | Status: DC | PRN

## 2022-03-10 MED ORDER — SEMAGLUTIDE (1 MG/DOSE) 4 MG/3ML ~~LOC~~ SOPN: 1.0000 mg | PEN_INJECTOR | SUBCUTANEOUS | 1 refills | Status: DC

## 2022-03-10 NOTE — Telephone Encounter (Signed)
Rx sent 

## 2022-03-10 NOTE — Telephone Encounter (Signed)
Last rx written  02/06/22. Last OV 02/17/22. Next OV  04/07/22. UDS  01/20/22.

## 2022-03-10 NOTE — Telephone Encounter (Signed)
Patient informed to start [place] on July 5th as he recently received his [place] on May 5th. Will cancel my [place] appt with him in July; he will still see Dr. Juleen China in September for normal B20 follow-up while on Biktarvy. Thanks!

## 2022-03-10 NOTE — Telephone Encounter (Signed)
Also requesting a refill on Ozempic '8mg'$ /20m inject '2mg'$  into the skin once a week; not on current med list.

## 2022-03-11 NOTE — Telephone Encounter (Signed)
Spoke with pt  Patient aware and will continue to take metformin and jardiance in the meantime.

## 2022-03-18 ENCOUNTER — Encounter: Payer: Self-pay | Admitting: Infectious Diseases

## 2022-03-24 DIAGNOSIS — H33322 Round hole, left eye: Secondary | ICD-10-CM | POA: Diagnosis not present

## 2022-03-24 DIAGNOSIS — H3582 Retinal ischemia: Secondary | ICD-10-CM | POA: Diagnosis not present

## 2022-03-24 DIAGNOSIS — H31092 Other chorioretinal scars, left eye: Secondary | ICD-10-CM | POA: Diagnosis not present

## 2022-03-24 DIAGNOSIS — E113211 Type 2 diabetes mellitus with mild nonproliferative diabetic retinopathy with macular edema, right eye: Secondary | ICD-10-CM | POA: Diagnosis not present

## 2022-03-24 DIAGNOSIS — H35363 Drusen (degenerative) of macula, bilateral: Secondary | ICD-10-CM | POA: Diagnosis not present

## 2022-03-24 DIAGNOSIS — H25813 Combined forms of age-related cataract, bilateral: Secondary | ICD-10-CM | POA: Diagnosis not present

## 2022-04-01 ENCOUNTER — Other Ambulatory Visit: Payer: Self-pay | Admitting: Internal Medicine

## 2022-04-01 DIAGNOSIS — Z79891 Long term (current) use of opiate analgesic: Secondary | ICD-10-CM

## 2022-04-02 ENCOUNTER — Encounter: Payer: Medicare HMO | Admitting: Pharmacist

## 2022-04-02 MED ORDER — HYDROCODONE-ACETAMINOPHEN 7.5-325 MG PO TABS: 1.0000 | ORAL_TABLET | Freq: Four times a day (QID) | ORAL | 0 refills | Status: DC | PRN

## 2022-04-06 NOTE — Progress Notes (Signed)
[facility])                                            [place] Team                                        Statin Quality Measure Assessment    04/06/2022  TARO HIDROGO [birth year] [id number]  Per review of chart and payor information, this patient has been flagged for non-adherence to the following CMS Quality Measure:   '[x]'$  Statin Use in Persons with Diabetes  '[x]'$  Statin Use in Persons with Cardiovascular Disease  The ASCVD Risk score (Arnett DK, et al., 2019) failed to calculate for the following reasons:   The valid total cholesterol range is 130 to 320 mg/dL  This patient is failing both measures as a result of not filling atorvastatin, per [place], in 2023. Last LDL 60 mg/dL (as of 04/2021) and almost due for an updated lipid panel. Atorvastatin 40 mg daily was prescribed October of last year with no refills. If clinically appropriate, please consider assessing statin therapy at the next scheduled appointment on 04/07/2022.    Please consider ONE of the following recommendations:   Initiate high intensity statin Atorvastatin '40mg'$  once daily, #90, 3 refills   Rosuvastatin '20mg'$  once daily, #90, 3 refills    Initiate moderate intensity          statin with reduced frequency if prior          statin intolerance 1x weekly, #13, 3 refills   2x weekly, #26, 3 refills   3x weekly, #39, 3 refills   Code for past statin intolerance or other exclusions (required annually)  Drug Induced Myopathy G72.0   Myositis, unspecified M60.9   Rhabdomyolysis M62.82   Cirrhosis of liver K74.69   Biliary cirrhosis, unspecified K74.5   Abnormal blood glucose - for SUPD ONLY R73.09   Prediabetes - for SUPD ONLY  R73.03   Polycystic ovarian syndrome E28.2   Adverse effect of antihyperlipidemic and antiarteriosclerotic drugs, initial encounter H37.1I9C   Thank you for your time,  Kristeen Miss, [place] Cell: [phone]

## 2022-04-07 ENCOUNTER — Encounter: Payer: Self-pay | Admitting: Internal Medicine

## 2022-04-07 ENCOUNTER — Ambulatory Visit (INDEPENDENT_AMBULATORY_CARE_PROVIDER_SITE_OTHER): Payer: Medicare HMO | Admitting: Internal Medicine

## 2022-04-07 VITALS — BP 138/75 | HR 90 | Ht 70.0 in | Wt 204.4 lb

## 2022-04-07 DIAGNOSIS — B20 Human immunodeficiency virus [HIV] disease: Secondary | ICD-10-CM | POA: Diagnosis not present

## 2022-04-07 DIAGNOSIS — E11618 Type 2 diabetes mellitus with other diabetic arthropathy: Secondary | ICD-10-CM | POA: Diagnosis not present

## 2022-04-07 DIAGNOSIS — I10 Essential (primary) hypertension: Secondary | ICD-10-CM | POA: Diagnosis not present

## 2022-04-07 DIAGNOSIS — Z794 Long term (current) use of insulin: Secondary | ICD-10-CM | POA: Diagnosis not present

## 2022-04-07 DIAGNOSIS — Z79891 Long term (current) use of opiate analgesic: Secondary | ICD-10-CM | POA: Diagnosis not present

## 2022-04-07 DIAGNOSIS — E7800 Pure hypercholesterolemia, unspecified: Secondary | ICD-10-CM | POA: Diagnosis not present

## 2022-04-07 DIAGNOSIS — N401 Enlarged prostate with lower urinary tract symptoms: Secondary | ICD-10-CM

## 2022-04-07 DIAGNOSIS — F17211 Nicotine dependence, cigarettes, in remission: Secondary | ICD-10-CM

## 2022-04-07 DIAGNOSIS — R351 Nocturia: Secondary | ICD-10-CM

## 2022-04-07 DIAGNOSIS — N5201 Erectile dysfunction due to arterial insufficiency: Secondary | ICD-10-CM | POA: Diagnosis not present

## 2022-04-07 DIAGNOSIS — F32A Depression, unspecified: Secondary | ICD-10-CM

## 2022-04-07 DIAGNOSIS — M069 Rheumatoid arthritis, unspecified: Secondary | ICD-10-CM | POA: Diagnosis not present

## 2022-04-07 DIAGNOSIS — Z720 Tobacco use: Secondary | ICD-10-CM

## 2022-04-07 DIAGNOSIS — Z0000 Encounter for general adult medical examination without abnormal findings: Secondary | ICD-10-CM

## 2022-04-07 DIAGNOSIS — J42 Unspecified chronic bronchitis: Secondary | ICD-10-CM

## 2022-04-07 LAB — POCT GLYCOSYLATED HEMOGLOBIN (HGB A1C): Hemoglobin A1C: 8.7 % — AB (ref 4.0–5.6)

## 2022-04-07 LAB — GLUCOSE, CAPILLARY: Glucose-Capillary: 139 mg/dL — ABNORMAL HIGH (ref 70–99)

## 2022-04-07 MED ORDER — SEMAGLUTIDE (2 MG/DOSE) 8 MG/3ML ~~LOC~~ SOPN: 2.0000 mg | PEN_INJECTOR | SUBCUTANEOUS | 3 refills | Status: DC

## 2022-04-07 MED ORDER — LANTUS SOLOSTAR 100 UNIT/ML ~~LOC~~ SOPN: 40.0000 [IU] | PEN_INJECTOR | Freq: Every day | SUBCUTANEOUS | 3 refills | Status: DC

## 2022-04-07 MED ORDER — ATORVASTATIN CALCIUM 40 MG PO TABS: 40.0000 mg | ORAL_TABLET | Freq: Every day | ORAL | 0 refills | Status: DC

## 2022-04-07 MED ORDER — ATORVASTATIN CALCIUM 40 MG PO TABS: 40.0000 mg | ORAL_TABLET | Freq: Every day | ORAL | 3 refills | Status: DC

## 2022-04-07 NOTE — Assessment & Plan Note (Signed)
-   This problem is chronic and stable -Patient does have chronic pain secondary to his RA  -His pain is well controlled on his current regimen and the benefits of continuing this medication currently outweigh the risks and allows him to do his daily activities as well as his job -We will check UDS today -No further work-up at this time

## 2022-04-07 NOTE — Patient Instructions (Addendum)
Congratulations on stopping smoking!  Diabetes Mellitus Increase Lantus to 40 units.   Increase Ozempic to '2mg'$ , please call the office if you have any intolerable side effects.   Continue taking the Metformin and Jardiance as prescribed.   High Cholesterol Restart Lipitor (Atorvastatin) '40mg'$ , sent to [facility].   High Blood Pressure Continue to monitor blood pressure at home/work. Continue to take Hyzaar and Coreg as prescribed.   Erectile Dysfunction Referral to Urology, they will call you to set up an appointment.

## 2022-04-07 NOTE — Assessment & Plan Note (Signed)
-   This problem is chronic and stable -Patient is on albuterol as needed only and only needs to use this occasionally -He quit smoking in April which has helped him overall -We will recheck PFTs after next visit -No further work-up at this time

## 2022-04-07 NOTE — Assessment & Plan Note (Signed)
Patient reports having stopped all tobacco use on 12/27/2021. He reports feeling well with no significant cravings.

## 2022-04-07 NOTE — Assessment & Plan Note (Signed)
-   This problem is chronic and stable -Patient's PHQ 9 score was 1 today -He is not on any medications currently -No further work-up at this time

## 2022-04-07 NOTE — Assessment & Plan Note (Addendum)
Patient reports improvement in frequency of waking up at night to urinate with tadalafil '5mg'$  daily regimen. Plan to continue tadalafil for urinary symptoms.

## 2022-04-07 NOTE — Assessment & Plan Note (Signed)
Patient reports still experiencing erectile dysfunction despite tadalafil '5mg'$  daily. Plan to continue tadalafil for treatment of BPH urinary symptoms but will refer to urology for further evaluation for ED.

## 2022-04-07 NOTE — Progress Notes (Signed)
Subjective:   Patient ID: Riley Jones male   DOB: [birth year] 66 y.o.   MRN: [id number]  HPI: Riley Jones is a 66 y.o. 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   He reports that he generally feels well and his diabetes is managed with a regimen of Lantus 37U, Ozempic '1mg'$ , Metformin '1000mg'$  BID and Jardiance '25mg'$  and says he takes his medications as prescribed. He just started the Jardiance after his most recent visit two months ago and says he is able to fill the prescription. He brought his [place] blood sugar log today. He denies any lows and denies episodes of dizziness, weakness or fatigue. He has regular follow-up with ophthalmology (Dr. Jalene Mullet). He does not report any bothersome side effects.  Patient reports that he has been taking his blood pressures at work and they rarely exceed 130s/80s. He feels well on his current regimen of Coreg and Hyzaar.   He has an acute complaint of erectile dysfunction today. He states that he has been taking tadalafil '5mg'$  daily and notices improvement in nighttime wakening associated with BPH. However he reports that it does not improve the erectile dysfunction and he is interested in further evaluation/treatment for it.   Patient Active Problem List   Diagnosis Date Noted   BPH (benign prostatic hyperplasia) 01/20/2022   Upper respiratory infection 12/30/2021   Lung nodule 10/17/2020   Psoriasis 11/02/2019   Abdominal hernia 03/22/2019   GERD (gastroesophageal reflux disease) 12/01/2016   Chronic allergic rhinitis 08/25/2016   Drug-induced skin rash 09/16/2015   Chronic prescription opiate use 11/22/2014   CAD (coronary artery disease) 09/05/2014   Tobacco abuse 07/05/2014   Diarrhea 03/09/2014   Anxiety 12/15/2012   Preventative health care 12/15/2012   COPD (chronic obstructive pulmonary  disease) ([place]) 12/04/2012   Erectile dysfunction 10/21/2011   Insomnia 08/03/2011   Rheumatoid arthritis ([place]) 02/03/2011   Hypercholesterolemia 10/11/2006   Human immunodeficiency virus (HIV) disease ([place]) 07/08/2006   Diabetes mellitus ([place]) 07/08/2006   Depression 07/08/2006   Carpal tunnel syndrome 07/08/2006   Essential hypertension 07/08/2006     Current Outpatient Medications  Medication Sig Dispense Refill   Semaglutide, 2 MG/DOSE, 8 MG/3ML SOPN Inject 2 mg into the skin once a week. 3 mL 3   albuterol (VENTOLIN HFA) 108 (90 Base) MCG/ACT inhaler Inhale 2 puffs into the lungs every 6 (six) hours as needed for wheezing or shortness of breath. 9 g 3   aspirin 81 MG EC tablet Take 81 mg by mouth daily.       atorvastatin (LIPITOR) 40 MG tablet Take 1 tablet (40 mg total) by mouth daily. 90 tablet 3   BD PEN NEEDLE NANO 2ND GEN 32G X 4 MM MISC USE UP TO 8 PER WEEK 100 each 1   betamethasone dipropionate 0.05 % cream Apply topically 2 (two) times daily. 30 g 0   bictegravir-emtricitabine-tenofovir AF (BIKTARVY) 50-200-25 MG TABS tablet Take 1 tablet by mouth daily. 30 tablet 11   carvedilol (COREG) 3.125 MG tablet Take 1 tablet (3.125 mg total) by mouth 2 (two) times daily with a meal. 180 tablet 1   clopidogrel (PLAVIX) 75 MG tablet Take 1 tablet by mouth once daily 90 tablet 2   empagliflozin (JARDIANCE) 25 MG TABS tablet Take 1 tablet (25 mg total) by mouth daily before breakfast. 90 tablet  1   fluticasone (FLONASE) 50 MCG/ACT nasal spray Place 1 spray into both nostrils daily. 16 g 0   HYDROcodone-acetaminophen (NORCO) 7.5-325 MG tablet Take 1 tablet by mouth every 6 (six) hours as needed for moderate pain. 60 tablet 0   hydrOXYzine (ATARAX) 10 MG tablet TAKE 1 TABLET BY MOUTH THREE TIMES DAILY AS NEEDED FOR ITCHING 30 tablet 1   insulin glargine (LANTUS SOLOSTAR) 100 UNIT/ML Solostar Pen Inject 40 Units into the skin daily at 10 pm. 15 mL 3   loratadine (CLARITIN) 10 MG tablet  Take 1 tablet (10 mg total) by mouth daily. 90 tablet 3   losartan-hydrochlorothiazide (HYZAAR) 50-12.5 MG tablet Take 1 tablet by mouth daily. 90 tablet 1   metFORMIN (GLUCOPHAGE-XR) 500 MG 24 hr tablet Take 2 tablets (1,000 mg total) by mouth 2 (two) times daily with a meal. 180 tablet 1   Multiple Vitamins-Minerals (CENTRUM ADULTS PO) Take 1 tablet by mouth daily.     nitroGLYCERIN (NITROSTAT) 0.4 MG SL tablet PLACE 1 TABLET UNDER THE TONGUE EVERY 5 MINUTES AS NEEDED FOR CHEST PAIN 25 tablet 3   nystatin (NYSTATIN) powder Apply 1 application topically 2 (two) times daily. 45 g 1   omeprazole (PRILOSEC) 20 MG capsule TAKE 1 CAPSULE BY MOUTH AS NEEDED 90 capsule 3   tadalafil (CIALIS) 5 MG tablet Take 1 tablet (5 mg total) by mouth daily. 30 tablet 0   terazosin (HYTRIN) 2 MG capsule Take 1 capsule by mouth at bedtime 90 capsule 3   No current facility-administered medications for this visit.     Review of Systems: Pertinent items are noted in HPI.  Objective:   Physical Exam: Vitals:   04/07/22 0830  BP: 138/75  Pulse: 90  SpO2: 99%  Weight: 204 lb 6.4 oz (92.7 kg)  Height: '5\' 10"'$  (1.778 m)   Physical Exam Vitals reviewed.  Constitutional:      General: He is not in acute distress.    Appearance: Normal appearance.  HENT:     Head: Normocephalic and atraumatic.  Cardiovascular:     Rate and Rhythm: Normal rate and regular rhythm.     Heart sounds: No murmur heard.    No friction rub. No gallop.  Pulmonary:     Effort: Pulmonary effort is normal. No respiratory distress.     Breath sounds: Normal breath sounds. No wheezing.  Abdominal:     General: Abdomen is flat. There is no distension.     Palpations: Abdomen is soft.     Tenderness: There is no abdominal tenderness.  Musculoskeletal:        General: No swelling.  Skin:    General: Skin is warm and dry.  Neurological:     Mental Status: He is alert and oriented to person, place, and time.      Assessment &  Plan:   Essential hypertension BP is well-controlled on a regimen of Coreg 3.125 and Hyzaar 50-12.'5mg'$ . BP in office today was 138/75, and patient reports that it does not exceed 130s/80s when he checks at work. Patient has also recently stopped smoking as of 12/27/2021. Plan is to continue medication regimen with no changes, given good home BP readings.    Diabetes mellitus ([place]) Patient has chronic uncontrolled T2DM managed with Lantus 37U, Metformin '1000mg'$  BID, Ozempic '1mg'$ . Patient reports he also started taking Jardiance '25mg'$  since last month's visit and reports good adherence to regimen. Today's a1C is 8.7 (previously 9.2). Home blood sugar readings average 172 mg/dL  with 61% in target range, 33% high range, and 6% very high range. No lows. Patient has regular ophthalmology follow-up with Dr. Jalene Mullet.   Plan: -Check urine microalbumin/cr today -Increase Lantus to 40U -Increase Ozempic to '2mg'$ , patient advised to call office if experiencing significant side effects   Preventative health care Patient has low-dose chest CT scheduled on 04/17/2022.   BPH (benign prostatic hyperplasia) Patient reports improvement in frequency of waking up at night to urinate with tadalafil '5mg'$  daily regimen. Plan to continue tadalafil for urinary symptoms.   Erectile dysfunction Patient reports still experiencing erectile dysfunction despite tadalafil '5mg'$  daily. Plan to continue tadalafil for treatment of BPH urinary symptoms but will refer to urology for further evaluation for ED.   Tobacco abuse Patient reports having stopped all tobacco use on 12/27/2021. He reports feeling well with no significant cravings.

## 2022-04-07 NOTE — Assessment & Plan Note (Signed)
Patient has low-dose chest CT scheduled on 04/17/2022.

## 2022-04-07 NOTE — Assessment & Plan Note (Signed)
-   This problem is chronic and stable -Patient follows up with Dr. Johnnye Sima for this -He was recently switched back to [facility] from [facility] secondary to intolerance to this medication (rash and pain) -Patient to follow-up with ID in September -No further work-up at this time

## 2022-04-07 NOTE — Progress Notes (Signed)
Attestation for Student Documentation:  I personally was present and performed or re-performed the history, physical exam and medical decision-making activities of this service and have verified that the service and findings are accurately documented in the student's note.  Aldine Contes, MD 04/07/2022, 3:00 PM

## 2022-04-07 NOTE — Assessment & Plan Note (Signed)
Patient has chronic uncontrolled T2DM managed with Lantus 37U, Metformin '1000mg'$  BID, Ozempic '1mg'$ . Patient reports he also started taking Jardiance '25mg'$  since last month's visit and reports good adherence to regimen. Today's a1C is 8.7 (previously 9.2). Home blood sugar readings average 172 mg/dL with 61% in target range, 33% high range, and 6% very high range. No lows. Patient has regular ophthalmology follow-up with Dr. Jalene Mullet.   Plan: -Check urine microalbumin/cr today -Increase Lantus to 40U -Increase Ozempic to '2mg'$ , patient advised to call office if experiencing significant side effects

## 2022-04-07 NOTE — Assessment & Plan Note (Signed)
BP is well-controlled on a regimen of Coreg 3.125 and Hyzaar 50-12.'5mg'$ . BP in office today was 138/75, and patient reports that it does not exceed 130s/80s when he checks at work. Patient has also recently stopped smoking as of 12/27/2021. Plan is to continue medication regimen with no changes, given good home BP readings.

## 2022-04-08 LAB — MICROALBUMIN / CREATININE URINE RATIO
Creatinine, Urine: 56.9 mg/dL
Microalb/Creat Ratio: 12 mg/g creat (ref 0–29)
Microalbumin, Urine: 6.8 ug/mL

## 2022-04-09 ENCOUNTER — Other Ambulatory Visit: Payer: Self-pay | Admitting: Internal Medicine

## 2022-04-09 DIAGNOSIS — E11618 Type 2 diabetes mellitus with other diabetic arthropathy: Secondary | ICD-10-CM

## 2022-04-09 DIAGNOSIS — Z794 Long term (current) use of insulin: Secondary | ICD-10-CM

## 2022-04-10 LAB — TOXASSURE SELECT,+ANTIDEPR,UR

## 2022-04-16 ENCOUNTER — Other Ambulatory Visit: Payer: Self-pay | Admitting: Internal Medicine

## 2022-04-16 DIAGNOSIS — E11618 Type 2 diabetes mellitus with other diabetic arthropathy: Secondary | ICD-10-CM

## 2022-04-16 NOTE — Telephone Encounter (Signed)
Call to Pharmacy-patient has available refill.

## 2022-04-17 ENCOUNTER — Ambulatory Visit (HOSPITAL_COMMUNITY)
Admission: RE | Admit: 2022-04-17 | Discharge: 2022-04-17 | Disposition: A | Discharge status: Home / Self Care | Payer: Medicare HMO | Source: Ambulatory Visit | Attending: Internal Medicine | Admitting: Internal Medicine

## 2022-04-17 DIAGNOSIS — I700 Atherosclerosis of aorta: Secondary | ICD-10-CM | POA: Diagnosis not present

## 2022-04-17 DIAGNOSIS — F17210 Nicotine dependence, cigarettes, uncomplicated: Secondary | ICD-10-CM | POA: Diagnosis not present

## 2022-04-17 DIAGNOSIS — I2510 Atherosclerotic heart disease of native coronary artery without angina pectoris: Secondary | ICD-10-CM | POA: Insufficient documentation

## 2022-04-17 DIAGNOSIS — Z87891 Personal history of nicotine dependence: Secondary | ICD-10-CM | POA: Diagnosis not present

## 2022-04-17 DIAGNOSIS — Z0000 Encounter for general adult medical examination without abnormal findings: Secondary | ICD-10-CM

## 2022-04-17 DIAGNOSIS — J439 Emphysema, unspecified: Secondary | ICD-10-CM | POA: Diagnosis not present

## 2022-04-17 DIAGNOSIS — Z122 Encounter for screening for malignant neoplasm of respiratory organs: Secondary | ICD-10-CM | POA: Diagnosis not present

## 2022-04-17 DIAGNOSIS — Z720 Tobacco use: Secondary | ICD-10-CM

## 2022-04-20 ENCOUNTER — Other Ambulatory Visit: Payer: Self-pay

## 2022-04-20 ENCOUNTER — Telehealth: Payer: Self-pay | Admitting: Internal Medicine

## 2022-04-20 DIAGNOSIS — I10 Essential (primary) hypertension: Secondary | ICD-10-CM

## 2022-04-20 MED ORDER — TERAZOSIN HCL 2 MG PO CAPS: 2.0000 mg | ORAL_CAPSULE | Freq: Every day | ORAL | 3 refills | Status: DC

## 2022-04-20 NOTE — Telephone Encounter (Signed)
I called the patient to discuss the results of his annual lung cancer screening CT scan.  No evidence of malignancy noted.  Patient will need to continue screening every 12 months.  No further work-up at this time.  Patient expressed understanding and is in agreement with plan.

## 2022-04-23 ENCOUNTER — Other Ambulatory Visit: Payer: Self-pay

## 2022-04-23 MED ORDER — CLOPIDOGREL BISULFATE 75 MG PO TABS: 75.0000 mg | ORAL_TABLET | Freq: Every day | ORAL | 0 refills | Status: DC

## 2022-04-23 NOTE — Telephone Encounter (Signed)
Pt's medication was sent to pt's pharmacy as requested. Confirmation received.  °

## 2022-04-29 ENCOUNTER — Other Ambulatory Visit: Payer: Self-pay

## 2022-04-29 DIAGNOSIS — K219 Gastro-esophageal reflux disease without esophagitis: Secondary | ICD-10-CM

## 2022-04-29 MED ORDER — OMEPRAZOLE 20 MG PO CPDR: DELAYED_RELEASE_CAPSULE | ORAL | 3 refills | Status: DC

## 2022-05-10 ENCOUNTER — Other Ambulatory Visit: Payer: Self-pay | Admitting: Internal Medicine

## 2022-05-10 DIAGNOSIS — N401 Enlarged prostate with lower urinary tract symptoms: Secondary | ICD-10-CM

## 2022-05-10 DIAGNOSIS — Z79891 Long term (current) use of opiate analgesic: Secondary | ICD-10-CM

## 2022-05-11 MED ORDER — HYDROCODONE-ACETAMINOPHEN 7.5-325 MG PO TABS
1.0000 | ORAL_TABLET | Freq: Four times a day (QID) | ORAL | 0 refills | Status: DC | PRN
Start: 2022-05-11 — End: 2022-05-26

## 2022-05-11 MED ORDER — TADALAFIL 5 MG PO TABS: 5.0000 mg | ORAL_TABLET | Freq: Every day | ORAL | 0 refills | Status: DC

## 2022-05-13 ENCOUNTER — Other Ambulatory Visit: Payer: Self-pay | Admitting: Cardiovascular Disease

## 2022-05-21 ENCOUNTER — Other Ambulatory Visit: Payer: Self-pay

## 2022-05-21 MED ORDER — CLOPIDOGREL BISULFATE 75 MG PO TABS: 75.0000 mg | ORAL_TABLET | Freq: Every day | ORAL | 0 refills | Status: DC

## 2022-05-21 NOTE — Patient Outreach (Signed)
[facility]) Care Management  05/21/2022  CAP MASSI 05-12-1956 [id number]   Telephone call to patient for nurse call.  No answer. Unable to leave a message.  Plan: RN CM will attempt again within 4 business days and send letter.  [name], RN, MSN [facility] Care Management Care Management Coordinator Direct Line [phone] Toll Free: [phone]  Fax: [phone]

## 2022-05-23 ENCOUNTER — Other Ambulatory Visit: Payer: Self-pay | Admitting: Internal Medicine

## 2022-05-23 DIAGNOSIS — E11618 Type 2 diabetes mellitus with other diabetic arthropathy: Secondary | ICD-10-CM

## 2022-05-26 ENCOUNTER — Other Ambulatory Visit: Payer: Medicare HMO

## 2022-05-26 ENCOUNTER — Encounter: Payer: Self-pay | Admitting: Physician Assistant

## 2022-05-26 ENCOUNTER — Other Ambulatory Visit: Payer: Self-pay

## 2022-05-26 ENCOUNTER — Ambulatory Visit: Payer: Medicare HMO | Admitting: Physician Assistant

## 2022-05-26 ENCOUNTER — Other Ambulatory Visit: Payer: Self-pay | Admitting: Internal Medicine

## 2022-05-26 ENCOUNTER — Other Ambulatory Visit
Admission: RE | Admit: 2022-05-26 | Discharge: 2022-05-26 | Disposition: A | Discharge status: Home / Self Care | Payer: Medicare HMO | Source: Ambulatory Visit | Attending: Internal Medicine | Admitting: Internal Medicine

## 2022-05-26 ENCOUNTER — Other Ambulatory Visit: Payer: Self-pay | Admitting: *Deleted

## 2022-05-26 VITALS — BP 140/70 | HR 80 | Ht 70.0 in | Wt 205.4 lb

## 2022-05-26 DIAGNOSIS — Z79891 Long term (current) use of opiate analgesic: Secondary | ICD-10-CM

## 2022-05-26 DIAGNOSIS — Z113 Encounter for screening for infections with a predominantly sexual mode of transmission: Secondary | ICD-10-CM | POA: Insufficient documentation

## 2022-05-26 DIAGNOSIS — I2510 Atherosclerotic heart disease of native coronary artery without angina pectoris: Secondary | ICD-10-CM | POA: Insufficient documentation

## 2022-05-26 DIAGNOSIS — Z79899 Other long term (current) drug therapy: Secondary | ICD-10-CM

## 2022-05-26 DIAGNOSIS — I10 Essential (primary) hypertension: Secondary | ICD-10-CM | POA: Insufficient documentation

## 2022-05-26 DIAGNOSIS — F17200 Nicotine dependence, unspecified, uncomplicated: Secondary | ICD-10-CM | POA: Diagnosis not present

## 2022-05-26 DIAGNOSIS — B20 Human immunodeficiency virus [HIV] disease: Secondary | ICD-10-CM

## 2022-05-26 DIAGNOSIS — R5383 Other fatigue: Secondary | ICD-10-CM | POA: Diagnosis not present

## 2022-05-26 DIAGNOSIS — E118 Type 2 diabetes mellitus with unspecified complications: Secondary | ICD-10-CM | POA: Diagnosis not present

## 2022-05-26 DIAGNOSIS — E785 Hyperlipidemia, unspecified: Secondary | ICD-10-CM | POA: Diagnosis not present

## 2022-05-26 MED ORDER — CLOPIDOGREL BISULFATE 75 MG PO TABS: 75.0000 mg | ORAL_TABLET | Freq: Every day | ORAL | 3 refills | Status: DC

## 2022-05-26 MED ORDER — LOSARTAN POTASSIUM-HCTZ 50-12.5 MG PO TABS: 1.0000 | ORAL_TABLET | Freq: Every day | ORAL | 1 refills | Status: DC

## 2022-05-26 MED ORDER — CARVEDILOL 3.125 MG PO TABS: 3.1250 mg | ORAL_TABLET | Freq: Two times a day (BID) | ORAL | 1 refills | Status: DC

## 2022-05-26 NOTE — Progress Notes (Signed)
Office Visit    Patient Name: Riley Jones Date of Encounter: 05/26/2022  PCP:  Aldine Contes, [place]  Cardiologist:  Jenkins Rouge, MD  Advanced Practice Provider:  No care team member to display Electrophysiologist:  None   HPI    Riley Jones is a 66 y.o. male with known CAD (LAD stent in 2015), ongoing tobacco use, diabetes mellitus type 2, HIV, RA, HLD, HTN, and chronic pain syndrome presents today for annual follow-up appointment.  Last ischemic evaluation 03/27/2017 with normal Myoview no ischemia EF 50%.  He continues to smoke and had a rash with nicotine patch and Chantix was not helpful.  He had a history of lung cancer, CT scan 10/17/2020 was benign with just emphysema given his 44-pack-year smoking history and poorly controlled A1c.  Discussed need for updated stress test last year with given CAD and ongoing risk factors.  Lexiscan Myoview was performed which showed LVEF 53%, no ischemia.  Lower study.  Today, he states that he has no energy.  He does not have any chest pain or shortness of breath.  He was diagnosed with low testosterone with his primary care but they are afraid to treat him with any testosterone replacements due to his history of cardiac stents.  He states that he has stopped smoking and nothing really tastes good.  He also has had more issues with reflux.  BP was slightly elevated today.  Reports no shortness of breath nor dyspnea on exertion. Reports no chest pain, pressure, or tightness. No edema, orthopnea, PND. Reports no palpitations.    Past Medical History    Past Medical History:  Diagnosis Date   Anxiety    Arthritis    "pretty much all over"   Asthma    "mild" (07/17/2014)   Atypical angina ([place])    Myoview 2003, Normal EF 64%   CAD (coronary artery disease)    a. 10/20 DES to 80% mid LAD   [place] rash of groin 11/03/2019   COPD (chronic obstructive pulmonary disease) ([place])    "mild" (07/17/2014)    Depression    Fatigue 06/22/2019   Foot pain 03/30/2011   GERD (gastroesophageal reflux disease)    H/O hiatal hernia    Headache    "weekly" (06/2014)   History of gout    HIV (human immunodeficiency virus infection) ([place]) 1995   DX after shingles followed by Dr. Ola Spurr (ID)   Hx of cardiovascular stress test    ETT-Myoview (3/16):  Ex 10:15, No Ischemia, EF 57%; Normal Study   Hyperlipidemia    Hypertension    Well Controlled off meds   Increased anion gap metabolic acidosis 01/04/8118   Kidney stones    "passed them all"   Left lower quadrant abdominal pain 11/02/2019   Other and unspecified angina pectoris 12/18/2008   Overview:  Overview:  Greatly appreciate PCP f/u.   Last Assessment & Plan:  - Patient with episode of CP - 4 days, constant, substernal, not immediately resolved with NTG, felt better after burping and passing gas - Repeat EKG done here with no acute ST/T wave changes - CP now completely resolved. Encouraged patient to call 911 if recurs - To f/u with cardio in the next 2 weeks for further assess   Pneumonia 4-5 times   Rheumatoid arthritis(714.0)    Followed by Dr. Charlestine Night, off MTX and prdnisone since 2007   Right hip pain 04/25/2019   Right shoulder pain  01/19/2018   Shingles (herpes zoster) polyneuropathy 11/08/2018   Sleep apnea    does not wear mask (07/17/2014)   Type II diabetes mellitus ([place])    Past Surgical History:  Procedure Laterality Date   APPENDECTOMY  ~ [place] Left 1990's   CORONARY ANGIOPLASTY WITH STENT PLACEMENT  07/17/2014   a. DES to 80% mid LAD   [place] / UMBILICAL / VENTRAL HERNIA REPAIR  1990's   "it was a double; not inguinal"   LEFT HEART CATHETERIZATION WITH CORONARY ANGIOGRAM N/A 07/17/2014   Procedure: LEFT HEART CATHETERIZATION WITH CORONARY ANGIOGRAM;  Surgeon: [name], MD;  Location: [facility] CATH LAB;  Service: Cardiovascular;  Laterality: N/A;   PERCUTANEOUS STENT  INTERVENTION  07/17/2014   Procedure: PERCUTANEOUS STENT INTERVENTION;  Surgeon: [name], MD;  Location: [facility] CATH LAB;  Service: Cardiovascular;;   TONSILLECTOMY AND ADENOIDECTOMY  1990's   TRIGGER FINGER RELEASE Left 1990's   2nd & 5th digits   UMBILICAL HERNIA REPAIR  1980's   w/mesh   UVULOPALATOPHARYNGOPLASTY (UPPP)/TONSILLECTOMY/SEPTOPLASTY  1990's    Allergies  Allergies  Allergen Reactions   Penicillins Hives, Nausea Only and Rash    Has patient had a PCN reaction causing immediate rash, facial/tongue/throat swelling, SOB or lightheadedness with hypotension: Yes Has patient had a PCN reaction causing severe rash involving mucus membranes or skin necrosis: No Has patient had a PCN reaction that required hospitalization: No Has patient had a PCN reaction occurring within the last 10 years: No If all of the above answers are "NO", then may proceed with Cephalosporin use.    Zoloft [Sertraline Hcl] Other (See Comments)    Patient reported Psychomotor slowing / worsened depression   Victoza [Liraglutide]     Did not tolerate this medication.  Complained of dizziness even at the lowest dose   Chantix [Varenicline] Hives   Lisinopril Cough   Ozempic (0.25 Or 0.5 Mg-Dose) [Semaglutide(0.25 Or 0.'5mg'$ -Dos)] Itching and Rash    Rash to chest wall.   [name] Marcellina Millin Degludec] Rash    EKGs/Labs/Other Studies Reviewed:   The following studies were reviewed today: Lexiscan Myoview 04/23/2021  Nuclear stress EF: 53%. The left ventricular ejection fraction is mildly decreased (45-54%). There is a mild, fixed moderate sized perfusion defect in the mid-to-apical inferior wall that appears slightly worse at rest consistent with artifact likely related to diaphragmatic attenuation and increased tracer uptake in the gut. This is a low risk study.   Riley Kaufman, MD  EKG:  EKG is  ordered today.  The ekg ordered today demonstrates normal sinus rhythm, 80 bpm, RBBB  (old)  Recent Labs: 12/30/2021: BUN 26; Creatinine, Ser 0.98; Potassium 4.5; Sodium 136 05/26/2022: Hemoglobin 15.2; Platelets 60  Recent Lipid Panel    Component Value Date/Time   CHOL 129 05/16/2021 1339   CHOL 145 01/15/2020 0918   TRIG 246 (H) 05/16/2021 1339   HDL 38 (L) 05/16/2021 1339   HDL 39 (L) 01/15/2020 0918   CHOLHDL 3.4 05/16/2021 1339   VLDL UNABLE TO CALCULATE IF TRIGLYCERIDE OVER 400 mg/dL 02/26/2017 2018   LDLCALC 60 05/16/2021 1339    Home Medications   Current Meds  Medication Sig   albuterol (VENTOLIN HFA) 108 (90 Base) MCG/ACT inhaler Inhale 2 puffs into the lungs every 6 (six) hours as needed for wheezing or shortness of breath.   aspirin 81 MG EC tablet Take 81 mg by mouth daily.  atorvastatin (LIPITOR) 40 MG tablet Take 1 tablet (40 mg total) by mouth daily.   BD PEN NEEDLE NANO 2ND GEN 32G X 4 MM MISC USE UP TO 8 PER WEEK   bictegravir-emtricitabine-tenofovir AF (BIKTARVY) 50-200-25 MG TABS tablet Take 1 tablet by mouth daily.   carvedilol (COREG) 3.125 MG tablet Take 1 tablet (3.125 mg total) by mouth 2 (two) times daily with a meal.   empagliflozin (JARDIANCE) 25 MG TABS tablet Take 1 tablet (25 mg total) by mouth daily before breakfast.   fluticasone (FLONASE) 50 MCG/ACT nasal spray Place 1 spray into both nostrils daily.   HYDROcodone-acetaminophen (NORCO) 7.5-325 MG tablet Take 1 tablet by mouth every 6 (six) hours as needed for moderate pain.   hydrOXYzine (ATARAX) 10 MG tablet TAKE 1 TABLET BY MOUTH THREE TIMES DAILY AS NEEDED FOR ITCHING   insulin glargine (LANTUS SOLOSTAR) 100 UNIT/ML Solostar Pen Inject 40 Units into the skin daily at 10 pm.   loratadine (CLARITIN) 10 MG tablet Take 1 tablet (10 mg total) by mouth daily.   losartan-hydrochlorothiazide (HYZAAR) 50-12.5 MG tablet Take 1 tablet by mouth daily.   metFORMIN (GLUCOPHAGE-XR) 500 MG 24 hr tablet TAKE 2 TABLETS TWICE A DAY WITH MEALS   Multiple Vitamins-Minerals (CENTRUM ADULTS PO) Take  1 tablet by mouth daily.   nitroGLYCERIN (NITROSTAT) 0.4 MG SL tablet PLACE 1 TABLET UNDER THE TONGUE EVERY 5 MINUTES AS NEEDED FOR CHEST PAIN   omeprazole (PRILOSEC) 20 MG capsule TAKE 1 CAPSULE BY MOUTH AS NEEDED   Semaglutide, 2 MG/DOSE, 8 MG/3ML SOPN Inject 2 mg into the skin once a week.   tadalafil (CIALIS) 5 MG tablet Take 1 tablet (5 mg total) by mouth daily.   terazosin (HYTRIN) 2 MG capsule Take 1 capsule (2 mg total) by mouth at bedtime.   [DISCONTINUED] clopidogrel (PLAVIX) 75 MG tablet Take 1 tablet (75 mg total) by mouth daily.     Review of Systems      All other systems reviewed and are otherwise negative except as noted above.  Physical Exam    VS:  BP (!) 140/70   Pulse 80   Ht '5\' 10"'$  (1.778 m)   Wt 205 lb 6.4 oz (93.2 kg)   SpO2 96%   BMI 29.47 kg/m  , BMI Body mass index is 29.47 kg/m.  Wt Readings from Last 3 Encounters:  05/26/22 205 lb 6.4 oz (93.2 kg)  04/07/22 204 lb 6.4 oz (92.7 kg)  02/17/22 198 lb 12.8 oz (90.2 kg)     GEN: Well nourished, well developed, in no acute distress. HEENT: normal. Neck: Supple, no JVD, carotid bruits, or masses. Cardiac: RRR, + systolic murmurs, rubs, or gallops. No clubbing, cyanosis, edema.  Radials/PT 2+ and equal bilaterally.  Respiratory:  Respirations regular and unlabored, clear to auscultation bilaterally. GI: Soft, nontender, nondistended. MS: No deformity or atrophy. Skin: Warm and dry, no rash. Neuro:  Strength and sensation are intact. Psych: Normal affect.  Assessment & Plan    CAD with prior LAD PCI 2015 -No further issues with chest pain or shortness of breath -Continue on GDMT (aspirin 81 mg daily, Lipitor 40 mg daily, Coreg 3.125 mg twice daily, Plavix 75 mg daily, Jardiance 25 mg daily, Hyzaar 50-12.5 mg daily, and as needed nitroglycerin -Will order an echocardiogram due to fatigue  Tobacco abuse -quit cold [place] back in April  Diabetes mellitus uncontrolled -8.3 A1C -still working on  that -Worried about his metformin and the side effects  Hyperlipidemia -  follow-up with the lipid panel which was just done by primary  Hypertension -track BP at home -BP log provided to the patient today  History of HIV -stable        Disposition: Follow up 1 year with Jenkins Rouge, MD or APP.  Signed, Elgie Collard, PA-C 05/26/2022, 4:57 PM [facility]

## 2022-05-26 NOTE — Patient Instructions (Signed)
Medication Instructions:  Your physician recommends that you continue on your current medications as directed. Please refer to the Current Medication list given to you today.  *If you need a refill on your cardiac medications before your next appointment, please call your pharmacy*   Lab Work: None If you have labs (blood work) drawn today and your tests are completely normal, you will receive your results only by: [place] (if you have MyChart) OR A paper copy in the mail If you have any lab test that is abnormal or we need to change your treatment, we will call you to review the results.   Testing/Procedures: Your physician has requested that you have an echocardiogram. Echocardiography is a painless test that uses sound waves to create images of your heart. It provides your doctor with information about the size and shape of your heart and how well your heart's chambers and valves are working. This procedure takes approximately one hour. There are no restrictions for this procedure.    Follow-Up: At [facility], you and your health needs are our priority.  As part of our continuing mission to provide you with exceptional heart care, we have created designated Provider Care Teams.  These Care Teams include your primary Cardiologist (physician) and Advanced Practice Providers (APPs -  Physician Assistants and Nurse Practitioners) who all work together to provide you with the care you need, when you need it.  Your next appointment:   1 year(s)  The format for your next appointment:   In Person  Provider:   Jenkins Rouge, MD    Other Instructions Check your blood pressure daily over the next 2 weeks one hour after taking your morning medications. Keep a log of the readings and send them to [place] via your MyChart or call us.  Important Information About Sugar

## 2022-05-27 ENCOUNTER — Other Ambulatory Visit: Payer: Self-pay

## 2022-05-27 LAB — URINE CYTOLOGY ANCILLARY ONLY
Chlamydia: NEGATIVE
Comment: NEGATIVE
Comment: NORMAL
Neisseria Gonorrhea: NEGATIVE

## 2022-05-27 LAB — T-HELPER CELL (CD4) - (RCID CLINIC ONLY)
CD4 % Helper T Cell: 44 % (ref 33–65)
CD4 T Cell Abs: 704 /uL (ref 400–1790)

## 2022-05-27 MED ORDER — HYDROCODONE-ACETAMINOPHEN 7.5-325 MG PO TABS: 1.0000 | ORAL_TABLET | Freq: Four times a day (QID) | ORAL | 0 refills | Status: DC | PRN

## 2022-05-27 NOTE — Patient Outreach (Signed)
[facility]) Care Management  05/27/2022  Riley Jones 06-16-1956 [id number]   Telephone call to patient for nurse call. Patient states he is doing ok.  Patient thinks he had an annual wellness visit but not sure.  Advised to discuss with PCP office.  He verbalized understanding.    Discussed [facility] services with patient for support. Patient declined at this time.    Plan: RN CM will close case.    [name], RN, MSN [facility] Care Management Care Management Coordinator Direct Line [phone] Toll Free: [phone]  Fax: [phone]

## 2022-05-27 NOTE — Telephone Encounter (Signed)
Last ToxAssure and visit was. 04/07/2022

## 2022-05-27 NOTE — Patient Outreach (Signed)
[facility]) Care Management  05/27/2022  Riley Jones March 15, 1956 [id number]   Telephone call to patient for nurse call.  No answer. Unable to leave a message.  Plan: RN CM will attempt again next week.  [name], RN, MSN [facility] Care Management Care Management Coordinator Direct Line [phone] Toll Free: [phone]  Fax: [phone]

## 2022-05-28 LAB — COMPLETE METABOLIC PANEL WITH GFR
AG Ratio: 1.8 (calc) (ref 1.0–2.5)
ALT: 51 U/L — ABNORMAL HIGH (ref 9–46)
AST: 40 U/L — ABNORMAL HIGH (ref 10–35)
Albumin: 4.4 g/dL (ref 3.6–5.1)
Alkaline phosphatase (APISO): 69 U/L (ref 35–144)
BUN: 14 mg/dL (ref 7–25)
CO2: 26 mmol/L (ref 20–32)
Calcium: 10.3 mg/dL (ref 8.6–10.3)
Chloride: 105 mmol/L (ref 98–110)
Creat: 0.94 mg/dL (ref 0.70–1.35)
Globulin: 2.5 g/dL (calc) (ref 1.9–3.7)
Glucose, Bld: 170 mg/dL — ABNORMAL HIGH (ref 65–99)
Potassium: 4.6 mmol/L (ref 3.5–5.3)
Sodium: 141 mmol/L (ref 135–146)
Total Bilirubin: 0.6 mg/dL (ref 0.2–1.2)
Total Protein: 6.9 g/dL (ref 6.1–8.1)
eGFR: 89 mL/min/{1.73_m2} (ref >=60)

## 2022-05-28 LAB — CBC WITH DIFFERENTIAL/PLATELET
Absolute Monocytes: 517 cells/uL (ref 200–950)
Basophils Absolute: 61 cells/uL (ref 0–200)
Basophils Relative: 1.1 %
Eosinophils Absolute: 143 cells/uL (ref 15–500)
Eosinophils Relative: 2.6 %
HCT: 43.1 % (ref 38.5–50.0)
Hemoglobin: 15.2 g/dL (ref 13.2–17.1)
Lymphs Abs: 1683 cells/uL (ref 850–3900)
MCH: 31.3 pg (ref 27.0–33.0)
MCHC: 35.3 g/dL (ref 32.0–36.0)
MCV: 88.7 fL (ref 80.0–100.0)
Monocytes Relative: 9.4 %
Neutro Abs: 3097 cells/uL (ref 1500–7800)
Neutrophils Relative %: 56.3 %
Platelets: 60 10*3/uL — ABNORMAL LOW (ref 140–400)
RBC: 4.86 10*6/uL (ref 4.20–5.80)
RDW: 13.2 % (ref 11.0–15.0)
Total Lymphocyte: 30.6 %
WBC: 5.5 10*3/uL (ref 3.8–10.8)

## 2022-05-28 LAB — LIPID PANEL
Cholesterol: 142 mg/dL (ref <200)
HDL: 45 mg/dL (ref >=40)
LDL Cholesterol (Calc): 69 mg/dL (calc)
Non-HDL Cholesterol (Calc): 97 mg/dL (calc) (ref <130)
Total CHOL/HDL Ratio: 3.2 (calc) (ref <5.0)
Triglycerides: 212 mg/dL — ABNORMAL HIGH (ref <150)

## 2022-05-28 LAB — HIV-1 RNA QUANT-NO REFLEX-BLD
HIV 1 RNA Quant: <20 Copies/mL — ABNORMAL HIGH
HIV-1 RNA Quant, Log: <1.3 Log cps/mL — ABNORMAL HIGH

## 2022-05-28 LAB — RPR: RPR Ser Ql: NONREACTIVE

## 2022-06-09 ENCOUNTER — Ambulatory Visit: Payer: Medicare HMO | Admitting: Internal Medicine

## 2022-06-09 ENCOUNTER — Telehealth: Payer: Self-pay

## 2022-06-09 ENCOUNTER — Other Ambulatory Visit: Payer: Self-pay

## 2022-06-09 ENCOUNTER — Other Ambulatory Visit (HOSPITAL_COMMUNITY): Payer: Self-pay

## 2022-06-09 ENCOUNTER — Encounter: Payer: Self-pay | Admitting: Internal Medicine

## 2022-06-09 DIAGNOSIS — N5201 Erectile dysfunction due to arterial insufficiency: Secondary | ICD-10-CM | POA: Diagnosis not present

## 2022-06-09 DIAGNOSIS — E11618 Type 2 diabetes mellitus with other diabetic arthropathy: Secondary | ICD-10-CM

## 2022-06-09 DIAGNOSIS — D696 Thrombocytopenia, unspecified: Secondary | ICD-10-CM | POA: Diagnosis not present

## 2022-06-09 DIAGNOSIS — I10 Essential (primary) hypertension: Secondary | ICD-10-CM | POA: Diagnosis not present

## 2022-06-09 DIAGNOSIS — B20 Human immunodeficiency virus [HIV] disease: Secondary | ICD-10-CM

## 2022-06-09 DIAGNOSIS — Z794 Long term (current) use of insulin: Secondary | ICD-10-CM | POA: Diagnosis not present

## 2022-06-09 NOTE — Assessment & Plan Note (Signed)
Patient is here today for HIV follow up of 20+ years.  He was most recently on [place] and saw Dr Johnnye Sima in March 2023.  His last Cabenuva injection was back in May but was discontinued due to not tolerating the injections.  He has subsequently resumed Biktarvy.  He had labs drawn on 05/26/22 notable for VL < 20, CD 4 count 704.  Will continue with Biktarvy and follow up in 12 months.  Has refills on file.

## 2022-06-09 NOTE — Assessment & Plan Note (Signed)
BP today is inadequately controlled on current regimen.  Advised PCP follow up.

## 2022-06-09 NOTE — Assessment & Plan Note (Signed)
Patient with history of low testosterone in the past and reports used to be on injections for this.  He is still experiencing ED with several conditions that may be contributory.  Advised to follow up with his PCP whom has followed him in the past for this concern.

## 2022-06-09 NOTE — Assessment & Plan Note (Signed)
Recent A1c in July was 8.7.  Follows with [facility] for primary care.

## 2022-06-09 NOTE — Assessment & Plan Note (Signed)
Recent level of 61 which is stable from September 2021.

## 2022-06-09 NOTE — Progress Notes (Signed)
[facility]   CHIEF COMPLAINT    HIV follow up.   SUBJECTIVE:    Riley DEROSIA is a 66 y.o. male with PMHx as below who presents to the clinic for HIV follow up.   Please see A&P for the details of today's visit and status of the patient's medical problems.   Patient's Medications  New Prescriptions   No medications on file  Previous Medications   ALBUTEROL (VENTOLIN HFA) 108 (90 BASE) MCG/ACT INHALER    Inhale 2 puffs into the lungs every 6 (six) hours as needed for wheezing or shortness of breath.   ASPIRIN 81 MG EC TABLET    Take 81 mg by mouth daily.     ATORVASTATIN (LIPITOR) 40 MG TABLET    Take 1 tablet (40 mg total) by mouth daily.   BD PEN NEEDLE NANO 2ND GEN 32G X 4 MM MISC    USE UP TO 8 PER WEEK   BICTEGRAVIR-EMTRICITABINE-TENOFOVIR AF (BIKTARVY) 50-200-25 MG TABS TABLET    Take 1 tablet by mouth daily.   CARVEDILOL (COREG) 3.125 MG TABLET    Take 1 tablet (3.125 mg total) by mouth 2 (two) times daily with a meal.   CLOPIDOGREL (PLAVIX) 75 MG TABLET    Take 1 tablet (75 mg total) by mouth daily.   CLOTRIMAZOLE 1 % OINT    Apply topically. Clotrimazole and betamethasone diproprionate cream USP 1%/0.05% (base)   DERMATOLOGICAL PRODUCTS, MISC. (KERASAL FUNGAL NAIL RENEWAL EX)    Apply topically.   EMPAGLIFLOZIN (JARDIANCE) 25 MG TABS TABLET    Take 1 tablet (25 mg total) by mouth daily before breakfast.   FLUTICASONE (FLONASE) 50 MCG/ACT NASAL SPRAY    Place 1 spray into both nostrils daily.   HYDROCODONE-ACETAMINOPHEN (NORCO) 7.5-325 MG TABLET    Take 1 tablet by mouth every 6 (six) hours as needed for moderate pain.   HYDROCORTISONE CREAM 1 %    Apply 1 Application topically as needed for itching.   HYDROXYZINE (ATARAX) 10 MG TABLET    TAKE 1 TABLET BY MOUTH THREE TIMES DAILY AS NEEDED FOR ITCHING   INSULIN GLARGINE (LANTUS SOLOSTAR) 100 UNIT/ML SOLOSTAR PEN    Inject 40 Units into the skin daily at 10 pm.   LORATADINE (CLARITIN) 10  MG TABLET    Take 1 tablet (10 mg total) by mouth daily.   LOSARTAN-HYDROCHLOROTHIAZIDE (HYZAAR) 50-12.5 MG TABLET    Take 1 tablet by mouth daily.   METFORMIN (GLUCOPHAGE-XR) 500 MG 24 HR TABLET    TAKE 2 TABLETS TWICE A DAY WITH MEALS   MULTIPLE VITAMINS-MINERALS (CENTRUM ADULTS PO)    Take 1 tablet by mouth daily.   NITROGLYCERIN (NITROSTAT) 0.4 MG SL TABLET    PLACE 1 TABLET UNDER THE TONGUE EVERY 5 MINUTES AS NEEDED FOR CHEST PAIN   OMEPRAZOLE (PRILOSEC) 20 MG CAPSULE    TAKE 1 CAPSULE BY MOUTH AS NEEDED   SEMAGLUTIDE, 2 MG/DOSE, 8 MG/3ML SOPN    Inject 2 mg into the skin once a week.   TADALAFIL (CIALIS) 5 MG TABLET    Take 1 tablet (5 mg total) by mouth daily.   TERAZOSIN (HYTRIN) 2 MG CAPSULE    Take 1 capsule (2 mg total) by mouth at bedtime.  Modified Medications   No medications on file  Discontinued Medications   No medications on file      Past Medical History:  Diagnosis Date   Anxiety  Arthritis    "pretty much all over"   Asthma    "mild" (07/17/2014)   Atypical angina ([place])    Myoview 2003, Normal EF 64%   CAD (coronary artery disease)    a. 10/20 DES to 80% mid LAD   Candida rash of groin 11/03/2019   COPD (chronic obstructive pulmonary disease) ([place])    "mild" (07/17/2014)   Depression    Fatigue 06/22/2019   Foot pain 03/30/2011   GERD (gastroesophageal reflux disease)    H/O hiatal hernia    Headache    "weekly" (06/2014)   History of gout    HIV (human immunodeficiency virus infection) ([place]) 1995   DX after shingles followed by Dr. Ola Spurr (ID)   Hx of cardiovascular stress test    ETT-Myoview (3/16):  Ex 10:15, No Ischemia, EF 57%; Normal Study   Hyperlipidemia    Hypertension    Well Controlled off meds   Increased anion gap metabolic acidosis 6/62/9476   Kidney stones    "passed them all"   Left lower quadrant abdominal pain 11/02/2019   Other and unspecified angina pectoris 12/18/2008   Overview:  Overview:  Greatly appreciate PCP f/u.   Last  Assessment & Plan:  - Patient with episode of CP - 4 days, constant, substernal, not immediately resolved with NTG, felt better after burping and passing gas - Repeat EKG done here with no acute ST/T wave changes - CP now completely resolved. Encouraged patient to call 911 if recurs - To f/u with cardio in the next 2 weeks for further assess   Pneumonia 4-5 times   Rheumatoid arthritis(714.0)    Followed by Dr. Charlestine Night, off MTX and prdnisone since 2007   Right hip pain 04/25/2019   Right shoulder pain 01/19/2018   Shingles (herpes zoster) polyneuropathy 11/08/2018   Sleep apnea    does not wear mask (07/17/2014)   Type II diabetes mellitus ([place])     Social History   Tobacco Use   Smoking status: Former    Packs/day: 1.00    Years: 46.00    Total pack years: 46.00    Types: Cigarettes    Quit date: 12/27/2021    Years since quitting: 0.4   Smokeless tobacco: Never  Vaping Use   Vaping Use: Never used  Substance Use Topics   Alcohol use: Not Currently    Alcohol/week: 1.0 standard drink of alcohol    Types: 1 Glasses of wine per week    Comment: 07/17/2014 "glass of wine once or twice/month"   Drug use: No    Family History  Problem Relation Age of Onset   Osteoporosis Mother    Diabetes Mother    Heart disease Father    Heart attack Father    Diabetes Father    COPD Father    Heart attack Brother    Stroke Maternal Grandmother    Stroke Maternal Grandfather    Stroke Paternal Grandmother    Stroke Paternal Grandfather     Allergies  Allergen Reactions   Penicillins Hives, Nausea Only and Rash    Has patient had a PCN reaction causing immediate rash, facial/tongue/throat swelling, SOB or lightheadedness with hypotension: Yes Has patient had a PCN reaction causing severe rash involving mucus membranes or skin necrosis: No Has patient had a PCN reaction that required hospitalization: No Has patient had a PCN reaction occurring within the last 10 years: No If all of the  above answers are "NO", then may proceed with Cephalosporin  use.    Zoloft [Sertraline Hcl] Other (See Comments)    Patient reported Psychomotor slowing / worsened depression   Victoza [Liraglutide]     Did not tolerate this medication.  Complained of dizziness even at the lowest dose   Chantix [Varenicline] Hives   Lisinopril Cough   Ozempic (0.25 Or 0.5 Mg-Dose) [Semaglutide(0.25 Or 0.'5mg'$ -Dos)] Itching and Rash    Rash to chest wall.   [name] [Insulin Degludec] Rash    Review of Systems  Constitutional: Negative.   Respiratory: Negative.    Cardiovascular: Negative.   Gastrointestinal: Negative.   Musculoskeletal:  Positive for joint pain.  Skin:  Positive for rash.     OBJECTIVE:    Vitals:   06/09/22 0821 06/09/22 0827  BP: (!) 178/89 (!) 182/92  Pulse: 64   Temp: 97.7 F (36.5 C)   TempSrc: Oral   SpO2: 97%   Weight: 203 lb (92.1 kg)   Height: '5\' 10"'$  (1.778 m)      Body mass index is 29.13 kg/m.  Physical Exam Constitutional:      Appearance: Normal appearance.  HENT:     Head: Normocephalic and atraumatic.  Eyes:     Extraocular Movements: Extraocular movements intact.     Conjunctiva/sclera: Conjunctivae normal.  Pulmonary:     Effort: Pulmonary effort is normal. No respiratory distress.  Abdominal:     General: There is no distension.     Palpations: Abdomen is soft.  Skin:    General: Skin is warm and dry.     Comments: Left lateral ankle with superficial scab about 1cm.  No drainage, redness, tenderness.  Appears c/w possible psoriatic plaque.   Neurological:     General: No focal deficit present.     Mental Status: He is alert and oriented to person, place, and time.  Psychiatric:        Mood and Affect: Mood normal.        Behavior: Behavior normal.     Labs and Microbiology:    Latest Ref Rng & Units 05/26/2022    9:12 AM 12/30/2021    9:40 AM 05/16/2021    1:39 PM  CMP  Glucose 65 - 99 mg/dL 170  299  164   BUN 7 - 25 mg/dL '14  26  14    '$ Creatinine 0.70 - 1.35 mg/dL 0.94  0.98  1.03   Sodium 135 - 146 mmol/L 141  136  139   Potassium 3.5 - 5.3 mmol/L 4.6  4.5  4.1   Chloride 98 - 110 mmol/L 105  96  105   CO2 20 - 32 mmol/L '26  23  27   '$ Calcium 8.6 - 10.3 mg/dL 10.3  9.7  10.3   Total Protein 6.1 - 8.1 g/dL 6.9   6.6   Total Bilirubin 0.2 - 1.2 mg/dL 0.6   0.7   AST 10 - 35 U/L 40   18   ALT 9 - 46 U/L 51   17       Latest Ref Rng & Units 05/26/2022    9:12 AM 05/16/2021    1:39 PM 06/11/2020    9:09 AM  CBC  WBC 3.8 - 10.8 Thousand/uL 5.5  7.1  10.0   Hemoglobin 13.2 - 17.1 g/dL 15.2  15.7  16.0   Hematocrit 38.5 - 50.0 % 43.1  46.2  44.7   Platelets 140 - 400 Thousand/uL 60  C  61     C Corrected result  Lab Results  Component Value Date   HIV1RNAQUANT <20 (H) 05/26/2022   HIV1RNAQUANT <20 DETECTED (A) 01/30/2022   HIV1RNAQUANT Not Detected 10/07/2021   CD4TABS 704 05/26/2022   CD4TABS 848 06/11/2020   CD4TABS 615 08/09/2019    RPR and STI: Lab Results  Component Value Date   LABRPR NON-REACTIVE 05/26/2022   LABRPR NON-REACTIVE 05/16/2021   LABRPR NON-REACTIVE 06/11/2020   LABRPR NON-REACTIVE 10/25/2018   LABRPR NON-REACTIVE 01/19/2018    STI Results GC CT  05/26/2022  9:30 AM Negative  Negative   08/08/2021 10:11 AM Negative  Negative   05/16/2021  2:34 PM Negative  Negative   06/11/2020  9:22 AM Negative  Negative   10/25/2018 12:00 AM Negative  Negative   01/19/2018 12:00 AM Negative  Negative   06/16/2017 12:00 AM Negative  Negative   03/02/2016 12:00 AM Negative  Negative     Hepatitis B: Lab Results  Component Value Date   HEPBSAB YES 11/22/2006   HEPBSAG NO 11/22/2006   Hepatitis C: No results found for: "HEPCAB", "HCVRNAPCRQN" Hepatitis A: Lab Results  Component Value Date   HAV REACTIVE (A) 01/30/2022   Lipids: Lab Results  Component Value Date   CHOL 142 05/26/2022   TRIG 212 (H) 05/26/2022   HDL 45 05/26/2022   CHOLHDL 3.2 05/26/2022   VLDL UNABLE TO  CALCULATE IF TRIGLYCERIDE OVER 400 mg/dL 02/26/2017   LDLCALC 69 05/26/2022    Imaging:    ASSESSMENT & PLAN:    Human immunodeficiency virus (HIV) disease ([place]) Patient is here today for HIV follow up of 20+ years.  He was most recently on [place] and saw Dr Johnnye Sima in March 2023.  His last Cabenuva injection was back in May but was discontinued due to not tolerating the injections.  He has subsequently resumed Biktarvy.  He had labs drawn on 05/26/22 notable for VL < 20, CD 4 count 704.  Will continue with Biktarvy and follow up in 12 months.  Has refills on file.   Diabetes mellitus ([facility]) Recent A1c in July was 8.7.  Follows with [facility] for primary care.  Low platelet count ([facility]) Recent level of 61 which is stable from September 2021.  Erectile dysfunction Patient with history of low testosterone in the past and reports used to be on injections for this.  He is still experiencing ED with several conditions that may be contributory.  Advised to follow up with his PCP whom has followed him in the past for this concern.   Essential hypertension BP today is inadequately controlled on current regimen.  Advised PCP follow up.     [facility] Infectious [facility] 06/09/2022, 8:55 AM   I have personally spent 40 minutes involved in face-to-face and non-face-to-face activities for this patient on the day of the visit. Professional time spent includes the following activities: Preparing to see the patient (review of tests), Obtaining and/or reviewing separately obtained history (admission/discharge record), Performing a medically appropriate examination and/or evaluation , Ordering medications/tests/procedures, referring and communicating with other health care professionals, Documenting clinical information in the EMR, Independently interpreting results (not separately reported), Communicating results to the patient/family/caregiver, Counseling  and educating the patient/family/caregiver and Care coordination (not separately reported).

## 2022-06-09 NOTE — Telephone Encounter (Signed)
[facility] Patient Advocate Encounter   I was successful in securing patient a $ 7500.00 grant from [employer] to provide copayment coverage for Biktarvy.  The patient's out of pocket cost will be $5.00 monthly.     I have spoken with the patient.         Dates of Eligibility: 05/25/22 through 09/27/22  Patient knows to call the office with questions or concerns.  Ileene Patrick, [facility] Patient [facility] for Infectious Disease Phone: [phone] Fax:  [phone]

## 2022-06-10 ENCOUNTER — Ambulatory Visit (HOSPITAL_COMMUNITY): Payer: Medicare HMO | Attending: Physician Assistant

## 2022-06-10 DIAGNOSIS — R5383 Other fatigue: Secondary | ICD-10-CM | POA: Diagnosis not present

## 2022-06-10 DIAGNOSIS — I2510 Atherosclerotic heart disease of native coronary artery without angina pectoris: Secondary | ICD-10-CM | POA: Insufficient documentation

## 2022-06-10 LAB — ECHOCARDIOGRAM COMPLETE
Area-P 1/2: 3.05 cm2
S' Lateral: 2.7 cm

## 2022-06-13 DIAGNOSIS — E1165 Type 2 diabetes mellitus with hyperglycemia: Secondary | ICD-10-CM | POA: Diagnosis not present

## 2022-06-16 ENCOUNTER — Other Ambulatory Visit: Payer: Self-pay | Admitting: Internal Medicine

## 2022-06-16 DIAGNOSIS — Z79891 Long term (current) use of opiate analgesic: Secondary | ICD-10-CM

## 2022-06-16 MED ORDER — HYDROCODONE-ACETAMINOPHEN 7.5-325 MG PO TABS: 1.0000 | ORAL_TABLET | Freq: Four times a day (QID) | ORAL | 0 refills | Status: DC | PRN

## 2022-06-16 NOTE — Telephone Encounter (Signed)
Last Appointent 04/07/2022.  Last ToxAssure 04/07/2022. No future appointment scheduled .

## 2022-07-03 ENCOUNTER — Other Ambulatory Visit: Payer: Self-pay | Admitting: Internal Medicine

## 2022-07-03 DIAGNOSIS — Z794 Long term (current) use of insulin: Secondary | ICD-10-CM

## 2022-07-15 ENCOUNTER — Other Ambulatory Visit: Payer: Self-pay | Admitting: Internal Medicine

## 2022-07-15 DIAGNOSIS — Z79891 Long term (current) use of opiate analgesic: Secondary | ICD-10-CM

## 2022-07-16 MED ORDER — HYDROCODONE-ACETAMINOPHEN 7.5-325 MG PO TABS: 1.0000 | ORAL_TABLET | Freq: Four times a day (QID) | ORAL | 0 refills | Status: DC | PRN

## 2022-07-16 NOTE — Telephone Encounter (Signed)
Last appointment and ToxAssure 04/07/2022 .  No pending appointment.

## 2022-07-23 ENCOUNTER — Encounter: Payer: Self-pay | Admitting: Internal Medicine

## 2022-07-29 NOTE — Progress Notes (Signed)
CARDIOLOGY OFFICE NOTE  Date:  08/07/2022    [name] Date of Birth: Dec 10, 1955 Medical Record #[id number]  PCP:  Aldine Contes, MD  Cardiologist:  Gillian Shields    No chief complaint on file.    History of Present Illness: Riley Jones is a 66 y.o. male has a history of known CAD with LAD stent in 2015, ongoing tobacco use, DM-2, HIV, RA, HLD, HTN and chronic pain syndrome. Last ischemic evaluation 02/28/17 normal myovue no ischemia EF 53% He continues to smoke had rash with nicotine patch and Chantix not helpful. Lung cancer CT 04/17/22  benign just emphysema given 44 pack year smoking history DM poorly controlled with A1c over 8 Activity limited by RA with hand, back, hip pain on Norco    Father died [place] and his land is very valuable Patient and his sister could inherit millions  Myovue 04/23/21 normal no ischemia EF 53% TTE EF 65-70% mild LVH , AV sclerosis Ao 4.1 cm  [facility] lives in [place] smoking in April      Past Medical History:  Diagnosis Date   Anxiety    Arthritis    "pretty much all over"   Asthma    "mild" (07/17/2014)   Atypical angina    Myoview 2003, Normal EF 64%   CAD (coronary artery disease)    a. 10/20 DES to 80% mid LAD   Candida rash of groin 11/03/2019   COPD (chronic obstructive pulmonary disease) ([place])    "mild" (07/17/2014)   Depression    Fatigue 06/22/2019   Foot pain 03/30/2011   GERD (gastroesophageal reflux disease)    H/O hiatal hernia    Headache    "weekly" (06/2014)   History of gout    HIV (human immunodeficiency virus infection) ([place]) 1995   DX after shingles followed by Dr. Ola Spurr (ID)   Hx of cardiovascular stress test    ETT-Myoview (3/16):  Ex 10:15, No Ischemia, EF 57%; Normal Study   Hyperlipidemia    Hypertension    Well Controlled off meds   Increased anion gap metabolic acidosis 5/46/2703   Kidney stones    "passed them all"   Left lower quadrant abdominal pain  11/02/2019   Other and unspecified angina pectoris 12/18/2008   Overview:  Overview:  Greatly appreciate PCP f/u.   Last Assessment & Plan:  - Patient with episode of CP - 4 days, constant, substernal, not immediately resolved with NTG, felt better after burping and passing gas - Repeat EKG done here with no acute ST/T wave changes - CP now completely resolved. Encouraged patient to call 911 if recurs - To f/u with cardio in the next 2 weeks for further assess   Pneumonia 4-5 times   Rheumatoid arthritis(714.0)    Followed by Dr. Charlestine Night, off MTX and prdnisone since 2007   Right hip pain 04/25/2019   Right shoulder pain 01/19/2018   Shingles (herpes zoster) polyneuropathy 11/08/2018   Sleep apnea    does not wear mask (07/17/2014)   Type II diabetes mellitus ([place])     Past Surgical History:  Procedure Laterality Date   APPENDECTOMY  ~ [place] Left 1990's   CORONARY ANGIOPLASTY WITH STENT PLACEMENT  07/17/2014   a. DES to 80% mid LAD   [place] / UMBILICAL / VENTRAL HERNIA REPAIR  1990's   "it was a double; not inguinal"  LEFT HEART CATHETERIZATION WITH CORONARY ANGIOGRAM N/A 07/17/2014   Procedure: LEFT HEART CATHETERIZATION WITH CORONARY ANGIOGRAM;  Surgeon: [name], MD;  [facility];  Service: Cardiovascular;  Laterality: N/A;   PERCUTANEOUS STENT INTERVENTION  07/17/2014   Procedure: PERCUTANEOUS STENT INTERVENTION;  Surgeon: [name], MD;  Location: [facility] C[facility] LAB;  Service: Cardiovascular;;   TONSILLECTOMY AND ADENOIDECTOMY  1990's   TRIGGER FINGER RELEASE Left 1990's   2nd & 5th digits   UMBILICAL HERNIA REPAIR  1980's   w/mesh   UVULOPALATOPHARYNGOPLASTY (UPPP)/TONSILLECTOMY/SEPTOPLASTY  1990's     Medications: Current Meds  Medication Sig   albuterol (VENTOLIN HFA) 108 (90 Base) MCG/ACT inhaler Inhale 2 puffs into the lungs every 6 (six) hours as needed for wheezing or shortness of breath.   aspirin  81 MG EC tablet Take 81 mg by mouth daily.     atorvastatin (LIPITOR) 40 MG tablet Take 1 tablet (40 mg total) by mouth daily.   BD PEN NEEDLE NANO 2ND GEN 32G X 4 MM MISC USE UP TO 8 PER WEEK   bictegravir-emtricitabine-tenofovir AF (BIKTARVY) 50-200-25 MG TABS tablet Take 1 tablet by mouth daily.   carvedilol (COREG) 3.125 MG tablet Take 1 tablet (3.125 mg total) by mouth 2 (two) times daily with a meal.   clopidogrel (PLAVIX) 75 MG tablet Take 1 tablet (75 mg total) by mouth daily.   Clotrimazole 1 % OINT Apply topically. Clotrimazole and betamethasone diproprionate cream USP 1%/0.05% (base)   Dermatological Products, Misc. (KERASAL FUNGAL NAIL RENEWAL EX) Apply topically.   empagliflozin (JARDIANCE) 25 MG TABS tablet Take 1 tablet (25 mg total) by mouth daily before breakfast.   fluticasone (FLONASE) 50 MCG/ACT nasal spray Place 1 spray into both nostrils daily.   HYDROcodone-acetaminophen (NORCO) 7.5-325 MG tablet Take 1 tablet by mouth every 6 (six) hours as needed for moderate pain.   hydrocortisone cream 1 % Apply 1 Application topically as needed for itching.   hydrOXYzine (ATARAX) 10 MG tablet TAKE 1 TABLET BY MOUTH THREE TIMES DAILY AS NEEDED FOR ITCHING   insulin glargine (LANTUS SOLOSTAR) 100 UNIT/ML Solostar Pen Inject 40 Units into the skin daily at 10 pm.   loratadine (CLARITIN) 10 MG tablet Take 1 tablet (10 mg total) by mouth daily.   losartan-hydrochlorothiazide (HYZAAR) 50-12.5 MG tablet Take 1 tablet by mouth daily.   metFORMIN (GLUCOPHAGE-XR) 500 MG 24 hr tablet TAKE 2 TABLETS BY MOUTH TWICE A DAY WITH MEALS   Multiple Vitamins-Minerals (CENTRUM ADULTS PO) Take 1 tablet by mouth daily.   nitroGLYCERIN (NITROSTAT) 0.4 MG SL tablet PLACE 1 TABLET UNDER THE TONGUE EVERY 5 MINUTES AS NEEDED FOR CHEST PAIN   omeprazole (PRILOSEC) 20 MG capsule TAKE 1 CAPSULE BY MOUTH AS NEEDED   Semaglutide, 2 MG/DOSE, 8 MG/3ML SOPN Inject 2 mg into the skin once a week.   tadalafil (CIALIS) 5  MG tablet Take 1 tablet (5 mg total) by mouth daily.   terazosin (HYTRIN) 2 MG capsule Take 1 capsule (2 mg total) by mouth at bedtime.     Allergies: Allergies  Allergen Reactions   Penicillins Hives, Nausea Only and Rash    Has patient had a PCN reaction causing immediate rash, facial/tongue/throat swelling, SOB or lightheadedness with hypotension: Yes Has patient had a PCN reaction causing severe rash involving mucus membranes or skin necrosis: No Has patient had a PCN reaction that required hospitalization: No Has patient had a PCN reaction occurring within the last 10 years: No If  all of the above answers are "NO", then may proceed with Cephalosporin use.    Zoloft [Sertraline Hcl] Other (See Comments)    Patient reported Psychomotor slowing / worsened depression   Victoza [Liraglutide]     Did not tolerate this medication.  Complained of dizziness even at the lowest dose   Chantix [Varenicline] Hives   Lisinopril Cough   Ozempic (0.25 Or 0.5 Mg-Dose) [Semaglutide(0.25 Or 0.'5mg'$ -Dos)] Itching and Rash    Rash to chest wall.   [name] [Insulin Degludec] Rash    Social History: The patient  reports that he quit smoking about 7 months ago. His smoking use included cigarettes. He has a 46.00 pack-year smoking history. He has never used smokeless tobacco. He reports that he does not currently use alcohol after a past usage of about 1.0 standard drink of alcohol per week. He reports that he does not use drugs.   Family History: The patient's family history includes COPD in his father; Diabetes in his father and mother; Heart attack in his brother and father; Heart disease in his father; Osteoporosis in his mother; Stroke in his maternal grandfather, maternal grandmother, paternal grandfather, and paternal grandmother.   Review of Systems: Please see the history of present illness.   All other systems are reviewed and negative.   Physical Exam: VS:  BP 132/78   Pulse 70   Ht '5\' 10"'$   (1.778 m)   Wt 199 lb (90.3 kg)   SpO2 97%   BMI 28.55 kg/m  .  BMI Body mass index is 28.55 kg/m.  Wt Readings from Last 3 Encounters:  08/07/22 199 lb (90.3 kg)  06/09/22 203 lb (92.1 kg)  05/26/22 205 lb 6.4 oz (93.2 kg)    General: Alert and in no acute distress.   Cardiac: Regular rate and rhythm. Heart tones are distant. No edema.  Respiratory:  Lungs are coarse but with normal work of breathing.  GI: Soft and nontender.  MS: No deformity or atrophy. Gait and ROM intact.  Skin: Warm and dry. Color is normal.  Neuro:  Strength and sensation are intact and no gross focal deficits noted.  Psych: Alert, appropriate and with normal affect.   LABORATORY DATA:  EKG:  EKG is ordered today.  Personally reviewed by me. This demonstrates NSR with PAC noted. HR is 72 today.   Lab Results  Component Value Date   WBC 5.5 05/26/2022   HGB 15.2 05/26/2022   HCT 43.1 05/26/2022   PLT 60 (L) 05/26/2022   GLUCOSE 170 (H) 05/26/2022   CHOL 142 05/26/2022   TRIG 212 (H) 05/26/2022   HDL 45 05/26/2022   LDLCALC 69 05/26/2022   ALT 51 (H) 05/26/2022   AST 40 (H) 05/26/2022   NA 141 05/26/2022   K 4.6 05/26/2022   CL 105 05/26/2022   CREATININE 0.94 05/26/2022   BUN 14 05/26/2022   CO2 26 05/26/2022   TSH 1.590 06/22/2019   PSA 1.09 05/07/2009   INR 0.9 07/10/2014   HGBA1C 8.7 (A) 04/07/2022   MICROALBUR 1.9 11/22/2014     BNP (last 3 results) No results for input(s): "BNP" in the last 8760 hours.  ProBNP (last 3 results) No results for input(s): "PROBNP" in the last 8760 hours.   Other Studies Reviewed Today:  Echo Study Conclusions 02/2017   - Left ventricle: The cavity size was normal. Wall thickness was   increased in a pattern of mild LVH. Systolic function was normal.   The estimated ejection  fraction was in the range of 60% to 65%.   Wall motion was normal; there were no regional wall motion   abnormalities. - Aortic valve: Trileaflet; mildly calcified  leaflets. - Mitral valve: Calcified annulus. - Right ventricle: The cavity size was mildly dilated. Wall   thickness was normal. Systolic function was normal.   Myoview Study Result 02/2017     There was no ST segment deviation noted during stress. The study is normal. This is a low risk study. Nuclear stress EF: 53%.   Low risk stress nuclear study with normal perfusion and low normal left ventricular global systolic function.        ASSESSMENT & PLAN:     1. CAD - prior LAD PCI from 2015 - known residual disease - needs aggressive CV risk factor modification. He has no worrisome symptoms  He has been maintained on chronic DAPT, statin and beta blocker. Non ischemic myovue 04/18/22   2. Tobacco abuse - Quit date April congratulated him on this lung cancer screening CT negative 04/18/22    3. DM - uncontrolled. A1c 8.7  f/u primary Dr Dareen Piano   4. HLD - on statin - labs 05/26/22 LDL 69   5. HTN - BP looks good on his current regimen - no changes made today.   6. HIV - followed by Dr. Johnnye Sima  HIV RNA quant < 20 T count 704  7. Decreased platelet count - 60 05/26/22     Disposition:   FU in a year    Patient is agreeable to this plan and will call if any problems develop in the interim.   Signed: Jenkins Rouge, MD  08/07/2022 8:50 AM  [place] [address] [place], [place]  [address] Phone: [phone] Fax: [phone]

## 2022-08-07 ENCOUNTER — Ambulatory Visit: Payer: Medicare HMO | Attending: Cardiovascular Disease | Admitting: Cardiovascular Disease

## 2022-08-07 ENCOUNTER — Encounter: Payer: Self-pay | Admitting: Cardiovascular Disease

## 2022-08-07 VITALS — BP 132/78 | HR 70 | Ht 70.0 in | Wt 199.0 lb

## 2022-08-07 DIAGNOSIS — E782 Mixed hyperlipidemia: Secondary | ICD-10-CM

## 2022-08-07 DIAGNOSIS — I10 Essential (primary) hypertension: Secondary | ICD-10-CM

## 2022-08-07 DIAGNOSIS — I2510 Atherosclerotic heart disease of native coronary artery without angina pectoris: Secondary | ICD-10-CM | POA: Diagnosis not present

## 2022-08-07 DIAGNOSIS — B20 Human immunodeficiency virus [HIV] disease: Secondary | ICD-10-CM

## 2022-08-07 NOTE — Patient Instructions (Signed)
Medication Instructions:  Your physician recommends that you continue on your current medications as directed. Please refer to the Current Medication list given to you today.  *If you need a refill on your cardiac medications before your next appointment, please call your pharmacy*  Lab Work: If you have labs (blood work) drawn today and your tests are completely normal, you will receive your results only by: [place] (if you have MyChart) OR A paper copy in the mail If you have any lab test that is abnormal or we need to change your treatment, we will call you to review the results.  Testing/Procedures: None ordered today.  Follow-Up: At [facility], you and your health needs are our priority.  As part of our continuing mission to provide you with exceptional heart care, we have created designated Provider Care Teams.  These Care Teams include your primary Cardiologist (physician) and Advanced Practice Providers (APPs -  Physician Assistants and Nurse Practitioners) who all work together to provide you with the care you need, when you need it.  We recommend signing up for the patient portal called "MyChart".  Sign up information is provided on this After Visit Summary.  MyChart is used to connect with patients for Virtual Visits (Telemedicine).  Patients are able to view lab/test results, encounter notes, upcoming appointments, etc.  Non-urgent messages can be sent to your provider as well.   To learn more about what you can do with MyChart, go to [email or web address].    Your next appointment:   12 month(s)  The format for your next appointment:   In Person  Provider:   Jenkins Rouge, MD     Important Information About Sugar

## 2022-08-11 ENCOUNTER — Other Ambulatory Visit: Payer: Self-pay | Admitting: Internal Medicine

## 2022-08-11 ENCOUNTER — Encounter: Payer: Self-pay | Admitting: Internal Medicine

## 2022-08-11 DIAGNOSIS — Z79891 Long term (current) use of opiate analgesic: Secondary | ICD-10-CM

## 2022-08-11 MED ORDER — HYDROCODONE-ACETAMINOPHEN 7.5-325 MG PO TABS: 1.0000 | ORAL_TABLET | Freq: Four times a day (QID) | ORAL | 0 refills | Status: DC | PRN

## 2022-08-11 NOTE — Telephone Encounter (Signed)
Last rx written 07/16/22. Last OV  04/07/22. Next OV - has not been scheduled. [place]  04/07/22.

## 2022-08-11 NOTE — Telephone Encounter (Signed)
Last [facility] was 04/07/2022 Last appointment 04/07/2022.  No future appointments.

## 2022-08-12 ENCOUNTER — Encounter: Payer: Self-pay | Admitting: Student

## 2022-08-12 ENCOUNTER — Ambulatory Visit (INDEPENDENT_AMBULATORY_CARE_PROVIDER_SITE_OTHER): Payer: Medicare HMO | Admitting: Student

## 2022-08-12 VITALS — BP 134/70 | HR 65 | Temp 97.7°F | Ht 70.0 in | Wt 201.2 lb

## 2022-08-12 DIAGNOSIS — E1151 Type 2 diabetes mellitus with diabetic peripheral angiopathy without gangrene: Secondary | ICD-10-CM

## 2022-08-12 DIAGNOSIS — I872 Venous insufficiency (chronic) (peripheral): Secondary | ICD-10-CM | POA: Insufficient documentation

## 2022-08-12 DIAGNOSIS — D696 Thrombocytopenia, unspecified: Secondary | ICD-10-CM | POA: Diagnosis not present

## 2022-08-12 DIAGNOSIS — Z794 Long term (current) use of insulin: Secondary | ICD-10-CM | POA: Diagnosis not present

## 2022-08-12 DIAGNOSIS — E11618 Type 2 diabetes mellitus with other diabetic arthropathy: Secondary | ICD-10-CM

## 2022-08-12 DIAGNOSIS — E1142 Type 2 diabetes mellitus with diabetic polyneuropathy: Secondary | ICD-10-CM | POA: Diagnosis not present

## 2022-08-12 DIAGNOSIS — I739 Peripheral vascular disease, unspecified: Secondary | ICD-10-CM | POA: Insufficient documentation

## 2022-08-12 LAB — POCT GLYCOSYLATED HEMOGLOBIN (HGB A1C): Hemoglobin A1C: 8.5 % — AB (ref 4.0–5.6)

## 2022-08-12 LAB — GLUCOSE, CAPILLARY: Glucose-Capillary: 188 mg/dL — ABNORMAL HIGH (ref 70–99)

## 2022-08-12 MED ORDER — GABAPENTIN 100 MG PO CAPS: 100.0000 mg | ORAL_CAPSULE | Freq: Three times a day (TID) | ORAL | 2 refills | Status: DC

## 2022-08-12 NOTE — Assessment & Plan Note (Addendum)
>>  ASSESSMENT AND PLAN FOR DIABETES MELLITUS ([place]) WRITTEN ON 08/12/2022 10:30 PM BY Fredis Malkiewicz, DO  A1c today 8.5 from 8.7. He is on metformin 1000 mg BID, Jardiance 25 mg daily, Ozempic 2 mg weekly and Lantus 38 units daily. Last visit, Ozempic increased to 2 mg and Lantus to 40 units. However, he is still taking Lantus 38 units and was unaware to increase to 40. No side effects from increased Ozempic dose. Glucose readings from past 14 days include average 197 with 40% in target range, 47% high range, and 13% very high range. No lows reported. Still poorly controlled but we discussed adherence and correct dosing of medications today.   Plan -increase Lantus to 40 units -continue metformin, Jardiance, and Ozempic  -repeat A1c in 3 months  >>ASSESSMENT AND PLAN FOR DIABETIC PERIPHERAL NEUROPATHY ([place]) WRITTEN ON 08/12/2022 10:43 PM BY Ramona Ruark, DO  Monofilament testing today showed loss of sensation at few testing points on both feet. Patient describes his legs fall asleep frequently and at times pins and needles sensation. Given uncontrolled diabetes and findings for peripheral neuropathy, discussed with patient about starting a mediation for nerve pain. Patient agrees to starting it.   Plan -start gabapentin 100 mg TID

## 2022-08-12 NOTE — Patient Instructions (Addendum)
Thank you, [name] for allowing [place] to provide your care today.  -Continue taking aspirin 81 mg daily. -Referral to vascular surgery   -A1c 8.5% today -Increase Lantus to 40 units daily -Start gabapentin 100 mg at night then can increase to three times daily if need for your feet neuropathy -Continue your metformin, jardiance and ozempic  -Blood work today to recheck platelet count  -Follow up in 1 month  I have ordered the following labs for you:  Lab Orders         Glucose, capillary         CBC no Diff         POC Hbg A1C      Referrals ordered today:   Referral Orders         Ambulatory referral to Vascular Surgery       I have ordered the following medication/changed the following medications:   Stop the following medications: There are no discontinued medications.   Start the following medications: Meds ordered this encounter  Medications   gabapentin (NEURONTIN) 100 MG capsule    Sig: Take 1 capsule (100 mg total) by mouth 3 (three) times daily.    Dispense:  90 capsule    Refill:  2     Follow up:  1 month     Should you have any questions or concerns please call the internal medicine clinic at [phone].    Angelique Blonder, D.O. [place]

## 2022-08-12 NOTE — Progress Notes (Unsigned)
CC: left leg sore  HPI:  Riley Jones is a 66 y.o. male living with a history stated below and presents today for left leg sore. Please see problem based assessment and plan for additional details.  Past Medical History:  Diagnosis Date   Anxiety    Arthritis    "pretty much all over"   Asthma    "mild" (07/17/2014)   Atypical angina    Myoview 2003, Normal EF 64%   CAD (coronary artery disease)    a. 10/20 DES to 80% mid LAD   Candida rash of groin 11/03/2019   COPD (chronic obstructive pulmonary disease) ([place])    "mild" (07/17/2014)   Depression    Fatigue 06/22/2019   Foot pain 03/30/2011   GERD (gastroesophageal reflux disease)    H/O hiatal hernia    Headache    "weekly" (06/2014)   History of gout    HIV (human immunodeficiency virus infection) ([place]) 1995   DX after shingles followed by Dr. Ola Spurr ([place])   Hx of cardiovascular stress test    ETT-Myoview (3/16):  Ex 10:15, No Ischemia, EF 57%; Normal Study   Hyperlipidemia    Hypertension    Well Controlled off meds   Increased anion gap metabolic acidosis 3/81/8299   Kidney stones    "passed them all"   Left lower quadrant abdominal pain 11/02/2019   Other and unspecified angina pectoris 12/18/2008   Overview:  Overview:  Greatly appreciate PCP f/u.   Last Assessment & Plan:  - Patient with episode of CP - 4 days, constant, substernal, not immediately resolved with NTG, felt better after burping and passing gas - Repeat EKG done here with no acute ST/T wave changes - CP now completely resolved. Encouraged patient to call 911 if recurs - To f/u with cardio in the next 2 weeks for further assess   Pneumonia 4-5 times   Rheumatoid arthritis(714.0)    Followed by Dr. Charlestine Night, off MTX and prdnisone since 2007   Right hip pain 04/25/2019   Right shoulder pain 01/19/2018   Shingles (herpes zoster) polyneuropathy 11/08/2018   Sleep apnea    does not wear mask (07/17/2014)   Type II diabetes mellitus ([place])     Current  Outpatient Medications on File Prior to Visit  Medication Sig Dispense Refill   albuterol (VENTOLIN HFA) 108 (90 Base) MCG/ACT inhaler Inhale 2 puffs into the lungs every 6 (six) hours as needed for wheezing or shortness of breath. 9 g 3   aspirin 81 MG EC tablet Take 81 mg by mouth daily.       atorvastatin (LIPITOR) 40 MG tablet Take 1 tablet (40 mg total) by mouth daily. 90 tablet 3   BD PEN NEEDLE NANO 2ND GEN 32G X 4 MM MISC USE UP TO 8 PER WEEK 100 each 1   bictegravir-emtricitabine-tenofovir AF (BIKTARVY) 50-200-25 MG TABS tablet Take 1 tablet by mouth daily. 30 tablet 11   carvedilol (COREG) 3.125 MG tablet Take 1 tablet (3.125 mg total) by mouth 2 (two) times daily with a meal. 180 tablet 1   clopidogrel (PLAVIX) 75 MG tablet Take 1 tablet (75 mg total) by mouth daily. 90 tablet 3   Clotrimazole 1 % OINT Apply topically. Clotrimazole and betamethasone diproprionate cream USP 1%/0.05% (base)     Dermatological Products, Misc. (KERASAL FUNGAL NAIL RENEWAL EX) Apply topically.     empagliflozin (JARDIANCE) 25 MG TABS tablet Take 1 tablet (25 mg total) by mouth daily before breakfast.  90 tablet 1   fluticasone (FLONASE) 50 MCG/ACT nasal spray Place 1 spray into both nostrils daily. 16 g 0   HYDROcodone-acetaminophen (NORCO) 7.5-325 MG tablet Take 1 tablet by mouth every 6 (six) hours as needed for moderate pain. 60 tablet 0   hydrocortisone cream 1 % Apply 1 Application topically as needed for itching.     hydrOXYzine (ATARAX) 10 MG tablet TAKE 1 TABLET BY MOUTH THREE TIMES DAILY AS NEEDED FOR ITCHING 30 tablet 1   insulin glargine (LANTUS SOLOSTAR) 100 UNIT/ML Solostar Pen Inject 40 Units into the skin daily at 10 pm. 15 mL 3   loratadine (CLARITIN) 10 MG tablet Take 1 tablet (10 mg total) by mouth daily. 90 tablet 3   losartan-hydrochlorothiazide (HYZAAR) 50-12.5 MG tablet Take 1 tablet by mouth daily. 90 tablet 1   metFORMIN (GLUCOPHAGE-XR) 500 MG 24 hr tablet TAKE 2 TABLETS BY MOUTH TWICE  A DAY WITH MEALS 180 tablet 3   Multiple Vitamins-Minerals (CENTRUM ADULTS PO) Take 1 tablet by mouth daily.     nitroGLYCERIN (NITROSTAT) 0.4 MG SL tablet PLACE 1 TABLET UNDER THE TONGUE EVERY 5 MINUTES AS NEEDED FOR CHEST PAIN 25 tablet 3   omeprazole (PRILOSEC) 20 MG capsule TAKE 1 CAPSULE BY MOUTH AS NEEDED 90 capsule 3   Semaglutide, 2 MG/DOSE, 8 MG/3ML SOPN Inject 2 mg into the skin once a week. 3 mL 3   tadalafil (CIALIS) 5 MG tablet Take 1 tablet (5 mg total) by mouth daily. 90 tablet 0   terazosin (HYTRIN) 2 MG capsule Take 1 capsule (2 mg total) by mouth at bedtime. 90 capsule 3   No current facility-administered medications on file prior to visit.   Review of Systems: ROS negative except for what is noted on the assessment and plan.  Vitals:   08/12/22 0843  BP: 134/70  Pulse: 65  Temp: 97.7 F (36.5 C)  TempSrc: Oral  SpO2: 100%  Weight: 201 lb 3.2 oz (91.3 kg)  Height: '5\' 10"'$  (1.778 m)   Physical Exam: Constitutional: well-appearing male, sitting in chair, in no acute distress HENT: normocephalic atraumatic Neck: supple Cardiovascular: regular rate and rhythm, 2+ DP and TP pulse bilaterally Pulmonary/Chest: normal work of breathing on room air MSK: normal bulk and tone Neurological: alert & oriented x 3, variable sensation loss in both LE with monofilament test Skin: healing circular lesion above left lateral malleolus with little surrounding erythema, mild tenderness to palpation Psych: normal mood and behavior    Assessment & Plan:   Low platelet count (HCC) Hx of low platelet count with noted clumping on previous CBC. Will repeat CBC today.   Diabetes mellitus ([place]) A1c today 8.5 from 8.7. He is on metformin 1000 mg BID, Jardiance 25 mg daily, Ozempic 2 mg weekly and Lantus 38 units daily. Last visit, Ozempic increased to 2 mg and Lantus to 40 units. However, he is still taking Lantus 38 units and was unaware to increase to 40. No side effects from increased  Ozempic dose. Glucose readings from past 14 days include average 197 with 40% in target range, 47% high range, and 13% very high range. No lows reported. Still poorly controlled but we discussed adherence and correct dosing of medications today.   Plan -increase Lantus to 40 units -continue metformin, Jardiance, and Ozempic  -repeat A1c in 3 months  Claudication [facility]) Patient reports periodic bilateral LE pain after prolonged standing or walking. Has been going on for a while but noticed left leg at  times would give out. He reports falling over due to this. He is on aspirin, Plavix and atorvastatin for CAD. Screening ABI performed in clinic today showed normal readings, Left 1.32 and Right 1.35. Exam showed healed circular lesion above left lateral malleolus with little surrounding erythema. Discussed with patient about findings and possible arterial insufficiency and he may benefit from vascular referral.   Plan -referral to vascular surgery -continue to monitor left leg wound  Diabetic peripheral neuropathy (HCC) Monofilament testing today showed loss of sensation at few testing points on both feet. Patient describes his legs fall asleep frequently and at times pins and needles sensation. Given uncontrolled diabetes and findings for peripheral neuropathy, discussed with patient about starting a mediation for nerve pain. Patient agrees to starting it.   Plan -start gabapentin 100 mg TID   Patient seen with Dr. Andris Baumann, D.O. [facility], PGY-1 Phone: [phone] Date 08/13/2022 Time 7:52 AM

## 2022-08-12 NOTE — Assessment & Plan Note (Signed)
Hx of low platelet count with noted clumping on previous CBC. Will repeat CBC today.

## 2022-08-12 NOTE — Assessment & Plan Note (Signed)
Monofilament testing today showed loss of sensation at few testing points on both feet. Patient describes his legs fall asleep frequently and at times pins and needles sensation. Given uncontrolled diabetes and findings for peripheral neuropathy, discussed with patient about starting a mediation for nerve pain. Patient agrees to starting it.   Plan -start gabapentin 100 mg TID

## 2022-08-12 NOTE — Assessment & Plan Note (Addendum)
Patient reports periodic bilateral LE pain after prolonged standing or walking. Has been going on for a while but noticed left leg at times would give out. He reports falling over due to this. Screening ABI performed in clinic today showed normal readings, Left 1.32 and Right 1.35. Exam showed varicose veins on both LE. Healed circular lesion above left lateral malleolus with little surrounding erythema. Discussed with patient about findings and possible arterial insufficiency and he may benefit from vascular referral.   Plan -referral to vascular surgery -continue to monitor left leg wound

## 2022-08-13 LAB — CBC
Hematocrit: 42.7 % (ref 37.5–51.0)
Hemoglobin: 15.1 g/dL (ref 13.0–17.7)
MCH: 31.9 pg (ref 26.6–33.0)
MCHC: 35.4 g/dL (ref 31.5–35.7)
MCV: 90 fL (ref 79–97)
RBC: 4.74 x10E6/uL (ref 4.14–5.80)
RDW: 13 % (ref 11.6–15.4)
WBC: 5.6 10*3/uL (ref 3.4–10.8)

## 2022-08-14 ENCOUNTER — Other Ambulatory Visit: Payer: Self-pay | Admitting: Student

## 2022-08-14 ENCOUNTER — Encounter: Payer: Self-pay | Admitting: Student

## 2022-08-19 ENCOUNTER — Encounter: Payer: Self-pay | Admitting: Vascular Surgery

## 2022-08-19 ENCOUNTER — Ambulatory Visit: Payer: Medicare HMO | Admitting: Vascular Surgery

## 2022-08-19 VITALS — BP 136/87 | HR 78 | Temp 98.3°F | Resp 16 | Ht 70.0 in | Wt 200.0 lb

## 2022-08-19 DIAGNOSIS — E1142 Type 2 diabetes mellitus with diabetic polyneuropathy: Secondary | ICD-10-CM | POA: Diagnosis not present

## 2022-08-19 NOTE — Progress Notes (Signed)
Patient ID: Riley Jones, male   DOB: 1956-08-25, 66 y.o.   MRN: [id number]  Reason for Consult: New Patient (Initial Visit) (Abnormal ABI  c/o bilateral feet becoming and painful)   Referred by Angelica Pou, MD  Subjective:     HPI:  Riley Jones is a 66 y.o. male without significant vascular history has had a rash on the left lateral aspect of his lower leg for approximately 1 year and has been followed by his primary care doctor.  States that he does have hyperlipidemia and diabetes.  More recently he has numbness of his feet he does wear diabetic shoes does not have any wounds on the feet and keeps very good control of his toenails.  Continues to work at [employer] in [place].  No history of stroke, TIA or amaurosis.  Does have known coronary artery disease.  He is a former smoker but quit earlier this year.  Denies any swelling of his leg.  Mother did have varicose vein stripping in the past.  Past Medical History:  Diagnosis Date   Anxiety    Arthritis    "pretty much all over"   Asthma    "mild" (07/17/2014)   Atypical angina    Myoview 2003, Normal EF 64%   CAD (coronary artery disease)    a. 10/20 DES to 80% mid LAD   Candida rash of groin 11/03/2019   COPD (chronic obstructive pulmonary disease) ([place])    "mild" (07/17/2014)   Depression    Fatigue 06/22/2019   Foot pain 03/30/2011   GERD (gastroesophageal reflux disease)    H/O hiatal hernia    Headache    "weekly" (06/2014)   History of gout    HIV (human immunodeficiency virus infection) ([place]) 1995   DX after shingles followed by Dr. Ola Spurr ([place])   Hx of cardiovascular stress test    ETT-Myoview (3/16):  Ex 10:15, No Ischemia, EF 57%; Normal Study   Hyperlipidemia    Hypertension    Well Controlled off meds   Increased anion gap metabolic acidosis 2/53/6644   Kidney stones    "passed them all"   Left lower quadrant abdominal pain 11/02/2019   Other and unspecified angina pectoris 12/18/2008   Overview:   Overview:  Greatly appreciate PCP f/u.   Last Assessment & Plan:  - Patient with episode of CP - 4 days, constant, substernal, not immediately resolved with NTG, felt better after burping and passing gas - Repeat EKG done here with no acute ST/T wave changes - CP now completely resolved. Encouraged patient to call 911 if recurs - To f/u with cardio in the next 2 weeks for further assess   Pneumonia 4-5 times   Rheumatoid arthritis(714.0)    Followed by Dr. Charlestine Night, off MTX and prdnisone since 2007   Right hip pain 04/25/2019   Right shoulder pain 01/19/2018   Shingles (herpes zoster) polyneuropathy 11/08/2018   Sleep apnea    does not wear mask (07/17/2014)   Type II diabetes mellitus ([place])    Family History  Problem Relation Age of Onset   Osteoporosis Mother    Diabetes Mother    Heart disease Father    Heart attack Father    Diabetes Father    COPD Father    Heart attack Brother    Stroke Maternal Grandmother    Stroke Maternal Grandfather    Stroke Paternal Grandmother    Stroke Paternal Grandfather    Past Surgical History:  Procedure  Laterality Date   APPENDECTOMY  ~ [place] Left 1990's   CORONARY ANGIOPLASTY WITH STENT PLACEMENT  07/17/2014   a. DES to 80% mid LAD   [place] / UMBILICAL / VENTRAL HERNIA REPAIR  1990's   "it was a double; not inguinal"   LEFT HEART CATHETERIZATION WITH CORONARY ANGIOGRAM N/A 07/17/2014   Procedure: LEFT HEART CATHETERIZATION WITH CORONARY ANGIOGRAM;  Surgeon: [name], MD;  Location: [facility] CATH LAB;  Service: Cardiovascular;  Laterality: N/A;   PERCUTANEOUS STENT INTERVENTION  07/17/2014   Procedure: PERCUTANEOUS STENT INTERVENTION;  Surgeon: [name], MD;  Location: [facility] CATH LAB;  Service: Cardiovascular;;   TONSILLECTOMY AND ADENOIDECTOMY  1990's   TRIGGER FINGER RELEASE Left 1990's   2nd & 5th digits   UMBILICAL HERNIA REPAIR  1980's   w/mesh   UVULOPALATOPHARYNGOPLASTY  (UPPP)/TONSILLECTOMY/SEPTOPLASTY  1990's    Short Social History:  Social History   Tobacco Use   Smoking status: Former    Packs/day: 1.00    Years: 46.00    Total pack years: 46.00    Types: Cigarettes    Quit date: 12/27/2021    Years since quitting: 0.6   Smokeless tobacco: Never  Substance Use Topics   Alcohol use: Not Currently    Alcohol/week: 1.0 standard drink of alcohol    Types: 1 Glasses of wine per week    Comment: 07/17/2014 "glass of wine once or twice/month"    Allergies  Allergen Reactions   Penicillins Hives, Nausea Only and Rash    Has patient had a PCN reaction causing immediate rash, facial/tongue/throat swelling, SOB or lightheadedness with hypotension: Yes Has patient had a PCN reaction causing severe rash involving mucus membranes or skin necrosis: No Has patient had a PCN reaction that required hospitalization: No Has patient had a PCN reaction occurring within the last 10 years: No If all of the above answers are "NO", then may proceed with Cephalosporin use.    Zoloft [Sertraline Hcl] Other (See Comments)    Patient reported Psychomotor slowing / worsened depression   Victoza [Liraglutide]     Did not tolerate this medication.  Complained of dizziness even at the lowest dose   Chantix [Varenicline] Hives   Gabapentin Itching   Lisinopril Cough   Ozempic (0.25 Or 0.5 Mg-Dose) [Semaglutide(0.25 Or 0.'5mg'$ -Dos)] Itching and Rash    Rash to chest wall.   [name] [Insulin Degludec] Rash    Current Outpatient Medications  Medication Sig Dispense Refill   albuterol (VENTOLIN HFA) 108 (90 Base) MCG/ACT inhaler Inhale 2 puffs into the lungs every 6 (six) hours as needed for wheezing or shortness of breath. 9 g 3   aspirin 81 MG EC tablet Take 81 mg by mouth daily.       atorvastatin (LIPITOR) 40 MG tablet Take 1 tablet (40 mg total) by mouth daily. 90 tablet 3   BD PEN NEEDLE NANO 2ND GEN 32G X 4 MM MISC USE UP TO 8 PER WEEK 100 each 1    bictegravir-emtricitabine-tenofovir AF (BIKTARVY) 50-200-25 MG TABS tablet Take 1 tablet by mouth daily. 30 tablet 11   carvedilol (COREG) 3.125 MG tablet Take 1 tablet (3.125 mg total) by mouth 2 (two) times daily with a meal. 180 tablet 1   clopidogrel (PLAVIX) 75 MG tablet Take 1 tablet (75 mg total) by mouth daily. 90 tablet 3   Clotrimazole 1 % OINT Apply topically. Clotrimazole and betamethasone diproprionate cream  USP 1%/0.05% (base)     Dermatological Products, Misc. (KERASAL FUNGAL NAIL RENEWAL EX) Apply topically.     empagliflozin (JARDIANCE) 25 MG TABS tablet Take 1 tablet (25 mg total) by mouth daily before breakfast. 90 tablet 1   fluticasone (FLONASE) 50 MCG/ACT nasal spray Place 1 spray into both nostrils daily. 16 g 0   HYDROcodone-acetaminophen (NORCO) 7.5-325 MG tablet Take 1 tablet by mouth every 6 (six) hours as needed for moderate pain. 60 tablet 0   hydrocortisone cream 1 % Apply 1 Application topically as needed for itching.     hydrOXYzine (ATARAX) 10 MG tablet TAKE 1 TABLET BY MOUTH THREE TIMES DAILY AS NEEDED FOR ITCHING 30 tablet 1   insulin glargine (LANTUS SOLOSTAR) 100 UNIT/ML Solostar Pen Inject 40 Units into the skin daily at 10 pm. 15 mL 3   loratadine (CLARITIN) 10 MG tablet Take 1 tablet (10 mg total) by mouth daily. 90 tablet 3   losartan-hydrochlorothiazide (HYZAAR) 50-12.5 MG tablet Take 1 tablet by mouth daily. 90 tablet 1   metFORMIN (GLUCOPHAGE-XR) 500 MG 24 hr tablet TAKE 2 TABLETS BY MOUTH TWICE A DAY WITH MEALS 180 tablet 3   Multiple Vitamins-Minerals (CENTRUM ADULTS PO) Take 1 tablet by mouth daily.     nitroGLYCERIN (NITROSTAT) 0.4 MG SL tablet PLACE 1 TABLET UNDER THE TONGUE EVERY 5 MINUTES AS NEEDED FOR CHEST PAIN 25 tablet 3   omeprazole (PRILOSEC) 20 MG capsule TAKE 1 CAPSULE BY MOUTH AS NEEDED 90 capsule 3   Semaglutide, 2 MG/DOSE, 8 MG/3ML SOPN Inject 2 mg into the skin once a week. 3 mL 3   tadalafil (CIALIS) 5 MG tablet Take 1 tablet (5 mg  total) by mouth daily. 90 tablet 0   terazosin (HYTRIN) 2 MG capsule Take 1 capsule (2 mg total) by mouth at bedtime. 90 capsule 3   No current facility-administered medications for this visit.    Review of Systems  Constitutional:  Constitutional negative. HENT: HENT negative.  Eyes: Eyes negative.  Cardiovascular: Cardiovascular negative.  GI: Gastrointestinal negative.  Musculoskeletal: Musculoskeletal negative.  Skin: Positive for rash.  Neurological: Positive for numbness.  Hematologic: Hematologic/lymphatic negative.  Psychiatric: Psychiatric negative.        Objective:  Objective   Vitals:   08/19/22 1410  BP: 136/87  Pulse: 78  Resp: 16  Temp: 98.3 F (36.8 C)  TempSrc: Temporal  SpO2: 94%  Weight: 200 lb (90.7 kg)  Height: '5\' 10"'$  (1.778 m)   Body mass index is 28.7 kg/m.  Physical Exam HENT:     Head: Normocephalic.     Nose: Nose normal.  Eyes:     Pupils: Pupils are equal, round, and reactive to light.  Cardiovascular:     Rate and Rhythm: Normal rate.     Pulses: Normal pulses.  Pulmonary:     Effort: Pulmonary effort is normal.  Abdominal:     General: Abdomen is flat.     Palpations: Abdomen is soft.  Musculoskeletal:     Cervical back: Normal range of motion and neck supple.     Right lower leg: No edema.     Left lower leg: No edema.     Comments: Veins on the left leg are very prominent when compressed and deflated the saphenous vein which is easily visible does refill with blood consistent with likely reflux  Skin:    Capillary Refill: Capillary refill takes less than 2 seconds.     Comments: There is a left  lateral leg rash without any underlying pigmentation of the skin  Neurological:     Mental Status: He is alert.  Psychiatric:        Mood and Affect: Mood normal.        Thought Content: Thought content normal.        Judgment: Judgment normal.     Data:      Assessment/Plan:    66 year old male with family history of  venous reflux now with a large greater saphenous vein that appears to reflux on physical exam and associated rash on the left lateral aspect of the leg but no pigmentation or swelling to suggest venous disease.  Given the length of time of the rash and the concern we will get him back for venous reflux testing we discussed the expected outcomes and need for compression stockings moving forward if he does not fact have reflux.  He demonstrates good understanding we will schedule him in a few weeks time.     Waynetta Sandy MD [facility]

## 2022-08-26 ENCOUNTER — Other Ambulatory Visit: Payer: Self-pay

## 2022-08-26 DIAGNOSIS — E1142 Type 2 diabetes mellitus with diabetic polyneuropathy: Secondary | ICD-10-CM

## 2022-08-27 ENCOUNTER — Encounter: Payer: Self-pay | Admitting: Internal Medicine

## 2022-08-29 NOTE — Progress Notes (Signed)
Internal Medicine Clinic Attending  I saw and evaluated the patient.  I personally confirmed the key portions of the history and exam documented by Dr. Zheng and I reviewed pertinent patient test results.  The assessment, diagnosis, and plan were formulated together and I agree with the documentation in the resident's note.  

## 2022-08-31 ENCOUNTER — Other Ambulatory Visit: Payer: Self-pay

## 2022-08-31 DIAGNOSIS — Z0000 Encounter for general adult medical examination without abnormal findings: Secondary | ICD-10-CM

## 2022-08-31 DIAGNOSIS — E11618 Type 2 diabetes mellitus with other diabetic arthropathy: Secondary | ICD-10-CM

## 2022-08-31 MED ORDER — LANTUS SOLOSTAR 100 UNIT/ML ~~LOC~~ SOPN: 40.0000 [IU] | PEN_INJECTOR | Freq: Every day | SUBCUTANEOUS | 3 refills | Status: DC

## 2022-08-31 NOTE — Assessment & Plan Note (Signed)
Lung Cancer screening CT done in July of this year with no suspicious nodules. - Will need annual screening given his smoking history

## 2022-09-01 DIAGNOSIS — E1165 Type 2 diabetes mellitus with hyperglycemia: Secondary | ICD-10-CM | POA: Diagnosis not present

## 2022-09-02 DIAGNOSIS — H3582 Retinal ischemia: Secondary | ICD-10-CM | POA: Diagnosis not present

## 2022-09-02 DIAGNOSIS — H353121 Nonexudative age-related macular degeneration, left eye, early dry stage: Secondary | ICD-10-CM | POA: Diagnosis not present

## 2022-09-02 DIAGNOSIS — H35432 Paving stone degeneration of retina, left eye: Secondary | ICD-10-CM | POA: Diagnosis not present

## 2022-09-02 DIAGNOSIS — H35363 Drusen (degenerative) of macula, bilateral: Secondary | ICD-10-CM | POA: Diagnosis not present

## 2022-09-02 DIAGNOSIS — E113211 Type 2 diabetes mellitus with mild nonproliferative diabetic retinopathy with macular edema, right eye: Secondary | ICD-10-CM | POA: Diagnosis not present

## 2022-09-02 LAB — HM DIABETES EYE EXAM

## 2022-09-05 ENCOUNTER — Other Ambulatory Visit: Payer: Self-pay | Admitting: Internal Medicine

## 2022-09-05 DIAGNOSIS — E11618 Type 2 diabetes mellitus with other diabetic arthropathy: Secondary | ICD-10-CM

## 2022-09-08 ENCOUNTER — Ambulatory Visit (INDEPENDENT_AMBULATORY_CARE_PROVIDER_SITE_OTHER): Payer: Medicare HMO | Admitting: Internal Medicine

## 2022-09-08 VITALS — BP 148/80 | HR 63 | Temp 97.4°F | Wt 202.8 lb

## 2022-09-08 DIAGNOSIS — N401 Enlarged prostate with lower urinary tract symptoms: Secondary | ICD-10-CM

## 2022-09-08 DIAGNOSIS — I10 Essential (primary) hypertension: Secondary | ICD-10-CM | POA: Diagnosis not present

## 2022-09-08 DIAGNOSIS — I739 Peripheral vascular disease, unspecified: Secondary | ICD-10-CM | POA: Diagnosis not present

## 2022-09-08 DIAGNOSIS — Z87891 Personal history of nicotine dependence: Secondary | ICD-10-CM

## 2022-09-08 DIAGNOSIS — R351 Nocturia: Secondary | ICD-10-CM | POA: Diagnosis not present

## 2022-09-08 NOTE — Assessment & Plan Note (Signed)
Patient presents to the [facility] for follow-up of claudication.  He continues to report periodic bilateral lower extremity pain after prolonged standing or walking.  Screening ABIs were performed in the clinic at the last visit and were normal with the left 1.32 and right 1.35.  On exam at that time, there was a healed circular lesion above the left lateral malleolus little surrounding erythema.  Today, that same lesion appears to be healing well and is slightly crusted over.  The patient saw the vascular surgeon and is getting further testing tomorrow with plans for possible intervention.  Plan: -Follow-up with vascular surgery as scheduled

## 2022-09-08 NOTE — Patient Instructions (Addendum)
Thank you, [name] for allowing [place] to provide your care today. Today we discussed:  Diabetes: Continue to take your meds as prescribed (no changes made today and not due for an A1c check)  High blood pressure: Keep taking coreg twice a day and losartan-HCTZ once a day. We may need to increase your losartan-HCTZ in the future if your blood pressure is still high  Vascular: Make sure you get the vascular test done tomorrow  Follow up with urology as scheduled  I have ordered the following labs for you:  Lab Orders  No laboratory test(s) ordered today      Referrals ordered today:   Referral Orders  No referral(s) requested today     I have ordered the following medication/changed the following medications:   Stop the following medications: There are no discontinued medications.   Start the following medications: No orders of the defined types were placed in this encounter.    Follow up: 2 months for diabetes   Should you have any questions or concerns please call the internal medicine clinic at [phone].     Buddy Duty, D.O. [place]

## 2022-09-08 NOTE — Progress Notes (Signed)
Internal Medicine Clinic Attending ° °Case discussed with Dr. Atway  At the time of the visit.  We reviewed the resident’s history and exam and pertinent patient test results.  I agree with the assessment, diagnosis, and plan of care documented in the resident’s note.  °

## 2022-09-08 NOTE — Progress Notes (Signed)
CC: claudication f/u  HPI:  [name] is a 66 y.o. male living with a history stated below and presents today for a follow up. Please see problem based assessment and plan for additional details.  Past Medical History:  Diagnosis Date   Anxiety    Arthritis    "pretty much all over"   Asthma    "mild" (07/17/2014)   Atypical angina    Myoview 2003, Normal EF 64%   CAD (coronary artery disease)    a. 10/20 DES to 80% mid LAD   Candida rash of groin 11/03/2019   COPD (chronic obstructive pulmonary disease) ([place])    "mild" (07/17/2014)   Depression    Fatigue 06/22/2019   Foot pain 03/30/2011   GERD (gastroesophageal reflux disease)    H/O hiatal hernia    Headache    "weekly" (06/2014)   History of gout    HIV (human immunodeficiency virus infection) ([place]) 1995   DX after shingles followed by Dr. Ola Spurr (ID)   Hx of cardiovascular stress test    ETT-Myoview (3/16):  Ex 10:15, No Ischemia, EF 57%; Normal Study   Hyperlipidemia    Hypertension    Well Controlled off meds   Increased anion gap metabolic acidosis 0/27/7412   Kidney stones    "passed them all"   Left lower quadrant abdominal pain 11/02/2019   Other and unspecified angina pectoris 12/18/2008   Overview:  Overview:  Greatly appreciate PCP f/u.   Last Assessment & Plan:  - Patient with episode of CP - 4 days, constant, substernal, not immediately resolved with NTG, felt better after burping and passing gas - Repeat EKG done here with no acute ST/T wave changes - CP now completely resolved. Encouraged patient to call 911 if recurs - To f/u with cardio in the next 2 weeks for further assess   Pneumonia 4-5 times   Rheumatoid arthritis(714.0)    Followed by Dr. Charlestine Night, off MTX and prdnisone since 2007   Right hip pain 04/25/2019   Right shoulder pain 01/19/2018   Shingles (herpes zoster) polyneuropathy 11/08/2018   Sleep apnea    does not wear mask (07/17/2014)   Type II diabetes mellitus ([place])      Current Outpatient Medications on File Prior to Visit  Medication Sig Dispense Refill   albuterol (VENTOLIN HFA) 108 (90 Base) MCG/ACT inhaler Inhale 2 puffs into the lungs every 6 (six) hours as needed for wheezing or shortness of breath. 9 g 3   aspirin 81 MG EC tablet Take 81 mg by mouth daily.       atorvastatin (LIPITOR) 40 MG tablet Take 1 tablet (40 mg total) by mouth daily. 90 tablet 3   BD PEN NEEDLE NANO 2ND GEN 32G X 4 MM MISC USE UP TO 8 PER WEEK 100 each 1   bictegravir-emtricitabine-tenofovir AF (BIKTARVY) 50-200-25 MG TABS tablet Take 1 tablet by mouth daily. 30 tablet 11   carvedilol (COREG) 3.125 MG tablet Take 1 tablet (3.125 mg total) by mouth 2 (two) times daily with a meal. 180 tablet 1   clopidogrel (PLAVIX) 75 MG tablet Take 1 tablet (75 mg total) by mouth daily. 90 tablet 3   Clotrimazole 1 % OINT Apply topically. Clotrimazole and betamethasone diproprionate cream USP 1%/0.05% (base)     Dermatological Products, Misc. (KERASAL FUNGAL NAIL RENEWAL EX) Apply topically.     empagliflozin (JARDIANCE) 25 MG TABS tablet Take 1 tablet (25 mg total) by mouth daily before breakfast. 90  tablet 1   fluticasone (FLONASE) 50 MCG/ACT nasal spray Place 1 spray into both nostrils daily. 16 g 0   HYDROcodone-acetaminophen (NORCO) 7.5-325 MG tablet Take 1 tablet by mouth every 6 (six) hours as needed for moderate pain. 60 tablet 0   hydrocortisone cream 1 % Apply 1 Application topically as needed for itching.     hydrOXYzine (ATARAX) 10 MG tablet TAKE 1 TABLET BY MOUTH THREE TIMES DAILY AS NEEDED FOR ITCHING 30 tablet 1   insulin glargine (LANTUS SOLOSTAR) 100 UNIT/ML Solostar Pen Inject 40 Units into the skin daily at 10 pm. 15 mL 3   loratadine (CLARITIN) 10 MG tablet Take 1 tablet (10 mg total) by mouth daily. 90 tablet 3   losartan-hydrochlorothiazide (HYZAAR) 50-12.5 MG tablet Take 1 tablet by mouth daily. 90 tablet 1   metFORMIN (GLUCOPHAGE-XR) 500 MG 24 hr tablet TAKE 2 TABLETS  BY MOUTH TWICE A DAY WITH MEALS 180 tablet 3   Multiple Vitamins-Minerals (CENTRUM ADULTS PO) Take 1 tablet by mouth daily.     nitroGLYCERIN (NITROSTAT) 0.4 MG SL tablet PLACE 1 TABLET UNDER THE TONGUE EVERY 5 MINUTES AS NEEDED FOR CHEST PAIN 25 tablet 3   omeprazole (PRILOSEC) 20 MG capsule TAKE 1 CAPSULE BY MOUTH AS NEEDED 90 capsule 3   Semaglutide, 2 MG/DOSE, (OZEMPIC, 2 MG/DOSE,) 8 MG/3ML SOPN INJECT 2 MG  SUBCUTANEOUSLY ONCE A WEEK 3 mL 3   tadalafil (CIALIS) 5 MG tablet Take 1 tablet (5 mg total) by mouth daily. 90 tablet 0   terazosin (HYTRIN) 2 MG capsule Take 1 capsule (2 mg total) by mouth at bedtime. 90 capsule 3   No current facility-administered medications on file prior to visit.    Family History  Problem Relation Age of Onset   Osteoporosis Mother    Diabetes Mother    Heart disease Father    Heart attack Father    Diabetes Father    COPD Father    Heart attack Brother    Stroke Maternal Grandmother    Stroke Maternal Grandfather    Stroke Paternal Grandmother    Stroke Paternal Grandfather     Social History   Socioeconomic History   Marital status: Single    Spouse name: Not on file   Number of children: Not on file   Years of education: 13   Highest education level: Not on file  Occupational History   Occupation: [employer]   Occupation: Administrator (retired)  Tobacco Use   Smoking status: Former    Packs/day: 1.00    Years: 46.00    Total pack years: 46.00    Types: Cigarettes    Quit date: 12/27/2021    Years since quitting: 0.6   Smokeless tobacco: Never  Vaping Use   Vaping Use: Never used  Substance and Sexual Activity   Alcohol use: Not Currently    Alcohol/week: 1.0 standard drink of alcohol    Types: 1 Glasses of wine per week    Comment: 07/17/2014 "glass of wine once or twice/month"   Drug use: No   Sexual activity: Yes    Partners: Male    Comment: declined condoms  Other Topics Concern   Not on file  Social History Narrative    [place] approved beginning 12/11/2009 - 12/27/2010   [employer] [place] benefits approved; patient eligible for 100% discount for out patient labs and office visits. Patient eligible for 70% discount for other services per Irish Elders 09/08/2010      Patient is  on disability, and walks 2-3 times per week.    Social Determinants of Health   Financial Resource Strain: Low Risk  (01/30/2022)   Overall Financial Resource Strain (CARDIA)    Difficulty of Paying Living Expenses: Not very hard  Food Insecurity: Food Insecurity Present (01/30/2022)   Hunger Vital Sign    Worried About Running Out of Food in the Last Year: Sometimes true    Ran Out of Food in the Last Year: Sometimes true  Transportation Needs: No Transportation Needs (05/27/2022)   PRAPARE - Hydrologist (Medical): No    Lack of Transportation (Non-Medical): No  Physical Activity: Sufficiently Active (01/30/2022)   Exercise Vital Sign    Days of Exercise per Week: 5 days    Minutes of Exercise per Session: 60 min  Stress: No Stress Concern Present (01/30/2022)   [place]    Feeling of Stress : Not at all  Social Connections: Socially Isolated (01/30/2022)   Social Connection and Isolation Panel [NHANES]    Frequency of Communication with Friends and Family: Once a week    Frequency of Social Gatherings with Friends and Family: More than three times a week    Attends Religious Services: Never    Marine scientist or Organizations: No    Attends Archivist Meetings: Never    Marital Status: Never married  Intimate Partner Violence: Not At Risk (01/30/2022)   Humiliation, Afraid, Rape, and Kick questionnaire    Fear of Current or Ex-Partner: No    Emotionally Abused: No    Physically Abused: No    Sexually Abused: No    Review of Systems: ROS negative except for what is noted on the assessment and plan.  Vitals:   09/08/22  1000 09/08/22 1036  BP: (!) 151/88 (!) 148/80  Pulse: 63 63  Temp: (!) 97.4 F (36.3 C)   TempSrc: Oral   SpO2: 100%   Weight: 202 lb 12.8 oz (92 kg)     Physical Exam: Constitutional: well-appearing elderly male sitting in chair, in no acute distress Cardiovascular: regular rate and rhythm, no m/r/g Pulmonary/Chest: normal work of breathing on room air, lungs clear to auscultation bilaterally Neurological: alert & oriented x 3, no focal deficit Skin: warm and dry, healing circular lesion above left lateral malleolus with no surrounding erythema, some dry skin overlying it. Psych: normal mood and behavior    Assessment & Plan:    Patient discussed with Dr. Cain Sieve  Essential hypertension Blood pressure is initially elevated to 151/88, but improved to 148/88 upon recheck.  Patient is on Coreg 3.125 mg twice daily and losartan-HCTZ 50-12.5 mg daily.  He denies any headaches, dizziness, chest pain, or dyspnea.  The patient was talking about his blood pressure is being checked and notes that while he was at the vascular surgeons office his blood pressure was 130/88.  In the future, if blood pressure remains elevated, can increase losartan-HCTZ.  Plan: -Continue Coreg 3.125 mg twice daily -Continue losartan-HCTZ 50-12.5 mg daily  Claudication [facility]) Patient presents to the [facility] for follow-up of claudication.  He continues to report periodic bilateral lower extremity pain after prolonged standing or walking.  Screening ABIs were performed in the clinic at the last visit and were normal with the left 1.32 and right 1.35.  On exam at that time, there was a healed circular lesion above the left lateral malleolus little surrounding erythema.  Today, that  same lesion appears to be healing well and is slightly crusted over.  The patient saw the vascular surgeon and is getting further testing tomorrow with plans for possible intervention.  Plan: -Follow-up with vascular surgery as  scheduled  BPH (benign prostatic hyperplasia) Patient has been taking tadalafil 5 mg daily and notes that he will be following up with urology at the end of this month.  He reports improvement in his nocturia with this medication.   Buddy Duty, D.O. [place], PGY-2 Phone: [phone] Date 09/08/2022 Time 10:41 AM

## 2022-09-08 NOTE — Assessment & Plan Note (Signed)
Blood pressure is initially elevated to 151/88, but improved to 148/88 upon recheck.  Patient is on Coreg 3.125 mg twice daily and losartan-HCTZ 50-12.5 mg daily.  He denies any headaches, dizziness, chest pain, or dyspnea.  The patient was talking about his blood pressure is being checked and notes that while he was at the vascular surgeons office his blood pressure was 130/88.  In the future, if blood pressure remains elevated, can increase losartan-HCTZ.  Plan: -Continue Coreg 3.125 mg twice daily -Continue losartan-HCTZ 50-12.5 mg daily

## 2022-09-08 NOTE — Assessment & Plan Note (Signed)
Patient has been taking tadalafil 5 mg daily and notes that he will be following up with urology at the end of this month.  He reports improvement in his nocturia with this medication.

## 2022-09-09 ENCOUNTER — Ambulatory Visit (HOSPITAL_COMMUNITY)
Admission: RE | Admit: 2022-09-09 | Discharge: 2022-09-09 | Disposition: A | Discharge status: Home / Self Care | Payer: Medicare HMO | Source: Ambulatory Visit | Attending: Vascular Surgery | Admitting: Vascular Surgery

## 2022-09-09 ENCOUNTER — Ambulatory Visit: Payer: Medicare HMO | Admitting: Physician Assistant

## 2022-09-09 VITALS — BP 141/76 | HR 79 | Temp 97.3°F | Ht 70.0 in | Wt 202.0 lb

## 2022-09-09 DIAGNOSIS — E1142 Type 2 diabetes mellitus with diabetic polyneuropathy: Secondary | ICD-10-CM | POA: Diagnosis not present

## 2022-09-09 DIAGNOSIS — R3912 Poor urinary stream: Secondary | ICD-10-CM | POA: Diagnosis not present

## 2022-09-09 DIAGNOSIS — N401 Enlarged prostate with lower urinary tract symptoms: Secondary | ICD-10-CM | POA: Diagnosis not present

## 2022-09-09 DIAGNOSIS — N5201 Erectile dysfunction due to arterial insufficiency: Secondary | ICD-10-CM | POA: Diagnosis not present

## 2022-09-09 DIAGNOSIS — I872 Venous insufficiency (chronic) (peripheral): Secondary | ICD-10-CM | POA: Diagnosis not present

## 2022-09-09 NOTE — Progress Notes (Signed)
Office Note     CC:  follow up Requesting Provider:  Aldine Contes, MD  HPI: Riley Jones is a 66 y.o. (31-Jan-1956) male who returns to the office for evaluation of varicose veins of left leg.  He was seen last month by Dr. Donzetta Matters for evaluation of a rash on his left lateral lower leg.  Arterial studies were normal however venous reflux study was ordered.  He has a family history of venous reflux.  He denies any edema of the left leg however does have varicose veins.  He denies any history of DVT, venous ulcerations, trauma, or prior vascular interventions.  He does not wear compression or make an effort to elevate his legs.  He works at [employer] and is on his feet for most of the day.   Past Medical History:  Diagnosis Date   Anxiety    Arthritis    "pretty much all over"   Asthma    "mild" (07/17/2014)   Atypical angina    Myoview 2003, Normal EF 64%   CAD (coronary artery disease)    a. 10/20 DES to 80% mid LAD   [place] rash of groin 11/03/2019   COPD (chronic obstructive pulmonary disease) ([place])    "mild" (07/17/2014)   Depression    Fatigue 06/22/2019   Foot pain 03/30/2011   GERD (gastroesophageal reflux disease)    H/O hiatal hernia    Headache    "weekly" (06/2014)   History of gout    HIV (human immunodeficiency virus infection) ([place]) 1995   DX after shingles followed by Dr. Ola Spurr (ID)   Hx of cardiovascular stress test    ETT-Myoview (3/16):  Ex 10:15, No Ischemia, EF 57%; Normal Study   Hyperlipidemia    Hypertension    Well Controlled off meds   Increased anion gap metabolic acidosis 0/86/7619   Kidney stones    "passed them all"   Left lower quadrant abdominal pain 11/02/2019   Other and unspecified angina pectoris 12/18/2008   Overview:  Overview:  Greatly appreciate PCP f/u.   Last Assessment & Plan:  - Patient with episode of CP - 4 days, constant, substernal, not immediately resolved with NTG, felt better after burping and passing gas - Repeat EKG done here  with no acute ST/T wave changes - CP now completely resolved. Encouraged patient to call 911 if recurs - To f/u with cardio in the next 2 weeks for further assess   Pneumonia 4-5 times   Rheumatoid arthritis(714.0)    Followed by Dr. Charlestine Night, off MTX and prdnisone since 2007   Right hip pain 04/25/2019   Right shoulder pain 01/19/2018   Shingles (herpes zoster) polyneuropathy 11/08/2018   Sleep apnea    does not wear mask (07/17/2014)   Type II diabetes mellitus ([place])     Past Surgical History:  Procedure Laterality Date   APPENDECTOMY  ~ [place] Left 1990's   CORONARY ANGIOPLASTY WITH STENT PLACEMENT  07/17/2014   a. DES to 80% mid LAD   [place] / UMBILICAL / VENTRAL HERNIA REPAIR  1990's   "it was a double; not inguinal"   LEFT HEART CATHETERIZATION WITH CORONARY ANGIOGRAM N/A 07/17/2014   Procedure: LEFT HEART CATHETERIZATION WITH CORONARY ANGIOGRAM;  Surgeon: [name], MD;  Location: [facility] CATH LAB;  Service: Cardiovascular;  Laterality: N/A;   PERCUTANEOUS STENT INTERVENTION  07/17/2014   Procedure: PERCUTANEOUS STENT INTERVENTION;  Surgeon: Wallis Bamberg  Johnsie Cancel, MD;  Location: [facility] CATH LAB;  Service: Cardiovascular;;   TONSILLECTOMY AND ADENOIDECTOMY  1990's   TRIGGER FINGER RELEASE Left 1990's   2nd & 5th digits   UMBILICAL HERNIA REPAIR  1980's   w/mesh   UVULOPALATOPHARYNGOPLASTY (UPPP)/TONSILLECTOMY/SEPTOPLASTY  1990's    Social History   Socioeconomic History   Marital status: Single    Spouse name: Not on file   Number of children: Not on file   Years of education: 13   Highest education level: Not on file  Occupational History   Occupation: [employer]   Occupation: Administrator (retired)  Tobacco Use   Smoking status: Former    Packs/day: 1.00    Years: 46.00    Total pack years: 46.00    Types: Cigarettes    Quit date: 12/27/2021    Years since quitting: 0.7   Smokeless tobacco: Never  Vaping Use   Vaping Use:  Never used  Substance and Sexual Activity   Alcohol use: Not Currently    Alcohol/week: 1.0 standard drink of alcohol    Types: 1 Glasses of wine per week    Comment: 07/17/2014 "glass of wine once or twice/month"   Drug use: No   Sexual activity: Yes    Partners: Male    Comment: declined condoms  Other Topics Concern   Not on file  Social History Narrative   [place] approved beginning 12/11/2009 - 12/27/2010   [place] [place] benefits approved; patient eligible for 100% discount for out patient labs and office visits. Patient eligible for 70% discount for other services per Irish Elders 09/08/2010      Patient is on disability, and walks 2-3 times per week.    Social Determinants of Health   Financial Resource Strain: Low Risk  (01/30/2022)   Overall Financial Resource Strain (CARDIA)    Difficulty of Paying Living Expenses: Not very hard  Food Insecurity: Food Insecurity Present (01/30/2022)   Hunger Vital Sign    Worried About Running Out of Food in the Last Year: Sometimes true    Ran Out of Food in the Last Year: Sometimes true  Transportation Needs: No Transportation Needs (05/27/2022)   PRAPARE - Hydrologist (Medical): No    Lack of Transportation (Non-Medical): No  Physical Activity: Sufficiently Active (01/30/2022)   Exercise Vital Sign    Days of Exercise per Week: 5 days    Minutes of Exercise per Session: 60 min  Stress: No Stress Concern Present (01/30/2022)   [place]    Feeling of Stress : Not at all  Social Connections: Socially Isolated (01/30/2022)   Social Connection and Isolation Panel [NHANES]    Frequency of Communication with Friends and Family: Once a week    Frequency of Social Gatherings with Friends and Family: More than three times a week    Attends Religious Services: Never    Marine scientist or Organizations: No    Attends Archivist Meetings:  Never    Marital Status: Never married  Intimate Partner Violence: Not At Risk (01/30/2022)   Humiliation, Afraid, Rape, and Kick questionnaire    Fear of Current or Ex-Partner: No    Emotionally Abused: No    Physically Abused: No    Sexually Abused: No    Family History  Problem Relation Age of Onset   Osteoporosis Mother    Diabetes Mother    Heart disease Father  Heart attack Father    Diabetes Father    COPD Father    Heart attack Brother    Stroke Maternal Grandmother    Stroke Maternal Grandfather    Stroke Paternal Grandmother    Stroke Paternal Grandfather     Current Outpatient Medications  Medication Sig Dispense Refill   albuterol (VENTOLIN HFA) 108 (90 Base) MCG/ACT inhaler Inhale 2 puffs into the lungs every 6 (six) hours as needed for wheezing or shortness of breath. 9 g 3   aspirin 81 MG EC tablet Take 81 mg by mouth daily.       atorvastatin (LIPITOR) 40 MG tablet Take 1 tablet (40 mg total) by mouth daily. 90 tablet 3   BD PEN NEEDLE NANO 2ND GEN 32G X 4 MM MISC USE UP TO 8 PER WEEK 100 each 1   bictegravir-emtricitabine-tenofovir AF (BIKTARVY) 50-200-25 MG TABS tablet Take 1 tablet by mouth daily. 30 tablet 11   carvedilol (COREG) 3.125 MG tablet Take 1 tablet (3.125 mg total) by mouth 2 (two) times daily with a meal. 180 tablet 1   clopidogrel (PLAVIX) 75 MG tablet Take 1 tablet (75 mg total) by mouth daily. 90 tablet 3   Clotrimazole 1 % OINT Apply topically. Clotrimazole and betamethasone diproprionate cream USP 1%/0.05% (base)     Dermatological Products, Misc. (KERASAL FUNGAL NAIL RENEWAL EX) Apply topically.     empagliflozin (JARDIANCE) 25 MG TABS tablet Take 1 tablet (25 mg total) by mouth daily before breakfast. 90 tablet 1   fluticasone (FLONASE) 50 MCG/ACT nasal spray Place 1 spray into both nostrils daily. 16 g 0   HYDROcodone-acetaminophen (NORCO) 7.5-325 MG tablet Take 1 tablet by mouth every 6 (six) hours as needed for moderate pain. 60 tablet 0    hydrocortisone cream 1 % Apply 1 Application topically as needed for itching.     hydrOXYzine (ATARAX) 10 MG tablet TAKE 1 TABLET BY MOUTH THREE TIMES DAILY AS NEEDED FOR ITCHING 30 tablet 1   insulin glargine (LANTUS SOLOSTAR) 100 UNIT/ML Solostar Pen Inject 40 Units into the skin daily at 10 pm. 15 mL 3   loratadine (CLARITIN) 10 MG tablet Take 1 tablet (10 mg total) by mouth daily. 90 tablet 3   losartan-hydrochlorothiazide (HYZAAR) 50-12.5 MG tablet Take 1 tablet by mouth daily. 90 tablet 1   metFORMIN (GLUCOPHAGE-XR) 500 MG 24 hr tablet TAKE 2 TABLETS BY MOUTH TWICE A DAY WITH MEALS 180 tablet 3   Multiple Vitamins-Minerals (CENTRUM ADULTS PO) Take 1 tablet by mouth daily.     nitroGLYCERIN (NITROSTAT) 0.4 MG SL tablet PLACE 1 TABLET UNDER THE TONGUE EVERY 5 MINUTES AS NEEDED FOR CHEST PAIN 25 tablet 3   omeprazole (PRILOSEC) 20 MG capsule TAKE 1 CAPSULE BY MOUTH AS NEEDED 90 capsule 3   Semaglutide, 2 MG/DOSE, (OZEMPIC, 2 MG/DOSE,) 8 MG/3ML SOPN INJECT 2 MG  SUBCUTANEOUSLY ONCE A WEEK 3 mL 3   tadalafil (CIALIS) 5 MG tablet Take 1 tablet (5 mg total) by mouth daily. 90 tablet 0   terazosin (HYTRIN) 2 MG capsule Take 1 capsule (2 mg total) by mouth at bedtime. 90 capsule 3   No current facility-administered medications for this visit.    Allergies  Allergen Reactions   Penicillins Hives, Nausea Only and Rash    Has patient had a PCN reaction causing immediate rash, facial/tongue/throat swelling, SOB or lightheadedness with hypotension: Yes Has patient had a PCN reaction causing severe rash involving mucus membranes or skin necrosis: No Has  patient had a PCN reaction that required hospitalization: No Has patient had a PCN reaction occurring within the last 10 years: No If all of the above answers are "NO", then may proceed with Cephalosporin use.    Zoloft [Sertraline Hcl] Other (See Comments)    Patient reported Psychomotor slowing / worsened depression   Victoza [Liraglutide]      Did not tolerate this medication.  Complained of dizziness even at the lowest dose   Chantix [Varenicline] Hives   Gabapentin Itching   Lisinopril Cough   Ozempic (0.25 Or 0.5 Mg-Dose) [Semaglutide(0.25 Or 0.'5mg'$ -Dos)] Itching and Rash    Rash to chest wall.   [name] [Insulin Degludec] Rash     REVIEW OF SYSTEMS:   '[X]'$  denotes positive finding, '[ ]'$  denotes negative finding Cardiac  Comments:  Chest pain or chest pressure:    Shortness of breath upon exertion:    Short of breath when lying flat:    Irregular heart rhythm:        Vascular    Pain in calf, thigh, or hip brought on by ambulation:    Pain in feet at night that wakes you up from your sleep:     Blood clot in your veins:    Leg swelling:         Pulmonary    Oxygen at home:    Productive cough:     Wheezing:         Neurologic    Sudden weakness in arms or legs:     Sudden numbness in arms or legs:     Sudden onset of difficulty speaking or slurred speech:    Temporary loss of vision in one eye:     Problems with dizziness:         Gastrointestinal    Blood in stool:     Vomited blood:         Genitourinary    Burning when urinating:     Blood in urine:        Psychiatric    Major depression:         Hematologic    Bleeding problems:    Problems with blood clotting too easily:        Skin    Rashes or ulcers:        Constitutional    Fever or chills:      PHYSICAL EXAMINATION:  Vitals:   09/09/22 1415  BP: (!) 141/76  Pulse: 79  Temp: (!) 97.3 F (36.3 C)  TempSrc: Oral  SpO2: 98%  Weight: 202 lb (91.6 kg)  Height: '5\' 10"'$  (1.778 m)    General:  WDWN in NAD; vital signs documented above Gait: Not observed HENT: WNL, normocephalic Pulmonary: normal non-labored breathing , without Rales, rhonchi,  wheezing Cardiac: regular HR Abdomen: soft, NT, no masses Skin: without rashes Extremities: Feet are warm and well-perfused.  Some varicosities of the left lower leg; left lateral lower  leg rash; no pigmentation changes Musculoskeletal: no muscle wasting or atrophy  Neurologic: A&O X 3;  No focal weakness or paresthesias are detected Psychiatric:  The pt has Normal affect.   Non-Invasive Vascular Imaging:   Left lower extremity venous reflux study was negative for DVT.  He has an incompetent common femoral vein Greater saphenous vein is incompetent at the saphenofemoral junction and mid thigh however diameter is 2 mm or less throughout the thigh  Small saphenous vein is large however does not have reflux  ASSESSMENT/PLAN:: 66 y.o. male with left lower leg rash  -Patient returns to clinic for formal evaluation of venous circulation.  Left lower extremity venous reflux study was negative for DVT.  He has mild deep and superficial venous reflux however would not be a candidate for laser ablation therapy.  I would still recommend 15 to 20 mmHg knee-high compression socks to be worn regularly during the day.  He should make an effort to elevate his legs when he gets home from work.  He should do his best to avoid prolonged sitting and standing while at work.  Continue to moisturize the left lower leg especially in the winter months.  No utility in repeating venous reflux study unless his physical exam worsened.  He will follow-up on an as-needed basis.   Dagoberto Ligas, PA-C [facility] [phone]  Clinic MD:   Donzetta Matters

## 2022-09-10 DIAGNOSIS — I8393 Asymptomatic varicose veins of bilateral lower extremities: Secondary | ICD-10-CM

## 2022-09-14 ENCOUNTER — Encounter: Payer: Self-pay | Admitting: *Deleted

## 2022-09-25 ENCOUNTER — Encounter: Payer: Self-pay | Admitting: Internal Medicine

## 2022-09-27 ENCOUNTER — Other Ambulatory Visit: Payer: Self-pay | Admitting: Internal Medicine

## 2022-09-27 DIAGNOSIS — Z79891 Long term (current) use of opiate analgesic: Secondary | ICD-10-CM

## 2022-09-28 DIAGNOSIS — L409 Psoriasis, unspecified: Secondary | ICD-10-CM

## 2022-09-28 HISTORY — DX: Psoriasis, unspecified: L40.9

## 2022-09-29 NOTE — Telephone Encounter (Signed)
I called to f/u on his symptoms but no answer; left message to call the office.

## 2022-10-01 MED ORDER — HYDROCODONE-ACETAMINOPHEN 7.5-325 MG PO TABS: 1.0000 | ORAL_TABLET | Freq: Two times a day (BID) | ORAL | 0 refills | Status: DC | PRN

## 2022-10-14 ENCOUNTER — Other Ambulatory Visit: Payer: Self-pay | Admitting: Internal Medicine

## 2022-10-14 DIAGNOSIS — E291 Testicular hypofunction: Secondary | ICD-10-CM | POA: Diagnosis not present

## 2022-10-16 NOTE — Telephone Encounter (Signed)
Next appt scheduled 11/12/22 with Dr Heber Rhame.

## 2022-10-19 DIAGNOSIS — Z6828 Body mass index (BMI) 28.0-28.9, adult: Secondary | ICD-10-CM | POA: Diagnosis not present

## 2022-10-19 DIAGNOSIS — J0100 Acute maxillary sinusitis, unspecified: Secondary | ICD-10-CM | POA: Diagnosis not present

## 2022-10-22 ENCOUNTER — Telehealth: Payer: Self-pay

## 2022-10-22 NOTE — Telephone Encounter (Signed)
[facility] Patient Advocate Encounter   I was successful in securing patient a $ 10,000.00 grant from [employer] to provide copayment coverage for Biktarvy.  The patient's out of pocket cost will be $5.00 monthly.     I have spoken with the patient.          Dates of Eligibility: 10/22/22 through 09/28/23  Patient knows to call the office with questions or concerns.  Ileene Patrick, [facility] Patient [facility] Phone: [phone] Fax:  [phone]

## 2022-11-12 ENCOUNTER — Ambulatory Visit (INDEPENDENT_AMBULATORY_CARE_PROVIDER_SITE_OTHER): Payer: Medicare HMO | Admitting: Internal Medicine

## 2022-11-12 ENCOUNTER — Encounter: Payer: Self-pay | Admitting: Internal Medicine

## 2022-11-12 ENCOUNTER — Other Ambulatory Visit: Payer: Self-pay

## 2022-11-12 VITALS — BP 127/74 | HR 78 | Temp 97.9°F | Ht 70.0 in | Wt 199.7 lb

## 2022-11-12 DIAGNOSIS — M05741 Rheumatoid arthritis with rheumatoid factor of right hand without organ or systems involvement: Secondary | ICD-10-CM

## 2022-11-12 DIAGNOSIS — I700 Atherosclerosis of aorta: Secondary | ICD-10-CM

## 2022-11-12 DIAGNOSIS — E1142 Type 2 diabetes mellitus with diabetic polyneuropathy: Secondary | ICD-10-CM | POA: Diagnosis not present

## 2022-11-12 DIAGNOSIS — Z79891 Long term (current) use of opiate analgesic: Secondary | ICD-10-CM

## 2022-11-12 DIAGNOSIS — B20 Human immunodeficiency virus [HIV] disease: Secondary | ICD-10-CM | POA: Diagnosis not present

## 2022-11-12 DIAGNOSIS — I2510 Atherosclerotic heart disease of native coronary artery without angina pectoris: Secondary | ICD-10-CM | POA: Diagnosis not present

## 2022-11-12 DIAGNOSIS — I10 Essential (primary) hypertension: Secondary | ICD-10-CM

## 2022-11-12 DIAGNOSIS — M05742 Rheumatoid arthritis with rheumatoid factor of left hand without organ or systems involvement: Secondary | ICD-10-CM | POA: Diagnosis not present

## 2022-11-12 DIAGNOSIS — D696 Thrombocytopenia, unspecified: Secondary | ICD-10-CM

## 2022-11-12 LAB — GLUCOSE, CAPILLARY: Glucose-Capillary: 133 mg/dL — ABNORMAL HIGH (ref 70–99)

## 2022-11-12 LAB — POCT GLYCOSYLATED HEMOGLOBIN (HGB A1C): Hemoglobin A1C: 9.2 % — AB (ref 4.0–5.6)

## 2022-11-12 MED ORDER — CARVEDILOL 3.125 MG PO TABS: 3.1250 mg | ORAL_TABLET | Freq: Two times a day (BID) | ORAL | 3 refills | Status: DC

## 2022-11-12 MED ORDER — HYDROCODONE-ACETAMINOPHEN 7.5-325 MG PO TABS: 1.0000 | ORAL_TABLET | Freq: Two times a day (BID) | ORAL | 0 refills | Status: DC | PRN

## 2022-11-12 MED ORDER — SYNJARDY XR 12.5-1000 MG PO TB24: 1.0000 | ORAL_TABLET | Freq: Two times a day (BID) | ORAL | 3 refills | Status: DC

## 2022-11-12 MED ORDER — PEN NEEDLES 32G X 6 MM MISC: 1.0000 [IU] | Freq: Every day | 11 refills | Status: DC

## 2022-11-12 MED ORDER — LOSARTAN POTASSIUM-HCTZ 50-12.5 MG PO TABS: 1.0000 | ORAL_TABLET | Freq: Every day | ORAL | 3 refills | Status: DC

## 2022-11-12 NOTE — Assessment & Plan Note (Signed)
Riley Jones wore the CGM for 14 days. The average reading was 203, % time in target was 35, % time below target was 0, and % time above target was. 65. Intervention will be to increase basal insulin.   A1c has worsened and is uncontrolled.  He is on max dose of Jardiance and Ozempic I have encouraged him to continue these 2.  Will see if he can have Jardiance and his metformin combined for easier administration.  Given he is consistently high on his a.m. readings I have instructed him to begin increasing his lantus by 2 units every 3 to 5 days until his a.m. sugars are below 140 without any hypoglycemia max dose for now will be 60 units.

## 2022-11-12 NOTE — Patient Instructions (Addendum)
I want you to increase your lantus by 2 units every 3-5 days until you are seeing morning sugars less than 140 without any low blood sugars.  Do not go above 60 units.  Try using just Clotrimazole 1% cream in your groin. (Can get OTC)

## 2022-11-12 NOTE — Assessment & Plan Note (Signed)
Stable continue dual antiplatelet and lipid-lowering therapy.

## 2022-11-12 NOTE — Assessment & Plan Note (Signed)
Looks like he was previously diagnosed with rheumatoid arthritis he was following with [facility] clinic rheumatology although his previous rheumatologist has since retired he was last seen in 2022.  Per notes it looks like he was previously on methotrexate leflunomide and then Plaquenil.  Plaquenil was reportedly discontinued due to retinopathy.  To my knowledge she is not currently on treatment.

## 2022-11-12 NOTE — Assessment & Plan Note (Signed)
Blood pressure at goal continue carvedilol 3.125 twice daily continue losartan-HCTZ 50-12.5 mg daily

## 2022-11-12 NOTE — Assessment & Plan Note (Signed)
Aortic atherosclerosis noted on previous low-dose CT chest imaging he is on appropriate therapy with statin and ASCVD reducing diabetic medications.

## 2022-11-12 NOTE — Assessment & Plan Note (Signed)
Looks like he has a history of thrombocytopenia last few CBCs have showed thrombocytopenia however platelet clumping so really unable to quantify if this is real or artifactual.

## 2022-11-12 NOTE — Assessment & Plan Note (Signed)
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 Johnnye Sima.

## 2022-11-12 NOTE — Progress Notes (Signed)
Established Patient Office Visit  Subjective   Patient ID: Riley Jones, male    DOB: 1955/12/15  Age: 67 y.o. MRN: [id number]  Chief Complaint  Patient presents with   Meet new PCP   Medication Refill   Riley Jones presents today to follow-up multiple chronic conditions including hypertension diabetes.  I have recently been reassigned as his primary care physician as well.  His blood pressure is well-controlled on carvedilol and losartan HCTZ combination pill.  He denies any chest pain he has been adherent to 81 mg of aspirin along with Plavix and 40 mg of atorvastatin.  He was surprised to learn his A1c has worsened.  However we did download his cgm.  He is on a medication regimen I agree with that consists of metformin and Jardiance Ozempic and Lantus.  He works at [employer] and occasionally works odd hours.  This does play a role into his bad habits with eating.  He has had no hypoglycemia.  He notes that he does not inject insulin into his abdomen is much and more preferring his inner thighs he feels that sometimes the insulin leaks out that his needle is not as long as it previously was (thinks he may benefit from longer pen needle).  He does report to me that he has recurrent issue with itching in his groin he is intermittently used clotrimazole-betamethasone cream for which tends to resolve it and then it returns.  He is switched to boxers without noticeable benefit.    Objective:     BP 127/74 (BP Location: Right Arm, Patient Position: Sitting, Cuff Size: Normal)   Pulse 78   Temp 97.9 F (36.6 C) (Oral)   Ht 5' 10"$  (1.778 m)   Wt 199 lb 11.2 oz (90.6 kg)   SpO2 98% Comment: RA  BMI 28.65 kg/m  BP Readings from Last 3 Encounters:  11/12/22 127/74  09/09/22 (!) 141/76  09/08/22 (!) 148/80   Wt Readings from Last 3 Encounters:  11/12/22 199 lb 11.2 oz (90.6 kg)  09/09/22 202 lb (91.6 kg)  09/08/22 202 lb 12.8 oz (92 kg)      Physical Exam Constitutional:      Appearance:  Normal appearance. He is obese.  Abdominal:     General: Abdomen is flat. Bowel sounds are normal. There is no distension.     Palpations: Abdomen is soft.     Tenderness: There is no abdominal tenderness.  Neurological:     Mental Status: He is alert.      Results for orders placed or performed in visit on 11/12/22  Glucose, capillary  Result Value Ref Range   Glucose-Capillary 133 (H) 70 - 99 mg/dL  POC Hbg A1C  Result Value Ref Range   Hemoglobin A1C 9.2 (A) 4.0 - 5.6 %   HbA1c POC (<> result, manual entry)     HbA1c, POC (prediabetic range)     HbA1c, POC (controlled diabetic range)      Last CBC Lab Results  Component Value Date   WBC 5.6 08/12/2022   HGB 15.1 08/12/2022   HCT 42.7 08/12/2022   MCV 90 08/12/2022   MCH 31.9 08/12/2022   RDW 13.0 08/12/2022   PLT CANCELED 99991111   Last metabolic panel Lab Results  Component Value Date   GLUCOSE 170 (H) 05/26/2022   NA 141 05/26/2022   K 4.6 05/26/2022   CL 105 05/26/2022   CO2 26 05/26/2022   BUN 14 05/26/2022   CREATININE 0.94 05/26/2022  EGFR 89 05/26/2022   CALCIUM 10.3 05/26/2022   PHOS 3.5 10/07/2011   PROT 6.9 05/26/2022   ALBUMIN 4.4 01/15/2020   BILITOT 0.6 05/26/2022   ALKPHOS 90 01/15/2020   AST 40 (H) 05/26/2022   ALT 51 (H) 05/26/2022   ANIONGAP 8 02/28/2017   Last lipids Lab Results  Component Value Date   CHOL 142 05/26/2022   HDL 45 05/26/2022   LDLCALC 69 05/26/2022   TRIG 212 (H) 05/26/2022   CHOLHDL 3.2 05/26/2022   Last hemoglobin A1c Lab Results  Component Value Date   HGBA1C 9.2 (A) 11/12/2022      The 10-year ASCVD risk score (Arnett DK, et al., 2019) is: 34.8%    Assessment & Plan:   Problem List Items Addressed This Visit       Cardiovascular and Mediastinum   Essential hypertension - Primary (Chronic)    Blood pressure at goal continue carvedilol 3.125 twice daily continue losartan-HCTZ 50-12.5 mg daily      Relevant Medications    losartan-hydrochlorothiazide (HYZAAR) 50-12.5 MG tablet   carvedilol (COREG) 3.125 MG tablet   CAD (coronary artery disease) (Chronic)    Stable continue dual antiplatelet and lipid-lowering therapy.      Relevant Medications   losartan-hydrochlorothiazide (HYZAAR) 50-12.5 MG tablet   carvedilol (COREG) 3.125 MG tablet   Aortic atherosclerosis (HCC)    Aortic atherosclerosis noted on previous low-dose CT chest imaging he is on appropriate therapy with statin and ASCVD reducing diabetic medications.      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 The average reading was 203, % time in target was 35, % time below target was 0, and % time above target was. 65. Intervention will be to increase basal insulin.   A1c has worsened and is uncontrolled.  He is on max dose of Jardiance and Ozempic I have encouraged him to continue these 2.  Will see if he can have Jardiance and his metformin combined for easier administration.  Given he is consistently high on his a.m. readings I have instructed him to begin increasing his lantus by 2 units every 3 to 5 days until his a.m. sugars are below 140 without any hypoglycemia max dose for now will be 60 units.      Relevant Medications   Empagliflozin-metFORMIN HCl ER (SYNJARDY XR) 12.01-999 MG TB24   losartan-hydrochlorothiazide (HYZAAR) 50-12.5 MG tablet     Musculoskeletal and Integument   Rheumatoid arthritis ([place])    Looks like he was previously diagnosed with rheumatoid arthritis he was following with [facility] clinic rheumatology although his previous rheumatologist has since retired he was last seen in 2022.  Per notes it looks like he was previously on methotrexate leflunomide and then Plaquenil.  Plaquenil was reportedly discontinued due to  retinopathy.  To my knowledge she is not currently on treatment.      Relevant Medications   HYDROcodone-acetaminophen (NORCO) 7.5-325 MG tablet   HYDROcodone-acetaminophen (NORCO) 7.5-325 MG tablet (Start on 12/12/2022)   HYDROcodone-acetaminophen (NORCO) 7.5-325 MG tablet (Start on 01/11/2023)     Other   Human immunodeficiency virus (HIV) disease ([place]) (Chronic)    Compliant with Biktarvy reports he has had undetectable viral load for over 20 years follows with Dr. Johnnye Sima.      Low platelet  count ([place])    Looks like he has a history of thrombocytopenia last few CBCs have showed thrombocytopenia however platelet clumping so really unable to quantify if this is real or artifactual.      Chronic prescription opiate use   Relevant Medications   HYDROcodone-acetaminophen (NORCO) 7.5-325 MG tablet   HYDROcodone-acetaminophen (NORCO) 7.5-325 MG tablet (Start on 12/12/2022)   HYDROcodone-acetaminophen ([place]) 7.5-325 MG tablet (Start on 01/11/2023)   Other Visit Diagnoses     Diabetic peripheral neuropathy ([place])       Relevant Medications   Empagliflozin-metFORMIN HCl ER (SYNJARDY XR) 12.01-999 MG TB24   losartan-hydrochlorothiazide (HYZAAR) 50-12.5 MG tablet   Other Relevant Orders   POC Hbg A1C (Completed)   Ambulatory referral to Podiatry       Return in about 3 months (around 02/10/2023).    Lucious Groves, DO

## 2022-11-19 DIAGNOSIS — H02423 Myogenic ptosis of bilateral eyelids: Secondary | ICD-10-CM | POA: Diagnosis not present

## 2022-11-19 DIAGNOSIS — H57813 Brow ptosis, bilateral: Secondary | ICD-10-CM | POA: Diagnosis not present

## 2022-11-19 DIAGNOSIS — H0279 Other degenerative disorders of eyelid and periocular area: Secondary | ICD-10-CM | POA: Diagnosis not present

## 2022-11-19 DIAGNOSIS — H02831 Dermatochalasis of right upper eyelid: Secondary | ICD-10-CM | POA: Diagnosis not present

## 2022-11-19 DIAGNOSIS — H53483 Generalized contraction of visual field, bilateral: Secondary | ICD-10-CM | POA: Diagnosis not present

## 2022-11-19 DIAGNOSIS — H0289 Other specified disorders of eyelid: Secondary | ICD-10-CM | POA: Diagnosis not present

## 2022-11-19 DIAGNOSIS — H02413 Mechanical ptosis of bilateral eyelids: Secondary | ICD-10-CM | POA: Diagnosis not present

## 2022-11-19 DIAGNOSIS — H02834 Dermatochalasis of left upper eyelid: Secondary | ICD-10-CM | POA: Diagnosis not present

## 2022-11-30 DIAGNOSIS — M792 Neuralgia and neuritis, unspecified: Secondary | ICD-10-CM | POA: Diagnosis not present

## 2022-11-30 DIAGNOSIS — M205X1 Other deformities of toe(s) (acquired), right foot: Secondary | ICD-10-CM | POA: Diagnosis not present

## 2022-11-30 DIAGNOSIS — B351 Tinea unguium: Secondary | ICD-10-CM | POA: Diagnosis not present

## 2022-11-30 DIAGNOSIS — M205X2 Other deformities of toe(s) (acquired), left foot: Secondary | ICD-10-CM | POA: Diagnosis not present

## 2022-11-30 DIAGNOSIS — L989 Disorder of the skin and subcutaneous tissue, unspecified: Secondary | ICD-10-CM | POA: Diagnosis not present

## 2022-11-30 DIAGNOSIS — E1142 Type 2 diabetes mellitus with diabetic polyneuropathy: Secondary | ICD-10-CM | POA: Diagnosis not present

## 2022-12-07 DIAGNOSIS — R3912 Poor urinary stream: Secondary | ICD-10-CM | POA: Diagnosis not present

## 2022-12-07 DIAGNOSIS — E291 Testicular hypofunction: Secondary | ICD-10-CM | POA: Diagnosis not present

## 2022-12-07 DIAGNOSIS — N401 Enlarged prostate with lower urinary tract symptoms: Secondary | ICD-10-CM | POA: Diagnosis not present

## 2022-12-07 DIAGNOSIS — N5201 Erectile dysfunction due to arterial insufficiency: Secondary | ICD-10-CM | POA: Diagnosis not present

## 2022-12-08 DIAGNOSIS — E1165 Type 2 diabetes mellitus with hyperglycemia: Secondary | ICD-10-CM | POA: Diagnosis not present

## 2022-12-08 DIAGNOSIS — L601 Onycholysis: Secondary | ICD-10-CM | POA: Diagnosis not present

## 2022-12-08 DIAGNOSIS — L259 Unspecified contact dermatitis, unspecified cause: Secondary | ICD-10-CM | POA: Diagnosis not present

## 2022-12-09 ENCOUNTER — Telehealth: Payer: Self-pay | Admitting: *Deleted

## 2022-12-09 NOTE — Telephone Encounter (Signed)
Pt has been scheduled for tele pre op appt 01/04/23 @ 9:20. Med rec and consent are done.     Patient Consent for Virtual Visit        Riley Jones has provided verbal consent on 12/09/2022 for a virtual visit (video or telephone).   CONSENT FOR VIRTUAL VISIT FOR:  Riley Jones  By participating in this virtual visit I agree to the following:  I hereby voluntarily request, consent and authorize [facility] and its employed or contracted physicians, physician assistants, nurse practitioners or other licensed health care professionals (the Practitioner), to provide me with telemedicine health care services (the "Services") as deemed necessary by the treating Practitioner. I acknowledge and consent to receive the Services by the Practitioner via telemedicine. I understand that the telemedicine visit will involve communicating with the Practitioner through live audiovisual communication technology and the disclosure of certain medical information by electronic transmission. I acknowledge that I have been given the opportunity to request an in-person assessment or other available alternative prior to the telemedicine visit and am voluntarily participating in the telemedicine visit.  I understand that I have the right to withhold or withdraw my consent to the use of telemedicine in the course of my care at any time, without affecting my right to future care or treatment, and that the Practitioner or I may terminate the telemedicine visit at any time. I understand that I have the right to inspect all information obtained and/or recorded in the course of the telemedicine visit and may receive copies of available information for a reasonable fee.  I understand that some of the potential risks of receiving the Services via telemedicine include:  Delay or interruption in medical evaluation due to technological equipment failure or disruption; Information transmitted may not be sufficient (e.g. poor  resolution of images) to allow for appropriate medical decision making by the Practitioner; and/or  In rare instances, security protocols could fail, causing a breach of personal health information.  Furthermore, I acknowledge that it is my responsibility to provide information about my medical history, conditions and care that is complete and accurate to the best of my ability. I acknowledge that Practitioner's advice, recommendations, and/or decision may be based on factors not within their control, such as incomplete or inaccurate data provided by me or distortions of diagnostic images or specimens that may result from electronic transmissions. I understand that the practice of medicine is not an exact science and that Practitioner makes no warranties or guarantees regarding treatment outcomes. I acknowledge that a copy of this consent can be made available to me via my patient portal ([facility]), or I can request a printed copy by calling the office of [facility].    I understand that my insurance will be billed for this visit.   I have read or had this consent read to me. I understand the contents of this consent, which adequately explains the benefits and risks of the Services being provided via telemedicine.  I have been provided ample opportunity to ask questions regarding this consent and the Services and have had my questions answered to my satisfaction. I give my informed consent for the services to be provided through the use of telemedicine in my medical care

## 2022-12-09 NOTE — Telephone Encounter (Signed)
   Name: Riley Jones  DOB: 1955-10-03  MRN: [id number]  Primary Cardiologist: Jenkins Rouge, MD   Preoperative team, please contact this patient and set up a phone call appointment for further preoperative risk assessment. Please obtain consent and complete medication review. Thank you for your help.  I confirm that guidance regarding antiplatelet and oral anticoagulation therapy has been completed and, if necessary, noted below.  Per guidelines patient is okay to hold Plavix 5 days prior to procedure.  Regarding ASA therapy, we recommend continuation of ASA throughout the perioperative period.  However, if the surgeon feels that cessation of ASA is required in the perioperative period, it may be stopped 5-7 days prior to surgery with a plan to resume it as soon as felt to be feasible from a surgical standpoint in the post-operative period.     Mable Fill, Marissa Nestle, NP 12/09/2022, 4:39 PM [place]

## 2022-12-09 NOTE — Telephone Encounter (Signed)
   Pre-operative Risk Assessment    Patient Name: Riley Jones  DOB: [birth year] MRN: [id number]      Request for Surgical Clearance    Procedure:   B/L UPPER EYELID BLEPHAROPTOSIS REPAIR CC: B/L UPPER EYELID BLEPHAROPLASTY  Date of Surgery:  Clearance 01/18/23                                 Surgeon:  DR. Isidoro Donning Surgeon's Group or Practice Name:  [facility]  Phone number:  [phone] Fax number:  [phone]   Type of Clearance Requested:   - Medical  - Pharmacy:  Hold Aspirin and Clopidogrel (Plavix)     Type of Anesthesia:  MAC   Additional requests/questions:    Jiles Prows   12/09/2022, 4:32 PM

## 2022-12-09 NOTE — Telephone Encounter (Signed)
Pt has been scheduled for tele pre op appt 01/04/23 @ 9:20. Med rec and consent are done.

## 2022-12-21 DIAGNOSIS — B351 Tinea unguium: Secondary | ICD-10-CM | POA: Diagnosis not present

## 2022-12-21 DIAGNOSIS — L309 Dermatitis, unspecified: Secondary | ICD-10-CM | POA: Diagnosis not present

## 2022-12-21 DIAGNOSIS — M792 Neuralgia and neuritis, unspecified: Secondary | ICD-10-CM | POA: Diagnosis not present

## 2022-12-22 ENCOUNTER — Telehealth: Payer: Self-pay | Admitting: *Deleted

## 2022-12-22 NOTE — Telephone Encounter (Signed)
Pt had stated he as unable to request refill on Hydrocodone via My Chart. Informed pt his doctor had already sent 3 rxs  -starting 11/12/22. He stated he did not get a refill for this month - told pt to call the pharmacy.

## 2023-01-04 ENCOUNTER — Ambulatory Visit: Payer: Medicare HMO | Attending: Internal Medicine

## 2023-01-04 DIAGNOSIS — Z01810 Encounter for preprocedural cardiovascular examination: Secondary | ICD-10-CM

## 2023-01-04 DIAGNOSIS — E291 Testicular hypofunction: Secondary | ICD-10-CM | POA: Diagnosis not present

## 2023-01-04 NOTE — Progress Notes (Signed)
Virtual Visit via Telephone Note   Because of Riley Jones co-morbid illnesses, he is at least at moderate risk for complications without adequate follow up.  This format is felt to be most appropriate for this patient at this time.  The patient did not have access to video technology/had technical difficulties with video requiring transitioning to audio format only (telephone).  All issues noted in this document were discussed and addressed.  No physical exam could be performed with this format.  Please refer to the patient's chart for his consent to telehealth for [facility].  Evaluation Performed:  Preoperative cardiovascular risk assessment _____________   Date:  01/04/2023   Patient ID:  Riley Jones, DOB 02-Oct-1955, MRN [id number] Patient Location:  Home Provider location:   Office  Primary Care Provider:  Gust Rung, DO Primary Cardiologist:  Charlton Haws, MD  Chief Complaint / Patient Profile   67 y.o. y/o male with a h/o coronary artery disease, tobacco abuse, diabetes, hyperlipidemia, hypertension, HIV who is pending bilateral upper eyelid blepharoplasty repair and presents today for telephonic preoperative cardiovascular risk assessment.  History of Present Illness    Riley Jones is a 67 y.o. male who presents via audio/video conferencing for a telehealth visit today.  Pt was last seen in cardiology clinic on 08/07/2022 by Dr. Eden Emms.  At that time Riley Jones was doing well .  The patient is now pending procedure as outlined above. Since his last visit, he remains stable from a cardiac standpoint.  Today he denies chest pain, shortness of breath, lower extremity edema, fatigue, palpitations, melena, hematuria, hemoptysis, diaphoresis, weakness, presyncope, syncope, orthopnea, and PND.    Past Medical History    Past Medical History:  Diagnosis Date   Anxiety    Arthritis    "pretty much all over"   Asthma    "mild" (07/17/2014)    Atypical angina    Myoview 2003, Normal EF 64%   CAD (coronary artery disease)    a. 10/20 DES to 80% mid LAD   Candida rash of groin 11/03/2019   COPD (chronic obstructive pulmonary disease) (HCC)    "mild" (07/17/2014)   Depression    Fatigue 06/22/2019   Foot pain 03/30/2011   GERD (gastroesophageal reflux disease)    H/O hiatal hernia    Headache    "weekly" (06/2014)   History of gout    HIV (human immunodeficiency virus infection) (HCC) 1995   DX after shingles followed by Dr. Sampson Goon (ID)   Hx of cardiovascular stress test    ETT-Myoview (3/16):  Ex 10:15, No Ischemia, EF 57%; Normal Study   Hyperlipidemia    Hypertension    Well Controlled off meds   Increased anion gap metabolic acidosis 01/22/2021   Kidney stones    "passed them all"   Left lower quadrant abdominal pain 11/02/2019   Other and unspecified angina pectoris 12/18/2008   Overview:  Overview:  Greatly appreciate PCP f/u.   Last Assessment & Plan:  - Patient with episode of CP - 4 days, constant, substernal, not immediately resolved with NTG, felt better after burping and passing gas - Repeat EKG done here with no acute ST/T wave changes - CP now completely resolved. Encouraged patient to call 911 if recurs - To f/u with cardio in the next 2 weeks for further assess   Pneumonia 4-5 times   Rheumatoid arthritis(714.0)    Followed by Dr. Kellie Simmering, off MTX and prdnisone since 2007  Right hip pain 04/25/2019   Right shoulder pain 01/19/2018   Shingles (herpes zoster) polyneuropathy 11/08/2018   Sleep apnea    does not wear mask (07/17/2014)   Type II diabetes mellitus (HCC)    Past Surgical History:  Procedure Laterality Date   APPENDECTOMY  ~ 1981   CARPAL TUNNEL RELEASE Left 1990's   CORONARY ANGIOPLASTY WITH STENT PLACEMENT  07/17/2014   a. DES to 80% mid LAD   HERNIA REPAIR     LAPAROSCOPIC INCISIONAL / UMBILICAL / VENTRAL HERNIA REPAIR  1990's   "it was a double; not inguinal"   LEFT HEART CATHETERIZATION  WITH CORONARY ANGIOGRAM N/A 07/17/2014   Procedure: LEFT HEART CATHETERIZATION WITH CORONARY ANGIOGRAM;  Surgeon: Wendall Stade, MD;  Location: [facility] CATH LAB;  Service: Cardiovascular;  Laterality: N/A;   PERCUTANEOUS STENT INTERVENTION  07/17/2014   Procedure: PERCUTANEOUS STENT INTERVENTION;  Surgeon: Wendall Stade, MD;  Location: [facility] CATH LAB;  Service: Cardiovascular;;   TONSILLECTOMY AND ADENOIDECTOMY  1990's   TRIGGER FINGER RELEASE Left 1990's   2nd & 5th digits   UMBILICAL HERNIA REPAIR  1980's   w/mesh   UVULOPALATOPHARYNGOPLASTY (UPPP)/TONSILLECTOMY/SEPTOPLASTY  1990's    Allergies  Allergies  Allergen Reactions   Penicillins Hives, Nausea Only and Rash    Has patient had a PCN reaction causing immediate rash, facial/tongue/throat swelling, SOB or lightheadedness with hypotension: Yes Has patient had a PCN reaction causing severe rash involving mucus membranes or skin necrosis: No Has patient had a PCN reaction that required hospitalization: No Has patient had a PCN reaction occurring within the last 10 years: No If all of the above answers are "NO", then may proceed with Cephalosporin use.    Zoloft [Sertraline Hcl] Other (See Comments)    Patient reported Psychomotor slowing / worsened depression   Victoza [Liraglutide]     Did not tolerate this medication.  Complained of dizziness even at the lowest dose   Chantix [Varenicline] Hives   Gabapentin Itching   Lisinopril Cough   Ozempic (0.25 Or 0.5 Mg-Dose) [Semaglutide(0.25 Or 0.5mg -Dos)] Itching and Rash    Rash to chest wall.   Evaristo Bury [Insulin Degludec] Rash    Home Medications    Prior to Admission medications   Medication Sig Start Date End Date Taking? Authorizing Provider  albuterol (VENTOLIN HFA) 108 (90 Base) MCG/ACT inhaler Inhale 2 puffs into the lungs every 6 (six) hours as needed for wheezing or shortness of breath. 09/30/21   Miguel Aschoff, MD  aspirin 81 MG EC tablet Take 81 mg by mouth daily.       [provider]  atorvastatin (LIPITOR) 40 MG tablet Take 1 tablet (40 mg total) by mouth daily. 04/07/22   Earl Lagos, MD  bictegravir-emtricitabine-tenofovir AF (BIKTARVY) 50-200-25 MG TABS tablet Take 1 tablet by mouth daily. 03/10/22   Kuppelweiser, Cassie L, RPH-CPP  carvedilol (COREG) 3.125 MG tablet Take 1 tablet (3.125 mg total) by mouth 2 (two) times daily with a meal. 11/12/22   Gust Rung, DO  clopidogrel (PLAVIX) 75 MG tablet Take 1 tablet (75 mg total) by mouth daily. 05/26/22   Sharlene Dory, PA-C  Clotrimazole 1 % OINT Apply topically. Clotrimazole and betamethasone diproprionate cream USP 1%/0.05% (base)    [provider]  Dermatological Products, Misc. (KERASAL FUNGAL NAIL RENEWAL EX) Apply topically.    [provider]  empagliflozin (JARDIANCE) 25 MG TABS tablet Take 1 tablet (25 mg total) by mouth daily before breakfast. 02/17/22  Elige Radonhristian, Rylee, MD  Empagliflozin-metFORMIN HCl ER (SYNJARDY XR) 12.01-999 MG TB24 Take 1 tablet by mouth 2 (two) times daily. 11/12/22   Gust RungHoffman, Erik C, DO  fluticasone (FLONASE) 50 MCG/ACT nasal spray Place 1 spray into both nostrils daily. 08/24/17   Earl LagosNarendra, Nischal, MD  HYDROcodone-acetaminophen (NORCO) 7.5-325 MG tablet Take 1 tablet by mouth 2 (two) times daily as needed for moderate pain. 11/12/22   Gust RungHoffman, Erik C, DO  HYDROcodone-acetaminophen (NORCO) 7.5-325 MG tablet Take 1 tablet by mouth 2 (two) times daily as needed for moderate pain. 12/12/22   Gust RungHoffman, Erik C, DO  HYDROcodone-acetaminophen (NORCO) 7.5-325 MG tablet Take 1 tablet by mouth 2 (two) times daily as needed for moderate pain. 01/11/23   Gust RungHoffman, Erik C, DO  hydrocortisone cream 1 % Apply 1 Application topically as needed for itching.    [provider]  hydrOXYzine (ATARAX) 10 MG tablet TAKE 1 TABLET BY MOUTH THREE TIMES DAILY AS NEEDED FOR ITCHING 12/31/21   Earl LagosNarendra, Nischal, MD  insulin glargine (LANTUS SOLOSTAR) 100 UNIT/ML  Solostar Pen Inject 40 Units into the skin daily at 10 pm. 08/31/22   Earl LagosNarendra, Nischal, MD  Insulin Pen Needle (PEN NEEDLES) 32G X 6 MM MISC 1 Units by Does not apply route daily. 11/12/22   Gust RungHoffman, Erik C, DO  loratadine (CLARITIN) 10 MG tablet Take 1 tablet (10 mg total) by mouth daily. 12/01/16   Earl LagosNarendra, Nischal, MD  losartan-hydrochlorothiazide (HYZAAR) 50-12.5 MG tablet Take 1 tablet by mouth daily. 11/12/22   Gust RungHoffman, Erik C, DO  metFORMIN (GLUCOPHAGE-XR) 500 MG 24 hr tablet TAKE 2 TABLETS BY MOUTH TWICE A DAY WITH MEALS 07/03/22   Earl LagosNarendra, Nischal, MD  Multiple Vitamins-Minerals (CENTRUM ADULTS PO) Take 1 tablet by mouth daily.    [provider]  nitroGLYCERIN (NITROSTAT) 0.4 MG SL tablet PLACE 1 TABLET UNDER THE TONGUE EVERY 5 MINUTES AS NEEDED FOR CHEST PAIN 02/05/22   Earl LagosNarendra, Nischal, MD  omeprazole (PRILOSEC) 20 MG capsule TAKE 1 CAPSULE BY MOUTH AS NEEDED 04/29/22   Earl LagosNarendra, Nischal, MD  Semaglutide, 2 MG/DOSE, (OZEMPIC, 2 MG/DOSE,) 8 MG/3ML SOPN INJECT 2 MG  SUBCUTANEOUSLY ONCE A WEEK 09/07/22   Earl LagosNarendra, Nischal, MD  tadalafil (CIALIS) 5 MG tablet Take 1 tablet (5 mg total) by mouth daily. 05/11/22   Reymundo PollGuilloud, Carolyn, MD  terazosin (HYTRIN) 2 MG capsule Take 1 capsule (2 mg total) by mouth at bedtime. 04/20/22   Earl LagosNarendra, Nischal, MD    Physical Exam    Vital Signs:  Riley Jones does not have vital signs available for review today.  Given telephonic nature of communication, physical exam is limited. AAOx3. NAD. Normal affect.  Speech and respirations are unlabored.  Accessory Clinical Findings    None  Assessment & Plan    1.  Preoperative Cardiovascular Risk Assessment:      Primary Cardiologist: Charlton HawsPeter Nishan, MD  Chart reviewed as part of pre-operative protocol coverage. Given past medical history and time since last visit, based on ACC/AHA guidelines, Riley Jones would be at acceptable risk for the planned procedure without further cardiovascular  testing.   His Plavix may be held for 5 days prior to his procedure.  Please resume as soon as hemostasis is achieved.  Regarding ASA therapy, we recommend continuation of ASA throughout the perioperative period. However, if the surgeon feels that cessation of ASA is required in the perioperative period, it may be stopped 5-7 days prior to surgery with a plan to resume it as soon  as felt to be feasible from a surgical standpoint in the post-operative period.   Patient was advised that if he develops new symptoms prior to surgery to contact our office to arrange a follow-up appointment.  He verbalized understanding.  I will route this recommendation to the requesting party via Epic fax function and remove from pre-op pool.      Time:   Today, I have spent 5 minutes with the patient with telehealth technology discussing medical history, symptoms, and management plan.  Prior to his phone evaluation I spent greater than 10 minutes reviewing his past medical history and cardiac medications.   Ronney Asters, NP  01/04/2023, 7:36 AM

## 2023-01-09 ENCOUNTER — Other Ambulatory Visit: Payer: Self-pay | Admitting: Internal Medicine

## 2023-01-09 DIAGNOSIS — Z794 Long term (current) use of insulin: Secondary | ICD-10-CM

## 2023-01-18 DIAGNOSIS — H0279 Other degenerative disorders of eyelid and periocular area: Secondary | ICD-10-CM | POA: Diagnosis not present

## 2023-01-18 DIAGNOSIS — H53483 Generalized contraction of visual field, bilateral: Secondary | ICD-10-CM | POA: Diagnosis not present

## 2023-01-18 DIAGNOSIS — H02834 Dermatochalasis of left upper eyelid: Secondary | ICD-10-CM | POA: Diagnosis not present

## 2023-01-18 DIAGNOSIS — H57813 Brow ptosis, bilateral: Secondary | ICD-10-CM | POA: Diagnosis not present

## 2023-01-18 DIAGNOSIS — H53453 Other localized visual field defect, bilateral: Secondary | ICD-10-CM | POA: Diagnosis not present

## 2023-01-18 DIAGNOSIS — H02413 Mechanical ptosis of bilateral eyelids: Secondary | ICD-10-CM | POA: Diagnosis not present

## 2023-01-18 DIAGNOSIS — H02423 Myogenic ptosis of bilateral eyelids: Secondary | ICD-10-CM | POA: Diagnosis not present

## 2023-01-18 DIAGNOSIS — H0289 Other specified disorders of eyelid: Secondary | ICD-10-CM | POA: Diagnosis not present

## 2023-01-18 DIAGNOSIS — H02831 Dermatochalasis of right upper eyelid: Secondary | ICD-10-CM | POA: Diagnosis not present

## 2023-01-24 ENCOUNTER — Other Ambulatory Visit: Payer: Self-pay | Admitting: Internal Medicine

## 2023-01-24 DIAGNOSIS — Z794 Long term (current) use of insulin: Secondary | ICD-10-CM

## 2023-02-15 DIAGNOSIS — R3912 Poor urinary stream: Secondary | ICD-10-CM | POA: Diagnosis not present

## 2023-02-17 DIAGNOSIS — E291 Testicular hypofunction: Secondary | ICD-10-CM | POA: Diagnosis not present

## 2023-02-24 ENCOUNTER — Encounter: Payer: Self-pay | Admitting: Internal Medicine

## 2023-02-24 DIAGNOSIS — Z79891 Long term (current) use of opiate analgesic: Secondary | ICD-10-CM

## 2023-02-24 DIAGNOSIS — N401 Enlarged prostate with lower urinary tract symptoms: Secondary | ICD-10-CM | POA: Diagnosis not present

## 2023-02-24 DIAGNOSIS — R3912 Poor urinary stream: Secondary | ICD-10-CM | POA: Diagnosis not present

## 2023-02-24 NOTE — Telephone Encounter (Signed)
Last rx written 01/11/23. Last OV 11/12/22. Next OV 6/13 with PCP. TOX  04/07/22.

## 2023-02-25 MED ORDER — HYDROCODONE-ACETAMINOPHEN 7.5-325 MG PO TABS: 1.0000 | ORAL_TABLET | Freq: Two times a day (BID) | ORAL | 0 refills | Status: DC | PRN

## 2023-03-08 DIAGNOSIS — E1165 Type 2 diabetes mellitus with hyperglycemia: Secondary | ICD-10-CM | POA: Diagnosis not present

## 2023-03-11 ENCOUNTER — Other Ambulatory Visit: Payer: Self-pay

## 2023-03-11 ENCOUNTER — Encounter: Payer: Self-pay | Admitting: Internal Medicine

## 2023-03-11 ENCOUNTER — Ambulatory Visit (INDEPENDENT_AMBULATORY_CARE_PROVIDER_SITE_OTHER): Payer: Medicare HMO | Admitting: Internal Medicine

## 2023-03-11 VITALS — BP 120/79 | HR 68 | Temp 98.1°F | Ht 70.0 in | Wt 204.8 lb

## 2023-03-11 DIAGNOSIS — E11618 Type 2 diabetes mellitus with other diabetic arthropathy: Secondary | ICD-10-CM | POA: Diagnosis not present

## 2023-03-11 DIAGNOSIS — M05742 Rheumatoid arthritis with rheumatoid factor of left hand without organ or systems involvement: Secondary | ICD-10-CM | POA: Diagnosis not present

## 2023-03-11 DIAGNOSIS — Z794 Long term (current) use of insulin: Secondary | ICD-10-CM

## 2023-03-11 DIAGNOSIS — E1142 Type 2 diabetes mellitus with diabetic polyneuropathy: Secondary | ICD-10-CM | POA: Diagnosis not present

## 2023-03-11 DIAGNOSIS — J432 Centrilobular emphysema: Secondary | ICD-10-CM | POA: Diagnosis not present

## 2023-03-11 DIAGNOSIS — M05741 Rheumatoid arthritis with rheumatoid factor of right hand without organ or systems involvement: Secondary | ICD-10-CM

## 2023-03-11 DIAGNOSIS — Z79891 Long term (current) use of opiate analgesic: Secondary | ICD-10-CM

## 2023-03-11 DIAGNOSIS — I10 Essential (primary) hypertension: Secondary | ICD-10-CM | POA: Diagnosis not present

## 2023-03-11 LAB — GLUCOSE, CAPILLARY: Glucose-Capillary: 181 mg/dL — ABNORMAL HIGH (ref 70–99)

## 2023-03-11 LAB — POCT GLYCOSYLATED HEMOGLOBIN (HGB A1C): Hemoglobin A1C: 9.4 % — AB (ref 4.0–5.6)

## 2023-03-11 MED ORDER — LANTUS SOLOSTAR 100 UNIT/ML ~~LOC~~ SOPN: 50.0000 [IU] | PEN_INJECTOR | Freq: Every day | SUBCUTANEOUS | 3 refills | Status: DC

## 2023-03-11 NOTE — Patient Instructions (Addendum)
I want you to try melatonin for sleep. Start with taking 5mg  chewable tablets or gummies.  You can increase to 10mg  if needed.  Go ahead and increase your lantus to 55 units a day. You can gradually increase this on a weekly basis up to 65 units to get better sugar control.

## 2023-03-12 LAB — MICROALBUMIN / CREATININE URINE RATIO
Creatinine, Urine: 72.5 mg/dL
Microalb/Creat Ratio: 21 mg/g creat (ref 0–29)
Microalbumin, Urine: 14.9 ug/mL

## 2023-03-12 NOTE — Progress Notes (Signed)
Established Patient Office Visit  Subjective   Patient ID: Riley Jones, male    DOB: March 03, 1956  Age: 67 y.o. MRN: [id number]  Chief Complaint  Patient presents with   Follow-up   Medication Refill    Xanax   Patient is here today for follow-up for diabetes he also inquires about medication for sleep. For his diabetes he has been adherent to [place] twice daily.  Lantus 48-50 units daily, Ozempic 2 mg daily.  He notes his appetite has been well-controlled he is not have any significant side effects from his medications.  His CGM was downloaded for the most recent 14 days he was very high sugars 26% of the time high 51% in target range 23% with 0% lows.  Glucose management indicator is 8.6 with an average glucose of 221.  Review shows that he is pretty consistently high with highest values afternoon to nighttime.  For his sleep he reports he previously was on Xanax to help him fall asleep.  He notes that when he took it he can usually fall asleep pretty quickly however if he did not fall asleep it would have an activating sensation and he would stay up even longer.  He has not tried anything else previously for sleep initiation.  He recently saw urology has been placed on testosterone gel.  He has not noticed any noticeable change in gross symptoms however is now able to have better erections.  He is following up with them to adjust dose.  Recovering well after blephroplasty.     Objective:     BP 120/79 (BP Location: Right Arm, Patient Position: Sitting, Cuff Size: Normal)   Pulse 68   Temp 98.1 F (36.7 C) (Oral)   Ht 5\' 10"  (1.778 m)   Wt 204 lb 12.8 oz (92.9 kg)   SpO2 99% Comment: RA  BMI 29.39 kg/m  BP Readings from Last 3 Encounters:  03/11/23 120/79  11/12/22 127/74  09/09/22 (!) 141/76   Wt Readings from Last 3 Encounters:  03/11/23 204 lb 12.8 oz (92.9 kg)  11/12/22 199 lb 11.2 oz (90.6 kg)  09/09/22 202 lb (91.6 kg)      Physical Exam Constitutional:       Appearance: Normal appearance.  Cardiovascular:     Rate and Rhythm: Normal rate and regular rhythm.  Pulmonary:     Effort: Pulmonary effort is normal.  Neurological:     Mental Status: He is alert.  Psychiatric:        Mood and Affect: Mood normal.        Behavior: Behavior normal.      Results for orders placed or performed in visit on 03/11/23  Microalbumin / Creatinine Urine Ratio  Result Value Ref Range   Creatinine, Urine 72.5 Not Estab. mg/dL   Microalbumin, Urine 82.9 Not Estab. ug/mL   Microalb/Creat Ratio 21 0 - 29 mg/g creat  Glucose, capillary  Result Value Ref Range   Glucose-Capillary 181 (H) 70 - 99 mg/dL  POC Hbg F6O  Result Value Ref Range   Hemoglobin A1C 9.4 (A) 4.0 - 5.6 %   HbA1c POC (<> result, manual entry)     HbA1c, POC (prediabetic range)     HbA1c, POC (controlled diabetic range)      Last CBC Lab Results  Component Value Date   WBC 5.6 08/12/2022   HGB 15.1 08/12/2022   HCT 42.7 08/12/2022   MCV 90 08/12/2022   MCH 31.9 08/12/2022   RDW  13.0 08/12/2022   PLT CANCELED 08/12/2022   Last metabolic panel Lab Results  Component Value Date   GLUCOSE 170 (H) 05/26/2022   NA 141 05/26/2022   K 4.6 05/26/2022   CL 105 05/26/2022   CO2 26 05/26/2022   BUN 14 05/26/2022   CREATININE 0.94 05/26/2022   EGFR 89 05/26/2022   CALCIUM 10.3 05/26/2022   PHOS 3.5 10/07/2011   PROT 6.9 05/26/2022   ALBUMIN 4.4 01/15/2020   BILITOT 0.6 05/26/2022   ALKPHOS 90 01/15/2020   AST 40 (H) 05/26/2022   ALT 51 (H) 05/26/2022   ANIONGAP 8 02/28/2017   Last hemoglobin A1c Lab Results  Component Value Date   HGBA1C 9.4 (A) 03/11/2023      The 10-year ASCVD risk score (Arnett DK, et al., 2019) is: 25.7%    Assessment & Plan:   Problem List Items Addressed This Visit       Cardiovascular and Mediastinum   Essential hypertension (Chronic)    Blood pressure is well-controlled we will plan to continue terazosin 2 mg daily, carvedilol 3.125 mg  twice daily, and losartan hydrochlorothiazide 50-12.5 mg daily        Respiratory   COPD (chronic obstructive pulmonary disease) (HCC) (Chronic)    Centrilobular emphysema well-controlled with albuterol no wheezing on exam today.        Endocrine   Type 2 diabetes mellitus with peripheral neuropathy (HCC) - Primary (Chronic)   Relevant Medications   insulin glargine (LANTUS SOLOSTAR) 100 UNIT/ML Solostar Pen   Other Relevant Orders   POC Hbg A1C (Completed)   Microalbumin / Creatinine Urine Ratio (Completed)     Musculoskeletal and Integument   Rheumatoid arthritis (HCC)    Notes he recently saw dermatology who prescribed him a new steroid cream which is helping he reports that dermatologist told him this was related to his rheumatoid arthritis.  Has been on adjunctive opioid therapy with hydrocodone for a long time helping him with functional movement.  No red flag behavior.  Will repeat urine toxicology today.  Reviewed PDMP and refilled medications.        Other   Chronic prescription opiate use   Relevant Orders   ToxAssure Select,+Antidepr,UR   Other Visit Diagnoses     Type 2 diabetes mellitus with other diabetic arthropathy, with long-term current use of insulin (HCC)       Relevant Medications   insulin glargine (LANTUS SOLOSTAR) 100 UNIT/ML Solostar Pen       Return in about 3 months (around 06/11/2023).    Gust Rung, DO

## 2023-03-12 NOTE — Assessment & Plan Note (Signed)
Centrilobular emphysema well-controlled with albuterol no wheezing on exam today.

## 2023-03-12 NOTE — Assessment & Plan Note (Addendum)
Notes he recently saw dermatology who prescribed him a new steroid cream which is helping he reports that dermatologist told him this was related to his rheumatoid arthritis.  Has been on adjunctive opioid therapy with hydrocodone for a long time helping him with functional movement.  No red flag behavior.  Will repeat urine toxicology today.  Reviewed PDMP and refilled medications.

## 2023-03-12 NOTE — Assessment & Plan Note (Signed)
Blood pressure is well-controlled we will plan to continue terazosin 2 mg daily, carvedilol 3.125 mg twice daily, and losartan hydrochlorothiazide 50-12.5 mg daily

## 2023-03-16 ENCOUNTER — Encounter: Payer: Self-pay | Admitting: Internal Medicine

## 2023-03-16 DIAGNOSIS — R948 Abnormal results of function studies of other organs and systems: Secondary | ICD-10-CM | POA: Diagnosis not present

## 2023-03-16 DIAGNOSIS — M205X2 Other deformities of toe(s) (acquired), left foot: Secondary | ICD-10-CM | POA: Diagnosis not present

## 2023-03-16 DIAGNOSIS — E11319 Type 2 diabetes mellitus with unspecified diabetic retinopathy without macular edema: Secondary | ICD-10-CM | POA: Insufficient documentation

## 2023-03-16 DIAGNOSIS — E1142 Type 2 diabetes mellitus with diabetic polyneuropathy: Secondary | ICD-10-CM | POA: Diagnosis not present

## 2023-03-16 DIAGNOSIS — E291 Testicular hypofunction: Secondary | ICD-10-CM | POA: Diagnosis not present

## 2023-03-16 DIAGNOSIS — M205X1 Other deformities of toe(s) (acquired), right foot: Secondary | ICD-10-CM | POA: Diagnosis not present

## 2023-03-17 LAB — TOXASSURE SELECT,+ANTIDEPR,UR

## 2023-03-23 DIAGNOSIS — N401 Enlarged prostate with lower urinary tract symptoms: Secondary | ICD-10-CM | POA: Diagnosis not present

## 2023-03-23 DIAGNOSIS — H353121 Nonexudative age-related macular degeneration, left eye, early dry stage: Secondary | ICD-10-CM | POA: Diagnosis not present

## 2023-03-23 DIAGNOSIS — H02403 Unspecified ptosis of bilateral eyelids: Secondary | ICD-10-CM | POA: Diagnosis not present

## 2023-03-23 DIAGNOSIS — H25813 Combined forms of age-related cataract, bilateral: Secondary | ICD-10-CM | POA: Diagnosis not present

## 2023-03-23 DIAGNOSIS — R3912 Poor urinary stream: Secondary | ICD-10-CM | POA: Diagnosis not present

## 2023-03-23 DIAGNOSIS — H3582 Retinal ischemia: Secondary | ICD-10-CM | POA: Diagnosis not present

## 2023-03-23 DIAGNOSIS — E113293 Type 2 diabetes mellitus with mild nonproliferative diabetic retinopathy without macular edema, bilateral: Secondary | ICD-10-CM | POA: Diagnosis not present

## 2023-03-23 DIAGNOSIS — H35363 Drusen (degenerative) of macula, bilateral: Secondary | ICD-10-CM | POA: Diagnosis not present

## 2023-03-24 ENCOUNTER — Other Ambulatory Visit: Payer: Self-pay

## 2023-03-24 ENCOUNTER — Ambulatory Visit (INDEPENDENT_AMBULATORY_CARE_PROVIDER_SITE_OTHER): Payer: Medicare HMO

## 2023-03-24 ENCOUNTER — Encounter: Payer: Self-pay | Admitting: Internal Medicine

## 2023-03-24 ENCOUNTER — Other Ambulatory Visit: Payer: Self-pay | Admitting: Pharmacist

## 2023-03-24 VITALS — Ht 70.0 in | Wt 204.0 lb

## 2023-03-24 DIAGNOSIS — E11618 Type 2 diabetes mellitus with other diabetic arthropathy: Secondary | ICD-10-CM

## 2023-03-24 DIAGNOSIS — Z0000 Encounter for general adult medical examination without abnormal findings: Secondary | ICD-10-CM

## 2023-03-24 DIAGNOSIS — B20 Human immunodeficiency virus [HIV] disease: Secondary | ICD-10-CM

## 2023-03-24 MED ORDER — OZEMPIC (2 MG/DOSE) 8 MG/3ML ~~LOC~~ SOPN: 2.0000 mg | PEN_INJECTOR | SUBCUTANEOUS | 1 refills | Status: DC

## 2023-03-24 NOTE — Progress Notes (Addendum)
Subjective:   Riley Jones is a 67 y.o. male who presents for Medicare Annual/Subsequent preventive examination.  Visit Complete: Virtual  I connected with  Velda Shell on 03/24/23 by a audio enabled telemedicine application and verified that I am speaking with the correct person using two identifiers.  Patient Location: Home  Provider Location: Home Office  I discussed the limitations of evaluation and management by telemedicine. The patient expressed understanding and agreed to proceed.  Patient Medicare AWV questionnaire was completed by the patient on ; I have confirmed that all information answered by patient is correct and no changes since this date.  Review of Systems     Cardiac Risk Factors include: advanced age (>31men, >54 women);male gender;diabetes mellitus;hypertension     Objective:    Today's Vitals   03/24/23 0821  Weight: 204 lb (92.5 kg)  Height: 5\' 10"  (1.778 m)   Body mass index is 29.27 kg/m.     03/24/2023    8:31 AM 03/11/2023    8:56 AM 11/12/2022   10:09 AM 08/12/2022   11:32 AM 05/27/2022   10:16 AM 04/07/2022    8:32 AM 02/17/2022    4:13 PM  Advanced Directives  Does Patient Have a Medical Advance Directive? Yes Yes Yes Yes No Yes Yes  Type of Estate agent of [place];Living will Healthcare Power of [place];Living will Healthcare Power of [place];Living will Healthcare Power of [place];Living will  Healthcare Power of [place];Living will   Does patient want to make changes to medical advance directive?  No - Patient declined No - Patient declined No - Patient declined  No - Guardian declined No - Patient declined  Copy of Healthcare Power of Attorney in Chart? No - copy requested No - copy requested No - copy requested No - copy requested  No - copy requested No - copy requested  Would patient like information on creating a medical advance directive?     No - Patient declined      Current Medications  (verified) Outpatient Encounter Medications as of 03/24/2023  Medication Sig   albuterol (VENTOLIN HFA) 108 (90 Base) MCG/ACT inhaler Inhale 2 puffs into the lungs every 6 (six) hours as needed for wheezing or shortness of breath.   aspirin 81 MG EC tablet Take 81 mg by mouth daily.     atorvastatin (LIPITOR) 40 MG tablet Take 1 tablet (40 mg total) by mouth daily.   bictegravir-emtricitabine-tenofovir AF (BIKTARVY) 50-200-25 MG TABS tablet Take 1 tablet by mouth daily.   carvedilol (COREG) 3.125 MG tablet Take 1 tablet (3.125 mg total) by mouth 2 (two) times daily with a meal.   ciclopirox (PENLAC) 8 % solution Apply topically at bedtime. Apply over nail and surrounding skin. Apply daily over previous coat. After seven (7) days, may remove with alcohol and continue cycle.   clopidogrel (PLAVIX) 75 MG tablet Take 1 tablet (75 mg total) by mouth daily.   clotrimazole-betamethasone (LOTRISONE) cream Apply 1 Application topically 2 (two) times daily.   Dermatological Products, Misc. (KERASAL FUNGAL NAIL RENEWAL EX) Apply topically.   Empagliflozin-metFORMIN HCl ER (SYNJARDY XR) 12.01-999 MG TB24 Take 1 tablet by mouth 2 (two) times daily.   fluticasone (FLONASE) 50 MCG/ACT nasal spray Place 1 spray into both nostrils daily.   HYDROcodone-acetaminophen (NORCO) 7.5-325 MG tablet Take 1 tablet by mouth 2 (two) times daily as needed for moderate pain.   hydrOXYzine (ATARAX) 10 MG tablet TAKE 1 TABLET BY MOUTH THREE TIMES DAILY AS  NEEDED FOR ITCHING   insulin glargine (LANTUS SOLOSTAR) 100 UNIT/ML Solostar Pen Inject 50-65 Units into the skin daily at 10 pm.   Insulin Pen Needle (PEN NEEDLES) 32G X 6 MM MISC 1 Units by Does not apply route daily.   loratadine (CLARITIN) 10 MG tablet Take 1 tablet (10 mg total) by mouth daily.   losartan-hydrochlorothiazide (HYZAAR) 50-12.5 MG tablet Take 1 tablet by mouth daily.   Multiple Vitamins-Minerals (CENTRUM ADULTS PO) Take 1 tablet by mouth daily.    nitroGLYCERIN (NITROSTAT) 0.4 MG SL tablet PLACE 1 TABLET UNDER THE TONGUE EVERY 5 MINUTES AS NEEDED FOR CHEST PAIN   omeprazole (PRILOSEC) 20 MG capsule TAKE 1 CAPSULE BY MOUTH AS NEEDED   Semaglutide, 2 MG/DOSE, (OZEMPIC, 2 MG/DOSE,) 8 MG/3ML SOPN INJECT 2MG  INTO SKIN ONCE WEEKLY   tadalafil (CIALIS) 5 MG tablet Take 1 tablet (5 mg total) by mouth daily.   terazosin (HYTRIN) 2 MG capsule Take 1 capsule (2 mg total) by mouth at bedtime.   Testosterone 1.62 % GEL Place 2 Pump onto the skin daily. Each shoulder   triamcinolone cream (KENALOG) 0.1 % Apply 1 Application topically 2 (two) times daily.   No facility-administered encounter medications on file as of 03/24/2023.    Allergies (verified) Penicillins, Zoloft [sertraline hcl], Victoza [liraglutide], Chantix [varenicline], Gabapentin, Lisinopril, Ozempic (0.25 or 0.5 mg-dose) [semaglutide(0.25 or 0.5mg -dos)], and Tresiba [insulin degludec]   History: Past Medical History:  Diagnosis Date   Anxiety    Arthritis    "pretty much all over"   Asthma    "mild" (07/17/2014)   Atypical angina    Myoview 2003, Normal EF 64%   CAD (coronary artery disease)    a. 10/20 DES to 80% mid LAD   Candida rash of groin 11/03/2019   COPD (chronic obstructive pulmonary disease) (HCC)    "mild" (07/17/2014)   Depression    Fatigue 06/22/2019   Foot pain 03/30/2011   GERD (gastroesophageal reflux disease)    H/O hiatal hernia    Headache    "weekly" (06/2014)   History of gout    HIV (human immunodeficiency virus infection) (HCC) 1995   DX after shingles followed by Dr. Sampson Goon (ID)   Hx of cardiovascular stress test    ETT-Myoview (3/16):  Ex 10:15, No Ischemia, EF 57%; Normal Study   Hyperlipidemia    Hypertension    Well Controlled off meds   Increased anion gap metabolic acidosis 01/22/2021   Kidney stones    "passed them all"   Left lower quadrant abdominal pain 11/02/2019   Other and unspecified angina pectoris 12/18/2008   Overview:   Overview:  Greatly appreciate PCP f/u.   Last Assessment & Plan:  - Patient with episode of CP - 4 days, constant, substernal, not immediately resolved with NTG, felt better after burping and passing gas - Repeat EKG done here with no acute ST/T wave changes - CP now completely resolved. Encouraged patient to call 911 if recurs - To f/u with cardio in the next 2 weeks for further assess   Pneumonia 4-5 times   Rheumatoid arthritis(714.0)    Followed by Dr. 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 DES to 80% mid LAD   HERNIA REPAIR     LAPAROSCOPIC INCISIONAL / UMBILICAL / VENTRAL HERNIA REPAIR  1990's   "it was a double; not inguinal"   LEFT HEART CATHETERIZATION WITH CORONARY ANGIOGRAM N/A 07/17/2014   Procedure: LEFT HEART CATHETERIZATION WITH CORONARY ANGIOGRAM;  Surgeon: Wendall Stade, MD;  Location[facility] C[facility] LAB;  Service: Cardiovascular;  Laterality: N/A;   PERCUTANEOUS STENT INTERVENTION  07/17/2014   Procedure: PERCUTANEOUS STENT INTERVENTION;  Surgeon: Wendall Stade, MD;  Location[facility] CATH LAB;  Service: Cardiovascular;;   TONSILLECTOMY AND ADENOIDECTOMY  1990's   TRIGGER FINGER RELEASE Left 1990's   2nd & 5th digits   UMBILICAL HERNIA REPAIR  1980's   w/mesh   UVULOPALATOPHARYNGOPLASTY (UPPP)/TONSILLECTOMY/SEPTOPLASTY  1990's   Family History  Problem Relation Age of Onset   Osteoporosis Mother    Diabetes Mother    Heart disease Father    Heart attack Father    Diabetes Father    COPD Father    Heart attack Brother    Stroke Maternal Grandmother    Stroke Maternal Grandfather    Stroke Paternal  Grandmother    Stroke Paternal Grandfather    Social History   Socioeconomic History   Marital status: Single    Spouse name: Not on file   Number of children: Not on file   Years of education: 13   Highest education level: Not on file  Occupational History   Occupation: [employer]   Occupation: Naval architect (retired)  Tobacco Use   Smoking status: Former    Packs/day: 1.00    Years: 46.00    Additional pack years: 0.00    Total pack years: 46.00    Types: Cigarettes    Quit date: 12/27/2021    Years since quitting: 1.2   Smokeless tobacco: Never  Vaping Use   Vaping Use: Never used  Substance and Sexual Activity   Alcohol use: Not Currently    Alcohol/week: 1.0 standard drink of alcohol    Types: 1 Glasses of wine per week    Comment: 07/17/2014 "glass of wine once or twice/month"   Drug use: No   Sexual activity: Yes    Partners: Male    Comment: declined condoms  Other Topics Concern   Not on file  Social History Narrative   [place] approved beginning 12/11/2009 - 12/27/2010   Juanell Fairly benefits approved; patient eligible for 100% discount for out patient labs and office visits. Patient eligible for 70% discount for other services per Kandice Robinsons 09/08/2010      Patient is on disability, and walks 2-3 times per week.    Social Determinants of Health   Financial Resource Strain: Low Risk  (03/24/2023)   Overall Financial Resource Strain (CARDIA)    Difficulty of Paying Living Expenses: Not hard at all  Food Insecurity: Food Insecurity Present (01/30/2022)   Hunger Vital Sign    Worried About Running Out of Food in the Last Year: Sometimes true    Ran Out of Food in the Last Year: Sometimes true  Transportation Needs: No Transportation Needs (03/24/2023)   PRAPARE - Administrator, Civil Service (Medical): No    Lack of Transportation (Non-Medical): No  Physical Activity: Inactive (03/24/2023)   Exercise Vital Sign    Days of Exercise per Week: 0 days     Minutes of Exercise per Session:  0 min  Stress: No Stress Concern Present (03/24/2023)   [employer] of Occupational Health - Occupational Stress Questionnaire    Feeling of Stress : Not at all  Social Connections: Socially Isolated (03/24/2023)   Social Connection and Isolation Panel [NHANES]    Frequency of Communication with Friends and Family: More than three times a week    Frequency of Social Gatherings with Friends and Family: More than three times a week    Attends Religious Services: Never    Database administrator or Organizations: No    Attends Banker Meetings: Never    Marital Status: Never married    Tobacco Counseling Counseling given: Not Answered   Clinical Intake:  Pre-visit preparation completed: No   Activities of Daily Living    03/24/2023    8:29 AM 03/11/2023    8:56 AM  In your present state of health, do you have any difficulty performing the following activities:  Hearing? 0 0  Vision? 0 0  Difficulty concentrating or making decisions? 0 0  Walking or climbing stairs? 0 0  Dressing or bathing? 0 0  Doing errands, shopping? 0 0  Preparing Food and eating ? N   Using the Toilet? N   In the past six months, have you accidently leaked urine? Y   Comment Followed by urologist   Do you have problems with loss of bowel control? N   Managing your Medications? N   Managing your Finances? N   Housekeeping or managing your Housekeeping? N     Patient Care Team: Gust Rung, DO as PCP - General (Internal Medicine) Ninetta Lights Lacretia Leigh, MD as PCP - Infectious Diseases (Infectious Diseases) Wendall Stade, MD as PCP - Cardiology (Cardiology) Blair Promise, OD as Consulting Physician (Optometry) Corbin Ade, OD as Referring Physician (Optometry)  Indicate any recent Medical Services you may have received from other than [facility] providers in the past year (date may be approximate).     Assessment:   This is a routine wellness  examination for Hadi.  Hearing/Vision screen Hearing Screening - Comments:: Denies hearing difficulties   Vision Screening - Comments:: Wears rx glasses - up to date with routine eye exams with  [facility]  Dietary issues and exercise activities discussed:     Goals Addressed               This Visit's Progress     Get my A1C down (pt-stated)        Stay alive.       Depression Screen    03/24/2023    8:28 AM 03/11/2023   10:52 AM 11/12/2022   10:57 AM 11/12/2022   10:07 AM 08/12/2022   11:30 AM 06/09/2022    8:27 AM 05/27/2022   10:16 AM  PHQ 2/9 Scores  PHQ - 2 Score 0 0 1 1 0 1 0    Fall Risk    03/24/2023    8:29 AM 03/11/2023    8:55 AM 11/12/2022   10:05 AM 08/12/2022    9:03 AM 06/09/2022    8:27 AM  Fall Risk   Falls in the past year? 1 1 1 1 1   Number falls in past yr: 0 1 1 0 1  Injury with Fall? 0 0 0 1 0  Risk for fall due to : No Fall Risks Other (Comment) Impaired balance/gait;Other (Comment) Impaired balance/gait History of fall(s)  Risk for fall due to: Comment  Tripped.  Also feet pain.    Follow up Falls prevention discussed  Falls evaluation completed;Falls prevention discussed Falls evaluation completed Falls evaluation completed    MEDICARE RISK AT HOME:  Medicare Risk at Home - 03/24/23 0836     Any stairs in or around the home? Yes    If so, are there any without handrails? No    Home free of loose throw rugs in walkways, pet beds, electrical cords, etc? Yes    Adequate lighting in your home to reduce risk of falls? Yes    Life alert? No    Use of a cane, walker or w/c? No    Grab bars in the bathroom? No    Shower chair or bench in shower? No    Elevated toilet seat or a handicapped toilet? No             TIMED UP AND GO:  Was the test performed?      Cognitive Function:        03/24/2023    8:31 AM 01/30/2022   11:19 AM  6CIT Screen  What Year? 0 points 0 points  What month? 0 points 0 points  What time? 0  points 0 points  Count back from 20 0 points 0 points  Months in reverse 0 points 0 points  Repeat phrase 0 points 0 points  Total Score 0 points 0 points    Immunizations Immunization History  Administered Date(s) Administered   Fluad Quad(high Dose 65+) 06/09/2019, 05/27/2021   Hepatitis A 12/12/2007, 12/19/2008   Influenza Split 06/09/2012, 05/31/2014, 05/27/2021   Influenza Whole 06/07/2006, 07/19/2007, 06/27/2008, 07/24/2009, 06/26/2010, 06/09/2011   Influenza, High Dose Seasonal PF 05/10/2020, 05/05/2022   Influenza,inj,Quad PF,6+ Mos 05/22/2013, 04/29/2016, 05/06/2017, 07/04/2018   Influenza,inj,quad, With Preservative 05/07/2015   Influenza-Unspecified 05/30/2014, 05/07/2015, 11/13/2015   PFIZER Comirnaty(Gray Top)Covid-19 Tri-Sucrose Vaccine 01/28/2021   PFIZER(Purple Top)SARS-COV-2 Vaccination 10/21/2019, 11/11/2019, 05/24/2020, 07/05/2021   Pfizer Covid-19 Vaccine Bivalent Booster 19yrs & up 07/05/2021   Pneumococcal Conjugate-13 02/25/2016   Pneumococcal Polysaccharide-23 06/07/2006, 06/29/2011, 08/08/2019   Respiratory Syncytial Virus Vaccine,Recomb Aduvanted(Arexvy) 07/23/2022   Td 12/04/2009   Tdap 06/14/2020   Zoster Recombinat (Shingrix) 05/05/2019, 12/15/2019   Zoster, Live 07/06/2014    TDAP status: Up to date  Flu Vaccine status: Up to date  Pneumococcal vaccine status: Up to date  Covid-19 vaccine status: Completed vaccines  Qualifies for Shingles Vaccine? Yes   Zostavax completed Yes   Shingrix Completed?: Yes  Screening Tests Health Maintenance  Topic Date Due   OPHTHALMOLOGY EXAM  10/28/2022   Lung Cancer Screening  04/18/2023   COVID-19 Vaccine (6 - 2023-24 season) 04/09/2023 (Originally 05/29/2022)   INFLUENZA VACCINE  04/29/2023   Diabetic kidney evaluation - eGFR measurement  05/27/2023   LIPID PANEL  05/27/2023   HEMOGLOBIN A1C  06/11/2023   FOOT EXAM  11/30/2023   Diabetic kidney evaluation - Urine ACR  03/10/2024   Medicare Annual  Wellness (AWV)  03/23/2024   Colonoscopy  11/17/2027   DTaP/Tdap/Td (3 - Td or Tdap) 06/14/2030   Pneumonia Vaccine 13+ Years old  Completed   Hepatitis C Screening  Completed   Zoster Vaccines- Shingrix  Completed   HPV VACCINES  Aged Out   COLON CANCER SCREENING ANNUAL FOBT  Discontinued    Health Maintenance  Health Maintenance Due  Topic Date Due   OPHTHALMOLOGY EXAM  10/28/2022   Lung Cancer Screening  04/18/2023    Colorectal cancer screening: Type of screening: Colonoscopy. Completed  08/19/12. Repeat every 10 years  Lung Cancer Screening: (Low Dose CT Chest recommended if Age 13-80 years, 20 pack-year currently smoking OR have quit w/in 15years.) does qualify.     Additional Screening:  Hepatitis C Screening: does qualify; Completed 11/22/06  Vision Screening: Recommended annual ophthalmology exams for early detection of glaucoma and other disorders of the eye. Is the patient up to date with their annual eye exam?  Yes  Who is the provider or what is the name of the office in which the patient attends annual eye exams? [facility] If pt is not established with a provider, would they like to be referred to a provider to establish care? No .   Dental Screening: Recommended annual dental exams for proper oral hygiene  Diabetic Foot Exam: Diabetic Foot Exam: Overdue, Pt has been advised about the importance in completing this exam. Pt is scheduled for diabetic foot exam on Followed by PCP.  Community Resource Referral / Chronic Care Management:  CRR required this visit?  No   CCM required this visit?  Appt scheduled with PCP     Plan:     I have personally reviewed and noted the following in the patient's chart:   Medical and social history Use of alcohol, tobacco or illicit drugs  Current medications and supplements including opioid prescriptions. Patient is currently taking opioid prescriptions. Information provided to patient regarding non-opioid  alternatives. Patient advised to discuss non-opioid treatment plan with their provider. Functional ability and status Nutritional status Physical activity Advanced directives List of other physicians Hospitalizations, surgeries, and ER visits in previous 12 months Vitals Screenings to include cognitive, depression, and falls Referrals and appointments  In addition, I have reviewed and discussed with patient certain preventive protocols, quality metrics, and best practice recommendations. A written personalized care plan for preventive services as well as general preventive health recommendations were provided to patient.     Tillie Rung, LPN   [id number]   After Visit Summary: (MyChart) Due to this being a telephonic visit, the after visit summary with patients personalized plan was offered to patient via MyChart   Nurse Notes: None

## 2023-03-24 NOTE — Addendum Note (Signed)
Addended bySharrell Ku on: 03/24/2023 04:46 PM   Modules accepted: Level of Service

## 2023-03-24 NOTE — Patient Instructions (Addendum)
Riley Jones , Thank you for taking time to come for your Medicare Wellness Visit. I appreciate your ongoing commitment to your health goals. Please review the following plan we discussed and let me know if I can assist you in the future.   These are the goals we discussed:  Goals       Blood Pressure < 140/90      Get my A1C down (pt-stated)      Stay alive.      HEMOGLOBIN A1C < 7.0      LDL CALC < 100      Quit smoking / using tobacco      Reduce fat intake by changing to 1% milk        This is a list of the screening recommended for you and due dates:  Health Maintenance  Topic Date Due   Eye exam for diabetics  10/28/2022   Screening for Lung Cancer  04/18/2023   COVID-19 Vaccine (6 - 2023-24 season) 04/09/2023*   Flu Shot  04/29/2023   Yearly kidney function blood test for diabetes  05/27/2023   Lipid (cholesterol) test  05/27/2023   Hemoglobin A1C  06/11/2023   Complete foot exam   11/30/2023   Yearly kidney health urinalysis for diabetes  03/10/2024   Medicare Annual Wellness Visit  03/23/2024   Colon Cancer Screening  11/17/2027   DTaP/Tdap/Td vaccine (3 - Td or Tdap) 06/14/2030   Pneumonia Vaccine  Completed   Hepatitis C Screening  Completed   Zoster (Shingles) Vaccine  Completed   HPV Vaccine  Aged Out   Stool Blood Test  Discontinued  *Topic was postponed. The date shown is not the original due date.   Opioid Pain Medicine Management Opioids are powerful medicines that are used to treat moderate to severe pain. When used for short periods of time, they can help you to: Sleep better. Do better in physical or occupational therapy. Feel better in the first few days after an injury. Recover from surgery. Opioids should be taken with the supervision of a trained health care provider. They should be taken for the shortest period of time possible. This is because opioids can be addictive, and the longer you take opioids, the greater your risk of addiction. This  addiction can also be called opioid use disorder. What are the risks? Using opioid pain medicines for longer than 3 days increases your risk of side effects. Side effects include: Constipation. Nausea and vomiting. Breathing difficulties (respiratory depression). Drowsiness. Confusion. Opioid use disorder. Itching. Taking opioid pain medicine for a long period of time can affect your ability to do daily tasks. It also puts you at risk for: Motor vehicle crashes. Depression. Suicide. Heart attack. Overdose, which can be life-threatening. What is a pain treatment plan? A pain treatment plan is an agreement between you and your health care provider. Pain is unique to each person, and treatments vary depending on your condition. To manage your pain, you and your health care provider need to work together. To help you do this: Discuss the goals of your treatment, including how much pain you might expect to have and how you will manage the pain. Review the risks and benefits of taking opioid medicines. Remember that a good treatment plan uses more than one approach and minimizes the chance of side effects. Be honest about the amount of medicines you take and about any drug or alcohol use. Get pain medicine prescriptions from only one health care provider. Pain  can be managed with many types of alternative treatments. Ask your health care provider to refer you to one or more specialists who can help you manage pain through: Physical or occupational therapy. Counseling (cognitive behavioral therapy). Good nutrition. Biofeedback. Massage. Meditation. Non-opioid medicine. Following a gentle exercise program. How to use opioid pain medicine Taking medicine Take your pain medicine exactly as told by your health care provider. Take it only when you need it. If your pain gets less severe, you may take less than your prescribed dose if your health care provider approves. If you are not having  pain, do nottake pain medicine unless your health care provider tells you to take it. If your pain is severe, do nottry to treat it yourself by taking more pills than instructed on your prescription. Contact your health care provider for help. Write down the times when you take your pain medicine. It is easy to become confused while on pain medicine. Writing the time can help you avoid overdose. Take other over-the-counter or prescription medicines only as told by your health care provider. Keeping yourself and others safe  While you are taking opioid pain medicine: Do not drive, use machinery, or power tools. Do not sign legal documents. Do not drink alcohol. Do not take sleeping pills. Do not supervise children by yourself. Do not do activities that require climbing or being in high places. Do not go to a lake, river, ocean, spa, or swimming pool. Do not share your pain medicine with anyone. Keep pain medicine in a locked cabinet or in a secure area where pets and children cannot reach it. Stopping your use of opioids If you have been taking opioid medicine for more than a few weeks, you may need to slowly decrease (taper) how much you take until you stop completely. Tapering your use of opioids can decrease your risk of symptoms of withdrawal, such as: Pain and cramping in the abdomen. Nausea. Sweating. Sleepiness. Restlessness. Uncontrollable shaking (tremors). Cravings for the medicine. Do not attempt to taper your use of opioids on your own. Talk with your health care provider about how to do this. Your health care provider may prescribe a step-down schedule based on how much medicine you are taking and how long you have been taking it. Getting rid of leftover pills Do not save any leftover pills. Get rid of leftover pills safely by: Taking the medicine to a prescription take-back program. This is usually offered by the county or law enforcement. Bringing them to a pharmacy that  has a drug disposal container. Flushing them down the toilet. Check the label or package insert of your medicine to see whether this is safe to do. Throwing them out in the trash. Check the label or package insert of your medicine to see whether this is safe to do. If it is safe to throw it out, remove the medicine from the original container, put it into a sealable bag or container, and mix it with used coffee grounds, food scraps, dirt, or cat litter before putting it in the trash. Follow these instructions at home: Activity Do exercises as told by your health care provider. Avoid activities that make your pain worse. Return to your normal activities as told by your health care provider. Ask your health care provider what activities are safe for you. General instructions You may need to take these actions to prevent or treat constipation: Drink enough fluid to keep your urine pale yellow. Take over-the-counter or prescription medicines. Eat  foods that are high in fiber, such as beans, whole grains, and fresh fruits and vegetables. Limit foods that are high in fat and processed sugars, such as fried or sweet foods. Keep all follow-up visits. This is important. Where to find support If you have been taking opioids for a long time, you may benefit from receiving support for quitting from a local support group or counselor. Ask your health care provider for a referral to these resources in your area. Where to find more information Centers for Disease Control and Prevention (CDC): FootballExhibition.com.br [place] Food and Drug Administration (FDA): PumpkinSearch.com.ee Get help right away if: You may have taken too much of an opioid (overdosed). Common symptoms of an overdose: Your breathing is slower or more shallow than normal. You have a very slow heartbeat (pulse). You have slurred speech. You have nausea and vomiting. Your pupils become very small. You have other potential symptoms: You are very confused. You  faint or feel like you will faint. You have cold, clammy skin. You have blue lips or fingernails. You have thoughts of harming yourself or harming others. These symptoms may represent a serious problem that is an emergency. Do not wait to see if the symptoms will go away. Get medical help right away. Call your local emergency services (911 in the [place] Do not drive yourself to the hospital.  If you ever feel like you may hurt yourself or others, or have thoughts about taking your own life, get help right away. Go to your nearest emergency department or: Call your local emergency services (911 in the [place] Call the [facility] (352-236-5740 in the [place] Call a suicide crisis helpline, such as the National Suicide Prevention Lifeline at 540-772-9587 or 988 in the [place] This is open 24 hours a day in the [place] Text the Crisis Text Line at 559-475-2572 (in the [place] Summary Opioid medicines can help you manage moderate to severe pain for a short period of time. A pain treatment plan is an agreement between you and your health care provider. Discuss the goals of your treatment, including how much pain you might expect to have and how you will manage the pain. If you think that you or someone else may have taken too much of an opioid, get medical help right away. This information is not intended to replace advice given to you by your health care provider. Make sure you discuss any questions you have with your health care provider. Document Revised: 04/09/2021 Document Reviewed: 12/25/2020 Elsevier Patient Education  2024 [employer] Inc.  Advanced directives: Please bring a copy of your health care power of attorney and living will to the office to be added to your chart at your convenience.   Conditions/risks identified: None  Next appointment: Follow up in one year for your annual wellness visit.   Preventive Care 73 Years and Older, Male  Preventive care refers to lifestyle  choices and visits with your health care provider that can promote health and wellness. What does preventive care include? A yearly physical exam. This is also called an annual well check. Dental exams once or twice a year. Routine eye exams. Ask your health care provider how often you should have your eyes checked. Personal lifestyle choices, including: Daily care of your teeth and gums. Regular physical activity. Eating a healthy diet. Avoiding tobacco and drug use. Limiting alcohol use. Practicing safe sex. Taking low doses of aspirin every day. Taking vitamin and mineral supplements as recommended by your  health care provider. What happens during an annual well check? The services and screenings done by your health care provider during your annual well check will depend on your age, overall health, lifestyle risk factors, and family history of disease. Counseling  Your health care provider may ask you questions about your: Alcohol use. Tobacco use. Drug use. Emotional well-being. Home and relationship well-being. Sexual activity. Eating habits. History of falls. Memory and ability to understand (cognition). Work and work Astronomer. Screening  You may have the following tests or measurements: Height, weight, and BMI. Blood pressure. Lipid and cholesterol levels. These may be checked every 5 years, or more frequently if you are over 71 years old. Skin check. Lung cancer screening. You may have this screening every year starting at age 12 if you have a 30-pack-year history of smoking and currently smoke or have quit within the past 15 years. Fecal occult blood test (FOBT) of the stool. You may have this test every year starting at age 72. Flexible sigmoidoscopy or colonoscopy. You may have a sigmoidoscopy every 5 years or a colonoscopy every 10 years starting at age 44. Prostate cancer screening. Recommendations will vary depending on your family history and other  risks. Hepatitis C blood test. Hepatitis B blood test. Sexually transmitted disease (STD) testing. Diabetes screening. This is done by checking your blood sugar (glucose) after you have not eaten for a while (fasting). You may have this done every 1-3 years. Abdominal aortic aneurysm (AAA) screening. You may need this if you are a current or former smoker. Osteoporosis. You may be screened starting at age 59 if you are at high risk. Talk with your health care provider about your test results, treatment options, and if necessary, the need for more tests. Vaccines  Your health care provider may recommend certain vaccines, such as: Influenza vaccine. This is recommended every year. Tetanus, diphtheria, and acellular pertussis (Tdap, Td) vaccine. You may need a Td booster every 10 years. Zoster vaccine. You may need this after age 87. Pneumococcal 13-valent conjugate (PCV13) vaccine. One dose is recommended after age 47. Pneumococcal polysaccharide (PPSV23) vaccine. One dose is recommended after age 48. Talk to your health care provider about which screenings and vaccines you need and how often you need them. This information is not intended to replace advice given to you by your health care provider. Make sure you discuss any questions you have with your health care provider. Document Released: 10/11/2015 Document Revised: 06/03/2016 Document Reviewed: 07/16/2015 Elsevier Interactive Patient Education  2017 [employer].  Fall Prevention in the Home Falls can cause injuries. They can happen to people of all ages. There are many things you can do to make your home safe and to help prevent falls. What can I do on the outside of my home? Regularly fix the edges of walkways and driveways and fix any cracks. Remove anything that might make you trip as you walk through a door, such as a raised step or threshold. Trim any bushes or trees on the path to your home. Use bright outdoor lighting. Clear  any walking paths of anything that might make someone trip, such as rocks or tools. Regularly check to see if handrails are loose or broken. Make sure that both sides of any steps have handrails. Any raised decks and porches should have guardrails on the edges. Have any leaves, snow, or ice cleared regularly. Use sand or salt on walking paths during winter. Clean up any spills in your garage right  away. This includes oil or grease spills. What can I do in the bathroom? Use night lights. Install grab bars by the toilet and in the tub and shower. Do not use towel bars as grab bars. Use non-skid mats or decals in the tub or shower. If you need to sit down in the shower, use a plastic, non-slip stool. Keep the floor dry. Clean up any water that spills on the floor as soon as it happens. Remove soap buildup in the tub or shower regularly. Attach bath mats securely with double-sided non-slip rug tape. Do not have throw rugs and other things on the floor that can make you trip. What can I do in the bedroom? Use night lights. Make sure that you have a light by your bed that is easy to reach. Do not use any sheets or blankets that are too big for your bed. They should not hang down onto the floor. Have a firm chair that has side arms. You can use this for support while you get dressed. Do not have throw rugs and other things on the floor that can make you trip. What can I do in the kitchen? Clean up any spills right away. Avoid walking on wet floors. Keep items that you use a lot in easy-to-reach places. If you need to reach something above you, use a strong step stool that has a grab bar. Keep electrical cords out of the way. Do not use floor polish or wax that makes floors slippery. If you must use wax, use non-skid floor wax. Do not have throw rugs and other things on the floor that can make you trip. What can I do with my stairs? Do not leave any items on the stairs. Make sure that there  are handrails on both sides of the stairs and use them. Fix handrails that are broken or loose. Make sure that handrails are as long as the stairways. Check any carpeting to make sure that it is firmly attached to the stairs. Fix any carpet that is loose or worn. Avoid having throw rugs at the top or bottom of the stairs. If you do have throw rugs, attach them to the floor with carpet tape. Make sure that you have a light switch at the top of the stairs and the bottom of the stairs. If you do not have them, ask someone to add them for you. What else can I do to help prevent falls? Wear shoes that: Do not have high heels. Have rubber bottoms. Are comfortable and fit you well. Are closed at the toe. Do not wear sandals. If you use a stepladder: Make sure that it is fully opened. Do not climb a closed stepladder. Make sure that both sides of the stepladder are locked into place. Ask someone to hold it for you, if possible. Clearly mark and make sure that you can see: Any grab bars or handrails. First and last steps. Where the edge of each step is. Use tools that help you move around (mobility aids) if they are needed. These include: Canes. Walkers. Scooters. Crutches. Turn on the lights when you go into a dark area. Replace any light bulbs as soon as they burn out. Set up your furniture so you have a clear path. Avoid moving your furniture around. If any of your floors are uneven, fix them. If there are any pets around you, be aware of where they are. Review your medicines with your doctor. Some medicines can make you feel  dizzy. This can increase your chance of falling. Ask your doctor what other things that you can do to help prevent falls. This information is not intended to replace advice given to you by your health care provider. Make sure you discuss any questions you have with your health care provider. Document Released: 07/11/2009 Document Revised: 02/20/2016 Document Reviewed:  10/19/2014 Elsevier Interactive Patient Education  2017 [employer].

## 2023-03-25 ENCOUNTER — Encounter: Payer: Self-pay | Admitting: Dietician

## 2023-03-26 DIAGNOSIS — E1142 Type 2 diabetes mellitus with diabetic polyneuropathy: Secondary | ICD-10-CM | POA: Diagnosis not present

## 2023-03-26 DIAGNOSIS — I739 Peripheral vascular disease, unspecified: Secondary | ICD-10-CM | POA: Diagnosis not present

## 2023-03-26 DIAGNOSIS — B351 Tinea unguium: Secondary | ICD-10-CM | POA: Diagnosis not present

## 2023-03-26 DIAGNOSIS — L989 Disorder of the skin and subcutaneous tissue, unspecified: Secondary | ICD-10-CM | POA: Diagnosis not present

## 2023-03-26 DIAGNOSIS — M205X2 Other deformities of toe(s) (acquired), left foot: Secondary | ICD-10-CM | POA: Diagnosis not present

## 2023-03-26 DIAGNOSIS — L309 Dermatitis, unspecified: Secondary | ICD-10-CM | POA: Diagnosis not present

## 2023-03-26 DIAGNOSIS — L609 Nail disorder, unspecified: Secondary | ICD-10-CM | POA: Diagnosis not present

## 2023-03-26 DIAGNOSIS — M792 Neuralgia and neuritis, unspecified: Secondary | ICD-10-CM | POA: Diagnosis not present

## 2023-03-26 DIAGNOSIS — M205X1 Other deformities of toe(s) (acquired), right foot: Secondary | ICD-10-CM | POA: Diagnosis not present

## 2023-03-26 MED ORDER — OZEMPIC (2 MG/DOSE) 8 MG/3ML ~~LOC~~ SOPN: PEN_INJECTOR | SUBCUTANEOUS | 1 refills | Status: DC

## 2023-03-31 NOTE — Progress Notes (Signed)
[facility] Attending  Case discussed with the resident.  I reviewed the AWV findings.  I agree with the assessment, diagnosis, and plan of care documented in the AWV note.     

## 2023-04-08 DIAGNOSIS — H5213 Myopia, bilateral: Secondary | ICD-10-CM | POA: Diagnosis not present

## 2023-04-08 DIAGNOSIS — H04123 Dry eye syndrome of bilateral lacrimal glands: Secondary | ICD-10-CM | POA: Diagnosis not present

## 2023-04-08 DIAGNOSIS — E119 Type 2 diabetes mellitus without complications: Secondary | ICD-10-CM | POA: Diagnosis not present

## 2023-04-08 DIAGNOSIS — L601 Onycholysis: Secondary | ICD-10-CM | POA: Diagnosis not present

## 2023-04-08 DIAGNOSIS — H2513 Age-related nuclear cataract, bilateral: Secondary | ICD-10-CM | POA: Diagnosis not present

## 2023-04-08 DIAGNOSIS — H33322 Round hole, left eye: Secondary | ICD-10-CM | POA: Diagnosis not present

## 2023-04-08 DIAGNOSIS — H524 Presbyopia: Secondary | ICD-10-CM | POA: Diagnosis not present

## 2023-04-08 DIAGNOSIS — H52223 Regular astigmatism, bilateral: Secondary | ICD-10-CM | POA: Diagnosis not present

## 2023-04-08 DIAGNOSIS — H02056 Trichiasis without entropian left eye, unspecified eyelid: Secondary | ICD-10-CM | POA: Diagnosis not present

## 2023-04-12 ENCOUNTER — Other Ambulatory Visit: Payer: Self-pay | Admitting: Internal Medicine

## 2023-04-12 DIAGNOSIS — K219 Gastro-esophageal reflux disease without esophagitis: Secondary | ICD-10-CM

## 2023-04-27 DIAGNOSIS — H0232 Blepharochalasis right lower eyelid: Secondary | ICD-10-CM | POA: Diagnosis not present

## 2023-04-27 DIAGNOSIS — H0234 Blepharochalasis left upper eyelid: Secondary | ICD-10-CM | POA: Diagnosis not present

## 2023-04-27 DIAGNOSIS — Z09 Encounter for follow-up examination after completed treatment for conditions other than malignant neoplasm: Secondary | ICD-10-CM | POA: Diagnosis not present

## 2023-04-27 DIAGNOSIS — H0279 Other degenerative disorders of eyelid and periocular area: Secondary | ICD-10-CM | POA: Diagnosis not present

## 2023-04-27 DIAGNOSIS — H0231 Blepharochalasis right upper eyelid: Secondary | ICD-10-CM | POA: Diagnosis not present

## 2023-04-27 DIAGNOSIS — H0235 Blepharochalasis left lower eyelid: Secondary | ICD-10-CM | POA: Diagnosis not present

## 2023-04-30 ENCOUNTER — Encounter: Payer: Self-pay | Admitting: Student

## 2023-04-30 DIAGNOSIS — Z79891 Long term (current) use of opiate analgesic: Secondary | ICD-10-CM

## 2023-05-03 MED ORDER — HYDROCODONE-ACETAMINOPHEN 7.5-325 MG PO TABS: 1.0000 | ORAL_TABLET | Freq: Two times a day (BID) | ORAL | 0 refills | Status: DC | PRN

## 2023-05-03 NOTE — Telephone Encounter (Signed)
Last rx written - 02/25/23. Last OV - 03/11/23. Next OV - has not been scheduled. TOX - 03/11/23.

## 2023-05-16 ENCOUNTER — Other Ambulatory Visit: Payer: Self-pay | Admitting: Physician Assistant

## 2023-05-20 ENCOUNTER — Other Ambulatory Visit: Payer: Self-pay

## 2023-05-20 DIAGNOSIS — E11618 Type 2 diabetes mellitus with other diabetic arthropathy: Secondary | ICD-10-CM

## 2023-05-20 MED ORDER — OZEMPIC (2 MG/DOSE) 8 MG/3ML ~~LOC~~ SOPN: 2.0000 mg | PEN_INJECTOR | SUBCUTANEOUS | 1 refills | Status: DC

## 2023-05-27 ENCOUNTER — Other Ambulatory Visit: Payer: Self-pay | Admitting: Internal Medicine

## 2023-05-27 ENCOUNTER — Encounter: Payer: Self-pay | Admitting: Cardiovascular Disease

## 2023-05-27 ENCOUNTER — Other Ambulatory Visit: Payer: Self-pay | Admitting: Oncology

## 2023-05-27 DIAGNOSIS — N401 Enlarged prostate with lower urinary tract symptoms: Secondary | ICD-10-CM

## 2023-05-27 DIAGNOSIS — Z006 Encounter for examination for normal comparison and control in clinical research program: Secondary | ICD-10-CM

## 2023-05-27 MED ORDER — TADALAFIL 5 MG PO TABS: 5.0000 mg | ORAL_TABLET | Freq: Every day | ORAL | 0 refills | Status: DC

## 2023-05-28 ENCOUNTER — Other Ambulatory Visit (HOSPITAL_COMMUNITY)
Admission: RE | Admit: 2023-05-28 | Discharge: 2023-05-28 | Disposition: A | Discharge status: Home / Self Care | Payer: Medicare HMO | Source: Ambulatory Visit | Attending: Oncology | Admitting: Oncology

## 2023-05-28 DIAGNOSIS — Z006 Encounter for examination for normal comparison and control in clinical research program: Secondary | ICD-10-CM | POA: Insufficient documentation

## 2023-06-04 ENCOUNTER — Emergency Department (HOSPITAL_BASED_OUTPATIENT_CLINIC_OR_DEPARTMENT_OTHER)
Admission: EM | Admit: 2023-06-04 | Discharge: 2023-06-04 | Disposition: A | Discharge status: Home / Self Care | Payer: Medicare HMO | Attending: Emergency Medicine | Admitting: Emergency Medicine

## 2023-06-04 ENCOUNTER — Encounter (HOSPITAL_BASED_OUTPATIENT_CLINIC_OR_DEPARTMENT_OTHER): Payer: Self-pay | Admitting: Emergency Medicine

## 2023-06-04 ENCOUNTER — Other Ambulatory Visit: Payer: Self-pay

## 2023-06-04 DIAGNOSIS — E113591 Type 2 diabetes mellitus with proliferative diabetic retinopathy without macular edema, right eye: Secondary | ICD-10-CM | POA: Diagnosis not present

## 2023-06-04 DIAGNOSIS — M545 Low back pain, unspecified: Secondary | ICD-10-CM | POA: Diagnosis not present

## 2023-06-04 DIAGNOSIS — Z7901 Long term (current) use of anticoagulants: Secondary | ICD-10-CM | POA: Insufficient documentation

## 2023-06-04 DIAGNOSIS — X501XXA Overexertion from prolonged static or awkward postures, initial encounter: Secondary | ICD-10-CM | POA: Diagnosis not present

## 2023-06-04 DIAGNOSIS — Z7982 Long term (current) use of aspirin: Secondary | ICD-10-CM | POA: Diagnosis not present

## 2023-06-04 DIAGNOSIS — M5459 Other low back pain: Secondary | ICD-10-CM | POA: Diagnosis not present

## 2023-06-04 MED ORDER — CYCLOBENZAPRINE HCL 10 MG PO TABS: 10.0000 mg | ORAL_TABLET | Freq: Two times a day (BID) | ORAL | 0 refills | Status: DC | PRN

## 2023-06-04 MED ORDER — METHYLPREDNISOLONE 4 MG PO TBPK: ORAL_TABLET | ORAL | 0 refills | Status: DC

## 2023-06-04 NOTE — Discharge Instructions (Signed)
Take Tylenol for pain.  Take Medrol Dosepak as prescribed.  I have prescribed you a muscle relaxant called Flexeril that is sedating.  Please be careful with its use.  Do not drive or do dangerous activities.  Adjust your diabetes meds as you need to maintain good blood sugars as we discussed.  Follow-up with your primary care doctor.  Return if symptoms worsen.

## 2023-06-04 NOTE — ED Triage Notes (Signed)
Back pain, started at work tuesday Fast twisting movement. Middle back. Not relieved with pain meds, pain patch helped minimal. No numbness no incont

## 2023-06-04 NOTE — ED Notes (Signed)
Pt given discharge instructions and reviewed prescriptions. Opportunities given for questions. Pt verbalizes understanding. Stone,Heather R, RN 

## 2023-06-04 NOTE — ED Provider Notes (Signed)
[place] EMERGENCY DEPARTMENT AT [facility] Provider Note   CSN: [id number] Arrival date & time: 06/04/23  1515     History  Chief Complaint  Patient presents with   Back Pain    Riley Jones is a 67 y.o. male.  Patient here with low back pain.  Worse today.  Injured it he thinks twisting his back funny the other day.  Nothing makes it worse or better.  Denies any weakness numbness tingling.  No loss of bowel or bladder.  History of diabetes.  Denies any chest pain shortness of breath weakness numbness tingling otherwise.  The history is provided by the patient.       Home Medications Prior to Admission medications   Medication Sig Start Date End Date Taking? Authorizing Provider  cyclobenzaprine (FLEXERIL) 10 MG tablet Take 1 tablet (10 mg total) by mouth 2 (two) times daily as needed for muscle spasms. 06/04/23  Yes Kayle Correa, DO  methylPREDNISolone (MEDROL DOSEPAK) 4 MG TBPK tablet Follow package insert 06/04/23  Yes Robbert Langlinais, DO  albuterol (VENTOLIN HFA) 108 (90 Base) MCG/ACT inhaler Inhale 2 puffs into the lungs every 6 (six) hours as needed for wheezing or shortness of breath. 09/30/21   Miguel Aschoff, MD  aspirin 81 MG EC tablet Take 81 mg by mouth daily.      [provider]  atorvastatin (LIPITOR) 40 MG tablet Take 1 tablet (40 mg total) by mouth daily. 04/07/22   Earl Lagos, MD  bictegravir-emtricitabine-tenofovir AF (BIKTARVY) 50-200-25 MG TABS tablet Take 1 tablet by mouth once daily 03/25/23   Kuppelweiser, Cassie L, RPH-CPP  carvedilol (COREG) 3.125 MG tablet Take 1 tablet (3.125 mg total) by mouth 2 (two) times daily with a meal. 11/12/22   Mikey Bussing, Marthenia Rolling, DO  ciclopirox (PENLAC) 8 % solution Apply topically at bedtime. Apply over nail and surrounding skin. Apply daily over previous coat. After seven (7) days, may remove with alcohol and continue cycle.    [provider]  clopidogrel (PLAVIX) 75 MG tablet TAKE 1 TABLET  BY MOUTH EVERY DAY 05/17/23   Sharlene Dory, PA-C  clotrimazole-betamethasone (LOTRISONE) cream Apply 1 Application topically 2 (two) times daily.    [provider]  Dermatological Products, Misc. (KERASAL FUNGAL NAIL RENEWAL EX) Apply topically.    [provider]  Empagliflozin-metFORMIN HCl ER (SYNJARDY XR) 12.01-999 MG TB24 Take 1 tablet by mouth 2 (two) times daily. 11/12/22   Gust Rung, DO  fluticasone (FLONASE) 50 MCG/ACT nasal spray Place 1 spray into both nostrils daily. 08/24/17   Earl Lagos, MD  HYDROcodone-acetaminophen (NORCO) 7.5-325 MG tablet Take 1 tablet by mouth 2 (two) times daily as needed for moderate pain. 05/03/23   Gust Rung, DO  hydrOXYzine (ATARAX) 10 MG tablet TAKE 1 TABLET BY MOUTH THREE TIMES DAILY AS NEEDED FOR ITCHING 12/31/21   Earl Lagos, MD  insulin glargine (LANTUS SOLOSTAR) 100 UNIT/ML Solostar Pen Inject 50-65 Units into the skin daily at 10 pm. 03/11/23   Gust Rung, DO  Insulin Pen Needle (PEN NEEDLES) 32G X 6 MM MISC 1 Units by Does not apply route daily. 11/12/22   Gust Rung, DO  loratadine (CLARITIN) 10 MG tablet Take 1 tablet (10 mg total) by mouth daily. 12/01/16   Earl Lagos, MD  losartan-hydrochlorothiazide (HYZAAR) 50-12.5 MG tablet Take 1 tablet by mouth daily. 11/12/22   Gust Rung, DO  Multiple Vitamins-Minerals (CENTRUM ADULTS PO) Take 1 tablet by mouth  daily.    [provider]  nitroGLYCERIN (NITROSTAT) 0.4 MG SL tablet PLACE 1 TABLET UNDER THE TONGUE EVERY 5 MINUTES AS NEEDED FOR CHEST PAIN 02/05/22   Earl Lagos, MD  omeprazole (PRILOSEC) 20 MG capsule TAKE 1 CAPSULE BY MOUTH AS NEEDED 04/12/23   Gust Rung, DO  Semaglutide, 2 MG/DOSE, (OZEMPIC, 2 MG/DOSE,) 8 MG/3ML SOPN INJECT 2MG  INTO SKIN ONCE WEEKLY 03/26/23   Gust Rung, DO  Semaglutide, 2 MG/DOSE, (OZEMPIC, 2 MG/DOSE,) 8 MG/3ML SOPN Inject 2 mg into the skin once a week. 05/20/23   Gust Rung, DO  tadalafil  (CIALIS) 5 MG tablet Take 1 tablet (5 mg total) by mouth daily. 05/27/23   Gust Rung, DO  terazosin (HYTRIN) 2 MG capsule Take 1 capsule (2 mg total) by mouth at bedtime. 04/20/22   Earl Lagos, MD  Testosterone 1.62 % GEL Place 2 Pump onto the skin daily. Each shoulder    [provider]  triamcinolone cream (KENALOG) 0.1 % Apply 1 Application topically 2 (two) times daily.    [provider]      Allergies    Penicillins, Zoloft [sertraline hcl], Victoza [liraglutide], Chantix [varenicline], Gabapentin, Lisinopril, Ozempic (0.25 or 0.5 mg-dose) [semaglutide(0.25 or 0.5mg -dos)], and Tresiba [insulin degludec]    Review of Systems   Review of Systems  Physical Exam Updated Vital Signs BP (!) 136/94 (BP Location: Right Arm)   Pulse 83   Temp 98.9 F (37.2 C)   Resp 16   SpO2 97%  Physical Exam Vitals and nursing note reviewed.  Constitutional:      General: He is not in acute distress.    Appearance: He is well-developed.  HENT:     Head: Normocephalic and atraumatic.  Eyes:     Extraocular Movements: Extraocular movements intact.     Conjunctiva/sclera: Conjunctivae normal.     Pupils: Pupils are equal, round, and reactive to light.  Cardiovascular:     Rate and Rhythm: Normal rate and regular rhythm.     Pulses: Normal pulses.     Heart sounds: Normal heart sounds. No murmur heard. Pulmonary:     Effort: Pulmonary effort is normal. No respiratory distress.     Breath sounds: Normal breath sounds.  Abdominal:     Palpations: Abdomen is soft.     Tenderness: There is no abdominal tenderness.  Musculoskeletal:        General: Tenderness present. No swelling.     Cervical back: Neck supple.     Comments: Tenderness to the paraspinal lumbar muscles on the left, no midline spinal tenderness  Skin:    General: Skin is warm and dry.     Capillary Refill: Capillary refill takes less than 2 seconds.  Neurological:     General: No focal deficit  present.     Mental Status: He is alert and oriented to person, place, and time.     Cranial Nerves: No cranial nerve deficit.     Sensory: No sensory deficit.     Motor: No weakness.     Coordination: Coordination normal.     Comments: 5+ out of 5 strength throughout, normal sensation, no drift, normal finger-nose-finger, speech  Psychiatric:        Mood and Affect: Mood normal.     ED Results / Procedures / Treatments   Labs (all labs ordered are listed, but only abnormal results are displayed) Labs Reviewed - No data to display  EKG None  Radiology No  results found.  Procedures Procedures    Medications Ordered in ED Medications - No data to display  ED Course/ Medical Decision Making/ A&P                                 Medical Decision Making Risk Prescription drug management.   Velda Shell is here with back pain.  History of diabetes.  Hurt his back twisted funny the other day.  Having local left lower back pain.  Tender on exam.  He is got normal strength and sensation.  Normal pulses.  Have no concern for neurovascular process.  Notes some concern for trauma.  Disc appears consistent with muscle spasm.  He is not having any sciatic symptoms.  He is not having any urinary retention or bowel incontinence.  Have no concern for cauda equina.  Overall he is a diabetic but he does feel comfortable making adjustments for his blood sugar as we we will do some Medrol Dosepak.  Will do Flexeril.  Recommend Tylenol.  Recommend lidocaine patch.  Recommend follow-up with primary care doctor.  Discharged in good condition.  Understands return precautions.  This chart was dictated using voice recognition software.  Despite best efforts to proofread,  errors can occur which can change the documentation meaning.         Final Clinical Impression(s) / ED Diagnoses Final diagnoses:  Acute low back pain without sciatica, unspecified back pain laterality    Rx / DC  Orders ED Discharge Orders          Ordered    methylPREDNISolone (MEDROL DOSEPAK) 4 MG TBPK tablet        06/04/23 1644    cyclobenzaprine (FLEXERIL) 10 MG tablet  2 times daily PRN        06/04/23 1644              Orlando Devereux, DO 06/04/23 1646

## 2023-06-06 DIAGNOSIS — E1165 Type 2 diabetes mellitus with hyperglycemia: Secondary | ICD-10-CM | POA: Diagnosis not present

## 2023-06-07 ENCOUNTER — Encounter: Payer: Self-pay | Admitting: Vascular Surgery

## 2023-06-07 DIAGNOSIS — E290 Testicular hyperfunction: Secondary | ICD-10-CM | POA: Diagnosis not present

## 2023-06-07 DIAGNOSIS — Z794 Long term (current) use of insulin: Secondary | ICD-10-CM

## 2023-06-07 DIAGNOSIS — R948 Abnormal results of function studies of other organs and systems: Secondary | ICD-10-CM | POA: Diagnosis not present

## 2023-06-07 DIAGNOSIS — Z79891 Long term (current) use of opiate analgesic: Secondary | ICD-10-CM

## 2023-06-07 MED ORDER — LANTUS SOLOSTAR 100 UNIT/ML ~~LOC~~ SOPN: 50.0000 [IU] | PEN_INJECTOR | Freq: Every day | SUBCUTANEOUS | 3 refills | Status: DC

## 2023-06-11 ENCOUNTER — Other Ambulatory Visit: Payer: Self-pay | Admitting: Pharmacist

## 2023-06-11 DIAGNOSIS — B20 Human immunodeficiency virus [HIV] disease: Secondary | ICD-10-CM

## 2023-06-11 MED ORDER — HYDROCODONE-ACETAMINOPHEN 7.5-325 MG PO TABS: 1.0000 | ORAL_TABLET | Freq: Two times a day (BID) | ORAL | 0 refills | Status: DC | PRN

## 2023-06-13 ENCOUNTER — Other Ambulatory Visit: Payer: Self-pay | Admitting: Internal Medicine

## 2023-06-13 DIAGNOSIS — E7800 Pure hypercholesterolemia, unspecified: Secondary | ICD-10-CM

## 2023-06-14 LAB — GENECONNECT MOLECULAR SCREEN: Genetic Analysis Overall Interpretation: NEGATIVE

## 2023-06-14 LAB — HELIX MOLECULAR SCREEN

## 2023-06-15 ENCOUNTER — Other Ambulatory Visit: Payer: Medicare HMO

## 2023-06-15 ENCOUNTER — Other Ambulatory Visit: Payer: Self-pay

## 2023-06-15 ENCOUNTER — Other Ambulatory Visit (HOSPITAL_COMMUNITY)
Admission: RE | Admit: 2023-06-15 | Discharge: 2023-06-15 | Disposition: A | Discharge status: Home / Self Care | Payer: Medicare HMO | Source: Ambulatory Visit | Attending: Internal Medicine | Admitting: Internal Medicine

## 2023-06-15 DIAGNOSIS — Z79899 Other long term (current) drug therapy: Secondary | ICD-10-CM | POA: Diagnosis not present

## 2023-06-15 DIAGNOSIS — B20 Human immunodeficiency virus [HIV] disease: Secondary | ICD-10-CM | POA: Diagnosis not present

## 2023-06-15 DIAGNOSIS — Z113 Encounter for screening for infections with a predominantly sexual mode of transmission: Secondary | ICD-10-CM

## 2023-06-15 NOTE — Addendum Note (Signed)
Addended by: Juanita Laster on: 06/15/2023 08:33 AM   Modules accepted: Orders

## 2023-06-16 DIAGNOSIS — N401 Enlarged prostate with lower urinary tract symptoms: Secondary | ICD-10-CM | POA: Diagnosis not present

## 2023-06-16 DIAGNOSIS — R3912 Poor urinary stream: Secondary | ICD-10-CM | POA: Diagnosis not present

## 2023-06-16 DIAGNOSIS — E291 Testicular hypofunction: Secondary | ICD-10-CM | POA: Diagnosis not present

## 2023-06-16 DIAGNOSIS — N5201 Erectile dysfunction due to arterial insufficiency: Secondary | ICD-10-CM | POA: Diagnosis not present

## 2023-06-17 LAB — LIPID PANEL
Cholesterol: 154 mg/dL (ref <200)
HDL: 51 mg/dL (ref >=40)
LDL Cholesterol (Calc): 78 mg/dL (calc)
Non-HDL Cholesterol (Calc): 103 mg/dL (calc) (ref <130)
Total CHOL/HDL Ratio: 3 (calc) (ref <5.0)
Triglycerides: 150 mg/dL — ABNORMAL HIGH (ref <150)

## 2023-06-17 LAB — COMPLETE METABOLIC PANEL WITH GFR
AG Ratio: 1.8 (calc) (ref 1.0–2.5)
ALT: 22 U/L (ref 9–46)
AST: 21 U/L (ref 10–35)
Albumin: 4.5 g/dL (ref 3.6–5.1)
Alkaline phosphatase (APISO): 67 U/L (ref 35–144)
BUN: 15 mg/dL (ref 7–25)
CO2: 27 mmol/L (ref 20–32)
Calcium: 10.6 mg/dL — ABNORMAL HIGH (ref 8.6–10.3)
Chloride: 99 mmol/L (ref 98–110)
Creat: 0.89 mg/dL (ref 0.70–1.35)
Globulin: 2.5 g/dL (calc) (ref 1.9–3.7)
Glucose, Bld: 165 mg/dL — ABNORMAL HIGH (ref 65–99)
Potassium: 4.2 mmol/L (ref 3.5–5.3)
Sodium: 138 mmol/L (ref 135–146)
Total Bilirubin: 0.8 mg/dL (ref 0.2–1.2)
Total Protein: 7 g/dL (ref 6.1–8.1)
eGFR: 93 mL/min/{1.73_m2} (ref >=60)

## 2023-06-17 LAB — CBC WITH DIFFERENTIAL/PLATELET
Absolute Monocytes: 583 cells/uL (ref 200–950)
Basophils Absolute: 62 cells/uL (ref 0–200)
Basophils Relative: 1 %
Eosinophils Absolute: 167 cells/uL (ref 15–500)
Eosinophils Relative: 2.7 %
HCT: 47 % (ref 38.5–50.0)
Hemoglobin: 15.3 g/dL (ref 13.2–17.1)
Lymphs Abs: 1445 cells/uL (ref 850–3900)
MCH: 29.7 pg (ref 27.0–33.0)
MCHC: 32.6 g/dL (ref 32.0–36.0)
MCV: 91.3 fL (ref 80.0–100.0)
MPV: 11 fL (ref 7.5–12.5)
Monocytes Relative: 9.4 %
Neutro Abs: 3943 cells/uL (ref 1500–7800)
Neutrophils Relative %: 63.6 %
Platelets: 46 10*3/uL — ABNORMAL LOW (ref 140–400)
RBC: 5.15 10*6/uL (ref 4.20–5.80)
RDW: 13.5 % (ref 11.0–15.0)
Total Lymphocyte: 23.3 %
WBC: 6.2 10*3/uL (ref 3.8–10.8)

## 2023-06-17 LAB — HIV-1 RNA QUANT-NO REFLEX-BLD
HIV 1 RNA Quant: 45 Copies/mL — ABNORMAL HIGH
HIV-1 RNA Quant, Log: 1.65 Log cps/mL — ABNORMAL HIGH

## 2023-06-17 LAB — RPR: RPR Ser Ql: NONREACTIVE

## 2023-06-25 ENCOUNTER — Other Ambulatory Visit (HOSPITAL_COMMUNITY): Payer: Self-pay

## 2023-06-25 ENCOUNTER — Telehealth: Payer: Self-pay

## 2023-06-25 NOTE — Telephone Encounter (Signed)
[facility] Patient Product/process development scientist completed.    The patient is insured through [employer].   Biktarvy was last filled on 06/11/23.  We will continue to follow to see if copay assistance is needed.  Clearance Coots, CPhT Specialty Pharmacy Patient [facility] Phone: [phone] Fax:  [phone]

## 2023-06-29 ENCOUNTER — Telehealth: Payer: Self-pay

## 2023-06-29 ENCOUNTER — Ambulatory Visit: Payer: Medicare HMO | Admitting: Internal Medicine

## 2023-06-29 ENCOUNTER — Encounter: Payer: Self-pay | Admitting: Internal Medicine

## 2023-06-29 ENCOUNTER — Other Ambulatory Visit: Payer: Self-pay

## 2023-06-29 VITALS — BP 149/91 | HR 65 | Temp 98.4°F | Ht 70.0 in | Wt 202.0 lb

## 2023-06-29 DIAGNOSIS — B20 Human immunodeficiency virus [HIV] disease: Secondary | ICD-10-CM | POA: Diagnosis not present

## 2023-06-29 DIAGNOSIS — E1169 Type 2 diabetes mellitus with other specified complication: Secondary | ICD-10-CM

## 2023-06-29 DIAGNOSIS — D696 Thrombocytopenia, unspecified: Secondary | ICD-10-CM

## 2023-06-29 DIAGNOSIS — M069 Rheumatoid arthritis, unspecified: Secondary | ICD-10-CM

## 2023-06-29 NOTE — Progress Notes (Signed)
[facility]   CHIEF COMPLAINT    HIV follow up.   SUBJECTIVE:    Riley Jones is a 67 y.o. male with PMHx as below who presents to the clinic for HIV follow up.    06/29/23 id clinic f/u Patient previously sees dr Earlene Plater. First visit with me today  Saw ed 3 weeks ago for acute back pain given methylprednisone burst and flexeril -- twisted his back jumping back to avoid an object falling on him at work. Still painful not better. No lower ext neurological deficit  Has chronic left lower abd pain due to ozempic injection  He has no complaints Labs recently done and we reviewed Reviewed meds/medical problems -- dm2, RA, angina/cad Please see A&P for the details of today's visit and status of the patient's medical problems.   Patient's Medications  New Prescriptions   No medications on file  Previous Medications   ALBUTEROL (VENTOLIN HFA) 108 (90 BASE) MCG/ACT INHALER    Inhale 2 puffs into the lungs every 6 (six) hours as needed for wheezing or shortness of breath.   ASPIRIN 81 MG EC TABLET    Take 81 mg by mouth daily.     ATORVASTATIN (LIPITOR) 40 MG TABLET    Take 1 tablet by mouth once daily   BIKTARVY 50-200-25 MG TABS TABLET    TAKE 1 TABLET BY MOUTH ONCE DAILY . APPOINTMENT REQUIRED FOR FUTURE REFILLS FOR  05/2023   CARVEDILOL (COREG) 3.125 MG TABLET    Take 1 tablet (3.125 mg total) by mouth 2 (two) times daily with a meal.   CICLOPIROX (PENLAC) 8 % SOLUTION    Apply topically at bedtime. Apply over nail and surrounding skin. Apply daily over previous coat. After seven (7) days, may remove with alcohol and continue cycle.   CLOPIDOGREL (PLAVIX) 75 MG TABLET    TAKE 1 TABLET BY MOUTH EVERY DAY   CLOTRIMAZOLE-BETAMETHASONE (LOTRISONE) CREAM    Apply 1 Application topically 2 (two) times daily.   CYCLOBENZAPRINE (FLEXERIL) 10 MG TABLET    Take 1 tablet (10 mg total) by mouth 2 (two) times daily as needed for muscle spasms.    DERMATOLOGICAL PRODUCTS, MISC. (KERASAL FUNGAL NAIL RENEWAL EX)    Apply topically.   EMPAGLIFLOZIN-METFORMIN HCL ER (SYNJARDY XR) 12.01-999 MG TB24    Take 1 tablet by mouth 2 (two) times daily.   FLUTICASONE (FLONASE) 50 MCG/ACT NASAL SPRAY    Place 1 spray into both nostrils daily.   HYDROCODONE-ACETAMINOPHEN (NORCO) 7.5-325 MG TABLET    Take 1 tablet by mouth 2 (two) times daily as needed for moderate pain.   HYDROXYZINE (ATARAX) 10 MG TABLET    TAKE 1 TABLET BY MOUTH THREE TIMES DAILY AS NEEDED FOR ITCHING   INSULIN GLARGINE (LANTUS SOLOSTAR) 100 UNIT/ML SOLOSTAR PEN    Inject 50-65 Units into the skin daily at 10 pm.   INSULIN PEN NEEDLE (PEN NEEDLES) 32G X 6 MM MISC    1 Units by Does not apply route daily.   LORATADINE (CLARITIN) 10 MG TABLET    Take 1 tablet (10 mg total) by mouth daily.   LOSARTAN-HYDROCHLOROTHIAZIDE (HYZAAR) 50-12.5 MG TABLET    Take 1 tablet by mouth daily.   METHYLPREDNISOLONE (MEDROL DOSEPAK) 4 MG TBPK TABLET    Follow package insert   MULTIPLE VITAMINS-MINERALS (CENTRUM ADULTS PO)    Take 1 tablet by mouth daily.   NITROGLYCERIN (NITROSTAT) 0.4 MG SL TABLET  PLACE 1 TABLET UNDER THE TONGUE EVERY 5 MINUTES AS NEEDED FOR CHEST PAIN   OMEPRAZOLE (PRILOSEC) 20 MG CAPSULE    TAKE 1 CAPSULE BY MOUTH AS NEEDED   SEMAGLUTIDE, 2 MG/DOSE, (OZEMPIC, 2 MG/DOSE,) 8 MG/3ML SOPN    INJECT 2MG  INTO SKIN ONCE WEEKLY   SEMAGLUTIDE, 2 MG/DOSE, (OZEMPIC, 2 MG/DOSE,) 8 MG/3ML SOPN    Inject 2 mg into the skin once a week.   TADALAFIL (CIALIS) 5 MG TABLET    Take 1 tablet (5 mg total) by mouth daily.   TERAZOSIN (HYTRIN) 2 MG CAPSULE    Take 1 capsule (2 mg total) by mouth at bedtime.   TESTOSTERONE 1.62 % GEL    Place 2 Pump onto the skin daily. Each shoulder   TRIAMCINOLONE CREAM (KENALOG) 0.1 %    Apply 1 Application topically 2 (two) times daily.  Modified Medications   No medications on file  Discontinued Medications   No medications on file      Past Medical History:   Diagnosis Date   Anxiety    Arthritis    "pretty much all over"   Asthma    "mild" (07/17/2014)   Atypical angina (HCC)    Myoview 2003, Normal EF 64%   CAD (coronary artery disease)    a. 10/20 DES to 80% mid LAD   Candida rash of groin 11/03/2019   COPD (chronic obstructive pulmonary disease) (HCC)    "mild" (07/17/2014)   Depression    Fatigue 06/22/2019   Foot pain 03/30/2011   GERD (gastroesophageal reflux disease)    H/O hiatal hernia    Headache    "weekly" (06/2014)   History of gout    HIV (human immunodeficiency virus infection) (HCC) 1995   DX after shingles followed by Dr. Sampson Goon (ID)   Hx of cardiovascular stress test    ETT-Myoview (3/16):  Ex 10:15, No Ischemia, EF 57%; Normal Study   Hyperlipidemia    Hypertension    Well Controlled off meds   Increased anion gap metabolic acidosis 01/22/2021   Kidney stones    "passed them all"   Left lower quadrant abdominal pain 11/02/2019   Other and unspecified angina pectoris 12/18/2008   Overview:  Overview:  Greatly appreciate PCP f/u.   Last Assessment & Plan:  - Patient with episode of CP - 4 days, constant, substernal, not immediately resolved with NTG, felt better after burping and passing gas - Repeat EKG done here with no acute ST/T wave changes - CP now completely resolved. Encouraged patient to call 911 if recurs - To f/u with cardio in the next 2 weeks for further assess   Pneumonia 4-5 times   Rheumatoid arthritis(714.0)    Followed by Dr. 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   Sleep apnea    does not wear mask (07/17/2014)   Type II diabetes mellitus (HCC)     Social History   Tobacco Use   Smoking status: Former    Current packs/day: 0.00    Average packs/day: 1 pack/day for 46.0 years (46.0 ttl pk-yrs)    Types: Cigarettes    Start date: 12/28/1975    Quit date: 12/27/2021    Years  since quitting: 1.5   Smokeless tobacco: Never  Vaping Use   Vaping status: Never Used  Substance Use Topics   Alcohol use: Not Currently  Alcohol/week: 1.0 standard drink of alcohol    Types: 1 Glasses of wine per week    Comment: 07/17/2014 "glass of wine once or twice/month"   Drug use: No    Family History  Problem Relation Age of Onset   Osteoporosis Mother    Diabetes Mother    Heart disease Father    Heart attack Father    Diabetes Father    COPD Father    Heart attack Brother    Stroke Maternal Grandmother    Stroke Maternal Grandfather    Stroke Paternal Grandmother    Stroke Paternal Grandfather     Allergies  Allergen Reactions   Penicillins Hives, Nausea Only and Rash    Has patient had a PCN reaction causing immediate rash, facial/tongue/throat swelling, SOB or lightheadedness with hypotension: Yes Has patient had a PCN reaction causing severe rash involving mucus membranes or skin necrosis: No Has patient had a PCN reaction that required hospitalization: No Has patient had a PCN reaction occurring within the last 10 years: No If all of the above answers are "NO", then may proceed with Cephalosporin use.    Zoloft [Sertraline Hcl] Other (See Comments)    Patient reported Psychomotor slowing / worsened depression   Victoza [Liraglutide]     Did not tolerate this medication.  Complained of dizziness even at the lowest dose   Chantix [Varenicline] Hives   Gabapentin Itching   Lisinopril Cough   Ozempic (0.25 Or 0.5 Mg-Dose) [Semaglutide(0.25 Or 0.5mg -Dos)] Itching and Rash    Rash to chest wall.   Evaristo Bury [Insulin Degludec] Rash    Review of Systems  Constitutional: Negative.   Respiratory: Negative.    Cardiovascular: Negative.   Gastrointestinal: Negative.   Musculoskeletal:  Positive for joint pain.  Skin:  Positive for rash.     OBJECTIVE:    Vitals:   06/29/23 0856  Weight: 202 lb (91.6 kg)  Height: 5\' 10"  (1.778 m)     Body mass index  is 28.98 kg/m.  Physical Exam Constitutional:      Appearance: Normal appearance.  HENT:     Head: Normocephalic and atraumatic.  Eyes:     Extraocular Movements: Extraocular movements intact.     Conjunctiva/sclera: Conjunctivae normal.  Pulmonary:     Effort: Pulmonary effort is normal. No respiratory distress.  Abdominal:     General: There is no distension.     Palpations: Abdomen is soft.  Skin:    General: Skin is warm and dry.     Comments: Left lateral ankle with superficial scab about 1cm.  No drainage, redness, tenderness.  Appears c/w possible psoriatic plaque.   Neurological:     General: No focal deficit present.     Mental Status: He is alert and oriented to person, place, and time.  Psychiatric:        Mood and Affect: Mood normal.        Behavior: Behavior normal.     Labs and Microbiology:    Latest Ref Rng & Units 06/15/2023    8:36 AM 05/26/2022    9:12 AM 12/30/2021    9:40 AM  CMP  Glucose 65 - 99 mg/dL 696  295  284   BUN 7 - 25 mg/dL 15  14  26    Creatinine 0.70 - 1.35 mg/dL 1.32  4.40  1.02   Sodium 135 - 146 mmol/L 138  141  136   Potassium 3.5 - 5.3 mmol/L 4.2  4.6  4.5  Chloride 98 - 110 mmol/L 99  105  96   CO2 20 - 32 mmol/L 27  26  23    Calcium 8.6 - 10.3 mg/dL 13.0  86.5  9.7   Total Protein 6.1 - 8.1 g/dL 7.0  6.9    Total Bilirubin 0.2 - 1.2 mg/dL 0.8  0.6    AST 10 - 35 U/L 21  40    ALT 9 - 46 U/L 22  51        Latest Ref Rng & Units 06/15/2023    8:36 AM 08/12/2022   10:15 AM 05/26/2022    9:12 AM  CBC  WBC 3.8 - 10.8 Thousand/uL 6.2  5.6  5.5   Hemoglobin 13.2 - 17.1 g/dL 78.4  69.6  29.5   Hematocrit 38.5 - 50.0 % 47.0  42.7  43.1   Platelets 140 - 400 Thousand/uL 46  CANCELED  60  C    C Corrected result     Lab Results  Component Value Date   HIV1RNAQUANT 45 (H) 06/15/2023   HIV1RNAQUANT <20 (H) 05/26/2022   HIV1RNAQUANT <20 DETECTED (A) 01/30/2022   CD4TABS 583 06/15/2023   CD4TABS 704 05/26/2022   CD4TABS 848  06/11/2020    RPR and STI: Lab Results  Component Value Date   LABRPR NON-REACTIVE 06/15/2023   LABRPR NON-REACTIVE 05/26/2022   LABRPR NON-REACTIVE 05/16/2021   LABRPR NON-REACTIVE 06/11/2020   LABRPR NON-REACTIVE 10/25/2018    STI Results GC CT  06/15/2023  8:50 AM Negative  Negative   05/26/2022  9:30 AM Negative  Negative   08/08/2021 10:11 AM Negative  Negative   05/16/2021  2:34 PM Negative  Negative   06/11/2020  9:22 AM Negative  Negative   10/25/2018 12:00 AM Negative  Negative   01/19/2018 12:00 AM Negative  Negative   06/16/2017 12:00 AM Negative  Negative   03/02/2016 12:00 AM Negative  Negative     Hepatitis B: Lab Results  Component Value Date   HEPBSAB YES 11/22/2006   HEPBSAG NO 11/22/2006   Hepatitis C: No results found for: "HEPCAB", "HCVRNAPCRQN" Hepatitis A: Lab Results  Component Value Date   HAV REACTIVE (A) 01/30/2022   Lipids: Lab Results  Component Value Date   CHOL 154 06/15/2023   TRIG 150 (H) 06/15/2023   HDL 51 06/15/2023   CHOLHDL 3.0 06/15/2023   VLDL UNABLE TO CALCULATE IF TRIGLYCERIDE OVER 400 mg/dL 28/41/3244   LDLCALC 78 06/15/2023    Imaging: Reviewed:  03/2020 abd pelv ct without contrast FINDINGS: Lower chest: Lung bases are clear. There are foci of arterial vascular calcification. There is mitral annulus calcification.   Hepatobiliary: No focal liver lesions are evident. There is cholelithiasis. There is no appreciable gallbladder wall thickening. No biliary duct dilatation.   Pancreas: There is no pancreatic mass or inflammatory focus.   Spleen: No splenic lesions are evident.   Adrenals/Urinary Tract: Adrenals bilaterally appear normal. Kidneys bilaterally show no evident mass or hydronephrosis on either side. There is no evident renal or ureteral calculus on either side. Urinary bladder is midline with wall thickness within normal limits.   Stomach/Bowel: There is no appreciable bowel wall or  mesenteric thickening. Terminal ileum appears normal. There is mild lipomatous infiltration of the ileocecal valve. No evident bowel obstruction. There are occasional sigmoid diverticula without diverticulitis. No free air or portal venous air.   Vascular/Lymphatic: There is no abdominal aortic aneurysm. There is aortic and iliac artery atherosclerosis. No adenopathy is evident in  the abdomen or pelvis.   Reproductive: Prostate is upper normal in size with configuration within normal limits. There are a few tiny prostatic calculi. Seminal vesicles appear normal bilaterally. No evident pelvic mass.   Other: Postoperative mesh noted in the anterior pelvis. There is fat in each inguinal ring. Appendix absent. No periappendiceal region inflammation evident. No evident abscess or ascites in the abdomen or pelvis.   Musculoskeletal: There is degenerative change in the lumbar spine. There are no blastic or lytic bone lesions. No intramuscular lesions are evident.   IMPRESSION: 1.  Cholelithiasis.  No gallbladder wall thickening evident.   2. Occasional sigmoid diverticula without diverticulitis. No bowel wall thickening or bowel obstruction. No free air.   3. No renal or ureteral calculus. No hydronephrosis. Urinary bladder wall thickness normal.   4. Postoperative change anterior pelvic wall. Fat noted in each inguinal ring.   5. Aortic Atherosclerosis (ICD10-I70.0). Multiple iliac artery calcifications also noted. Foci of coronary artery calcification noted.   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 No ivdu; no msm risk.  Cd4 < 200 at dx; frequent pneumonia and hx pcp Started treatment right at dx Therapy: 2021-c Biktarvy Didn't like cabenuva due to side effect Prior atripla and other regimens he doesn't remember No history of regimen failure previously per his report   Compliant no missed dose last 4 weeks   -discussed u=u -encourage compliance -continue current HIV medication with biktarvy -labs 9 months during f/u -f/u in 9 months    #thrombocytopenia Present chronically since 2021 With his RA which he no longer takes any disease specific meds, query if autoimmune phenomenom related to it Abd pelv ct in 2021 showed no splenomegaly Hiv infection can be associated with itp but it is well controlled at this time on ART, not entirely clear if related at this time He is on plavix/aspirin for CAD and reports easy bruising prolonged bleeding No hx of bleeding or bleeding diasthesis in his family No prior hematology -- but pcp appears to have been monitoring   -he'll see his pcp in 2 months; advise him to discuss this with his pcp further   #acute back strain No red flag Still very painful Taking tylenol or hydrocodone as needed (for RA and use it for this)  -advise f/u pcp for further w/u if not better in 2-4 weeks    #RA Been off specific meds for years No morning stiffness and no significant joint pain/swelling   #Psorasis (confirmed bx left leg biopsy) -- nails are involved On topical agents    #dm2 #obesity On insulin and ozempic (since 2022) and synjardi Not well controlled Lab Results  Component Value Date   HGBA1C 9.4 (A) 03/11/2023   -f/u pcp   #social Lives alone Not in a relationship -- last sexually active "long time." 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

## 2023-06-29 NOTE — Telephone Encounter (Signed)
Transition Care Management Follow-up Telephone Call Date of discharge and from where: 06/04/2023 [facility] How have you been since you were released from the hospital? Patient stated he is still in a lot of pain. Any questions or concerns? No  Items Reviewed: Did the pt receive and understand the discharge instructions provided? Yes  Medications obtained and verified? Yes  Other? No  Any new allergies since your discharge? No  Dietary orders reviewed? Yes Do you have support at home? Yes   Follow up appointments reviewed:  PCP Hospital f/u appt confirmed? Yes  Scheduled to see Marthenia Rolling. Mikey Bussing, DO on 08/05/2023 @ 8:45 am, [facility]. Specialist Hospital f/u appt confirmed? No  Scheduled to see  on  @ . Are transportation arrangements needed? No  If their condition worsens, is the pt aware to call PCP or go to the Emergency Dept.? Yes Was the patient provided with contact information for the PCP's office or ED? Yes Was to pt encouraged to call back with questions or concerns? Yes  Cam Harnden [facility]  [facility], [facility] Guide Direct Dial: [phone]  Website: [email or web address]

## 2023-06-29 NOTE — Patient Instructions (Addendum)
You are doing really well from hiv standpoint  There are some issues which you might want to discuss with your regular pcp and rheumatology 1) low platelet count 2) probably needs annual visit with rheumatology even if not on medication due to potential systemic complication of Rheumatoid arthritis including low platelet count 3) dm2 could be a little better control -- please also discuss with pcp   Continue biktarvy   I'll see you in 9 months

## 2023-07-07 ENCOUNTER — Other Ambulatory Visit: Payer: Self-pay | Admitting: Internal Medicine

## 2023-07-07 DIAGNOSIS — B20 Human immunodeficiency virus [HIV] disease: Secondary | ICD-10-CM

## 2023-07-13 ENCOUNTER — Other Ambulatory Visit: Payer: Self-pay

## 2023-07-13 DIAGNOSIS — K219 Gastro-esophageal reflux disease without esophagitis: Secondary | ICD-10-CM

## 2023-07-14 MED ORDER — OMEPRAZOLE 20 MG PO CPDR
DELAYED_RELEASE_CAPSULE | ORAL | 0 refills | Status: DC
Start: 2023-07-14 — End: 2023-08-05

## 2023-07-21 DIAGNOSIS — L603 Nail dystrophy: Secondary | ICD-10-CM | POA: Diagnosis not present

## 2023-07-21 DIAGNOSIS — L309 Dermatitis, unspecified: Secondary | ICD-10-CM | POA: Diagnosis not present

## 2023-07-21 DIAGNOSIS — M205X2 Other deformities of toe(s) (acquired), left foot: Secondary | ICD-10-CM | POA: Diagnosis not present

## 2023-07-21 DIAGNOSIS — L989 Disorder of the skin and subcutaneous tissue, unspecified: Secondary | ICD-10-CM | POA: Diagnosis not present

## 2023-07-21 DIAGNOSIS — I739 Peripheral vascular disease, unspecified: Secondary | ICD-10-CM | POA: Diagnosis not present

## 2023-07-21 DIAGNOSIS — M205X1 Other deformities of toe(s) (acquired), right foot: Secondary | ICD-10-CM | POA: Diagnosis not present

## 2023-07-21 DIAGNOSIS — B351 Tinea unguium: Secondary | ICD-10-CM | POA: Diagnosis not present

## 2023-07-21 DIAGNOSIS — M792 Neuralgia and neuritis, unspecified: Secondary | ICD-10-CM | POA: Diagnosis not present

## 2023-07-21 DIAGNOSIS — E1142 Type 2 diabetes mellitus with diabetic polyneuropathy: Secondary | ICD-10-CM | POA: Diagnosis not present

## 2023-08-01 ENCOUNTER — Other Ambulatory Visit: Payer: Self-pay | Admitting: Internal Medicine

## 2023-08-01 DIAGNOSIS — Z79891 Long term (current) use of opiate analgesic: Secondary | ICD-10-CM

## 2023-08-02 ENCOUNTER — Telehealth: Payer: Self-pay

## 2023-08-02 MED ORDER — HYDROCODONE-ACETAMINOPHEN 7.5-325 MG PO TABS
1.0000 | ORAL_TABLET | Freq: Two times a day (BID) | ORAL | 0 refills | Status: DC | PRN
Start: 2023-08-02 — End: 2023-08-05

## 2023-08-02 NOTE — Telephone Encounter (Signed)
Called pt regarding his narcotic refill request. Pt is unsure why this was routed to [place], as he has another provider who prescribes this. No questions/concerns at this time.

## 2023-08-05 ENCOUNTER — Ambulatory Visit: Payer: Medicare HMO | Admitting: Internal Medicine

## 2023-08-05 ENCOUNTER — Encounter: Payer: Self-pay | Admitting: Internal Medicine

## 2023-08-05 VITALS — BP 145/85 | HR 64 | Temp 98.2°F | Ht 70.0 in | Wt 203.2 lb

## 2023-08-05 DIAGNOSIS — E11618 Type 2 diabetes mellitus with other diabetic arthropathy: Secondary | ICD-10-CM

## 2023-08-05 DIAGNOSIS — K219 Gastro-esophageal reflux disease without esophagitis: Secondary | ICD-10-CM | POA: Diagnosis not present

## 2023-08-05 DIAGNOSIS — I10 Essential (primary) hypertension: Secondary | ICD-10-CM

## 2023-08-05 DIAGNOSIS — Z79891 Long term (current) use of opiate analgesic: Secondary | ICD-10-CM | POA: Diagnosis not present

## 2023-08-05 DIAGNOSIS — Z87891 Personal history of nicotine dependence: Secondary | ICD-10-CM

## 2023-08-05 DIAGNOSIS — Z7984 Long term (current) use of oral hypoglycemic drugs: Secondary | ICD-10-CM

## 2023-08-05 DIAGNOSIS — Z794 Long term (current) use of insulin: Secondary | ICD-10-CM

## 2023-08-05 DIAGNOSIS — E1142 Type 2 diabetes mellitus with diabetic polyneuropathy: Secondary | ICD-10-CM

## 2023-08-05 LAB — POCT GLYCOSYLATED HEMOGLOBIN (HGB A1C): Hemoglobin A1C: 9.5 % — AB (ref 4.0–5.6)

## 2023-08-05 LAB — GLUCOSE, CAPILLARY: Glucose-Capillary: 157 mg/dL — ABNORMAL HIGH (ref 70–99)

## 2023-08-05 MED ORDER — OZEMPIC (2 MG/DOSE) 8 MG/3ML ~~LOC~~ SOPN: 2.0000 mg | PEN_INJECTOR | SUBCUTANEOUS | 3 refills | Status: DC

## 2023-08-05 MED ORDER — HYDROCODONE-ACETAMINOPHEN 7.5-325 MG PO TABS: 1.0000 | ORAL_TABLET | Freq: Two times a day (BID) | ORAL | 0 refills | Status: DC | PRN

## 2023-08-05 MED ORDER — HYDROCODONE-ACETAMINOPHEN 7.5-325 MG PO TABS
1.0000 | ORAL_TABLET | Freq: Two times a day (BID) | ORAL | 0 refills | Status: DC | PRN
Start: 2023-10-31 — End: 2023-10-06

## 2023-08-05 MED ORDER — LANTUS SOLOSTAR 100 UNIT/ML ~~LOC~~ SOPN: 55.0000 [IU] | PEN_INJECTOR | Freq: Every day | SUBCUTANEOUS | 3 refills | Status: DC

## 2023-08-05 MED ORDER — LOSARTAN POTASSIUM-HCTZ 100-25 MG PO TABS: 1.0000 | ORAL_TABLET | Freq: Every day | ORAL | 3 refills | Status: DC

## 2023-08-05 MED ORDER — OMEPRAZOLE 20 MG PO CPDR: DELAYED_RELEASE_CAPSULE | ORAL | 3 refills | Status: DC

## 2023-08-05 MED ORDER — HYDROCODONE-ACETAMINOPHEN 7.5-325 MG PO TABS
1.0000 | ORAL_TABLET | Freq: Two times a day (BID) | ORAL | 0 refills | Status: DC | PRN
Start: 2023-09-01 — End: 2023-09-01

## 2023-08-05 MED ORDER — CARVEDILOL 3.125 MG PO TABS: 3.1250 mg | ORAL_TABLET | Freq: Two times a day (BID) | ORAL | 3 refills | Status: DC

## 2023-08-05 NOTE — Assessment & Plan Note (Signed)
Patient noted to have chronic pain due to RA has been on a stable dose of hydrocodone.  Reviewed PDMP and appropriate.  Has had no red flags.  Will go ahead and refill medication.

## 2023-08-05 NOTE — Progress Notes (Signed)
Established Patient Office Visit  Subjective   Patient ID: Riley Jones, male    DOB: 04/28/1956  Age: 67 y.o. MRN: [id number]  Chief Complaint  Patient presents with   Medical Management of Chronic Issues   Riley Jones is here for follow-up of multiple chronic medical additions including hypertension diabetes and chronic pain.  He follows with our [facility] for his HIV which is well-controlled.  Please see assessment and plan for HPI details of chronic medical conditions.    Objective:     BP (!) 145/85 (BP Location: Right Arm, Patient Position: Sitting, Cuff Size: Normal)   Pulse 64   Temp 98.2 F (36.8 C) (Oral)   Ht 5\' 10"  (1.778 m)   Wt 203 lb 3.2 oz (92.2 kg)   SpO2 97% Comment: RA  BMI 29.16 kg/m  BP Readings from Last 3 Encounters:  08/05/23 (!) 145/85  06/29/23 (!) 149/91  06/04/23 128/73   Wt Readings from Last 3 Encounters:  08/05/23 203 lb 3.2 oz (92.2 kg)  06/29/23 202 lb (91.6 kg)  03/24/23 204 lb (92.5 kg)      Physical Exam Vitals and nursing note reviewed.  Constitutional:      General: He is not in acute distress. Pulmonary:     Effort: Pulmonary effort is normal.  Musculoskeletal:     Right lower leg: No edema.     Left lower leg: No edema.  Psychiatric:        Mood and Affect: Mood normal.      Results for orders placed or performed in visit on 08/05/23  Glucose, capillary  Result Value Ref Range   Glucose-Capillary 157 (H) 70 - 99 mg/dL  POC Hbg I6N  Result Value Ref Range   Hemoglobin A1C 9.5 (A) 4.0 - 5.6 %   HbA1c POC (<> result, manual entry)     HbA1c, POC (prediabetic range)     HbA1c, POC (controlled diabetic range)      Last CBC Lab Results  Component Value Date   WBC 6.2 06/15/2023   HGB 15.3 06/15/2023   HCT 47.0 06/15/2023   MCV 91.3 06/15/2023   MCH 29.7 06/15/2023   RDW 13.5 06/15/2023   PLT 46 (L) 06/15/2023   Last hemoglobin A1c Lab Results  Component Value Date   HGBA1C 9.5 (A) 08/05/2023      The 10-year  ASCVD risk score (Arnett DK, et al., 2019) is: 36.1%    Assessment & Plan:   Problem List Items Addressed This Visit       Cardiovascular and Mediastinum   Essential hypertension (Chronic)    Taking carvedilol and losartan hydrochlorothiazide.  Asymptomatic blood pressure above goal and still elevated especially diastolic on recheck.  I will have him increase losartan-HCTZ from 50-12.5 to 100 mg-25.      Relevant Medications   carvedilol (COREG) 3.125 MG tablet   losartan-hydrochlorothiazide (HYZAAR) 100-25 MG tablet     Endocrine   Type 2 diabetes mellitus with peripheral neuropathy (HCC) - Primary (Chronic)    He has been taking his medications as prescribed he can remember all of his medications and dosages.  He is on Synjardy, Ozempic, Lantus 50 units but A1c is still uncontrolled.  Additionally downloaded Shary Key Zachery wore the CGM for 14 days. The average reading was 224, % time in target was 20, % time below target was 0, and % time above target was. 80%. Intervention will be to increase basal.   As noted from his  CGM data he is consistently above target.  I will have him go up to 55 units a 10% increase I have instructed him to further increase up to max of 65 units daily as he needs to for glycemic control.  He did mention some minor diarrhea he thought this may be due to the Ozempic currently it is not bothering him I discussed with him this also could be due to the metformin component but we can certainly follow for now.       Relevant Medications   insulin glargine (LANTUS SOLOSTAR) 100 UNIT/ML Solostar Pen   Semaglutide, 2 MG/DOSE, (OZEMPIC, 2 MG/DOSE,) 8 MG/3ML SOPN   losartan-hydrochlorothiazide (HYZAAR) 100-25 MG tablet   Other Relevant Orders   POC Hbg A1C (Completed)     Other   Chronic prescription opiate use    Patient noted to have chronic pain due to RA has been on a stable dose of hydrocodone.  Reviewed PDMP and appropriate.  Has had no red flags.  Will go  ahead and refill medication.      Relevant Medications   HYDROcodone-acetaminophen (NORCO) 7.5-325 MG tablet (Start on 09/01/2023)   HYDROcodone-acetaminophen (NORCO) 7.5-325 MG tablet (Start on 10/01/2023)   HYDROcodone-acetaminophen (NORCO) 7.5-325 MG tablet (Start on 10/31/2023)   Other Visit Diagnoses     History of tobacco use       Relevant Orders   CT CHEST LUNG CA SCREEN LOW DOSE W/O CM   Type 2 diabetes mellitus with other diabetic arthropathy, with long-term current use of insulin (HCC)       Relevant Medications   insulin glargine (LANTUS SOLOSTAR) 100 UNIT/ML Solostar Pen   Semaglutide, 2 MG/DOSE, (OZEMPIC, 2 MG/DOSE,) 8 MG/3ML SOPN   losartan-hydrochlorothiazide (HYZAAR) 100-25 MG tablet   Gastroesophageal reflux disease       Relevant Medications   omeprazole (PRILOSEC) 20 MG capsule       Return in about 3 months (around 11/05/2023).    Gust Rung, DO

## 2023-08-05 NOTE — Assessment & Plan Note (Signed)
He has been taking his medications as prescribed he can remember all of his medications and dosages.  He is on Synjardy, Ozempic, Lantus 50 units but A1c is still uncontrolled.  Additionally downloaded Shary Key Vantil wore the CGM for 14 days. The average reading was 224, % time in target was 20, % time below target was 0, and % time above target was. 80%. Intervention will be to increase basal.   As noted from his CGM data he is consistently above target.  I will have him go up to 55 units a 10% increase I have instructed him to further increase up to max of 65 units daily as he needs to for glycemic control.  He did mention some minor diarrhea he thought this may be due to the Ozempic currently it is not bothering him I discussed with him this also could be due to the metformin component but we can certainly follow for now.

## 2023-08-05 NOTE — Assessment & Plan Note (Signed)
Taking carvedilol and losartan hydrochlorothiazide.  Asymptomatic blood pressure above goal and still elevated especially diastolic on recheck.  I will have him increase losartan-HCTZ from 50-12.5 to 100 mg-25.

## 2023-08-10 ENCOUNTER — Ambulatory Visit
Admission: RE | Admit: 2023-08-10 | Discharge: 2023-08-10 | Disposition: A | Discharge status: Home / Self Care | Payer: Medicare HMO | Source: Ambulatory Visit | Attending: Internal Medicine | Admitting: Internal Medicine

## 2023-08-10 DIAGNOSIS — J439 Emphysema, unspecified: Secondary | ICD-10-CM | POA: Diagnosis not present

## 2023-08-10 DIAGNOSIS — I700 Atherosclerosis of aorta: Secondary | ICD-10-CM | POA: Diagnosis not present

## 2023-08-10 DIAGNOSIS — Z87891 Personal history of nicotine dependence: Secondary | ICD-10-CM | POA: Diagnosis not present

## 2023-09-01 ENCOUNTER — Other Ambulatory Visit: Payer: Self-pay | Admitting: Internal Medicine

## 2023-09-01 DIAGNOSIS — Z79891 Long term (current) use of opiate analgesic: Secondary | ICD-10-CM

## 2023-09-01 DIAGNOSIS — N401 Enlarged prostate with lower urinary tract symptoms: Secondary | ICD-10-CM

## 2023-09-02 MED ORDER — HYDROCODONE-ACETAMINOPHEN 7.5-325 MG PO TABS: 1.0000 | ORAL_TABLET | Freq: Two times a day (BID) | ORAL | 0 refills | Status: DC | PRN

## 2023-09-02 MED ORDER — TADALAFIL 5 MG PO TABS: 5.0000 mg | ORAL_TABLET | Freq: Every day | ORAL | 1 refills | Status: DC

## 2023-09-02 MED ORDER — TAMSULOSIN HCL 0.4 MG PO CAPS: 0.4000 mg | ORAL_CAPSULE | Freq: Every day | ORAL | 1 refills | Status: DC

## 2023-09-02 NOTE — Telephone Encounter (Signed)
Hydrocodone was refilled 09/01/23.

## 2023-09-03 NOTE — Progress Notes (Unsigned)
CARDIOLOGY OFFICE NOTE  Date:  09/03/2023    Riley Jones Date of Birth: 1956/02/03 Medical Record #[id number]  PCP:  Gust Rung, DO  Cardiologist:  Townsend Roger    No chief complaint on file.    History of Present Illness: Riley Jones is a 67 y.o. male has a history of known CAD with LAD stent in 2015, ongoing tobacco use, DM-2, HIV, RA, HLD, HTN and chronic pain syndrome. Last ischemic evaluation 02/28/17 normal myovue no ischemia EF 53% He continues to smoke had rash with nicotine patch and Chantix not helpful. Lung cancer CT 04/17/22  benign just emphysema given 44 pack year smoking history DM poorly controlled with A1c over 8 Activity limited by RA with hand, back, hip pain on Norco    Father died in [place] and his land is very valuable Patient and his sister could inherit millions  Myovue 04/23/21 normal no ischemia EF 53% TTE EF 65-70% mild LVH , AV sclerosis Ao 4.1 cm  Still at [facility] lives in [place] Quit smoking  April 2023  ***    Past Medical History:  Diagnosis Date   Anxiety    Arthritis    "pretty much all over"   Asthma    "mild" (07/17/2014)   Atypical angina (HCC)    Myoview 2003, Normal EF 64%   CAD (coronary artery disease)    a. 10/20 DES to 80% mid LAD   Candida rash of groin 11/03/2019   COPD (chronic obstructive pulmonary disease) (HCC)    "mild" (07/17/2014)   Depression    Fatigue 06/22/2019   Foot pain 03/30/2011   GERD (gastroesophageal reflux disease)    H/O hiatal hernia    Headache    "weekly" (06/2014)   History of gout    HIV (human immunodeficiency virus infection) (HCC) 1995   DX after shingles followed by Dr. Sampson Goon (ID)   Hx of cardiovascular stress test    ETT-Myoview (3/16):  Ex 10:15, No Ischemia, EF 57%; Normal Study   Hyperlipidemia    Hypertension    Well Controlled off meds   Increased anion gap metabolic acidosis 01/22/2021   Kidney stones    "passed them all"   Left lower quadrant abdominal  pain 11/02/2019   Other and unspecified angina pectoris 12/18/2008   Overview:  Overview:  Greatly appreciate PCP f/u.   Last Assessment & Plan:  - Patient with episode of CP - 4 days, constant, substernal, not immediately resolved with NTG, felt better after burping and passing gas - Repeat EKG done here with no acute ST/T wave changes - CP now completely resolved. Encouraged patient to call 911 if recurs - To f/u with cardio in the next 2 weeks for further assess   Pneumonia 4-5 times   Rheumatoid arthritis(714.0)    Followed by Dr. 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 DES to 80% mid LAD   HERNIA REPAIR     LAPAROSCOPIC INCISIONAL / UMBILICAL / VENTRAL HERNIA REPAIR  1990's   "it was a  double; not inguinal"   LEFT HEART CATHETERIZATION WITH CORONARY ANGIOGRAM N/A 07/17/2014   Procedure: LEFT HEART CATHETERIZATION WITH CORONARY ANGIOGRAM;  Surgeon: Wendall Stade, MD;  Location[facility] C[facility] LAB;  Service: Cardiovascular;  Laterality: N/A;   PERCUTANEOUS STENT INTERVENTION  07/17/2014   Procedure: PERCUTANEOUS STENT INTERVENTION;  Surgeon: Wendall Stade, MD;  Location: [facility] CATH LAB;  Service: Cardiovascular;;   TONSILLECTOMY AND ADENOIDECTOMY  1990's   TRIGGER FINGER RELEASE Left 1990's   2nd & 5th digits   UMBILICAL HERNIA REPAIR  1980's   w/mesh   UVULOPALATOPHARYNGOPLASTY (UPPP)/TONSILLECTOMY/SEPTOPLASTY  1990's     Medications: No outpatient medications have been marked as taking for the 09/09/23 encounter (Appointment) with Wendall Stade, MD.     Allergies: Allergies  Allergen Reactions   Penicillins Hives,  Nausea Only and Rash    Has patient had a PCN reaction causing immediate rash, facial/tongue/throat swelling, SOB or lightheadedness with hypotension: Yes Has patient had a PCN reaction causing severe rash involving mucus membranes or skin necrosis: No Has patient had a PCN reaction that required hospitalization: No Has patient had a PCN reaction occurring within the last 10 years: No If all of the above answers are "NO", then may proceed with Cephalosporin use.    Zoloft [Sertraline Hcl] Other (See Comments)    Patient reported Psychomotor slowing / worsened depression   Victoza [Liraglutide]     Did not tolerate this medication.  Complained of dizziness even at the lowest dose   Chantix [Varenicline] Hives   Gabapentin Itching   Lisinopril Cough   Ozempic (0.25 Or 0.5 Mg-Dose) [Semaglutide(0.25 Or 0.5mg -Dos)] Itching and Rash    Rash to chest wall.   Evaristo Bury [Insulin Degludec] Rash    Social History: The patient  reports that he quit smoking about 20 months ago. His smoking use included cigarettes. He started smoking about 47 years ago. He has a 46 pack-year smoking history. He has never used smokeless tobacco. He reports that he does not currently use alcohol after a past usage of about 1.0 standard drink of alcohol per week. He reports that he does not use drugs.   Family History: The patient's family history includes COPD in his father; Diabetes in his father and mother; Heart attack in his brother and father; Heart disease in his father; Osteoporosis in his mother; Stroke in his maternal grandfather, maternal grandmother, paternal grandfather, and paternal grandmother.   Review of Systems: Please see the history of present illness.   All other systems are reviewed and negative.   Physical Exam: VS:  There were no vitals taken for this visit. Marland Kitchen  BMI There is no height or weight on file to calculate BMI.  Wt Readings from Last 3 Encounters:  08/05/23 203 lb 3.2 oz (92.2 kg)   06/29/23 202 lb (91.6 kg)  03/24/23 204 lb (92.5 kg)    General: Alert and in no acute distress.   Cardiac: Regular rate and rhythm. Heart tones are distant. No edema.  Respiratory:  Lungs are coarse but with normal work of breathing.  GI: Soft and nontender.  MS: No deformity or atrophy. Gait and ROM intact.  Skin: Warm and dry. Color is normal.  Neuro:  Strength and sensation are intact and no gross focal deficits noted.  Psych: Alert, appropriate and with normal affect.   LABORATORY DATA:  EKG:  EKG is ordered today.  Personally reviewed by me. This demonstrates NSR with PAC noted. HR is 72 today.  Lab Results  Component Value Date   WBC 6.2 06/15/2023   HGB 15.3 06/15/2023   HCT 47.0 06/15/2023   PLT 46 (L) 06/15/2023   GLUCOSE 165 (H) 06/15/2023   CHOL 154 06/15/2023   TRIG 150 (H) 06/15/2023   HDL 51 06/15/2023   LDLCALC 78 06/15/2023   ALT 22 06/15/2023   AST 21 06/15/2023   NA 138 06/15/2023   K 4.2 06/15/2023   CL 99 06/15/2023   CREATININE 0.89 06/15/2023   BUN 15 06/15/2023   CO2 27 06/15/2023   TSH 1.590 06/22/2019   PSA 1.09 05/07/2009   INR 0.9 07/10/2014   HGBA1C 9.5 (A) 08/05/2023   MICROALBUR 1.9 11/22/2014     BNP (last 3 results) No results for input(s): "BNP" in the last 8760 hours.  ProBNP (last 3 results) No results for input(s): "PROBNP" in the last 8760 hours.   Other Studies Reviewed Today:  Echo Study Conclusions 02/2017   - Left ventricle: The cavity size was normal. Wall thickness was   increased in a pattern of mild LVH. Systolic function was normal.   The estimated ejection fraction was in the range of 60% to 65%.   Wall motion was normal; there were no regional wall motion   abnormalities. - Aortic valve: Trileaflet; mildly calcified leaflets. - Mitral valve: Calcified annulus. - Right ventricle: The cavity size was mildly dilated. Wall   thickness was normal. Systolic function was normal.   Myoview Study Result  02/2017     There was no ST segment deviation noted during stress. The study is normal. This is a low risk study. Nuclear stress EF: 53%.   Low risk stress nuclear study with normal perfusion and low normal left ventricular global systolic function.        ASSESSMENT & PLAN:     1. CAD - prior LAD PCI from 2015 - known residual disease - needs aggressive CV risk factor modification. He has no worrisome symptoms  He has been maintained on chronic DAPT, statin and beta blocker. Non ischemic myovue 04/18/22   2. Tobacco abuse - Quit date April  2023 congratulated him on this lung cancer screening CT negative 04/18/22    3. DM - uncontrolled. A1c 8.7  f/u primary Dr Heide Spark   4. HLD - on statin - labs LDL 78 06/15/23    5. HTN - BP looks good on his current regimen - no changes made today.   6. HIV - followed by Dr. Ninetta Lights  HIV RNA quant up 45 DC4 T cell count 583   7. Decreased platelet count - 46 06/15/23 no bleeding events  ? Autoimmune related to RA vs HIV No splenomegaly on CT   8. Chronic Pain:  from RA on hydrocodone no red flags f/u [facility]    Disposition:   FU in a year    Patient is agreeable to this plan and will call if any problems develop in the interim.   Signed: Charlton Haws, MD  09/03/2023 12:59 PM  [facility] [address] [place], [place]  [address] Phone: [phone] Fax: [phone]

## 2023-09-04 DIAGNOSIS — E1165 Type 2 diabetes mellitus with hyperglycemia: Secondary | ICD-10-CM | POA: Diagnosis not present

## 2023-09-09 ENCOUNTER — Ambulatory Visit: Payer: Medicare HMO | Attending: Cardiovascular Disease | Admitting: Cardiovascular Disease

## 2023-09-09 VITALS — BP 124/62 | HR 68 | Ht 70.0 in | Wt 203.2 lb

## 2023-09-09 DIAGNOSIS — I2510 Atherosclerotic heart disease of native coronary artery without angina pectoris: Secondary | ICD-10-CM

## 2023-09-09 DIAGNOSIS — E782 Mixed hyperlipidemia: Secondary | ICD-10-CM | POA: Diagnosis not present

## 2023-09-09 DIAGNOSIS — I10 Essential (primary) hypertension: Secondary | ICD-10-CM | POA: Diagnosis not present

## 2023-09-09 DIAGNOSIS — Z09 Encounter for follow-up examination after completed treatment for conditions other than malignant neoplasm: Secondary | ICD-10-CM

## 2023-09-09 NOTE — Patient Instructions (Signed)
Medication Instructions:  Your physician recommends that you continue on your current medications as directed. Please refer to the Current Medication list given to you today.  *If you need a refill on your cardiac medications before your next appointment, please call your pharmacy*  Lab Work: If you have labs (blood work) drawn today and your tests are completely normal, you will receive your results only by: MyChart Message (if you have MyChart) OR A paper copy in the mail If you have any lab test that is abnormal or we need to change your treatment, we will call you to review the results.  Testing/Procedures: None ordered today.  Follow-Up: At [facility], you and your health needs are our priority.  As part of our continuing mission to provide you with exceptional heart care, we have created designated Provider Care Teams.  These Care Teams include your primary Cardiologist (physician) and Advanced Practice Providers (APPs -  Physician Assistants and Nurse Practitioners) who all work together to provide you with the care you need, when you need it.  We recommend signing up for the patient portal called "MyChart".  Sign up information is provided on this After Visit Summary.  MyChart is used to connect with patients for Virtual Visits (Telemedicine).  Patients are able to view lab/test results, encounter notes, upcoming appointments, etc.  Non-urgent messages can be sent to your provider as well.   To learn more about what you can do with MyChart, go to [email or web address].    Your next appointment:   1 year(s)  Provider:   Peter Nishan, MD      

## 2023-09-27 DIAGNOSIS — M205X1 Other deformities of toe(s) (acquired), right foot: Secondary | ICD-10-CM | POA: Diagnosis not present

## 2023-09-27 DIAGNOSIS — M792 Neuralgia and neuritis, unspecified: Secondary | ICD-10-CM | POA: Diagnosis not present

## 2023-09-27 DIAGNOSIS — E1142 Type 2 diabetes mellitus with diabetic polyneuropathy: Secondary | ICD-10-CM | POA: Diagnosis not present

## 2023-09-27 DIAGNOSIS — L603 Nail dystrophy: Secondary | ICD-10-CM | POA: Diagnosis not present

## 2023-09-27 DIAGNOSIS — I739 Peripheral vascular disease, unspecified: Secondary | ICD-10-CM | POA: Diagnosis not present

## 2023-09-27 DIAGNOSIS — M205X2 Other deformities of toe(s) (acquired), left foot: Secondary | ICD-10-CM | POA: Diagnosis not present

## 2023-09-27 DIAGNOSIS — L989 Disorder of the skin and subcutaneous tissue, unspecified: Secondary | ICD-10-CM | POA: Diagnosis not present

## 2023-09-27 DIAGNOSIS — B351 Tinea unguium: Secondary | ICD-10-CM | POA: Diagnosis not present

## 2023-09-27 DIAGNOSIS — L309 Dermatitis, unspecified: Secondary | ICD-10-CM | POA: Diagnosis not present

## 2023-10-06 ENCOUNTER — Other Ambulatory Visit: Payer: Self-pay | Admitting: Internal Medicine

## 2023-10-06 DIAGNOSIS — Z79891 Long term (current) use of opiate analgesic: Secondary | ICD-10-CM

## 2023-10-06 MED ORDER — HYDROCODONE-ACETAMINOPHEN 7.5-325 MG PO TABS
1.0000 | ORAL_TABLET | Freq: Two times a day (BID) | ORAL | 0 refills | Status: DC | PRN
Start: 2023-10-06 — End: 2023-11-01

## 2023-10-09 ENCOUNTER — Other Ambulatory Visit: Payer: Self-pay | Admitting: Internal Medicine

## 2023-10-09 DIAGNOSIS — K219 Gastro-esophageal reflux disease without esophagitis: Secondary | ICD-10-CM

## 2023-10-19 DIAGNOSIS — H2513 Age-related nuclear cataract, bilateral: Secondary | ICD-10-CM | POA: Diagnosis not present

## 2023-10-19 DIAGNOSIS — E113311 Type 2 diabetes mellitus with moderate nonproliferative diabetic retinopathy with macular edema, right eye: Secondary | ICD-10-CM | POA: Diagnosis not present

## 2023-10-19 DIAGNOSIS — H35363 Drusen (degenerative) of macula, bilateral: Secondary | ICD-10-CM | POA: Diagnosis not present

## 2023-10-19 DIAGNOSIS — E113292 Type 2 diabetes mellitus with mild nonproliferative diabetic retinopathy without macular edema, left eye: Secondary | ICD-10-CM | POA: Diagnosis not present

## 2023-11-01 ENCOUNTER — Other Ambulatory Visit: Payer: Self-pay | Admitting: Internal Medicine

## 2023-11-01 ENCOUNTER — Other Ambulatory Visit: Payer: Self-pay | Admitting: Physician Assistant

## 2023-11-01 DIAGNOSIS — Z79891 Long term (current) use of opiate analgesic: Secondary | ICD-10-CM

## 2023-11-03 ENCOUNTER — Other Ambulatory Visit: Payer: Self-pay

## 2023-11-03 MED ORDER — HYDROCODONE-ACETAMINOPHEN 7.5-325 MG PO TABS: 1.0000 | ORAL_TABLET | Freq: Two times a day (BID) | ORAL | 0 refills | Status: DC | PRN

## 2023-11-03 MED ORDER — LOSARTAN POTASSIUM-HCTZ 100-25 MG PO TABS: 1.0000 | ORAL_TABLET | Freq: Every day | ORAL | 3 refills | Status: DC

## 2023-11-16 ENCOUNTER — Encounter: Payer: Self-pay | Admitting: Internal Medicine

## 2023-11-16 NOTE — Telephone Encounter (Signed)
I called pt - pt was informed Losartan-hydrochlorothiazide 100/25 was refilled on 11/03/23, sent to [facility] on [address]. Pt stated he forgot it was increased (OV 07/2023). He will call [facility].

## 2023-11-18 ENCOUNTER — Encounter: Payer: Medicare HMO | Admitting: Internal Medicine

## 2023-11-18 ENCOUNTER — Telehealth: Payer: Self-pay | Admitting: *Deleted

## 2023-11-18 NOTE — Telephone Encounter (Signed)
 Patient was identified as falling into the [place] Measure - Diabetes.   Patient was: Appointment scheduled with primary care provider in the next 30 days.

## 2023-11-22 ENCOUNTER — Ambulatory Visit (INDEPENDENT_AMBULATORY_CARE_PROVIDER_SITE_OTHER): Payer: Medicare HMO | Admitting: Internal Medicine

## 2023-11-22 ENCOUNTER — Encounter: Payer: Self-pay | Admitting: Internal Medicine

## 2023-11-22 VITALS — BP 135/80 | HR 72 | Temp 97.7°F | Ht 70.0 in

## 2023-11-22 DIAGNOSIS — I10 Essential (primary) hypertension: Secondary | ICD-10-CM | POA: Diagnosis not present

## 2023-11-22 DIAGNOSIS — M05741 Rheumatoid arthritis with rheumatoid factor of right hand without organ or systems involvement: Secondary | ICD-10-CM | POA: Diagnosis not present

## 2023-11-22 DIAGNOSIS — Z7984 Long term (current) use of oral hypoglycemic drugs: Secondary | ICD-10-CM

## 2023-11-22 DIAGNOSIS — Z794 Long term (current) use of insulin: Secondary | ICD-10-CM | POA: Diagnosis not present

## 2023-11-22 DIAGNOSIS — E1142 Type 2 diabetes mellitus with diabetic polyneuropathy: Secondary | ICD-10-CM

## 2023-11-22 DIAGNOSIS — M05742 Rheumatoid arthritis with rheumatoid factor of left hand without organ or systems involvement: Secondary | ICD-10-CM

## 2023-11-22 DIAGNOSIS — Z79891 Long term (current) use of opiate analgesic: Secondary | ICD-10-CM | POA: Diagnosis not present

## 2023-11-22 LAB — POCT GLYCOSYLATED HEMOGLOBIN (HGB A1C): Hemoglobin A1C: 10.1 % — AB (ref 4.0–5.6)

## 2023-11-22 LAB — GLUCOSE, CAPILLARY: Glucose-Capillary: 188 mg/dL — ABNORMAL HIGH (ref 70–99)

## 2023-11-22 MED ORDER — FIASP FLEXTOUCH 100 UNIT/ML ~~LOC~~ SOPN: 12.0000 [IU] | PEN_INJECTOR | Freq: Three times a day (TID) | SUBCUTANEOUS | 5 refills | Status: DC

## 2023-11-22 MED ORDER — HYDROCODONE-ACETAMINOPHEN 7.5-325 MG PO TABS: 1.0000 | ORAL_TABLET | Freq: Two times a day (BID) | ORAL | 0 refills | Status: DC | PRN

## 2023-11-22 MED ORDER — SYNJARDY XR 12.5-1000 MG PO TB24: 1.0000 | ORAL_TABLET | Freq: Two times a day (BID) | ORAL | 3 refills | Status: AC

## 2023-11-22 NOTE — Assessment & Plan Note (Signed)
 Reviewed PDMP.  Has had appropriate use of his hydrocodone/acetaminophen.  Will provide 3 additional prescription refills.

## 2023-11-22 NOTE — Assessment & Plan Note (Signed)
 Discontinue Ozempic and reconsider alternative GLP in the future but for now we will start short acting insulin by FIASP starting with 12 units with meals.  Continue between 55 and 65 units Lantus.  Briefly discussed correction factor about 20.  But I do not think he is fully ready for the correction factor so I discussed with him just starting the mealtime insulin we can adjust more next visit.

## 2023-11-22 NOTE — Progress Notes (Signed)
 Established Patient Office Visit  Subjective   Patient ID: Riley Jones, male    DOB: [birth year]  Age: 68 y.o. MRN: [id number]  Chief Complaint  Patient presents with   Follow-up    Discuss Ozempic - side effects. He has been taking increase dose of Hyzaar.    Here to follow-up for diabetes and hypertension.  He recently got his Hyzaar refill at the higher dose and started taking this blood pressure is well-controlled and denies side effects.  For his diabetes this is still uncontrolled.  He has been taking Ozempic has had some itching and rash of his lower abdomen thinks it may be related to the medicine.  Notes that it does help decrease his appetite but he has not lost weight.  Intermittently has diarrhea.  Wants to stop medication. Reports Lantus he has been taking between 55 and 60 units nightly.      Objective:     BP 135/80 (BP Location: Right Arm, Patient Position: Sitting, Cuff Size: Normal)   Pulse 72   Temp 97.7 F (36.5 C) (Oral)   Ht 5\' 10"  (1.778 m)   SpO2 100% Comment: RA  BMI 29.16 kg/m  BP Readings from Last 3 Encounters:  11/22/23 135/80  09/09/23 124/62  08/05/23 (!) 145/85   Wt Readings from Last 3 Encounters:  09/09/23 203 lb 3.7 oz (92.2 kg)  08/05/23 203 lb 3.2 oz (92.2 kg)  06/29/23 202 lb (91.6 kg)      Physical Exam Vitals and nursing note reviewed.  Pulmonary:     Effort: Pulmonary effort is normal.  Psychiatric:        Mood and Affect: Mood normal.        Behavior: Behavior normal.      Results for orders placed or performed in visit on 11/22/23  Glucose, capillary  Result Value Ref Range   Glucose-Capillary 188 (H) 70 - 99 mg/dL  POC Hbg K4M  Result Value Ref Range   Hemoglobin A1C 10.1 (A) 4.0 - 5.6 %   HbA1c POC (<> result, manual entry)     HbA1c, POC (prediabetic range)     HbA1c, POC (controlled diabetic range)      Last hemoglobin A1c Lab Results  Component Value Date   HGBA1C 10.1 (A) 11/22/2023      The  10-year ASCVD risk score (Arnett DK, et al., 2019) is: 32.5%    Assessment & Plan:   Problem List Items Addressed This Visit       Cardiovascular and Mediastinum   Essential hypertension (Chronic)   Well-controlled today continue carvedilol and Hyzaar 100-25 daily        Endocrine   Type 2 diabetes mellitus with peripheral neuropathy (HCC) - Primary (Chronic)   Discontinue Ozempic and reconsider alternative GLP in the future but for now we will start short acting insulin by FIASP starting with 12 units with meals.  Continue between 55 and 65 units Lantus.  Briefly discussed correction factor about 20.  But I do not think he is fully ready for the correction factor so I discussed with him just starting the mealtime insulin we can adjust more next visit.      Relevant Medications   Empagliflozin-metFORMIN HCl ER (SYNJARDY XR) 12.01-999 MG TB24   insulin aspart (FIASP FLEXTOUCH) 100 UNIT/ML FlexTouch Pen   Other Relevant Orders   POC Hbg A1C (Completed)     Musculoskeletal and Integument   Rheumatoid arthritis (HCC)   Reviewed PDMP.  Has  had appropriate use of his hydrocodone/acetaminophen.  Will provide 3 additional prescription refills.      Relevant Medications   HYDROcodone-acetaminophen (NORCO) 7.5-325 MG tablet (Start on 12/04/2023)   HYDROcodone-acetaminophen (NORCO) 7.5-325 MG tablet (Start on 01/03/2024)   HYDROcodone-acetaminophen (NORCO) 7.5-325 MG tablet (Start on 02/02/2024)     Other   Chronic prescription opiate use   Relevant Medications   HYDROcodone-acetaminophen (NORCO) 7.5-325 MG tablet (Start on 12/04/2023)   HYDROcodone-acetaminophen (NORCO) 7.5-325 MG tablet (Start on 01/03/2024)   HYDROcodone-acetaminophen (NORCO) 7.5-325 MG tablet (Start on 02/02/2024)    Return in about 3 months (around 02/19/2024) for DM.    Gust Rung, DO

## 2023-11-22 NOTE — Assessment & Plan Note (Signed)
 Well-controlled today continue carvedilol and Hyzaar 100-25 daily

## 2023-11-23 ENCOUNTER — Telehealth: Payer: Self-pay

## 2023-11-23 NOTE — Telephone Encounter (Signed)
 Per pharmacy they are needing a rx for fiasp pen needles

## 2023-11-24 MED ORDER — PEN NEEDLES 32G X 4 MM MISC: 1.0000 [IU] | Freq: Four times a day (QID) | 11 refills | Status: DC

## 2023-11-24 NOTE — Telephone Encounter (Signed)
 Pen needles sent

## 2023-11-29 ENCOUNTER — Telehealth: Payer: Self-pay

## 2023-11-29 NOTE — Telephone Encounter (Signed)
 Pharmacy requesting alternative rx for insulin glargine solostar, per pharmacy the prescribed medication is not covered by insurance. No alternatives listed.

## 2023-12-03 DIAGNOSIS — E1165 Type 2 diabetes mellitus with hyperglycemia: Secondary | ICD-10-CM | POA: Diagnosis not present

## 2023-12-15 ENCOUNTER — Encounter (INDEPENDENT_AMBULATORY_CARE_PROVIDER_SITE_OTHER): Payer: Self-pay

## 2023-12-25 ENCOUNTER — Other Ambulatory Visit: Payer: Self-pay | Admitting: Internal Medicine

## 2023-12-25 DIAGNOSIS — Z79891 Long term (current) use of opiate analgesic: Secondary | ICD-10-CM

## 2024-01-04 ENCOUNTER — Encounter: Payer: Self-pay | Admitting: Internal Medicine

## 2024-01-10 DIAGNOSIS — E291 Testicular hypofunction: Secondary | ICD-10-CM | POA: Diagnosis not present

## 2024-01-17 DIAGNOSIS — R3912 Poor urinary stream: Secondary | ICD-10-CM | POA: Diagnosis not present

## 2024-01-17 DIAGNOSIS — N5201 Erectile dysfunction due to arterial insufficiency: Secondary | ICD-10-CM | POA: Diagnosis not present

## 2024-01-17 DIAGNOSIS — E291 Testicular hypofunction: Secondary | ICD-10-CM | POA: Diagnosis not present

## 2024-01-17 DIAGNOSIS — N401 Enlarged prostate with lower urinary tract symptoms: Secondary | ICD-10-CM | POA: Diagnosis not present

## 2024-01-19 ENCOUNTER — Other Ambulatory Visit: Payer: Self-pay

## 2024-01-19 MED ORDER — PEN NEEDLES 32G X 4 MM MISC: 1.0000 [IU] | Freq: Four times a day (QID) | 11 refills | Status: DC

## 2024-01-19 NOTE — Telephone Encounter (Signed)
 [facility] pharmacy is requesting a refill for pen needles, last rx was sent 11/24/23 with 11 refills to [facility] pharmacy on [address]. Per the patient the pen needles was supposed to be sent to [facility] pharmacy. I have sent the rx to [facility]aram[facility] and I have called [facility]  pharmacy and cancelled the rx that was sent there.

## 2024-01-24 ENCOUNTER — Telehealth: Payer: Self-pay

## 2024-01-24 NOTE — Telephone Encounter (Signed)
 [facility] pharmacy is requesting a rx refill for BD micro pen needle [id number]

## 2024-01-25 MED ORDER — BD PEN NEEDLE MICRO U/F 32G X 6 MM MISC: 1.0000 [IU] | Freq: Four times a day (QID) | 11 refills | Status: AC

## 2024-02-10 ENCOUNTER — Encounter: Admitting: Internal Medicine

## 2024-02-15 NOTE — Progress Notes (Signed)
 The 10-year ASCVD risk score (Arnett DK, et al., 2019) is: 32.5%   Values used to calculate the score:     Age: 68 years     Sex: Male     Is Non-Hispanic African American: No     Diabetic: Yes     Tobacco smoker: No     Systolic Blood Pressure: 135 mmHg     Is BP treated: Yes     HDL Cholesterol: 51 mg/dL     Total Cholesterol: 154 mg/dL  Currently prescribed atorvastatin  40 mg.   Meryem Haertel, BSN, RN

## 2024-03-02 ENCOUNTER — Encounter: Admitting: Internal Medicine

## 2024-03-02 DIAGNOSIS — E1165 Type 2 diabetes mellitus with hyperglycemia: Secondary | ICD-10-CM | POA: Diagnosis not present

## 2024-03-07 ENCOUNTER — Encounter: Payer: Self-pay | Admitting: Internal Medicine

## 2024-03-07 ENCOUNTER — Ambulatory Visit (INDEPENDENT_AMBULATORY_CARE_PROVIDER_SITE_OTHER): Admitting: Internal Medicine

## 2024-03-07 VITALS — BP 144/67 | HR 67 | Temp 98.4°F | Ht 70.0 in | Wt 216.0 lb

## 2024-03-07 DIAGNOSIS — L409 Psoriasis, unspecified: Secondary | ICD-10-CM | POA: Diagnosis not present

## 2024-03-07 DIAGNOSIS — J449 Chronic obstructive pulmonary disease, unspecified: Secondary | ICD-10-CM

## 2024-03-07 DIAGNOSIS — E1142 Type 2 diabetes mellitus with diabetic polyneuropathy: Secondary | ICD-10-CM

## 2024-03-07 DIAGNOSIS — J42 Unspecified chronic bronchitis: Secondary | ICD-10-CM

## 2024-03-07 DIAGNOSIS — Z7985 Long-term (current) use of injectable non-insulin antidiabetic drugs: Secondary | ICD-10-CM | POA: Diagnosis not present

## 2024-03-07 DIAGNOSIS — I10 Essential (primary) hypertension: Secondary | ICD-10-CM | POA: Diagnosis not present

## 2024-03-07 DIAGNOSIS — Z79891 Long term (current) use of opiate analgesic: Secondary | ICD-10-CM

## 2024-03-07 LAB — POCT GLYCOSYLATED HEMOGLOBIN (HGB A1C): HbA1c POC (<> result, manual entry): >14 % (ref 4.0–5.6)

## 2024-03-07 LAB — GLUCOSE, CAPILLARY: Glucose-Capillary: 276 mg/dL — ABNORMAL HIGH (ref 70–99)

## 2024-03-07 MED ORDER — MOUNJARO 5 MG/0.5ML ~~LOC~~ SOAJ: 5.0000 mg | SUBCUTANEOUS | 5 refills | Status: DC

## 2024-03-07 MED ORDER — HYDROCODONE-ACETAMINOPHEN 7.5-325 MG PO TABS: 1.0000 | ORAL_TABLET | Freq: Two times a day (BID) | ORAL | 0 refills | Status: DC | PRN

## 2024-03-07 MED ORDER — MOUNJARO 2.5 MG/0.5ML ~~LOC~~ SOAJ: 2.5000 mg | SUBCUTANEOUS | 0 refills | Status: AC

## 2024-03-07 MED ORDER — SPIRIVA RESPIMAT 2.5 MCG/ACT IN AERS: 2.0000 | INHALATION_SPRAY | Freq: Every day | RESPIRATORY_TRACT | 11 refills | Status: AC

## 2024-03-07 MED ORDER — AMOXICILLIN-POT CLAVULANATE 875-125 MG PO TABS: 1.0000 | ORAL_TABLET | Freq: Two times a day (BID) | ORAL | 0 refills | Status: AC

## 2024-03-07 MED ORDER — ALBUTEROL SULFATE HFA 108 (90 BASE) MCG/ACT IN AERS: 2.0000 | INHALATION_SPRAY | Freq: Four times a day (QID) | RESPIRATORY_TRACT | 3 refills | Status: DC | PRN

## 2024-03-07 NOTE — Patient Instructions (Signed)
 Increase Lantus  to 65 units a day Increase Fisap to 18 units with each meal.  I want you to start taking Spirivia every day for your breathing.  And I am starting you on Mounjaro for your diabetes.

## 2024-03-07 NOTE — Progress Notes (Unsigned)
   Established Patient Office Visit  Subjective   Patient ID: Riley Jones, male    DOB: 02-10-1956  Age: 68 y.o. MRN: [id number]  No chief complaint on file.       Objective:     There were no vitals taken for this visit. BP Readings from Last 3 Encounters:  11/22/23 135/80  09/09/23 124/62  08/05/23 (!) 145/85   Wt Readings from Last 3 Encounters:  09/09/23 203 lb 3.7 oz (92.2 kg)  08/05/23 203 lb 3.2 oz (92.2 kg)  06/29/23 202 lb (91.6 kg)      Physical Exam   No results found for any visits on 03/07/24.  Last CBC Lab Results  Component Value Date   WBC 6.2 06/15/2023   HGB 15.3 06/15/2023   HCT 47.0 06/15/2023   MCV 91.3 06/15/2023   MCH 29.7 06/15/2023   RDW 13.5 06/15/2023   PLT 46 (L) 06/15/2023   Last metabolic panel Lab Results  Component Value Date   GLUCOSE 165 (H) 06/15/2023   NA 138 06/15/2023   K 4.2 06/15/2023   CL 99 06/15/2023   CO2 27 06/15/2023   BUN 15 06/15/2023   CREATININE 0.89 06/15/2023   EGFR 93 06/15/2023   CALCIUM  10.6 (H) 06/15/2023   PHOS 3.5 10/07/2011   PROT 7.0 06/15/2023   ALBUMIN 4.4 01/15/2020   BILITOT 0.8 06/15/2023   ALKPHOS 90 01/15/2020   AST 21 06/15/2023   ALT 22 06/15/2023   ANIONGAP 8 02/28/2017   Last hemoglobin A1c Lab Results  Component Value Date   HGBA1C 10.1 (A) 11/22/2023      The 10-year ASCVD risk score (Arnett DK, et al., 2019) is: 32.5%    Assessment & Plan:   Problem List Items Addressed This Visit       Endocrine   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    Priscella Brooms, DO

## 2024-03-08 NOTE — Assessment & Plan Note (Signed)
 Check urine tox screen. Pain roughly stable a bit worse neuropathy which may be worsened by his uncontrolled diabetes we will continue current pain medications.

## 2024-03-08 NOTE — Assessment & Plan Note (Signed)
 Only had expired albuterol  rescue inhaler.  Recent inhaler and added daily Spiriva

## 2024-03-08 NOTE — Assessment & Plan Note (Signed)
 Advised use of steroid cream to lower extremity rash.

## 2024-03-08 NOTE — Assessment & Plan Note (Signed)
 Will have him increase Lantus  to 65 units daily Increase Fiasp  to 18 units with meals With his weight gain and increased appetite I would like to try to get him back on a GLP 1 receptor agonist we will try low-dose Mounjaro if approved by insurance.

## 2024-03-08 NOTE — Assessment & Plan Note (Signed)
 A bit more elevated may be due to acute sinusitis illness, eating more salt with beans or weight gain.  Addressing these underlying issues no change to his BP meds

## 2024-03-13 ENCOUNTER — Other Ambulatory Visit

## 2024-03-13 ENCOUNTER — Other Ambulatory Visit: Payer: Self-pay

## 2024-03-13 ENCOUNTER — Other Ambulatory Visit: Payer: Self-pay | Admitting: Internal Medicine

## 2024-03-13 DIAGNOSIS — Z79899 Other long term (current) drug therapy: Secondary | ICD-10-CM

## 2024-03-13 DIAGNOSIS — B20 Human immunodeficiency virus [HIV] disease: Secondary | ICD-10-CM

## 2024-03-13 DIAGNOSIS — Z113 Encounter for screening for infections with a predominantly sexual mode of transmission: Secondary | ICD-10-CM | POA: Diagnosis not present

## 2024-03-14 ENCOUNTER — Other Ambulatory Visit

## 2024-03-14 LAB — C. TRACHOMATIS/N. GONORRHOEAE RNA
C. trachomatis RNA, TMA: NOT DETECTED
N. gonorrhoeae RNA, TMA: NOT DETECTED

## 2024-03-15 LAB — CBC WITH DIFFERENTIAL/PLATELET
Absolute Lymphocytes: 1299 {cells}/uL (ref 850–3900)
Absolute Monocytes: 490 {cells}/uL (ref 200–950)
Basophils Absolute: 69 {cells}/uL (ref 0–200)
Basophils Relative: 1.4 %
Eosinophils Absolute: 162 {cells}/uL (ref 15–500)
Eosinophils Relative: 3.3 %
HCT: 43.3 % (ref 38.5–50.0)
Hemoglobin: 14.1 g/dL (ref 13.2–17.1)
MCH: 28.8 pg (ref 27.0–33.0)
MCHC: 32.6 g/dL (ref 32.0–36.0)
MCV: 88.5 fL (ref 80.0–100.0)
MPV: 11.3 fL (ref 7.5–12.5)
Monocytes Relative: 10 %
Neutro Abs: 2881 {cells}/uL (ref 1500–7800)
Neutrophils Relative %: 58.8 %
Platelets: 63 10*3/uL — ABNORMAL LOW (ref 140–400)
RBC: 4.89 10*6/uL (ref 4.20–5.80)
RDW: 13.5 % (ref 11.0–15.0)
Total Lymphocyte: 26.5 %
WBC: 4.9 10*3/uL (ref 3.8–10.8)

## 2024-03-15 LAB — T-HELPER CELLS (CD4) COUNT (NOT AT ARMC)
Absolute CD4: 647 {cells}/uL (ref 490–1740)
CD4 T Helper %: 46 % (ref 30–61)
Total lymphocyte count: 1415 {cells}/uL (ref 850–3900)

## 2024-03-15 LAB — COMPLETE METABOLIC PANEL WITHOUT GFR
AG Ratio: 2 (calc) (ref 1.0–2.5)
ALT: 36 U/L (ref 9–46)
AST: 27 U/L (ref 10–35)
Albumin: 4.3 g/dL (ref 3.6–5.1)
Alkaline phosphatase (APISO): 81 U/L (ref 35–144)
BUN: 22 mg/dL (ref 7–25)
CO2: 22 mmol/L (ref 20–32)
Calcium: 9.4 mg/dL (ref 8.6–10.3)
Chloride: 99 mmol/L (ref 98–110)
Creat: 1.12 mg/dL (ref 0.70–1.35)
Globulin: 2.2 g/dL (ref 1.9–3.7)
Glucose, Bld: 358 mg/dL — ABNORMAL HIGH (ref 65–99)
Potassium: 4.1 mmol/L (ref 3.5–5.3)
Sodium: 135 mmol/L (ref 135–146)
Total Bilirubin: 0.5 mg/dL (ref 0.2–1.2)
Total Protein: 6.5 g/dL (ref 6.1–8.1)

## 2024-03-15 LAB — LIPID PANEL
Cholesterol: 169 mg/dL (ref <200)
HDL: 40 mg/dL (ref >=40)
LDL Cholesterol (Calc): 81 mg/dL
Non-HDL Cholesterol (Calc): 129 mg/dL (ref <130)
Total CHOL/HDL Ratio: 4.2 (calc) (ref <5.0)
Triglycerides: 396 mg/dL — ABNORMAL HIGH (ref <150)

## 2024-03-15 LAB — HIV-1 RNA QUANT-NO REFLEX-BLD
HIV 1 RNA Quant: <20 {copies}/mL — AB
HIV-1 RNA Quant, Log: <1.3 {Log_copies}/mL — AB

## 2024-03-15 LAB — RPR: RPR Ser Ql: NONREACTIVE

## 2024-03-15 LAB — TOXASSURE SELECT,+ANTIDEPR,UR

## 2024-03-27 ENCOUNTER — Encounter: Payer: Self-pay | Admitting: Internal Medicine

## 2024-03-27 ENCOUNTER — Ambulatory Visit (INDEPENDENT_AMBULATORY_CARE_PROVIDER_SITE_OTHER): Admitting: Internal Medicine

## 2024-03-27 VITALS — BP 147/77 | HR 74 | Temp 97.9°F | Ht 70.0 in | Wt 222.4 lb

## 2024-03-27 DIAGNOSIS — L409 Psoriasis, unspecified: Secondary | ICD-10-CM

## 2024-03-27 DIAGNOSIS — D696 Thrombocytopenia, unspecified: Secondary | ICD-10-CM | POA: Diagnosis not present

## 2024-03-27 DIAGNOSIS — I872 Venous insufficiency (chronic) (peripheral): Secondary | ICD-10-CM | POA: Diagnosis not present

## 2024-03-27 DIAGNOSIS — M069 Rheumatoid arthritis, unspecified: Secondary | ICD-10-CM | POA: Diagnosis not present

## 2024-03-27 DIAGNOSIS — E11618 Type 2 diabetes mellitus with other diabetic arthropathy: Secondary | ICD-10-CM

## 2024-03-27 DIAGNOSIS — E1142 Type 2 diabetes mellitus with diabetic polyneuropathy: Secondary | ICD-10-CM

## 2024-03-27 DIAGNOSIS — R600 Localized edema: Secondary | ICD-10-CM | POA: Diagnosis not present

## 2024-03-27 DIAGNOSIS — Z794 Long term (current) use of insulin: Secondary | ICD-10-CM

## 2024-03-27 DIAGNOSIS — R0600 Dyspnea, unspecified: Secondary | ICD-10-CM | POA: Diagnosis not present

## 2024-03-27 DIAGNOSIS — I10 Essential (primary) hypertension: Secondary | ICD-10-CM

## 2024-03-27 DIAGNOSIS — M05741 Rheumatoid arthritis with rheumatoid factor of right hand without organ or systems involvement: Secondary | ICD-10-CM

## 2024-03-27 MED ORDER — BETAMETHASONE DIPROPIONATE 0.05 % EX CREA: TOPICAL_CREAM | Freq: Two times a day (BID) | CUTANEOUS | 1 refills | Status: AC

## 2024-03-27 NOTE — Assessment & Plan Note (Signed)
 He does have some LE swelling and weight has continued to go up.  Admits to some DOE.  Will check a BNP and reflex to an echo if elevated but I suspect his LE edema is more likely due to venous reflux.

## 2024-03-27 NOTE — Assessment & Plan Note (Signed)
 Mildly elevated, will have him monitor with home BP readings.

## 2024-03-27 NOTE — Assessment & Plan Note (Signed)
 Repeat CBC

## 2024-03-27 NOTE — Assessment & Plan Note (Signed)
 Will try stronger potentcy cream for now, given his history of RA I would like him to be seeing a rheumatologist.

## 2024-03-27 NOTE — Assessment & Plan Note (Signed)
 Will see if we can get him plugged in with rheumatology locally with Dr Jeannetta, referral placed

## 2024-03-27 NOTE — Patient Instructions (Signed)
 Keep up the good work on your diabetes, increase mounjaro  to 5mg  pens when the 2.5mg  pens run out.  I am going to send you to the rheumatologist.  I am getting some blood work today and may obtain an echocardiogram.  Iwant you to call me if you weight continues to rise or you become more short of breath.

## 2024-03-27 NOTE — Progress Notes (Signed)
 Established Patient Office Visit  Subjective   Patient ID: Riley Jones, male    DOB: 15-Jun-1956  Age: 68 y.o. MRN: [id number]  Chief Complaint  Patient presents with   Follow-up   He predominately for follow up on diabetes.  Has been adherent to his insulin  taking 80 units lantus  and 18 units fiasp  with meals.  Able to pick up mounjaro  and taking, only side effect was some diarrhea which was controlled with imodium.  Thinks his sugars have improved.  No lows.  For his rash on his lower leg he has seen no change.  He previously was on methotrexate for his RA but thinks he feel out of seeing rheum during the pandemic and never resumed.  He wants a rheumatologist that is close by and in network.  He has a BP cuff at home but generally has not used it.     Objective:     BP (!) 147/77 (BP Location: Left Arm, Patient Position: Sitting, Cuff Size: Large)   Pulse 74   Temp 97.9 F (36.6 C) (Oral)   Ht 5' 10 (1.778 m)   Wt 222 lb 6.4 oz (100.9 kg)   SpO2 98% Comment: RA  BMI 31.91 kg/m  BP Readings from Last 3 Encounters:  03/27/24 (!) 147/77  03/07/24 (!) 144/67  11/22/23 135/80   Wt Readings from Last 3 Encounters:  03/27/24 222 lb 6.4 oz (100.9 kg)  03/07/24 216 lb (98 kg)  09/09/23 203 lb 3.7 oz (92.2 kg)      Physical Exam Constitutional:      Appearance: Normal appearance.   Cardiovascular:     Rate and Rhythm: Normal rate and regular rhythm.  Pulmonary:     Effort: Pulmonary effort is normal.   Skin:    Comments: Large scaly plaque left lateral lower leg.   Neurological:     Mental Status: He is alert.      No results found for any visits on 03/27/24.  Last CBC Lab Results  Component Value Date   WBC 4.9 03/13/2024   HGB 14.1 03/13/2024   HCT 43.3 03/13/2024   MCV 88.5 03/13/2024   MCH 28.8 03/13/2024   RDW 13.5 03/13/2024   PLT 63 (L) 03/13/2024   Last metabolic panel Lab Results  Component Value Date   GLUCOSE 358 (H) 03/13/2024   NA  135 03/13/2024   K 4.1 03/13/2024   CL 99 03/13/2024   CO2 22 03/13/2024   BUN 22 03/13/2024   CREATININE 1.12 03/13/2024   EGFR 93 06/15/2023   CALCIUM  9.4 03/13/2024   PHOS 3.5 10/07/2011   PROT 6.5 03/13/2024   ALBUMIN 4.4 01/15/2020   BILITOT 0.5 03/13/2024   ALKPHOS 90 01/15/2020   AST 27 03/13/2024   ALT 36 03/13/2024   ANIONGAP 8 02/28/2017   Last lipids Lab Results  Component Value Date   CHOL 169 03/13/2024   HDL 40 03/13/2024   LDLCALC 81 03/13/2024   TRIG 396 (H) 03/13/2024   CHOLHDL 4.2 03/13/2024      The 10-year ASCVD risk score (Arnett DK, et al., 2019) is: 42.1%    Assessment & Plan:   Problem List Items Addressed This Visit       Cardiovascular and Mediastinum   Venous reflux (Chronic)   He does have some LE swelling and weight has continued to go up.  Admits to some DOE.  Will check a BNP and reflex to an echo if elevated but I suspect  his LE edema is more likely due to venous reflux.      Essential hypertension (Chronic)   Mildly elevated, will have him monitor with home BP readings.        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 The average reading was 197, % time in target was 48, % time below target was 0, and % time above target was. 52. Intervention will be to increase mounjaro .   He has made dramatic improvement.       Relevant Orders   Microalbumin / creatinine urine ratio     Musculoskeletal and Integument   Rheumatoid arthritis (HCC)   Will see if we can get him plugged in with rheumatology locally with Dr Jeannetta, referral placed      Relevant Orders   Ambulatory referral to Rheumatology   Psoriasis   Will try stronger potentcy cream for now, given his history of RA I would like him to be seeing a rheumatologist.      Relevant Orders   Ambulatory referral to Rheumatology     Other   Low platelet count (HCC)   Repeat CBC       Relevant Orders   CBC no Diff   Other Visit Diagnoses       Type 2 diabetes mellitus with other diabetic arthropathy, with long-term current use of insulin  (HCC)       Relevant Orders   BMP8+Anion Gap     Bilateral lower extremity edema       Relevant Orders   Brain natriuretic peptide       Return in about 2 months (around 05/27/2024) for DM, HTN.    Riley JAYSON Eastern, DO

## 2024-03-27 NOTE — Assessment & Plan Note (Addendum)
 Riley Jones wore the CGM for 14 days. The average reading was 197, % time in target was 48, % time below target was 0, and % time above target was. 52. Intervention will be to increase mounjaro .   He has made dramatic improvement.

## 2024-03-28 ENCOUNTER — Ambulatory Visit: Payer: Self-pay | Admitting: Internal Medicine

## 2024-03-28 ENCOUNTER — Other Ambulatory Visit: Payer: Self-pay

## 2024-03-28 ENCOUNTER — Encounter: Payer: Self-pay | Admitting: Internal Medicine

## 2024-03-28 VITALS — BP 149/75 | HR 74 | Temp 97.4°F | Wt 220.0 lb

## 2024-03-28 DIAGNOSIS — L409 Psoriasis, unspecified: Secondary | ICD-10-CM

## 2024-03-28 DIAGNOSIS — B20 Human immunodeficiency virus [HIV] disease: Secondary | ICD-10-CM | POA: Diagnosis not present

## 2024-03-28 DIAGNOSIS — Z1159 Encounter for screening for other viral diseases: Secondary | ICD-10-CM

## 2024-03-28 DIAGNOSIS — R0600 Dyspnea, unspecified: Secondary | ICD-10-CM

## 2024-03-28 LAB — MICROALBUMIN / CREATININE URINE RATIO
Creatinine, Urine: 16.5 mg/dL
Microalb/Creat Ratio: <18 mg/g{creat} (ref 0–29)
Microalbumin, Urine: <3 ug/mL

## 2024-03-28 LAB — BMP8+ANION GAP
Anion Gap: 18 mmol/L (ref 10.0–18.0)
BUN/Creatinine Ratio: 16 (ref 10–24)
BUN: 17 mg/dL (ref 8–27)
CO2: 21 mmol/L (ref 20–29)
Calcium: 10.1 mg/dL (ref 8.6–10.2)
Chloride: 99 mmol/L (ref 96–106)
Creatinine, Ser: 1.08 mg/dL (ref 0.76–1.27)
Glucose: 177 mg/dL — ABNORMAL HIGH (ref 70–99)
Potassium: 3.9 mmol/L (ref 3.5–5.2)
Sodium: 138 mmol/L (ref 134–144)
eGFR: 74 mL/min/{1.73_m2} (ref >59)

## 2024-03-28 LAB — CBC
Hematocrit: 43.6 % (ref 37.5–51.0)
Hemoglobin: 14.2 g/dL (ref 13.0–17.7)
MCH: 28.3 pg (ref 26.6–33.0)
MCHC: 32.6 g/dL (ref 31.5–35.7)
MCV: 87 fL (ref 79–97)
Platelets: 124 10*3/uL — ABNORMAL LOW (ref 150–450)
RBC: 5.02 x10E6/uL (ref 4.14–5.80)
RDW: 13.6 % (ref 11.6–15.4)
WBC: 5.2 10*3/uL (ref 3.4–10.8)

## 2024-03-28 LAB — BRAIN NATRIURETIC PEPTIDE: BNP: 20.4 pg/mL (ref 0.0–100.0)

## 2024-03-28 MED ORDER — BICTEGRAVIR-EMTRICITAB-TENOFOV 50-200-25 MG PO TABS: 1.0000 | ORAL_TABLET | Freq: Every day | ORAL | 11 refills | Status: DC

## 2024-03-28 NOTE — Progress Notes (Signed)
 [facility]   CHIEF COMPLAINT    HIV follow up.   SUBJECTIVE:    Riley Jones is a 68 y.o. male with PMHx as below who presents to the clinic for HIV follow up.   03/28/24 id clinic f/u He is doing well on biktarvy ; no missed dose Dyspnea chronic beign w/u'ed with pcp See a&p   06/29/23 id clinic f/u Patient previously sees dr Prentiss. First visit with me today  Saw ed 3 weeks ago for acute back pain given methylprednisone burst and flexeril  -- twisted his back jumping back to avoid an object falling on him at work. Still painful not better. No lower ext neurological deficit  Has chronic left lower abd pain due to ozempic  injection  He has no complaints Labs recently done and we reviewed Reviewed meds/medical problems -- dm2, RA, angina/cad Please see A&P for the details of today's visit and status of the patient's medical problems.   Patient's Medications  New Prescriptions   No medications on file  Previous Medications   ALBUTEROL  (VENTOLIN  HFA) 108 (90 BASE) MCG/ACT INHALER    Inhale 2 puffs into the lungs every 6 (six) hours as needed for wheezing or shortness of breath.   ASPIRIN  81 MG EC TABLET    Take 81 mg by mouth daily.     ATORVASTATIN  (LIPITOR) 40 MG TABLET    Take 1 tablet by mouth once daily   BETAMETHASONE  DIPROPIONATE 0.05 % CREAM    Apply topically 2 (two) times daily.   BIKTARVY  50-200-25 MG TABS TABLET    Take 1 tablet by mouth once daily   CARVEDILOL  (COREG ) 3.125 MG TABLET    Take 1 tablet (3.125 mg total) by mouth 2 (two) times daily with a meal.   CICLOPIROX (PENLAC) 8 % SOLUTION    Apply topically at bedtime. Apply over nail and surrounding skin. Apply daily over previous coat. After seven (7) days, may remove with alcohol and continue cycle.   CLOPIDOGREL  (PLAVIX ) 75 MG TABLET    TAKE 1 TABLET BY MOUTH EVERY DAY   DERMATOLOGICAL PRODUCTS, MISC. (KERASAL FUNGAL NAIL RENEWAL EX)    Apply topically.    EMPAGLIFLOZIN -METFORMIN  HCL ER (SYNJARDY  XR) 12.01-999 MG TB24    Take 1 tablet by mouth 2 (two) times daily.   HYDROCODONE -ACETAMINOPHEN  (NORCO) 7.5-325 MG TABLET    Take 1 tablet by mouth 2 (two) times daily as needed for moderate pain (pain score 4-6).   HYDROCODONE -ACETAMINOPHEN  (NORCO) 7.5-325 MG TABLET    Take 1 tablet by mouth 2 (two) times daily as needed for moderate pain (pain score 4-6).   HYDROCODONE -ACETAMINOPHEN  (NORCO) 7.5-325 MG TABLET    Take 1 tablet by mouth 2 (two) times daily as needed for moderate pain (pain score 4-6).   INSULIN  ASPART (FIASP  FLEXTOUCH) 100 UNIT/ML FLEXTOUCH PEN    Inject 12 Units into the skin 3 (three) times daily with meals.   INSULIN  GLARGINE (LANTUS  SOLOSTAR) 100 UNIT/ML SOLOSTAR PEN    Inject 55-65 Units into the skin daily at 10 pm.   INSULIN  PEN NEEDLE (BD PEN NEEDLE MICRO U/F) 32G X 6 MM MISC    1 Units by Does not apply route 4 (four) times daily.   LORATADINE  (CLARITIN ) 10 MG TABLET    Take 1 tablet (10 mg total) by mouth daily.   LOSARTAN -HYDROCHLOROTHIAZIDE  (HYZAAR ) 100-25 MG TABLET    Take 1 tablet by mouth daily.   MULTIPLE VITAMINS-MINERALS (CENTRUM  ADULT 50+ MULTIGUMMIES PO)    Take by mouth.   MULTIPLE VITAMINS-MINERALS (CENTRUM ADULTS PO)    Take 1 tablet by mouth daily.   NITROGLYCERIN  (NITROSTAT ) 0.4 MG SL TABLET    PLACE 1 TABLET UNDER THE TONGUE EVERY 5 MINUTES AS NEEDED FOR CHEST PAIN   OMEPRAZOLE  (PRILOSEC) 20 MG CAPSULE    TAKE 1 CAPSULE BY MOUTH AS NEEDED   TADALAFIL  (CIALIS ) 5 MG TABLET    Take 1 tablet (5 mg total) by mouth daily.   TAMSULOSIN  (FLOMAX ) 0.4 MG CAPS CAPSULE    Take 1 capsule (0.4 mg total) by mouth daily.   TESTOSTERONE  1.62 % GEL    Place 2 Pump onto the skin daily. Each shoulder   TIOTROPIUM BROMIDE MONOHYDRATE  (SPIRIVA  RESPIMAT) 2.5 MCG/ACT AERS    Inhale 2 puffs into the lungs daily.   TIRZEPATIDE  (MOUNJARO ) 2.5 MG/0.5ML PEN    Inject 2.5 mg into the skin once a week for 28 days.   TIRZEPATIDE  (MOUNJARO ) 5  MG/0.5ML PEN    Inject 5 mg into the skin once a week.  Modified Medications   No medications on file  Discontinued Medications   No medications on file      Past Medical History:  Diagnosis Date   Anxiety    Arthritis    pretty much all over   Asthma    mild (07/17/2014)   Atypical angina (HCC)    Myoview  2003, Normal EF 64%   CAD (coronary artery disease)    a. 10/20 DES to 80% mid LAD   Candida rash of groin 11/03/2019   COPD (chronic obstructive pulmonary disease) (HCC)    mild (07/17/2014)   Depression    Fatigue 06/22/2019   Foot pain 03/30/2011   GERD (gastroesophageal reflux disease)    H/O hiatal hernia    Headache    weekly (06/2014)   History of gout    HIV (human immunodeficiency virus infection) (HCC) 1995   DX after shingles followed by Dr. Epifanio (ID)   Hx of cardiovascular stress test    ETT-Myoview  (3/16):  Ex 10:15, No Ischemia, EF 57%; Normal Study   Hyperlipidemia    Hypertension    Well Controlled off meds   Increased anion gap metabolic acidosis 01/22/2021   Kidney stones    passed them all   Left lower quadrant abdominal pain 11/02/2019   Other and unspecified angina pectoris 12/18/2008   Overview:  Overview:  Greatly appreciate PCP f/u.   Last Assessment & Plan:  - Patient with episode of CP - 4 days, constant, substernal, not immediately resolved with NTG, felt better after burping and passing gas - Repeat EKG done here with no acute ST/T wave changes - CP now completely resolved. Encouraged patient to call 911 if recurs - To f/u with cardio in the next 2 weeks for further assess   Pneumonia 4-5 times   Rheumatoid arthritis(714.0)    Followed by Dr. Everlean, off MTX and prdnisone since 2007   Right hip pain 04/25/2019   Right shoulder pain 01/19/2018   Shingles (herpes zoster) polyneuropathy 11/08/2018   Sleep apnea    does not wear mask (07/17/2014)   Type II diabetes mellitus (HCC)     Social History   Tobacco Use   Smoking status:  Former    Current packs/day: 0.00    Average packs/day: 1 pack/day for 46.0 years (46.0 ttl pk-yrs)    Types: Cigarettes    Start date: 12/28/1975  Quit date: 12/27/2021    Years since quitting: 2.2   Smokeless tobacco: Never  Vaping Use   Vaping status: Never Used  Substance Use Topics   Alcohol use: Not Currently    Alcohol/week: 1.0 standard drink of alcohol    Types: 1 Glasses of wine per week    Comment: 07/17/2014 glass of wine once or twice/month   Drug use: No    Family History  Problem Relation Age of Onset   Osteoporosis Mother    Diabetes Mother    Heart disease Father    Heart attack Father    Diabetes Father    COPD Father    Heart attack Brother    Stroke Maternal Grandmother    Stroke Maternal Grandfather    Stroke Paternal Grandmother    Stroke Paternal Grandfather     Allergies  Allergen Reactions   Penicillins Hives, Nausea Only and Rash    Has patient had a PCN reaction causing immediate rash, facial/tongue/throat swelling, SOB or lightheadedness with hypotension: Yes Has patient had a PCN reaction causing severe rash involving mucus membranes or skin necrosis: No Has patient had a PCN reaction that required hospitalization: No Has patient had a PCN reaction occurring within the last 10 years: No If all of the above answers are NO, then may proceed with Cephalosporin use.    Zoloft  [Sertraline  Hcl] Other (See Comments)    Patient reported Psychomotor slowing / worsened depression   Victoza  [Liraglutide ]     Did not tolerate this medication.  Complained of dizziness even at the lowest dose   Chantix  [Varenicline ] Hives   Gabapentin  Itching   Lisinopril  Cough   Ozempic  (0.25 Or 0.5 Mg-Dose) [Semaglutide (0.25 Or 0.5mg -Dos)] Itching and Rash    Rash to chest wall.   Tresiba  [Insulin  Degludec] Rash    ROS: All other ros negative   OBJECTIVE:    Vitals:   03/28/24 1007  BP: (!) 149/75  Pulse: 74  Temp: (!) 97.4 F (36.3 C)  TempSrc:  Oral  SpO2: 95%  Weight: 220 lb (99.8 kg)     Body mass index is 31.57 kg/m.  Physical Exam Constitutional:      Appearance: Normal appearance.  HENT:     Head: Normocephalic and atraumatic.   Eyes:     Extraocular Movements: Extraocular movements intact.     Conjunctiva/sclera: Conjunctivae normal.   Pulmonary:     Effort: Pulmonary effort is normal. No respiratory distress.  Abdominal:     General: There is no distension.     Palpations: Abdomen is soft.   Skin:    General: Skin is warm and dry.     Comments: Left lateral ankle with superficial scab about 1cm.  No drainage, redness, tenderness.  Appears c/w possible psoriatic plaque.    Neurological:     General: No focal deficit present.     Mental Status: He is alert and oriented to person, place, and time.   Psychiatric:        Mood and Affect: Mood normal.        Behavior: Behavior normal.     Labs and Microbiology:    Latest Ref Rng & Units 03/27/2024   10:00 AM 03/13/2024    1:52 PM 06/15/2023    8:36 AM  CMP  Glucose 70 - 99 mg/dL 822  641  834   BUN 8 - 27 mg/dL 17  22  15    Creatinine 0.76 - 1.27 mg/dL 8.91  8.87  9.10  Sodium 134 - 144 mmol/L 138  135  138   Potassium 3.5 - 5.2 mmol/L 3.9  4.1  4.2   Chloride 96 - 106 mmol/L 99  99  99   CO2 20 - 29 mmol/L 21  22  27    Calcium  8.6 - 10.2 mg/dL 89.8  9.4  89.3   Total Protein 6.1 - 8.1 g/dL  6.5  7.0   Total Bilirubin 0.2 - 1.2 mg/dL  0.5  0.8   AST 10 - 35 U/L  27  21   ALT 9 - 46 U/L  36  22       Latest Ref Rng & Units 03/27/2024   10:00 AM 03/13/2024    1:52 PM 06/15/2023    8:36 AM  CBC  WBC 3.4 - 10.8 x10E3/uL 5.2  4.9  6.2   Hemoglobin 13.0 - 17.7 g/dL 85.7  85.8  84.6   Hematocrit 37.5 - 51.0 % 43.6  43.3  47.0   Platelets 150 - 450 x10E3/uL 124  63  46      Lab Results  Component Value Date   HIV1RNAQUANT <20 DETECTED (A) 03/13/2024   HIV1RNAQUANT 45 (H) 06/15/2023   HIV1RNAQUANT <20 (H) 05/26/2022   CD4TABS 583 06/15/2023    CD4TABS 704 05/26/2022   CD4TABS 848 06/11/2020    RPR and STI: Lab Results  Component Value Date   LABRPR NON-REACTIVE 03/13/2024   LABRPR NON-REACTIVE 06/15/2023   LABRPR NON-REACTIVE 05/26/2022   LABRPR NON-REACTIVE 05/16/2021   LABRPR NON-REACTIVE 06/11/2020    STI Results GC CT  06/15/2023  8:50 AM Negative  Negative   05/26/2022  9:30 AM Negative  Negative   08/08/2021 10:11 AM Negative  Negative   05/16/2021  2:34 PM Negative  Negative   06/11/2020  9:22 AM Negative  Negative   10/25/2018 12:00 AM Negative  Negative   01/19/2018 12:00 AM Negative  Negative   06/16/2017 12:00 AM Negative  Negative   03/02/2016 12:00 AM Negative  Negative     Hepatitis B: Lab Results  Component Value Date   HEPBSAB YES 11/22/2006   HEPBSAG NO 11/22/2006   Hepatitis C: No results found for: HEPCAB, HCVRNAPCRQN Hepatitis A: Lab Results  Component Value Date   HAV REACTIVE (A) 01/30/2022   Lipids: Lab Results  Component Value Date   CHOL 169 03/13/2024   TRIG 396 (H) 03/13/2024   HDL 40 03/13/2024   CHOLHDL 4.2 03/13/2024   VLDL UNABLE TO CALCULATE IF TRIGLYCERIDE OVER 400 mg/dL 93/98/7981   LDLCALC 81 03/13/2024    Imaging: Reviewed:  03/2020 abd pelv ct without contrast FINDINGS: Lower chest: Lung bases are clear. There are foci of arterial vascular calcification. There is mitral annulus calcification.   Hepatobiliary: No focal liver lesions are evident. There is cholelithiasis. There is no appreciable gallbladder wall thickening. No biliary duct dilatation.   Pancreas: There is no pancreatic mass or inflammatory focus.   Spleen: No splenic lesions are evident.   Adrenals/Urinary Tract: Adrenals bilaterally appear normal. Kidneys bilaterally show no evident mass or hydronephrosis on either side. There is no evident renal or ureteral calculus on either side. Urinary bladder is midline with wall thickness within normal limits.   Stomach/Bowel: There is no  appreciable bowel wall or mesenteric thickening. Terminal ileum appears normal. There is mild lipomatous infiltration of the ileocecal valve. No evident bowel obstruction. There are occasional sigmoid diverticula without diverticulitis. No free air or portal venous air.   Vascular/Lymphatic: There  is no abdominal aortic aneurysm. There is aortic and iliac artery atherosclerosis. No adenopathy is evident in the abdomen or pelvis.   Reproductive: Prostate is upper normal in size with configuration within normal limits. There are a few tiny prostatic calculi. Seminal vesicles appear normal bilaterally. No evident pelvic mass.   Other: Postoperative mesh noted in the anterior pelvis. There is fat in each inguinal ring. Appendix absent. No periappendiceal region inflammation evident. No evident abscess or ascites in the abdomen or pelvis.   Musculoskeletal: There is degenerative change in the lumbar spine. There are no blastic or lytic bone lesions. No intramuscular lesions are evident.   IMPRESSION: 1.  Cholelithiasis.  No gallbladder wall thickening evident.   2. Occasional sigmoid diverticula without diverticulitis. No bowel wall thickening or bowel obstruction. No free air.   3. No renal or ureteral calculus. No hydronephrosis. Urinary bladder wall thickness normal.   4. Postoperative change anterior pelvic wall. Fat noted in each inguinal ring.   5. Aortic Atherosclerosis (ICD10-I70.0). Multiple iliac artery calcifications also noted. Foci of coronary artery calcification noted.   ASSESSMENT & PLAN:     Problem List Items Addressed This Visit   None Visit Diagnoses       HIV disease (HCC)    -  Primary     Need for hepatitis B screening test         Dyspnea, unspecified type            #hiv #cad risk on atorvastatin  40 mg daily Dx'ed 1995 -- question of dental procedure transmission (dentist was arrested for infecting patients). No ivdu; no msm risk.  Cd4 <  200 at dx; frequent pneumonia and hx pcp Started treatment right at dx Therapy: 2021-c Biktarvy  Didn't like cabenuva  due to side effect Prior atripla  and other regimens he doesn't remember No history of regimen failure previously per his report   03/28/24 Compliant no missed dose last 4 weeks. Hiv viral load undetectable   -discussed u=u -encourage compliance -continue current HIV medication with biktarvy  -labs 6 months during f/u -f/u in 6 months   #dyspnea/chest pain/decreased energy Months of sx A1c really high 14 At risk for CAD though Appears pcp is aware and workup  No active chest pain during visit  -continue aspirin /statin/htn meds -f/u pcp -discuss sign of unstable angina, to go to ER    #thrombocytopenia Present chronically since 2021 03/27/24 a little higher than normal 120s With his RA which he no longer takes any disease specific meds, query if autoimmune phenomenom related to it Abd pelv ct in 2021 showed no splenomegaly  Hiv infection can be associated with itp but it is well controlled at this time on ART, not entirely clear if related at this time He is on plavix /aspirin  for CAD and reports easy bruising prolonged bleeding No hx of bleeding or bleeding diasthesis in his family No prior hematology -- but pcp appears to have been monitoring   -f/u pcp    #RA Been off specific meds for years No morning stiffness and no significant joint pain/swelling  -he is establishing care with a new rheumatologist as of 03/28/24; no active sx   #Psorasis (confirmed bx left leg biopsy) -- nails are involved On topical agents Left lateral tib/fib-ankle junction a skin flare   -continue prednisone  cream    #dm2 #obesity #peripheral neuropathy (feet) On insulin  and ozempic  (since 2022) and synjardi Not well controlled Recent A1c still very high Lab Results  Component Value Date  HGBA1C >14.0 03/07/2024   -f/u pcp   #social Lives alone He has 2  dog pets 03/28/24 he just bought a trailer but yet to use (retired 11/12/23) Not in a relationship -- last sexually active long time. 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

## 2024-03-28 NOTE — Patient Instructions (Signed)
 Hiv well controlled; continue biktary See me 9 months and get labs at visit    Main issues you have to get investigated/under control quickly: 1) dyspnea -- you are at very high risk for heart disease so make sure this get looked at within the next few weeks 2) diabetes -- A1c too high (make heart disease worse or dehydration or yeast infection in groin); please follow closely with pcp/nutritionist (also can consider seeing endocrinology)

## 2024-04-05 ENCOUNTER — Ambulatory Visit

## 2024-04-05 VITALS — Ht 70.0 in | Wt 217.0 lb

## 2024-04-05 DIAGNOSIS — Z0000 Encounter for general adult medical examination without abnormal findings: Secondary | ICD-10-CM | POA: Diagnosis not present

## 2024-04-05 NOTE — Progress Notes (Signed)
 Because this visit was a virtual/telehealth visit,  certain criteria was not obtained, such a blood pressure, CBG if applicable, and timed get up and go. Any medications not marked as taking were not mentioned during the medication reconciliation part of the visit. Any vitals not documented were not able to be obtained due to this being a telehealth visit or patient was unable to self-report a recent blood pressure reading due to a lack of equipment at home via telehealth. Vitals that have been documented are verbally provided by the patient.   Subjective:   Riley Jones is a 68 y.o. who presents for a Medicare Wellness preventive visit.  As a reminder, Annual Wellness Visits don't include a physical exam, and some assessments may be limited, especially if this visit is performed virtually. We may recommend an in-person follow-up visit with your provider if needed.  Visit Complete: Virtual I connected with  Victory JINNY Hacker on 04/05/24 by a audio enabled telemedicine application and verified that I am speaking with the correct person using two identifiers.  Patient Location: Home  Provider Location: Home Office  I discussed the limitations of evaluation and management by telemedicine. The patient expressed understanding and agreed to proceed.  Vital Signs: Because this visit was a virtual/telehealth visit, some criteria may be missing or patient reported. Any vitals not documented were not able to be obtained and vitals that have been documented are patient reported.  VideoDeclined- This patient declined Librarian, academic. Therefore the visit was completed with audio only.  Persons Participating in Visit: Patient.  AWV Questionnaire: No: Patient Medicare AWV questionnaire was not completed prior to this visit.  Cardiac Risk Factors include: advanced age (>65men, >11 women);diabetes mellitus;hypertension;male gender;obesity (BMI >30kg/m2);family history of  premature cardiovascular disease     Objective:    Today's Vitals   04/05/24 1032  Weight: 217 lb (98.4 kg)  Height: 5' 10 (1.778 m)  PainSc: 5   PainLoc: Generalized   Body mass index is 31.14 kg/m.     04/05/2024   10:36 AM 03/27/2024    8:43 AM 03/07/2024    8:51 AM 11/22/2023    8:47 AM 08/05/2023    8:44 AM 06/04/2023    3:55 PM 03/24/2023    8:31 AM  Advanced Directives  Does Patient Have a Medical Advance Directive? Yes Yes Yes Yes Yes No Yes  Type of Estate agent of [place];Living will Healthcare Power of [place];Living will Healthcare Power of [place];Living will Healthcare Power of [place];Living will Healthcare Power of [place];Living will  Healthcare Power of [place];Living will  Does patient want to make changes to medical advance directive?  No - Patient declined No - Patient declined No - Patient declined No - Patient declined    Copy of Healthcare Power of Attorney in Chart? No - copy requested No - copy requested No - copy requested No - copy requested No - copy requested  No - copy requested  Would patient like information on creating a medical advance directive?      No - Patient declined     Current Medications (verified) Outpatient Encounter Medications as of 04/05/2024  Medication Sig   albuterol  (VENTOLIN  HFA) 108 (90 Base) MCG/ACT inhaler Inhale 2 puffs into the lungs every 6 (six) hours as needed for wheezing or shortness of breath.   aspirin  81 MG EC tablet Take 81 mg by mouth daily.     atorvastatin  (LIPITOR) 40 MG tablet Take 1 tablet by  mouth once daily   betamethasone  dipropionate 0.05 % cream Apply topically 2 (two) times daily.   bictegravir-emtricitabine -tenofovir  AF (BIKTARVY ) 50-200-25 MG TABS tablet Take 1 tablet by mouth daily.   carvedilol  (COREG ) 3.125 MG tablet Take 1 tablet (3.125 mg total) by mouth 2 (two) times daily with a meal.   ciclopirox (PENLAC) 8 % solution Apply topically at bedtime. Apply over nail and  surrounding skin. Apply daily over previous coat. After seven (7) days, may remove with alcohol and continue cycle.   clopidogrel  (PLAVIX ) 75 MG tablet TAKE 1 TABLET BY MOUTH EVERY DAY   Dermatological Products, Misc. (KERASAL FUNGAL NAIL RENEWAL EX) Apply topically.   Empagliflozin -metFORMIN  HCl ER (SYNJARDY  XR) 12.01-999 MG TB24 Take 1 tablet by mouth 2 (two) times daily.   [START ON 05/03/2024] HYDROcodone -acetaminophen  (NORCO) 7.5-325 MG tablet Take 1 tablet by mouth 2 (two) times daily as needed for moderate pain (pain score 4-6).   [START ON 06/02/2024] HYDROcodone -acetaminophen  (NORCO) 7.5-325 MG tablet Take 1 tablet by mouth 2 (two) times daily as needed for moderate pain (pain score 4-6).   HYDROcodone -acetaminophen  (NORCO) 7.5-325 MG tablet Take 1 tablet by mouth 2 (two) times daily as needed for moderate pain (pain score 4-6).   insulin  aspart (FIASP  FLEXTOUCH) 100 UNIT/ML FlexTouch Pen Inject 12 Units into the skin 3 (three) times daily with meals. (Patient taking differently: Inject 14 Units into the skin 3 (three) times daily with meals.)   insulin  glargine (LANTUS  SOLOSTAR) 100 UNIT/ML Solostar Pen Inject 55-65 Units into the skin daily at 10 pm. (Patient taking differently: Inject 80 Units into the skin daily at 10 pm.)   Insulin  Pen Needle (BD PEN NEEDLE MICRO U/F) 32G X 6 MM MISC 1 Units by Does not apply route 4 (four) times daily.   loratadine  (CLARITIN ) 10 MG tablet Take 1 tablet (10 mg total) by mouth daily.   losartan -hydrochlorothiazide  (HYZAAR ) 100-25 MG tablet Take 1 tablet by mouth daily.   Multiple Vitamins-Minerals (CENTRUM ADULT 50+ MULTIGUMMIES PO) Take by mouth.   Multiple Vitamins-Minerals (CENTRUM ADULTS PO) Take 1 tablet by mouth daily.   nitroGLYCERIN  (NITROSTAT ) 0.4 MG SL tablet PLACE 1 TABLET UNDER THE TONGUE EVERY 5 MINUTES AS NEEDED FOR CHEST PAIN   omeprazole  (PRILOSEC) 20 MG capsule TAKE 1 CAPSULE BY MOUTH AS NEEDED   tadalafil  (CIALIS ) 5 MG tablet Take 1 tablet  (5 mg total) by mouth daily.   tamsulosin  (FLOMAX ) 0.4 MG CAPS capsule Take 1 capsule (0.4 mg total) by mouth daily.   Testosterone  1.62 % GEL Place 2 Pump onto the skin daily. Each shoulder   Tiotropium Bromide Monohydrate  (SPIRIVA  RESPIMAT) 2.5 MCG/ACT AERS Inhale 2 puffs into the lungs daily.   [EXPIRED] tirzepatide  (MOUNJARO ) 2.5 MG/0.5ML Pen Inject 2.5 mg into the skin once a week for 28 days.   tirzepatide  (MOUNJARO ) 5 MG/0.5ML Pen Inject 5 mg into the skin once a week.   No facility-administered encounter medications on file as of 04/05/2024.    Allergies (verified) Penicillins, Zoloft  [sertraline  hcl], Victoza  [liraglutide ], Chantix  [varenicline ], Gabapentin , Lisinopril , Ozempic  (0.25 or 0.5 mg-dose) [semaglutide (0.25 or 0.5mg -dos)], and Tresiba  [insulin  degludec]   History: Past Medical History:  Diagnosis Date   Anxiety    Arthritis    pretty much all over   Asthma    mild (07/17/2014)   Atypical angina (HCC)    Myoview  2003, Normal EF 64%   CAD (coronary artery disease)    a. 10/20 DES to 80% mid LAD   Candida rash  of groin 11/03/2019   COPD (chronic obstructive pulmonary disease) (HCC)    mild (07/17/2014)   Depression    Fatigue 06/22/2019   Foot pain 03/30/2011   GERD (gastroesophageal reflux disease)    H/O hiatal hernia    Headache    weekly (06/2014)   History of gout    HIV (human immunodeficiency virus infection) (HCC) 1995   DX after shingles followed by Dr. Epifanio (ID)   Hx of cardiovascular stress test    ETT-Myoview  (3/16):  Ex 10:15, No Ischemia, EF 57%; Normal Study   Hyperlipidemia    Hypertension    Well Controlled off meds   Increased anion gap metabolic acidosis 01/22/2021   Kidney stones    passed them all   Left lower quadrant abdominal pain 11/02/2019   Other and unspecified angina pectoris 12/18/2008   Overview:  Overview:  Greatly appreciate PCP f/u.   Last Assessment & Plan:  - Patient with episode of CP - 4 days, constant,  substernal, not immediately resolved with NTG, felt better after burping and passing gas - Repeat EKG done here with no acute ST/T wave changes - CP now completely resolved. Encouraged patient to call 911 if recurs - To f/u with cardio in the next 2 weeks for further assess   Pneumonia 4-5 times   Rheumatoid arthritis(714.0)    Followed by Dr. Everlean, off MTX and prdnisone since 2007   Right hip pain 04/25/2019   Right shoulder pain 01/19/2018   Shingles (herpes zoster) polyneuropathy 11/08/2018   Sleep apnea    does not wear mask (07/17/2014)   Type II diabetes mellitus (HCC)    Past Surgical History:  Procedure Laterality Date   APPENDECTOMY  ~ 1981   CARPAL TUNNEL RELEASE Left 1990's   CORONARY ANGIOPLASTY WITH STENT PLACEMENT  07/17/2014   a. DES to 80% mid LAD   HERNIA REPAIR     LAPAROSCOPIC INCISIONAL / UMBILICAL / VENTRAL HERNIA REPAIR  1990's   it was a double; not inguinal   LEFT HEART CATHETERIZATION WITH CORONARY ANGIOGRAM N/A 07/17/2014   Procedure: LEFT HEART CATHETERIZATION WITH CORONARY ANGIOGRAM;  Surgeon: Maude JAYSON Emmer, MD;  Location: [facility] CATH LAB;  Service: Cardiovascular;  Laterality: N/A;   PERCUTANEOUS STENT INTERVENTION  07/17/2014   Procedure: PERCUTANEOUS STENT INTERVENTION;  Surgeon: Maude JAYSON Emmer, MD;  Location: [facility] CATH LAB;  Service: Cardiovascular;;   TONSILLECTOMY AND ADENOIDECTOMY  1990's   TRIGGER FINGER RELEASE Left 1990's   2nd & 5th digits   UMBILICAL HERNIA REPAIR  1980's   w/mesh   UVULOPALATOPHARYNGOPLASTY (UPPP)/TONSILLECTOMY/SEPTOPLASTY  1990's   Family History  Problem Relation Age of Onset   Osteoporosis Mother    Diabetes Mother    Heart disease Father    Heart attack Father    Diabetes Father    COPD Father    Heart attack Brother    Stroke Maternal Grandmother    Stroke Maternal Grandfather    Stroke Paternal Grandmother    Stroke Paternal Grandfather    Social History   Socioeconomic History   Marital status: Single     Spouse name: Not on file   Number of children: Not on file   Years of education: 13   Highest education level: Not on file  Occupational History   Occupation: [employer]   Occupation: Naval architect (retired)  Tobacco Use   Smoking status: Former    Current packs/day: 0.00    Average packs/day: 1 pack/day for 46.0 years (46.0  ttl pk-yrs)    Types: Cigarettes    Start date: 12/28/1975    Quit date: 12/27/2021    Years since quitting: 2.2   Smokeless tobacco: Never  Vaping Use   Vaping status: Never Used  Substance and Sexual Activity   Alcohol use: Not Currently    Alcohol/week: 1.0 standard drink of alcohol    Types: 1 Glasses of wine per week    Comment: 07/17/2014 glass of wine once or twice/month   Drug use: No   Sexual activity: Yes    Partners: Male    Comment: declined condoms  Other Topics Concern   Not on file  Social History Narrative   [place] approved beginning 12/11/2009 - 12/27/2010   [employer] benefits approved; patient eligible for 100% discount for out patient labs and office visits. Patient eligible for 70% discount for other services per Heron Goodell 09/08/2010      Patient is on disability, and walks 2-3 times per week.    Social Drivers of Corporate investment banker Strain: Low Risk  (04/05/2024)   Overall Financial Resource Strain (CARDIA)    Difficulty of Paying Living Expenses: Not hard at all  Food Insecurity: No Food Insecurity (04/05/2024)   Hunger Vital Sign    Worried About Running Out of Food in the Last Year: Never true    Ran Out of Food in the Last Year: Never true  Transportation Needs: No Transportation Needs (04/05/2024)   PRAPARE - Administrator, Civil Service (Medical): No    Lack of Transportation (Non-Medical): No  Physical Activity: Sufficiently Active (04/05/2024)   Exercise Vital Sign    Days of Exercise per Week: 5 days    Minutes of Exercise per Session: 30 min  Stress: No Stress Concern Present (04/05/2024)   [employer]  of Occupational Health - Occupational Stress Questionnaire    Feeling of Stress: Not at all  Social Connections: Socially Isolated (04/05/2024)   Social Connection and Isolation Panel    Frequency of Communication with Friends and Family: More than three times a week    Frequency of Social Gatherings with Friends and Family: More than three times a week    Attends Religious Services: Never    Database administrator or Organizations: No    Attends Engineer, structural: Never    Marital Status: Never married    Tobacco Counseling Counseling given: Not Answered    Clinical Intake:  Pre-visit preparation completed: Yes  Pain : 0-10 Pain Score: 5  Pain Type: Chronic pain Pain Location: Generalized     BMI - recorded: 31.14 Nutritional Status: BMI > 30  Obese Nutritional Risks: None Diabetes: Yes CBG done?: No CBG resulted in Enter/ Edit results?: No Did pt. bring in CBG monitor from home?: No Glucose Meter Downloaded?: No  Lab Results  Component Value Date   HGBA1C >14.0 03/07/2024   HGBA1C 10.1 (A) 11/22/2023   HGBA1C 9.5 (A) 08/05/2023     How often do you need to have someone help you when you read instructions, pamphlets, or other written materials from your doctor or pharmacy?: 1 - Never What is the last grade level you completed in school?: HSG  Interpreter Needed?: No  Information entered by :: Roz Fuller, LPN.   Activities of Daily Living     04/05/2024   10:40 AM 08/05/2023    8:44 AM  In your present state of health, do you have any difficulty performing the following  activities:  Hearing? 0 0  Vision? 0 0  Difficulty concentrating or making decisions? 0 0  Walking or climbing stairs? 0 0  Dressing or bathing? 0 0  Doing errands, shopping? 0 0  Preparing Food and eating ? N   Using the Toilet? N   In the past six months, have you accidently leaked urine? N   Do you have problems with loss of bowel control? N   Managing your  Medications? N   Managing your Finances? N   Housekeeping or managing your Housekeeping? N     Patient Care Team: Rosan Dayton BROCKS, DO as PCP - General (Internal Medicine) Eben Reyes BROCKS, MD as PCP - Infectious Diseases (Infectious Diseases) Delford Maude BROCKS, MD as PCP - Cardiology (Cardiology) Portia Fireman, OD as Consulting Physician (Optometry) Calla Dunnings, OD as Referring Physician (Optometry) Tobie Baptist, MD as Consulting Physician (Ophthalmology) Ablott, Roselie as Consulting Physician (Optometry)  I have updated your Care Teams any recent Medical Services you may have received from other providers in the past year.     Assessment:   This is a routine wellness examination for [place].  Hearing/Vision screen Hearing Screening - Comments:: Adequate hearing, no hearing aids. Vision Screening - Comments:: Wears rx glasses - up to date with routine eye exams with Dr. Roselie Quay and Dr. Jeffie Tobie (Retina Specialist)    Goals Addressed             This Visit's Progress    04/05/2024: Maintain my health and stay independent.       HEMOGLOBIN A1C < 7.0       LDL CALC < 100       Quit smoking / using tobacco         Depression Screen     04/05/2024   10:37 AM 03/07/2024    8:51 AM 11/22/2023    8:46 AM 08/05/2023    8:54 AM 06/29/2023    8:57 AM 03/24/2023    8:28 AM 03/11/2023   10:52 AM  PHQ 2/9 Scores  PHQ - 2 Score 0 0 1 0 1 0 0  PHQ- 9 Score 1          Fall Risk     04/05/2024   10:37 AM 03/07/2024    8:50 AM 11/22/2023    8:47 AM 08/05/2023    8:52 AM 06/29/2023    8:56 AM  Fall Risk   Falls in the past year? 1 1 0 1 0  Number falls in past yr: 1 1 0 1   Injury with Fall? 1 0 0 0   Risk for fall due to : History of fall(s);Impaired balance/gait;Orthopedic patient Impaired balance/gait No Fall Risks Impaired mobility No Fall Risks  Follow up Falls evaluation completed;Education provided  Falls prevention discussed Falls evaluation completed  Falls evaluation completed    MEDICARE RISK AT HOME:  Medicare Risk at Home Any stairs in or around the home?: Yes (Basement, Outside) If so, are there any without handrails?: No Home free of loose throw rugs in walkways, pet beds, electrical cords, etc?: Yes Adequate lighting in your home to reduce risk of falls?: Yes Life alert?: No Use of a cane, walker or w/c?: Yes (Cane) Grab bars in the bathroom?: No Shower chair or bench in shower?: No Elevated toilet seat or a handicapped toilet?: Yes  TIMED UP AND GO:  Was the test performed?  No  Cognitive Function: 6CIT completed    04/05/2024   10:41  AM  MMSE - Mini Mental State Exam  Not completed: Unable to complete        04/05/2024   10:41 AM 03/24/2023    8:31 AM 01/30/2022   11:19 AM  6CIT Screen  What Year? 0 points 0 points 0 points  What month? 0 points 0 points 0 points  What time? 0 points 0 points 0 points  Count back from 20 0 points 0 points 0 points  Months in reverse 0 points 0 points 0 points  Repeat phrase 0 points 0 points 0 points  Total Score 0 points 0 points 0 points    Immunizations Immunization History  Administered Date(s) Administered   Fluad Quad(high Dose 65+) 06/09/2019, 05/27/2021, 05/15/2023   Hepatitis A 12/12/2007, 12/19/2008   Influenza Split 06/09/2012, 05/31/2014, 05/27/2021   Influenza Whole 06/07/2006, 07/19/2007, 06/27/2008, 07/24/2009, 06/26/2010, 06/09/2011   Influenza, High Dose Seasonal PF 05/10/2020, 05/05/2022   Influenza,inj,Quad PF,6+ Mos 05/22/2013, 04/29/2016, 05/06/2017, 07/04/2018   Influenza,inj,quad, With Preservative 05/07/2015   Influenza-Unspecified 05/30/2014, 05/07/2015, 11/13/2015   MMR 01/09/2024   PFIZER Comirnaty(Gray Top)Covid-19 Tri-Sucrose Vaccine 01/28/2021   PFIZER(Purple Top)SARS-COV-2 Vaccination 10/21/2019, 11/11/2019, 05/24/2020, 07/05/2021   Pfizer Covid-19 Vaccine Bivalent Booster 65yrs & up 07/05/2021   Pneumococcal Conjugate-13 02/25/2016    Pneumococcal Polysaccharide-23 06/07/2006, 06/29/2011, 08/08/2019   Respiratory Syncytial Virus Vaccine,Recomb Aduvanted(Arexvy) 07/23/2022   Td 12/04/2009   Tdap 06/14/2020   Zoster Recombinant(Shingrix) 05/05/2019, 12/15/2019   Zoster, Live 07/06/2014    Screening Tests Health Maintenance  Topic Date Due   Hepatitis B Vaccines (1 of 3 - Risk 3-dose series) Never done   COVID-19 Vaccine (6 - 2024-25 season) 05/30/2023   OPHTHALMOLOGY EXAM  09/03/2023   INFLUENZA VACCINE  04/28/2024   HEMOGLOBIN A1C  06/07/2024   Lung Cancer Screening  08/09/2024   FOOT EXAM  03/07/2025   LIPID PANEL  03/13/2025   Diabetic kidney evaluation - eGFR measurement  03/27/2025   Diabetic kidney evaluation - Urine ACR  03/27/2025   Medicare Annual Wellness (AWV)  04/05/2025   Colonoscopy  11/17/2027   DTaP/Tdap/Td (3 - Td or Tdap) 06/14/2030   Pneumococcal Vaccine: 50+ Years  Completed   Hepatitis C Screening  Completed   Zoster Vaccines- Shingrix  Completed   HPV VACCINES  Aged Out   Meningococcal B Vaccine  Aged Out   COLON CANCER SCREENING ANNUAL FOBT  Discontinued    Health Maintenance  Health Maintenance Due  Topic Date Due   Hepatitis B Vaccines (1 of 3 - Risk 3-dose series) Never done   COVID-19 Vaccine (6 - 2024-25 season) 05/30/2023   OPHTHALMOLOGY EXAM  09/03/2023   Health Maintenance Items Addressed: Yes Patient aware of current care gaps.  Immunization record was verified by [facility] and updated in patient's chart.  Additional Screening:  Vision Screening: Recommended annual ophthalmology exams for early detection of glaucoma and other disorders of the eye. Would you like a referral to an eye doctor? No    Dental Screening: Recommended annual dental exams for proper oral hygiene  Community Resource Referral / Chronic Care Management: CRR required this visit?  No   CCM required this visit?  No   Plan:    I have personally reviewed and noted the following in the patient's  chart:   Medical and social history Use of alcohol, tobacco or illicit drugs  Current medications and supplements including opioid prescriptions. Patient is currently taking opioid prescriptions. Information provided to patient regarding non-opioid alternatives. Patient advised to discuss non-opioid  treatment plan with their provider. Functional ability and status Nutritional status Physical activity Advanced directives List of other physicians Hospitalizations, surgeries, and ER visits in previous 12 months Vitals Screenings to include cognitive, depression, and falls Referrals and appointments  In addition, I have reviewed and discussed with patient certain preventive protocols, quality metrics, and best practice recommendations. A written personalized care plan for preventive services as well as general preventive health recommendations were provided to patient.   Roz LOISE Fuller, LPN   2/0/7974   After Visit Summary: (MyChart) Due to this being a telephonic visit, the after visit summary with patients personalized plan was offered to patient via MyChart   Notes: Patient aware of current care gaps.  Immunization record was verified by [facility] and updated in patient's chart.

## 2024-04-05 NOTE — Patient Instructions (Signed)
 Mr. Popelka , Thank you for taking time out of your busy schedule to complete your Annual Wellness Visit with me. I enjoyed our conversation and look forward to speaking with you again next year. I, as well as your care team,  appreciate your ongoing commitment to your health goals. Please review the following plan we discussed and let me know if I can assist you in the future. Your Game plan/ To Do List    Referrals: If you haven't heard from the office you've been referred to, please reach out to them at the phone provided.   Follow up Visits: Next Medicare AWV with our clinical staff: 04/11/2025 at 10:30 a.m. phone visit with Nurse Roz   Have you seen your provider in the last 6 months (3 months if uncontrolled diabetes)? Yes Next Office Visit with your provider: Office will call to schedule for next appointment  Clinician Recommendations:  Aim for 30 minutes of exercise or brisk walking, 6-8 glasses of water, and 5 servings of fruits and vegetables each day.       This is a list of the screening recommended for you and due dates:  Health Maintenance  Topic Date Due   Hepatitis B Vaccine (1 of 3 - Risk 3-dose series) Never done   COVID-19 Vaccine (6 - 2024-25 season) 05/30/2023   Eye exam for diabetics  09/03/2023   Flu Shot  04/28/2024   Hemoglobin A1C  06/07/2024   Screening for Lung Cancer  08/09/2024   Complete foot exam   03/07/2025   Lipid (cholesterol) test  03/13/2025   Yearly kidney function blood test for diabetes  03/27/2025   Yearly kidney health urinalysis for diabetes  03/27/2025   Medicare Annual Wellness Visit  04/05/2025   Colon Cancer Screening  11/17/2027   DTaP/Tdap/Td vaccine (3 - Td or Tdap) 06/14/2030   Pneumococcal Vaccine for age over 37  Completed   Hepatitis C Screening  Completed   Zoster (Shingles) Vaccine  Completed   HPV Vaccine  Aged Out   Meningitis B Vaccine  Aged Out   Stool Blood Test  Discontinued    Advanced directives: (Copy Requested)  Please bring a copy of your health care power of attorney and living will to the office to be added to your chart at your convenience. You can mail to [facility] [address] [place], [place] [address] or email to [email or web address] Advance Care Planning is important because it:  [x]  Makes sure you receive the medical care that is consistent with your values, goals, and preferences  [x]  It provides guidance to your family and loved ones and reduces their decisional burden about whether or not they are making the right decisions based on your wishes.  Follow the link provided in your after visit summary or read over the paperwork we have mailed to you to help you started getting your Advance Directives in place. If you need assistance in completing these, please reach out to us  so that we can help you!  See attachments for Preventive Care and Fall Prevention Tips.

## 2024-04-06 ENCOUNTER — Encounter: Admitting: Internal Medicine

## 2024-04-09 ENCOUNTER — Other Ambulatory Visit: Payer: Self-pay | Admitting: Internal Medicine

## 2024-04-09 DIAGNOSIS — B20 Human immunodeficiency virus [HIV] disease: Secondary | ICD-10-CM

## 2024-04-17 DIAGNOSIS — H59812 Chorioretinal scars after surgery for detachment, left eye: Secondary | ICD-10-CM | POA: Diagnosis not present

## 2024-04-17 DIAGNOSIS — H43812 Vitreous degeneration, left eye: Secondary | ICD-10-CM | POA: Diagnosis not present

## 2024-04-17 DIAGNOSIS — E113292 Type 2 diabetes mellitus with mild nonproliferative diabetic retinopathy without macular edema, left eye: Secondary | ICD-10-CM | POA: Diagnosis not present

## 2024-04-17 DIAGNOSIS — H35363 Drusen (degenerative) of macula, bilateral: Secondary | ICD-10-CM | POA: Diagnosis not present

## 2024-04-17 DIAGNOSIS — H2513 Age-related nuclear cataract, bilateral: Secondary | ICD-10-CM | POA: Diagnosis not present

## 2024-04-17 DIAGNOSIS — E113311 Type 2 diabetes mellitus with moderate nonproliferative diabetic retinopathy with macular edema, right eye: Secondary | ICD-10-CM | POA: Diagnosis not present

## 2024-04-17 DIAGNOSIS — H353121 Nonexudative age-related macular degeneration, left eye, early dry stage: Secondary | ICD-10-CM | POA: Diagnosis not present

## 2024-04-18 ENCOUNTER — Other Ambulatory Visit: Payer: Self-pay

## 2024-04-18 DIAGNOSIS — N401 Enlarged prostate with lower urinary tract symptoms: Secondary | ICD-10-CM

## 2024-04-18 MED ORDER — TADALAFIL 5 MG PO TABS: 5.0000 mg | ORAL_TABLET | Freq: Every day | ORAL | 1 refills | Status: AC

## 2024-04-18 NOTE — Telephone Encounter (Signed)
 Medication sent to pharmacy

## 2024-04-21 DIAGNOSIS — H02056 Trichiasis without entropian left eye, unspecified eyelid: Secondary | ICD-10-CM | POA: Diagnosis not present

## 2024-04-21 DIAGNOSIS — H524 Presbyopia: Secondary | ICD-10-CM | POA: Diagnosis not present

## 2024-04-21 DIAGNOSIS — H04123 Dry eye syndrome of bilateral lacrimal glands: Secondary | ICD-10-CM | POA: Diagnosis not present

## 2024-04-21 DIAGNOSIS — H5213 Myopia, bilateral: Secondary | ICD-10-CM | POA: Diagnosis not present

## 2024-04-21 DIAGNOSIS — E119 Type 2 diabetes mellitus without complications: Secondary | ICD-10-CM | POA: Diagnosis not present

## 2024-04-21 DIAGNOSIS — H2513 Age-related nuclear cataract, bilateral: Secondary | ICD-10-CM | POA: Diagnosis not present

## 2024-04-21 DIAGNOSIS — H52223 Regular astigmatism, bilateral: Secondary | ICD-10-CM | POA: Diagnosis not present

## 2024-04-21 DIAGNOSIS — H33322 Round hole, left eye: Secondary | ICD-10-CM | POA: Diagnosis not present

## 2024-04-24 ENCOUNTER — Other Ambulatory Visit: Payer: Self-pay

## 2024-04-25 MED ORDER — TAMSULOSIN HCL 0.4 MG PO CAPS: 0.4000 mg | ORAL_CAPSULE | Freq: Every day | ORAL | 1 refills | Status: AC

## 2024-05-15 ENCOUNTER — Other Ambulatory Visit: Payer: Self-pay

## 2024-05-15 DIAGNOSIS — E11618 Type 2 diabetes mellitus with other diabetic arthropathy: Secondary | ICD-10-CM

## 2024-05-16 MED ORDER — LANTUS SOLOSTAR 100 UNIT/ML ~~LOC~~ SOPN: 80.0000 [IU] | PEN_INJECTOR | Freq: Every day | SUBCUTANEOUS | 11 refills | Status: DC

## 2024-05-16 MED ORDER — FIASP FLEXTOUCH 100 UNIT/ML ~~LOC~~ SOPN: 14.0000 [IU] | PEN_INJECTOR | Freq: Three times a day (TID) | SUBCUTANEOUS | 11 refills | Status: DC

## 2024-05-30 ENCOUNTER — Other Ambulatory Visit: Payer: Self-pay | Admitting: *Deleted

## 2024-05-30 DIAGNOSIS — E7800 Pure hypercholesterolemia, unspecified: Secondary | ICD-10-CM

## 2024-05-30 MED ORDER — ATORVASTATIN CALCIUM 40 MG PO TABS: 40.0000 mg | ORAL_TABLET | Freq: Every day | ORAL | 3 refills | Status: AC

## 2024-05-31 DIAGNOSIS — E1165 Type 2 diabetes mellitus with hyperglycemia: Secondary | ICD-10-CM | POA: Diagnosis not present

## 2024-06-12 ENCOUNTER — Other Ambulatory Visit: Payer: Self-pay | Admitting: *Deleted

## 2024-06-12 DIAGNOSIS — J42 Unspecified chronic bronchitis: Secondary | ICD-10-CM

## 2024-06-12 MED ORDER — ALBUTEROL SULFATE HFA 108 (90 BASE) MCG/ACT IN AERS: 2.0000 | INHALATION_SPRAY | Freq: Four times a day (QID) | RESPIRATORY_TRACT | 3 refills | Status: DC | PRN

## 2024-07-05 ENCOUNTER — Ambulatory Visit: Payer: Self-pay

## 2024-07-05 ENCOUNTER — Telehealth: Payer: Self-pay | Admitting: Internal Medicine

## 2024-07-05 DIAGNOSIS — Z79891 Long term (current) use of opiate analgesic: Secondary | ICD-10-CM

## 2024-07-05 MED ORDER — HYDROCODONE-ACETAMINOPHEN 7.5-325 MG PO TABS: 1.0000 | ORAL_TABLET | Freq: Two times a day (BID) | ORAL | 0 refills | Status: DC | PRN

## 2024-07-05 NOTE — Telephone Encounter (Signed)
 [id number] has scheduled pt an appt 10/13 with Dr Harrie.

## 2024-07-05 NOTE — Telephone Encounter (Signed)
 FYI Only or Action Required?: FYI only for provider.  Patient was last seen in primary care on 03/27/2024 by Rosan Dayton BROCKS, DO.  Called Nurse Triage reporting Foot Swelling.  Symptoms began several weeks ago.  Interventions attempted: Nothing.  Symptoms are: stable.  Triage Disposition: See PCP Within 2 Weeks  Patient/caregiver understands and will follow disposition?: Yes Reason for Disposition  [1] MILD swelling of both ankles (i.e., pedal edema) AND [2] is a chronic symptom (recurrent or ongoing AND present > 4 weeks)  Answer Assessment - Initial Assessment Questions States the right foot feels like he's walking on marshmallows. Has neuropathy, has appointment with rheumatology in December. Most recent BP reading 151/76 today. Compliant with taking BP medication every morning.  1. LOCATION: Which ankle is swollen? Where is the swelling?     Both ankles and feet, right is worse  2. ONSET: When did the swelling start?     Weeks  3. SWELLING: How bad is the swelling? Or, How large is it? (e.g., mild, moderate, severe; size of localized swelling)      Mild to moderate, when its at its worst, couldn't tell he had ankles  4. PAIN: Is there any pain? If Yes, ask: How bad is it? (Scale 0-10; or none, mild, moderate, severe)     Pain in feet at night time, 7 or 8 /10   5. CAUSE: What do you think caused the ankle swelling?     Unsure, has neuropathy, and high blood pressure  6. OTHER SYMPTOMS: Do you have any other symptoms? (e.g., fever, chest pain, difficulty breathing, calf pain)     Chest pain, takes pepto bismol, states its indigestion and has had it for a while.  Protocols used: Ankle Swelling-A-AH  Copied from CRM [id number]. Topic: Clinical - Red Word Triage >> Jul 05, 2024 10:54 AM Alfonso ORN wrote: Red Word that prompted transfer to Nurse Triage:  swelling in both  feet and ankle and right foot is worse especially when walking  and BP reading 151/76

## 2024-07-05 NOTE — Telephone Encounter (Signed)
 Called back no SOB, just the swelling, already given an appointment with out clinic but I will move that over to my schedule.  Refilled hydrocodone  Rx.

## 2024-07-10 ENCOUNTER — Ambulatory Visit (INDEPENDENT_AMBULATORY_CARE_PROVIDER_SITE_OTHER): Payer: Self-pay | Admitting: Internal Medicine

## 2024-07-10 ENCOUNTER — Encounter: Payer: Self-pay | Admitting: Internal Medicine

## 2024-07-10 VITALS — BP 163/82 | HR 66 | Temp 98.0°F | Ht 70.0 in | Wt 221.6 lb

## 2024-07-10 DIAGNOSIS — Z7985 Long-term (current) use of injectable non-insulin antidiabetic drugs: Secondary | ICD-10-CM

## 2024-07-10 DIAGNOSIS — Z87891 Personal history of nicotine dependence: Secondary | ICD-10-CM

## 2024-07-10 DIAGNOSIS — Z794 Long term (current) use of insulin: Secondary | ICD-10-CM | POA: Diagnosis not present

## 2024-07-10 DIAGNOSIS — Z8249 Family history of ischemic heart disease and other diseases of the circulatory system: Secondary | ICD-10-CM | POA: Diagnosis not present

## 2024-07-10 DIAGNOSIS — E1142 Type 2 diabetes mellitus with diabetic polyneuropathy: Secondary | ICD-10-CM | POA: Diagnosis not present

## 2024-07-10 DIAGNOSIS — I872 Venous insufficiency (chronic) (peripheral): Secondary | ICD-10-CM

## 2024-07-10 DIAGNOSIS — Z79899 Other long term (current) drug therapy: Secondary | ICD-10-CM

## 2024-07-10 DIAGNOSIS — Z833 Family history of diabetes mellitus: Secondary | ICD-10-CM

## 2024-07-10 DIAGNOSIS — I10 Essential (primary) hypertension: Secondary | ICD-10-CM

## 2024-07-10 DIAGNOSIS — E11618 Type 2 diabetes mellitus with other diabetic arthropathy: Secondary | ICD-10-CM | POA: Diagnosis not present

## 2024-07-10 LAB — GLUCOSE, CAPILLARY: Glucose-Capillary: 135 mg/dL — ABNORMAL HIGH (ref 70–99)

## 2024-07-10 LAB — POCT GLYCOSYLATED HEMOGLOBIN (HGB A1C): HbA1c, POC (controlled diabetic range): 9.4 % — AB (ref 0.0–7.0)

## 2024-07-10 MED ORDER — TORSEMIDE 20 MG PO TABS: 20.0000 mg | ORAL_TABLET | Freq: Every day | ORAL | 2 refills | Status: DC | PRN

## 2024-07-10 MED ORDER — MOUNJARO 7.5 MG/0.5ML ~~LOC~~ SOAJ: 7.5000 mg | SUBCUTANEOUS | 5 refills | Status: AC

## 2024-07-10 MED ORDER — TELMISARTAN 80 MG PO TABS: 80.0000 mg | ORAL_TABLET | Freq: Every day | ORAL | 3 refills | Status: DC

## 2024-07-12 DIAGNOSIS — E291 Testicular hypofunction: Secondary | ICD-10-CM | POA: Diagnosis not present

## 2024-07-13 NOTE — Assessment & Plan Note (Signed)
 Hypertension management requires adjustment due to suboptimal control and contribution to lower extremity edema. - Discontinue losartan  hydrochlorothiazide . - Initiate telmisartan daily. - Monitor blood pressure and adjust torsemide use as needed. - Reassess blood pressure control in one month.

## 2024-07-13 NOTE — Assessment & Plan Note (Signed)
 Edema improving with topical cream. Exacerbated by hypertension and possibly hydrochlorothiazide . - Discontinue losartan  hydrochlorothiazide . - Initiate telmisartan daily for blood pressure control. - Prescribe torsemide 20 mg as needed for edema management. - Monitor torsemide use for frequency and effectiveness. - Reassess in one month for regimen effectiveness.

## 2024-07-13 NOTE — Assessment & Plan Note (Signed)
 Diabetes management shows improvement with A1c reduction from >14 to 9.4. Plan to increase Mounjaro  dose for further glycemic control. - Continue Synjardy  twice daily. - Continue Fiasp  18 units with meals. - Continue Lantus  80 units daily. - Increase Mounjaro  to 7.5 mg once weekly. - Monitor blood glucose levels and A1c.

## 2024-07-13 NOTE — Progress Notes (Signed)
 Subjective:  HPI: Chief Complaint  Patient presents with   Foot Swelling   Diabetes    Discussed the use of AI scribe software for clinical note transcription with the patient, who gave verbal consent to proceed.  History of Present Illness Riley Jones is a 68 year old male with hypertension and diabetes who presents with leg swelling.  He has been experiencing significant leg swelling. The swelling was severe enough to obscure his ankles and affect his toes. He reports that the swelling gets very bad and then starts to clear up again.  He is currently using losartan  hydrochlorothiazide  and carvedilol  for blood pressure management, and Synjardy  twice a day for diabetes. His diabetes management also includes insulin  (18 units with meals), Lantus  (80 units), and Mounjaro  (5 mg weekly on Wednesdays). His recent A1c is 9.4, down from over 14, and he aims to improve his diabetes control further, targeting an A1c of 7.  There is a family history of leg swelling and varicose veins, as his mother experienced similar symptoms and required leg wrapping due to weeping. He is concerned about following a similar trajectory and notes that his mother had a history of pain and was reluctant to take pain medication due to past addiction.  He is trying to stay more active, mentioning setting up a rowing machine in his basement and reapplying for work. He reports recent weight gain around his belly despite not eating as much as before. [facility] and inquires about the need for a pneumonia shot.  He reports frequent urination but not in large volumes.     Please see Assessment and Plan below for the status of his chronic medical problems.  Objective:  Physical Exam: Vitals:   07/10/24 0928 07/10/24 1016  BP: (!) 160/82 (!) 163/82  Pulse: 68 66  Temp: 98 F (36.7 C)   TempSrc: Oral   SpO2: 98%   Weight: 221 lb 9.6 oz (100.5 kg)   Height: 5' 10 (1.778 m)    Body mass  index is 31.8 kg/m. Physical Exam Constitutional:      Appearance: Normal appearance.  Cardiovascular:     Rate and Rhythm: Normal rate and regular rhythm.     Pulses:          Dorsalis pedis pulses are 2+ on the right side and 2+ on the left side.       Posterior tibial pulses are 2+ on the right side and 2+ on the left side.  Pulmonary:     Effort: Pulmonary effort is normal.  Musculoskeletal:     Right lower leg: Edema present.     Left lower leg: Edema present.  Neurological:     Mental Status: He is alert.  Psychiatric:        Mood and Affect: Mood normal.    Results LABS   Hemoglobin A1c: 9.4% Results for orders placed or performed in visit on 07/10/24  Glucose, capillary  Result Value Ref Range   Glucose-Capillary 135 (H) 70 - 99 mg/dL  POC Hbg J8R  Result Value Ref Range   Hemoglobin A1C     HbA1c POC (<> result, manual entry)     HbA1c, POC (prediabetic range)     HbA1c, POC (controlled diabetic range) 9.4 (A) 0.0 - 7.0 %    The 10-year ASCVD risk score (Arnett DK, et al., 2019) is: 50.8%  Assessment & Plan:  See Encounters Tab for problem based charting.  Assessment & Plan Type 2 diabetes mellitus with other diabetic arthropathy, with long-term current use of insulin  (HCC)  Orders:   POC Hbg A1C  Venous reflux Edema improving with topical cream. Exacerbated by hypertension and possibly hydrochlorothiazide . - Discontinue losartan  hydrochlorothiazide . - Initiate telmisartan daily for blood pressure control. - Prescribe torsemide 20 mg as needed for edema management. - Monitor torsemide use for frequency and effectiveness. - Reassess in one month for regimen effectiveness.    Essential hypertension Hypertension management requires adjustment due to suboptimal control and contribution to lower extremity edema. - Discontinue losartan  hydrochlorothiazide . - Initiate telmisartan daily. - Monitor blood pressure and adjust torsemide use as needed. -  Reassess blood pressure control in one month.    Type 2 diabetes mellitus with peripheral neuropathy (HCC) Diabetes management shows improvement with A1c reduction from >14 to 9.4. Plan to increase Mounjaro  dose for further glycemic control. - Continue Synjardy  twice daily. - Continue Fiasp  18 units with meals. - Continue Lantus  80 units daily. - Increase Mounjaro  to 7.5 mg once weekly. - Monitor blood glucose levels and A1c.      Medications Ordered Meds ordered this encounter  Medications   telmisartan (MICARDIS) 80 MG tablet    Sig: Take 1 tablet (80 mg total) by mouth daily.    Dispense:  90 tablet    Refill:  3    Discontinue losartan -HCTZ   torsemide (DEMADEX) 20 MG tablet    Sig: Take 1 tablet (20 mg total) by mouth daily as needed (leg swelling).    Dispense:  30 tablet    Refill:  2   tirzepatide  (MOUNJARO ) 7.5 MG/0.5ML Pen    Sig: Inject 7.5 mg into the skin once a week.    Dispense:  2 mL    Refill:  5   Other Orders Orders Placed This Encounter  Procedures   Glucose, capillary   POC Hbg A1C   Follow Up: Return in about 4 weeks (around 08/07/2024) for HTN.

## 2024-07-19 DIAGNOSIS — N401 Enlarged prostate with lower urinary tract symptoms: Secondary | ICD-10-CM | POA: Diagnosis not present

## 2024-07-19 DIAGNOSIS — N5201 Erectile dysfunction due to arterial insufficiency: Secondary | ICD-10-CM | POA: Diagnosis not present

## 2024-07-19 DIAGNOSIS — E291 Testicular hypofunction: Secondary | ICD-10-CM | POA: Diagnosis not present

## 2024-07-19 DIAGNOSIS — R399 Unspecified symptoms and signs involving the genitourinary system: Secondary | ICD-10-CM | POA: Diagnosis not present

## 2024-07-24 ENCOUNTER — Telehealth: Payer: Self-pay

## 2024-07-24 NOTE — Telephone Encounter (Signed)
 Dear Riley Jones, We received a request from your doctor for a  prior authorization on INSULIN  GLARGINE  SOLOSTAR U100. Based on the information  received, we are unable to approve the request to have this drug covered under your Part D  benefit. The drug you asked for is not listed in your  preferred drug list (formulary). The preferred  drug(s), you may not have tried, are: Toujeo   SoloStar U-300 Insulin  subcutaneous pen OR  Toujeo  Max U-300 SoloStar subcutaneous  insulin  pen. Your provider needs to give us   medical reasons why the preferred drug(s)  would not work for you and/or would have bad  side effects. Sometimes a preferred drug needs  more review for approval. Additionally, some  preferred drugs listed may be the same drugs  with different strengths or forms. [employer] may  only require one strength or form of that drug to  be tried. This decision was from [employer]'s NonFormulary Exceptions Coverage Policy at  [email or web address].

## 2024-07-24 NOTE — Telephone Encounter (Signed)
 Prior Authorization for patient (Insulin  Glargine Solostar 100UNIT/ML pen-injectors) came through on cover my meds was submitted with last office notes and labs awaiting approval or denial.  KEY:[id number]

## 2024-07-27 MED ORDER — TOUJEO MAX SOLOSTAR 300 UNIT/ML ~~LOC~~ SOPN: 80.0000 [IU] | PEN_INJECTOR | Freq: Every day | SUBCUTANEOUS | 5 refills | Status: AC

## 2024-07-27 MED ORDER — TOUJEO MAX SOLOSTAR 300 UNIT/ML ~~LOC~~ SOPN: 80.0000 [IU] | PEN_INJECTOR | Freq: Every day | SUBCUTANEOUS | 5 refills | Status: DC

## 2024-07-27 NOTE — Addendum Note (Signed)
 Addended by: ROSAN PRIDE C on: 07/27/2024 02:51 PM   Modules accepted: Orders

## 2024-07-27 NOTE — Telephone Encounter (Addendum)
 I called and spoke to the patient he is aware that a new rx for Toujeo  max has been sent to the pharmacy. Patient is requesting the [facility] [place] [facility] of [facility].  [id number] - [place], [place] - [address]

## 2024-07-27 NOTE — Telephone Encounter (Signed)
 Updated to [facility] [place]

## 2024-07-27 NOTE — Addendum Note (Signed)
 Addended by: ROSAN PRIDE C on: 07/27/2024 03:11 PM   Modules accepted: Orders

## 2024-07-27 NOTE — Telephone Encounter (Signed)
 Prior auth denial noted.  Will change over to Toujeo  Max, continue at 80 units daily.  Sent new rx.  Jada could you relay this to [place]?

## 2024-08-09 ENCOUNTER — Ambulatory Visit

## 2024-08-09 ENCOUNTER — Encounter: Payer: Self-pay | Admitting: Internal Medicine

## 2024-08-09 ENCOUNTER — Ambulatory Visit: Attending: Internal Medicine | Admitting: Internal Medicine

## 2024-08-09 VITALS — BP 147/73 | HR 78 | Temp 98.4°F | Resp 16 | Ht 70.0 in | Wt 219.6 lb

## 2024-08-09 DIAGNOSIS — L409 Psoriasis, unspecified: Secondary | ICD-10-CM | POA: Diagnosis not present

## 2024-08-09 DIAGNOSIS — Z79899 Other long term (current) drug therapy: Secondary | ICD-10-CM | POA: Diagnosis not present

## 2024-08-09 DIAGNOSIS — E1142 Type 2 diabetes mellitus with diabetic polyneuropathy: Secondary | ICD-10-CM

## 2024-08-09 DIAGNOSIS — M05741 Rheumatoid arthritis with rheumatoid factor of right hand without organ or systems involvement: Secondary | ICD-10-CM | POA: Diagnosis not present

## 2024-08-09 DIAGNOSIS — M05742 Rheumatoid arthritis with rheumatoid factor of left hand without organ or systems involvement: Secondary | ICD-10-CM | POA: Diagnosis not present

## 2024-08-09 DIAGNOSIS — I2510 Atherosclerotic heart disease of native coronary artery without angina pectoris: Secondary | ICD-10-CM | POA: Diagnosis not present

## 2024-08-09 NOTE — Progress Notes (Signed)
 Office Visit Note  Patient: Riley Jones             Date of Birth: 1956-05-28           MRN: [id number]             PCP: Rosan Dayton BROCKS, DO Referring: Rosan Dayton BROCKS, DO Visit Date: 08/09/2024 Occupation: Data Unavailable  Subjective:  New Patient (Initial Visit), Psoriasis, and Rheumatoid Arthritis   Discussed the use of AI scribe software for clinical note transcription with the patient, who gave verbal consent to proceed.  History of Present Illness   Riley Jones is a 68 year old male with psoriasis and seropositive rheumatoid arthritis who presents with joint pain and skin issues.  He experiences persistent joint pain, primarily affecting his left side, including his thumb and elbow. His left elbow was previously shattered, but it does not currently cause significant pain. He has difficulty writing due to pain in his right hand. No recent imaging of his hands or wrists has been performed. He has a history of carpal tunnel syndrome and trigger finger surgery on his right hand. Chronic back pain is present, and he describes a sensation of 'walking on marshmallows,' indicating possible neuropathy.  He has a history of psoriasis, with symptoms manifesting as skin issues on his palms, which previously split and bled. He uses a lotion that has helped prevent splitting. He has not used topical steroids for these rashes but has been using a high potency steroid, betamethasone , on his legs for a sore.  He experiences swelling in his legs, which he manages with diuretics, noting that he takes them in the morning to avoid nocturia. He wears compression socks but questions their effectiveness. He has been using ciclopirox for onychomycosis, which has helped.  He recalls being on methotrexate in the past but does not remember why it was discontinued. He has regular kidney and liver function tests due to his medication history. He does not see a rheumatologist recently but had seen [facility]  clinic in the past.  He has a history of HIV, which is well-controlled and undetectable viral load. He has not experienced any HIV-related symptoms such as sores or thrush. He takes hydrocodone  occasionally for pain, about one or two pills a week, and uses Tylenol  as needed.  DMARD Hx HCQ Leflunomide Methotrexate  Labs reviewed 02/2021 CCP >250 RF 112 ESR 4 CRP 1  2023 HAV pos  2008 HBV sAb pos HBV cAb neg  Activities of Daily Living:  Patient reports morning stiffness for 1-2 hours.   Patient Reports nocturnal pain.  Difficulty dressing/grooming: Reports Difficulty climbing stairs: Reports Difficulty getting out of chair: Reports Difficulty using hands for taps, buttons, cutlery, and/or writing: Reports  Review of Systems  Constitutional:  Positive for fatigue.  HENT:  Positive for mouth dryness. Negative for mouth sores.   Eyes:  Positive for dryness.  Respiratory:  Positive for shortness of breath.   Cardiovascular:  Positive for chest pain, palpitations and swelling in legs/feet.  Gastrointestinal:  Positive for constipation and diarrhea. Negative for blood in stool.  Endocrine: Negative for increased urination.  Genitourinary:  Negative for involuntary urination.  Musculoskeletal:  Positive for joint pain, gait problem, joint pain, joint swelling, myalgias, muscle weakness, morning stiffness and myalgias. Negative for muscle tenderness.  Skin:  Positive for rash. Negative for color change, hair loss and sensitivity to sunlight.  Allergic/Immunologic: Positive for susceptible to infections.  Neurological:  Positive for dizziness and  headaches.  Hematological:  Negative for swollen glands.  Psychiatric/Behavioral:  Positive for depressed mood and sleep disturbance. The patient is nervous/anxious.     PMFS History:  Patient Active Problem List   Diagnosis Date Noted   High risk medication use 08/09/2024   Diabetic retinopathy (HCC) 03/16/2023   Aortic  atherosclerosis 11/12/2022   Venous reflux 08/12/2022   Low platelet count 06/09/2022   BPH (benign prostatic hyperplasia) 01/20/2022   Lung nodule 10/17/2020   Psoriasis 11/02/2019   Abdominal hernia 03/22/2019   GERD (gastroesophageal reflux disease) 12/01/2016   Chronic allergic rhinitis 08/25/2016   Drug-induced skin rash 09/16/2015   Chronic prescription opiate use 11/22/2014   CAD (coronary artery disease) 09/05/2014   Preventative health care 12/15/2012   COPD (chronic obstructive pulmonary disease) (HCC) 12/04/2012   Erectile dysfunction 10/21/2011   Rheumatoid arthritis (HCC) 02/03/2011   Hypercholesterolemia 10/11/2006   Human immunodeficiency virus (HIV) disease (HCC) 07/08/2006   Type 2 diabetes mellitus with peripheral neuropathy (HCC) 07/08/2006   Carpal tunnel syndrome 07/08/2006   Essential hypertension 07/08/2006    Past Medical History:  Diagnosis Date   Allergy    Anxiety    Arthritis    pretty much all over   Asthma    mild (07/17/2014)   Atypical angina    Myoview  2003, Normal EF 64%   CAD (coronary artery disease)    a. 10/20 DES to 80% mid LAD   Candida rash of groin 11/03/2019   Cataract    COPD (chronic obstructive pulmonary disease) (HCC)    mild (07/17/2014)   Depression    Emphysema of lung (HCC)    Fatigue 06/22/2019   Foot pain 03/30/2011   GERD (gastroesophageal reflux disease)    H/O hiatal hernia    Headache    weekly (06/2014)   History of gout    HIV (human immunodeficiency virus infection) (HCC) 1995   DX after shingles followed by Dr. Epifanio (ID)   Hx of cardiovascular stress test    ETT-Myoview  (3/16):  Ex 10:15, No Ischemia, EF 57%; Normal Study   Hyperlipidemia    Hypertension    Well Controlled off meds   Increased anion gap metabolic acidosis 01/22/2021   Kidney stones    passed them all   Left lower quadrant abdominal pain 11/02/2019   Myocardial infarction [facility])    Neuropathy    Other and unspecified  angina pectoris 12/18/2008   Overview:  Overview:  Greatly appreciate PCP f/u.   Last Assessment & Plan:  - Patient with episode of CP - 4 days, constant, substernal, not immediately resolved with NTG, felt better after burping and passing gas - Repeat EKG done here with no acute ST/T wave changes - CP now completely resolved. Encouraged patient to call 911 if recurs - To f/u with cardio in the next 2 weeks for further assess   Pneumonia 4-5 times   Psoriasis 2024   Rheumatoid arthritis(714.0)    Followed by Dr. Everlean, off MTX and prdnisone since 2007   Right hip pain 04/25/2019   Right shoulder pain 01/19/2018   Shingles (herpes zoster) polyneuropathy 11/08/2018   Sleep apnea    does not wear mask (07/17/2014)   Type II diabetes mellitus (HCC)    Ulcer     Family History  Problem Relation Age of Onset   Cancer Mother    Osteoporosis Mother    Diabetes Mother    Heart disease Father    Heart attack Father  Diabetes Father    COPD Father    Brain cancer Sister    Breast cancer Sister    Obesity Sister    Cancer Brother        Prostate   Heart attack Brother    Cancer Brother        asophagas   Psoriasis Brother    Stroke Maternal Grandmother    Stroke Maternal Grandfather    Stroke Paternal Grandmother    Stroke Paternal Grandfather    Past Surgical History:  Procedure Laterality Date   APPENDECTOMY  ~ 1981   CARPAL TUNNEL RELEASE Left 1990's   CORONARY ANGIOPLASTY WITH STENT PLACEMENT  07/17/2014   a. DES to 80% mid LAD   HERNIA REPAIR     LAPAROSCOPIC INCISIONAL / UMBILICAL / VENTRAL HERNIA REPAIR  1990's   it was a double; not inguinal   LEFT HEART CATHETERIZATION WITH CORONARY ANGIOGRAM N/A 07/17/2014   Procedure: LEFT HEART CATHETERIZATION WITH CORONARY ANGIOGRAM;  Surgeon: Maude JAYSON Emmer, MD;  Location: [facility] CATH LAB;  Service: Cardiovascular;  Laterality: N/A;   PERCUTANEOUS STENT INTERVENTION  07/17/2014   Procedure: PERCUTANEOUS STENT INTERVENTION;   Surgeon: Maude JAYSON Emmer, MD;  Location: [facility] CATH LAB;  Service: Cardiovascular;;   TONSILLECTOMY AND ADENOIDECTOMY  1990's   TRIGGER FINGER RELEASE Left 1990's   2nd & 5th digits   UMBILICAL HERNIA REPAIR  1980's   w/mesh   UVULOPALATOPHARYNGOPLASTY (UPPP)/TONSILLECTOMY/SEPTOPLASTY  1990's   Social History   Tobacco Use   Smoking status: Former    Current packs/day: 0.00    Average packs/day: 1 pack/day for 46.0 years (46.0 ttl pk-yrs)    Types: Cigarettes    Start date: 12/28/1975    Quit date: 12/27/2021    Years since quitting: 2.6    Passive exposure: Past   Smokeless tobacco: Never  Vaping Use   Vaping status: Never Used  Substance Use Topics   Alcohol use: Not Currently    Alcohol/week: 1.0 standard drink of alcohol    Types: 1 Glasses of wine per week    Comment: 07/17/2014 glass of wine once or twice/month   Drug use: No   Social History   Social History Narrative   [employer] approved beginning 12/11/2009 - 12/27/2010   [employer] benefits approved; patient eligible for 100% discount for out patient labs and office visits. Patient eligible for 70% discount for other services per Heron Goodell 09/08/2010      Patient is on disability, and walks 2-3 times per week.      Immunization History  Administered Date(s) Administered   Fluad Quad(high Dose 65+) 06/09/2019, 05/27/2021, 05/15/2023   Hepatitis A 12/12/2007, 12/19/2008   INFLUENZA, HIGH DOSE SEASONAL PF 05/10/2020, 05/05/2022, 05/26/2024   Influenza Split 06/09/2012, 05/31/2014, 05/27/2021   Influenza Whole 06/07/2006, 07/19/2007, 06/27/2008, 07/24/2009, 06/26/2010, 06/09/2011   Influenza,inj,Quad PF,6+ Mos 05/22/2013, 04/29/2016, 05/06/2017, 07/04/2018   Influenza,inj,quad, With Preservative 05/07/2015   Influenza-Unspecified 05/30/2014, 05/07/2015, 11/13/2015   MMR 01/09/2024   PFIZER Comirnaty(Gray Top)Covid-19 Tri-Sucrose Vaccine 01/28/2021   PFIZER(Purple Top)SARS-COV-2 Vaccination 10/21/2019, 11/11/2019,  05/24/2020, 07/05/2021   Pfizer Covid-19 Vaccine Bivalent Booster 92yrs & up 07/05/2021   Pneumococcal Conjugate-13 02/25/2016   Pneumococcal Polysaccharide-23 06/07/2006, 06/29/2011, 08/08/2019   Respiratory Syncytial Virus Vaccine,Recomb Aduvanted(Arexvy) 07/23/2022   Td 12/04/2009   Tdap 06/14/2020   Zoster Recombinant(Shingrix) 05/05/2019, 12/15/2019   Zoster, Live 07/06/2014     Objective: Vital Signs: BP (!) 147/73   Pulse 78   Temp 98.4 F (36.9 C)  Resp 16   Ht 5' 10 (1.778 m)   Wt 219 lb 9.6 oz (99.6 kg)   BMI 31.51 kg/m    Physical Exam Eyes:     Conjunctiva/sclera: Conjunctivae normal.  Cardiovascular:     Rate and Rhythm: Normal rate and regular rhythm.  Pulmonary:     Effort: Pulmonary effort is normal.     Breath sounds: Normal breath sounds.  Lymphadenopathy:     Cervical: No cervical adenopathy.  Skin:    General: Skin is warm and dry.     Findings: Rash present.     Comments: Patch of erythematous rash with some overlying scale on left lateral ankle Right palmar rash with white/flaky overlying skin no erythema or pustules No nail pitting  Neurological:     Mental Status: He is alert.  Psychiatric:        Mood and Affect: Mood normal.      Musculoskeletal Exam:  Shoulders full ROM no tenderness or swelling Elbows full ROM, slightly limited extension ROM, tenderness to pressure no palpable swelling Wrists full ROM no tenderness or swelling Fingers full ROM, right first MCP tenderness Knees full ROM no tenderness or swelling  Investigation: No additional findings.  Imaging: No results found.  Recent Labs: Lab Results  Component Value Date   WBC 5.4 08/09/2024   HGB 14.2 08/09/2024   PLT 93 (L) 08/09/2024   NA 141 08/09/2024   K 3.9 08/09/2024   CL 102 08/09/2024   CO2 26 08/09/2024   GLUCOSE 121 (H) 08/09/2024   BUN 22 08/09/2024   CREATININE 0.85 08/09/2024   BILITOT 1.1 08/09/2024   ALKPHOS 90 01/15/2020   AST 26 08/09/2024    ALT 32 08/09/2024   PROT 7.3 08/09/2024   ALBUMIN 4.4 01/15/2020   CALCIUM  9.8 08/09/2024   GFRAA 93 02/12/2020    Speciality Comments: No specialty comments available.  Procedures:  No procedures performed Allergies: Penicillins, Zoloft  [sertraline  hcl], Victoza  [liraglutide ], Chantix  [varenicline ], Gabapentin , Lisinopril , Ozempic  (0.25 or 0.5 mg-dose) [semaglutide (0.25 or 0.5mg -dos)], and Tresiba  [insulin  degludec]   Assessment / Plan:     Visit Diagnoses: Rheumatoid arthritis involving both hands with positive rheumatoid factor (HCC) - Plan: Sedimentation rate, C-reactive protein, C3 and C4, XR Hand 2 View Right, XR Hand 2 View Left Chronic inflammatory arthritis with positive rheumatoid antibodies and psoriasis. Differential includes rheumatoid arthritis and psoriatic arthritis. Symptoms: pain in left thumb, elbow, back, psoriasis on palms. Previous methotrexate and leflunomide. Current hydrocodone  for pain. Discussed trial of oral medication next if active rheumatoid arthritis indicated although no inflammation is apparent on exam today. Consider osteoarthritis as a factor. - Ordered blood tests for inflammatory markers and rheumatoid arthritis activity. - Ordered x-rays of hands for joint changes. - Scheduled follow-up for test results and medication options.  High risk medication use - Plan: Hepatitis C antibody, CBC with Differential/Platelet, Comprehensive metabolic panel with GFR Checking baseline labs for considering new RA or psoriasis medication start.  Screening hepatitis C which she likely has but do not see clearly on file recently.  Updating CBC CMP for medication monitoring.  If normal could be candidate for trying again with oral DMARDs such as methotrexate or leflunomide.  Osteoarthritis of the hands and fingers Chronic osteoarthritis contributing to joint pain and structural changes. Symptoms: pain and swelling in left hand. Previous carpal tunnel surgery and trigger  finger release. Discussed overlap with neuropathy treatment. - Consider treatment options for both osteoarthritis and neuropathy symptoms.  Peripheral neuropathy Chronic peripheral  neuropathy with symptoms of walking on marshmallows from decreased foot light sensation. Potential overlap with osteoarthritis treatment.  Edema of lower extremities Intermittent edema managed with diuretics and compression socks.  Onychomycosis of toenails Chronic onychomycosis managed with topical ciclopirox. Consider addition of topical zinc oxide products as well. Looks more infectious and not typical of psoriatic dystrophy at this time.  HIV infection, well controlled HIV well controlled with undetectable viral load. No opportunistic infections or complications. Discussed potential impact on inflammatory markers and rheumatoid arthritis diagnosis.       Orders: Orders Placed This Encounter  Procedures   XR Hand 2 View Right   XR Hand 2 View Left   Hepatitis C antibody   Sedimentation rate   C-reactive protein   C3 and C4   CBC with Differential/Platelet   Comprehensive metabolic panel with GFR   No orders of the defined types were placed in this encounter.  Follow-Up Instructions: Return in about 2 months (around 10/09/2024) for New pt RA f/u 2mos.   Lonni LELON Ester, MD  Note - This record has been created using [employer].  Chart creation errors have been sought, but may not always  have been located. Such creation errors do not reflect on  the standard of medical care.

## 2024-08-10 LAB — COMPREHENSIVE METABOLIC PANEL WITH GFR
AG Ratio: 1.6 (calc) (ref 1.0–2.5)
ALT: 32 U/L (ref 9–46)
AST: 26 U/L (ref 10–35)
Albumin: 4.5 g/dL (ref 3.6–5.1)
Alkaline phosphatase (APISO): 57 U/L (ref 35–144)
BUN: 22 mg/dL (ref 7–25)
CO2: 26 mmol/L (ref 20–32)
Calcium: 9.8 mg/dL (ref 8.6–10.3)
Chloride: 102 mmol/L (ref 98–110)
Creat: 0.85 mg/dL (ref 0.70–1.28)
Globulin: 2.8 g/dL (ref 1.9–3.7)
Glucose, Bld: 121 mg/dL — ABNORMAL HIGH (ref 65–99)
Potassium: 3.9 mmol/L (ref 3.5–5.3)
Sodium: 141 mmol/L (ref 135–146)
Total Bilirubin: 1.1 mg/dL (ref 0.2–1.2)
Total Protein: 7.3 g/dL (ref 6.1–8.1)
eGFR: 93 mL/min/{1.73_m2}

## 2024-08-10 LAB — CBC WITH DIFFERENTIAL/PLATELET
Absolute Lymphocytes: 1588 {cells}/uL (ref 850–3900)
Absolute Monocytes: 594 {cells}/uL (ref 200–950)
Basophils Absolute: 70 {cells}/uL (ref 0–200)
Basophils Relative: 1.3 %
Eosinophils Absolute: 151 {cells}/uL (ref 15–500)
Eosinophils Relative: 2.8 %
HCT: 43 % (ref 38.5–50.0)
Hemoglobin: 14.2 g/dL (ref 13.2–17.1)
MCH: 27.7 pg (ref 27.0–33.0)
MCHC: 33 g/dL (ref 32.0–36.0)
MCV: 83.8 fL (ref 80.0–100.0)
MPV: 11.6 fL (ref 7.5–12.5)
Monocytes Relative: 11 %
Neutro Abs: 2997 {cells}/uL (ref 1500–7800)
Neutrophils Relative %: 55.5 %
Platelets: 93 10*3/uL — ABNORMAL LOW (ref 140–400)
RBC: 5.13 Million/uL (ref 4.20–5.80)
RDW: 14.6 % (ref 11.0–15.0)
Total Lymphocyte: 29.4 %
WBC: 5.4 10*3/uL (ref 3.8–10.8)

## 2024-08-10 LAB — C3 AND C4
C3 Complement: 168 mg/dL (ref 82–185)
C4 Complement: 16 mg/dL (ref 15–53)

## 2024-08-10 LAB — C-REACTIVE PROTEIN: CRP: 3.7 mg/L (ref <8.0)

## 2024-08-10 LAB — HEPATITIS C ANTIBODY: Hepatitis C Ab: NONREACTIVE

## 2024-08-10 LAB — SEDIMENTATION RATE: Sed Rate: 2 mm/h (ref 0–20)

## 2024-08-14 ENCOUNTER — Encounter: Payer: Self-pay | Admitting: Internal Medicine

## 2024-08-14 ENCOUNTER — Ambulatory Visit: Payer: Self-pay | Admitting: Internal Medicine

## 2024-08-14 VITALS — BP 174/99 | HR 76 | Temp 97.3°F | Ht 70.0 in | Wt 219.6 lb

## 2024-08-14 DIAGNOSIS — Z833 Family history of diabetes mellitus: Secondary | ICD-10-CM

## 2024-08-14 DIAGNOSIS — R519 Headache, unspecified: Secondary | ICD-10-CM

## 2024-08-14 DIAGNOSIS — Z8249 Family history of ischemic heart disease and other diseases of the circulatory system: Secondary | ICD-10-CM

## 2024-08-14 DIAGNOSIS — Z87891 Personal history of nicotine dependence: Secondary | ICD-10-CM

## 2024-08-14 DIAGNOSIS — E11618 Type 2 diabetes mellitus with other diabetic arthropathy: Secondary | ICD-10-CM | POA: Diagnosis not present

## 2024-08-14 DIAGNOSIS — Z794 Long term (current) use of insulin: Secondary | ICD-10-CM

## 2024-08-14 DIAGNOSIS — I10 Essential (primary) hypertension: Secondary | ICD-10-CM

## 2024-08-14 DIAGNOSIS — Z79891 Long term (current) use of opiate analgesic: Secondary | ICD-10-CM

## 2024-08-14 DIAGNOSIS — Z7985 Long-term (current) use of injectable non-insulin antidiabetic drugs: Secondary | ICD-10-CM | POA: Diagnosis not present

## 2024-08-14 DIAGNOSIS — I872 Venous insufficiency (chronic) (peripheral): Secondary | ICD-10-CM

## 2024-08-14 DIAGNOSIS — R079 Chest pain, unspecified: Secondary | ICD-10-CM | POA: Diagnosis not present

## 2024-08-14 DIAGNOSIS — B20 Human immunodeficiency virus [HIV] disease: Secondary | ICD-10-CM

## 2024-08-14 DIAGNOSIS — Z79899 Other long term (current) drug therapy: Secondary | ICD-10-CM

## 2024-08-14 MED ORDER — HYDROCODONE-ACETAMINOPHEN 7.5-325 MG PO TABS
1.0000 | ORAL_TABLET | Freq: Two times a day (BID) | ORAL | 0 refills | Status: AC | PRN
Start: 2024-10-13 — End: ?

## 2024-08-14 MED ORDER — AMLODIPINE BESYLATE 10 MG PO TABS: 10.0000 mg | ORAL_TABLET | Freq: Every day | ORAL | 3 refills | Status: AC

## 2024-08-14 MED ORDER — HYDROCODONE-ACETAMINOPHEN 7.5-325 MG PO TABS: 1.0000 | ORAL_TABLET | Freq: Two times a day (BID) | ORAL | 0 refills | Status: AC | PRN

## 2024-08-14 MED ORDER — CARVEDILOL 6.25 MG PO TABS: 6.2500 mg | ORAL_TABLET | Freq: Two times a day (BID) | ORAL | 11 refills | Status: AC

## 2024-08-14 NOTE — Assessment & Plan Note (Signed)
 Uncontrolled. Initial BP today 184/99, repeat 174/99. Patient takes BP at home 1-3 times a day, and BP has been consistently elevated. Patient reports headache, chest pain, dizziness, and nausea, which may be results of uncontrolled HTN.   Plan:  -start amlodipine  10 mg daily  -increase carvedilol  to 6.25 mg twice daily  -continue telmisartan 50 mg daily  -follow up in 3 weeks for HTN management   Orders:   carvedilol  (COREG ) 6.25 MG tablet; Take 1 tablet (6.25 mg total) by mouth 2 (two) times daily with a meal.

## 2024-08-14 NOTE — Assessment & Plan Note (Addendum)
 Improved. 1+ pitting edema on exam. Continue on torsemide 20 mg daily.

## 2024-08-14 NOTE — Progress Notes (Signed)
 This is a Psychologist, Occupational Note.  The care of the patient was discussed with Dr. Rosan and the assessment and plan was formulated with their assistance.  Please see their note for official documentation of the patient encounter.   Subjective:   Patient ID: Riley Jones male   DOB: 01-Nov-1955 68 y.o.   MRN: [id number]  HPI: [name] is a 68 y.o. male with past medical history of HTN, T2DM, CAD, COPD, rheumatoid arthritis, and HIV on Biktarvy  who presents to the clinic for follow up for HTN management and leg swelling.   Patient coming in with acute concern for headache. Patient reports that in the last couple of weeks, he has been getting a severe, sharp, splitting headache at the crown of his head, and sometimes behind his right eye, but denies vision changes. Patient states that they start in the morning, and are consistent throughout the day. These headaches have gradually been getting worse since they started. Patient has been taking Tylenol  for headache relief for most days since the start of the headaches, which he says helps a little bit. Patient denies anything making headaches worse. Of note, patient also endorses a sharp pain that he will sometimes get on the right side of his neck with head rotation to the left.   Patient also endorses chest pain for the last few weeks. Patient reports that today is the first day he has not had this chest pain in the past few weeks. Before today, the chest pain has been consistent and occurring virtually everyday. Patient states that pain usually feels like a needle sticking into my chest, and reports that it feels like it has been bruised when he palpates his chest. Patient will also sometimes have nausea and dizziness when this chest pain occurs. Denies vomiting, SOB, or radiation of pain down the arm. Patient thinks the pain might have something to do with his GERD and will take Pepto bismol, which will help a little bit during these episodes.  Patient is prescribed nitroglycerin , but denies taking any during these episodes.   Additionally patient reports that a few times in the last week or week before, his blood sugar has dropped low, around 45. Patient endorsed feeling lightheaded, dizzy, and presyncopal. Denies syncope.     Past Medical History:  Diagnosis Date   Allergy    Anxiety    Arthritis    pretty much all over   Asthma    mild (07/17/2014)   Atypical angina    Myoview  2003, Normal EF 64%   CAD (coronary artery disease)    a. 10/20 DES to 80% mid LAD   Candida rash of groin 11/03/2019   Cataract    COPD (chronic obstructive pulmonary disease) (HCC)    mild (07/17/2014)   Depression    Emphysema of lung (HCC)    Fatigue 06/22/2019   Foot pain 03/30/2011   GERD (gastroesophageal reflux disease)    H/O hiatal hernia    Headache    weekly (06/2014)   History of gout    HIV (human immunodeficiency virus infection) (HCC) 1995   DX after shingles followed by Dr. Epifanio (ID)   Hx of cardiovascular stress test    ETT-Myoview  (3/16):  Ex 10:15, No Ischemia, EF 57%; Normal Study   Hyperlipidemia    Hypertension    Well Controlled off meds   Increased anion gap metabolic acidosis 01/22/2021   Kidney stones    passed them all   Left  lower quadrant abdominal pain 11/02/2019   Myocardial infarction [facility])    Neuropathy    Other and unspecified angina pectoris 12/18/2008   Overview:  Overview:  Greatly appreciate PCP f/u.   Last Assessment & Plan:  - Patient with episode of CP - 4 days, constant, substernal, not immediately resolved with NTG, felt better after burping and passing gas - Repeat EKG done here with no acute ST/T wave changes - CP now completely resolved. Encouraged patient to call 911 if recurs - To f/u with cardio in the next 2 weeks for further assess   Pneumonia 4-5 times   Psoriasis 2024   Rheumatoid arthritis(714.0)    Followed by Dr. Everlean, off MTX and prdnisone since 2007    Right hip pain 04/25/2019   Right shoulder pain 01/19/2018   Shingles (herpes zoster) polyneuropathy 11/08/2018   Sleep apnea    does not wear mask (07/17/2014)   Type II diabetes mellitus (HCC)    Ulcer    Current Outpatient Medications  Medication Sig Dispense Refill   amLODipine  (NORVASC ) 10 MG tablet Take 1 tablet (10 mg total) by mouth daily. 90 tablet 3   acetaminophen  (TYLENOL ) 650 MG CR tablet Take 650 mg by mouth as needed for pain.     albuterol  (VENTOLIN  HFA) 108 (90 Base) MCG/ACT inhaler Inhale 2 puffs into the lungs every 6 (six) hours as needed for wheezing or shortness of breath. 9 g 3   anastrozole (ARIMIDEX) 1 MG tablet Take 1 mg by mouth 3 (three) times a week.     aspirin  81 MG EC tablet Take 81 mg by mouth daily.       atorvastatin  (LIPITOR) 40 MG tablet Take 1 tablet (40 mg total) by mouth daily. 90 tablet 3   betamethasone  dipropionate 0.05 % cream Apply topically 2 (two) times daily. 45 each 1   bictegravir-emtricitabine -tenofovir  AF (BIKTARVY ) 50-200-25 MG TABS tablet Take 1 tablet by mouth daily. 30 tablet 11   carvedilol  (COREG ) 6.25 MG tablet Take 1 tablet (6.25 mg total) by mouth 2 (two) times daily with a meal. 180 tablet 11   ciclopirox (PENLAC) 8 % solution Apply topically at bedtime. Apply over nail and surrounding skin. Apply daily over previous coat. After seven (7) days, may remove with alcohol and continue cycle.     clopidogrel  (PLAVIX ) 75 MG tablet TAKE 1 TABLET BY MOUTH EVERY DAY 90 tablet 3   clotrimazole -betamethasone  (LOTRISONE ) cream SMARTSIG:sparingly Topical Twice Daily PRN     Continuous Glucose Receiver (FREESTYLE LIBRE 2 READER) DEVI by Does not apply route.     Continuous Glucose Sensor (FREESTYLE LIBRE 2 SENSOR) MISC by Does not apply route.     Dermatological Products, Misc. (KERASAL FUNGAL NAIL RENEWAL EX) Apply topically.     Empagliflozin -metFORMIN  HCl ER (SYNJARDY  XR) 12.01-999 MG TB24 Take 1 tablet by mouth 2 (two) times daily. 180  tablet 3   HYDROcodone -acetaminophen  (NORCO) 7.5-325 MG tablet Take 1 tablet by mouth 2 (two) times daily as needed for moderate pain (pain score 4-6). 60 tablet 0   [START ON 09/13/2024] HYDROcodone -acetaminophen  (NORCO) 7.5-325 MG tablet Take 1 tablet by mouth 2 (two) times daily as needed for moderate pain (pain score 4-6). 60 tablet 0   [START ON 10/13/2024] HYDROcodone -acetaminophen  (NORCO) 7.5-325 MG tablet Take 1 tablet by mouth 2 (two) times daily as needed for moderate pain (pain score 4-6). 60 tablet 0   insulin  aspart (FIASP  FLEXTOUCH) 100 UNIT/ML FlexTouch Pen Inject 14 Units into the skin  3 (three) times daily with meals. 15 mL 11   insulin  glargine, 2 Unit Dial, (TOUJEO  MAX SOLOSTAR) 300 UNIT/ML Solostar Pen Inject 80 Units into the skin at bedtime. (Patient not taking: Reported on 08/09/2024) 15 mL 5   Insulin  Pen Needle (BD PEN NEEDLE MICRO U/F) 32G X 6 MM MISC 1 Units by Does not apply route 4 (four) times daily. 100 each 11   loperamide (IMODIUM A-D) 2 MG tablet Take 2 mg by mouth 4 (four) times daily as needed for diarrhea or loose stools.     loratadine  (CLARITIN ) 10 MG tablet Take 1 tablet (10 mg total) by mouth daily. 90 tablet 3   Multiple Vitamins-Minerals (CENTRUM ADULT 50+ MULTIGUMMIES PO) Take by mouth.     Multiple Vitamins-Minerals (CENTRUM ADULTS PO) Take 1 tablet by mouth daily. (Patient not taking: Reported on 08/09/2024)     nitroGLYCERIN  (NITROSTAT ) 0.4 MG SL tablet PLACE 1 TABLET UNDER THE TONGUE EVERY 5 MINUTES AS NEEDED FOR CHEST PAIN (Patient not taking: Reported on 08/09/2024) 25 tablet 3   omeprazole  (PRILOSEC) 20 MG capsule TAKE 1 CAPSULE BY MOUTH AS NEEDED 90 capsule 0   tadalafil  (CIALIS ) 5 MG tablet Take 1 tablet (5 mg total) by mouth daily. 90 tablet 1   tamsulosin  (FLOMAX ) 0.4 MG CAPS capsule Take 1 capsule (0.4 mg total) by mouth daily. 90 capsule 1   telmisartan (MICARDIS) 80 MG tablet Take 1 tablet (80 mg total) by mouth daily. 90 tablet 3    Testosterone  1.62 % GEL Place 2 Pump onto the skin daily. Each shoulder     Tiotropium Bromide Monohydrate  (SPIRIVA  RESPIMAT) 2.5 MCG/ACT AERS Inhale 2 puffs into the lungs daily. 4 g 11   tirzepatide  (MOUNJARO ) 7.5 MG/0.5ML Pen Inject 7.5 mg into the skin once a week. 2 mL 5   torsemide (DEMADEX) 20 MG tablet Take 1 tablet (20 mg total) by mouth daily as needed (leg swelling). 30 tablet 2   No current facility-administered medications for this visit.   Family History  Problem Relation Age of Onset   Cancer Mother    Osteoporosis Mother    Diabetes Mother    Heart disease Father    Heart attack Father    Diabetes Father    COPD Father    Brain cancer Sister    Breast cancer Sister    Obesity Sister    Cancer Brother        Prostate   Heart attack Brother    Cancer Brother        asophagas   Psoriasis Brother    Stroke Maternal Grandmother    Stroke Maternal Grandfather    Stroke Paternal Grandmother    Stroke Paternal Grandfather    Social History   Socioeconomic History   Marital status: Single    Spouse name: Not on file   Number of children: Not on file   Years of education: 13   Highest education level: Not on file  Occupational History   Occupation: [employer]   Occupation: Naval architect (retired)  Tobacco Use   Smoking status: Former    Current packs/day: 0.00    Average packs/day: 1 pack/day for 46.0 years (46.0 ttl pk-yrs)    Types: Cigarettes    Start date: 12/28/1975    Quit date: 12/27/2021    Years since quitting: 2.6    Passive exposure: Past   Smokeless tobacco: Never  Vaping Use   Vaping status: Never Used  Substance and Sexual Activity  Alcohol use: Not Currently    Alcohol/week: 1.0 standard drink of alcohol    Types: 1 Glasses of wine per week    Comment: 07/17/2014 glass of wine once or twice/month   Drug use: No   Sexual activity: Yes    Partners: Male    Comment: declined condoms  Other Topics Concern   Not on file  Social History Narrative    [place] approved beginning 12/11/2009 - 12/27/2010   [employer] benefits approved; patient eligible for 100% discount for out patient labs and office visits. Patient eligible for 70% discount for other services per Heron Goodell 09/08/2010      Patient is on disability, and walks 2-3 times per week.    Social Drivers of Corporate Investment Banker Strain: Low Risk  (04/05/2024)   Overall Financial Resource Strain (CARDIA)    Difficulty of Paying Living Expenses: Not hard at all  Food Insecurity: No Food Insecurity (04/05/2024)   Hunger Vital Sign    Worried About Running Out of Food in the Last Year: Never true    Ran Out of Food in the Last Year: Never true  Transportation Needs: No Transportation Needs (04/05/2024)   PRAPARE - Administrator, Civil Service (Medical): No    Lack of Transportation (Non-Medical): No  Physical Activity: Sufficiently Active (04/05/2024)   Exercise Vital Sign    Days of Exercise per Week: 5 days    Minutes of Exercise per Session: 30 min  Stress: No Stress Concern Present (04/05/2024)   [employer] of Occupational Health - Occupational Stress Questionnaire    Feeling of Stress: Not at all  Social Connections: Socially Isolated (04/05/2024)   Social Connection and Isolation Panel    Frequency of Communication with Friends and Family: More than three times a week    Frequency of Social Gatherings with Friends and Family: More than three times a week    Attends Religious Services: Never    Database Administrator or Organizations: No    Attends Banker Meetings: Never    Marital Status: Never married   Review of Systems: Pertinent items are noted in HPI. Objective:  Physical Exam: Vitals:   08/14/24 0842 08/14/24 0941 08/14/24 0945  BP: (!) 187/99 (!) 184/99 (!) 174/99  Pulse: 78 72 76  Temp: (!) 97.3 F (36.3 C)    TempSrc: Oral    SpO2: 96%    Weight: 219 lb 9.6 oz (99.6 kg)    Height: 5' 10 (1.778 m)     BP (!) 174/99  (BP Location: Right Arm, Patient Position: Sitting, Cuff Size: Large)   Pulse 76   Temp (!) 97.3 F (36.3 C) (Oral)   Ht 5' 10 (1.778 m)   Wt 219 lb 9.6 oz (99.6 kg)   SpO2 96% Comment: RA  BMI 31.51 kg/m   General Appearance:    Alert, cooperative, no distress, appears stated age  Head:    Normocephalic, without obvious abnormality, atraumatic. Tender to palpation at the crown of head.   Eyes:    PERRL, conjunctiva/corneas clear, no hemorrhages, papilledema noted on exam   Lungs:     Clear to auscultation bilaterally, respirations unlabored  Heart:    Regular rate and rhythm, S1 and S2 normal, no murmur, rub   or gallop  Extremities:   1+ pitting edema in bilateral lower extremities    Assessment & Plan:   Assessment & Plan Essential hypertension Uncontrolled. Initial BP today 184/99,  repeat 174/99. Patient takes BP at home 1-3 times a day, and BP has been consistently elevated. Patient reports headache, chest pain, dizziness, and nausea, which may be results of uncontrolled HTN.   Plan:  -start amlodipine  10 mg daily  -increase carvedilol  to 6.25 mg twice daily  -continue telmisartan 50 mg daily  -follow up in 3 weeks for HTN management   Orders:   carvedilol  (COREG ) 6.25 MG tablet; Take 1 tablet (6.25 mg total) by mouth 2 (two) times daily with a meal.  Nonintractable headache, unspecified chronicity pattern, unspecified headache type Patient reports few weeks of splitting, consistent headache at crown of his head and behind right eye that has gradually been getting worse. Denies vision changes. Tender to palpation at the crown of head on exam. No concerning ocular findings on exam. Headache most likely due to uncontrolled HTN. Of note, patient has also been taking Tylenol  for headache relief on most days; headache could also be product of medication overuse.   Plan:  -establish better control of HTN to see if this helps with relief of headache -stop Tylenol  for at least 2  weeks to see if that makes a difference in headache  -will follow up in 3 weeks  -if headache not better with improved BP, consider MRI to further assess for other causes of headache such as hemorrhage or aneurysm     Chest pain, unspecified type  Denies having the chest pain today, but patient endorses consistent, stabbing chest pain for the past few weeks with occasional nausea and dizziness. Denies vomiting, SOB, or radiation of pain down the arm. Cardio exam unremarkable. Patient has history of CAD is continuing to take aspirin , plavix , and Lipitor. Advised patient to take prescribed nitroglycerin  when chest pain occurs, and to have close follow up with his cardiologist. Forgo EKG due to denial of chest pain today.     Type 2 diabetes mellitus with other diabetic arthropathy, with long-term current use of insulin  (HCC) Controlled. [employer] in rage 76%. GMI 7. Average daily glucose level 153. Reports a few hypoglycemic episodes within the past couple of weeks with glucose getting down to 45. Patient has been symptomatic with these episodes (lightheadedness, dizziness, presyncopal), but is able to get blood glucose level back up. Patient continues to take Synjardy  twice daily, Fiasp  18 units with meals (2-3 times a day), Lantus  80 units daily (will switch to Toujeo  when Lantus  runs out due to insurance), and Mounjaro  5 mg once weekly.   -continue taking Synjardy  XR twice daily  -continue Fiasp  18 units with meals -continue long acting insulin  80 units daily, consider decreasing units if patient starts to become hypoglycemic more often  -patient will increase to Mounjaro  7.5 once weekly this Wednesday (11/19)    Venous reflux Improved. 1+ pitting edema on exam. Continue on torsemide 20 mg daily.      Discussed and evaluated with Dr. Rosan and discussed plan with Dr. Rosan.    Cozetta Pereyra, MS3

## 2024-08-14 NOTE — Patient Instructions (Signed)
 Call Dr Claiborne office to schedule your annual Cardiology visit. [phone]

## 2024-08-18 ENCOUNTER — Encounter: Payer: Self-pay | Admitting: Internal Medicine

## 2024-08-18 NOTE — Progress Notes (Signed)
 Attestation for Student Documentation:  I personally was present and re-performed the history, physical exam and medical decision-making activities of this service and have verified that the service and findings are accurately documented in the student's note.  Rosan Dayton BROCKS, DO 08/18/2024, 10:58 AM

## 2024-08-23 ENCOUNTER — Telehealth: Payer: Self-pay | Admitting: Cardiovascular Disease

## 2024-08-23 NOTE — Telephone Encounter (Signed)
 Patient sent message via pt schedule message: My last five readings are 160/83 170/92 160/85 162/90 163/83 and 150/77. I quit taking some of my other medicines not related to my heart and they've gone down some. These are my lowest ones last week week before they were in the 80s and 90s And I've had headaches for several weeks that just don't go away in my ears ringing and then I have to be careful cause I get dizzy sometimes so Dr. Rosan told me to give a shout out to you guys.  ----- Message -----  From: Riley Jones  Sent: 08/21/2024   3:19 PM EST  To: Mychart, Generic  Subject: Appointment Request                              Good afternoon,    Could you please answer the following questions:    1. What is your blood pressure concern?    2. Have you taken any blood pressure medication today?    3. What are your last 5 blood pressure readings?    4. Are you having any other symptoms (ex. Dizziness, headache, blurred vision, passed out)?

## 2024-08-23 NOTE — Telephone Encounter (Signed)
 Spoke to patient he stated his PCP Dr.Hoffman advised him to schedule appointment with Dr.Nishan.Stated his B/P has been elevated,but has been doing better.Last night 152/81 this morning 156/84.Did not check pulse.Stated he feels good.Appointment scheduled with Dr.Nishan 12/10 at 3:00 pm.Advised to monitor B/P daily and bring readings and a list of all medications.

## 2024-08-29 DIAGNOSIS — E1165 Type 2 diabetes mellitus with hyperglycemia: Secondary | ICD-10-CM | POA: Diagnosis not present

## 2024-08-30 NOTE — Progress Notes (Signed)
 CARDIOLOGY OFFICE NOTE  Date:  09/06/2024    Riley Jones Date of Birth: April 12, 1956 Medical Record #[id number]  PCP:  Rosan Dayton BROCKS, DO  Cardiologist:  Army ODESSIA Jones    No chief complaint on file.    History of Present Illness: Riley Jones is a 68 y.o. male has a history of known CAD with LAD stent in 2015, ongoing tobacco use, DM-2, HIV, RA, HLD, HTN and chronic pain syndrome. Last ischemic evaluation 02/28/17 normal myovue no ischemia EF 53% He continues to smoke had rash with nicotine patch and Chantix  not helpful. Lung cancer CT 04/17/22  benign just emphysema given 44 pack year smoking history DM poorly controlled with A1c over 8 Activity limited by RA with hand, back, hip pain on Norco    Father died in [place] and patient able to retire after inheritance   Myovue 04/23/21 normal no ischemia EF 53% TTE EF 65-70% mild LVH , AV sclerosis Ao 4.1 cm  Still at [facility] lives in [place] Quit smoking  April 2023  BP has been elevated at home. Now on norvasc  10 mg coreg  6.25 bid micardis  80 mg daily and demadex  20 mg daily. BMET 08/09/24 was normal with K 3.9 and Cr 0.88  He is sedentary He has poor diet Retired in February 2025 and BS been poorly controlled  Has had a rash and headache Likely from Monjouro similar side effects from Ozempic  He has stopped his testosterone  cream which was dangerously elevating his BP     Past Medical History:  Diagnosis Date   Allergy    Anxiety    Arthritis    pretty much all over   Asthma    mild (07/17/2014)   Atypical angina    Myoview  2003, Normal EF 64%   CAD (coronary artery disease)    a. 10/20 DES to 80% mid LAD   Candida rash of groin 11/03/2019   Cataract    COPD (chronic obstructive pulmonary disease) (HCC)    mild (07/17/2014)   Depression    Emphysema of lung (HCC)    Fatigue 06/22/2019   Foot pain 03/30/2011   GERD (gastroesophageal reflux disease)    H/O hiatal hernia    Headache    weekly  (06/2014)   History of gout    HIV (human immunodeficiency virus infection) (HCC) 1995   DX after shingles followed by Dr. Epifanio (ID)   Hx of cardiovascular stress test    ETT-Myoview  (3/16):  Ex 10:15, No Ischemia, EF 57%; Normal Study   Hyperlipidemia    Hypertension    Well Controlled off meds   Increased anion gap metabolic acidosis 01/22/2021   Kidney stones    passed them all   Left lower quadrant abdominal pain 11/02/2019   Myocardial infarction [facility])    Neuropathy    Other and unspecified angina pectoris 12/18/2008   Overview:  Overview:  Greatly appreciate PCP f/u.   Last Assessment & Plan:  - Patient with episode of CP - 4 days, constant, substernal, not immediately resolved with NTG, felt better after burping and passing gas - Repeat EKG done here with no acute ST/T wave changes - CP now completely resolved. Encouraged patient to call 911 if recurs - To f/u with cardio in the next 2 weeks for further assess   Pneumonia 4-5 times   Psoriasis 2024   Rheumatoid arthritis(714.0)    Followed by Dr. Everlean, off MTX and prdnisone since 2007  Right hip pain 04/25/2019   Right shoulder pain 01/19/2018   Shingles (herpes zoster) polyneuropathy 11/08/2018   Sleep apnea    does not wear mask (07/17/2014)   Type II diabetes mellitus (HCC)    Ulcer     Past Surgical History:  Procedure Laterality Date   APPENDECTOMY  ~ 1981   CARPAL TUNNEL RELEASE Left 1990's   CORONARY ANGIOPLASTY WITH STENT PLACEMENT  07/17/2014   a. DES to 80% mid LAD   HERNIA REPAIR     LAPAROSCOPIC INCISIONAL / UMBILICAL / VENTRAL HERNIA REPAIR  1990's   it was a double; not inguinal   LEFT HEART CATHETERIZATION WITH CORONARY ANGIOGRAM N/A 07/17/2014   Procedure: LEFT HEART CATHETERIZATION WITH CORONARY ANGIOGRAM;  Surgeon: Riley JAYSON Emmer, MD;  Location: [facility] CATH LAB;  Service: Cardiovascular;  Laterality: N/A;   PERCUTANEOUS STENT INTERVENTION  07/17/2014   Procedure: PERCUTANEOUS STENT  INTERVENTION;  Surgeon: Riley JAYSON Emmer, MD;  Location: [facility] CATH LAB;  Service: Cardiovascular;;   TONSILLECTOMY AND ADENOIDECTOMY  1990's   TRIGGER FINGER RELEASE Left 1990's   2nd & 5th digits   UMBILICAL HERNIA REPAIR  1980's   w/mesh   UVULOPALATOPHARYNGOPLASTY (UPPP)/TONSILLECTOMY/SEPTOPLASTY  1990's     Medications: Current Meds  Medication Sig   acetaminophen  (TYLENOL ) 650 MG CR tablet Take 650 mg by mouth as needed for pain.   albuterol  (VENTOLIN  HFA) 108 (90 Base) MCG/ACT inhaler Inhale 2 puffs into the lungs every 6 (six) hours as needed for wheezing or shortness of breath.   amLODipine  (NORVASC ) 10 MG tablet Take 1 tablet (10 mg total) by mouth daily.   anastrozole (ARIMIDEX) 1 MG tablet Take 1 mg by mouth 3 (three) times a week.   aspirin  81 MG EC tablet Take 81 mg by mouth daily.     atorvastatin  (LIPITOR) 40 MG tablet Take 1 tablet (40 mg total) by mouth daily.   betamethasone  dipropionate 0.05 % cream Apply topically 2 (two) times daily.   bictegravir-emtricitabine -tenofovir  AF (BIKTARVY ) 50-200-25 MG TABS tablet Take 1 tablet by mouth daily.   carvedilol  (COREG ) 6.25 MG tablet Take 1 tablet (6.25 mg total) by mouth 2 (two) times daily with a meal.   ciclopirox (PENLAC) 8 % solution Apply topically at bedtime. Apply over nail and surrounding skin. Apply daily over previous coat. After seven (7) days, may remove with alcohol and continue cycle.   clopidogrel  (PLAVIX ) 75 MG tablet TAKE 1 TABLET BY MOUTH EVERY DAY   clotrimazole -betamethasone  (LOTRISONE ) cream SMARTSIG:sparingly Topical Twice Daily PRN   Continuous Glucose Receiver (FREESTYLE LIBRE 2 READER) DEVI by Does not apply route.   Continuous Glucose Sensor (FREESTYLE LIBRE 2 SENSOR) MISC by Does not apply route.   Dermatological Products, Misc. (KERASAL FUNGAL NAIL RENEWAL EX) Apply topically.   Empagliflozin -metFORMIN  HCl ER (SYNJARDY  XR) 12.01-999 MG TB24 Take 1 tablet by mouth 2 (two) times daily.    HYDROcodone -acetaminophen  (NORCO) 7.5-325 MG tablet Take 1 tablet by mouth 2 (two) times daily as needed for moderate pain (pain score 4-6).   [START ON 09/13/2024] HYDROcodone -acetaminophen  (NORCO) 7.5-325 MG tablet Take 1 tablet by mouth 2 (two) times daily as needed for moderate pain (pain score 4-6).   [START ON 10/13/2024] HYDROcodone -acetaminophen  (NORCO) 7.5-325 MG tablet Take 1 tablet by mouth 2 (two) times daily as needed for moderate pain (pain score 4-6).   insulin  aspart (FIASP  FLEXTOUCH) 100 UNIT/ML FlexTouch Pen Inject 14 Units into the skin 3 (three) times daily with meals.   insulin  glargine, 2  Unit Dial, (TOUJEO  MAX SOLOSTAR) 300 UNIT/ML Solostar Pen Inject 80 Units into the skin at bedtime.   Insulin  Pen Needle (BD PEN NEEDLE MICRO U/F) 32G X 6 MM MISC 1 Units by Does not apply route 4 (four) times daily.   loperamide (IMODIUM A-D) 2 MG tablet Take 2 mg by mouth 4 (four) times daily as needed for diarrhea or loose stools.   loratadine  (CLARITIN ) 10 MG tablet Take 1 tablet (10 mg total) by mouth daily.   Multiple Vitamins-Minerals (CENTRUM ADULT 50+ MULTIGUMMIES PO) Take by mouth.   Multiple Vitamins-Minerals (CENTRUM ADULTS PO) Take 1 tablet by mouth daily.   nitroGLYCERIN  (NITROSTAT ) 0.4 MG SL tablet PLACE 1 TABLET UNDER THE TONGUE EVERY 5 MINUTES AS NEEDED FOR CHEST PAIN   omeprazole  (PRILOSEC) 20 MG capsule TAKE 1 CAPSULE BY MOUTH AS NEEDED   tadalafil  (CIALIS ) 5 MG tablet Take 1 tablet (5 mg total) by mouth daily.   tamsulosin  (FLOMAX ) 0.4 MG CAPS capsule Take 1 capsule (0.4 mg total) by mouth daily.   telmisartan  (MICARDIS ) 80 MG tablet Take 1 tablet (80 mg total) by mouth daily.   Testosterone  1.62 % GEL Place 2 Pump onto the skin daily. Each shoulder   Tiotropium Bromide Monohydrate  (SPIRIVA  RESPIMAT) 2.5 MCG/ACT AERS Inhale 2 puffs into the lungs daily.   tirzepatide  (MOUNJARO ) 7.5 MG/0.5ML Pen Inject 7.5 mg into the skin once a week.   torsemide  (DEMADEX ) 20 MG tablet Take  1 tablet (20 mg total) by mouth daily as needed (leg swelling).     Allergies: Allergies  Allergen Reactions   Penicillins Hives, Nausea Only and Rash    Has patient had a PCN reaction causing immediate rash, facial/tongue/throat swelling, SOB or lightheadedness with hypotension: Yes Has patient had a PCN reaction causing severe rash involving mucus membranes or skin necrosis: No Has patient had a PCN reaction that required hospitalization: No Has patient had a PCN reaction occurring within the last 10 years: No If all of the above answers are NO, then may proceed with Cephalosporin use.    Zoloft  [Sertraline  Hcl] Other (See Comments)    Patient reported Psychomotor slowing / worsened depression   Victoza  [Liraglutide ]     Did not tolerate this medication.  Complained of dizziness even at the lowest dose   Chantix  [Varenicline ] Hives   Gabapentin  Itching   Lisinopril  Cough   Ozempic  (0.25 Or 0.5 Mg-Dose) [Semaglutide (0.25 Or 0.5mg -Dos)] Itching and Rash    Rash to chest wall.   Tresiba  [Insulin  Degludec] Rash    Social History: The patient  reports that he quit smoking about 2 years ago. His smoking use included cigarettes. He started smoking about 48 years ago. He has a 46 pack-year smoking history. He has been exposed to tobacco smoke. He has never used smokeless tobacco. He reports that he does not currently use alcohol after a past usage of about 1.0 standard drink of alcohol per week. He reports that he does not use drugs.   Family History: The patient's family history includes Brain cancer in his sister; Breast cancer in his sister; COPD in his father; Cancer in his brother, brother, and mother; Diabetes in his father and mother; Heart attack in his brother and father; Heart disease in his father; Obesity in his sister; Osteoporosis in his mother; Psoriasis in his brother; Stroke in his maternal grandfather, maternal grandmother, paternal grandfather, and paternal grandmother.    Review of Systems: Please see the history of present illness.   All other  systems are reviewed and negative.   Physical Exam: VS:  BP (!) 155/89 (BP Location: Right Arm, Patient Position: Sitting, Cuff Size: Normal)   Pulse 82   Ht 5' 10 (1.778 m)   Wt 219 lb 3.2 oz (99.4 kg)   SpO2 94%   BMI 31.45 kg/m  .  BMI Body mass index is 31.45 kg/m.  Wt Readings from Last 3 Encounters:  09/06/24 219 lb 3.2 oz (99.4 kg)  08/14/24 219 lb 9.6 oz (99.6 kg)  08/09/24 219 lb 9.6 oz (99.6 kg)    General: Alert and in no acute distress.   Cardiac: Regular rate and rhythm. Heart tones are distant. No edema.  Respiratory:  Lungs are coarse but with normal work of breathing.  GI: Soft and nontender.  MS: No deformity or atrophy. Gait and ROM intact.  Skin: Warm and dry. Color is normal.  Neuro:  Strength and sensation are intact and no gross focal deficits noted.  Psych: Alert, appropriate and with normal affect.   LABORATORY DATA:  EKG:  EKG is ordered today.  Personally reviewed by me. This demonstrates NSR with PAC noted. HR is 72 today.   Lab Results  Component Value Date   WBC 5.4 08/09/2024   HGB 14.2 08/09/2024   HCT 43.0 08/09/2024   PLT 93 (L) 08/09/2024   GLUCOSE 121 (H) 08/09/2024   CHOL 169 03/13/2024   TRIG 396 (H) 03/13/2024   HDL 40 03/13/2024   LDLCALC 81 03/13/2024   ALT 32 08/09/2024   AST 26 08/09/2024   NA 141 08/09/2024   K 3.9 08/09/2024   CL 102 08/09/2024   CREATININE 0.85 08/09/2024   BUN 22 08/09/2024   CO2 26 08/09/2024   TSH 1.590 06/22/2019   PSA 1.09 05/07/2009   INR 0.9 07/10/2014   HGBA1C 9.4 (A) 07/10/2024   MICROALBUR 1.9 11/22/2014     BNP (last 3 results) Recent Labs    03/27/24 1004  BNP 20.4    ProBNP (last 3 results) No results for input(s): PROBNP in the last 8760 hours.   Other Studies Reviewed Today:  Echo Study Conclusions 02/2017   - Left ventricle: The cavity size was normal. Wall thickness was   increased in  a pattern of mild LVH. Systolic function was normal.   The estimated ejection fraction was in the range of 60% to 65%.   Wall motion was normal; there were no regional wall motion   abnormalities. - Aortic valve: Trileaflet; mildly calcified leaflets. - Mitral valve: Calcified annulus. - Right ventricle: The cavity size was mildly dilated. Wall   thickness was normal. Systolic function was normal.   Myoview  Study Result 02/2017     There was no ST segment deviation noted during stress. The study is normal. This is a low risk study. Nuclear stress EF: 53%.   Low risk stress nuclear study with normal perfusion and low normal left ventricular global systolic function.        ASSESSMENT & PLAN:     1. CAD - prior LAD PCI from 2015 - known residual disease - needs aggressive CV risk factor modification. He has no worrisome symptoms  He has been maintained on chronic DAPT, statin and beta blocker. Non ischemic myovue 04/18/22   2. Tobacco abuse - Quit date April  2023 congratulated him on this lung cancer screening CT negative 09/03/23  3. DM - uncontrolled. A1c 9.4 07/10/24 f/u primary   4. HLD - on statin - labs  LDL 78 06/15/23    5. HTN - Add hydrochlorothiazide  as combination pill to Valsartan F/U with HTN clinic and primary BMET in 4 weeks .   6. HIV - followed by Dr. Eben  HIV RNA quant up 45 DC4 T cell count 583   7. Decreased platelet count - 93 on labs 08/09/24 No splenomegaly on CT 04/03/20 per primary consider repeating and referring to hematology  8. Chronic Pain:  from RA on hydrocodone  no red flags f/u Hoffman  9. Weight loss:  side effects from Ozempic  and Mounjaro  would stop both    Micardis /hydrochlorothiazide  80/12.5 mg  BMET 4 weeks F/U HTN clinic 4-6 weeks     Disposition:   FU in a year    Patient is agreeable to this plan and will call if any problems develop in the interim.   Signed: Maude Emmer, MD  09/06/2024 3:28 PM  [facility]  [facility] [address] [place], [place]  [address] Phone: [phone] Fax: [phone]

## 2024-08-31 ENCOUNTER — Encounter: Admitting: Internal Medicine

## 2024-09-06 ENCOUNTER — Ambulatory Visit: Attending: Cardiovascular Disease | Admitting: Cardiovascular Disease

## 2024-09-06 ENCOUNTER — Encounter: Payer: Self-pay | Admitting: Cardiovascular Disease

## 2024-09-06 VITALS — BP 155/89 | HR 82 | Ht 70.0 in | Wt 219.2 lb

## 2024-09-06 DIAGNOSIS — I2510 Atherosclerotic heart disease of native coronary artery without angina pectoris: Secondary | ICD-10-CM

## 2024-09-06 DIAGNOSIS — E782 Mixed hyperlipidemia: Secondary | ICD-10-CM | POA: Diagnosis not present

## 2024-09-06 DIAGNOSIS — I10 Essential (primary) hypertension: Secondary | ICD-10-CM | POA: Diagnosis not present

## 2024-09-06 MED ORDER — TELMISARTAN-HCTZ 80-25 MG PO TABS: 1.0000 | ORAL_TABLET | Freq: Every day | ORAL | 4 refills | Status: AC

## 2024-09-06 NOTE — Patient Instructions (Addendum)
 Medication Instructions:   change to Telmisartan  - hydrochlorothiazide  80 /25 mg daily   Stop taking 80 mg telmisartan   *If you need a refill on your cardiac medications before your next appointment, please call your pharmacy*   Lab Work: 2 weeks BMP  If you have labs (blood work) drawn today and your tests are completely normal, you will receive your results only by: MyChart Message (if you have MyChart) OR A paper copy in the mail If you have any lab test that is abnormal or we need to change your treatment, we will call you to review the results.   Testing/Procedures:  Not needed  Follow-Up: At [facility], you and your health needs are our priority.  As part of our continuing mission to provide you with exceptional heart care, we have created designated Provider Care Teams.  These Care Teams include your primary Cardiologist (physician) and Advanced Practice Providers (APPs -  Physician Assistants and Nurse Practitioners) who all work together to provide you with the care you need, when you need it.     Your next appointment:   12 month(s)  The format for your next appointment:   In Person  Provider:   Maude Emmer, MD   You have been referred to [facility] - blood pressure titration  4 to 5 weeks

## 2024-09-13 ENCOUNTER — Ambulatory Visit: Payer: Self-pay | Admitting: Cardiovascular Disease

## 2024-09-13 LAB — BASIC METABOLIC PANEL WITH GFR
BUN/Creatinine Ratio: 17 (ref 10–24)
BUN: 17 mg/dL (ref 8–27)
CO2: 24 mmol/L (ref 20–29)
Calcium: 10.4 mg/dL — ABNORMAL HIGH (ref 8.6–10.2)
Chloride: 96 mmol/L (ref 96–106)
Creatinine, Ser: 1.01 mg/dL (ref 0.76–1.27)
Glucose: 294 mg/dL — ABNORMAL HIGH (ref 70–99)
Potassium: 4.1 mmol/L (ref 3.5–5.2)
Sodium: 137 mmol/L (ref 134–144)
eGFR: 80 mL/min/1.73 (ref >59)

## 2024-09-13 NOTE — Progress Notes (Unsigned)
 Office Visit    Patient Name: Riley Jones Date of Encounter: 09/14/2024  Primary Care Provider:  Rosan Dayton BROCKS, DO Primary Cardiologist:  Maude Emmer, MD  Chief Complaint    Hypertension  Significant Past Medical History   CAD 2015 PCI to LAD  DM2 10/25 A1c 9.4 on empagliflozin , metformin , insulin  aspart and glargine  HLD 7/25 LDL 65 on atorvastatin  40  RA Chronic pain on hydrocodone        Allergies[1]  History of Present Illness    Riley Jones is a 68 y.o. male patient of Dr Emmer, in the office today for hypertension evaluation.   He was just seen about a week ago by Dr Emmer, who added hydrochlorothiazide  25 mg to his telmisartan  prescription.  Today he reports that he is feeling well, and has no concerns about the addition of hctz.  Has been checking BP at home 2-3 times per day, but didn't bring any of those readings with him.  By memory he believes mostly to see 140's systolic with occasional 130 and highest at 158.  He reports having hypertension for about 10 years, mostly controlled until this year.  He recently stopped using testosterone  gel, thinking it wasn't helping much and maybe affecting his blood pressure negatively.    Blood Pressure Goal:  130/80  Current Medications: torsemide  20 mg daily prn, telmisartan  hctz 80/25 daily (am), carvedilol  6.25 mg bid, amlodipine  10 mg daily (am)  Previously tried: lisinopril  - cough  Family Hx:   father had CABG x 3 at 67 died at 67, mother had htn, multiple siblings, many with cancers, most with hypertension; no children  Social Hx:      Tobacco: quit 3.5 years ago  Alcohol: no  Caffeine: drinks coffee all day long, occasional Coke  Diet:   cook all meals at home, no prepackaged, rare frozen dinner; protein - doesn't eat much meat, more eggs and nuts; frozen vegetables; lots of pasta, brown rice   Exercise: walks regularly  Home BP readings:  home device about 3 months old (Medline), not validated  Today  was 149/82;   highest 158; few in the 130 range   Accessory Clinical Findings    Lab Results  Component Value Date   CREATININE 1.01 09/12/2024   BUN 17 09/12/2024   NA 137 09/12/2024   K 4.1 09/12/2024   CL 96 09/12/2024   CO2 24 09/12/2024   Lab Results  Component Value Date   ALT 32 08/09/2024   AST 26 08/09/2024   ALKPHOS 90 01/15/2020   BILITOT 1.1 08/09/2024   Lab Results  Component Value Date   HGBA1C 9.4 (A) 07/10/2024    Home Medications    Current Outpatient Medications  Medication Sig Dispense Refill   acetaminophen  (TYLENOL ) 650 MG CR tablet Take 650 mg by mouth as needed for pain.     albuterol  (VENTOLIN  HFA) 108 (90 Base) MCG/ACT inhaler Inhale 2 puffs into the lungs every 6 (six) hours as needed for wheezing or shortness of breath. 9 g 3   amLODipine  (NORVASC ) 10 MG tablet Take 1 tablet (10 mg total) by mouth daily. 90 tablet 3   anastrozole (ARIMIDEX) 1 MG tablet Take 1 mg by mouth 3 (three) times a week.     aspirin  81 MG EC tablet Take 81 mg by mouth daily.       atorvastatin  (LIPITOR) 40 MG tablet Take 1 tablet (40 mg total) by mouth daily. 90 tablet 3  betamethasone  dipropionate 0.05 % cream Apply topically 2 (two) times daily. 45 each 1   bictegravir-emtricitabine -tenofovir  AF (BIKTARVY ) 50-200-25 MG TABS tablet Take 1 tablet by mouth daily. 30 tablet 11   carvedilol  (COREG ) 6.25 MG tablet Take 1 tablet (6.25 mg total) by mouth 2 (two) times daily with a meal. 180 tablet 11   ciclopirox (PENLAC) 8 % solution Apply topically at bedtime. Apply over nail and surrounding skin. Apply daily over previous coat. After seven (7) days, may remove with alcohol and continue cycle.     clopidogrel  (PLAVIX ) 75 MG tablet TAKE 1 TABLET BY MOUTH EVERY DAY 90 tablet 3   clotrimazole -betamethasone  (LOTRISONE ) cream SMARTSIG:sparingly Topical Twice Daily PRN     Continuous Glucose Receiver (FREESTYLE LIBRE 2 READER) DEVI by Does not apply route.     Continuous Glucose  Sensor (FREESTYLE LIBRE 2 SENSOR) MISC by Does not apply route.     Dermatological Products, Misc. (KERASAL FUNGAL NAIL RENEWAL EX) Apply topically.     Empagliflozin -metFORMIN  HCl ER (SYNJARDY  XR) 12.01-999 MG TB24 Take 1 tablet by mouth 2 (two) times daily. 180 tablet 3   HYDROcodone -acetaminophen  (NORCO) 7.5-325 MG tablet Take 1 tablet by mouth 2 (two) times daily as needed for moderate pain (pain score 4-6). 60 tablet 0   HYDROcodone -acetaminophen  (NORCO) 7.5-325 MG tablet Take 1 tablet by mouth 2 (two) times daily as needed for moderate pain (pain score 4-6). 60 tablet 0   [START ON 10/13/2024] HYDROcodone -acetaminophen  (NORCO) 7.5-325 MG tablet Take 1 tablet by mouth 2 (two) times daily as needed for moderate pain (pain score 4-6). 60 tablet 0   insulin  aspart (FIASP  FLEXTOUCH) 100 UNIT/ML FlexTouch Pen Inject 14 Units into the skin 3 (three) times daily with meals. 15 mL 11   insulin  glargine, 2 Unit Dial, (TOUJEO  MAX SOLOSTAR) 300 UNIT/ML Solostar Pen Inject 80 Units into the skin at bedtime. 15 mL 5   Insulin  Pen Needle (BD PEN NEEDLE MICRO U/F) 32G X 6 MM MISC 1 Units by Does not apply route 4 (four) times daily. 100 each 11   loperamide (IMODIUM A-D) 2 MG tablet Take 2 mg by mouth 4 (four) times daily as needed for diarrhea or loose stools.     loratadine  (CLARITIN ) 10 MG tablet Take 1 tablet (10 mg total) by mouth daily. 90 tablet 3   Multiple Vitamins-Minerals (CENTRUM ADULT 50+ MULTIGUMMIES PO) Take by mouth.     Multiple Vitamins-Minerals (CENTRUM ADULTS PO) Take 1 tablet by mouth daily.     nitroGLYCERIN  (NITROSTAT ) 0.4 MG SL tablet PLACE 1 TABLET UNDER THE TONGUE EVERY 5 MINUTES AS NEEDED FOR CHEST PAIN 25 tablet 3   omeprazole  (PRILOSEC) 20 MG capsule TAKE 1 CAPSULE BY MOUTH AS NEEDED 90 capsule 0   tadalafil  (CIALIS ) 5 MG tablet Take 1 tablet (5 mg total) by mouth daily. 90 tablet 1   tamsulosin  (FLOMAX ) 0.4 MG CAPS capsule Take 1 capsule (0.4 mg total) by mouth daily. 90 capsule 1    telmisartan -hydrochlorothiazide  (MICARDIS  HCT) 80-25 MG tablet Take 1 tablet by mouth daily. 30 tablet 4   Testosterone  1.62 % GEL Place 2 Pump onto the skin daily. Each shoulder     Tiotropium Bromide Monohydrate  (SPIRIVA  RESPIMAT) 2.5 MCG/ACT AERS Inhale 2 puffs into the lungs daily. 4 g 11   tirzepatide  (MOUNJARO ) 7.5 MG/0.5ML Pen Inject 7.5 mg into the skin once a week. 2 mL 5   torsemide  (DEMADEX ) 20 MG tablet Take 1 tablet (20 mg total) by mouth daily  as needed (leg swelling). 30 tablet 2   No current facility-administered medications for this visit.         Assessment & Plan    Essential hypertension Assessment: BP is uncontrolled in office BP 138/76 mmHg;  above the goal (<130/80). Tolerates telmisartan  hctz 80/25 daily (am), carvedilol  6.25 mg bid, amlodipine  10 mg daily (am) well, without any side effects Denies SOB, palpitation, chest pain, headaches,or swelling  Reiterated the importance of regular exercise and low salt diet   Plan:  Move amlodipine  10 mg to evenings - starting tomorrow Continue taking telmisartan  hctz 80/25 daily (am), carvedilol  6.25 mg bid,  Patient to keep record of BP readings with heart rate and report to us  at the next visit Patient to follow up with me in 6 weeks  Labs ordered today:  none   Allean Mink PharmD CPP [facility]  [address] [place], [place] [address] [phone]       [1]  Allergies Allergen Reactions   Penicillins Hives, Nausea Only and Rash    Has patient had a PCN reaction causing immediate rash, facial/tongue/throat swelling, SOB or lightheadedness with hypotension: Yes Has patient had a PCN reaction causing severe rash involving mucus membranes or skin necrosis: No Has patient had a PCN reaction that required hospitalization: No Has patient had a PCN reaction occurring within the last 10 years: No If all of the above answers are NO, then may proceed with Cephalosporin use.     Zoloft  [Sertraline  Hcl] Other (See Comments)    Patient reported Psychomotor slowing / worsened depression   Victoza  [Liraglutide ]     Did not tolerate this medication.  Complained of dizziness even at the lowest dose   Chantix  [Varenicline ] Hives   Gabapentin  Itching   Lisinopril  Cough   Ozempic  (0.25 Or 0.5 Mg-Dose) [Semaglutide (0.25 Or 0.5mg -Dos)] Itching and Rash    Rash to chest wall.   Tresiba  [Insulin  Degludec] Rash

## 2024-09-14 ENCOUNTER — Ambulatory Visit: Admitting: Pharmacist Clinician (PhC)/ Clinical Pharmacy Specialist

## 2024-09-14 ENCOUNTER — Encounter: Payer: Self-pay | Admitting: Pharmacist Clinician (PhC)/ Clinical Pharmacy Specialist

## 2024-09-14 VITALS — BP 138/76 | HR 82

## 2024-09-14 DIAGNOSIS — I10 Essential (primary) hypertension: Secondary | ICD-10-CM

## 2024-09-14 NOTE — Patient Instructions (Signed)
 Follow up appointment: Monday January 26 at 8 am  Take your BP meds as follows:  MOVE AMLODIPINE  10 MG TO EVENINGS (START TOMORROW)  CONTINUE WITH TELMISARTAN /HCTZ IN THE MORNINGS AND CARVEDILOL  TWICE DAILY  Check your blood pressure at home daily (if able) and keep record of the readings.  Your blood pressure goal is < 130/80  To check your pressure at home you will need to:  1. Sit up in a chair, with feet flat on the floor and back supported. Do not cross your ankles or legs. 2. Rest your left arm so that the cuff is about heart level. If the cuff goes on your upper arm,  then just relax the arm on the table, arm of the chair or your lap. If you have a wrist cuff, we  suggest relaxing your wrist against your chest (think of it as Pledging the Flag with the  wrong arm).  3. Place the cuff snugly around your arm, about 1 inch above the crook of your elbow. The  cords should be inside the groove of your elbow.  4. Sit quietly, with the cuff in place, for about 5 minutes. After that 5 minutes press the power  button to start a reading. 5. Do not talk or move while the reading is taking place.  6. Record your readings on a sheet of paper. Although most cuffs have a memory, it is often  easier to see a pattern developing when the numbers are all in front of you.  7. You can repeat the reading after 1-3 minutes if it is recommended  Make sure your bladder is empty and you have not had caffeine or tobacco within the last 30 min  Always bring your blood pressure log with you to your appointments. If you have not brought your monitor in to be double checked for accuracy, please bring it to your next appointment.  You can find a list of quality blood pressure cuffs at [email or web address]  Important lifestyle changes to control high blood pressure  Intervention  Effect on the BP  Lose extra pounds and watch your waistline Weight loss is one of the most effective lifestyle changes for  controlling blood pressure. If you're overweight or obese, losing even a small amount of weight can help reduce blood pressure. Blood pressure might go down by about 1 millimeter of mercury (mm Hg) with each kilogram (about 2.2 pounds) of weight lost.  Exercise regularly As a general goal, aim for at least 30 minutes of moderate physical activity every day. Regular physical activity can lower high blood pressure by about 5 to 8 mm Hg.  Eat a healthy diet Eating a diet rich in whole grains, fruits, vegetables, and low-fat dairy products and low in saturated fat and cholesterol. A healthy diet can lower high blood pressure by up to 11 mm Hg.  Reduce salt (sodium) in your diet Even a small reduction of sodium in the diet can improve heart health and reduce high blood pressure by about 5 to 6 mm Hg.  Limit alcohol One drink equals 12 ounces of beer, 5 ounces of wine, or 1.5 ounces of 80-proof liquor.  Limiting alcohol to less than one drink a day for women or two drinks a day for men can help lower blood pressure by about 4 mm Hg.   If you have any questions or concerns please use My Chart to send questions or call the office at [phone]

## 2024-09-14 NOTE — Assessment & Plan Note (Signed)
 Assessment: BP is uncontrolled in office BP 138/76 mmHg;  above the goal (<130/80). Tolerates telmisartan  hctz 80/25 daily (am), carvedilol  6.25 mg bid, amlodipine  10 mg daily (am) well, without any side effects Denies SOB, palpitation, chest pain, headaches,or swelling  Reiterated the importance of regular exercise and low salt diet   Plan:  Move amlodipine  10 mg to evenings - starting tomorrow Continue taking telmisartan  hctz 80/25 daily (am), carvedilol  6.25 mg bid,  Patient to keep record of BP readings with heart rate and report to us  at the next visit Patient to follow up with me in 6 weeks  Labs ordered today:  none

## 2024-09-26 ENCOUNTER — Telehealth: Payer: Self-pay | Admitting: *Deleted

## 2024-09-26 DIAGNOSIS — K219 Gastro-esophageal reflux disease without esophagitis: Secondary | ICD-10-CM

## 2024-09-26 MED ORDER — OMEPRAZOLE 20 MG PO CPDR: DELAYED_RELEASE_CAPSULE | ORAL | 0 refills | Status: AC

## 2024-09-26 NOTE — Telephone Encounter (Signed)
Medication sent to pharmacy. No further action needed.

## 2024-10-02 DIAGNOSIS — J42 Unspecified chronic bronchitis: Secondary | ICD-10-CM

## 2024-10-03 ENCOUNTER — Telehealth: Payer: Self-pay | Admitting: *Deleted

## 2024-10-03 MED ORDER — ALBUTEROL SULFATE HFA 108 (90 BASE) MCG/ACT IN AERS: 2.0000 | INHALATION_SPRAY | Freq: Four times a day (QID) | RESPIRATORY_TRACT | 3 refills | Status: AC | PRN

## 2024-10-03 MED ORDER — TORSEMIDE 20 MG PO TABS: 20.0000 mg | ORAL_TABLET | Freq: Every day | ORAL | 2 refills | Status: AC | PRN

## 2024-10-03 NOTE — Telephone Encounter (Signed)
 Copied from CRM [id number]. Topic: Clinical - Prescription Issue >> Oct 03, 2024 12:17 PM DeAngela L wrote: Reason for CRM: Abram with [facility] is call cause the patient will be using the [place] 2 plus and she will fax back the paper work so it can be updated to the [place] 2 plus   [facility] Phone num [phone]

## 2024-10-03 NOTE — Telephone Encounter (Signed)
 Forwarding message to [employer].

## 2024-10-04 NOTE — Telephone Encounter (Signed)
 Per [facility], they need order to read [facility] 2 Plus sensors. They stated current order can be printed, updated, with update initialed and dated. This was done and printed copy in fax box to be faxed back to [facility].

## 2024-10-05 NOTE — Telephone Encounter (Signed)
 I faxed the form to [facility], confirmation went through. The form is scanned in the pt chart under media.

## 2024-10-06 ENCOUNTER — Other Ambulatory Visit: Payer: Self-pay

## 2024-10-06 DIAGNOSIS — B20 Human immunodeficiency virus [HIV] disease: Secondary | ICD-10-CM

## 2024-10-09 ENCOUNTER — Other Ambulatory Visit: Payer: Self-pay

## 2024-10-09 ENCOUNTER — Other Ambulatory Visit

## 2024-10-09 DIAGNOSIS — B20 Human immunodeficiency virus [HIV] disease: Secondary | ICD-10-CM

## 2024-10-10 ENCOUNTER — Other Ambulatory Visit

## 2024-10-10 LAB — T-HELPER CELL (CD4) - (RCID CLINIC ONLY)
CD4 % Helper T Cell: 44 % (ref 33–65)
CD4 T Cell Abs: 383 /uL — ABNORMAL LOW (ref 400–1790)

## 2024-10-10 NOTE — Progress Notes (Signed)
 "  Office Visit Note  Patient: Riley Jones             Date of Birth: 1956/06/18           MRN: [id number]             PCP: Rosan Dayton BROCKS, DO Referring: Rosan Dayton BROCKS, DO Visit Date: 10/11/2024   Subjective:   Discussed the use of AI scribe software for clinical note transcription with the patient, who gave verbal consent to proceed.  History of Present Illness   Riley Jones is a 69 year old male with osteoarthritis and rheumatoid arthritis who presents for evaluation of joint pain. Lab evaluation at initial visit in November showed thrombocytopenia at 93k platlets otherwise unremarkable.  He experiences worsening joint pain, particularly in the right hand, consistent with osteoarthritis. He reports pain with use in the right hand, particularly at the base of the thumb and the tip of the index finger. He reports that his left hand is less affected than his right, and he is right-handed.  He recalls previous treatment with methotrexate for rheumatoid arthritis but is unsure about other medications he may have taken. He reports that his blood tests for inflammation have been stable.  He experiences sharp neck pain when turning his head to the right, particularly when in the bathroom. The pain is localized and does not radiate down the arm or back. It is described as a 'god awful, sharp pain' that resolves after about ten minutes.  He has a history of retinal issues, including holes in his retina that required laser treatment. He regularly visits a retina specialist and has an upcoming appointment.       Previous HPI 08/09/24 Riley Jones is a 69 year old male with psoriasis and seropositive rheumatoid arthritis who presents with joint pain and skin issues.   He experiences persistent joint pain, primarily affecting his left side, including his thumb and elbow. His left elbow was previously shattered, but it does not currently cause significant pain. He has difficulty writing  due to pain in his right hand. No recent imaging of his hands or wrists has been performed. He has a history of carpal tunnel syndrome and trigger finger surgery on his right hand. Chronic back pain is present, and he describes a sensation of 'walking on marshmallows,' indicating possible neuropathy.   He has a history of psoriasis, with symptoms manifesting as skin issues on his palms, which previously split and bled. He uses a lotion that has helped prevent splitting. He has not used topical steroids for these rashes but has been using a high potency steroid, betamethasone , on his legs for a sore.   He experiences swelling in his legs, which he manages with diuretics, noting that he takes them in the morning to avoid nocturia. He wears compression socks but questions their effectiveness. He has been using ciclopirox for onychomycosis, which has helped.   He recalls being on methotrexate in the past but does not remember why it was discontinued. He has regular kidney and liver function tests due to his medication history. He does not see a rheumatologist recently but had seen [facility] clinic in the past.   He has a history of HIV, which is well-controlled and undetectable viral load. He has not experienced any HIV-related symptoms such as sores or thrush. He takes hydrocodone  occasionally for pain, about one or two pills a week, and uses Tylenol  as needed.   DMARD Hx HCQ  Leflunomide Methotrexate   Labs reviewed 02/2021 CCP >250 RF 112 ESR 4 CRP 1   2023 HAV pos   2008 HBV sAb pos HBV cAb neg   Review of Systems  Constitutional:  Negative for fatigue.  HENT:  Positive for mouth dryness. Negative for mouth sores.   Eyes:  Negative for dryness.  Respiratory:  Positive for shortness of breath.   Cardiovascular:  Positive for chest pain. Negative for palpitations.  Gastrointestinal:  Positive for constipation and diarrhea. Negative for blood in stool.  Endocrine: Negative for  increased urination.  Genitourinary:  Negative for involuntary urination.  Musculoskeletal:  Positive for joint pain, gait problem, joint pain, myalgias, morning stiffness and myalgias. Negative for joint swelling, muscle weakness and muscle tenderness.  Skin:  Positive for rash. Negative for color change, hair loss and sensitivity to sunlight.  Allergic/Immunologic: Negative for susceptible to infections.  Neurological:  Negative for dizziness and headaches.  Hematological:  Negative for swollen glands.  Psychiatric/Behavioral:  Positive for depressed mood and sleep disturbance. The patient is nervous/anxious.     PMFS History:  Patient Active Problem List   Diagnosis Date Noted   High risk medication use 08/09/2024   Diabetic retinopathy (HCC) 03/16/2023   Aortic atherosclerosis 11/12/2022   Venous reflux 08/12/2022   Low platelet count 06/09/2022   BPH (benign prostatic hyperplasia) 01/20/2022   Lung nodule 10/17/2020   Psoriasis 11/02/2019   Abdominal hernia 03/22/2019   GERD (gastroesophageal reflux disease) 12/01/2016   Chronic allergic rhinitis 08/25/2016   Drug-induced skin rash 09/16/2015   Chronic prescription opiate use 11/22/2014   CAD (coronary artery disease) 09/05/2014   Preventative health care 12/15/2012   COPD (chronic obstructive pulmonary disease) (HCC) 12/04/2012   Erectile dysfunction 10/21/2011   Rheumatoid arthritis (HCC) 02/03/2011   Hypercholesterolemia 10/11/2006   Human immunodeficiency virus (HIV) disease (HCC) 07/08/2006   Type 2 diabetes mellitus with peripheral neuropathy (HCC) 07/08/2006   Carpal tunnel syndrome 07/08/2006   Essential hypertension 07/08/2006    Past Medical History:  Diagnosis Date   Allergy    Anxiety    Arthritis    pretty much all over   Asthma    mild (07/17/2014)   Atypical angina    Myoview  2003, Normal EF 64%   CAD (coronary artery disease)    a. 10/20 DES to 80% mid LAD   Candida rash of groin 11/03/2019    Cataract    COPD (chronic obstructive pulmonary disease) (HCC)    mild (07/17/2014)   Depression    Emphysema of lung (HCC)    Fatigue 06/22/2019   Foot pain 03/30/2011   GERD (gastroesophageal reflux disease)    H/O hiatal hernia    Headache    weekly (06/2014)   History of gout    HIV (human immunodeficiency virus infection) (HCC) 1995   DX after shingles followed by Dr. Epifanio (ID)   Hx of cardiovascular stress test    ETT-Myoview  (3/16):  Ex 10:15, No Ischemia, EF 57%; Normal Study   Hyperlipidemia    Hypertension    Well Controlled off meds   Increased anion gap metabolic acidosis 01/22/2021   Kidney stones    passed them all   Left lower quadrant abdominal pain 11/02/2019   Myocardial infarction [facility])    Neuropathy    Other and unspecified angina pectoris 12/18/2008   Overview:  Overview:  Greatly appreciate PCP f/u.   Last Assessment & Plan:  - Patient with episode of CP - 4 days,  constant, substernal, not immediately resolved with NTG, felt better after burping and passing gas - Repeat EKG done here with no acute ST/T wave changes - CP now completely resolved. Encouraged patient to call 911 if recurs - To f/u with cardio in the next 2 weeks for further assess   Pneumonia 4-5 times   Psoriasis 2024   Rheumatoid arthritis(714.0)    Followed by Dr. Everlean, off MTX and prdnisone since 2007   Right hip pain 04/25/2019   Right shoulder pain 01/19/2018   Shingles (herpes zoster) polyneuropathy 11/08/2018   Sleep apnea    does not wear mask (07/17/2014)   Type II diabetes mellitus (HCC)    Ulcer     Family History  Problem Relation Age of Onset   Cancer Mother    Osteoporosis Mother    Diabetes Mother    Heart disease Father    Heart attack Father    Diabetes Father    COPD Father    Brain cancer Sister    Breast cancer Sister    Obesity Sister    Cancer Brother        Prostate   Heart attack Brother    Cancer Brother        asophagas   Psoriasis  Brother    Stroke Maternal Grandmother    Stroke Maternal Grandfather    Stroke Paternal Grandmother    Stroke Paternal Grandfather    Past Surgical History:  Procedure Laterality Date   APPENDECTOMY  ~ 1981   CARPAL TUNNEL RELEASE Left 1990's   CORONARY ANGIOPLASTY WITH STENT PLACEMENT  07/17/2014   a. DES to 80% mid LAD   HERNIA REPAIR     LAPAROSCOPIC INCISIONAL / UMBILICAL / VENTRAL HERNIA REPAIR  1990's   it was a double; not inguinal   LEFT HEART CATHETERIZATION WITH CORONARY ANGIOGRAM N/A 07/17/2014   Procedure: LEFT HEART CATHETERIZATION WITH CORONARY ANGIOGRAM;  Surgeon: Maude JAYSON Emmer, MD;  Location: [facility] CATH LAB;  Service: Cardiovascular;  Laterality: N/A;   PERCUTANEOUS STENT INTERVENTION  07/17/2014   Procedure: PERCUTANEOUS STENT INTERVENTION;  Surgeon: Maude JAYSON Emmer, MD;  Location[facility] CATH LAB;  Service: Cardiovascular;;   TONSILLECTOMY AND ADENOIDECTOMY  1990's   TRIGGER FINGER RELEASE Left 1990's   2nd & 5th digits   UMBILICAL HERNIA REPAIR  1980's   w/mesh   UVULOPALATOPHARYNGOPLASTY (UPPP)/TONSILLECTOMY/SEPTOPLASTY  1990's   Social History   Social History Narrative   [place] approved beginning 12/11/2009 - 12/27/2010   [employer] benefits approved; patient eligible for 100% discount for out patient labs and office visits. Patient eligible for 70% discount for other services per Heron Goodell 09/08/2010      Patient is on disability, and walks 2-3 times per week.    Immunization History  Administered Date(s) Administered   Fluad Quad(high Dose 65+) 06/09/2019, 05/27/2021, 05/15/2023   Hepatitis A 12/12/2007, 12/19/2008   INFLUENZA, HIGH DOSE SEASONAL PF 05/10/2020, 05/05/2022, 05/26/2024   Influenza Split 06/09/2012, 05/31/2014, 05/27/2021   Influenza Whole 06/07/2006, 07/19/2007, 06/27/2008, 07/24/2009, 06/26/2010, 06/09/2011   Influenza,inj,Quad PF,6+ Mos 05/22/2013, 04/29/2016, 05/06/2017, 07/04/2018   Influenza,inj,quad, With Preservative 05/07/2015    Influenza-Unspecified 05/30/2014, 05/07/2015, 11/13/2015   MMR 01/09/2024   PFIZER Comirnaty(Gray Top)Covid-19 Tri-Sucrose Vaccine 01/28/2021   PFIZER(Purple Top)SARS-COV-2 Vaccination 10/21/2019, 11/11/2019, 05/24/2020, 07/05/2021   Pfizer Covid-19 Vaccine Bivalent Booster 11yrs & up 07/05/2021   Pneumococcal Conjugate-13 02/25/2016   Pneumococcal Polysaccharide-23 06/07/2006, 06/29/2011, 08/08/2019   Respiratory Syncytial Virus Vaccine,Recomb Aduvanted(Arexvy) 07/23/2022   Td 12/04/2009  Tdap 06/14/2020   Zoster Recombinant(Shingrix) 05/05/2019, 12/15/2019   Zoster, Live 07/06/2014     Objective: Vital Signs: BP 135/73   Pulse 79   Temp 98 F (36.7 C)   Resp 16   Ht 5' 10 (1.778 m)   Wt 219 lb 3.2 oz (99.4 kg)   BMI 31.45 kg/m    Physical Exam Eyes:     Conjunctiva/sclera: Conjunctivae normal.  Cardiovascular:     Rate and Rhythm: Normal rate and regular rhythm.  Pulmonary:     Effort: Pulmonary effort is normal.     Breath sounds: Normal breath sounds.  Lymphadenopathy:     Cervical: No cervical adenopathy.  Skin:    General: Skin is warm and dry.     Findings: Rash present.     Comments: Patch of erythematous rash with some overlying scale on left lateral ankle Right palmar rash with white/flaky overlying skin no erythema or pustules  Neurological:     Mental Status: He is alert.  Psychiatric:        Mood and Affect: Mood normal.      Musculoskeletal Exam:  Severely tender and guarding on right side neck muscles, some radiation/extension down to medial border of scapula Shoulders full ROM no tenderness or swelling Elbows full ROM, slightly limited extension ROM, tenderness to pressure no palpable swelling Wrists full ROM no tenderness or swelling Fingers full ROM, right first MCP tenderness Knees full ROM no tenderness or swelling   Investigation: No additional findings.  Imaging: No results found.  Recent Labs: Lab Results  Component Value  Date   WBC 5.7 10/09/2024   HGB 14.9 10/09/2024   PLT 85 (L) 10/09/2024   NA 135 10/09/2024   K 4.5 10/09/2024   CL 96 (L) 10/09/2024   CO2 32 10/09/2024   GLUCOSE 161 (H) 10/09/2024   BUN 20 10/09/2024   CREATININE 1.00 10/09/2024   BILITOT 0.9 10/09/2024   ALKPHOS 90 01/15/2020   AST 22 10/09/2024   ALT 33 10/09/2024   PROT 7.4 10/09/2024   ALBUMIN 4.4 01/15/2020   CALCIUM  10.7 (H) 10/09/2024   GFRAA 93 02/12/2020    Speciality Comments: PLQ Eye Exam:  10/18/2024 WNL @ [facility] Follow up in 1 year  Procedures:  No procedures performed Allergies: Penicillins, Zoloft  [sertraline  hcl], Victoza  [liraglutide ], Chantix  [varenicline ], Gabapentin , Lisinopril , Ozempic  (0.25 or 0.5 mg-dose) [semaglutide (0.25 or 0.5mg -dos)], and Tresiba  [insulin  degludec]   Assessment / Plan:     Visit Diagnoses:  Assessment & Plan Rheumatoid arthritis involving both hands with positive rheumatoid factor (HCC) Carpal tunnel syndrome of right wrist Degenerative changes in right hand, particularly at index finger tip and thumb base. Less advanced changes in left hand. No significant active synovitis appreciable. Symptoms align with osteoarthritis. Methotrexate not preferred due to side effects, would favor considering HCQ trial given HIV Hx to limit immunosuppression as well. - Consider Plaquenil  pending ophthalmologist's approval. - Provided information about Plaquenil  and eye exam form. - Discuss with ophthalmologist regarding Plaquenil  suitability.    Human immunodeficiency virus (HIV) disease (HCC) Consideration to limited aggressive immunosuppression as above. Well controlled with ID f/u.    Type 2 diabetes mellitus with peripheral neuropathy (HCC) Aim to limited high dose or long term steroid exposure if possible with known DM and complications.       Cervicalgia with increased muscle tone Intermittent sharp neck pain on right side with increased muscle tone. Possible facet joint or  disc involvement. - Provided exercise diagrams for neck  exercises.        Follow-Up Instructions: Return in about 3 months (around 01/09/2025) for RA ?HCQ restart f/u 3mos.   Lonni LELON Ester, MD  Note - This record has been created using [employer].  Chart creation errors have been sought, but may not always  have been located. Such creation errors do not reflect on  the standard of medical care. "

## 2024-10-11 ENCOUNTER — Ambulatory Visit: Attending: Internal Medicine | Admitting: Internal Medicine

## 2024-10-11 ENCOUNTER — Encounter: Payer: Self-pay | Admitting: Internal Medicine

## 2024-10-11 VITALS — BP 135/73 | HR 79 | Temp 98.0°F | Resp 16 | Ht 70.0 in | Wt 219.2 lb

## 2024-10-11 DIAGNOSIS — B20 Human immunodeficiency virus [HIV] disease: Secondary | ICD-10-CM

## 2024-10-11 DIAGNOSIS — M05742 Rheumatoid arthritis with rheumatoid factor of left hand without organ or systems involvement: Secondary | ICD-10-CM

## 2024-10-11 DIAGNOSIS — M05741 Rheumatoid arthritis with rheumatoid factor of right hand without organ or systems involvement: Secondary | ICD-10-CM

## 2024-10-11 DIAGNOSIS — E1142 Type 2 diabetes mellitus with diabetic polyneuropathy: Secondary | ICD-10-CM

## 2024-10-11 DIAGNOSIS — G5601 Carpal tunnel syndrome, right upper limb: Secondary | ICD-10-CM

## 2024-10-11 LAB — CBC WITH DIFFERENTIAL/PLATELET
Absolute Lymphocytes: 866 {cells}/uL (ref 850–3900)
Absolute Monocytes: 559 {cells}/uL (ref 200–950)
Basophils Absolute: 68 {cells}/uL (ref 0–200)
Basophils Relative: 1.2 %
Eosinophils Absolute: 160 {cells}/uL (ref 15–500)
Eosinophils Relative: 2.8 %
HCT: 45.2 % (ref 39.4–51.1)
Hemoglobin: 14.9 g/dL (ref 13.2–17.1)
MCH: 28.4 pg (ref 27.0–33.0)
MCHC: 33 g/dL (ref 31.6–35.4)
MCV: 86.1 fL (ref 81.4–101.7)
MPV: 11 fL (ref 7.5–12.5)
Monocytes Relative: 9.8 %
Neutro Abs: 4047 {cells}/uL (ref 1500–7800)
Neutrophils Relative %: 71 %
Platelets: 85 Thousand/uL — ABNORMAL LOW (ref 140–400)
RBC: 5.25 Million/uL (ref 4.20–5.80)
RDW: 15.8 % — ABNORMAL HIGH (ref 11.0–15.0)
Total Lymphocyte: 15.2 %
WBC: 5.7 Thousand/uL (ref 3.8–10.8)

## 2024-10-11 LAB — COMPLETE METABOLIC PANEL WITHOUT GFR
AG Ratio: 1.6 (calc) (ref 1.0–2.5)
ALT: 33 U/L (ref 9–46)
AST: 22 U/L (ref 10–35)
Albumin: 4.6 g/dL (ref 3.6–5.1)
Alkaline phosphatase (APISO): 88 U/L (ref 35–144)
BUN: 20 mg/dL (ref 7–25)
CO2: 32 mmol/L (ref 20–32)
Calcium: 10.7 mg/dL — ABNORMAL HIGH (ref 8.6–10.3)
Chloride: 96 mmol/L — ABNORMAL LOW (ref 98–110)
Creat: 1 mg/dL (ref 0.70–1.28)
Globulin: 2.8 g/dL (ref 1.9–3.7)
Glucose, Bld: 161 mg/dL — ABNORMAL HIGH (ref 65–99)
Potassium: 4.5 mmol/L (ref 3.5–5.3)
Sodium: 135 mmol/L (ref 135–146)
Total Bilirubin: 0.9 mg/dL (ref 0.2–1.2)
Total Protein: 7.4 g/dL (ref 6.1–8.1)

## 2024-10-11 LAB — HIV-1 RNA QUANT-NO REFLEX-BLD
HIV 1 RNA Quant: NOT DETECTED {copies}/mL
HIV-1 RNA Quant, Log: NOT DETECTED {Log_copies}/mL

## 2024-10-11 NOTE — Patient Instructions (Signed)
 Stretching and range-of-motion exercises Cervical side bending  Using good posture, sit on a stable chair or stand up. Without moving your shoulders, slowly tilt your left / right ear to your shoulder until you feel a stretch in the neck muscles on the opposite side. You should be looking straight ahead. Hold for __________ seconds. Repeat with the other side of your neck. Repeat __________ times. Complete this exercise __________ times a day. Cervical rotation  Using good posture, sit on a stable chair or stand up. Slowly turn your head to the side as if you are looking over your left / right shoulder. Keep your eyes level with the ground. Stop when you feel a stretch along the side and the back of your neck. Hold for __________ seconds. Repeat this by turning to your other side. Repeat __________ times. Complete this exercise __________ times a day. Thoracic extension and pectoral stretch  Roll a towel or a small blanket so it is about 4 inches (10 cm) in diameter. Lie down on your back on a firm surface. Put the towel in the middle of your back across your spine. It should not be under your shoulder blades. Put your hands behind your head and let your elbows fall out to your sides. Hold for __________ seconds. Repeat __________ times. Complete this exercise __________ times a day. Strengthening exercises Upper cervical flexion  Lie on your back with a thin pillow behind your head or a small, rolled-up towel under your neck. Gently tuck your chin toward your chest and nod your head down to look toward your feet. Do not lift your head off the pillow. Hold for __________ seconds. Release the tension slowly. Relax your neck muscles completely before you repeat this exercise. Repeat __________ times. Complete this exercise __________ times a day.

## 2024-10-17 ENCOUNTER — Other Ambulatory Visit: Payer: Self-pay | Admitting: Cardiovascular Disease

## 2024-10-23 ENCOUNTER — Ambulatory Visit: Admitting: Pharmacist Clinician (PhC)/ Clinical Pharmacy Specialist

## 2024-10-24 ENCOUNTER — Other Ambulatory Visit (HOSPITAL_COMMUNITY): Payer: Self-pay

## 2024-10-24 ENCOUNTER — Other Ambulatory Visit: Payer: Self-pay

## 2024-10-24 ENCOUNTER — Telehealth: Admitting: Internal Medicine

## 2024-10-24 ENCOUNTER — Encounter: Payer: Self-pay | Admitting: Internal Medicine

## 2024-10-24 DIAGNOSIS — E785 Hyperlipidemia, unspecified: Secondary | ICD-10-CM

## 2024-10-24 DIAGNOSIS — E291 Testicular hypofunction: Secondary | ICD-10-CM

## 2024-10-24 DIAGNOSIS — B20 Human immunodeficiency virus [HIV] disease: Secondary | ICD-10-CM

## 2024-10-24 MED ORDER — BICTEGRAVIR-EMTRICITAB-TENOFOV 50-200-25 MG PO TABS: 1.0000 | ORAL_TABLET | Freq: Every day | ORAL | 11 refills | Status: AC

## 2024-10-24 MED ORDER — FIASP FLEXTOUCH 100 UNIT/ML ~~LOC~~ SOPN: 14.0000 [IU] | PEN_INJECTOR | Freq: Three times a day (TID) | SUBCUTANEOUS | 11 refills | Status: AC

## 2024-10-24 NOTE — Telephone Encounter (Signed)
 Medication sent to pharmacy

## 2024-10-24 NOTE — Telephone Encounter (Signed)
 In accordance with refill protocols, please review and address the following requirements before this medication refill can be authorized:  Labs

## 2024-10-24 NOTE — Progress Notes (Addendum)
 "       [facility]   CHIEF COMPLAINT    HIV follow up.   SUBJECTIVE:    Riley Jones is a 69 y.o. male with PMHx as below who presents to the clinic for HIV follow up.    10/24/24 id clinic f/u Patient had spiriva  added for his breathing, about a month prior to this visit. He doesn't feel the need to use every day. But since using that, his breathing and exertion is better. He is restarting working again and to do first day today 10/24/24 I reviewed his recent cardiology visit and no active issue He has some intentional weight loss -- at 215 pounds currently Reviewed labs -- calcium  mildly elevated the last year Taking testosterone  gel -- but haven't taken the last 2 months and seems the blood pressure pressure Viral load undetectable Cd4 percentage 44%   Social- Working at [employer] Prior smoker quit 2021 April 5th No alcohol or drug issue Lives alone; no family in [place]  (all in [place] ) [place]oved from [place]  to [place] 1999 No children and no prior marriage  03/28/24 id clinic f/u He is doing well on biktarvy ; no missed dose Dyspnea chronic beign w/u'ed with pcp See a&p   06/29/23 id clinic f/u Patient previously sees dr Prentiss. First visit with me today  Saw ed 3 weeks ago for acute back pain given methylprednisone burst and flexeril  -- twisted his back jumping back to avoid an object falling on him at work. Still painful not better. No lower ext neurological deficit  Has chronic left lower abd pain due to ozempic  injection  He has no complaints Labs recently done and we reviewed Reviewed meds/medical problems -- dm2, RA, angina/cad Please see A&P for the details of today's visit and status of the patient's medical problems.   Lives alone He has 2 dog pets 03/28/24 he just bought a trailer but yet to use (retired 11/12/23) Not in a relationship -- last sexually active long time. 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   ALBUTEROL  (VENTOLIN  HFA) 108 (90 BASE) MCG/ACT INHALER    Inhale 2 puffs into the lungs every 6 (six) hours as needed for wheezing or shortness of breath.   AMLODIPINE  (NORVASC ) 10 MG TABLET    Take 1 tablet (10 mg total) by mouth daily.   ANASTROZOLE (ARIMIDEX) 1 MG TABLET    Take 1 mg by mouth 3 (three) times a week.   ASPIRIN  81 MG EC TABLET    Take 81 mg by mouth daily.     ATORVASTATIN  (LIPITOR) 40 MG TABLET    Take 1 tablet (40 mg total) by mouth daily.   BETAMETHASONE  DIPROPIONATE 0.05 % CREAM    Apply topically 2 (two) times daily.   BICTEGRAVIR-EMTRICITABINE -TENOFOVIR  AF (BIKTARVY ) 50-200-25 MG TABS TABLET    Take 1 tablet by mouth daily.   CARVEDILOL  (COREG ) 6.25 MG TABLET    Take 1 tablet (6.25 mg total) by mouth 2 (two) times daily with a meal.   CICLOPIROX (PENLAC) 8 % SOLUTION    Apply topically at bedtime. Apply over nail and surrounding skin. Apply daily over previous coat.  After seven (7) days, may remove with alcohol and continue cycle.   CLOPIDOGREL  (PLAVIX ) 75 MG TABLET    TAKE 1 TABLET BY MOUTH EVERY DAY   CLOTRIMAZOLE -BETAMETHASONE  (LOTRISONE ) CREAM    SMARTSIG:sparingly Topical Twice Daily PRN   CONTINUOUS GLUCOSE RECEIVER (FREESTYLE LIBRE 2 READER) DEVI    by Does not apply route.   CONTINUOUS GLUCOSE SENSOR (FREESTYLE LIBRE 2 SENSOR) MISC    by Does not apply route.   DERMATOLOGICAL PRODUCTS, MISC. (KERASAL FUNGAL NAIL RENEWAL EX)    Apply topically.   EMPAGLIFLOZIN -METFORMIN  HCL ER (SYNJARDY  XR) 12.01-999 MG TB24    Take 1 tablet by mouth 2 (two) times daily.   HYDROCODONE -ACETAMINOPHEN  (NORCO) 7.5-325 MG TABLET    Take 1 tablet by mouth 2 (two) times daily as needed for moderate pain (pain score 4-6).    HYDROCODONE -ACETAMINOPHEN  (NORCO) 7.5-325 MG TABLET    Take 1 tablet by mouth 2 (two) times daily as needed for moderate pain (pain score 4-6).   HYDROCODONE -ACETAMINOPHEN  (NORCO) 7.5-325 MG TABLET    Take 1 tablet by mouth 2 (two) times daily as needed for moderate pain (pain score 4-6).   INSULIN  ASPART (FIASP  FLEXTOUCH) 100 UNIT/ML FLEXTOUCH PEN    Inject 14 Units into the skin 3 (three) times daily with meals.   INSULIN  GLARGINE, 2 UNIT DIAL, (TOUJEO  MAX SOLOSTAR) 300 UNIT/ML SOLOSTAR PEN    Inject 80 Units into the skin at bedtime.   INSULIN  PEN NEEDLE (BD PEN NEEDLE MICRO U/F) 32G X 6 MM MISC    1 Units by Does not apply route 4 (four) times daily.   LOPERAMIDE (IMODIUM A-D) 2 MG TABLET    Take 2 mg by mouth 4 (four) times daily as needed for diarrhea or loose stools.   LORATADINE  (CLARITIN ) 10 MG TABLET    Take 1 tablet (10 mg total) by mouth daily.   MULTIPLE VITAMINS-MINERALS (CENTRUM ADULT 50+ MULTIGUMMIES PO)    Take by mouth.   MULTIPLE VITAMINS-MINERALS (CENTRUM ADULTS PO)    Take 1 tablet by mouth daily.   NITROGLYCERIN  (NITROSTAT ) 0.4 MG SL TABLET    PLACE 1 TABLET UNDER THE TONGUE EVERY 5 MINUTES AS NEEDED FOR CHEST PAIN   OMEPRAZOLE  (PRILOSEC) 20 MG CAPSULE    TAKE 1 CAPSULE BY MOUTH AS NEEDED   TADALAFIL  (CIALIS ) 5 MG TABLET    Take 1 tablet (5 mg total) by mouth daily.   TAMSULOSIN  (FLOMAX ) 0.4 MG CAPS CAPSULE    Take 1 capsule (0.4 mg total) by mouth daily.   TELMISARTAN -HYDROCHLOROTHIAZIDE  (MICARDIS  HCT) 80-25 MG TABLET    Take 1 tablet by mouth daily.   TESTOSTERONE  1.62 % GEL    Place 2 Pump onto the skin daily. Each shoulder   TIOTROPIUM BROMIDE MONOHYDRATE  (SPIRIVA  RESPIMAT) 2.5 MCG/ACT AERS    Inhale 2 puffs into the lungs daily.   TIRZEPATIDE  (MOUNJARO ) 7.5 MG/0.5ML PEN    Inject 7.5 mg into the skin once a week.   TORSEMIDE  (DEMADEX ) 20 MG TABLET    Take 1 tablet (20 mg total) by mouth daily as needed (leg swelling).  Modified Medications   No medications on file   Discontinued Medications   No medications on file      Past Medical History:  Diagnosis Date   Allergy    Anxiety    Arthritis    pretty much all over   Asthma    mild (07/17/2014)   Atypical angina    Myoview  2003, Normal EF 64%  CAD (coronary artery disease)    a. 10/20 DES to 80% mid LAD   Candida rash of groin 11/03/2019   Cataract    COPD (chronic obstructive pulmonary disease) (HCC)    mild (07/17/2014)   Depression    Emphysema of lung (HCC)    Fatigue 06/22/2019   Foot pain 03/30/2011   GERD (gastroesophageal reflux disease)    H/O hiatal hernia    Headache    weekly (06/2014)   History of gout    HIV (human immunodeficiency virus infection) (HCC) 1995   DX after shingles followed by Dr. Epifanio (ID)   Hx of cardiovascular stress test    ETT-Myoview  (3/16):  Ex 10:15, No Ischemia, EF 57%; Normal Study   Hyperlipidemia    Hypertension    Well Controlled off meds   Increased anion gap metabolic acidosis 01/22/2021   Kidney stones    passed them all   Left lower quadrant abdominal pain 11/02/2019   Myocardial infarction [facility])    Neuropathy    Other and unspecified angina pectoris 12/18/2008   Overview:  Overview:  Greatly appreciate PCP f/u.   Last Assessment & Plan:  - Patient with episode of CP - 4 days, constant, substernal, not immediately resolved with NTG, felt better after burping and passing gas - Repeat EKG done here with no acute ST/T wave changes - CP now completely resolved. Encouraged patient to call 911 if recurs - To f/u with cardio in the next 2 weeks for further assess   Pneumonia 4-5 times   Psoriasis 2024   Rheumatoid arthritis(714.0)    Followed by Dr. Everlean, off MTX and prdnisone since 2007   Right hip pain 04/25/2019   Right shoulder pain 01/19/2018   Shingles (herpes zoster) polyneuropathy 11/08/2018   Sleep apnea    does not wear mask (07/17/2014)   Type II diabetes mellitus (HCC)    Ulcer     Social History    Tobacco Use   Smoking status: Former    Current packs/day: 0.00    Average packs/day: 1 pack/day for 46.0 years (46.0 ttl pk-yrs)    Types: Cigarettes    Start date: 12/28/1975    Quit date: 12/27/2021    Years since quitting: 2.8    Passive exposure: Past   Smokeless tobacco: Never  Vaping Use   Vaping status: Never Used  Substance Use Topics   Alcohol use: Not Currently    Alcohol/week: 1.0 standard drink of alcohol    Types: 1 Glasses of wine per week    Comment: 07/17/2014 glass of wine once or twice/month   Drug use: No    Family History  Problem Relation Age of Onset   Cancer Mother    Osteoporosis Mother    Diabetes Mother    Heart disease Father    Heart attack Father    Diabetes Father    COPD Father    Brain cancer Sister    Breast cancer Sister    Obesity Sister    Cancer Brother        Prostate   Heart attack Brother    Cancer Brother        asophagas   Psoriasis Brother    Stroke Maternal Grandmother    Stroke Maternal Grandfather    Stroke Paternal Grandmother    Stroke Paternal Grandfather     Allergies  Allergen Reactions   Penicillins Hives, Nausea Only and Rash    Has patient had a PCN reaction causing  immediate rash, facial/tongue/throat swelling, SOB or lightheadedness with hypotension: Yes Has patient had a PCN reaction causing severe rash involving mucus membranes or skin necrosis: No Has patient had a PCN reaction that required hospitalization: No Has patient had a PCN reaction occurring within the last 10 years: No If all of the above answers are NO, then may proceed with Cephalosporin use.    Zoloft  [Sertraline  Hcl] Other (See Comments)    Patient reported Psychomotor slowing / worsened depression   Victoza  [Liraglutide ]     Did not tolerate this medication.  Complained of dizziness even at the lowest dose   Chantix  [Varenicline ] Hives   Gabapentin  Itching   Lisinopril  Cough   Ozempic  (0.25 Or 0.5 Mg-Dose) [Semaglutide (0.25 Or  0.5mg -Dos)] Itching and Rash    Rash to chest wall.   Tresiba  [Insulin  Degludec] Rash    ROS: All other ros negative   OBJECTIVE:    There were no vitals filed for this visit.    Televideo visit  Labs and Microbiology:    Latest Ref Rng & Units 10/09/2024    8:36 AM 09/12/2024   12:19 PM 08/09/2024   10:25 AM  CMP  Glucose 65 - 99 mg/dL 838  705  878   BUN 7 - 25 mg/dL 20  17  22    Creatinine 0.70 - 1.28 mg/dL 8.99  8.98  9.14   Sodium 135 - 146 mmol/L 135  137  141   Potassium 3.5 - 5.3 mmol/L 4.5  4.1  3.9   Chloride 98 - 110 mmol/L 96  96  102   CO2 20 - 32 mmol/L 32  24  26   Calcium  8.6 - 10.3 mg/dL 89.2  89.5  9.8   Total Protein 6.1 - 8.1 g/dL 7.4   7.3   Total Bilirubin 0.2 - 1.2 mg/dL 0.9   1.1   AST 10 - 35 U/L 22   26   ALT 9 - 46 U/L 33   32       Latest Ref Rng & Units 10/09/2024    8:36 AM 08/09/2024   10:25 AM 03/27/2024   10:00 AM  CBC  WBC 3.8 - 10.8 Thousand/uL 5.7  5.4  5.2   Hemoglobin 13.2 - 17.1 g/dL 85.0  85.7  85.7   Hematocrit 39.4 - 51.1 % 45.2  43.0  43.6   Platelets 140 - 400 Thousand/uL 85  93  124      Lab Results  Component Value Date   HIV1RNAQUANT NOT DETECTED 10/09/2024   HIV1RNAQUANT <20 DETECTED (A) 03/13/2024   HIV1RNAQUANT 45 (H) 06/15/2023   CD4TABS 383 (L) 10/09/2024   CD4TABS 583 06/15/2023   CD4TABS 704 05/26/2022    RPR and STI: Lab Results  Component Value Date   LABRPR NON-REACTIVE 03/13/2024   LABRPR NON-REACTIVE 06/15/2023   LABRPR NON-REACTIVE 05/26/2022   LABRPR NON-REACTIVE 05/16/2021   LABRPR NON-REACTIVE 06/11/2020    STI Results GC CT  06/15/2023  8:50 AM Negative  Negative   05/26/2022  9:30 AM Negative  Negative   08/08/2021 10:11 AM Negative  Negative   05/16/2021  2:34 PM Negative  Negative   06/11/2020  9:22 AM Negative  Negative   10/25/2018 12:00 AM Negative  Negative   01/19/2018 12:00 AM Negative  Negative   06/16/2017 12:00 AM Negative  Negative   03/02/2016 12:00 AM Negative   Negative     Hepatitis B: Lab Results  Component Value Date   HEPBSAB YES  11/22/2006   HEPBSAG NO 11/22/2006   Hepatitis C: Lab Results  Component Value Date   HEPCAB NON-REACTIVE 08/09/2024   Hepatitis A: Lab Results  Component Value Date   HAV REACTIVE (A) 01/30/2022   Lipids: Lab Results  Component Value Date   CHOL 169 03/13/2024   TRIG 396 (H) 03/13/2024   HDL 40 03/13/2024   CHOLHDL 4.2 03/13/2024   VLDL UNABLE TO CALCULATE IF TRIGLYCERIDE OVER 400 mg/dL 93/98/7981   LDLCALC 81 03/13/2024    Imaging: Reviewed:  03/2020 abd pelv ct without contrast FINDINGS: Lower chest: Lung bases are clear. There are foci of arterial vascular calcification. There is mitral annulus calcification.   Hepatobiliary: No focal liver lesions are evident. There is cholelithiasis. There is no appreciable gallbladder wall thickening. No biliary duct dilatation.   Pancreas: There is no pancreatic mass or inflammatory focus.   Spleen: No splenic lesions are evident.   Adrenals/Urinary Tract: Adrenals bilaterally appear normal. Kidneys bilaterally show no evident mass or hydronephrosis on either side. There is no evident renal or ureteral calculus on either side. Urinary bladder is midline with wall thickness within normal limits.   Stomach/Bowel: There is no appreciable bowel wall or mesenteric thickening. Terminal ileum appears normal. There is mild lipomatous infiltration of the ileocecal valve. No evident bowel obstruction. There are occasional sigmoid diverticula without diverticulitis. No free air or portal venous air.   Vascular/Lymphatic: There is no abdominal aortic aneurysm. There is aortic and iliac artery atherosclerosis. No adenopathy is evident in the abdomen or pelvis.   Reproductive: Prostate is upper normal in size with configuration within normal limits. There are a few tiny prostatic calculi. Seminal vesicles appear normal bilaterally. No evident pelvic  mass.   Other: Postoperative mesh noted in the anterior pelvis. There is fat in each inguinal ring. Appendix absent. No periappendiceal region inflammation evident. No evident abscess or ascites in the abdomen or pelvis.   Musculoskeletal: There is degenerative change in the lumbar spine. There are no blastic or lytic bone lesions. No intramuscular lesions are evident.   IMPRESSION: 1.  Cholelithiasis.  No gallbladder wall thickening evident.   2. Occasional sigmoid diverticula without diverticulitis. No bowel wall thickening or bowel obstruction. No free air.   3. No renal or ureteral calculus. No hydronephrosis. Urinary bladder wall thickness normal.   4. Postoperative change anterior pelvic wall. Fat noted in each inguinal ring.   5. Aortic Atherosclerosis (ICD10-I70.0). Multiple iliac artery calcifications also noted. Foci of coronary artery calcification noted.   ASSESSMENT & PLAN:     Problem List Items Addressed This Visit   None Visit Diagnoses       HIV disease (HCC)    -  Primary   Relevant Medications   bictegravir-emtricitabine -tenofovir  AF (BIKTARVY ) 50-200-25 MG TABS tablet     Hypercalcemia         Hyperlipidemia, unspecified hyperlipidemia type         Androgen deficiency             #hiv #cad risk on atorvastatin  40 mg daily Dx'ed 1995 -- question of dental procedure transmission (dentist was arrested for infecting patients). No ivdu; no msm risk.  Cd4 < 200 at dx; frequent pneumonia and hx pcp Started treatment right at dx Therapy: 2021-c Biktarvy  Didn't like cabenuva  due to side effect Prior atripla  and other regimens he doesn't remember No history of regimen failure previously per his report   09/2024 Compliant no missed dose last 4 weeks. Hiv viral  load undetectable   -recheck with patient to make sure he is still taking atorvastatin  40 mg daily -discussed u=u -encourage compliance -continue current HIV medication with  biktarvy  -labs December 2026 and f/u then   #hypercalcemia  Mild -- since 08/2024  No known hx cancer  On hydrochlorothiazide  and taking mvi... possibly contribute with hydrochlorothiazide . However will need to look for other causes such as primary hyperparathyroidism  Assymptomatic   -f/u pcp   #androgen deficiency  He stopped testosterone  a few months prior to 09/2024 visit said bp better Will make sure he stop anastrozole as well    #dyspnea/chest pain/decreased energy Months of sx A1c really high 14 At risk for CAD though Recent 08/2024 visit cardiology no active cardiac issue of concern Recently 08/2024 started spiriva  and doing better  -continue aspirin /statin/htn meds    #RA Been off specific meds for years No morning stiffness and no significant joint pain/swelling  -f/u rheumatologist    #Psorasis (confirmed bx left leg biopsy) -- nails are involved On topical agents Left lateral tib/fib-ankle junction a skin flare   -continue prednisone  cream    #dm2 #obesity #peripheral neuropathy (feet) On insulin  and ozempic  (since 2022) and synjardi Doing better with a1c Lab Results  Component Value Date   HGBA1C 9.4 (A) 07/10/2024   -f/u pcp   #social Unchanged outside of going back to [employer] to work as of 10/24/24 as he is doing better with pulmonary function    #hcm -vaccination Flu shot utd Prevnar13 2017 Tdap 2021 Shingrix 2021 -hepatitis Low risk hep b infection; will defer testing -std screen 05/2023 urine gc/chlam negative 02/2024 rpr negative Deferred not sexually active -cancer screening F/u pcp age-appropriate cancer screening Pcp is dr [facility]   Return in about 11 months (around 09/23/2025).   Constance DASEN [facility] 10/24/2024, 10:25 AM   I connected with  Victory JINNY Hacker on 10/24/2024  I verified that I was speaking with the correct person using two  identifiers. Due to the adverse weather, this service was provided via telemedicine using audio/visual media.   The patient was located at home. The provider was located in the office. The patient did consent to this visit and is aware of charges through their insurance as well as the limitations of evaluation and management by telemedicine. Other persons participating in this telemedicine service were none. Time spent on visit was greater than 35 minutes on media and in coordination of care         "

## 2024-10-27 MED ORDER — HYDROXYCHLOROQUINE SULFATE 200 MG PO TABS: 200.0000 mg | ORAL_TABLET | Freq: Every day | ORAL | 0 refills | Status: AC

## 2024-10-27 NOTE — Assessment & Plan Note (Signed)
 Consideration to limited aggressive immunosuppression as above. Well controlled with ID f/u.

## 2024-10-27 NOTE — Addendum Note (Signed)
 Addended by: JEANNETTA LONNI ORN on: 10/27/2024 05:18 PM   Modules accepted: Orders

## 2024-10-27 NOTE — Assessment & Plan Note (Signed)
 Aim to limited high dose or long term steroid exposure if possible with known DM and complications.

## 2024-10-30 ENCOUNTER — Ambulatory Visit: Admitting: Pharmacist Clinician (PhC)/ Clinical Pharmacy Specialist

## 2024-10-30 ENCOUNTER — Telehealth: Payer: Self-pay

## 2024-10-30 NOTE — Telephone Encounter (Signed)
-----   Message from Lonni Ester, MD sent at 10/27/2024  5:17 PM EST ----- Reviewed eye exam from January is okay to start on hydroxychloroquine  as discussed. Initial dose is 200 mg once daily and we can f/u at scheduled appointment in April.

## 2024-10-30 NOTE — Telephone Encounter (Signed)
 Called patient and advised of information below. Patient states he is going to hold off on starting plaquenil  until he has a dye test this week for his retina.

## 2025-01-09 ENCOUNTER — Ambulatory Visit: Admitting: Internal Medicine

## 2025-04-11 ENCOUNTER — Ambulatory Visit

## 2025-08-28 ENCOUNTER — Ambulatory Visit: Payer: Self-pay | Admitting: Internal Medicine
